# Patient Record
Sex: Female | Born: 1950 | Race: White | Hispanic: No | Marital: Married | Smoking: Never smoker
Health system: Southern US, Community
[De-identification: ages and names within clinical notes are randomized; demographics above are authoritative.]

## PROBLEM LIST (undated history)

## (undated) DIAGNOSIS — J329 Chronic sinusitis, unspecified: Secondary | ICD-10-CM

## (undated) DIAGNOSIS — IMO0001 Reserved for inherently not codable concepts without codable children: Secondary | ICD-10-CM

## (undated) DIAGNOSIS — G40109 Localization-related (focal) (partial) symptomatic epilepsy and epileptic syndromes with simple partial seizures, not intractable, without status epilepticus: Secondary | ICD-10-CM

## (undated) DIAGNOSIS — T451X5A Adverse effect of antineoplastic and immunosuppressive drugs, initial encounter: Secondary | ICD-10-CM

## (undated) DIAGNOSIS — C799 Secondary malignant neoplasm of unspecified site: Secondary | ICD-10-CM

## (undated) DIAGNOSIS — C50919 Malignant neoplasm of unspecified site of unspecified female breast: Secondary | ICD-10-CM

## (undated) DIAGNOSIS — IMO0002 Reserved for concepts with insufficient information to code with codable children: Secondary | ICD-10-CM

## (undated) DIAGNOSIS — C719 Malignant neoplasm of brain, unspecified: Secondary | ICD-10-CM

## (undated) DIAGNOSIS — M1990 Unspecified osteoarthritis, unspecified site: Secondary | ICD-10-CM

## (undated) DIAGNOSIS — D61810 Antineoplastic chemotherapy induced pancytopenia: Secondary | ICD-10-CM

## (undated) DIAGNOSIS — K219 Gastro-esophageal reflux disease without esophagitis: Secondary | ICD-10-CM

## (undated) DIAGNOSIS — F419 Anxiety disorder, unspecified: Secondary | ICD-10-CM

## (undated) DIAGNOSIS — Z9889 Other specified postprocedural states: Secondary | ICD-10-CM

## (undated) DIAGNOSIS — R112 Nausea with vomiting, unspecified: Secondary | ICD-10-CM

## (undated) HISTORY — DX: Gastro-esophageal reflux disease without esophagitis: K21.9

## (undated) HISTORY — DX: Unspecified osteoarthritis, unspecified site: M19.90

## (undated) HISTORY — DX: Malignant neoplasm of unspecified site of unspecified female breast: C50.919

## (undated) HISTORY — DX: Reserved for concepts with insufficient information to code with codable children: IMO0002

## (undated) HISTORY — DX: Reserved for inherently not codable concepts without codable children: IMO0001

## (undated) HISTORY — DX: Malignant neoplasm of brain, unspecified: C71.9

---

## 1983-05-16 HISTORY — PX: TUBAL LIGATION: SHX77

## 2003-08-25 ENCOUNTER — Encounter: Admission: RE | Admit: 2003-08-25 | Discharge: 2003-08-25 | Payer: Self-pay | Admitting: Internal Medicine

## 2003-12-01 ENCOUNTER — Encounter: Admission: RE | Admit: 2003-12-01 | Discharge: 2004-02-29 | Payer: Self-pay | Admitting: Internal Medicine

## 2010-06-05 ENCOUNTER — Encounter: Payer: Self-pay | Admitting: Internal Medicine

## 2011-12-18 ENCOUNTER — Encounter (HOSPITAL_COMMUNITY): Payer: Self-pay | Admitting: Emergency Medicine

## 2011-12-18 ENCOUNTER — Emergency Department (HOSPITAL_COMMUNITY)
Admission: EM | Admit: 2011-12-18 | Discharge: 2011-12-18 | Disposition: A | Discharge status: Home / Self Care | Payer: BC Managed Care – PPO | Source: Home / Self Care | Attending: Emergency Medicine | Admitting: Emergency Medicine

## 2011-12-18 DIAGNOSIS — J020 Streptococcal pharyngitis: Secondary | ICD-10-CM

## 2011-12-18 HISTORY — DX: Chronic sinusitis, unspecified: J32.9

## 2011-12-18 LAB — POCT RAPID STREP A: Streptococcus, Group A Screen (Direct): POSITIVE — AB

## 2011-12-18 MED ORDER — HYDROCODONE-ACETAMINOPHEN 5-325 MG PO TABS: 2.0000 | ORAL_TABLET | ORAL | Status: AC | PRN

## 2011-12-18 MED ORDER — CLINDAMYCIN HCL 300 MG PO CAPS: 300.0000 mg | ORAL_CAPSULE | Freq: Four times a day (QID) | ORAL | Status: AC

## 2011-12-18 MED ORDER — IBUPROFEN 600 MG PO TABS: 600.0000 mg | ORAL_TABLET | Freq: Four times a day (QID) | ORAL | Status: AC | PRN

## 2011-12-18 MED ORDER — ACETAMINOPHEN 325 MG PO TABS
ORAL_TABLET | ORAL | Status: AC
  Filled 2011-12-18: qty 2

## 2011-12-18 MED ORDER — ACETAMINOPHEN 325 MG PO TABS
650.0000 mg | ORAL_TABLET | Freq: Once | ORAL | Status: AC
  Administered 2011-12-18: 650 mg via ORAL

## 2011-12-18 NOTE — ED Notes (Signed)
Woke early this am with fever and sore throat.  Reports headache yesterday evening.

## 2011-12-18 NOTE — ED Provider Notes (Signed)
History     CSN: [id number]  Arrival date & time 12/18/11  1225   First MD Initiated Contact with Patient 12/18/11 1227      Chief Complaint  Patient presents with  . Fever    (Consider location/radiation/quality/duration/timing/severity/associated sxs/prior treatment) HPI Comments: Patient reports sore throat, headache, malaise, fatigue, fevers Tmax 101.3 at home. No nausea, vomiting, trismus, voice changes, drooling, sensation of throat swelling shut. Reports some pain reading up and her right ear, no change in hearing. No URI or reflux-like symptoms. Patient states that her grandson had URI-like symptoms last week.   ROS as noted in HPI. All other ROS negative.   Patient is a 61 y.o. female presenting with fever and pharyngitis. No language interpreter was used.  Fever Primary symptoms of the febrile illness include fever and headaches. Primary symptoms do not include shortness of breath or abdominal pain. The current episode started yesterday. This is a new problem. The problem has been gradually worsening.  Sore Throat This is a new problem. The current episode started 6 to 12 hours ago. The problem occurs constantly. The problem has been gradually worsening. Associated symptoms include headaches. Pertinent negatives include no chest pain, no abdominal pain and no shortness of breath. The symptoms are aggravated by swallowing. Nothing relieves the symptoms. She has tried acetaminophen for the symptoms. The treatment provided mild relief.    Past Medical History  Diagnosis Date  . Recurrent sinus infections     Past Surgical History  Procedure Date  . Tubal ligation     History reviewed. No pertinent family history.  History  Substance Use Topics  . Smoking status: Never Smoker   . Smokeless tobacco: Not on file  . Alcohol Use: Yes    OB History    Grav Para Term Preterm Abortions TAB SAB Ect Mult Living                  Review of Systems  Constitutional:  Positive for fever.  Respiratory: Negative for shortness of breath.   Cardiovascular: Negative for chest pain.  Gastrointestinal: Negative for abdominal pain.  Neurological: Positive for headaches.    Allergies  Codeine and Penicillins  Home Medications   Current Outpatient Rx  Name Route Sig Dispense Refill  . OMEGA-3 FATTY ACIDS 1000 MG PO CAPS Oral Take 2 g by mouth daily.    Marland Kitchen MAGNESIUM 30 MG PO TABS Oral Take 30 mg by mouth 2 (two) times daily.    Marland Kitchen ONE-DAILY MULTI VITAMINS PO TABS Oral Take 1 tablet by mouth daily.    Marland Kitchen CLINDAMYCIN HCL 300 MG PO CAPS Oral Take 1 capsule (300 mg total) by mouth 4 (four) times daily. X 10 days 40 capsule 0  . HYDROCODONE-ACETAMINOPHEN 5-325 MG PO TABS Oral Take 2 tablets by mouth every 4 (four) hours as needed for pain. 20 tablet 0  . IBUPROFEN 600 MG PO TABS Oral Take 1 tablet (600 mg total) by mouth every 6 (six) hours as needed for pain. 30 tablet 0    BP 118/63  Pulse 96  Temp 101.4 F (38.6 C) (Oral)  Resp 22  SpO2 98%  Physical Exam  Nursing note and vitals reviewed. Constitutional: She is oriented to person, place, and time. She appears well-developed and well-nourished. No distress.  HENT:  Head: Normocephalic and atraumatic.  Right Ear: Tympanic membrane normal.  Left Ear: Tympanic membrane normal.  Nose: Nose normal.  Mouth/Throat: Uvula is midline and mucous membranes are normal. Oropharyngeal  exudate, posterior oropharyngeal edema and posterior oropharyngeal erythema present. No tonsillar abscesses.  Eyes: Conjunctivae and EOM are normal.  Neck: Normal range of motion.  Cardiovascular: Normal rate and regular rhythm.   Pulmonary/Chest: Effort normal and breath sounds normal.  Abdominal: She exhibits no distension.  Musculoskeletal: Normal range of motion.  Lymphadenopathy:    She has cervical adenopathy.  Neurological: She is alert and oriented to person, place, and time. Coordination normal.  Skin: Skin is warm and dry.   Psychiatric: She has a normal mood and affect. Her behavior is normal. Judgment and thought content normal.    ED Course  Procedures (including critical care time)  Labs Reviewed  POCT RAPID STREP A ([facility] ONLY) - Abnormal; Notable for the following:    Streptococcus, Group A Screen (Direct) POSITIVE (*)     All other components within normal limits  POCT RAPID STREP A ([facility] ONLY)   No results found.   1. Strep throat       MDM  Rapid strep positive. Sending home with clindamycin for 10 days given penicillin allergy. Home with ibuprofen, Tylenol, Norco. Patient to followup with PMD when necessary. Discussed signs and symptoms that should prompt a return to department.   Luiz Blare, MD 12/18/11 [phone]

## 2011-12-18 NOTE — ED Notes (Signed)
Family at bedside. Repositioned exam table for comfort.  Provided pillow.  Denied any requests on exiting treatment room

## 2012-12-17 ENCOUNTER — Other Ambulatory Visit: Payer: Self-pay | Admitting: Internal Medicine

## 2012-12-17 DIAGNOSIS — N63 Unspecified lump in unspecified breast: Secondary | ICD-10-CM

## 2012-12-24 ENCOUNTER — Other Ambulatory Visit: Payer: Self-pay | Admitting: Radiology

## 2012-12-24 DIAGNOSIS — C50911 Malignant neoplasm of unspecified site of right female breast: Secondary | ICD-10-CM

## 2012-12-25 ENCOUNTER — Telehealth: Payer: Self-pay | Admitting: *Deleted

## 2012-12-25 DIAGNOSIS — C50411 Malignant neoplasm of upper-outer quadrant of right female breast: Secondary | ICD-10-CM | POA: Insufficient documentation

## 2012-12-25 NOTE — Telephone Encounter (Signed)
Called and spoke with patient and confirmed [facility] appt. For 01/01/13 at 0800.  No questions or concerns at this time.  Instructions and contact information given.

## 2012-12-27 ENCOUNTER — Ambulatory Visit
Admission: RE | Admit: 2012-12-27 | Discharge: 2012-12-27 | Disposition: A | Discharge status: Home / Self Care | Payer: BC Managed Care – PPO | Source: Ambulatory Visit | Attending: Radiology | Admitting: Radiology

## 2012-12-27 DIAGNOSIS — C50911 Malignant neoplasm of unspecified site of right female breast: Secondary | ICD-10-CM

## 2012-12-27 MED ORDER — GADOBENATE DIMEGLUMINE 529 MG/ML IV SOLN
12.0000 mL | Freq: Once | INTRAVENOUS | Status: AC | PRN
  Administered 2012-12-27: 12 mL via INTRAVENOUS

## 2013-01-01 ENCOUNTER — Encounter: Payer: Self-pay | Admitting: Oncology

## 2013-01-01 ENCOUNTER — Ambulatory Visit (HOSPITAL_BASED_OUTPATIENT_CLINIC_OR_DEPARTMENT_OTHER): Payer: BC Managed Care – PPO | Admitting: General Surgery

## 2013-01-01 ENCOUNTER — Ambulatory Visit (HOSPITAL_BASED_OUTPATIENT_CLINIC_OR_DEPARTMENT_OTHER): Payer: BC Managed Care – PPO | Admitting: Oncology

## 2013-01-01 ENCOUNTER — Ambulatory Visit
Admission: RE | Admit: 2013-01-01 | Discharge: 2013-01-01 | Disposition: A | Discharge status: Home / Self Care | Payer: BC Managed Care – PPO | Source: Ambulatory Visit | Attending: Radiation Oncology | Admitting: Radiation Oncology

## 2013-01-01 ENCOUNTER — Encounter: Payer: Self-pay | Admitting: *Deleted

## 2013-01-01 ENCOUNTER — Ambulatory Visit: Payer: BC Managed Care – PPO | Attending: General Surgery | Admitting: Physical Therapy

## 2013-01-01 ENCOUNTER — Telehealth: Payer: Self-pay | Admitting: Oncology

## 2013-01-01 ENCOUNTER — Other Ambulatory Visit (HOSPITAL_BASED_OUTPATIENT_CLINIC_OR_DEPARTMENT_OTHER): Payer: BC Managed Care – PPO | Admitting: Lab

## 2013-01-01 ENCOUNTER — Encounter (INDEPENDENT_AMBULATORY_CARE_PROVIDER_SITE_OTHER): Payer: Self-pay | Admitting: General Surgery

## 2013-01-01 ENCOUNTER — Ambulatory Visit: Payer: BC Managed Care – PPO

## 2013-01-01 VITALS — BP 149/70 | HR 87 | Temp 98.6°F | Resp 20 | Ht 63.0 in | Wt 137.9 lb

## 2013-01-01 DIAGNOSIS — Z171 Estrogen receptor negative status [ER-]: Secondary | ICD-10-CM

## 2013-01-01 DIAGNOSIS — C50411 Malignant neoplasm of upper-outer quadrant of right female breast: Secondary | ICD-10-CM

## 2013-01-01 DIAGNOSIS — C50911 Malignant neoplasm of unspecified site of right female breast: Secondary | ICD-10-CM

## 2013-01-01 DIAGNOSIS — C50419 Malignant neoplasm of upper-outer quadrant of unspecified female breast: Secondary | ICD-10-CM

## 2013-01-01 DIAGNOSIS — C50919 Malignant neoplasm of unspecified site of unspecified female breast: Secondary | ICD-10-CM

## 2013-01-01 DIAGNOSIS — Z01818 Encounter for other preprocedural examination: Secondary | ICD-10-CM | POA: Insufficient documentation

## 2013-01-01 DIAGNOSIS — IMO0001 Reserved for inherently not codable concepts without codable children: Secondary | ICD-10-CM | POA: Insufficient documentation

## 2013-01-01 LAB — CBC WITH DIFFERENTIAL/PLATELET
BASO%: 0.8 % (ref 0.0–2.0)
Basophils Absolute: 0.1 10*3/uL (ref 0.0–0.1)
Eosinophils Absolute: 0.3 10*3/uL (ref 0.0–0.5)
LYMPH%: 18.4 % (ref 14.0–49.7)
MCHC: 33.6 g/dL (ref 31.5–36.0)
MCV: 89.9 fL (ref 79.5–101.0)
MONO%: 5.5 % (ref 0.0–14.0)
NEUT#: 6.1 10*3/uL (ref 1.5–6.5)
Platelets: 264 10*3/uL (ref 145–400)
WBC: 8.5 10*3/uL (ref 3.9–10.3)

## 2013-01-01 LAB — COMPREHENSIVE METABOLIC PANEL (CC13)
ALT: 17 U/L (ref 0–55)
Alkaline Phosphatase: 101 U/L (ref 40–150)
Creatinine: 0.8 mg/dL (ref 0.6–1.1)
Glucose: 100 mg/dl (ref 70–140)
Sodium: 144 mEq/L (ref 136–145)
Total Protein: 7.9 g/dL (ref 6.4–8.3)

## 2013-01-01 NOTE — Progress Notes (Signed)
ID: Carrie Berry OB: [birth year]  MR#: [id number]  CSN#:[id number]  PCP: Katy Apo, MD GYN:   SU: Avel Peace OTHER MD: Lurline Hare, Danise Edge, [place] Cornella   HISTORY OF PRESENT ILLNESS: Carrie Berry noted a mass in her right breast December of 2013, but did not think much of it. It did grow some lower the summer, but she was keeping her grandchild at that time and was too busy so she did not bring it to Dr. Lynnette Caffey attention until August. He set her up for bilateral mammography and ultrasonography at [facility] 12/18/2012 and this measured, on the right, a large irregular lobulated mass in the upper outer quadrant which by ultrasound measured 5.7 cm. The right axilla showed some lymph nodes with thickened cortices. In the left breast there was a focal asymmetry at the depth, lateral to the nipple, and by ultrasound there was a 9 mm ill-defined hypoechoic lesion in this location. Biopsy of the left breast lesion showed only fibrocystic changes.  Biopsy of the right breast mass and right axillary adenopathy ([id number]) on 12/23/2012 showed both to be involved by invasive ductal carcinoma, grade 2, triple negative, with an MIB-1 of 86%.  MRI of the breast 12/27/2012 at [facility] imaging showed in the right breast a mass abutting the pectoralis muscle without enhancement of the muscle measuring 4.8 cm. The satellite nodule measuring approximately 3 mm was also noted superior to the mass and several abnormal and enlarged right axillary lymph nodes were noted, one of which appeared to be necrotic. The largest node measured 2.0 cm. Unfortunately, numerous bilateral pulmonary nodules were also noted.  The patient's subsequent history is as detailed below   INTERVAL HISTORY: Carrie Berry was seen in the multidisciplinary breast cancer clinic 01/01/2013 accompanied by her husband Carrie Berry.  REVIEW OF SYSTEMS: She sleeps poorly, has gained a little weight recently, and has mild arthritis pains here in there  which are not more intense or persistent than usual. Aside from this, she has had the mass and some discomfort in the right breast. A detailed review of systems today was otherwise entirely negative and in particular she denied headaches, visual changes, nausea, vomiting, balance issues, cough, phlegm production, pleurisy, shortness of breath, chest pain or pressure, or change in bowel or bladder habits.  PAST MEDICAL HISTORY: Past Medical History  Diagnosis Date  . Recurrent sinus infections   . Arthritis     PAST SURGICAL HISTORY: Past Surgical History  Procedure Laterality Date  . Tubal ligation    . Tubal ligation  1985    FAMILY HISTORY Family History  Problem Relation Age of Onset  . Bladder Cancer Father   . Colon cancer Maternal Grandmother    the patient's parents are living, both in their 16s. The patient's father was diagnosed with bladder cancer the age of 78. The patient's mother's mother had some type of gastrointestinal cancer. The patient had one brother, no sisters. There is no history of breast or ovarian cancer in the family other than a cousin on the father's side who was diagnosed with breast cancer apparently before the age of 25.  GYNECOLOGIC HISTORY:  Menarche age 41, first live birth age 53, the patient is GX P2. She went through menopause in 2008. She did not take hormone replacement. She is birth control for approximately 22 years remotely.  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 Her husband Carrie Berry Carrie Berry"), is that vice president  of manufacturing for [employer]. the patient's daughter and Carrie Berry is a stay-at-home mom in [place], the patient's son Carrie Berry unfortunately died in an automobile accident in December of 2011. The patient has 2 grandchildren. She attends to have an echo in [place].    ADVANCED DIRECTIVES: Not in place   HEALTH  MAINTENANCE: History  Substance Use Topics  . Smoking status: Never Smoker   . Smokeless tobacco: Not on file  . Alcohol Use: Yes     Comment: 1/2 glass 2-3 times per week     Colonoscopy: 2005  PAP:  Bone density:  Lipid panel:  Allergies  Allergen Reactions  . Codeine   . Penicillins     Rash in childhood    Current Outpatient Prescriptions  Medication Sig Dispense Refill  . aspirin 81 MG tablet Take 81 mg by mouth daily.      . cetirizine (ZYRTEC) 10 MG tablet Take 10 mg by mouth daily.      . fish oil-omega-3 fatty acids 1000 MG capsule Take 2 g by mouth daily.      . Magnesium-Calcium-Folic Acid (MAGNEBIND 400 PO) Take 1 tablet by mouth daily.      . Multiple Vitamin (MULTIVITAMIN) tablet Take 1 tablet by mouth daily.       No current facility-administered medications for this visit.    OBJECTIVE: Middle-aged white woman who appears well Filed Vitals:   01/01/13 0846  BP: 149/70  Pulse: 87  Temp: 98.6 F (37 C)  Resp: 20     Body mass index is 24.43 kg/(m^2).    ECOG FS: 0  Sclerae unicteric Oropharynx clear No cervical or supraclavicular adenopathy Lungs no rales or rhonchi Heart regular rate and rhythm Abd benign MSK no focal spinal tenderness, no peripheral edema Neuro: non-focal, well-oriented, appropriate affect Breasts: There is a large palpable, movable mass in the right breast. I do not palpate obvious right axillary adenopathy. I do not palpate any mass in the left breast.   LAB RESULTS:  CMP     Component Value Date/Time   NA 144 01/01/2013 0750   K 4.3 01/01/2013 0750   CO2 27 01/01/2013 0750   GLUCOSE 100 01/01/2013 0750   BUN 16.6 01/01/2013 0750   CREATININE 0.8 01/01/2013 0750   CALCIUM 9.9 01/01/2013 0750   PROT 7.9 01/01/2013 0750   ALBUMIN 3.8 01/01/2013 0750   AST 18 01/01/2013 0750   ALT 17 01/01/2013 0750   ALKPHOS 101 01/01/2013 0750   BILITOT 0.59 01/01/2013 0750    I No results found for this basename: SPEP, UPEP,  kappa and  lambda light chains    Lab Results  Component Value Date   WBC 8.5 01/01/2013   NEUTROABS 6.1 01/01/2013   HGB 13.3 01/01/2013   HCT 39.5 01/01/2013   MCV 89.9 01/01/2013   PLT 264 01/01/2013      Chemistry      Component Value Date/Time   NA 144 01/01/2013 0750   K 4.3 01/01/2013 0750   CO2 27 01/01/2013 0750   BUN 16.6 01/01/2013 0750   CREATININE 0.8 01/01/2013 0750      Component Value Date/Time   CALCIUM 9.9 01/01/2013 0750   ALKPHOS 101 01/01/2013 0750   AST 18 01/01/2013 0750   ALT 17 01/01/2013 0750   BILITOT 0.59 01/01/2013 0750       No results found for this basename: LABCA2    No components found with this basename: [id number]  No results found for this basename: INR,  in the last 168 hours  Urinalysis No results found for this basename: colorurine, appearanceur, labspec, phurine, glucoseu, hgbur, bilirubinur, ketonesur, proteinur, urobilinogen, nitrite, leukocytesur    STUDIES: Mr Breast Bilateral W Wo Contrast  12/30/2012   *RADIOLOGY REPORT*  Clinical Data:  Patient with recent diagnosis of right invasive ductal carcinoma with positive lymph node.  MRI LEFT BREAST WITHOUT AND WITH CONTRAST  Technique: Multiplanar, multisequence MR images of the bilateral breasts were obtained prior to and following the intravenous administration of 12ml of Multihance.  Comparison:  Recent imaging examinations.  FINDINGS:  Breast composition:  c:  Heterogeneous fibroglandular tissue  Background parenchymal enhancement:  Moderate  Left breast:  Non mass enhancement is identified in the upper outer quadrant of the left breast posteriorly, measuring 1.5 x 1.4 x 1 cm.  This corresponds with the area of previous biopsy and likely represents post procedure changes.  Enhancing focus measuring 3 x 3 x 4 mm is identified in the upper central left breast.  Right breast: Centrally necrotic mass is identified in the outer right breast spanning the upper and lower portions.  The mass abuts the  pectoralis major muscle without muscle enhancement to indicate invasion. The majority of the mass measures 4.6 x 4.8 x 4.7 cm. Additionally, there is a band of enhancement extending from the mass  to the nipple with associated nipple retraction. A 3 x 2 x 2 mm focus of enhancement is identified approximately 3 mm superior to the mass, likely representing a satellite nodule.  Lymph nodes:  Abnormal and enlarged right axillary lymph nodes are identified.  One lymph node appears necrotic. The largest node measures 2 x 1.7 x 1.1 cm.  Ancillary findings:  Numerous bilateral pulmonary nodules.  IMPRESSION: 1. A 4.6 x 4.8 x 4.7 cm necrotic mass in the outer right breast, spanning the upper and lower quadrants. Abnormal enhancement from the mass extends anteriorly to the nipple with associated nipple retraction.  No evidence of chest wall invasion. Small associated satellite nodule.  2. Right axillary adenopathy, with one lymph node appearing necrotic.  3. Enhancing left breast focus, measuring up to 4mm.  4. Numerous bilateral pulmonary nodules.  5. Non mass enhancement in the upper outer left breast, likely correlating with postprocedure changes.  RECOMMENDATION: Staging CT, given MRI apparent pulmonary nodules. The enhancing focus in the left breast can be followed after treatment. If clinically appropriate, MRI guided biopsy of this focus can be performed.  BI-RADS CATEGORY 6:  Known biopsy-proven malignancy - appropriate action should be taken.  THREE-DIMENSIONAL MR IMAGE RENDERING ON INDEPENDENT WORKSTATION:  Three-dimensional MR images were rendered by post-processing of the original MR data on a DynaCad workstation.  The three-dimensional MR images were interpreted, and findings were reported in the accompanying complete MRI report for this study.   Original Report Authenticated By: Jerene Dilling, M.D.    ASSESSMENT: 62 y.o. Carrie Berry, [place] woman status post right breast upper outer quadrant and right axillary  lymph node biopsy 12/23/2012 for a clinical T3 N1 M1, stage IV invasive ductal carcinoma, grade 3, triple negative, with an MIB-1 of 86%.  PLAN: We spent the better part of today's hour-long visit discussing the biology of breast cancer in general and the specifics of Anique's breast cancer in particular. She understands that stage IV breast cancer is not curable with her current knowledge base. The goal of treatment is control. We do need to document that indeed we are dealing  with stage IV cancer so she is being set up for a PET scan and CT of the chest and depending on those results we will obtain a biopsy from the most easily accessible lesion.  We also discussed that since her tumor is triple negative, her only systemic treatment option is chemotherapy. Generally we treat with one drug that time, and frequently we use Taxol or Abraxane to start with, although there are many choices. We will make that decision depending on the results of her staging scans when she returns to see me on 01/21/2013.  We also discussed port placement, and this is being operationalized. I am hopeful that once we can get the systemic disease under control she may be able to undergo as salvage mastectomy for optimal local control. The patient knows to call for any problems that may develop before her next visit here. Lowella Dell, MD   01/01/2013 12:46 PM

## 2013-01-01 NOTE — Progress Notes (Signed)
[facility] Psychosocial Distress Screening Clinical Social Work  Patient completed distress screening protocol, and scored a 5 on the Psychosocial Distress Thermometer which indicates moderate distress. Clinical Social Worker met with pt in [facility] to assess for distress and other psychosocial needs.  Pt expressed feeling overwhelmed, but felt "better" having more information and a treatment plan.  CSW provided pt with information on the support team and resources at [facility].  Pt was open to support services and agreeable an alight guide referral.  CSW provided pt with a patient and family support calendar and encouraged her to call with any questions or concerns.    Tamala Julian, MSW, LCSW Clinical Social Worker [facility] [phone]

## 2013-01-01 NOTE — Progress Notes (Signed)
Radiation Oncology         [phone]) [phone] ________________________________  Initial outpatient Consultation - Date: 01/01/2013   Name: Carrie Berry MRN: [id number]   DOB: Jul 20, 1950  REFERRING PHYSICIAN: Adolph Pollack, MD  DIAGNOSIS: [id number] triple negarive right breast cancer   HISTORY OF PRESENT ILLNESS::Carrie Berry is a 62 y.o. female a right breast mass for months ago. When it did not resolve she presented for her imaging. A mass was seen in the upper outer quadrant of the right breast. This was confirmed on MRI scan where abnormal enhancement extended up to the nipple and the mass was noted to abut did not invade the chest wall. An abnormal lymph node was apparent on the right as well. Biopsy was performed of the right breast mass as well as the right lymph node. Both were positive for triple negative invasive ductal carcinoma. An abnormality was seen in the left breast which was consistent with fibrocystic change and calcifications. The right breast mass and lymph nodes were noted to be again triple negative with a Ki-67 of 86%. On MRI multiple pulmonology were noted and staging workup was recommended. She is recovered well from her biopsy. She is accompanied by her husband today. She has no history of breast cancer. She is GX P2 with her first live birth at 65. She is postmenopausal. She has a family history of malignancy with her grandmother having intestinal cancer of some sort and her father having bladder cancer.  PREVIOUS RADIATION THERAPY: No  PAST MEDICAL HISTORY:  has a past medical history of Recurrent sinus infections and Arthritis.    PAST SURGICAL HISTORY: Past Surgical History  Procedure Laterality Date  . Tubal ligation    . Tubal ligation  1985    FAMILY HISTORY:  Family History  Problem Relation Age of Onset  . Bladder Cancer Father   . Colon cancer Maternal Grandmother     SOCIAL HISTORY:  History  Substance Use Topics  . Smoking status: Never Smoker     . Smokeless tobacco: Not on file  . Alcohol Use: Yes     Comment: 1/2 glass 2-3 times per week    ALLERGIES: Codeine and Penicillins  MEDICATIONS:  Current Outpatient Prescriptions  Medication Sig Dispense Refill  . aspirin 81 MG tablet Take 81 mg by mouth daily.      . cetirizine (ZYRTEC) 10 MG tablet Take 10 mg by mouth daily.      . fish oil-omega-3 fatty acids 1000 MG capsule Take 2 g by mouth daily.      . Magnesium-Calcium-Folic Acid (MAGNEBIND 400 PO) Take 1 tablet by mouth daily.      . Multiple Vitamin (MULTIVITAMIN) tablet Take 1 tablet by mouth daily.       No current facility-administered medications for this encounter.    REVIEW OF SYSTEMS:  A 15 point review of systems is documented in the electronic medical record. This was obtained by the nursing staff. However, I reviewed this with the patient to discuss relevant findings and make appropriate changes.  Pertinent items are noted in HPI.  PHYSICAL EXAM: There were no vitals filed for this visit.. . Pleasant female in no distress sitting comfortably examining table. She is alert and oriented x3. ECoG performance status 0. No palpable cervical or subclavicular adenopathy is no palpable axillary adenopathy bilaterally. The left breast is negative except for a small ecchymoses where her biopsy was performed. She has a hard fixed right breast mass over the  upper outer quadrant of the right breast extending to about the 9:00 position and up into the 11:00 position. There is no pain with palpation. She is alert and oriented x3. Her grip is 5 out of 5 in her bilateral upper extremities.  LABORATORY DATA:  Lab Results  Component Value Date   WBC 8.5 01/01/2013   HGB 13.3 01/01/2013   HCT 39.5 01/01/2013   MCV 89.9 01/01/2013   PLT 264 01/01/2013   Lab Results  Component Value Date   NA 144 01/01/2013   K 4.3 01/01/2013   CO2 27 01/01/2013   Lab Results  Component Value Date   ALT 17 01/01/2013   AST 18 01/01/2013   ALKPHOS 101  01/01/2013   BILITOT 0.59 01/01/2013     RADIOGRAPHY: Mr Breast Bilateral W Wo Contrast  12/30/2012   *RADIOLOGY REPORT*  Clinical Data:  Patient with recent diagnosis of right invasive ductal carcinoma with positive lymph node.  MRI LEFT BREAST WITHOUT AND WITH CONTRAST  Technique: Multiplanar, multisequence MR images of the bilateral breasts were obtained prior to and following the intravenous administration of 12ml of Multihance.  Comparison:  Recent imaging examinations.  FINDINGS:  Breast composition:  c:  Heterogeneous fibroglandular tissue  Background parenchymal enhancement:  Moderate  Left breast:  Non mass enhancement is identified in the upper outer quadrant of the left breast posteriorly, measuring 1.5 x 1.4 x 1 cm.  This corresponds with the area of previous biopsy and likely represents post procedure changes.  Enhancing focus measuring 3 x 3 x 4 mm is identified in the upper central left breast.  Right breast: Centrally necrotic mass is identified in the outer right breast spanning the upper and lower portions.  The mass abuts the pectoralis major muscle without muscle enhancement to indicate invasion. The majority of the mass measures 4.6 x 4.8 x 4.7 cm. Additionally, there is a band of enhancement extending from the mass  to the nipple with associated nipple retraction. A 3 x 2 x 2 mm focus of enhancement is identified approximately 3 mm superior to the mass, likely representing a satellite nodule.  Lymph nodes:  Abnormal and enlarged right axillary lymph nodes are identified.  One lymph node appears necrotic. The largest node measures 2 x 1.7 x 1.1 cm.  Ancillary findings:  Numerous bilateral pulmonary nodules.  IMPRESSION: 1. A 4.6 x 4.8 x 4.7 cm necrotic mass in the outer right breast, spanning the upper and lower quadrants. Abnormal enhancement from the mass extends anteriorly to the nipple with associated nipple retraction.  No evidence of chest wall invasion. Small associated satellite  nodule.  2. Right axillary adenopathy, with one lymph node appearing necrotic.  3. Enhancing left breast focus, measuring up to 4mm.  4. Numerous bilateral pulmonary nodules.  5. Non mass enhancement in the upper outer left breast, likely correlating with postprocedure changes.  RECOMMENDATION: Staging CT, given MRI apparent pulmonary nodules. The enhancing focus in the left breast can be followed after treatment. If clinically appropriate, MRI guided biopsy of this focus can be performed.  BI-RADS CATEGORY 6:  Known biopsy-proven malignancy - appropriate action should be taken.  THREE-DIMENSIONAL MR IMAGE RENDERING ON INDEPENDENT WORKSTATION:  Three-dimensional MR images were rendered by post-processing of the original MR data on a DynaCad workstation.  The three-dimensional MR images were interpreted, and findings were reported in the accompanying complete MRI report for this study.   Original Report Authenticated By: Jerene Dilling, M.D.  IMPRESSION: Possibly metastatic right breast cancer  PLAN: I briefly discussed with Carrie Berry the logistics of radiation. We discussed that radiation is a local treatment use to either decrease the risk for local failure or to treat symptomatic metastatic sites. At this point she has further workup pending. The current data does not recommend postmastectomy radiation in the setting of metastatic disease. I let her and her husband that I would be happy to meet with them after her chemotherapy and possible surgery are complete or if any symptomatic areas, in the interim. She is seen surgery and medical oncology was a member of our patient family support staff in her physical therapist. A followup with her we'll be on an as-needed basis  I spent 30 minutes  face to face with the patient and more than 50% of that time was spent in counseling and/or coordination of care.   ------------------------------------------------  Lurline Hare, MD

## 2013-01-01 NOTE — Patient Instructions (Signed)
My office will call about scheduling the Port-A-Cath insertion.

## 2013-01-01 NOTE — Progress Notes (Signed)
Patient ID: Carrie Berry, female   DOB: 03/24/1951, 62 y.o.   MRN: [id number]  No chief complaint on file.   HPI Carrie Berry is a 62 y.o. female.   HPI  She is referred by Dr. Cornella for further evaluation and treatment of a newly diagnosed triple negative invasive ductal carcinoma the right breast. Proliferation rate is 86%. She noticed a right breast mass 8 months ago that has been progressively increasing in size. There is some discomfort and some lateral nipple deviation as well. She went for mammogram and ultrasound. The lesion was noted at the 9:00 position. An abnormal lymph node was noted as well. There is also a lesion noted at the 3:00 position of the left breast. The lesion measured 5.7 cm on ultrasound. Image guided biopsy of the right breast mass as well as the right axillary lymph node were positive for invasive ductal carcinoma. Image guided biopsy of the left breast abnormality demonstrated fibrocystic changes with no malignancy.  MRI was performed. The mass is 4.8 cm on MRI. It goes down to the chest wall and rises up to the nipple area. Pulmonary nodules were noted on the MRI as well.  She has a cousin on her father's side who had breast cancer. Age at first menstrual period was 14. Age at first live birth was 28. No hormone replacement therapy.    She is here with her husband.  Past Medical History  Diagnosis Date  . Recurrent sinus infections   . Arthritis     Past Surgical History  Procedure Laterality Date  . Tubal ligation    . Tubal ligation  1985    Family History  Problem Relation Age of Onset  . Bladder Cancer Father   . Colon cancer Maternal Grandmother     Social History History  Substance Use Topics  . Smoking status: Never Smoker   . Smokeless tobacco: Not on file  . Alcohol Use: Yes     Comment: 1/2 glass 2-3 times per week    Allergies  Allergen Reactions  . Codeine   . Penicillins     Rash in childhood    Current Outpatient  Prescriptions  Medication Sig Dispense Refill  . aspirin 81 MG tablet Take 81 mg by mouth daily.      . cetirizine (ZYRTEC) 10 MG tablet Take 10 mg by mouth daily.      . fish oil-omega-3 fatty acids 1000 MG capsule Take 2 g by mouth daily.      . Magnesium-Calcium-Folic Acid (MAGNEBIND 400 PO) Take 1 tablet by mouth daily.      . Multiple Vitamin (MULTIVITAMIN) tablet Take 1 tablet by mouth daily.       No current facility-administered medications for this visit.    Review of Systems Review of Systems  Constitutional:       Weight loss.  HENT: Negative.   Respiratory: Negative.   Cardiovascular: Negative.   Gastrointestinal: Negative.   Endocrine: Negative.   Genitourinary: Negative.   Musculoskeletal: Negative.   Neurological: Negative.   Hematological: Negative.     There were no vitals taken for this visit.  Physical Exam Physical Exam  Constitutional: No distress.  Thin female.  HENT:  Head: Normocephalic and atraumatic.  Eyes: EOM are normal. No scleral icterus.  Neck: Neck supple.  No supraclavicular adenopathy.  Cardiovascular: Normal rate and regular rhythm.   Pulmonary/Chest: Effort normal and breath sounds normal.  5-6 cm fixed mass was some lateral nipple   deviation in the right breast at the 9:00 position.  Small puncture wound in the left breast. No dominant masses present. No suspicious skin changes in left breast.  Abdominal: Soft. She exhibits no distension and no mass. There is no tenderness.  Musculoskeletal: Normal range of motion. She exhibits no edema.  No axillary adenopathy.  Lymphadenopathy:    She has no cervical adenopathy.  Neurological: She is alert.  Skin: Skin is warm and dry.  Psychiatric: She has a normal mood and affect. Her behavior is normal.    Data Reviewed Mammogram, ultrasound, MRI, pathology results.  Assessment    Advanced invasive cancer of right breast with concern for pulmonary metastasis. We briefly discussed  surgery for this but I told her after her chemotherapy treatments I would see her and make a more definitive recommendation at that time. I did tell her it would probably be mastectomy. As for now she needs long-term venous access for chemotherapy.     Plan    Ultrasound-guided Port-A-Cath insertion.  The procedure risks and aftercare been explained. Risks include but are not limited to bleeding, infection, malfunction, pneumothorax, wound problems, DVT.        Toy Samarin J 01/01/2013, 11:56 AM    

## 2013-01-01 NOTE — Progress Notes (Signed)
Checked in new pt with no financial concerns. °

## 2013-01-06 ENCOUNTER — Encounter (HOSPITAL_COMMUNITY): Payer: Self-pay | Admitting: Pharmacy Technician

## 2013-01-07 ENCOUNTER — Telehealth: Payer: Self-pay | Admitting: *Deleted

## 2013-01-07 ENCOUNTER — Ambulatory Visit (HOSPITAL_COMMUNITY)
Admission: RE | Admit: 2013-01-07 | Discharge: 2013-01-07 | Disposition: A | Discharge status: Home / Self Care | Payer: BC Managed Care – PPO | Source: Ambulatory Visit | Attending: Oncology | Admitting: Oncology

## 2013-01-07 ENCOUNTER — Encounter (HOSPITAL_COMMUNITY)
Admission: RE | Admit: 2013-01-07 | Discharge: 2013-01-07 | Disposition: A | Discharge status: Home / Self Care | Payer: BC Managed Care – PPO | Source: Ambulatory Visit | Attending: Oncology | Admitting: Oncology

## 2013-01-07 ENCOUNTER — Encounter: Payer: Self-pay | Admitting: *Deleted

## 2013-01-07 ENCOUNTER — Other Ambulatory Visit: Payer: BC Managed Care – PPO

## 2013-01-07 DIAGNOSIS — C50919 Malignant neoplasm of unspecified site of unspecified female breast: Secondary | ICD-10-CM

## 2013-01-07 DIAGNOSIS — C7800 Secondary malignant neoplasm of unspecified lung: Secondary | ICD-10-CM | POA: Insufficient documentation

## 2013-01-07 DIAGNOSIS — R918 Other nonspecific abnormal finding of lung field: Secondary | ICD-10-CM | POA: Insufficient documentation

## 2013-01-07 DIAGNOSIS — R599 Enlarged lymph nodes, unspecified: Secondary | ICD-10-CM | POA: Insufficient documentation

## 2013-01-07 DIAGNOSIS — K7689 Other specified diseases of liver: Secondary | ICD-10-CM | POA: Insufficient documentation

## 2013-01-07 DIAGNOSIS — C773 Secondary and unspecified malignant neoplasm of axilla and upper limb lymph nodes: Secondary | ICD-10-CM | POA: Insufficient documentation

## 2013-01-07 LAB — GLUCOSE, CAPILLARY: Glucose-Capillary: 91 mg/dL (ref 70–99)

## 2013-01-07 MED ORDER — FLUDEOXYGLUCOSE F - 18 (FDG) INJECTION
17.2000 | Freq: Once | INTRAVENOUS | Status: AC | PRN
  Administered 2013-01-07: 17.2 via INTRAVENOUS

## 2013-01-07 MED ORDER — IOHEXOL 300 MG/ML  SOLN
80.0000 mL | Freq: Once | INTRAMUSCULAR | Status: AC | PRN
  Administered 2013-01-07: 80 mL via INTRAVENOUS

## 2013-01-07 NOTE — Telephone Encounter (Signed)
Left message for a return phone call to follow up from [facility] 01/01/13 and to confirm new appt with Dr. Darnelle Catalan.  Awaiting patient response.

## 2013-01-07 NOTE — Telephone Encounter (Signed)
Received call back from patient and confirmed appt for 01/14/13 at 430.  No questions or concerns at this time.

## 2013-01-08 ENCOUNTER — Encounter (HOSPITAL_COMMUNITY)
Admission: RE | Admit: 2013-01-08 | Discharge: 2013-01-08 | Disposition: A | Discharge status: Home / Self Care | Payer: BC Managed Care – PPO | Source: Ambulatory Visit | Attending: General Surgery | Admitting: General Surgery

## 2013-01-08 ENCOUNTER — Encounter (HOSPITAL_COMMUNITY): Payer: Self-pay

## 2013-01-08 HISTORY — DX: Anxiety disorder, unspecified: F41.9

## 2013-01-08 HISTORY — DX: Nausea with vomiting, unspecified: R11.2

## 2013-01-08 HISTORY — DX: Other specified postprocedural states: Z98.890

## 2013-01-08 LAB — CBC
HCT: 38.1 % (ref 36.0–46.0)
Hemoglobin: 13.1 g/dL (ref 12.0–15.0)
RBC: 4.24 MIL/uL (ref 3.87–5.11)
WBC: 11.3 10*3/uL — ABNORMAL HIGH (ref 4.0–10.5)

## 2013-01-08 LAB — BASIC METABOLIC PANEL
BUN: 18 mg/dL (ref 6–23)
CO2: 28 mEq/L (ref 19–32)
Chloride: 100 mEq/L (ref 96–112)
GFR calc non Af Amer: 77 mL/min — ABNORMAL LOW (ref >90)
Glucose, Bld: 90 mg/dL (ref 70–99)
Potassium: 4 mEq/L (ref 3.5–5.1)
Sodium: 138 mEq/L (ref 135–145)

## 2013-01-08 NOTE — Progress Notes (Signed)
Primary physician - dr. Nehemiah Settle Does not have a cardiologist No prior cardiac testing

## 2013-01-08 NOTE — Pre-Procedure Instructions (Signed)
Carrie Berry  01/08/2013   Your procedure is scheduled on:  Friday, August 29th  Report to [facility] at 0830 AM.  Call this number if you have problems the morning of surgery: [phone]   Remember:   Do not eat food or drink liquids after midnight.   Take these medicines the morning of surgery with A SIP OF WATER: tylenol if needed, zyrtec Stop aspirin, fish oil   Do not wear jewelry, make-up or nail polish.  Do not wear lotions, powders, or perfume, deodorant.  Do not shave 48 hours prior to surgery. Men may shave face and neck.  Do not bring valuables to the hospital.  [facility] is not responsible   for any belongings or valuables.  Contacts, dentures or bridgework may not be worn into surgery.  Leave suitcase in the car. After surgery it may be brought to your room.  For patients admitted to the hospital, checkout time is 11:00 AM the day of discharge.   Patients discharged the day of surgery will not be allowed to drive home.    Special Instructions: Shower using CHG 2 nights before surgery and the night before surgery.  If you shower the day of surgery use CHG.  Use special wash - you have one bottle of CHG for all showers.  You should use approximately 1/3 of the bottle for each shower.   Please read over the following fact sheets that you were given: Pain Booklet, Coughing and Deep Breathing and Surgical Site Infection Prevention

## 2013-01-09 MED ORDER — VANCOMYCIN HCL IN DEXTROSE 1-5 GM/200ML-% IV SOLN
1000.0000 mg | INTRAVENOUS | Status: AC
  Administered 2013-01-10: 1000 mg via INTRAVENOUS
  Filled 2013-01-09: qty 200

## 2013-01-10 ENCOUNTER — Other Ambulatory Visit (INDEPENDENT_AMBULATORY_CARE_PROVIDER_SITE_OTHER): Payer: Self-pay | Admitting: General Surgery

## 2013-01-10 ENCOUNTER — Ambulatory Visit (HOSPITAL_COMMUNITY): Payer: BC Managed Care – PPO

## 2013-01-10 ENCOUNTER — Encounter (HOSPITAL_COMMUNITY): Payer: Self-pay | Admitting: Anesthesiology

## 2013-01-10 ENCOUNTER — Ambulatory Visit (HOSPITAL_COMMUNITY): Payer: BC Managed Care – PPO | Admitting: Anesthesiology

## 2013-01-10 ENCOUNTER — Encounter (HOSPITAL_COMMUNITY)
Admission: RE | Disposition: A | Discharge status: Home / Self Care | Payer: Self-pay | Source: Ambulatory Visit | Attending: General Surgery

## 2013-01-10 ENCOUNTER — Encounter (HOSPITAL_COMMUNITY): Payer: Self-pay | Admitting: *Deleted

## 2013-01-10 ENCOUNTER — Ambulatory Visit (HOSPITAL_COMMUNITY)
Admission: RE | Admit: 2013-01-10 | Discharge: 2013-01-10 | Disposition: A | Discharge status: Home / Self Care | Payer: BC Managed Care – PPO | Source: Ambulatory Visit | Attending: General Surgery | Admitting: General Surgery

## 2013-01-10 DIAGNOSIS — Z79899 Other long term (current) drug therapy: Secondary | ICD-10-CM | POA: Insufficient documentation

## 2013-01-10 DIAGNOSIS — Z880 Allergy status to penicillin: Secondary | ICD-10-CM | POA: Insufficient documentation

## 2013-01-10 DIAGNOSIS — Z803 Family history of malignant neoplasm of breast: Secondary | ICD-10-CM | POA: Insufficient documentation

## 2013-01-10 DIAGNOSIS — M129 Arthropathy, unspecified: Secondary | ICD-10-CM | POA: Insufficient documentation

## 2013-01-10 DIAGNOSIS — C773 Secondary and unspecified malignant neoplasm of axilla and upper limb lymph nodes: Secondary | ICD-10-CM | POA: Insufficient documentation

## 2013-01-10 DIAGNOSIS — C50919 Malignant neoplasm of unspecified site of unspecified female breast: Secondary | ICD-10-CM | POA: Insufficient documentation

## 2013-01-10 DIAGNOSIS — Z7982 Long term (current) use of aspirin: Secondary | ICD-10-CM | POA: Insufficient documentation

## 2013-01-10 DIAGNOSIS — F411 Generalized anxiety disorder: Secondary | ICD-10-CM | POA: Insufficient documentation

## 2013-01-10 DIAGNOSIS — Z885 Allergy status to narcotic agent status: Secondary | ICD-10-CM | POA: Insufficient documentation

## 2013-01-10 DIAGNOSIS — Z171 Estrogen receptor negative status [ER-]: Secondary | ICD-10-CM | POA: Insufficient documentation

## 2013-01-10 HISTORY — PX: PORTACATH PLACEMENT: SHX2246

## 2013-01-10 SURGERY — INSERTION, TUNNELED CENTRAL VENOUS DEVICE, WITH PORT
Anesthesia: Monitor Anesthesia Care | Site: Chest | Wound class: Clean

## 2013-01-10 MED ORDER — PROPOFOL INFUSION 10 MG/ML OPTIME
INTRAVENOUS | Status: DC | PRN
  Administered 2013-01-10: 75 ug/kg/min via INTRAVENOUS

## 2013-01-10 MED ORDER — LIDOCAINE HCL (PF) 1 % IJ SOLN
INTRAMUSCULAR | Status: DC | PRN
  Administered 2013-01-10: 30 mL

## 2013-01-10 MED ORDER — LIDOCAINE HCL (PF) 1 % IJ SOLN
INTRAMUSCULAR | Status: AC
  Filled 2013-01-10: qty 30

## 2013-01-10 MED ORDER — ONDANSETRON HCL 4 MG/2ML IJ SOLN
INTRAMUSCULAR | Status: DC | PRN
  Administered 2013-01-10: 4 mg via INTRAVENOUS

## 2013-01-10 MED ORDER — LACTATED RINGERS IV SOLN
INTRAVENOUS | Status: DC | PRN
  Administered 2013-01-10: 11:00:00 via INTRAVENOUS

## 2013-01-10 MED ORDER — FENTANYL CITRATE 0.05 MG/ML IJ SOLN
INTRAMUSCULAR | Status: DC | PRN
  Administered 2013-01-10 (×4): 25 ug via INTRAVENOUS

## 2013-01-10 MED ORDER — SODIUM CHLORIDE 0.9 % IR SOLN
Status: DC | PRN
  Administered 2013-01-10: 11:00:00

## 2013-01-10 MED ORDER — 0.9 % SODIUM CHLORIDE (POUR BTL) OPTIME
TOPICAL | Status: DC | PRN
  Administered 2013-01-10: 1000 mL

## 2013-01-10 MED ORDER — HEPARIN SOD (PORK) LOCK FLUSH 100 UNIT/ML IV SOLN
INTRAVENOUS | Status: AC
  Filled 2013-01-10: qty 5

## 2013-01-10 MED ORDER — HEPARIN SOD (PORK) LOCK FLUSH 100 UNIT/ML IV SOLN
INTRAVENOUS | Status: DC | PRN
  Administered 2013-01-10: 500 [IU]

## 2013-01-10 MED ORDER — MIDAZOLAM HCL 5 MG/5ML IJ SOLN
INTRAMUSCULAR | Status: DC | PRN
  Administered 2013-01-10: 2 mg via INTRAVENOUS

## 2013-01-10 MED ORDER — LIDOCAINE HCL (CARDIAC) 20 MG/ML IV SOLN
INTRAVENOUS | Status: DC | PRN
  Administered 2013-01-10: 60 mg via INTRAVENOUS

## 2013-01-10 MED ORDER — LACTATED RINGERS IV SOLN
INTRAVENOUS | Status: DC
  Administered 2013-01-10: 09:00:00 via INTRAVENOUS

## 2013-01-10 MED ORDER — ONDANSETRON HCL 4 MG/2ML IJ SOLN
INTRAMUSCULAR | Status: AC
  Administered 2013-01-10: 4 mg
  Filled 2013-01-10: qty 2

## 2013-01-10 MED ORDER — TRAMADOL HCL 50 MG PO TABS: 50.0000 mg | ORAL_TABLET | Freq: Four times a day (QID) | ORAL | Status: DC | PRN

## 2013-01-10 MED ORDER — SCOPOLAMINE 1 MG/3DAYS TD PT72
MEDICATED_PATCH | TRANSDERMAL | Status: AC
  Administered 2013-01-10: 1.5 mg
  Filled 2013-01-10: qty 1

## 2013-01-10 SURGICAL SUPPLY — 60 items
APL SKNCLS STERI-STRIP NONHPOA (GAUZE/BANDAGES/DRESSINGS) ×1
BAG DECANTER FOR FLEXI CONT (MISCELLANEOUS) ×2 IMPLANT
BENZOIN TINCTURE PRP APPL 2/3 (GAUZE/BANDAGES/DRESSINGS) ×2 IMPLANT
BLADE SURG 11 STRL SS (BLADE) ×2 IMPLANT
BLADE SURG 15 STRL LF DISP TIS (BLADE) ×1 IMPLANT
BLADE SURG 15 STRL SS (BLADE) ×2
CHLORAPREP W/TINT 26ML (MISCELLANEOUS) ×2 IMPLANT
CLOTH BEACON ORANGE TIMEOUT ST (SAFETY) ×2 IMPLANT
COVER SURGICAL LIGHT HANDLE (MISCELLANEOUS) ×2 IMPLANT
COVER TRANSDUCER ULTRASND (DRAPES) IMPLANT
COVER TRANSDUCER ULTRASND GEL (DRAPE) ×2 IMPLANT
CRADLE DONUT ADULT HEAD (MISCELLANEOUS) ×2 IMPLANT
DECANTER SPIKE VIAL GLASS SM (MISCELLANEOUS) ×1 IMPLANT
DRAPE C-ARM 42X72 X-RAY (DRAPES) ×2 IMPLANT
DRAPE LAPAROSCOPIC ABDOMINAL (DRAPES) ×2 IMPLANT
DRAPE UTILITY 15X26 W/TAPE STR (DRAPE) ×4 IMPLANT
DRSG TEGADERM 2-3/8X2-3/4 SM (GAUZE/BANDAGES/DRESSINGS) ×2 IMPLANT
ELECT CAUTERY BLADE 6.4 (BLADE) ×2 IMPLANT
ELECT REM PT RETURN 9FT ADLT (ELECTROSURGICAL) ×2
ELECTRODE REM PT RTRN 9FT ADLT (ELECTROSURGICAL) ×1 IMPLANT
GAUZE SPONGE 2X2 8PLY STRL LF (GAUZE/BANDAGES/DRESSINGS) ×1 IMPLANT
GAUZE SPONGE 4X4 16PLY XRAY LF (GAUZE/BANDAGES/DRESSINGS) ×2 IMPLANT
GLOVE BIO SURGEON STRL SZ8 (GLOVE) ×2 IMPLANT
GLOVE BIOGEL PI IND STRL 6.5 (GLOVE) IMPLANT
GLOVE BIOGEL PI IND STRL 8 (GLOVE) ×1 IMPLANT
GLOVE BIOGEL PI INDICATOR 6.5 (GLOVE) ×1
GLOVE BIOGEL PI INDICATOR 8 (GLOVE) ×3
GLOVE ECLIPSE 8.0 STRL XLNG CF (GLOVE) ×2 IMPLANT
GOWN PREVENTION PLUS XLARGE (GOWN DISPOSABLE) ×2 IMPLANT
GOWN STRL NON-REIN LRG LVL3 (GOWN DISPOSABLE) ×4 IMPLANT
INTRODUCER 13FR (MISCELLANEOUS) IMPLANT
INTRODUCER COOK 11FR (CATHETERS) IMPLANT
KIT BASIN OR (CUSTOM PROCEDURE TRAY) ×2 IMPLANT
KIT PORT POWER 8FR ISP CVUE (Catheter) ×1 IMPLANT
KIT PORT POWER 9.6FR MRI PREA (Catheter) IMPLANT
KIT PORT POWER ISP 8FR (Catheter) IMPLANT
KIT POWER CATH 8FR (Catheter) IMPLANT
KIT ROOM TURNOVER OR (KITS) ×2 IMPLANT
NDL HYPO 25GX1X1/2 BEV (NEEDLE) ×1 IMPLANT
NEEDLE 22X1 1/2 (OR ONLY) (NEEDLE) ×1 IMPLANT
NEEDLE HYPO 25GX1X1/2 BEV (NEEDLE) ×2 IMPLANT
NS IRRIG 1000ML POUR BTL (IV SOLUTION) ×2 IMPLANT
PACK SURGICAL SETUP 50X90 (CUSTOM PROCEDURE TRAY) ×2 IMPLANT
PAD ARMBOARD 7.5X6 YLW CONV (MISCELLANEOUS) ×3 IMPLANT
PENCIL BUTTON HOLSTER BLD 10FT (ELECTRODE) ×2 IMPLANT
SET INTRODUCER 12FR PACEMAKER (SHEATH) IMPLANT
SET SHEATH INTRODUCER 10FR (MISCELLANEOUS) IMPLANT
SHEATH COOK PEEL AWAY SET 9F (SHEATH) IMPLANT
SPONGE GAUZE 2X2 STER 10/PKG (GAUZE/BANDAGES/DRESSINGS) ×1
STRIP CLOSURE SKIN 1/4X4 (GAUZE/BANDAGES/DRESSINGS) ×2 IMPLANT
SUT MON AB 4-0 PC3 18 (SUTURE) ×2 IMPLANT
SUT VIC AB 2-0 SH 18 (SUTURE) ×2 IMPLANT
SUT VIC AB 3-0 SH 27 (SUTURE) ×2
SUT VIC AB 3-0 SH 27X BRD (SUTURE) ×1 IMPLANT
SYR 20ML ECCENTRIC (SYRINGE) ×4 IMPLANT
SYR 5ML LUER SLIP (SYRINGE) ×2 IMPLANT
SYR CONTROL 10ML LL (SYRINGE) ×2 IMPLANT
TOWEL OR 17X24 6PK STRL BLUE (TOWEL DISPOSABLE) ×1 IMPLANT
TOWEL OR 17X26 10 PK STRL BLUE (TOWEL DISPOSABLE) ×2 IMPLANT
WATER STERILE IRR 1000ML POUR (IV SOLUTION) IMPLANT

## 2013-01-10 NOTE — H&P (View-Only) (Signed)
Patient ID: Carrie Berry, female   DOB: [birth year], 63 y.o.   MRN: [id number]  No chief complaint on file.   HPI Carrie Berry is a 62 y.o. female.   HPI  She is referred by Dr. Tilda Burrow for further evaluation and treatment of a newly diagnosed triple negative invasive ductal carcinoma the right breast. Proliferation rate is 86%. She noticed a right breast mass 8 months ago that has been progressively increasing in size. There is some discomfort and some lateral nipple deviation as well. She went for mammogram and ultrasound. The lesion was noted at the 9:00 position. An abnormal lymph node was noted as well. There is also a lesion noted at the 3:00 position of the left breast. The lesion measured 5.7 cm on ultrasound. Image guided biopsy of the right breast mass as well as the right axillary lymph node were positive for invasive ductal carcinoma. Image guided biopsy of the left breast abnormality demonstrated fibrocystic changes with no malignancy.  MRI was performed. The mass is 4.8 cm on MRI. It goes down to the chest wall and rises up to the nipple area. Pulmonary nodules were noted on the MRI as well.  She has a cousin on her father's side who had breast cancer. Age at first menstrual period was 77. Age at first live birth was 35. No hormone replacement therapy.    She is here with her husband.  Past Medical History  Diagnosis Date  . Recurrent sinus infections   . Arthritis     Past Surgical History  Procedure Laterality Date  . Tubal ligation    . Tubal ligation  1985    Family History  Problem Relation Age of Onset  . Bladder Cancer Father   . Colon cancer Maternal Grandmother     Social History History  Substance Use Topics  . Smoking status: Never Smoker   . Smokeless tobacco: Not on file  . Alcohol Use: Yes     Comment: 1/2 glass 2-3 times per week    Allergies  Allergen Reactions  . Codeine   . Penicillins     Rash in childhood    Current Outpatient  Prescriptions  Medication Sig Dispense Refill  . aspirin 81 MG tablet Take 81 mg by mouth daily.      . cetirizine (ZYRTEC) 10 MG tablet Take 10 mg by mouth daily.      . fish oil-omega-3 fatty acids 1000 MG capsule Take 2 g by mouth daily.      . Magnesium-Calcium-Folic Acid (MAGNEBIND 400 PO) Take 1 tablet by mouth daily.      . Multiple Vitamin (MULTIVITAMIN) tablet Take 1 tablet by mouth daily.       No current facility-administered medications for this visit.    Review of Systems Review of Systems  Constitutional:       Weight loss.  HENT: Negative.   Respiratory: Negative.   Cardiovascular: Negative.   Gastrointestinal: Negative.   Endocrine: Negative.   Genitourinary: Negative.   Musculoskeletal: Negative.   Neurological: Negative.   Hematological: Negative.     There were no vitals taken for this visit.  Physical Exam Physical Exam  Constitutional: No distress.  Thin female.  HENT:  Head: Normocephalic and atraumatic.  Eyes: EOM are normal. No scleral icterus.  Neck: Neck supple.  No supraclavicular adenopathy.  Cardiovascular: Normal rate and regular rhythm.   Pulmonary/Chest: Effort normal and breath sounds normal.  5-6 cm fixed mass was some lateral nipple  deviation in the right breast at the 9:00 position.  Small puncture wound in the left breast. No dominant masses present. No suspicious skin changes in left breast.  Abdominal: Soft. She exhibits no distension and no mass. There is no tenderness.  Musculoskeletal: Normal range of motion. She exhibits no edema.  No axillary adenopathy.  Lymphadenopathy:    She has no cervical adenopathy.  Neurological: She is alert.  Skin: Skin is warm and dry.  Psychiatric: She has a normal mood and affect. Her behavior is normal.    Data Reviewed Mammogram, ultrasound, MRI, pathology results.  Assessment    Advanced invasive cancer of right breast with concern for pulmonary metastasis. We briefly discussed  surgery for this but I told her after her chemotherapy treatments I would see her and make a more definitive recommendation at that time. I did tell her it would probably be mastectomy. As for now she needs long-term venous access for chemotherapy.     Plan    Ultrasound-guided Port-A-Cath insertion.  The procedure risks and aftercare been explained. Risks include but are not limited to bleeding, infection, malfunction, pneumothorax, wound problems, DVT.        Lynsay Fesperman J 01/01/2013, 11:56 AM

## 2013-01-10 NOTE — Anesthesia Preprocedure Evaluation (Addendum)
Anesthesia Evaluation  Patient identified by MRN, date of birth, ID band Patient awake    Reviewed: Allergy & Precautions, H&P , NPO status , Patient's Chart, lab work & pertinent test results  History of Anesthesia Complications (+) PONV  Airway Mallampati: II TM Distance: >3 FB Neck ROM: Full    Dental  (+) Teeth Intact and Dental Advisory Given   Pulmonary neg pulmonary ROS,    Pulmonary exam normal       Cardiovascular negative cardio ROS      Neuro/Psych PSYCHIATRIC DISORDERS Anxiety negative neurological ROS     GI/Hepatic negative GI ROS, Neg liver ROS,   Endo/Other  negative endocrine ROS  Renal/GU negative Renal ROS     Musculoskeletal   Abdominal   Peds  Hematology negative hematology ROS (+)   Anesthesia Other Findings   Reproductive/Obstetrics                          Anesthesia Physical Anesthesia Plan  ASA: II  Anesthesia Plan: MAC   Post-op Pain Management:    Induction: Intravenous  Airway Management Planned: LMA  Additional Equipment:   Intra-op Plan:   Post-operative Plan: Extubation in OR  Informed Consent: I have reviewed the patients History and Physical, chart, labs and discussed the procedure including the risks, benefits and alternatives for the proposed anesthesia with the patient or authorized representative who has indicated his/her understanding and acceptance.   Dental advisory given  Plan Discussed with: CRNA, Anesthesiologist and Surgeon  Anesthesia Plan Comments:        Anesthesia Quick Evaluation

## 2013-01-10 NOTE — Anesthesia Postprocedure Evaluation (Signed)
Anesthesia Post Note  Patient: Carrie Berry  Procedure(s) Performed: Procedure(s) (LRB): ULTRASOUND GUIDED PORT-A-CATH INSERTION WITH FLUOROSCOPY (N/A)  Anesthesia type: MAC  Patient location: PACU  Post pain: Pain level controlled  Post assessment: Patient's Cardiovascular Status Stable  Post vital signs: Reviewed and stable  Level of consciousness: sedated  Complications: No apparent anesthesia complications

## 2013-01-10 NOTE — Transfer of Care (Signed)
Immediate Anesthesia Transfer of Care Note  Patient: Carrie Berry  Procedure(s) Performed: Procedure(s): ULTRASOUND GUIDED PORT-A-CATH INSERTION WITH FLUOROSCOPY (N/A)  Patient Location: PACU  Anesthesia Type:MAC  Level of Consciousness: awake, alert , oriented, unresponsive and responds to stimulation  Airway & Oxygen Therapy: Spontaneous RR  Post-op Assessment: Report given to PACU RN, Post -op Vital signs reviewed and stable and Patient moving all extremities X 4  Post vital signs: Reviewed and stable  Complications: No apparent anesthesia complications

## 2013-01-10 NOTE — Interval H&P Note (Signed)
History and Physical Interval Note:  01/10/2013 11:07 AM  Carrie Berry  has presented today for surgery, with the diagnosis of RIGHT BREAST CANCER  The various methods of treatment have been discussed with the patient and family. After consideration of risks, benefits and other options for treatment, the patient has consented to  Procedure(s): ULTRASOUND GUIDED PORT-A-CATH INSERTION WITH FLUOROSCOPY (N/A) as a surgical intervention .  The patient's history has been reviewed, patient examined, no change in status, stable for surgery.  I have reviewed the patient's chart and labs.  Questions were answered to the patient's satisfaction.     Carrie Berry Shela [facility]

## 2013-01-10 NOTE — Op Note (Signed)
Preoperative diagnosis:   Stage IV right breast cancer  Postoperative diagnosis:  Same  Procedure: Ultrasound-guided Port-A-Cath insertion into right internal jugular vein under fluoroscopy.  Surgeon: Avel Peace M.D.  Anesthesia: Local (Xylocaine) with MAC.  Indication:  This is a 62 year old female with advance breast cancer who needs long-term access for chemotherapy.  She presents for Port-a-cath insertion.  Technique: She was brought to the operating room, placed supine on the operating table, and intravenous sedation was given. A roll was placed under the back and the arms were tucked. The upper chest wall and neck were sterilely prepped and draped.  Her head was rotated to the left. Using the ultrasound, the right internal jugular vein was identified. Local anesthetic was infiltrated in the skin and subcutaneous tissue just anterior to it. A 16-gauge needle was used to cannulate the right internal jugular vein under ultrasound guidance. A wire was then threaded through the needle into the internal jugular vein and down into the right heart under ultrasound and fluoroscopic guidance.  Local anesthetic was infiltrated into the right upper chest wall. A right upper chest wall incision was made and a pocket was created for the Portacath.  An incision was made around the wire in the neck. The catheter was then tunneled from the chest wall incision up through the neck incision.  A dilator- introducer complex was placed over the wire into the superior vena cava. The dilator and wire were then removed and the catheter was threaded through the peel-away sheath introducer into the right heart. The introducer was then peeled away and removed. Under fluoroscopic guidance, the tip of the catheter was then pulled back until it was at the junction of the superior vena cava and right atrium. The catheter was then connected to the port.  The port aspirated blood and flushed easily.  The port was then  anchored to the chest wall with 2-0 Vicryl suture. Concentrated heparin solution was then placed into the port. The port and catheter position were then verified using fluoroscopy. The subcutaneous tissue was then closed over the port with running 3-0 Vicryl suture. The skin incisions were then closed with 4-0 Monocryl subcuticular stitches. Steri-Strips and sterile dressings were applied.  The procedure was well-tolerated without any apparent complications. The patient was taken to the recovery room in satisfactory condition where a portable chest x-ray is pending.

## 2013-01-10 NOTE — Preoperative (Signed)
Beta Blockers   Reason not to administer Beta Blockers:Not Applicable 

## 2013-01-13 DIAGNOSIS — C50919 Malignant neoplasm of unspecified site of unspecified female breast: Secondary | ICD-10-CM

## 2013-01-13 HISTORY — DX: Malignant neoplasm of unspecified site of unspecified female breast: C50.919

## 2013-01-14 ENCOUNTER — Encounter (HOSPITAL_COMMUNITY): Payer: Self-pay | Admitting: General Surgery

## 2013-01-14 ENCOUNTER — Ambulatory Visit (HOSPITAL_BASED_OUTPATIENT_CLINIC_OR_DEPARTMENT_OTHER): Payer: BC Managed Care – PPO | Admitting: Oncology

## 2013-01-14 VITALS — BP 143/82 | HR 85 | Temp 98.3°F | Resp 20 | Ht 63.0 in | Wt 139.2 lb

## 2013-01-14 DIAGNOSIS — C50419 Malignant neoplasm of upper-outer quadrant of unspecified female breast: Secondary | ICD-10-CM

## 2013-01-14 DIAGNOSIS — C773 Secondary and unspecified malignant neoplasm of axilla and upper limb lymph nodes: Secondary | ICD-10-CM

## 2013-01-14 DIAGNOSIS — C50411 Malignant neoplasm of upper-outer quadrant of right female breast: Secondary | ICD-10-CM

## 2013-01-14 DIAGNOSIS — Z171 Estrogen receptor negative status [ER-]: Secondary | ICD-10-CM

## 2013-01-14 MED ORDER — LIDOCAINE-PRILOCAINE 2.5-2.5 % EX CREA: TOPICAL_CREAM | CUTANEOUS | Status: DC | PRN

## 2013-01-14 NOTE — Progress Notes (Signed)
Patient ID: Carrie Berry, female   DOB: 05-Jul-1950, 62 y.o.   MRN: [id number] ID: [name] OB: [birth year]  MR#: [id number]  CSN#:[id number]  PCP: Katy Apo, MD GYN:   SU: Avel Peace OTHER MD: Lurline Hare, Danise Edge, [place] Cornella   HISTORY OF PRESENT ILLNESS: Carrie Berry noted a mass in her right breast December of 2013, but did not think much of it. It did grow some lower the summer, but she was keeping her grandchild at that time and was too busy so she did not bring it to Dr. Lynnette Caffey attention until August. He set her up for bilateral mammography and ultrasonography at [facility] 12/18/2012 and this measured, on the right, a large irregular lobulated mass in the upper outer quadrant which by ultrasound measured 5.7 cm. The right axilla showed some lymph nodes with thickened cortices. In the left breast there was a focal asymmetry at the depth, lateral to the nipple, and by ultrasound there was a 9 mm ill-defined hypoechoic lesion in this location. Biopsy of the left breast lesion showed only fibrocystic changes.  Biopsy of the right breast mass and right axillary adenopathy ([id number]) on 12/23/2012 showed both to be involved by invasive ductal carcinoma, grade 2, triple negative, with an MIB-1 of 86%.  MRI of the breast 12/27/2012 at [facility] imaging showed in the right breast a mass abutting the pectoralis muscle without enhancement of the muscle measuring 4.8 cm. The satellite nodule measuring approximately 3 mm was also noted superior to the mass and several abnormal and enlarged right axillary lymph nodes were noted, one of which appeared to be necrotic. The largest node measured 2.0 cm. Unfortunately, numerous bilateral pulmonary nodules were also noted.  The patient's subsequent history is as detailed below   INTERVAL HISTORY: Carrie Berry returns today accompanied by her husband Sterling Big discuss results of her staging scans. Since her last visit here she had her Port-A-Cath placed.  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  REVIEW OF SYSTEMS: She tolerated the procedure without significant problems, and particularly without significant pain, swelling, or other complications. She has no headaches, visual changes, nausea, vomiting, gait imbalance or dizziness. A detailed review of systems was otherwise entirely negative. She continues to work full-time.  PAST MEDICAL HISTORY: Past Medical History  Diagnosis Date  . Recurrent sinus infections   . Arthritis   . PONV (postoperative nausea and vomiting)   . Anxiety   . Cancer     breast    PAST SURGICAL HISTORY: Past Surgical History  Procedure Laterality Date  . Tubal ligation    . Tubal ligation  1985  . Portacath placement N/A 01/10/2013    Procedure: ULTRASOUND GUIDED PORT-A-CATH INSERTION WITH FLUOROSCOPY;  Surgeon: Adolph Pollack, MD;  Location: [facility] OR;  Service: General;  Laterality: N/A;    FAMILY HISTORY Family History  Problem Relation Age of Onset  . Bladder Cancer Father   . Colon cancer Maternal Grandmother    the patient's parents are living, both in their 88s. The patient's father was diagnosed with bladder cancer the age of 3. The patient's mother's mother had some type of gastrointestinal cancer. The patient had one brother, no sisters. There is no history of breast or ovarian cancer in the family other than a cousin on the father's side who was diagnosed with breast cancer apparently before the age of 58.  GYNECOLOGIC HISTORY:  Menarche age 83, first live birth age 64, the patient is GX P2. She went through menopause in  2008. She did not take hormone replacement. She is birth control for approximately 22 years remotely.  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 Her husband Tahni Porchia [name]"), is vice Conservation officer, nature for [employer]. the patient's daughter and Carrie Berry is a stay-at-home mom in [place],  the patient's son Carrie Berry unfortunately died in an automobile accident in December of 2011. The patient has 2 grandchildren. She attends to have an echo in [employer] [place] [employer].    ADVANCED DIRECTIVES: Not in place   HEALTH MAINTENANCE: History  Substance Use Topics  . Smoking status: Never Smoker   . Smokeless tobacco: Not on file  . Alcohol Use: No     Colonoscopy: 2005  PAP:  Bone density:  Lipid panel:  Allergies  Allergen Reactions  . Codeine Nausea And Vomiting    "violently ill"  . Penicillins Rash    Childhood reaction -    Current Outpatient Prescriptions  Medication Sig Dispense Refill  . acetaminophen (TYLENOL) 325 MG tablet Take 650 mg by mouth every 8 (eight) hours as needed for pain.      . cetirizine (ZYRTEC) 10 MG tablet Take 10 mg by mouth daily.      . Magnesium 400 MG CAPS Take 400 mg by mouth daily.      . Multiple Vitamin (MULTIVITAMIN WITH MINERALS) TABS tablet Take 1 tablet by mouth daily.      Marland Kitchen PATADAY 0.2 % SOLN Place 1 drop into both eyes daily as needed (dry eyes).        No current facility-administered medications for this visit.    OBJECTIVE: Middle-aged white woman in no acute distress Filed Vitals:   01/14/13 1622  BP: 143/82  Pulse: 85  Temp: 98.3 F (36.8 C)  Resp: 20     Body mass index is 24.66 kg/(m^2).    ECOG FS: 0  Sclerae unicteric, pupils equal round and reactive to light Oropharynx clear No cervical or supraclavicular adenopathy Lungs no rales or rhonchi Heart regular rate and rhythm Abd soft, nontender, positive bowel sounds MSK no focal spinal tenderness, no peripheral edema Neuro: non-focal, well-oriented, pleasant affect Breasts: There is a large palpable, movable mass in the right breast. I do not palpate obvious right axillary adenopathy. I do not palpate any mass in the left breast.   LAB RESULTS:  CMP     Component Value Date/Time   NA 138 01/08/2013 1504   NA 144 01/01/2013 0750   K 4.0  01/08/2013 1504   K 4.3 01/01/2013 0750   CL 100 01/08/2013 1504   CO2 28 01/08/2013 1504   CO2 27 01/01/2013 0750   GLUCOSE 90 01/08/2013 1504   GLUCOSE 100 01/01/2013 0750   BUN 18 01/08/2013 1504   BUN 16.6 01/01/2013 0750   CREATININE 0.82 01/08/2013 1504   CREATININE 0.8 01/01/2013 0750   CALCIUM 9.9 01/08/2013 1504   CALCIUM 9.9 01/01/2013 0750   PROT 7.9 01/01/2013 0750   ALBUMIN 3.8 01/01/2013 0750   AST 18 01/01/2013 0750   ALT 17 01/01/2013 0750   ALKPHOS 101 01/01/2013 0750   BILITOT 0.59 01/01/2013 0750   GFRNONAA 77* 01/08/2013 1504   GFRAA 89* 01/08/2013 1504    I No results found for this basename: SPEP,  UPEP,   kappa and lambda light chains    Lab Results  Component Value Date   WBC 11.3* 01/08/2013   NEUTROABS 6.1 01/01/2013  HGB 13.1 01/08/2013   HCT 38.1 01/08/2013   MCV 89.9 01/08/2013   PLT 296 01/08/2013      Chemistry      Component Value Date/Time   NA 138 01/08/2013 1504   NA 144 01/01/2013 0750   K 4.0 01/08/2013 1504   K 4.3 01/01/2013 0750   CL 100 01/08/2013 1504   CO2 28 01/08/2013 1504   CO2 27 01/01/2013 0750   BUN 18 01/08/2013 1504   BUN 16.6 01/01/2013 0750   CREATININE 0.82 01/08/2013 1504   CREATININE 0.8 01/01/2013 0750      Component Value Date/Time   CALCIUM 9.9 01/08/2013 1504   CALCIUM 9.9 01/01/2013 0750   ALKPHOS 101 01/01/2013 0750   AST 18 01/01/2013 0750   ALT 17 01/01/2013 0750   BILITOT 0.59 01/01/2013 0750       No results found for this basename: LABCA2    No components found with this basename: LABCA125    No results found for this basename: INR,  in the last 168 hours  Urinalysis No results found for this basename: colorurine,  appearanceur,  labspec,  phurine,  glucoseu,  hgbur,  bilirubinur,  ketonesur,  proteinur,  urobilinogen,  nitrite,  leukocytesur    STUDIES: Mr Breast Bilateral W Wo Contrast  12/30/2012   *RADIOLOGY REPORT*  Clinical Data:  Patient with recent diagnosis of right invasive ductal carcinoma with positive  lymph node.  MRI LEFT BREAST WITHOUT AND WITH CONTRAST  Technique: Multiplanar, multisequence MR images of the bilateral breasts were obtained prior to and following the intravenous administration of 12ml of Multihance.  Comparison:  Recent imaging examinations.  FINDINGS:  Breast composition:  c:  Heterogeneous fibroglandular tissue  Background parenchymal enhancement:  Moderate  Left breast:  Non mass enhancement is identified in the upper outer quadrant of the left breast posteriorly, measuring 1.5 x 1.4 x 1 cm.  This corresponds with the area of previous biopsy and likely represents post procedure changes.  Enhancing focus measuring 3 x 3 x 4 mm is identified in the upper central left breast.  Right breast: Centrally necrotic mass is identified in the outer right breast spanning the upper and lower portions.  The mass abuts the pectoralis major muscle without muscle enhancement to indicate invasion. The majority of the mass measures 4.6 x 4.8 x 4.7 cm. Additionally, there is a band of enhancement extending from the mass  to the nipple with associated nipple retraction. A 3 x 2 x 2 mm focus of enhancement is identified approximately 3 mm superior to the mass, likely representing a satellite nodule.  Lymph nodes:  Abnormal and enlarged right axillary lymph nodes are identified.  One lymph node appears necrotic. The largest node measures 2 x 1.7 x 1.1 cm.  Ancillary findings:  Numerous bilateral pulmonary nodules.  IMPRESSION: 1. A 4.6 x 4.8 x 4.7 cm necrotic mass in the outer right breast, spanning the upper and lower quadrants. Abnormal enhancement from the mass extends anteriorly to the nipple with associated nipple retraction.  No evidence of chest wall invasion. Small associated satellite nodule.  2. Right axillary adenopathy, with one lymph node appearing necrotic.  3. Enhancing left breast focus, measuring up to 4mm.  4. Numerous bilateral pulmonary nodules.  5. Non mass enhancement in the upper outer left  breast, likely correlating with postprocedure changes.  RECOMMENDATION: Staging CT, given MRI apparent pulmonary nodules. The enhancing focus in the left breast can be followed after treatment. If clinically appropriate,  MRI guided biopsy of this focus can be performed.  BI-RADS CATEGORY 6:  Known biopsy-proven malignancy - appropriate action should be taken.  THREE-DIMENSIONAL MR IMAGE RENDERING ON INDEPENDENT WORKSTATION:  Three-dimensional MR images were rendered by post-processing of the original MR data on a DynaCad workstation.  The three-dimensional MR images were interpreted, and findings were reported in the accompanying complete MRI report for this study.   Original Report Authenticated By: Jerene Dilling, M.D.   Ct Chest W Contrast  01/07/2013   *RADIOLOGY REPORT*  Clinical Data: New diagnosis of stage IV breast cancer.  Staging study.  CT CHEST WITH CONTRAST  Technique:  Multidetector CT imaging of the chest was performed following the standard protocol during bolus administration of intravenous contrast.  Contrast: 80mL OMNIPAQUE IOHEXOL 300 MG/ML  SOLN  Comparison: No priors.  Findings:  Mediastinum: Heart size is normal. There is no significant pericardial fluid, thickening or pericardial calcification. No pathologically enlarged mediastinal, hilar or internal mammary lymph nodes. Esophagus is unremarkable in appearance.  Lungs/Pleura: Numerous bilateral pulmonary nodules compatible with multiple pulmonary metastases.  The largest of these is located in the right upper lobe (image 20 of series 5) measuring 9 mm in diameter.  Several of the largest of these nodules were hypermetabolic on the contemporaneous PET-CT.  No consolidative airspace disease.  No pleural effusions.  Upper Abdomen: Multiple ill-defined low attenuation liver lesions are noted, compatible with metastatic lesions.  The largest of these is in the right lobe of the liver measuring approximately 1.2 x 3.0 cm (image 54 of series  2).  Musculoskeletal: Macrolobulated right breast mass measuring 3.7 x 4.3 cm.  This makes contact with the underlying pectoralis major musculature, obscuring the intervening fat plane on image 31 of series 2.  Numerous borderline enlarged and mildly enlarged right axillary lymph nodes, largest of which measures up to 1.8 cm in diameter. There are no aggressive appearing lytic or blastic lesions noted in the visualized portions of the skeleton.  IMPRESSION: 1.  Findings, as above, compatible with stage IV breast cancer. Today's study demonstrates a primary right breast mass, right axillary lymphadenopathy, numerous pulmonary metastases and numerous liver lesions which were also highly suspicious for metastatic lesions.   Original Report Authenticated By: Trudie Reed, M.D.   Mr Breast Bilateral W Wo Contrast  12/30/2012   *RADIOLOGY REPORT*  Clinical Data:  Patient with recent diagnosis of right invasive ductal carcinoma with positive lymph node.  MRI LEFT BREAST WITHOUT AND WITH CONTRAST  Technique: Multiplanar, multisequence MR images of the bilateral breasts were obtained prior to and following the intravenous administration of 12ml of Multihance.  Comparison:  Recent imaging examinations.  FINDINGS:  Breast composition:  c:  Heterogeneous fibroglandular tissue  Background parenchymal enhancement:  Moderate  Left breast:  Non mass enhancement is identified in the upper outer quadrant of the left breast posteriorly, measuring 1.5 x 1.4 x 1 cm.  This corresponds with the area of previous biopsy and likely represents post procedure changes.  Enhancing focus measuring 3 x 3 x 4 mm is identified in the upper central left breast.  Right breast: Centrally necrotic mass is identified in the outer right breast spanning the upper and lower portions.  The mass abuts the pectoralis major muscle without muscle enhancement to indicate invasion. The majority of the mass measures 4.6 x 4.8 x 4.7 cm. Additionally, there is  a band of enhancement extending from the mass  to the nipple with associated nipple retraction. A 3 x  2 x 2 mm focus of enhancement is identified approximately 3 mm superior to the mass, likely representing a satellite nodule.  Lymph nodes:  Abnormal and enlarged right axillary lymph nodes are identified.  One lymph node appears necrotic. The largest node measures 2 x 1.7 x 1.1 cm.  Ancillary findings:  Numerous bilateral pulmonary nodules.  IMPRESSION: 1. A 4.6 x 4.8 x 4.7 cm necrotic mass in the outer right breast, spanning the upper and lower quadrants. Abnormal enhancement from the mass extends anteriorly to the nipple with associated nipple retraction.  No evidence of chest wall invasion. Small associated satellite nodule.  2. Right axillary adenopathy, with one lymph node appearing necrotic.  3. Enhancing left breast focus, measuring up to 4mm.  4. Numerous bilateral pulmonary nodules.  5. Non mass enhancement in the upper outer left breast, likely correlating with postprocedure changes.  RECOMMENDATION: Staging CT, given MRI apparent pulmonary nodules. The enhancing focus in the left breast can be followed after treatment. If clinically appropriate, MRI guided biopsy of this focus can be performed.  BI-RADS CATEGORY 6:  Known biopsy-proven malignancy - appropriate action should be taken.  THREE-DIMENSIONAL MR IMAGE RENDERING ON INDEPENDENT WORKSTATION:  Three-dimensional MR images were rendered by post-processing of the original MR data on a DynaCad workstation.  The three-dimensional MR images were interpreted, and findings were reported in the accompanying complete MRI report for this study.   Original Report Authenticated By: Jerene Dilling, M.D.   Nm Pet Image Initial (pi) Skull Base To Thigh  01/07/2013   *RADIOLOGY REPORT*  Clinical Data: Initial treatment strategy for breast cancer.  NUCLEAR MEDICINE PET SKULL BASE TO THIGH  Fasting Blood Glucose:  91  Technique:  17.2 mCi F-18 FDG was injected  intravenously. CT data was obtained and used for attenuation correction and anatomic localization only.  (This was not acquired as a diagnostic CT examination.) Additional exam technical data entered on technologist worksheet.  Comparison:  No priors.  Findings:  Neck: No hypermetabolic lymph nodes in the neck.  Hypermetabolism in the region of the pharyngeal tonsils bilaterally (SUVmax = 9.7 - 10.5), presumably physiologic.  Chest:  Large right-sided breast mass measuring approximately 3.7 x 4.3 cm and is diffusely hypermetabolic (SUVmax = 18.9).  Multiple enlarged and hypermetabolic right axillary lymph nodes, largest of which measures 1.8 cm in diameter (SUVmax = 11.4).  Multiple pulmonary nodules scattered throughout the lungs bilaterally, the largest of which demonstrates hypermetabolism.  The largest single nodule at this time measures 9 mm in the right upper lobe (image 73 of series 2).  No pathologically enlarged or hypermetabolic mediastinal, hilar or internal mammary lymph nodes.  There is a small focus of hypermetabolism in the left breast (SUVmax = 2.6)without definite soft tissue nodule or mass.  Abdomen/Pelvis:  There are several ill-defined low attenuation lesions within the liver which demonstrate hypermetabolism.  Two lesions in the right lobe of the liver are most hypermetabolic (SUVmax = 7.8 - 8.8), but are relatively indistinct on the CT portion of the examination.  The most distinct the lesion on the CT portion of the examination is located in segment 4B measuring only 12 mm in diameter (image 128 of series 2), but is hypermetabolic (SUVmax = 6.2).  The unenhanced appearance of the gallbladder, pancreas, spleen, bilateral adrenal glands and bilateral kidneys is unremarkable.  No significant volume of ascites.  No pneumoperitoneum.  No pathologic distension of small bowel.  No definite pathologic lymphadenopathy or hypermetabolic soft tissue mass is identified  within the abdomen or pelvis.   Uterus, ovaries and urinary bladder are unremarkable in appearance.  Skeleton:  Subtle area of sclerosis in the anterior aspect of the right ilium is hypermetabolic (SUVmax = 7.4).  There is an focal area of hypermetabolism in the left gluteus maximus muscle (SUVmax = 3.7), without correlate on the CT portion of the examination. Hypermetabolism in the left ischium without definite bony correlate (SUVmax = 6.9)could be degenerative, or could represent a early metastatic lesion.  IMPRESSION: 1.  Findings are compatible with stage IV breast cancer, including a primary right breast mass, metastatic right axillary adenopathy, numerous metastatic pulmonary nodules, numerous presumed hepatic metastases, and suspicious musculoskeletal lesions, as detailed above. 2.  Profound hypermetabolism in the region of the pharyngeal tonsils bilaterally is favored to be physiologic, but clinical correlation may be warranted to exclude metastatic lesions.   Original Report Authenticated By: Trudie Reed, M.D.   Dg Chest Port 1 View  01/10/2013   *RADIOLOGY REPORT*  Clinical Data: Port-A-Cath insertion.  PORTABLE CHEST - 1 VIEW  Comparison: CT chest 01/07/2013  Findings: Right Port-A-Cath is identified with the tip in the lower SVC.  Bilateral pulmonary nodules are better depicted on recent CT chest examination.  Mild bibasilar opacities are seen, likely representing atelectasis.  No pleural effusion or pneumothorax.  No acute osseous abnormality.  IMPRESSION: 1. Status post right Port-A-Cath insertion with the tip in the lower SVC.  2.  Mild bibasilar atelectasis.  3.  Bilateral pulmonary nodules, better seen on recent CT examination.   Original Report Authenticated By: Jerene Dilling, M.D.   Dg Fluoro Guide Cv Line-no Report  01/10/2013   CLINICAL DATA: Right Breast Cancer - Port-A-Cath   FLOURO GUIDE CV LINE  Fluoroscopy was utilized by the requesting physician.  No radiographic  interpretation.    ASSESSMENT: 62 y.o.  Jacquenette Shone, [place] woman status post right breast upper outer quadrant and right axillary lymph node biopsy 12/23/2012 for a clinical T3 N1 M1, stage IV invasive ductal carcinoma, grade 3, triple negative, with an MIB-1 of 86%.  PLAN: mic has been doing some reading on antioxidants and clinical trials. He has a good understanding of what he is reading, but I have cautioned him against trying to recreate the standard of care. T. Feels ready to start treatment here, and that would be Abraxane, which she would receive beginning on September 24.   However she is also interested in considering clinical trial as, and we actually do not have a trial for first line treatment of metastatic disease here that she could qualify for. Accordingly I will ask the [facility] group to consider her for a second opinion and possible study. In the meantime we will obtain a biopsy most likely of one of her liver lesions, even though they are somewhat indistinct.  I have made her a return appointment with me for 924 with the anticipation that we will either start treatment that day or followup and discussion regarding clinical trials elsewhere. Shanique has a good understanding of this plan and is very much in agreement with it.   Lowella Dell, MD   01/14/2013 4:38 PM

## 2013-01-16 ENCOUNTER — Telehealth: Payer: Self-pay | Admitting: Genetic Counselor

## 2013-01-16 NOTE — Telephone Encounter (Signed)
LVOM FOR PT TO RETURN CALL IN RE TO REFERRAL.  °

## 2013-01-19 ENCOUNTER — Telehealth: Payer: Self-pay | Admitting: Oncology

## 2013-01-19 NOTE — Telephone Encounter (Signed)
S/w pt re appt for 9/24. Pt to get schedule when she comes in.

## 2013-01-20 ENCOUNTER — Other Ambulatory Visit: Payer: Self-pay | Admitting: Radiology

## 2013-01-20 ENCOUNTER — Telehealth: Payer: Self-pay | Admitting: Oncology

## 2013-01-20 NOTE — Telephone Encounter (Signed)
RECORDS FAXED TO [facility] AT [phone]

## 2013-01-21 ENCOUNTER — Encounter (HOSPITAL_COMMUNITY): Payer: Self-pay

## 2013-01-21 ENCOUNTER — Ambulatory Visit (HOSPITAL_COMMUNITY)
Admission: RE | Admit: 2013-01-21 | Discharge: 2013-01-21 | Disposition: A | Discharge status: Home / Self Care | Payer: BC Managed Care – PPO | Source: Ambulatory Visit | Attending: Oncology | Admitting: Oncology

## 2013-01-21 ENCOUNTER — Ambulatory Visit: Payer: BC Managed Care – PPO | Admitting: Oncology

## 2013-01-21 DIAGNOSIS — C50411 Malignant neoplasm of upper-outer quadrant of right female breast: Secondary | ICD-10-CM

## 2013-01-21 DIAGNOSIS — C50919 Malignant neoplasm of unspecified site of unspecified female breast: Secondary | ICD-10-CM | POA: Insufficient documentation

## 2013-01-21 DIAGNOSIS — C787 Secondary malignant neoplasm of liver and intrahepatic bile duct: Secondary | ICD-10-CM | POA: Insufficient documentation

## 2013-01-21 DIAGNOSIS — R599 Enlarged lymph nodes, unspecified: Secondary | ICD-10-CM | POA: Insufficient documentation

## 2013-01-21 DIAGNOSIS — C7800 Secondary malignant neoplasm of unspecified lung: Secondary | ICD-10-CM | POA: Insufficient documentation

## 2013-01-21 LAB — CBC
Hemoglobin: 13.1 g/dL (ref 12.0–15.0)
MCH: 30 pg (ref 26.0–34.0)
MCHC: 33 g/dL (ref 30.0–36.0)
Platelets: 293 10*3/uL (ref 150–400)

## 2013-01-21 LAB — PROTIME-INR: Prothrombin Time: 12 seconds (ref 11.6–15.2)

## 2013-01-21 MED ORDER — ONDANSETRON HCL 4 MG/2ML IJ SOLN
INTRAMUSCULAR | Status: AC
  Filled 2013-01-21: qty 2

## 2013-01-21 MED ORDER — SODIUM CHLORIDE 0.9 % IV SOLN
INTRAVENOUS | Status: DC
  Administered 2013-01-21: 20 mL/h via INTRAVENOUS

## 2013-01-21 MED ORDER — FENTANYL CITRATE 0.05 MG/ML IJ SOLN
INTRAMUSCULAR | Status: AC
  Filled 2013-01-21: qty 6

## 2013-01-21 MED ORDER — ONDANSETRON HCL 4 MG/2ML IJ SOLN
4.0000 mg | Freq: Once | INTRAMUSCULAR | Status: AC
  Administered 2013-01-21: 4 mg via INTRAVENOUS

## 2013-01-21 MED ORDER — FENTANYL CITRATE 0.05 MG/ML IJ SOLN
INTRAMUSCULAR | Status: AC | PRN
  Administered 2013-01-21: 100 ug via INTRAVENOUS

## 2013-01-21 MED ORDER — MIDAZOLAM HCL 2 MG/2ML IJ SOLN
INTRAMUSCULAR | Status: AC
  Filled 2013-01-21: qty 6

## 2013-01-21 MED ORDER — MIDAZOLAM HCL 2 MG/2ML IJ SOLN
INTRAMUSCULAR | Status: AC | PRN
  Administered 2013-01-21: 1 mg via INTRAVENOUS

## 2013-01-21 NOTE — H&P (Signed)
Chief Complaint: "I am here a liver biopsy." Referring Physician: Dr. Darnelle Catalan HPI: Carrie Berry is an 61 y.o. female with history of stage IV breast cancer. CT 8/26 revealed primary right breast mass, right  axillary lymphadenopathy, numerous pulmonary metastases and numerous liver lesions which were also highly suspicious for  metastatic lesions. PET on 8/26 reveal hypermetabolic activity within liver lesion. Patient was seen by Dr. Darnelle Catalan 01/14/13 after a port-a-catheter was placed by surgery on 8/29. The request for a image guided liver lesion biopsy was discussed with the patient and order was placed. The patient states she became very nauseous and vomited 3 times after her port placement on 8/29. She states she has had a colonoscopy in the past with no difficulties or post-procedure illness. She did receive versed, fentanyl and propofol on 8/29. Nausea was alleviated with IV Zofran and transdermal scopolamine. She denies any recent illness, nausea or vomiting. She denies any fever or chills. She denies any active bleeding, blood in stool or urine. She denies any chest pain or shortness of breath.    Past Medical History:  Past Medical History  Diagnosis Date  . Recurrent sinus infections   . Arthritis   . PONV (postoperative nausea and vomiting)   . Anxiety   . Cancer     breast    Past Surgical History:  Past Surgical History  Procedure Laterality Date  . Tubal ligation    . Tubal ligation  1985  . Portacath placement N/A 01/10/2013    Procedure: ULTRASOUND GUIDED PORT-A-CATH INSERTION WITH FLUOROSCOPY;  Surgeon: Adolph Pollack, MD;  Location: [facility] OR;  Service: General;  Laterality: N/A;    Family History:  Family History  Problem Relation Age of Onset  . Bladder Cancer Father   . Colon cancer Maternal Grandmother     Social History:  reports that she has never smoked. She does not have any smokeless tobacco history on file. She reports that she does not drink alcohol or  use illicit drugs.  Allergies:  Allergies  Allergen Reactions  . Codeine Nausea And Vomiting    "violently ill"  . Penicillins Rash    Childhood reaction -      Medication List    ASK your doctor about these medications       acetaminophen 325 MG tablet  Commonly known as:  TYLENOL  Take 650 mg by mouth every 8 (eight) hours as needed for pain.     cetirizine 10 MG tablet  Commonly known as:  ZYRTEC  Take 10 mg by mouth daily.     lidocaine-prilocaine cream  Commonly known as:  EMLA  Apply topically as needed.     Magnesium 400 MG Caps  Take 400 mg by mouth daily.     multivitamin with minerals Tabs tablet  Take 1 tablet by mouth daily.     PATADAY 0.2 % Soln  Generic drug:  Olopatadine HCl  Place 1 drop into both eyes daily as needed (dry eyes).        Please HPI for pertinent positives, otherwise complete 10 system ROS negative.  Physical Exam: Temp: 98.66F, BP: 133/48mmHg, HR: 84bpm, Resp: 18rpm There were no vitals taken for this visit. There is no weight on file to calculate BMI.   General Appearance:  Alert, cooperative, no distress, appears stated age  Head:  Normocephalic, without obvious abnormality, atraumatic  Lungs:   Clear to auscultation bilaterally, no w/r/r, respirations unlabored without use of accessory muscles.  Chest Wall:  No tenderness. Right port-a-catheter in place and palpable right anterior chest wall.  Heart:  Regular rate and rhythm, S1, S2 normal, no murmur, rub or gallop.  Abdomen:   Soft, non-tender, non distended.  Extremities: Extremities normal, atraumatic, no cyanosis or edema  Pulses: 2+ and symmetric  Neurologic: Normal affect, no gross deficits.   Results for orders placed during the hospital encounter of 01/21/13 (from the past 48 hour(s))  APTT     Status: None   Collection Time    01/21/13 11:00 AM      Result Value Range   aPTT 28  24 - 37 seconds  CBC     Status: None   Collection Time    01/21/13 11:00 AM       Result Value Range   WBC 8.6  4.0 - 10.5 K/uL   RBC 4.36  3.87 - 5.11 MIL/uL   Hemoglobin 13.1  12.0 - 15.0 g/dL   HCT 16.1  09.6 - 04.5 %   MCV 91.1  78.0 - 100.0 fL   MCH 30.0  26.0 - 34.0 pg   MCHC 33.0  30.0 - 36.0 g/dL   RDW 40.9  81.1 - 91.4 %   Platelets 293  150 - 400 K/uL  PROTIME-INR     Status: None   Collection Time    01/21/13 11:00 AM      Result Value Range   Prothrombin Time 12.0  11.6 - 15.2 seconds   INR 0.90  0.00 - 1.49   No results found.  Assessment/Plan Stage IV breast cancer with liver lesions seen on CT and hypermetabolic on PET 8/26. Request for image guided hypermetabolic liver lesion biopsy today for staging. Labs reviewed, patient has been NPO. Risks and Benefits discussed with the patient. All of the patient's questions were answered, patient is agreeable to proceed. Consent signed and in chart. Will give patient IV Zofran prior to sedation given most recent experience of nausea/vomiting post sedation on 8/29.   [name] D PA-C 01/21/2013, 12:03 PM

## 2013-01-21 NOTE — Procedures (Signed)
Successful rt hepatic mass 18 g core bxs No comp Stable Path pending Full report in pacs

## 2013-01-22 ENCOUNTER — Other Ambulatory Visit: Payer: Self-pay | Admitting: Oncology

## 2013-01-24 ENCOUNTER — Ambulatory Visit (INDEPENDENT_AMBULATORY_CARE_PROVIDER_SITE_OTHER): Payer: BC Managed Care – PPO | Admitting: General Surgery

## 2013-01-24 ENCOUNTER — Other Ambulatory Visit (INDEPENDENT_AMBULATORY_CARE_PROVIDER_SITE_OTHER): Payer: Self-pay | Admitting: General Surgery

## 2013-01-24 ENCOUNTER — Encounter (INDEPENDENT_AMBULATORY_CARE_PROVIDER_SITE_OTHER): Payer: Self-pay | Admitting: General Surgery

## 2013-01-24 ENCOUNTER — Ambulatory Visit (HOSPITAL_COMMUNITY)
Admission: RE | Admit: 2013-01-24 | Discharge: 2013-01-24 | Disposition: A | Discharge status: Home / Self Care | Payer: BC Managed Care – PPO | Source: Ambulatory Visit | Attending: General Surgery | Admitting: General Surgery

## 2013-01-24 VITALS — BP 130/90 | HR 84 | Temp 98.4°F | Resp 15 | Ht 63.5 in | Wt 136.0 lb

## 2013-01-24 DIAGNOSIS — R220 Localized swelling, mass and lump, head: Secondary | ICD-10-CM | POA: Insufficient documentation

## 2013-01-24 DIAGNOSIS — Z9889 Other specified postprocedural states: Secondary | ICD-10-CM

## 2013-01-24 DIAGNOSIS — C50911 Malignant neoplasm of unspecified site of right female breast: Secondary | ICD-10-CM

## 2013-01-24 DIAGNOSIS — M7989 Other specified soft tissue disorders: Secondary | ICD-10-CM

## 2013-01-24 NOTE — Progress Notes (Signed)
Patient ID: Carrie Berry, female   DOB: 22-Oct-1950, 62 y.o.   MRN: [id number] [place] negative for Right IJ DVT.  I informed her of this and instructed her to apply moist heat to the area and take an NSAID.

## 2013-01-24 NOTE — Patient Instructions (Signed)
We will schedule you for an ultrasound to make sure you do not have a blood clot in your neck vein.

## 2013-01-24 NOTE — Progress Notes (Signed)
*  Preliminary Results* Right upper extremity venous duplex completed. Right upper extremity is negative for deep and superficial vein thrombosis.   Preliminary results discussed with Lorene Dy, RN.  01/24/2013 5:15 PM  Gertie Fey, RVT, RDCS, RDMS

## 2013-01-24 NOTE — Progress Notes (Signed)
Procedure:  Port-A-Cath insertion into R IJ vein for neoadjuvant therapy for breast cancer  Date:  01/10/13  Pathology:  Stage IV breast cancer  History:  She is here reporting that she noted some tightness in the right neck that occurred about 3 days ago. No fever or chills.  Exam: General- Is in NAD. Neck-there is a firm cord superior to the right neck incision.The catheter is palpable under the skin. Chest-Port-A-Cath site wound is clean  Assessment:  New-onset swelling right neck. Need to rule out right internal jugular vein deep venous thrombosis.  Plan:  We will send her for a ultrasound study of the right internal jugular vein to rule out deep venous thrombosis.

## 2013-01-28 ENCOUNTER — Telehealth: Payer: Self-pay | Admitting: *Deleted

## 2013-01-28 NOTE — Telephone Encounter (Signed)
Message left by pt stating she has decided to proceed with a clinical trial at [facility] under Dr Caswell Corwin ( referred by Dr Thea Silversmith ). She wanted to let Dr Thea Silversmith know and to cancel scheduled appointments at this time " but I will call and let Dr Darnelle Catalan know how I am doing ".  This RN returned call to pt to discuss above for coordination of care if needed. Obtained identified VM- message left stating appointments will be cancelled with request to return call to RN to discuss coordinating any local labs or care.

## 2013-01-29 ENCOUNTER — Other Ambulatory Visit: Payer: Self-pay | Admitting: Oncology

## 2013-01-31 ENCOUNTER — Telehealth: Payer: Self-pay | Admitting: *Deleted

## 2013-01-31 NOTE — Telephone Encounter (Signed)
Plan to begin treatment at [facility] 9/23. Need ER/PR results from her breast/node biopsy from 12/23/12 and liver biopsy 01/21/13 if done. Faxed report after obtained from [facility] Pathology. No ER/PR done on liver sample.

## 2013-02-05 ENCOUNTER — Encounter: Payer: BC Managed Care – PPO | Admitting: Oncology

## 2013-02-05 ENCOUNTER — Other Ambulatory Visit: Payer: BC Managed Care – PPO | Admitting: Lab

## 2013-02-05 ENCOUNTER — Ambulatory Visit: Payer: BC Managed Care – PPO

## 2013-02-06 ENCOUNTER — Other Ambulatory Visit: Payer: Self-pay | Admitting: *Deleted

## 2013-02-06 DIAGNOSIS — C50411 Malignant neoplasm of upper-outer quadrant of right female breast: Secondary | ICD-10-CM

## 2013-02-07 ENCOUNTER — Telehealth: Payer: Self-pay | Admitting: Oncology

## 2013-02-07 NOTE — Telephone Encounter (Signed)
S/w pt re appt for lb only 10/7. Per 9/24 pof from [facility] cx all appts. Per 9/25 pof from Val schedule lb 10/7.  S/w Val re 9/24 pof sent by [facility] and per Val 10/7 lbs are for [facility].

## 2013-02-08 ENCOUNTER — Emergency Department (HOSPITAL_COMMUNITY)
Admission: EM | Admit: 2013-02-08 | Discharge: 2013-02-08 | Disposition: A | Discharge status: Home / Self Care | Payer: BC Managed Care – PPO | Attending: Emergency Medicine | Admitting: Emergency Medicine

## 2013-02-08 ENCOUNTER — Encounter (HOSPITAL_COMMUNITY): Payer: Self-pay | Admitting: *Deleted

## 2013-02-08 DIAGNOSIS — R110 Nausea: Secondary | ICD-10-CM | POA: Insufficient documentation

## 2013-02-08 DIAGNOSIS — Z79899 Other long term (current) drug therapy: Secondary | ICD-10-CM | POA: Insufficient documentation

## 2013-02-08 DIAGNOSIS — F411 Generalized anxiety disorder: Secondary | ICD-10-CM | POA: Insufficient documentation

## 2013-02-08 DIAGNOSIS — Z8709 Personal history of other diseases of the respiratory system: Secondary | ICD-10-CM | POA: Insufficient documentation

## 2013-02-08 DIAGNOSIS — T451X4A Poisoning by antineoplastic and immunosuppressive drugs, undetermined, initial encounter: Secondary | ICD-10-CM | POA: Insufficient documentation

## 2013-02-08 DIAGNOSIS — E860 Dehydration: Secondary | ICD-10-CM | POA: Insufficient documentation

## 2013-02-08 DIAGNOSIS — T50905A Adverse effect of unspecified drugs, medicaments and biological substances, initial encounter: Secondary | ICD-10-CM

## 2013-02-08 DIAGNOSIS — Z8739 Personal history of other diseases of the musculoskeletal system and connective tissue: Secondary | ICD-10-CM | POA: Insufficient documentation

## 2013-02-08 DIAGNOSIS — R112 Nausea with vomiting, unspecified: Secondary | ICD-10-CM | POA: Insufficient documentation

## 2013-02-08 DIAGNOSIS — C50919 Malignant neoplasm of unspecified site of unspecified female breast: Secondary | ICD-10-CM | POA: Insufficient documentation

## 2013-02-08 DIAGNOSIS — T451X5A Adverse effect of antineoplastic and immunosuppressive drugs, initial encounter: Secondary | ICD-10-CM | POA: Insufficient documentation

## 2013-02-08 LAB — COMPREHENSIVE METABOLIC PANEL
Albumin: 3.4 g/dL — ABNORMAL LOW (ref 3.5–5.2)
BUN: 15 mg/dL (ref 6–23)
Chloride: 92 mEq/L — ABNORMAL LOW (ref 96–112)
Creatinine, Ser: 0.61 mg/dL (ref 0.50–1.10)
GFR calc non Af Amer: >90 mL/min (ref >90)
Total Bilirubin: 0.9 mg/dL (ref 0.3–1.2)

## 2013-02-08 LAB — CBC WITH DIFFERENTIAL/PLATELET
Lymphocytes Relative: 9 % — ABNORMAL LOW (ref 12–46)
Lymphs Abs: 1.3 10*3/uL (ref 0.7–4.0)
MCV: 87.1 fL (ref 78.0–100.0)
Neutro Abs: 13 10*3/uL — ABNORMAL HIGH (ref 1.7–7.7)
Neutrophils Relative %: 88 % — ABNORMAL HIGH (ref 43–77)
Platelets: 241 10*3/uL (ref 150–400)
RBC: 4.02 MIL/uL (ref 3.87–5.11)
WBC: 14.8 10*3/uL — ABNORMAL HIGH (ref 4.0–10.5)

## 2013-02-08 LAB — LIPASE, BLOOD: Lipase: 33 U/L (ref 11–59)

## 2013-02-08 MED ORDER — HEPARIN SOD (PORK) LOCK FLUSH 100 UNIT/ML IV SOLN
500.0000 [IU] | Freq: Once | INTRAVENOUS | Status: AC
  Administered 2013-02-08: 500 [IU]
  Filled 2013-02-08: qty 5

## 2013-02-08 MED ORDER — ONDANSETRON HCL 4 MG/2ML IJ SOLN
4.0000 mg | Freq: Once | INTRAMUSCULAR | Status: AC
  Administered 2013-02-08: 4 mg via INTRAVENOUS
  Filled 2013-02-08: qty 2

## 2013-02-08 MED ORDER — SODIUM CHLORIDE 0.9 % IV BOLUS (SEPSIS)
1000.0000 mL | Freq: Once | INTRAVENOUS | Status: AC
  Administered 2013-02-08: 1000 mL via INTRAVENOUS

## 2013-02-08 NOTE — ED Provider Notes (Signed)
CSN: [id number]     Arrival date & time 02/08/13  0107 History   First MD Initiated Contact with Patient 02/08/13 0254     Chief Complaint  Patient presents with  . Nausea   (Consider location/radiation/quality/duration/timing/severity/associated sxs/prior Treatment) The history is provided by the patient.  Carrie Berry is a 62 y.o. female hx of stage 4 breast cancer, here with nausea. She started chemotherapy on Tuesday. She also is on oral experimental drug for stage IV breast cancer. She's been having nausea and vomiting today. Unable to keep fluids down. She attempted to take her Compazine and Zofran and Ativan with no relief. Meds any abdominal pain or chest pain.    Past Medical History  Diagnosis Date  . Recurrent sinus infections   . Arthritis   . PONV (postoperative nausea and vomiting)   . Anxiety   . Cancer     breast   Past Surgical History  Procedure Laterality Date  . Tubal ligation    . Tubal ligation  1985  . Portacath placement N/A 01/10/2013    Procedure: ULTRASOUND GUIDED PORT-A-CATH INSERTION WITH FLUOROSCOPY;  Surgeon: Adolph Pollack, MD;  Location: [facility] OR;  Service: General;  Laterality: N/A;   Family History  Problem Relation Age of Onset  . Bladder Cancer Father   . Colon cancer Maternal Grandmother    History  Substance Use Topics  . Smoking status: Never Smoker   . Smokeless tobacco: Not on file  . Alcohol Use: No   OB History   Grav Para Term Preterm Abortions TAB SAB Ect Mult Living                 Review of Systems  Gastrointestinal: Positive for nausea and vomiting.  All other systems reviewed and are negative.    Allergies  Codeine and Penicillins  Home Medications   Current Outpatient Rx  Name  Route  Sig  Dispense  Refill  . acetaminophen (TYLENOL) 325 MG tablet   Oral   Take 650 mg by mouth every 8 (eight) hours as needed for pain.         . cetirizine (ZYRTEC) 10 MG tablet   Oral   Take 10 mg by mouth daily.          Marland Kitchen dextrose 5 % SOLN 50 mL with INVESTIGATIONAL DRUG SIMPLE RECORD   Oral   Take 1 tablet by mouth every 12 (twelve) hours. [facility] Study #[id number] (100mg )         . dextrose 5 % SOLN 50 mL with INVESTIGATIONAL DRUG SIMPLE RECORD   Intravenous   Inject 1 tablet into the vein every 12 (twelve) hours. [facility] Study#[id number] (50mg )         . lidocaine-prilocaine (EMLA) cream   Topical   Apply topically as needed.   30 g   0   . LORazepam (ATIVAN) 1 MG tablet   Oral   Take 1 mg by mouth every 8 (eight) hours as needed for anxiety.          . Magnesium 400 MG CAPS   Oral   Take 400 mg by mouth daily.         . Multiple Vitamin (MULTIVITAMIN WITH MINERALS) TABS tablet   Oral   Take 1 tablet by mouth daily.         . ondansetron (ZOFRAN) 8 MG tablet   Oral   Take 8 mg by mouth every 12 (twelve)  hours as needed for nausea.          Marland Kitchen PATADAY 0.2 % SOLN   Both Eyes   Place 1 drop into both eyes daily as needed (dry eyes).          . prochlorperazine (COMPAZINE) 10 MG tablet   Oral   Take 10 mg by mouth every 6 (six) hours as needed (for nausea).          . traMADol (ULTRAM) 50 MG tablet   Oral   Take 50 mg by mouth every 6 (six) hours as needed for pain.          Marland Kitchen triamcinolone (NASACORT) 55 MCG/ACT nasal inhaler   Nasal   Place 2 sprays into the nose daily.          BP 108/67  Pulse 74  Temp(Src) 97.8 F (36.6 C) (Oral)  Resp 16  SpO2 98% Physical Exam  Nursing note and vitals reviewed. Constitutional: She is oriented to person, place, and time. She appears well-developed and well-nourished.  Chronically ill   HENT:  Head: Normocephalic.  MM slightly dry   Eyes: Conjunctivae are normal. Pupils are equal, round, and reactive to light.  Neck: Normal range of motion. Neck supple.  Cardiovascular: Normal rate, regular rhythm and normal heart sounds.   Pulmonary/Chest: Effort normal and breath sounds  normal. No respiratory distress. She has no wheezes. She has no rales.  Power port with no surrounding erythema   Abdominal: Bowel sounds are normal. She exhibits no distension. There is no tenderness. There is no rebound.  Musculoskeletal: Normal range of motion.  Neurological: She is alert and oriented to person, place, and time.  Skin: Skin is warm and dry.  Psychiatric: She has a normal mood and affect. Her behavior is normal. Judgment and thought content normal.    ED Course  Procedures (including critical care time) Labs Review Labs Reviewed  COMPREHENSIVE METABOLIC PANEL - Abnormal; Notable for the following:    Sodium 129 (*)    Chloride 92 (*)    Glucose, Bld 111 (*)    Albumin 3.4 (*)    All other components within normal limits  CBC WITH DIFFERENTIAL - Abnormal; Notable for the following:    WBC 14.8 (*)    HCT 35.0 (*)    Neutrophils Relative % 88 (*)    Neutro Abs 13.0 (*)    Lymphocytes Relative 9 (*)    Monocytes Relative 0 (*)    Monocytes Absolute 0.0 (*)    All other components within normal limits  LIPASE, BLOOD   Imaging Review No results found.  MDM  No diagnosis found. Carrie Berry is a 62 y.o. female here with vomiting. Likely side effect from chemotherapy agent. Will check labs and hydrate patient and give antiemetics and reassess.   4:40 AM Felt better with IVF and zofran. Tolerated PO. She has PO zofran and compazine at home. Recommend taking them and stay hydrated.     Richardean Canal, MD 02/08/13 [phone]

## 2013-02-08 NOTE — ED Notes (Signed)
Pt had her first chemotherapy tx on Tuesday of this week.

## 2013-02-08 NOTE — ED Notes (Signed)
The pt has breast cancer and has started chemotherapy Tuesday at [facility].  She is getting an experimental drug from them.  No nausea med has helped..  She has a portacath a power port.  She has had a headache since yesterday also

## 2013-02-08 NOTE — ED Notes (Signed)
Her last nausea med was ativan

## 2013-02-10 NOTE — Progress Notes (Signed)
This encounter was created in error - please disregard.

## 2013-02-12 ENCOUNTER — Ambulatory Visit: Payer: BC Managed Care – PPO

## 2013-02-14 ENCOUNTER — Telehealth: Payer: Self-pay | Admitting: Oncology

## 2013-02-14 NOTE — Telephone Encounter (Signed)
Returned pts call re cx 10/7 lb. Per pt she had a little set back and labs were drawn today @ [facility] so 10/7 is not needed. Per pt if [facility] needs anything else she's sure they will call us. Message to desk nurse.

## 2013-02-18 ENCOUNTER — Other Ambulatory Visit: Payer: BC Managed Care – PPO | Admitting: Lab

## 2013-02-26 ENCOUNTER — Other Ambulatory Visit: Payer: Self-pay | Admitting: Oncology

## 2013-02-26 DIAGNOSIS — C50919 Malignant neoplasm of unspecified site of unspecified female breast: Secondary | ICD-10-CM

## 2013-02-27 ENCOUNTER — Telehealth: Payer: Self-pay | Admitting: *Deleted

## 2013-02-27 NOTE — Telephone Encounter (Signed)
sw pt gv appt for 03/03/13 @ 11am and ov@ 11:30am. Pt is aware...td

## 2013-02-28 ENCOUNTER — Emergency Department (HOSPITAL_COMMUNITY)
Admission: EM | Admit: 2013-02-28 | Discharge: 2013-03-01 | Disposition: A | Discharge status: Home / Self Care | Payer: BC Managed Care – PPO | Attending: Emergency Medicine | Admitting: Emergency Medicine

## 2013-02-28 ENCOUNTER — Encounter (HOSPITAL_COMMUNITY): Payer: Self-pay | Admitting: Emergency Medicine

## 2013-02-28 DIAGNOSIS — R197 Diarrhea, unspecified: Secondary | ICD-10-CM | POA: Insufficient documentation

## 2013-02-28 DIAGNOSIS — F411 Generalized anxiety disorder: Secondary | ICD-10-CM | POA: Insufficient documentation

## 2013-02-28 DIAGNOSIS — Z8709 Personal history of other diseases of the respiratory system: Secondary | ICD-10-CM | POA: Insufficient documentation

## 2013-02-28 DIAGNOSIS — Z79899 Other long term (current) drug therapy: Secondary | ICD-10-CM | POA: Insufficient documentation

## 2013-02-28 DIAGNOSIS — Z853 Personal history of malignant neoplasm of breast: Secondary | ICD-10-CM | POA: Insufficient documentation

## 2013-02-28 DIAGNOSIS — R112 Nausea with vomiting, unspecified: Secondary | ICD-10-CM | POA: Insufficient documentation

## 2013-02-28 DIAGNOSIS — T451X5A Adverse effect of antineoplastic and immunosuppressive drugs, initial encounter: Secondary | ICD-10-CM | POA: Insufficient documentation

## 2013-02-28 DIAGNOSIS — R51 Headache: Secondary | ICD-10-CM | POA: Insufficient documentation

## 2013-02-28 DIAGNOSIS — Z8739 Personal history of other diseases of the musculoskeletal system and connective tissue: Secondary | ICD-10-CM | POA: Insufficient documentation

## 2013-02-28 DIAGNOSIS — Z880 Allergy status to penicillin: Secondary | ICD-10-CM | POA: Insufficient documentation

## 2013-02-28 DIAGNOSIS — IMO0002 Reserved for concepts with insufficient information to code with codable children: Secondary | ICD-10-CM | POA: Insufficient documentation

## 2013-02-28 LAB — CBC WITH DIFFERENTIAL/PLATELET
Basophils Absolute: 0 10*3/uL (ref 0.0–0.1)
Basophils Relative: 0 % (ref 0–1)
HCT: 32.7 % — ABNORMAL LOW (ref 36.0–46.0)
MCHC: 34.6 g/dL (ref 30.0–36.0)
Monocytes Absolute: 0.9 10*3/uL (ref 0.1–1.0)
Monocytes Relative: 10 % (ref 3–12)
Neutro Abs: 7.1 10*3/uL (ref 1.7–7.7)
Neutrophils Relative %: 76 % (ref 43–77)
Platelets: 219 10*3/uL (ref 150–400)
RDW: 12.7 % (ref 11.5–15.5)

## 2013-02-28 LAB — COMPREHENSIVE METABOLIC PANEL
ALT: 18 U/L (ref 0–35)
AST: 19 U/L (ref 0–37)
Albumin: 3.3 g/dL — ABNORMAL LOW (ref 3.5–5.2)
Alkaline Phosphatase: 102 U/L (ref 39–117)
Chloride: 99 mEq/L (ref 96–112)
Creatinine, Ser: 0.59 mg/dL (ref 0.50–1.10)
GFR calc Af Amer: >90 mL/min (ref >90)
Potassium: 3.4 mEq/L — ABNORMAL LOW (ref 3.5–5.1)
Sodium: 136 mEq/L (ref 135–145)
Total Bilirubin: 0.3 mg/dL (ref 0.3–1.2)

## 2013-02-28 MED ORDER — SODIUM CHLORIDE 0.9 % IV SOLN
1000.0000 mL | Freq: Once | INTRAVENOUS | Status: AC
  Administered 2013-02-28: 1000 mL via INTRAVENOUS

## 2013-02-28 MED ORDER — POTASSIUM CHLORIDE CRYS ER 20 MEQ PO TBCR
40.0000 meq | EXTENDED_RELEASE_TABLET | Freq: Once | ORAL | Status: AC
  Administered 2013-02-28: 40 meq via ORAL
  Filled 2013-02-28: qty 2

## 2013-02-28 MED ORDER — ONDANSETRON HCL 4 MG/2ML IJ SOLN
4.0000 mg | Freq: Once | INTRAMUSCULAR | Status: AC
  Administered 2013-02-28: 4 mg via INTRAVENOUS
  Filled 2013-02-28: qty 2

## 2013-02-28 MED ORDER — SODIUM CHLORIDE 0.9 % IV SOLN: 1000.0000 mL | INTRAVENOUS | Status: DC

## 2013-02-28 NOTE — ED Provider Notes (Signed)
CSN: [id number]     Arrival date & time 02/28/13  2114 History   First MD Initiated Contact with Patient 02/28/13 2120     Chief Complaint  Patient presents with  . Fatigue    HPI Pt has been receiving chemotherapy at [facility] for breast cancer.  She was on a trial drug and last received treatment on Monday.  She has been feeling weak and fatigued since then.  She is also feeling depressed and naueated.  She has not vomited but continues to feel nauseated.  Pt states she is very tired and just wants to sleep.  Her mouth feels very dry like she has cotton in it.  She was told by her doctors the symptoms may last couple of days but it has not gotten any better. She called the doctor on call and was told to take her medications and if not getting any better to go to the closest emergency department.  She denies fevers, chest pain or abdominal pain. She has not noticed any rashes. Past Medical History  Diagnosis Date  . Recurrent sinus infections   . Arthritis   . PONV (postoperative nausea and vomiting)   . Anxiety   . Cancer     breast   Past Surgical History  Procedure Laterality Date  . Tubal ligation    . Tubal ligation  1985  . Portacath placement N/A 01/10/2013    Procedure: ULTRASOUND GUIDED PORT-A-CATH INSERTION WITH FLUOROSCOPY;  Surgeon: Adolph Pollack, MD;  Location: [facility] OR;  Service: General;  Laterality: N/A;   Family History  Problem Relation Age of Onset  . Bladder Cancer Father   . Colon cancer Maternal Grandmother    History  Substance Use Topics  . Smoking status: Never Smoker   . Smokeless tobacco: Not on file  . Alcohol Use: No   OB History   Grav Para Term Preterm Abortions TAB SAB Ect Mult Living                 Review of Systems  Constitutional: Negative for fever.  Gastrointestinal: Positive for nausea and diarrhea. Negative for abdominal pain.  Genitourinary: Negative for dysuria.  Neurological: Positive for headaches.  All other systems reviewed and  are negative.    Allergies  Codeine and Penicillins  Home Medications   Current Outpatient Rx  Name  Route  Sig  Dispense  Refill  . acetaminophen (TYLENOL) 325 MG tablet   Oral   Take 650 mg by mouth every 8 (eight) hours as needed for pain.         Marland Kitchen lidocaine-prilocaine (EMLA) cream   Topical   Apply topically as needed.   30 g   0   . LORazepam (ATIVAN) 1 MG tablet   Oral   Take 1 mg by mouth every 8 (eight) hours as needed for anxiety.          . Magnesium 400 MG CAPS   Oral   Take 400 mg by mouth daily.         . Multiple Vitamin (MULTIVITAMIN WITH MINERALS) TABS tablet   Oral   Take 1 tablet by mouth daily.         . ondansetron (ZOFRAN) 8 MG tablet   Oral   Take 8 mg by mouth every 12 (twelve) hours as needed for nausea.          Marland Kitchen PATADAY 0.2 % SOLN   Both Eyes   Place 1 drop into both  eyes daily as needed (dry eyes).          . prochlorperazine (COMPAZINE) 10 MG tablet   Oral   Take 10 mg by mouth every 6 (six) hours as needed (for nausea).          . traMADol (ULTRAM) 50 MG tablet   Oral   Take 50 mg by mouth every 6 (six) hours as needed for pain.          Marland Kitchen triamcinolone (NASACORT) 55 MCG/ACT nasal inhaler   Nasal   Place 2 sprays into the nose daily.         Marland Kitchen dextrose 5 % SOLN 50 mL with INVESTIGATIONAL DRUG SIMPLE RECORD   Oral   Take 1 tablet by mouth every 12 (twelve) hours. [facility] #[id number] (100mg )         . dextrose 5 % SOLN 50 mL with INVESTIGATIONAL DRUG SIMPLE RECORD   Intravenous   Inject 1 tablet into the vein every 12 (twelve) hours. [facility] Study#[id number] (50mg )          BP 127/66  Pulse 94  Temp(Src) 98.8 F (37.1 C) (Oral)  Resp 18  SpO2 98% Physical Exam  Nursing note and vitals reviewed. Constitutional: No distress.  HENT:  Head: Normocephalic and atraumatic.  Right Ear: External ear normal.  Left Ear: External ear normal.  Mouth/Throat: No  oropharyngeal exudate (no oral lesions).  Eyes: Conjunctivae are normal. Right eye exhibits no discharge. Left eye exhibits no discharge. No scleral icterus.  Neck: Neck supple. No tracheal deviation present.  Cardiovascular: Normal rate, regular rhythm and intact distal pulses.   Pulmonary/Chest: Effort normal and breath sounds normal. No stridor. No respiratory distress. She has no wheezes. She has no rales.  Port-A-Cath right anterior chest wall, no erythema  Abdominal: Soft. Bowel sounds are normal. She exhibits no distension. There is no tenderness. There is no rebound and no guarding.  Musculoskeletal: She exhibits no edema and no tenderness.  Neurological: She is alert. She has normal strength. No sensory deficit. Cranial nerve deficit:  no gross defecits noted. She exhibits normal muscle tone. She displays no seizure activity. Coordination normal.  Skin: Skin is warm and dry. No rash noted. She is not diaphoretic.  Psychiatric: She has a normal mood and affect.    ED Course  Procedures (including critical care time) Labs Review Labs Reviewed  CBC WITH DIFFERENTIAL - Abnormal; Notable for the following:    RBC 3.73 (*)    Hemoglobin 11.3 (*)    HCT 32.7 (*)    Lymphocytes Relative 11 (*)    All other components within normal limits  COMPREHENSIVE METABOLIC PANEL - Abnormal; Notable for the following:    Potassium 3.4 (*)    Glucose, Bld 107 (*)    Albumin 3.3 (*)    All other components within normal limits  URINALYSIS, ROUTINE W REFLEX MICROSCOPIC   Imaging Review No results found.  EKG Interpretation   None      Medications  0.9 %  sodium chloride infusion (0 mLs Intravenous Stopped 02/28/13 2337)    Followed by  0.9 %  sodium chloride infusion (1,000 mLs Intravenous New Bag/Given 02/28/13 2338)    Followed by  0.9 %  sodium chloride infusion (not administered)  ondansetron (ZOFRAN) injection 4 mg (4 mg Intravenous Given 02/28/13 2233)  potassium chloride SA  (K-DUR,KLOR-CON) CR tablet 40 mEq (40 mEq Oral Given 02/28/13 2339)  MDM   1. Chemotherapy induced nausea and vomiting, initial encounter    No sign of significant dehydration.  Her CBC is reassuring.  Pt was given IV fluids.  She has antiemetics at home.   Symptoms most likely related to her chemotherapy agents which should improve with time.      Celene Kras, MD 02/28/13 [phone]

## 2013-02-28 NOTE — ED Notes (Signed)
Pt is being treated for breast cancer and was seen in [place] on Monday and Tuesday  Pt states Tuesday she was given a liter of fluid instead of her infusion  Pt states she has been feeling weak all over, fatigued, thick tongued, depressed, nauseated, had a little vomiting yesterday but none today  Pt states she has an appt on Monday with the cancer center here

## 2013-03-01 MED ORDER — HEPARIN SOD (PORK) LOCK FLUSH 100 UNIT/ML IV SOLN
500.0000 [IU] | Freq: Once | INTRAVENOUS | Status: AC
  Administered 2013-03-01: 500 [IU]
  Filled 2013-03-01: qty 5

## 2013-03-03 ENCOUNTER — Ambulatory Visit (HOSPITAL_BASED_OUTPATIENT_CLINIC_OR_DEPARTMENT_OTHER): Payer: BC Managed Care – PPO | Admitting: Oncology

## 2013-03-03 ENCOUNTER — Other Ambulatory Visit: Payer: Self-pay | Admitting: *Deleted

## 2013-03-03 ENCOUNTER — Other Ambulatory Visit (HOSPITAL_BASED_OUTPATIENT_CLINIC_OR_DEPARTMENT_OTHER): Payer: BC Managed Care – PPO | Admitting: Lab

## 2013-03-03 ENCOUNTER — Other Ambulatory Visit: Payer: Self-pay | Admitting: Oncology

## 2013-03-03 VITALS — BP 125/78 | HR 101 | Temp 98.7°F | Resp 20 | Ht 63.5 in | Wt 129.7 lb

## 2013-03-03 DIAGNOSIS — C773 Secondary and unspecified malignant neoplasm of axilla and upper limb lymph nodes: Secondary | ICD-10-CM

## 2013-03-03 DIAGNOSIS — C50411 Malignant neoplasm of upper-outer quadrant of right female breast: Secondary | ICD-10-CM

## 2013-03-03 DIAGNOSIS — C7800 Secondary malignant neoplasm of unspecified lung: Secondary | ICD-10-CM

## 2013-03-03 DIAGNOSIS — C50919 Malignant neoplasm of unspecified site of unspecified female breast: Secondary | ICD-10-CM

## 2013-03-03 DIAGNOSIS — C50419 Malignant neoplasm of upper-outer quadrant of unspecified female breast: Secondary | ICD-10-CM

## 2013-03-03 DIAGNOSIS — Z171 Estrogen receptor negative status [ER-]: Secondary | ICD-10-CM

## 2013-03-03 DIAGNOSIS — C787 Secondary malignant neoplasm of liver and intrahepatic bile duct: Secondary | ICD-10-CM

## 2013-03-03 LAB — CBC WITH DIFFERENTIAL/PLATELET
Basophils Absolute: 0 10*3/uL (ref 0.0–0.1)
EOS%: 0 % (ref 0.0–7.0)
Eosinophils Absolute: 0 10*3/uL (ref 0.0–0.5)
HGB: 12.6 g/dL (ref 11.6–15.9)
LYMPH%: 7.6 % — ABNORMAL LOW (ref 14.0–49.7)
MCH: 29.4 pg (ref 25.1–34.0)
MCHC: 33.4 g/dL (ref 31.5–36.0)
MCV: 88.1 fL (ref 79.5–101.0)
MONO%: 9.3 % (ref 0.0–14.0)
NEUT#: 13.6 10*3/uL — ABNORMAL HIGH (ref 1.5–6.5)
Platelets: 406 10*3/uL — ABNORMAL HIGH (ref 145–400)
RBC: 4.29 10*6/uL (ref 3.70–5.45)
RDW: 12.9 % (ref 11.2–14.5)
lymph#: 1.2 10*3/uL (ref 0.9–3.3)

## 2013-03-03 LAB — COMPREHENSIVE METABOLIC PANEL (CC13)
AST: 19 U/L (ref 5–34)
Albumin: 3.7 g/dL (ref 3.5–5.0)
Alkaline Phosphatase: 110 U/L (ref 40–150)
Anion Gap: 12 mEq/L — ABNORMAL HIGH (ref 3–11)
BUN: 11 mg/dL (ref 7.0–26.0)
CO2: 25 mEq/L (ref 22–29)
Glucose: 106 mg/dl (ref 70–140)
Sodium: 138 mEq/L (ref 136–145)
Total Bilirubin: 0.43 mg/dL (ref 0.20–1.20)
Total Protein: 8.1 g/dL (ref 6.4–8.3)

## 2013-03-03 MED ORDER — FLUCONAZOLE 100 MG PO TABS: 100.0000 mg | ORAL_TABLET | Freq: Every day | ORAL | Status: DC

## 2013-03-03 MED ORDER — DEXAMETHASONE 4 MG PO TABS: 8.0000 mg | ORAL_TABLET | Freq: Two times a day (BID) | ORAL | Status: DC

## 2013-03-03 NOTE — Progress Notes (Signed)
Medication update.

## 2013-03-03 NOTE — Addendum Note (Signed)
Addended by: Laroy Apple E on: 03/03/2013 05:08 PM   Modules accepted: Orders, Medications

## 2013-03-03 NOTE — Progress Notes (Signed)
Patient ID: Carrie Berry, female   DOB: 05-31-1950, 62 y.o.   MRN: [id number] ID: Carrie Berry OB: August 17, 1950  MR#: [id number]  CSN#:[id number]  PCP: Katy Apo, MD GYN:   SU: Avel Peace OTHER MD: Lurline Hare, Danise Edge, Rick Cornella, Melodye Ped   HISTORY OF PRESENT ILLNESS: Carrie Berry noted a mass in her right breast December of 2013, but did not think much of it. It did grow some lower the summer, but she was keeping her grandchild at that time and was too busy so she did not bring it to Dr. Lynnette Caffey attention until August. He set her up for bilateral mammography and ultrasonography at [facility] 12/18/2012 and this measured, on the right, a large irregular lobulated mass in the upper outer quadrant which by ultrasound measured 5.7 cm. The right axilla showed some lymph nodes with thickened cortices. In the left breast there was a focal asymmetry at the depth, lateral to the nipple, and by ultrasound there was a 9 mm ill-defined hypoechoic lesion in this location. Biopsy of the left breast lesion showed only fibrocystic changes.  Biopsy of the right breast mass and right axillary adenopathy ([id number]) on 12/23/2012 showed both to be involved by invasive ductal carcinoma, grade 2, triple negative, with an MIB-1 of 86%.  MRI of the breast 12/27/2012 at [facility] imaging showed in the right breast a mass abutting the pectoralis muscle without enhancement of the muscle measuring 4.8 cm. The satellite nodule measuring approximately 3 mm was also noted superior to the mass and several abnormal and enlarged right axillary lymph nodes were noted, one of which appeared to be necrotic. The largest node measured 2.0 cm. Unfortunately, numerous bilateral pulmonary nodules were also noted.  The patient's subsequent history is as detailed below   INTERVAL HISTORY: Carrie Berry returns today accompanied by her husband [name] for followup of her breast cancer. The interval history since her last visit here is  complex. She went to [facility] for a second opinion and enrolled and there docetaxel/ gsi study, but was unable to tolerate the secretase inhibitor despite treatment interruptions and dose decrease this. Accordingly she is now off study and seeking standard of care therapy. In addition she has been to the emergency room twice, the first on September 27, a few days after her initial treatments at [facility], the second time 3 days ago, October 17, with complaints of nausea, fatigue, and restlessness.  REVIEW OF SYSTEMS: The chief problems she had with the treatment at [facility] was nausea, vomiting, decreased appetite, dehydration, severe and persistent headaches, insomnia, and fatigue. She felt very depressed. She developed thrush. She lost her hair. On the plus side, the pain she was having in the right breast resolved. At this point the biggest problem she has is feeling "antsy", meaning she is very restless, can't sit still, and still can't sleep although she is doing a little bit better with the help of Ambien. She describes her fatigue is severe. She denies changes in vision, gait imbalance, dizziness, or falls. She feels horse, and has pain on swallowing. She says her thrush is "still there". Her appetite is very poor. She had some Ensure this morning, took some Jell-O yesterday and vomited that up. She's trying to keep herself well hydrated. She prefers salty to sweet foods. Bowel movements are "not hard". A detailed review of systems today was otherwise stable.  PAST MEDICAL HISTORY: Past Medical History  Diagnosis Date  . Recurrent sinus infections   . Arthritis   .  PONV (postoperative nausea and vomiting)   . Anxiety   . Cancer     breast    PAST SURGICAL HISTORY: Past Surgical History  Procedure Laterality Date  . Tubal ligation    . Tubal ligation  1985  . Portacath placement N/A 01/10/2013    Procedure: ULTRASOUND GUIDED PORT-A-CATH INSERTION WITH FLUOROSCOPY;  Surgeon: Adolph Pollack, MD;   Location: [facility] OR;  Service: General;  Laterality: N/A;    FAMILY HISTORY Family History  Problem Relation Age of Onset  . Bladder Cancer Father   . Colon cancer Maternal Grandmother    the patient's parents are living, both in their 68s. The patient's father was diagnosed with bladder cancer the age of 40. The patient's mother's mother had some type of gastrointestinal cancer. The patient had one brother, no sisters. There is no history of breast or ovarian cancer in the family other than a cousin on the father's side who was diagnosed with breast cancer apparently before the age of 72.  GYNECOLOGIC HISTORY:  Menarche age 54, first live birth age 43, the patient is GX P2. She went through menopause in 2008. She did not take hormone replacement. She is birth control for approximately 22 years remotely.  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 Her husband Dahiana Kulak [name]"), is vice Conservation officer, nature for [employer]. The patient's daughter [name] is a stay-at-home mom in [place], the patient's son Yina Riviere unfortunately died in an automobile accident in December of 2011. The patient has 2 grandchildren. She attends to a local [employer].    ADVANCED DIRECTIVES: Not in place   HEALTH MAINTENANCE: History  Substance Use Topics  . Smoking status: Never Smoker   . Smokeless tobacco: Not on file  . Alcohol Use: No     Colonoscopy: 2005  PAP:  Bone density:  Lipid panel:  Allergies  Allergen Reactions  . Codeine Nausea And Vomiting    "violently ill"  . Penicillins Rash    Childhood reaction -    Current Outpatient Prescriptions  Medication Sig Dispense Refill  . acetaminophen (TYLENOL) 325 MG tablet Take 650 mg by mouth every 8 (eight) hours as needed for pain.      Marland Kitchen dextrose 5 % SOLN 50 mL with INVESTIGATIONAL DRUG SIMPLE RECORD Take 1 tablet by mouth every 12  (twelve) hours. [facility] #[id number] (100mg )      . dextrose 5 % SOLN 50 mL with INVESTIGATIONAL DRUG SIMPLE RECORD Inject 1 tablet into the vein every 12 (twelve) hours. [facility] Study#[id number] (50mg )      . lidocaine-prilocaine (EMLA) cream Apply topically as needed.  30 g  0  . LORazepam (ATIVAN) 1 MG tablet Take 1 mg by mouth every 8 (eight) hours as needed for anxiety.       . Magnesium 400 MG CAPS Take 400 mg by mouth daily.      . Multiple Vitamin (MULTIVITAMIN WITH MINERALS) TABS tablet Take 1 tablet by mouth daily.      . ondansetron (ZOFRAN) 8 MG tablet Take 8 mg by mouth every 12 (twelve) hours as needed for nausea.       Marland Kitchen PATADAY 0.2 % SOLN Place 1 drop into both eyes daily as needed (dry eyes).       . prochlorperazine (COMPAZINE) 10 MG tablet Take 10 mg by mouth every 6 (six) hours as needed (  for nausea).       . traMADol (ULTRAM) 50 MG tablet Take 50 mg by mouth every 6 (six) hours as needed for pain.       Marland Kitchen triamcinolone (NASACORT) 55 MCG/ACT nasal inhaler Place 2 sprays into the nose daily.       No current facility-administered medications for this visit.    OBJECTIVE: Middle-aged white woman who appears stated age 71 Vitals:   03/03/13 1119  BP: 125/78  Pulse: 101  Temp: 98.7 F (37.1 C)  Resp: 20     Body mass index is 22.61 kg/(m^2).    ECOG FS: 2  Sclerae unicteric, pupils equal round and reactive to light Oropharynx shows mild thrush, dentition is fair No cervical or supraclavicular adenopathy Lungs no rales or rhonchi Heart regular rate and rhythm Abd soft, nontender, positive bowel sounds MSK no focal spinal tenderness, no upper extremity lymphedema Neuro: non-focal, well-oriented, anxious affect Breasts: The arge palpable mass in the upper outer quadrant of the right breast appears unchanged, to my recollection. It measures approximately 4 cm by palpation. There is no skin erythema, the mass is movable, and there  is no nipple retraction. The right axilla shows no palpable masses. The left breast is benign. Skin:  port is in place, without erythema or swelling.    LAB RESULTS:  CMP     Component Value Date/Time   NA 136 02/28/2013 2230   NA 144 01/01/2013 0750   K 3.4* 02/28/2013 2230   K 4.3 01/01/2013 0750   CL 99 02/28/2013 2230   CO2 28 02/28/2013 2230   CO2 27 01/01/2013 0750   GLUCOSE 107* 02/28/2013 2230   GLUCOSE 100 01/01/2013 0750   BUN 8 02/28/2013 2230   BUN 16.6 01/01/2013 0750   CREATININE 0.59 02/28/2013 2230   CREATININE 0.8 01/01/2013 0750   CALCIUM 9.0 02/28/2013 2230   CALCIUM 9.9 01/01/2013 0750   PROT 6.9 02/28/2013 2230   PROT 7.9 01/01/2013 0750   ALBUMIN 3.3* 02/28/2013 2230   ALBUMIN 3.8 01/01/2013 0750   AST 19 02/28/2013 2230   AST 18 01/01/2013 0750   ALT 18 02/28/2013 2230   ALT 17 01/01/2013 0750   ALKPHOS 102 02/28/2013 2230   ALKPHOS 101 01/01/2013 0750   BILITOT 0.3 02/28/2013 2230   BILITOT 0.59 01/01/2013 0750   GFRNONAA >90 02/28/2013 2230   GFRAA >90 02/28/2013 2230    I No results found for this basename: SPEP,  UPEP,   kappa and lambda light chains    Lab Results  Component Value Date   WBC 16.4* 03/03/2013   NEUTROABS 13.6* 03/03/2013   HGB 12.6 03/03/2013   HCT 37.8 03/03/2013   MCV 88.1 03/03/2013   PLT 406* 03/03/2013      Chemistry      Component Value Date/Time   NA 136 02/28/2013 2230   NA 144 01/01/2013 0750   K 3.4* 02/28/2013 2230   K 4.3 01/01/2013 0750   CL 99 02/28/2013 2230   CO2 28 02/28/2013 2230   CO2 27 01/01/2013 0750   BUN 8 02/28/2013 2230   BUN 16.6 01/01/2013 0750   CREATININE 0.59 02/28/2013 2230   CREATININE 0.8 01/01/2013 0750      Component Value Date/Time   CALCIUM 9.0 02/28/2013 2230   CALCIUM 9.9 01/01/2013 0750   ALKPHOS 102 02/28/2013 2230   ALKPHOS 101 01/01/2013 0750   AST 19 02/28/2013 2230   AST 18 01/01/2013 0750   ALT 18 02/28/2013  2230   ALT 17 01/01/2013 0750   BILITOT 0.3 02/28/2013 2230    BILITOT 0.59 01/01/2013 0750       No results found for this basename: LABCA2    No components found with this basename: LABCA125    No results found for this basename: INR,  in the last 168 hours  Urinalysis No results found for this basename: colorurine,  appearanceur,  labspec,  phurine,  glucoseu,  hgbur,  bilirubinur,  ketonesur,  proteinur,  urobilinogen,  nitrite,  leukocytesur    STUDIES: No results found.  ASSESSMENT: 62 y.o. Carrie Berry, Carrie Berry woman status post right breast upper outer quadrant and right axillary lymph node biopsy 12/23/2012 for a clinical T3 N1 M1, stage IV invasive ductal carcinoma, grade 3, triple negative, with an MIB-1 of 86%.  (1) right liver lobe biopsy 01/21/2013 confirms metastatic adenocarcinoma; scans show involvement of the liver, lungs, and likely bone.   (2) enrolled in [facility] study [id number] (docetaxel + oral gamma secretase inibitor [id number]), received one cycle starting 02/04/2013  but withdrew because of poor tolerance despite treatment interruptions and decreased dosing of the oral component  (3) Abraxane to be started 03/19/2013, the goal being for treatment day 1 and day 8 of each 21 day cycle, with restaging after 4 cycles.  PLAN: We spent well over 45 minutes today going over Nithila's situation. I gave her high marks for enrolling in the [facility] study and trying really hard to put up with the side effects she experienced, but clearly there was no alternative but discontinuation and today we talked about alternatives. Given her experience so far I would prefer to go with Abraxane rather than Taxol. She understands the possible side effects toxicities and complications of this agent, which is however in general well tolerated. We talked specifically about the risk of peripheral neuropathy, which may be a permanent problem if it develops. I would like her to be a little bit more recovered before we start, and accordingly we will be starting treatment the  first Wednesday in November.  It has been 2 months since we obtained a CT scan, so we will need a new CT of the chest for baseline purposes and this is being set up for November 3.  In terms of supportive care, I suggested she stop the Compazine, which I think is the drug making her "antsy". After 2 days off Compazine she can start tapering the lorazepam.. She is welcome to continue the Ambien at bedtime but she understands if she uses it regularly she may develop tolerance and the drug may become ineffective. Note that she also does not tolerate Zofran, which causes her severe headaches, so we will have to rely chiefly on dexamethasone and lorazepam for nausea control once we start the Abraxane  Nour has a good understanding of the overall plan. She knows the goal of treatment is control. She will call for any problems that may develop before her next visit here.   Lowella Dell, MD   03/03/2013 11:27 AM

## 2013-03-04 ENCOUNTER — Telehealth: Payer: Self-pay | Admitting: Oncology

## 2013-03-04 ENCOUNTER — Telehealth: Payer: Self-pay | Admitting: *Deleted

## 2013-03-04 NOTE — Telephone Encounter (Signed)
Per staff message and POF I have scheduled appts.  JMW  

## 2013-03-17 ENCOUNTER — Other Ambulatory Visit: Payer: Self-pay | Admitting: Oncology

## 2013-03-17 ENCOUNTER — Ambulatory Visit (HOSPITAL_COMMUNITY)
Admission: RE | Admit: 2013-03-17 | Discharge: 2013-03-17 | Disposition: A | Discharge status: Home / Self Care | Payer: BC Managed Care – PPO | Source: Ambulatory Visit | Attending: Oncology | Admitting: Oncology

## 2013-03-17 DIAGNOSIS — Z9221 Personal history of antineoplastic chemotherapy: Secondary | ICD-10-CM | POA: Insufficient documentation

## 2013-03-17 DIAGNOSIS — R599 Enlarged lymph nodes, unspecified: Secondary | ICD-10-CM | POA: Insufficient documentation

## 2013-03-17 DIAGNOSIS — R079 Chest pain, unspecified: Secondary | ICD-10-CM | POA: Insufficient documentation

## 2013-03-17 DIAGNOSIS — K7689 Other specified diseases of liver: Secondary | ICD-10-CM | POA: Insufficient documentation

## 2013-03-17 DIAGNOSIS — C50411 Malignant neoplasm of upper-outer quadrant of right female breast: Secondary | ICD-10-CM

## 2013-03-17 DIAGNOSIS — C50919 Malignant neoplasm of unspecified site of unspecified female breast: Secondary | ICD-10-CM | POA: Insufficient documentation

## 2013-03-17 MED ORDER — IOHEXOL 300 MG/ML  SOLN
80.0000 mL | Freq: Once | INTRAMUSCULAR | Status: AC | PRN
  Administered 2013-03-17: 80 mL via INTRAVENOUS

## 2013-03-18 ENCOUNTER — Other Ambulatory Visit: Payer: Self-pay | Admitting: Physician Assistant

## 2013-03-18 DIAGNOSIS — C50411 Malignant neoplasm of upper-outer quadrant of right female breast: Secondary | ICD-10-CM

## 2013-03-19 ENCOUNTER — Ambulatory Visit (HOSPITAL_BASED_OUTPATIENT_CLINIC_OR_DEPARTMENT_OTHER): Payer: BC Managed Care – PPO | Admitting: Physician Assistant

## 2013-03-19 ENCOUNTER — Telehealth: Payer: Self-pay | Admitting: *Deleted

## 2013-03-19 ENCOUNTER — Encounter (INDEPENDENT_AMBULATORY_CARE_PROVIDER_SITE_OTHER): Payer: Self-pay

## 2013-03-19 ENCOUNTER — Other Ambulatory Visit (HOSPITAL_BASED_OUTPATIENT_CLINIC_OR_DEPARTMENT_OTHER): Payer: BC Managed Care – PPO | Admitting: Lab

## 2013-03-19 ENCOUNTER — Ambulatory Visit (HOSPITAL_BASED_OUTPATIENT_CLINIC_OR_DEPARTMENT_OTHER): Payer: BC Managed Care – PPO

## 2013-03-19 ENCOUNTER — Encounter: Payer: Self-pay | Admitting: Physician Assistant

## 2013-03-19 ENCOUNTER — Encounter: Payer: Self-pay | Admitting: Oncology

## 2013-03-19 VITALS — BP 116/70 | HR 83 | Temp 98.3°F | Resp 20 | Ht 63.5 in | Wt 130.1 lb

## 2013-03-19 DIAGNOSIS — C50419 Malignant neoplasm of upper-outer quadrant of unspecified female breast: Secondary | ICD-10-CM

## 2013-03-19 DIAGNOSIS — R0981 Nasal congestion: Secondary | ICD-10-CM | POA: Insufficient documentation

## 2013-03-19 DIAGNOSIS — C50411 Malignant neoplasm of upper-outer quadrant of right female breast: Secondary | ICD-10-CM

## 2013-03-19 DIAGNOSIS — C7800 Secondary malignant neoplasm of unspecified lung: Secondary | ICD-10-CM

## 2013-03-19 DIAGNOSIS — Z5111 Encounter for antineoplastic chemotherapy: Secondary | ICD-10-CM

## 2013-03-19 DIAGNOSIS — K219 Gastro-esophageal reflux disease without esophagitis: Secondary | ICD-10-CM | POA: Insufficient documentation

## 2013-03-19 DIAGNOSIS — C50919 Malignant neoplasm of unspecified site of unspecified female breast: Secondary | ICD-10-CM | POA: Insufficient documentation

## 2013-03-19 DIAGNOSIS — C50911 Malignant neoplasm of unspecified site of right female breast: Secondary | ICD-10-CM

## 2013-03-19 DIAGNOSIS — C787 Secondary malignant neoplasm of liver and intrahepatic bile duct: Secondary | ICD-10-CM

## 2013-03-19 DIAGNOSIS — Z171 Estrogen receptor negative status [ER-]: Secondary | ICD-10-CM

## 2013-03-19 LAB — COMPREHENSIVE METABOLIC PANEL (CC13)
Anion Gap: 11 mEq/L (ref 3–11)
CO2: 25 mEq/L (ref 22–29)
Calcium: 9.8 mg/dL (ref 8.4–10.4)
Creatinine: 0.7 mg/dL (ref 0.6–1.1)
Glucose: 108 mg/dl (ref 70–140)
Potassium: 4.2 mEq/L (ref 3.5–5.1)
Sodium: 140 mEq/L (ref 136–145)
Total Bilirubin: 0.29 mg/dL (ref 0.20–1.20)
Total Protein: 7.3 g/dL (ref 6.4–8.3)

## 2013-03-19 LAB — CBC WITH DIFFERENTIAL/PLATELET
BASO%: 0.3 % (ref 0.0–2.0)
Eosinophils Absolute: 0.2 10*3/uL (ref 0.0–0.5)
HCT: 36 % (ref 34.8–46.6)
LYMPH%: 10.8 % — ABNORMAL LOW (ref 14.0–49.7)
MONO#: 0.9 10*3/uL (ref 0.1–0.9)
NEUT#: 8.4 10*3/uL — ABNORMAL HIGH (ref 1.5–6.5)
NEUT%: 79.1 % — ABNORMAL HIGH (ref 38.4–76.8)
Platelets: 289 10*3/uL (ref 145–400)
RBC: 3.91 10*6/uL (ref 3.70–5.45)
WBC: 10.6 10*3/uL — ABNORMAL HIGH (ref 3.9–10.3)

## 2013-03-19 MED ORDER — PACLITAXEL PROTEIN-BOUND CHEMO INJECTION 100 MG
100.0000 mg/m2 | Freq: Once | INTRAVENOUS | Status: AC
  Administered 2013-03-19: 150 mg via INTRAVENOUS
  Filled 2013-03-19: qty 30

## 2013-03-19 MED ORDER — PROCHLORPERAZINE MALEATE 10 MG PO TABS: 10.0000 mg | ORAL_TABLET | Freq: Four times a day (QID) | ORAL | Status: DC | PRN

## 2013-03-19 MED ORDER — DEXAMETHASONE SODIUM PHOSPHATE 10 MG/ML IJ SOLN
10.0000 mg | Freq: Once | INTRAMUSCULAR | Status: AC
  Administered 2013-03-19: 10 mg via INTRAVENOUS

## 2013-03-19 MED ORDER — LORAZEPAM 2 MG/ML IJ SOLN
INTRAMUSCULAR | Status: AC
  Filled 2013-03-19: qty 1

## 2013-03-19 MED ORDER — SODIUM CHLORIDE 0.9 % IJ SOLN
10.0000 mL | INTRAMUSCULAR | Status: DC | PRN
  Administered 2013-03-19: 10 mL
  Filled 2013-03-19: qty 10

## 2013-03-19 MED ORDER — OMEPRAZOLE 20 MG PO CPDR: 20.0000 mg | DELAYED_RELEASE_CAPSULE | Freq: Two times a day (BID) | ORAL | Status: DC

## 2013-03-19 MED ORDER — SODIUM CHLORIDE 0.9 % IV SOLN
Freq: Once | INTRAVENOUS | Status: AC
  Administered 2013-03-19: 12:00:00 via INTRAVENOUS

## 2013-03-19 MED ORDER — HEPARIN SOD (PORK) LOCK FLUSH 100 UNIT/ML IV SOLN
500.0000 [IU] | Freq: Once | INTRAVENOUS | Status: AC | PRN
  Administered 2013-03-19: 500 [IU]
  Filled 2013-03-19: qty 5

## 2013-03-19 MED ORDER — LORAZEPAM 2 MG/ML IJ SOLN
0.5000 mg | Freq: Once | INTRAMUSCULAR | Status: AC
  Administered 2013-03-19: 0.5 mg via INTRAVENOUS

## 2013-03-19 MED ORDER — DEXAMETHASONE SODIUM PHOSPHATE 10 MG/ML IJ SOLN
INTRAMUSCULAR | Status: AC
  Filled 2013-03-19: qty 1

## 2013-03-19 NOTE — Telephone Encounter (Signed)
Per staff message and POF I have scheduled appts.  JMW  

## 2013-03-19 NOTE — Progress Notes (Signed)
Patient ID: Carrie Berry, female   DOB: 1950/06/03, 62 y.o.   MRN: [id number] ID: Carrie Berry OB: May 22, 1950  MR#: [id number]  CSN#:[id number]  PCP: Katy Apo, MD GYN:   SU: Avel Peace OTHER MD: Lurline Hare, Danise Edge, Rick Cornella, Melodye Ped  CHIEF COMPLAINT:  Metastatic Breast Cancer   HISTORY OF PRESENT ILLNESS: Carrie Berry noted a mass in her right breast December of 2013, but did not think much of it. It did grow some lower the summer, but she was keeping her grandchild at that time and was too busy so she did not bring it to Dr. Lynnette Caffey attention until August. He set her up for bilateral mammography and ultrasonography at [facility] 12/18/2012 and this measured, on the right, a large irregular lobulated mass in the upper outer quadrant which by ultrasound measured 5.7 cm. The right axilla showed some lymph nodes with thickened cortices. In the left breast there was a focal asymmetry at the depth, lateral to the nipple, and by ultrasound there was a 9 mm ill-defined hypoechoic lesion in this location. Biopsy of the left breast lesion showed only fibrocystic changes.  Biopsy of the right breast mass and right axillary adenopathy ([id number]) on 12/23/2012 showed both to be involved by invasive ductal carcinoma, grade 2, triple negative, with an MIB-1 of 86%.  MRI of the breast 12/27/2012 at [facility] imaging showed in the right breast a mass abutting the pectoralis muscle without enhancement of the muscle measuring 4.8 cm. The satellite nodule measuring approximately 3 mm was also noted superior to the mass and several abnormal and enlarged right axillary lymph nodes were noted, one of which appeared to be necrotic. The largest node measured 2.0 cm. Unfortunately, numerous bilateral pulmonary nodules were also noted.  The patient's subsequent history is as detailed below   INTERVAL HISTORY: Carrie Berry returns today accompanied by her husband [name] for followup of her metastatic breast  cancer. She is ready to initiate treatment with Abraxane today, the plan being to treat on days one and 8 of each 21 day cycle. She is due for day 1 cycle 1 today.  Interval history is notable for Carrie Berry having had a new baseline chest CT earlier this week on November 3.  This was compared to studies in August of 2014 and showed interval progression of her metastatic breast cancer. These results are all detailed below, and Carrie Berry and I did review this together today as well.    REVIEW OF SYSTEMS: Carrie Berry has no new complaints today, and overall is feeling well. Her biggest problem is continued problem sleeping. She is using lorazepam occasionally to help her sleep.  We discussed the fact that she should not use this every night.   Carrie Berry has had no recent fevers or chills. She denies any skin changes or rashes and has had no abnormal bruising or bleeding. Her appetite is fair. She's had no problems with nausea or emesis and is having regular bowel movements. She does tell me she has a "sensitive stomach" and is taking omeprazole, 20 mg daily, for reflux. He She has some sinus congestion associated headaches. She is using Nasacort nasal spray. She's had no increased cough, phlegm production, shortness of breath, chest pain, or palpitations. She currently denies any unusual myalgias, arthralgias, or bony pain, and at baseline has no evidence of peripheral neuropathy. She's had no peripheral swelling.  A detailed review of systems is otherwise stable and noncontributory.    PAST MEDICAL HISTORY: Past Medical History  Diagnosis Date  . Recurrent sinus infections   . Arthritis   . PONV (postoperative nausea and vomiting)   . Anxiety   . Cancer     breast    PAST SURGICAL HISTORY: Past Surgical History  Procedure Laterality Date  . Tubal ligation    . Tubal ligation  1985  . Portacath placement N/A 01/10/2013    Procedure: ULTRASOUND GUIDED PORT-A-CATH INSERTION WITH FLUOROSCOPY;  Surgeon: Adolph Pollack, MD;  Location: [facility] OR;  Service: General;  Laterality: N/A;    FAMILY HISTORY Family History  Problem Relation Age of Onset  . Bladder Cancer Father   . Colon cancer Maternal Grandmother    the patient's parents are living, both in their 8s. The patient's father was diagnosed with bladder cancer the age of 49. The patient's mother's mother had some type of gastrointestinal cancer. The patient had one brother, no sisters. There is no history of breast or ovarian cancer in the family other than a cousin on the father's side who was diagnosed with breast cancer apparently before the age of 62.  GYNECOLOGIC HISTORY:  Menarche age 23, first live birth age 69, the patient is GX P2. She went through menopause in 2008. She did not take hormone replacement. She is birth control for approximately 22 years remotely.  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 She's currently on disability. Her husband Carrie Berry [name]"), is vice Conservation officer, nature for [employer]. The patient's daughter Carrie Berry is a stay-at-home mom in [place], the patient's son Carrie Berry unfortunately died in an automobile accident in December of 2011. The patient has 2 grandchildren. She attends to a local [employer].    ADVANCED DIRECTIVES: Not in place   HEALTH MAINTENANCE: (Updated 03/19/2013) History  Substance Use Topics  . Smoking status: Never Smoker   . Smokeless tobacco: Never Used  . Alcohol Use: No     Colonoscopy: 2005  PAP: Not on file  Bone density: Never  Lipid panel:  Dr. Nehemiah Settle   Allergies  Allergen Reactions  . Codeine Nausea And Vomiting    "violently ill"  . Penicillins Rash    Childhood reaction -    Current Outpatient Prescriptions  Medication Sig Dispense Refill  . acetaminophen (TYLENOL) 325 MG tablet Take 650 mg by mouth every 8 (eight) hours as needed  for pain.      Marland Kitchen lidocaine-prilocaine (EMLA) cream Apply topically as needed.  30 g  0  . LORazepam (ATIVAN) 1 MG tablet Take 0.5 mg by mouth every 6 (six) hours as needed for anxiety.       . Multiple Vitamin (MULTIVITAMIN WITH MINERALS) TABS tablet Take 1 tablet by mouth daily.      Marland Kitchen PATADAY 0.2 % SOLN Place 1 drop into both eyes daily as needed (dry eyes).       . prochlorperazine (COMPAZINE) 10 MG tablet Take 1 tablet (10 mg total) by mouth every 6 (six) hours as needed for nausea.  30 tablet  2  . triamcinolone (NASACORT) 55 MCG/ACT nasal inhaler Place 2 sprays into the nose daily.      Marland Kitchen omeprazole (PRILOSEC) 20 MG capsule Take 1 capsule (20 mg total) by mouth 2 (two) times daily.  60 capsule  3  . ondansetron (ZOFRAN) 8 MG tablet Take 8 mg by mouth every 12 (twelve) hours as needed for nausea.       Marland Kitchen  traMADol (ULTRAM) 50 MG tablet Take 50 mg by mouth every 6 (six) hours as needed for pain.        No current facility-administered medications for this visit.   Facility-Administered Medications Ordered in Other Visits  Medication Dose Route Frequency Provider Last Rate Last Dose  . 0.9 %  sodium chloride infusion   Intravenous Once Lowella Dell, MD      . dexamethasone (DECADRON) injection 10 mg  10 mg Intravenous Once Lowella Dell, MD      . heparin lock flush 100 unit/mL  500 Units Intracatheter Once PRN Lowella Dell, MD      . LORazepam (ATIVAN) injection 0.5 mg  0.5 mg Intravenous Once Lowella Dell, MD      . PACLitaxel-protein bound (ABRAXANE) chemo infusion 150 mg  100 mg/m2 (Treatment Plan Actual) Intravenous Once Lowella Dell, MD      . sodium chloride 0.9 % injection 10 mL  10 mL Intracatheter PRN Lowella Dell, MD        OBJECTIVE: Middle-aged white woman who appears stated age and is in no acute distress Filed Vitals:   03/19/13 0914  BP: 116/70  Pulse: 83  Temp: 98.3 F (36.8 C)  Resp: 20     Body mass index is 22.68 kg/(m^2).    ECOG FS:  1 Filed Weights   03/19/13 0914  Weight: 130 lb 1.6 oz (59.013 kg)   Physical Exam: HEENT:  Sclerae anicteric.  Oropharynx clear. No ulcerations or evidence of candidiasis. NODES:  No cervical or supraclavicular lymphadenopathy palpated.  BREAST EXAM:  In the right breast, there is a large palpable mass in the lateral portion of the breast, measuring approximately 4 cm. The skin is mildly pink, but not erythematous. There is a small palpable node in the right axilla, with a left axilla being benign.  LUNGS:  Clear to auscultation bilaterally.  No wheezes or rhonchi.  HEART:  Regular rate and rhythm.  ABDOMEN:  Soft, nontender.  Positive bowel sounds. No hepatomegaly palpated. MSK:  No focal spinal tenderness to palpation. Good range of motion in the upper extremities. EXTREMITIES:  No peripheral edema.   SKIN:  Port is intact in the chest wall, no erythema or edema noted, and no evidence of infection. NEURO:  Nonfocal. Well oriented.  Positive affect.   LAB RESULTS:   Lab Results  Component Value Date   WBC 10.6* 03/19/2013   NEUTROABS 8.4* 03/19/2013   HGB 11.7 03/19/2013   HCT 36.0 03/19/2013   MCV 92.1 03/19/2013   PLT 289 03/19/2013      Chemistry      Component Value Date/Time   NA 138 03/03/2013 1100   NA 136 02/28/2013 2230   K 3.8 03/03/2013 1100   K 3.4* 02/28/2013 2230   CL 99 02/28/2013 2230   CO2 25 03/03/2013 1100   CO2 28 02/28/2013 2230   BUN 11.0 03/03/2013 1100   BUN 8 02/28/2013 2230   CREATININE 0.8 03/03/2013 1100   CREATININE 0.59 02/28/2013 2230      Component Value Date/Time   CALCIUM 9.9 03/03/2013 1100   CALCIUM 9.0 02/28/2013 2230   ALKPHOS 110 03/03/2013 1100   ALKPHOS 102 02/28/2013 2230   AST 19 03/03/2013 1100   AST 19 02/28/2013 2230   ALT 21 03/03/2013 1100   ALT 18 02/28/2013 2230   BILITOT 0.43 03/03/2013 1100   BILITOT 0.3 02/28/2013 2230       STUDIES:  Ct Chest W Contrast  03/17/2013   CLINICAL DATA:  Breast cancer,  chemotherapy and progress. Right chest pain.  EXAM: CT CHEST WITH CONTRAST  TECHNIQUE: Multidetector CT imaging of the chest was performed during intravenous contrast administration.  CONTRAST:  80mL OMNIPAQUE IOHEXOL 300 MG/ML  SOLN  COMPARISON:  PET 01/07/2013 and CT chest 01/07/2013.  FINDINGS: No pathologically enlarged mediastinal, hilar or left axillary lymph nodes. Some right axillary lymph nodes have increased in prominence in the interval. For example, an 11 mm short axis lymph node (image 18) has enlarged from 7 mm. Largest 1.6 cm short axis lymph node is stable. Necrotic appearing right breast mass measures 4.7 x 4.7 cm (previously 3.7 x 4.3 cm). No internal mammary adenopathy. Heart size within normal limits. No pericardial effusion.  Multiple bilateral pulmonary nodules are again seen, some of which are larger. Index nodule along the medial aspect of the right major fissure (image 26) measures 7 x 9 mm (previously 4 x 6 mm). No pleural fluid. Airway is unremarkable.  Incidental imaging of the upper abdomen shows multiple new and enlarging heterogeneous lesions in the liver, measuring up to 1.7 cm in the right hepatic lobe (previously 1.3 cm). Prominent left extrarenal pelvis. No definite worrisome lytic or sclerotic lesions.  IMPRESSION: Interval progression of metastatic breast cancer, as evidenced by an enlarging primary right breast mass, enlarging right axillary lymph nodes, enlarging pulmonary nodules and new/enlarging hepatic metastases. These results were called by telephone at the time of interpretation on 03/17/2013 at 3:39 PM to Dr. Ruthann Cancer , who verbally acknowledged these results.   Electronically Signed   By: Leanna Battles M.D.   On: 03/17/2013 15:44    ASSESSMENT: 62 y.o. Jacquenette Shone, [place] woman status post right breast upper outer quadrant and right axillary lymph node biopsy 12/23/2012 for a clinical T3 N1 M1, stage IV invasive ductal carcinoma, grade 3, triple negative, with an  MIB-1 of 86%.  (1) right liver lobe biopsy 01/21/2013 confirms metastatic adenocarcinoma; scans show involvement of the liver, lungs, and likely bone.   (2) enrolled in [facility] study [id number] (docetaxel + oral gamma secretase inibitor [id number]), received one cycle starting 02/04/2013  but withdrew because of poor tolerance despite treatment interruptions and decreased dosing of the oral component  (3)  chest CT in early November 2014 was compared with studies in August 2014 and showed interval progression of metastatic breast cancer, with an enlarging primary right breast mass, enlarging right axillary lymph nodes, enlarging pulmonary nodules and new/enlarging hepatic metastases.  (4) Abraxane started 03/19/2013, the goal being for treatment day 1 and day 8 of each 21 day cycle, with restaging after 4 cycles.  PLAN: The majority of our 40 minute appointment today was spent reviewing the patient's recent CT, discussing her treatment plan, answering her questions, reviewing her antinausea regimen, and coordinating care.   Avnoor is ready to initiate day 1 cycle 1 of Abraxane today. We did review all possible side effects and toxicities, including the possibility of peripheral neuropathy. She was given written instructions on how to take prochlorperazine for nausea following treatment. Basically she will take prochlorperazine at dinner and bedtime on the day of chemotherapy, then with meals and bedtime the day after treatment. She will then take it up to every 6 hours as needed for nausea.  If she feels like the prochlorperazine is making her "jittery", we will adjust her antinausea regimen accordingly, and can try either promethazine, metoclopramide or dexamethasone in place of the prochlorperazine.  We also discussed increasing her omeprazole for reflux, and she will increase to 20 mg twice daily. I have sent prescriptions for both prochlorperazine and omeprazole to [facility] pharmacy.  I will see Roshini weekly  during this first cycle, her next appointment here being in one week, November 12, in anticipation of day 8 cycle 1 of Abraxane. I will see her again on the 19th for assessment of chemotoxicity, and we will plan on initiating her second cycle of Abraxane on November 25. She has been scheduled through the end of December. Our plan is to restage after 4 cycles, which will be in mid January.  Novelle has a good understanding of the overall plan. She knows the goal of treatment is control. She will call for any problems that may develop before her next visit here.   Symphony Demuro, PA-C   03/19/2013 11:49 AM

## 2013-03-19 NOTE — Patient Instructions (Signed)

## 2013-03-24 ENCOUNTER — Other Ambulatory Visit: Payer: Self-pay | Admitting: *Deleted

## 2013-03-24 ENCOUNTER — Telehealth: Payer: Self-pay | Admitting: *Deleted

## 2013-03-24 MED ORDER — TRAZODONE HCL 50 MG PO TABS: 50.0000 mg | ORAL_TABLET | Freq: Every day | ORAL | Status: DC

## 2013-03-24 NOTE — Telephone Encounter (Signed)
Pt left message with this RN stating concern due to ongoing difficulty sleeping and complaints of feeling fatiqued.  She states she used suggestion by AB/PA last week but is still not sleeping well.  Return call number given as  [phone].

## 2013-03-24 NOTE — Telephone Encounter (Signed)
Per review by AB/PA obtained recommendation for trazadone/\.  Called and discussed with pt and presription sent to pharmacy.

## 2013-03-26 ENCOUNTER — Other Ambulatory Visit: Payer: BC Managed Care – PPO | Admitting: Lab

## 2013-03-26 ENCOUNTER — Ambulatory Visit (HOSPITAL_BASED_OUTPATIENT_CLINIC_OR_DEPARTMENT_OTHER): Payer: BC Managed Care – PPO | Admitting: Physician Assistant

## 2013-03-26 ENCOUNTER — Ambulatory Visit (HOSPITAL_BASED_OUTPATIENT_CLINIC_OR_DEPARTMENT_OTHER): Payer: BC Managed Care – PPO

## 2013-03-26 ENCOUNTER — Encounter: Payer: Self-pay | Admitting: Oncology

## 2013-03-26 ENCOUNTER — Other Ambulatory Visit (HOSPITAL_BASED_OUTPATIENT_CLINIC_OR_DEPARTMENT_OTHER): Payer: BC Managed Care – PPO | Admitting: Lab

## 2013-03-26 ENCOUNTER — Encounter: Payer: Self-pay | Admitting: Physician Assistant

## 2013-03-26 VITALS — BP 128/74 | HR 103 | Temp 98.6°F | Resp 18 | Ht 63.0 in | Wt 128.5 lb

## 2013-03-26 DIAGNOSIS — Z5111 Encounter for antineoplastic chemotherapy: Secondary | ICD-10-CM

## 2013-03-26 DIAGNOSIS — C50419 Malignant neoplasm of upper-outer quadrant of unspecified female breast: Secondary | ICD-10-CM

## 2013-03-26 DIAGNOSIS — C787 Secondary malignant neoplasm of liver and intrahepatic bile duct: Secondary | ICD-10-CM

## 2013-03-26 DIAGNOSIS — C50411 Malignant neoplasm of upper-outer quadrant of right female breast: Secondary | ICD-10-CM

## 2013-03-26 DIAGNOSIS — C773 Secondary and unspecified malignant neoplasm of axilla and upper limb lymph nodes: Secondary | ICD-10-CM

## 2013-03-26 DIAGNOSIS — C7800 Secondary malignant neoplasm of unspecified lung: Secondary | ICD-10-CM

## 2013-03-26 DIAGNOSIS — C50911 Malignant neoplasm of unspecified site of right female breast: Secondary | ICD-10-CM

## 2013-03-26 DIAGNOSIS — G4700 Insomnia, unspecified: Secondary | ICD-10-CM

## 2013-03-26 LAB — CBC WITH DIFFERENTIAL/PLATELET
BASO%: 0.6 % (ref 0.0–2.0)
Eosinophils Absolute: 0.5 10*3/uL (ref 0.0–0.5)
HCT: 38.1 % (ref 34.8–46.6)
HGB: 12.5 g/dL (ref 11.6–15.9)
MCHC: 32.8 g/dL (ref 31.5–36.0)
MONO#: 0.4 10*3/uL (ref 0.1–0.9)
NEUT%: 75.6 % (ref 38.4–76.8)
RBC: 4.22 10*6/uL (ref 3.70–5.45)
RDW: 13.2 % (ref 11.2–14.5)
WBC: 9.9 10*3/uL (ref 3.9–10.3)
lymph#: 1.5 10*3/uL (ref 0.9–3.3)
nRBC: 0 % (ref 0–0)

## 2013-03-26 MED ORDER — HEPARIN SOD (PORK) LOCK FLUSH 100 UNIT/ML IV SOLN
500.0000 [IU] | Freq: Once | INTRAVENOUS | Status: AC | PRN
  Administered 2013-03-26: 500 [IU]
  Filled 2013-03-26: qty 5

## 2013-03-26 MED ORDER — SODIUM CHLORIDE 0.9 % IJ SOLN
10.0000 mL | INTRAMUSCULAR | Status: DC | PRN
  Administered 2013-03-26: 10 mL
  Filled 2013-03-26: qty 10

## 2013-03-26 MED ORDER — SODIUM CHLORIDE 0.9 % IV SOLN
Freq: Once | INTRAVENOUS | Status: AC
  Administered 2013-03-26: 10:00:00 via INTRAVENOUS

## 2013-03-26 MED ORDER — PACLITAXEL PROTEIN-BOUND CHEMO INJECTION 100 MG
100.0000 mg/m2 | Freq: Once | INTRAVENOUS | Status: AC
  Administered 2013-03-26: 150 mg via INTRAVENOUS
  Filled 2013-03-26: qty 30

## 2013-03-26 MED ORDER — DEXAMETHASONE SODIUM PHOSPHATE 10 MG/ML IJ SOLN
10.0000 mg | Freq: Once | INTRAMUSCULAR | Status: AC
  Administered 2013-03-26: 10 mg via INTRAVENOUS

## 2013-03-26 MED ORDER — PROMETHAZINE HCL 25 MG PO TABS: 25.0000 mg | ORAL_TABLET | Freq: Four times a day (QID) | ORAL | Status: DC | PRN

## 2013-03-26 MED ORDER — LORAZEPAM 2 MG/ML IJ SOLN
INTRAMUSCULAR | Status: AC
  Filled 2013-03-26: qty 1

## 2013-03-26 MED ORDER — DEXAMETHASONE SODIUM PHOSPHATE 10 MG/ML IJ SOLN
INTRAMUSCULAR | Status: AC
  Filled 2013-03-26: qty 1

## 2013-03-26 MED ORDER — LORAZEPAM 2 MG/ML IJ SOLN
0.5000 mg | Freq: Once | INTRAMUSCULAR | Status: AC
  Administered 2013-03-26: 0.5 mg via INTRAVENOUS

## 2013-03-26 NOTE — Patient Instructions (Signed)
[facility] Discharge Instructions for Patients Receiving Chemotherapy  Today you received the following chemotherapy agent: Abraxane   To help prevent nausea and vomiting after your treatment, we encourage you to take your nausea medication as prescribed. You received Ativan and Decadron through your IV today in the infusion room.    If you develop nausea and vomiting that is not controlled by your nausea medication, call the clinic.   BELOW ARE SYMPTOMS THAT SHOULD BE REPORTED IMMEDIATELY:  *FEVER GREATER THAN 100.5 F  *CHILLS WITH OR WITHOUT FEVER  NAUSEA AND VOMITING THAT IS NOT CONTROLLED WITH YOUR NAUSEA MEDICATION  *UNUSUAL SHORTNESS OF BREATH  *UNUSUAL BRUISING OR BLEEDING  TENDERNESS IN MOUTH AND THROAT WITH OR WITHOUT PRESENCE OF ULCERS  *URINARY PROBLEMS  *BOWEL PROBLEMS  UNUSUAL RASH Items with * indicate a potential emergency and should be followed up as soon as possible.  Feel free to call the clinic you have any questions or concerns. The clinic phone number is [phone].

## 2013-03-26 NOTE — Progress Notes (Signed)
Patient ID: Carrie Berry, female   DOB: 28-Oct-1950, 62 y.o.   MRN: [id number] ID: Carrie Berry OB: 1951-05-14  MR#: [id number]  CSN#:[id number]  PCP: Katy Apo, MD GYN:   SU: Avel Peace OTHER MD: Lurline Hare, Danise Edge, Rick Cornella, Melodye Ped  CHIEF COMPLAINT:  Metastatic Breast Cancer   HISTORY OF PRESENT ILLNESS: Carrie Berry noted a mass in her right breast December of 2013, but did not think much of it. It did grow some lower the summer, but she was keeping her grandchild at that time and was too busy so she did not bring it to Dr. Lynnette Caffey attention until August. He set her up for bilateral mammography and ultrasonography at [facility] 12/18/2012 and this measured, on the right, a large irregular lobulated mass in the upper outer quadrant which by ultrasound measured 5.7 cm. The right axilla showed some lymph nodes with thickened cortices. In the left breast there was a focal asymmetry at the depth, lateral to the nipple, and by ultrasound there was a 9 mm ill-defined hypoechoic lesion in this location. Biopsy of the left breast lesion showed only fibrocystic changes.  Biopsy of the right breast mass and right axillary adenopathy ([id number]) on 12/23/2012 showed both to be involved by invasive ductal carcinoma, grade 2, triple negative, with an MIB-1 of 86%.  MRI of the breast 12/27/2012 at [facility] imaging showed in the right breast a mass abutting the pectoralis muscle without enhancement of the muscle measuring 4.8 cm. The satellite nodule measuring approximately 3 mm was also noted superior to the mass and several abnormal and enlarged right axillary lymph nodes were noted, one of which appeared to be necrotic. The largest node measured 2.0 cm. Unfortunately, numerous bilateral pulmonary nodules were also noted.  The patient's subsequent history is as detailed below   INTERVAL HISTORY: Carrie Berry returns alone today for followup of her metastatic breast cancer. She is currently  being treated with Abraxane, with treatments on days one and 8 of each 21 day cycle. She is due for day 8 cycle 1 today.  Overall, Carrie Berry feels that she tolerated treatment well last week. She had no problems with nausea or emesis. She's had no signs of peripheral neuropathy. The pain in the right breast has improved. She has been a little more active, and is actually walking approximately one hour every day.  Her biggest complaint today is continued insomnia which has been a chronic problem, but is made worse during chemotherapy, likely associated with steroids and the antinausea medication. She took prochlorperazine with good tolerance, only feeling "jittery" on the third day, at which time the antinausea medication was discontinued. She takes an occasional dose of Ambien at night, but sometimes it causes a headache. We also recently prescribed trazodone, 50 mg at night for sleep. I will mention that she has tried Benadryl with no relief, and also finds no relief with lorazepam.   ready to initiate treatment with Abraxane today, the plan being to treat on days one and 8 of each 21 day cycle. She is due for day 1 cycle 1 today.  REVIEW OF SYSTEMS: Burnie has had no fevers, chills, night sweats, or hot flashes. Her appetite is good and she denies any nausea or change in bowel habits. She's had no new cough, shortness of breath, chest pain, or palpitations. She's been taking Claritin-D which has helped her sinus congestion and she is having fewer headaches. She's had no dizziness or change in vision. She currently denies any  unusual myalgias, arthralgias, or bony pain, and has had no peripheral swelling.  A detailed review of systems is otherwise stable and noncontributory.   PAST MEDICAL HISTORY: Past Medical History  Diagnosis Date  . Recurrent sinus infections   . Arthritis   . PONV (postoperative nausea and vomiting)   . Anxiety   . Cancer     breast    PAST SURGICAL HISTORY: Past Surgical  History  Procedure Laterality Date  . Tubal ligation    . Tubal ligation  1985  . Portacath placement N/A 01/10/2013    Procedure: ULTRASOUND GUIDED PORT-A-CATH INSERTION WITH FLUOROSCOPY;  Surgeon: Adolph Pollack, MD;  Location: [facility] OR;  Service: General;  Laterality: N/A;    FAMILY HISTORY Family History  Problem Relation Age of Onset  . Bladder Cancer Father   . Colon cancer Maternal Grandmother    the patient's parents are living, both in their 61s. The patient's father was diagnosed with bladder cancer the age of 53. The patient's mother's mother had some type of gastrointestinal cancer. The patient had one brother, no sisters. There is no history of breast or ovarian cancer in the family other than a cousin on the father's side who was diagnosed with breast cancer apparently before the age of 67.  GYNECOLOGIC HISTORY:  Menarche age 44, first live birth age 66, the patient is GX P2. She went through menopause in 2008. She did not take hormone replacement. She is birth control for approximately 22 years remotely.  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 She's currently on disability. Her husband Carrie Berry November"), is vice Conservation officer, nature for [employer]. The patient's daughter Carrie Berry is a stay-at-home mom in [place], the patient's son Carrie Berry unfortunately died in an automobile accident in December of 2011. The patient has 2 grandchildren. She attends to a local [employer].    ADVANCED DIRECTIVES: Not in place   HEALTH MAINTENANCE: (Updated 03/19/2013) History  Substance Use Topics  . Smoking status: Never Smoker   . Smokeless tobacco: Never Used  . Alcohol Use: No     Colonoscopy: 2005  PAP: Not on file  Bone density: Never  Lipid panel:  Dr. Nehemiah Berry   Allergies  Allergen Reactions  . Ondansetron Hcl Other (See Comments)    HEADACHE   . Codeine Nausea And Vomiting    "violently ill"  . Penicillins Rash    Childhood reaction -    Current Outpatient Prescriptions  Medication Sig Dispense Refill  . acetaminophen (TYLENOL) 325 MG tablet Take 650 mg by mouth every 8 (eight) hours as needed for pain.      Marland Kitchen lidocaine-prilocaine (EMLA) cream Apply topically as needed.  30 g  0  . LORazepam (ATIVAN) 1 MG tablet Take 0.5 mg by mouth every 6 (six) hours as needed for anxiety.       . Multiple Vitamin (MULTIVITAMIN WITH MINERALS) TABS tablet Take 1 tablet by mouth daily.      Marland Kitchen omeprazole (PRILOSEC) 20 MG capsule Take 1 capsule (20 mg total) by mouth 2 (two) times daily.  60 capsule  3  . PATADAY 0.2 % SOLN Place 1 drop into both eyes daily as needed (dry eyes).       . prochlorperazine (COMPAZINE) 10 MG tablet Take 1 tablet (10 mg total) by mouth every 6 (six) hours as needed for nausea.  30 tablet  2  .  traMADol (ULTRAM) 50 MG tablet Take 50 mg by mouth every 6 (six) hours as needed for pain.       . traZODone (DESYREL) 50 MG tablet Take 1 tablet (50 mg total) by mouth at bedtime.  30 tablet  0  . triamcinolone (NASACORT) 55 MCG/ACT nasal inhaler Place 2 sprays into the nose daily.      Marland Kitchen zolpidem (AMBIEN) 5 MG tablet       . promethazine (PHENERGAN) 25 MG tablet Take 1 tablet (25 mg total) by mouth every 6 (six) hours as needed for nausea or vomiting.  30 tablet  1   No current facility-administered medications for this visit.    OBJECTIVE: Middle-aged white woman who appears stated age and is in no acute distress Filed Vitals:   03/26/13 0904  BP: 128/74  Pulse: 103  Temp: 98.6 F (37 C)  Resp: 18     Body mass index is 22.77 kg/(m^2).    ECOG FS: 1 Filed Weights   03/26/13 0904  Weight: 128 lb 8 oz (58.287 kg)   Physical Exam: HEENT:  Sclerae anicteric.  Oropharynx clear with no ulcerations or evidence of candidiasis. NODES:  No cervical or supraclavicular lymphadenopathy palpated.  BREAST EXAM:  In the right  breast, there is still a large palpable mass in the lateral portion of the breast, measuring approximately 4 cm. The mass seems slightly "flatter" and less firm than previously examined. The skin is mildly pink, but not erythematous. There is a small palpable node in the right axilla, with a left axilla being benign.  LUNGS:  Clear to auscultation bilaterally.  No wheezes or rhonchi. Good excursion bilaterally. HEART:  Regular rate and rhythm. No murmur appreciated. ABDOMEN:  Soft, nontender.  Positive bowel sounds. No hepatomegaly palpated. MSK:  No focal spinal tenderness to palpation. Good range of motion in the upper extremities. EXTREMITIES:  No peripheral edema.   SKIN:  Port is intact in the chest wall, no erythema or edema noted, and no evidence of infection. NEURO:  Nonfocal. Well oriented.  Positive affect.   LAB RESULTS:   Lab Results  Component Value Date   WBC 9.9 03/26/2013   NEUTROABS 7.5* 03/26/2013   HGB 12.5 03/26/2013   HCT 38.1 03/26/2013   MCV 90.3 03/26/2013   PLT 305 03/26/2013      Chemistry      Component Value Date/Time   NA 140 03/19/2013 0846   NA 136 02/28/2013 2230   K 4.2 03/19/2013 0846   K 3.4* 02/28/2013 2230   CL 99 02/28/2013 2230   CO2 25 03/19/2013 0846   CO2 28 02/28/2013 2230   BUN 11.3 03/19/2013 0846   BUN 8 02/28/2013 2230   CREATININE 0.7 03/19/2013 0846   CREATININE 0.59 02/28/2013 2230      Component Value Date/Time   CALCIUM 9.8 03/19/2013 0846   CALCIUM 9.0 02/28/2013 2230   ALKPHOS 96 03/19/2013 0846   ALKPHOS 102 02/28/2013 2230   AST 19 03/19/2013 0846   AST 19 02/28/2013 2230   ALT 13 03/19/2013 0846   ALT 18 02/28/2013 2230   BILITOT 0.29 03/19/2013 0846   BILITOT 0.3 02/28/2013 2230       STUDIES:  Ct Chest W Contrast  03/17/2013   CLINICAL DATA:  Breast cancer, chemotherapy and progress. Right chest pain.  EXAM: CT CHEST WITH CONTRAST  TECHNIQUE: Multidetector CT imaging of the chest was performed during intravenous  contrast administration.  CONTRAST:  80mL OMNIPAQUE IOHEXOL  300 MG/ML  SOLN  COMPARISON:  PET 01/07/2013 and CT chest 01/07/2013.  FINDINGS: No pathologically enlarged mediastinal, hilar or left axillary lymph nodes. Some right axillary lymph nodes have increased in prominence in the interval. For example, an 11 mm short axis lymph node (image 18) has enlarged from 7 mm. Largest 1.6 cm short axis lymph node is stable. Necrotic appearing right breast mass measures 4.7 x 4.7 cm (previously 3.7 x 4.3 cm). No internal mammary adenopathy. Heart size within normal limits. No pericardial effusion.  Multiple bilateral pulmonary nodules are again seen, some of which are larger. Index nodule along the medial aspect of the right major fissure (image 26) measures 7 x 9 mm (previously 4 x 6 mm). No pleural fluid. Airway is unremarkable.  Incidental imaging of the upper abdomen shows multiple new and enlarging heterogeneous lesions in the liver, measuring up to 1.7 cm in the right hepatic lobe (previously 1.3 cm). Prominent left extrarenal pelvis. No definite worrisome lytic or sclerotic lesions.  IMPRESSION: Interval progression of metastatic breast cancer, as evidenced by an enlarging primary right breast mass, enlarging right axillary lymph nodes, enlarging pulmonary nodules and new/enlarging hepatic metastases. These results were called by telephone at the time of interpretation on 03/17/2013 at 3:39 PM to Dr. Ruthann Cancer , who verbally acknowledged these results.   Electronically Signed   By: Leanna Battles M.D.   On: 03/17/2013 15:44    ASSESSMENT: 62 y.o. Jacquenette Shone, [place] woman status post right breast upper outer quadrant and right axillary lymph node biopsy 12/23/2012 for a clinical T3 N1 M1, stage IV invasive ductal carcinoma, grade 3, triple negative, with an MIB-1 of 86%.  (1) right liver lobe biopsy 01/21/2013 confirms metastatic adenocarcinoma; scans show involvement of the liver, lungs, and likely bone.   (2)  enrolled in [facility] study [id number] (docetaxel + oral gamma secretase inibitor [id number]), received one cycle starting 02/04/2013  but withdrew because of poor tolerance despite treatment interruptions and decreased dosing of the oral component  (3)  chest CT in early November 2014 was compared with studies in August 2014 and showed interval progression of metastatic breast cancer, with an enlarging primary right breast mass, enlarging right axillary lymph nodes, enlarging pulmonary nodules and new/enlarging hepatic metastases.  (4) Abraxane started 03/19/2013, the goal being for treatment day 1 and day 8 of each 21 day cycle, with restaging after 4 cycles.  PLAN: [place] will proceed to treatment today as scheduled for day 8 cycle 1 of Abraxane. I will see her again next week on November 19 for assessment of chemotoxicity, and we will plan on beginning cycle 2 on November 25.   In the meanwhile, we spent time today once again reviewing case antinausea regimen. I suggested 2 optio for Jae Dire to consider. She feels like the prochlorperazine works very well with regards to preventing nausea, but did eventually make her feel "jittery". We discussed trying half a tablet (5 mg instead of 10 mg) for taking the medication on the same schedule. I am also prescribing promethazine 25 mg. I explained that she could alternate these medications, and take either the prochlorperazine or the promethazine up to every 6 hours, which ever she finds works the best and is the best tolerated. Also suggested that she try taking the promethazine at bedtime to see if it helps her sleep. Recall that ondansetron causes severe headaches. With her chronic issues with insomnia and "jitteriness", I am somewhat hesitant to start her on dexamethasone for nausea prevention  at this point.  With regards to the insomnia, she can occasionally take a dose of Ambien, but will continue on the trazodone. She's currently taking 50 mg and we discussed the  possibility of increasing to 100 mg to see if that helps her sleep through the night. We will reassess when I see her again next week.   Our plan is to restage after 4 cycles (8 doses) of Abraxane, which will be in mid January.  Anyely has a good understanding of the overall plan. She knows the goal of treatment is control. She also knows to call for any problems that may develop before her next visit here.   Dianelys Scinto, PA-C   03/26/2013 10:16 AM

## 2013-04-02 ENCOUNTER — Other Ambulatory Visit (HOSPITAL_BASED_OUTPATIENT_CLINIC_OR_DEPARTMENT_OTHER): Payer: BC Managed Care – PPO | Admitting: Lab

## 2013-04-02 ENCOUNTER — Ambulatory Visit (HOSPITAL_BASED_OUTPATIENT_CLINIC_OR_DEPARTMENT_OTHER): Payer: BC Managed Care – PPO | Admitting: Physician Assistant

## 2013-04-02 ENCOUNTER — Telehealth: Payer: Self-pay | Admitting: Oncology

## 2013-04-02 ENCOUNTER — Telehealth: Payer: Self-pay | Admitting: *Deleted

## 2013-04-02 ENCOUNTER — Encounter: Payer: Self-pay | Admitting: Physician Assistant

## 2013-04-02 VITALS — BP 117/69 | HR 101 | Temp 98.5°F | Resp 18 | Ht 63.0 in | Wt 133.6 lb

## 2013-04-02 DIAGNOSIS — C50911 Malignant neoplasm of unspecified site of right female breast: Secondary | ICD-10-CM

## 2013-04-02 DIAGNOSIS — C787 Secondary malignant neoplasm of liver and intrahepatic bile duct: Secondary | ICD-10-CM

## 2013-04-02 DIAGNOSIS — C50419 Malignant neoplasm of upper-outer quadrant of unspecified female breast: Secondary | ICD-10-CM

## 2013-04-02 DIAGNOSIS — C50411 Malignant neoplasm of upper-outer quadrant of right female breast: Secondary | ICD-10-CM

## 2013-04-02 DIAGNOSIS — G4700 Insomnia, unspecified: Secondary | ICD-10-CM

## 2013-04-02 DIAGNOSIS — C7800 Secondary malignant neoplasm of unspecified lung: Secondary | ICD-10-CM

## 2013-04-02 DIAGNOSIS — C773 Secondary and unspecified malignant neoplasm of axilla and upper limb lymph nodes: Secondary | ICD-10-CM

## 2013-04-02 LAB — CBC WITH DIFFERENTIAL/PLATELET
Basophils Absolute: 0.1 10*3/uL (ref 0.0–0.1)
Eosinophils Absolute: 0.3 10*3/uL (ref 0.0–0.5)
HGB: 11.1 g/dL — ABNORMAL LOW (ref 11.6–15.9)
LYMPH%: 19.9 % (ref 14.0–49.7)
MONO#: 0.2 10*3/uL (ref 0.1–0.9)
NEUT#: 4.2 10*3/uL (ref 1.5–6.5)
RBC: 3.68 10*6/uL — ABNORMAL LOW (ref 3.70–5.45)
RDW: 13.7 % (ref 11.2–14.5)
WBC: 6 10*3/uL (ref 3.9–10.3)
lymph#: 1.2 10*3/uL (ref 0.9–3.3)

## 2013-04-02 LAB — COMPREHENSIVE METABOLIC PANEL (CC13)
ALT: 19 U/L (ref 0–55)
Albumin: 3.8 g/dL (ref 3.5–5.0)
Alkaline Phosphatase: 95 U/L (ref 40–150)
BUN: 14.2 mg/dL (ref 7.0–26.0)
Calcium: 9.6 mg/dL (ref 8.4–10.4)
Chloride: 105 mEq/L (ref 98–109)
Glucose: 97 mg/dl (ref 70–140)
Potassium: 4.2 mEq/L (ref 3.5–5.1)
Sodium: 140 mEq/L (ref 136–145)
Total Bilirubin: 0.25 mg/dL (ref 0.20–1.20)
Total Protein: 7.1 g/dL (ref 6.4–8.3)

## 2013-04-02 MED ORDER — TRAZODONE HCL 50 MG PO TABS: 50.0000 mg | ORAL_TABLET | Freq: Every day | ORAL | Status: DC

## 2013-04-02 MED ORDER — LIDOCAINE-PRILOCAINE 2.5-2.5 % EX CREA: TOPICAL_CREAM | CUTANEOUS | Status: DC | PRN

## 2013-04-02 NOTE — Progress Notes (Signed)
Patient ID: ERENDIDA Berry, female   DOB: December 20, 1950, 62 y.o.   MRN: [id number] ID: Carrie Berry OB: 1951/03/06  MR#: [id number]  ZHY#:[id number]  PCP: Carrie Apo, MD GYN:   SU: Carrie Berry OTHER MD: Carrie Berry, Carrie Berry, Carrie Berry, Carrie Berry  CHIEF COMPLAINT:  Metastatic Breast Cancer   HISTORY OF PRESENT ILLNESS: Carrie Berry noted a mass in her right breast December of 2013, but did not think much of it. It did grow some lower the summer, but she was keeping her grandchild at that time and was too busy so she did not bring it to Carrie Berry attention until August. He set her up for bilateral mammography and ultrasonography at [facility] 12/18/2012 and this measured, on the right, a large irregular lobulated mass in the upper outer quadrant which by ultrasound measured 5.7 cm. The right axilla showed some lymph nodes with thickened cortices. In the left breast there was a focal asymmetry at the depth, lateral to the nipple, and by ultrasound there was a 9 mm ill-defined hypoechoic lesion in this location. Biopsy of the left breast lesion showed only fibrocystic changes.  Biopsy of the right breast mass and right axillary adenopathy ([id number]) on 12/23/2012 showed both to be involved by invasive ductal carcinoma, grade 2, triple negative, with an MIB-1 of 86%.  MRI of the breast 12/27/2012 at [facility] imaging showed in the right breast a mass abutting the pectoralis muscle without enhancement of the muscle measuring 4.8 cm. The satellite nodule measuring approximately 3 mm was also noted superior to the mass and several abnormal and enlarged right axillary lymph nodes were noted, one of which appeared to be necrotic. The largest node measured 2.0 cm. Unfortunately, numerous bilateral pulmonary nodules were also noted.  The patient's subsequent history is as detailed below   INTERVAL HISTORY: Carrie Berry returns alone today for followup of her metastatic breast cancer. She is currently  being treated with Abraxane, with treatments on days one and 8 of each 21 day cycle. She is day 15 cycle 1 today, and overall feels that she has tolerated this first cycle well.  Carrie Berry's biggest complaint continues to be fatigue associated with the insomnia. This has been a chronic problem but recently worsened. She has a prescription for Ambien which she uses occasionally, but she does not take it every day. She has been taking trazodone 50-100 mg at night which does seem to be somewhat helpful, and she requests a refill today. We also decreased her dose of prochlorperazine from 10 mg to 5 mg with this last dose of chemotherapy. This definitely decreased her feeling of being "jittery".  She took promethazine in its place at night which made her a little sleepier.    ready to initiate treatment with Abraxane today, the plan being to treat on days one and 8 of each 21 day cycle. She is due for day 1 cycle 1 today.  REVIEW OF SYSTEMS: Carrie Berry has had no fevers, chills, night sweats, or hot flashes. Her appetite is good and she denies any nausea or change in bowel habits. She's had no new cough, shortness of breath, chest pain, or palpitations. She's been taking Claritin-D  for sinus congestion which has been helpful. She's had fewer headaches. She denies any dizziness or change in vision. She  still has some pain in the lateral portion of the right breast, but feels like this is gradually improving. Otherwise, she currently denies any unusual myalgias, arthralgias, or bony pain, and has  had no peripheral swelling.  she denies any signs of peripheral neuropathy in either the upper or lower extremities.  A detailed review of systems is otherwise stable and noncontributory.   PAST MEDICAL HISTORY: Past Medical History  Diagnosis Date  . Recurrent sinus infections   . Arthritis   . PONV (postoperative nausea and vomiting)   . Anxiety   . Cancer     breast  . Breast cancer   . GERD (gastroesophageal reflux  disease)     PAST SURGICAL HISTORY: Past Surgical History  Procedure Laterality Date  . Tubal ligation    . Tubal ligation  1985  . Portacath placement N/A 01/10/2013    Procedure: ULTRASOUND GUIDED PORT-A-CATH INSERTION WITH FLUOROSCOPY;  Surgeon: Carrie Pollack, MD;  Location: [facility] OR;  Service: General;  Laterality: N/A;    FAMILY HISTORY Family History  Problem Relation Age of Onset  . Bladder Cancer Father   . Colon cancer Maternal Grandmother    the patient's parents are living, both in their 18s. The patient's father was diagnosed with bladder cancer the age of 64. The patient's mother's mother had some type of gastrointestinal cancer. The patient had one brother, no sisters. There is no history of breast or ovarian cancer in the family other than a cousin on the father's side who was diagnosed with breast cancer apparently before the age of 79.  GYNECOLOGIC HISTORY:  Menarche age 41, first live birth age 27, the patient is GX P2. She went through menopause in 2008. She did not take hormone replacement. She is birth control for approximately 22 years remotely.  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 She's currently on disability. Her husband Carrie Berry"), is vice Conservation officer, nature for [employer]. The patient's daughter Carrie Berry is a stay-at-home mom in [place], the patient's son Carrie Berry unfortunately died in an automobile accident in December of 2011. The patient has 2 grandchildren. She attends to a local [employer].    ADVANCED DIRECTIVES: Not in place   HEALTH MAINTENANCE: (Updated 03/19/2013) History  Substance Use Topics  . Smoking status: Never Smoker   . Smokeless tobacco: Never Used  . Alcohol Use: No     Colonoscopy: 2005  PAP: Not on file  Bone density: Never  Lipid panel:  Dr. Nehemiah Settle   Allergies  Allergen Reactions   . Ondansetron Hcl Other (See Comments)    HEADACHE  . Codeine Nausea And Vomiting    "violently ill"  . Penicillins Rash    Childhood reaction -    Current Outpatient Prescriptions  Medication Sig Dispense Refill  . acetaminophen (TYLENOL) 325 MG tablet Take 650 mg by mouth every 8 (eight) hours as needed for pain.      Marland Kitchen lidocaine-prilocaine (EMLA) cream Apply topically as needed.  30 g  3  . LORazepam (ATIVAN) 1 MG tablet Take 0.5 mg by mouth every 6 (six) hours as needed for anxiety.       . Multiple Vitamin (MULTIVITAMIN WITH MINERALS) TABS tablet Take 1 tablet by mouth daily.      Marland Kitchen omeprazole (PRILOSEC) 20 MG capsule Take 1 capsule (20 mg total) by mouth 2 (two) times daily.  60 capsule  3  . prochlorperazine (COMPAZINE) 10 MG tablet Take 1 tablet (10 mg total) by mouth every 6 (six) hours as needed for nausea.  30 tablet  2  . promethazine (PHENERGAN) 25  MG tablet Take 1 tablet (25 mg total) by mouth every 6 (six) hours as needed for nausea or vomiting.  30 tablet  1  . traMADol (ULTRAM) 50 MG tablet Take 50 mg by mouth every 6 (six) hours as needed for pain.       . traZODone (DESYREL) 50 MG tablet Take 1-2 tablets (50-100 mg total) by mouth at bedtime. For sleep  60 tablet  0  . triamcinolone (NASACORT) 55 MCG/ACT nasal inhaler Place 2 sprays into the nose daily.      Marland Kitchen zolpidem (AMBIEN) 5 MG tablet       . PATADAY 0.2 % SOLN Place 1 drop into both eyes daily as needed (dry eyes).        No current facility-administered medications for this visit.    OBJECTIVE: Middle-aged white woman who appears stated age and is in no acute distress Filed Vitals:   04/02/13 1037  BP: 117/69  Pulse: 101  Temp: 98.5 F (36.9 C)  Resp: 18     Body mass index is 23.67 kg/(m^2).    ECOG FS: 1 Filed Weights   04/02/13 1037  Weight: 133 lb 9.6 oz (60.601 kg)   Physical Exam: HEENT:  Sclerae anicteric.  Oropharynx clear with no ulcerations or evidence of candidiasis. mucosa is pink and  moist.  NODES:  No cervical or supraclavicular lymphadenopathy palpated.  BREAST EXAM:  In the right breast, there is still a large palpable mass in the lateral portion of the breast,  although it is less firm and more movable today on exam.  There is a small palpable node in the right axilla, with a left axilla being benign.  LUNGS:  Clear to auscultation bilaterally.  No wheezes or rhonchi. Good excursion bilaterally. HEART:  Regular rate and rhythm. ABDOMEN:  Soft, nontender.  Positive bowel sounds. No hepatomegaly palpated. MSK:  No focal spinal tenderness to palpation. Good range of motion in the upper extremities. EXTREMITIES:  No peripheral edema.   SKIN:  Port is intact in the chest wall, no erythema or edema noted, and no evidence of infection. NEURO:  Nonfocal. Well oriented.  Positive affect.   LAB RESULTS:   Lab Results  Component Value Date   WBC 6.0 04/02/2013   NEUTROABS 4.2 04/02/2013   HGB 11.1* 04/02/2013   HCT 33.3* 04/02/2013   MCV 90.6 04/02/2013   PLT 322 04/02/2013      Chemistry      Component Value Date/Time   NA 140 04/02/2013 1022   NA 136 02/28/2013 2230   K 4.2 04/02/2013 1022   K 3.4* 02/28/2013 2230   CL 99 02/28/2013 2230   CO2 26 04/02/2013 1022   CO2 28 02/28/2013 2230   BUN 14.2 04/02/2013 1022   BUN 8 02/28/2013 2230   CREATININE 0.8 04/02/2013 1022   CREATININE 0.59 02/28/2013 2230      Component Value Date/Time   CALCIUM 9.6 04/02/2013 1022   CALCIUM 9.0 02/28/2013 2230   ALKPHOS 95 04/02/2013 1022   ALKPHOS 102 02/28/2013 2230   AST 23 04/02/2013 1022   AST 19 02/28/2013 2230   ALT 19 04/02/2013 1022   ALT 18 02/28/2013 2230   BILITOT 0.25 04/02/2013 1022   BILITOT 0.3 02/28/2013 2230       STUDIES:  Ct Chest W Contrast  03/17/2013   CLINICAL DATA:  Breast cancer, chemotherapy and progress. Right chest pain.  EXAM: CT CHEST WITH CONTRAST  TECHNIQUE: Multidetector CT imaging of the  chest was performed during intravenous  contrast administration.  CONTRAST:  80mL OMNIPAQUE IOHEXOL 300 MG/ML  SOLN  COMPARISON:  PET 01/07/2013 and CT chest 01/07/2013.  FINDINGS: No pathologically enlarged mediastinal, hilar or left axillary lymph nodes. Some right axillary lymph nodes have increased in prominence in the interval. For example, an 11 mm short axis lymph node (image 18) has enlarged from 7 mm. Largest 1.6 cm short axis lymph node is stable. Necrotic appearing right breast mass measures 4.7 x 4.7 cm (previously 3.7 x 4.3 cm). No internal mammary adenopathy. Heart size within normal limits. No pericardial effusion.  Multiple bilateral pulmonary nodules are again seen, some of which are larger. Index nodule along the medial aspect of the right major fissure (image 26) measures 7 x 9 mm (previously 4 x 6 mm). No pleural fluid. Airway is unremarkable.  Incidental imaging of the upper abdomen shows multiple new and enlarging heterogeneous lesions in the liver, measuring up to 1.7 cm in the right hepatic lobe (previously 1.3 cm). Prominent left extrarenal pelvis. No definite worrisome lytic or sclerotic lesions.  IMPRESSION: Interval progression of metastatic breast cancer, as evidenced by an enlarging primary right breast mass, enlarging right axillary lymph nodes, enlarging pulmonary nodules and new/enlarging hepatic metastases. These results were called by telephone at the time of interpretation on 03/17/2013 at 3:39 PM to Dr. Ruthann Cancer , who verbally acknowledged these results.   Electronically Signed   By: Leanna Battles M.D.   On: 03/17/2013 15:44    ASSESSMENT: 62 y.o. Carrie Berry, [place] woman status post right breast upper outer quadrant and right axillary lymph node biopsy 12/23/2012 for a clinical T3 N1 M1, stage IV invasive ductal carcinoma, grade 3, triple negative, with an MIB-1 of 86%.  (1) right liver lobe biopsy 01/21/2013 confirms metastatic adenocarcinoma; scans show involvement of the liver, lungs, and likely bone.   (2)  enrolled in [facility] study [id number] (docetaxel + oral gamma secretase inibitor [id number]), received one cycle starting 02/04/2013  but withdrew because of poor tolerance despite treatment interruptions and decreased dosing of the oral component  (3)  chest CT in early Berry 2014 was compared with studies in August 2014 and showed interval progression of metastatic breast cancer, with an enlarging primary right breast mass, enlarging right axillary lymph nodes, enlarging pulmonary nodules and new/enlarging hepatic metastases.  (4) Abraxane started 03/19/2013, the goal being for treatment day 1 and day 8 of each 21 day cycle, with restaging after 4 cycles.  PLAN: Carrie Berry  will return next week on Berry 25 in anticipation of day 1 cycle 2 of her Abraxane. We are making no changes in her current regimen, and with cycle 2 she will continue to use prochlorperazine 5 mg, and can alternate it with promethazine as necessary. I have refilled her trazodone with instructions to take 50-100 mg at night for insomnia. I also advised her to take a Claritin-D only in the morning, since the decongestant could also add to her insomnia. I've refilled her Emla cream per her request.  Our plan is to restage after 4 cycles (8 doses) of Abraxane, which will be in mid January.  Carrie Berry has a good understanding of the overall plan. She knows the goal of treatment is control. She also knows to call for any problems that may develop before her next visit here.   Carrie Lumadue, PA-C   04/02/2013 4:51 PM

## 2013-04-02 NOTE — Telephone Encounter (Signed)
I ha ve adjusted 11/25 appt

## 2013-04-07 ENCOUNTER — Ambulatory Visit: Payer: BC Managed Care – PPO

## 2013-04-07 ENCOUNTER — Other Ambulatory Visit: Payer: BC Managed Care – PPO | Admitting: Lab

## 2013-04-08 ENCOUNTER — Ambulatory Visit (HOSPITAL_BASED_OUTPATIENT_CLINIC_OR_DEPARTMENT_OTHER): Payer: BC Managed Care – PPO | Admitting: Physician Assistant

## 2013-04-08 ENCOUNTER — Ambulatory Visit (HOSPITAL_BASED_OUTPATIENT_CLINIC_OR_DEPARTMENT_OTHER): Payer: BC Managed Care – PPO

## 2013-04-08 ENCOUNTER — Encounter: Payer: Self-pay | Admitting: Physician Assistant

## 2013-04-08 ENCOUNTER — Other Ambulatory Visit: Payer: Self-pay | Admitting: Physician Assistant

## 2013-04-08 ENCOUNTER — Telehealth: Payer: Self-pay | Admitting: *Deleted

## 2013-04-08 ENCOUNTER — Ambulatory Visit: Payer: BC Managed Care – PPO | Admitting: Family

## 2013-04-08 ENCOUNTER — Other Ambulatory Visit: Payer: BC Managed Care – PPO | Admitting: Lab

## 2013-04-08 ENCOUNTER — Other Ambulatory Visit (HOSPITAL_BASED_OUTPATIENT_CLINIC_OR_DEPARTMENT_OTHER): Payer: BC Managed Care – PPO | Admitting: Lab

## 2013-04-08 VITALS — BP 127/74 | HR 80 | Temp 98.4°F | Resp 19 | Ht 63.0 in | Wt 132.2 lb

## 2013-04-08 DIAGNOSIS — C787 Secondary malignant neoplasm of liver and intrahepatic bile duct: Secondary | ICD-10-CM

## 2013-04-08 DIAGNOSIS — K219 Gastro-esophageal reflux disease without esophagitis: Secondary | ICD-10-CM

## 2013-04-08 DIAGNOSIS — C7800 Secondary malignant neoplasm of unspecified lung: Secondary | ICD-10-CM

## 2013-04-08 DIAGNOSIS — G4700 Insomnia, unspecified: Secondary | ICD-10-CM

## 2013-04-08 DIAGNOSIS — C50911 Malignant neoplasm of unspecified site of right female breast: Secondary | ICD-10-CM

## 2013-04-08 DIAGNOSIS — C50419 Malignant neoplasm of upper-outer quadrant of unspecified female breast: Secondary | ICD-10-CM

## 2013-04-08 DIAGNOSIS — J3489 Other specified disorders of nose and nasal sinuses: Secondary | ICD-10-CM

## 2013-04-08 DIAGNOSIS — C50411 Malignant neoplasm of upper-outer quadrant of right female breast: Secondary | ICD-10-CM

## 2013-04-08 DIAGNOSIS — R51 Headache: Secondary | ICD-10-CM

## 2013-04-08 DIAGNOSIS — Z5111 Encounter for antineoplastic chemotherapy: Secondary | ICD-10-CM

## 2013-04-08 DIAGNOSIS — J329 Chronic sinusitis, unspecified: Secondary | ICD-10-CM

## 2013-04-08 DIAGNOSIS — N644 Mastodynia: Secondary | ICD-10-CM

## 2013-04-08 LAB — CBC WITH DIFFERENTIAL/PLATELET
BASO%: 0.3 % (ref 0.0–2.0)
Eosinophils Absolute: 0.2 10*3/uL (ref 0.0–0.5)
LYMPH%: 20.8 % (ref 14.0–49.7)
MCHC: 32.7 g/dL (ref 31.5–36.0)
MCV: 91 fL (ref 79.5–101.0)
MONO#: 0.5 10*3/uL (ref 0.1–0.9)
MONO%: 7.6 % (ref 0.0–14.0)
NEUT#: 4.9 10*3/uL (ref 1.5–6.5)
Platelets: 281 10*3/uL (ref 145–400)
RBC: 3.77 10*6/uL (ref 3.70–5.45)
RDW: 13.8 % (ref 11.2–14.5)
WBC: 7.1 10*3/uL (ref 3.9–10.3)
lymph#: 1.5 10*3/uL (ref 0.9–3.3)

## 2013-04-08 MED ORDER — HEPARIN SOD (PORK) LOCK FLUSH 100 UNIT/ML IV SOLN
500.0000 [IU] | Freq: Once | INTRAVENOUS | Status: DC | PRN
  Filled 2013-04-08: qty 5

## 2013-04-08 MED ORDER — PACLITAXEL PROTEIN-BOUND CHEMO INJECTION 100 MG
100.0000 mg/m2 | Freq: Once | INTRAVENOUS | Status: AC
  Administered 2013-04-08: 150 mg via INTRAVENOUS
  Filled 2013-04-08: qty 30

## 2013-04-08 MED ORDER — LORAZEPAM 2 MG/ML IJ SOLN
INTRAMUSCULAR | Status: AC
  Filled 2013-04-08: qty 1

## 2013-04-08 MED ORDER — DEXAMETHASONE SODIUM PHOSPHATE 10 MG/ML IJ SOLN
10.0000 mg | Freq: Once | INTRAMUSCULAR | Status: AC
  Administered 2013-04-08: 10 mg via INTRAVENOUS

## 2013-04-08 MED ORDER — SODIUM CHLORIDE 0.9 % IJ SOLN
10.0000 mL | INTRAMUSCULAR | Status: DC | PRN
  Filled 2013-04-08: qty 10

## 2013-04-08 MED ORDER — DEXAMETHASONE SODIUM PHOSPHATE 10 MG/ML IJ SOLN
INTRAMUSCULAR | Status: AC
  Filled 2013-04-08: qty 1

## 2013-04-08 MED ORDER — LORAZEPAM 2 MG/ML IJ SOLN
0.5000 mg | Freq: Once | INTRAMUSCULAR | Status: AC
  Administered 2013-04-08: 0.5 mg via INTRAVENOUS
  Filled 2013-04-08: qty 1

## 2013-04-08 MED ORDER — AZITHROMYCIN 250 MG PO TABS: ORAL_TABLET | ORAL | Status: DC

## 2013-04-08 MED ORDER — SODIUM CHLORIDE 0.9 % IV SOLN
Freq: Once | INTRAVENOUS | Status: AC
  Administered 2013-04-08: 13:00:00 via INTRAVENOUS

## 2013-04-08 NOTE — Telephone Encounter (Signed)
appts made and printed. Pt is aware that tx will be added. i emailed MW.pt is aware cs will call for CT..td

## 2013-04-08 NOTE — Progress Notes (Signed)
Patient ID: Carrie Berry, female   DOB: Aug 23, 1950, 62 y.o.   MRN: [id number] ID: [name] OB: May 15, 1951  MR#: [id number]  CSN#:[id number]  PCP: Katy Apo, MD GYN:   SU: Avel Peace OTHER MD: Lurline Hare, Danise Edge, Rick Cornella, Melodye Ped  CHIEF COMPLAINT:  Metastatic Breast Cancer   HISTORY OF PRESENT ILLNESS: Carrie Berry noted a mass in her right breast December of 2013, but did not think much of it. It did grow some lower the summer, but she was keeping her grandchild at that time and was too busy so she did not bring it to Dr. Lynnette Caffey attention until August. He set her up for bilateral mammography and ultrasonography at [facility] 12/18/2012 and this measured, on the right, a large irregular lobulated mass in the upper outer quadrant which by ultrasound measured 5.7 cm. The right axilla showed some lymph nodes with thickened cortices. In the left breast there was a focal asymmetry at the depth, lateral to the nipple, and by ultrasound there was a 9 mm ill-defined hypoechoic lesion in this location. Biopsy of the left breast lesion showed only fibrocystic changes.  Biopsy of the right breast mass and right axillary adenopathy ([id number]) on 12/23/2012 showed both to be involved by invasive ductal carcinoma, grade 2, triple negative, with an MIB-1 of 86%.  MRI of the breast 12/27/2012 at [facility] imaging showed in the right breast a mass abutting the pectoralis muscle without enhancement of the muscle measuring 4.8 cm. The satellite nodule measuring approximately 3 mm was also noted superior to the mass and several abnormal and enlarged right axillary lymph nodes were noted, one of which appeared to be necrotic. The largest node measured 2.0 cm. Unfortunately, numerous bilateral pulmonary nodules were also noted.  The patient's subsequent history is as detailed below   INTERVAL HISTORY: Carrie Berry returns alone today for followup of her metastatic breast cancer. She is currently  being treated with Abraxane, with treatments on days one and 8 of each 21 day cycle, and is due for day 1 cycle 2 of treatment today. She feels that she is tolerating the Abraxane well. She's had no problems with nausea or emesis. She also denies any signs of numbness or tingling in the extremities.  Her biggest complaints today are some continued insomnia, continued sinus congestion, and pain in the right breast.  She is taking Ambien every other night, and takes trazodone, both of which she finds helpful for sleeping. She has had increased sinus congestion this past week, some associated sinus headaches, and notes that the mucus is now greenish with some evidence of blood. Fortunately, she denies any fevers or chills, has had no cough, and denies shortness of breath.  She feels like the pain is a little worse in the right breast. It is more noticeable to her, and can feel the firmness of the breast under her arm when she moves certain ways.   REVIEW OF SYSTEMS:   Carrie Berry denies any hot flashes or night sweats.  She's had no mouth ulcers, oral sensitivity, or problems swallowing. Her appetite is still good, and she's had no problems with nausea or emesis. She's having regular bowel movements.  Carrie Berry continues to have some reflux occasionally but finds relief with omeprazole, 20 mg twice daily. She denies any chest pain or palpitations. She's had no dizziness or change in vision. Other than the pain in the right breast, she denies any additional pain, specifically no myalgias, arthralgias, bony pain, or peripheral swelling.  A detailed review of systems is otherwise stable and noncontributory.   PAST MEDICAL HISTORY: Past Medical History  Diagnosis Date  . Recurrent sinus infections   . Arthritis   . PONV (postoperative nausea and vomiting)   . Anxiety   . Cancer     breast  . Breast cancer   . GERD (gastroesophageal reflux disease)     PAST SURGICAL HISTORY: Past Surgical History  Procedure  Laterality Date  . Tubal ligation    . Tubal ligation  1985  . Portacath placement N/A 01/10/2013    Procedure: ULTRASOUND GUIDED PORT-A-CATH INSERTION WITH FLUOROSCOPY;  Surgeon: Adolph Pollack, MD;  Location: [facility] OR;  Service: General;  Laterality: N/A;    FAMILY HISTORY Family History  Problem Relation Age of Onset  . Bladder Cancer Father   . Colon cancer Maternal Grandmother    the patient's parents are living, both in their 10s. The patient's father was diagnosed with bladder cancer the age of 48. The patient's mother's mother had some type of gastrointestinal cancer. The patient had one brother, no sisters. There is no history of breast or ovarian cancer in the family other than a cousin on the father's side who was diagnosed with breast cancer apparently before the age of 46.  GYNECOLOGIC HISTORY:  Menarche age 24, first live birth age 26, the patient is GX P2. She went through menopause in 2008. She did not take hormone replacement. She is birth control for approximately 22 years remotely.  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 She's currently on disability. Her husband Carrie Berry"), is vice Conservation officer, nature for [employer]. The patient's daughter Carrie Berry is a stay-at-home mom in [place], the patient's son Carrie Berry unfortunately died in an automobile accident in December of 2011. The patient has 2 grandchildren. She attends to a local [employer].    ADVANCED DIRECTIVES: Not in place   HEALTH MAINTENANCE: (Updated 03/19/2013) History  Substance Use Topics  . Smoking status: Never Smoker   . Smokeless tobacco: Never Used  . Alcohol Use: No     Colonoscopy: 2005  PAP: Not on file  Bone density: Never  Lipid panel:  Dr. Nehemiah Settle   Allergies  Allergen Reactions  . Ondansetron Hcl Other (See Comments)    HEADACHE  . Codeine Nausea  And Vomiting    "violently ill"  . Penicillins Rash    Childhood reaction -    Current Outpatient Prescriptions  Medication Sig Dispense Refill  . acetaminophen (TYLENOL) 325 MG tablet Take 650 mg by mouth every 8 (eight) hours as needed for pain.      Marland Kitchen lidocaine-prilocaine (EMLA) cream Apply topically as needed.  30 g  3  . loratadine-pseudoephedrine (CLARITIN-D 24-HOUR) 10-240 MG per 24 hr tablet Take 1 tablet by mouth daily.      Marland Kitchen LORazepam (ATIVAN) 1 MG tablet Take 0.5 mg by mouth every 6 (six) hours as needed for anxiety.       . Multiple Vitamin (MULTIVITAMIN WITH MINERALS) TABS tablet Take 1 tablet by mouth daily.      Marland Kitchen omeprazole (PRILOSEC) 20 MG capsule Take 1 capsule (20 mg total) by mouth 2 (two) times daily.  60 capsule  3  . traZODone (DESYREL) 50 MG tablet Take 1-2 tablets (50-100 mg total) by mouth at bedtime. For sleep  60 tablet  0  . triamcinolone (NASACORT) 55 MCG/ACT nasal inhaler  Place 2 sprays into the nose daily.      Marland Kitchen zolpidem (AMBIEN) 5 MG tablet       . azithromycin (ZITHROMAX) 250 MG tablet 2 tabs by mouth x 1 day, then 1 tab by mouth x 4 days  6 each  0  . PATADAY 0.2 % SOLN Place 1 drop into both eyes daily as needed (dry eyes).       . prochlorperazine (COMPAZINE) 10 MG tablet Take 1 tablet (10 mg total) by mouth every 6 (six) hours as needed for nausea.  30 tablet  2  . promethazine (PHENERGAN) 25 MG tablet Take 1 tablet (25 mg total) by mouth every 6 (six) hours as needed for nausea or vomiting.  30 tablet  1  . traMADol (ULTRAM) 50 MG tablet Take 50 mg by mouth every 6 (six) hours as needed for pain.        No current facility-administered medications for this visit.    OBJECTIVE: Middle-aged white woman who appears stated age and is in no acute distress Filed Vitals:   04/08/13 1129  BP: 127/74  Pulse: 80  Temp: 98.4 F (36.9 C)  Resp: 19     Body mass index is 23.42 kg/(m^2).    ECOG FS: 1 Filed Weights   04/08/13 1129  Weight: 132 lb 3.2 oz  (59.966 kg)   Physical Exam: HEENT:  Sclerae anicteric.  Oropharynx clear with no ulcerations or evidence of candidiasis. No pharyngeal erythema or exudate. Nasal mucosa is slightly erythematous and edematous. NODES:  No cervical or supraclavicular lymphadenopathy palpated.  BREAST EXAM:  In the right breast, there is still a large, firm mass in the lateral portion of the breast, occupying the lateral half of the breast. This appears stable as compared to last week's exam. There is a palpable nodule in the right axilla, the left axilla being benign.  LUNGS:  Clear to auscultation bilaterally.  No wheezes or rhonchi.  HEART:  Regular rate and rhythm. No murmur appreciated. ABDOMEN:  Soft, nontender to palpation.  Positive bowel sounds. No hepatomegaly palpated. MSK:  No focal spinal tenderness to palpation. Good range of motion in the upper extremities. EXTREMITIES:  No peripheral edema.   SKIN:  Port is intact in the chest wall, no erythema or edema noted, and no evidence of infection. No additional rashes or skin changes noted on exam. NEURO:  Nonfocal. Well oriented.  Positive affect.   LAB RESULTS:   Lab Results  Component Value Date   WBC 7.1 04/08/2013   NEUTROABS 4.9 04/08/2013   HGB 11.2* 04/08/2013   HCT 34.3* 04/08/2013   MCV 91.0 04/08/2013   PLT 281 04/08/2013      Chemistry      Component Value Date/Time   NA 140 04/02/2013 1022   NA 136 02/28/2013 2230   K 4.2 04/02/2013 1022   K 3.4* 02/28/2013 2230   CL 99 02/28/2013 2230   CO2 26 04/02/2013 1022   CO2 28 02/28/2013 2230   BUN 14.2 04/02/2013 1022   BUN 8 02/28/2013 2230   CREATININE 0.8 04/02/2013 1022   CREATININE 0.59 02/28/2013 2230      Component Value Date/Time   CALCIUM 9.6 04/02/2013 1022   CALCIUM 9.0 02/28/2013 2230   ALKPHOS 95 04/02/2013 1022   ALKPHOS 102 02/28/2013 2230   AST 23 04/02/2013 1022   AST 19 02/28/2013 2230   ALT 19 04/02/2013 1022   ALT 18 02/28/2013 2230   BILITOT 0.25  04/02/2013 1022   BILITOT 0.3 02/28/2013 2230       STUDIES:  Ct Chest W Contrast  03/17/2013   CLINICAL DATA:  Breast cancer, chemotherapy and progress. Right chest pain.  EXAM: CT CHEST WITH CONTRAST  TECHNIQUE: Multidetector CT imaging of the chest was performed during intravenous contrast administration.  CONTRAST:  80mL OMNIPAQUE IOHEXOL 300 MG/ML  SOLN  COMPARISON:  PET 01/07/2013 and CT chest 01/07/2013.  FINDINGS: No pathologically enlarged mediastinal, hilar or left axillary lymph nodes. Some right axillary lymph nodes have increased in prominence in the interval. For example, an 11 mm short axis lymph node (image 18) has enlarged from 7 mm. Largest 1.6 cm short axis lymph node is stable. Necrotic appearing right breast mass measures 4.7 x 4.7 cm (previously 3.7 x 4.3 cm). No internal mammary adenopathy. Heart size within normal limits. No pericardial effusion.  Multiple bilateral pulmonary nodules are again seen, some of which are larger. Index nodule along the medial aspect of the right major fissure (image 26) measures 7 x 9 mm (previously 4 x 6 mm). No pleural fluid. Airway is unremarkable.  Incidental imaging of the upper abdomen shows multiple new and enlarging heterogeneous lesions in the liver, measuring up to 1.7 cm in the right hepatic lobe (previously 1.3 cm). Prominent left extrarenal pelvis. No definite worrisome lytic or sclerotic lesions.  IMPRESSION: Interval progression of metastatic breast cancer, as evidenced by an enlarging primary right breast mass, enlarging right axillary lymph nodes, enlarging pulmonary nodules and new/enlarging hepatic metastases. These results were called by telephone at the time of interpretation on 03/17/2013 at 3:39 PM to Dr. Ruthann Cancer , who verbally acknowledged these results.   Electronically Signed   By: Leanna Battles M.D.   On: 03/17/2013 15:44    ASSESSMENT: 62 y.o. Carrie Berry, [place] woman status post right breast upper outer quadrant and right  axillary lymph node biopsy 12/23/2012 for a clinical T3 N1 M1, stage IV invasive ductal carcinoma, grade 3, triple negative, with an MIB-1 of 86%.  (1) right liver lobe biopsy 01/21/2013 confirms metastatic adenocarcinoma; scans show involvement of the liver, lungs, and likely bone.   (2) enrolled in [facility] study [id number] (docetaxel + oral gamma secretase inibitor [id number]), received one cycle starting 02/04/2013  but withdrew because of poor tolerance despite treatment interruptions and decreased dosing of the oral component  (3)  chest CT in early Berry 2014 was compared with studies in August 2014 and showed interval progression of metastatic breast cancer, with an enlarging primary right breast mass, enlarging right axillary lymph nodes, enlarging pulmonary nodules and new/enlarging hepatic metastases.  (4) Abraxane started 03/19/2013, the goal being for treatment day 1 and day 8 of each 21 day cycle, with restaging after 4 cycles.  PLAN: Carrie Berry will proceed with treatment today as scheduled for day 1 cycle 2 of Abraxane. She'll be treated again next week on December 2, and I will see her 3 weeks from now when she returns on December 16 4 physical exam prior to day 1 cycle 3.  She'll continue with the same antinausea regimen she used after the last dose of Abraxane, taking a reduced dose of prochlorperazine at 5 mg, alternating with promethazine as necessary. Specifically, she will take promethazine at night in place of prochlorperazine to help her sleep.   She will continue using Claritin-D in addition to her Nasacort nasal spray. I am also prescribing a Z-Pak for apparent sinusitis. She will let us know if she has any  fevers or chills, or if any of her symptoms worsen.  Our overall plan is to restage after 4 cycles (8 doses) of Abraxane, which will be in mid January, and we will get that chest CT scheduled for her accordingly.  Carrie Berry has a good understanding of the overall plan. She knows the  goal of treatment is control. She is in the process of dealing with her disability at work, and will let us know what she needs with regards to paperwork. She knows as always to call for any problems that may develop before her next visit here.   Carrie Marasco, PA-C   04/08/2013 12:04 PM

## 2013-04-08 NOTE — Patient Instructions (Signed)
[facility] Discharge Instructions for Patients Receiving Chemotherapy  Today you received the following chemotherapy agents Abraxane.   To help prevent nausea and vomiting after your treatment, we encourage you to take your nausea medication.   If you develop nausea and vomiting that is not controlled by your nausea medication, call the clinic.   BELOW ARE SYMPTOMS THAT SHOULD BE REPORTED IMMEDIATELY:  *FEVER GREATER THAN 100.5 F  *CHILLS WITH OR WITHOUT FEVER  NAUSEA AND VOMITING THAT IS NOT CONTROLLED WITH YOUR NAUSEA MEDICATION  *UNUSUAL SHORTNESS OF BREATH  *UNUSUAL BRUISING OR BLEEDING  TENDERNESS IN MOUTH AND THROAT WITH OR WITHOUT PRESENCE OF ULCERS  *URINARY PROBLEMS  *BOWEL PROBLEMS  UNUSUAL RASH Items with * indicate a potential emergency and should be followed up as soon as possible.  Feel free to call the clinic you have any questions or concerns. The clinic phone number is [phone].    

## 2013-04-11 ENCOUNTER — Telehealth: Payer: Self-pay | Admitting: *Deleted

## 2013-04-11 NOTE — Telephone Encounter (Signed)
Per staff message and POF I have scheduled appts.  JMW  

## 2013-04-15 ENCOUNTER — Other Ambulatory Visit (HOSPITAL_BASED_OUTPATIENT_CLINIC_OR_DEPARTMENT_OTHER): Payer: BC Managed Care – PPO | Admitting: Lab

## 2013-04-15 ENCOUNTER — Ambulatory Visit (HOSPITAL_BASED_OUTPATIENT_CLINIC_OR_DEPARTMENT_OTHER): Payer: BC Managed Care – PPO

## 2013-04-15 VITALS — BP 111/69 | HR 78 | Temp 98.3°F | Resp 18

## 2013-04-15 DIAGNOSIS — C50419 Malignant neoplasm of upper-outer quadrant of unspecified female breast: Secondary | ICD-10-CM

## 2013-04-15 DIAGNOSIS — C50411 Malignant neoplasm of upper-outer quadrant of right female breast: Secondary | ICD-10-CM

## 2013-04-15 DIAGNOSIS — Z5111 Encounter for antineoplastic chemotherapy: Secondary | ICD-10-CM

## 2013-04-15 DIAGNOSIS — C7800 Secondary malignant neoplasm of unspecified lung: Secondary | ICD-10-CM

## 2013-04-15 DIAGNOSIS — C787 Secondary malignant neoplasm of liver and intrahepatic bile duct: Secondary | ICD-10-CM

## 2013-04-15 LAB — CBC WITH DIFFERENTIAL/PLATELET
Eosinophils Absolute: 0.2 10*3/uL (ref 0.0–0.5)
HCT: 35.8 % (ref 34.8–46.6)
HGB: 12 g/dL (ref 11.6–15.9)
LYMPH%: 22.6 % (ref 14.0–49.7)
MONO#: 0.4 10*3/uL (ref 0.1–0.9)
MONO%: 5.4 % (ref 0.0–14.0)
NEUT#: 5 10*3/uL (ref 1.5–6.5)
NEUT%: 68.2 % (ref 38.4–76.8)
Platelets: 297 10*3/uL (ref 145–400)
RBC: 4 10*6/uL (ref 3.70–5.45)
RDW: 13.5 % (ref 11.2–14.5)
WBC: 7.4 10*3/uL (ref 3.9–10.3)
lymph#: 1.7 10*3/uL (ref 0.9–3.3)
nRBC: 0 % (ref 0–0)

## 2013-04-15 LAB — COMPREHENSIVE METABOLIC PANEL (CC13)
ALT: 22 U/L (ref 0–55)
Albumin: 3.8 g/dL (ref 3.5–5.0)
Anion Gap: 13 mEq/L — ABNORMAL HIGH (ref 3–11)
BUN: 13.7 mg/dL (ref 7.0–26.0)
CO2: 24 mEq/L (ref 22–29)
Calcium: 9.7 mg/dL (ref 8.4–10.4)
Chloride: 105 mEq/L (ref 98–109)
Creatinine: 0.8 mg/dL (ref 0.6–1.1)
Potassium: 4.5 mEq/L (ref 3.5–5.1)

## 2013-04-15 MED ORDER — LORAZEPAM 2 MG/ML IJ SOLN
INTRAMUSCULAR | Status: AC
  Filled 2013-04-15: qty 1

## 2013-04-15 MED ORDER — HEPARIN SOD (PORK) LOCK FLUSH 100 UNIT/ML IV SOLN
500.0000 [IU] | Freq: Once | INTRAVENOUS | Status: AC | PRN
  Administered 2013-04-15: 500 [IU]
  Filled 2013-04-15: qty 5

## 2013-04-15 MED ORDER — SODIUM CHLORIDE 0.9 % IV SOLN
Freq: Once | INTRAVENOUS | Status: AC
  Administered 2013-04-15: 11:00:00 via INTRAVENOUS

## 2013-04-15 MED ORDER — DEXAMETHASONE SODIUM PHOSPHATE 10 MG/ML IJ SOLN
INTRAMUSCULAR | Status: AC
  Filled 2013-04-15: qty 1

## 2013-04-15 MED ORDER — PACLITAXEL PROTEIN-BOUND CHEMO INJECTION 100 MG
100.0000 mg/m2 | Freq: Once | INTRAVENOUS | Status: AC
  Administered 2013-04-15: 150 mg via INTRAVENOUS
  Filled 2013-04-15: qty 30

## 2013-04-15 MED ORDER — SODIUM CHLORIDE 0.9 % IJ SOLN
10.0000 mL | INTRAMUSCULAR | Status: DC | PRN
  Administered 2013-04-15: 10 mL
  Filled 2013-04-15: qty 10

## 2013-04-15 MED ORDER — DEXAMETHASONE SODIUM PHOSPHATE 10 MG/ML IJ SOLN
10.0000 mg | Freq: Once | INTRAMUSCULAR | Status: AC
  Administered 2013-04-15: 10 mg via INTRAVENOUS

## 2013-04-15 MED ORDER — LORAZEPAM 2 MG/ML IJ SOLN
0.5000 mg | Freq: Once | INTRAMUSCULAR | Status: AC
  Administered 2013-04-15: 0.5 mg via INTRAVENOUS

## 2013-04-15 NOTE — Patient Instructions (Signed)
[facility] Discharge Instructions for Patients Receiving Chemotherapy  Today you received the following chemotherapy agents Abraxane  To help prevent nausea and vomiting after your treatment, we encourage you to take your nausea medication as prescribed.   If you develop nausea and vomiting that is not controlled by your nausea medication, call the clinic.   BELOW ARE SYMPTOMS THAT SHOULD BE REPORTED IMMEDIATELY:  *FEVER GREATER THAN 100.5 F  *CHILLS WITH OR WITHOUT FEVER  NAUSEA AND VOMITING THAT IS NOT CONTROLLED WITH YOUR NAUSEA MEDICATION  *UNUSUAL SHORTNESS OF BREATH  *UNUSUAL BRUISING OR BLEEDING  TENDERNESS IN MOUTH AND THROAT WITH OR WITHOUT PRESENCE OF ULCERS  *URINARY PROBLEMS  *BOWEL PROBLEMS  UNUSUAL RASH Items with * indicate a potential emergency and should be followed up as soon as possible.  Feel free to call the clinic you have any questions or concerns. The clinic phone number is [phone].    

## 2013-04-29 ENCOUNTER — Telehealth: Payer: Self-pay | Admitting: Oncology

## 2013-04-29 ENCOUNTER — Ambulatory Visit (HOSPITAL_BASED_OUTPATIENT_CLINIC_OR_DEPARTMENT_OTHER): Payer: BC Managed Care – PPO

## 2013-04-29 ENCOUNTER — Ambulatory Visit (HOSPITAL_BASED_OUTPATIENT_CLINIC_OR_DEPARTMENT_OTHER): Payer: BC Managed Care – PPO | Admitting: Physician Assistant

## 2013-04-29 ENCOUNTER — Encounter: Payer: Self-pay | Admitting: Physician Assistant

## 2013-04-29 ENCOUNTER — Other Ambulatory Visit (HOSPITAL_BASED_OUTPATIENT_CLINIC_OR_DEPARTMENT_OTHER): Payer: BC Managed Care – PPO

## 2013-04-29 VITALS — BP 131/69 | HR 86 | Temp 98.9°F | Resp 18 | Ht 63.0 in | Wt 134.3 lb

## 2013-04-29 DIAGNOSIS — C787 Secondary malignant neoplasm of liver and intrahepatic bile duct: Secondary | ICD-10-CM

## 2013-04-29 DIAGNOSIS — R0981 Nasal congestion: Secondary | ICD-10-CM

## 2013-04-29 DIAGNOSIS — C50419 Malignant neoplasm of upper-outer quadrant of unspecified female breast: Secondary | ICD-10-CM

## 2013-04-29 DIAGNOSIS — G4700 Insomnia, unspecified: Secondary | ICD-10-CM

## 2013-04-29 DIAGNOSIS — C7800 Secondary malignant neoplasm of unspecified lung: Secondary | ICD-10-CM

## 2013-04-29 DIAGNOSIS — C50411 Malignant neoplasm of upper-outer quadrant of right female breast: Secondary | ICD-10-CM

## 2013-04-29 DIAGNOSIS — Z5111 Encounter for antineoplastic chemotherapy: Secondary | ICD-10-CM

## 2013-04-29 DIAGNOSIS — Z171 Estrogen receptor negative status [ER-]: Secondary | ICD-10-CM

## 2013-04-29 DIAGNOSIS — H04203 Unspecified epiphora, bilateral lacrimal glands: Secondary | ICD-10-CM

## 2013-04-29 DIAGNOSIS — C50911 Malignant neoplasm of unspecified site of right female breast: Secondary | ICD-10-CM

## 2013-04-29 DIAGNOSIS — H04209 Unspecified epiphora, unspecified lacrimal gland: Secondary | ICD-10-CM

## 2013-04-29 LAB — CBC WITH DIFFERENTIAL/PLATELET
BASO%: 0.7 % (ref 0.0–2.0)
Basophils Absolute: 0.1 10*3/uL (ref 0.0–0.1)
EOS%: 6 % (ref 0.0–7.0)
HCT: 32.5 % — ABNORMAL LOW (ref 34.8–46.6)
HGB: 10.6 g/dL — ABNORMAL LOW (ref 11.6–15.9)
LYMPH%: 17.1 % (ref 14.0–49.7)
MCH: 29.8 pg (ref 25.1–34.0)
MCHC: 32.6 g/dL (ref 31.5–36.0)
MONO#: 0.6 10*3/uL (ref 0.1–0.9)
NEUT%: 66.8 % (ref 38.4–76.8)
Platelets: 262 10*3/uL (ref 145–400)
nRBC: 0 % (ref 0–0)

## 2013-04-29 LAB — COMPREHENSIVE METABOLIC PANEL (CC13)
AST: 23 U/L (ref 5–34)
Albumin: 3.6 g/dL (ref 3.5–5.0)
Alkaline Phosphatase: 92 U/L (ref 40–150)
BUN: 14.1 mg/dL (ref 7.0–26.0)
Calcium: 9.6 mg/dL (ref 8.4–10.4)
Chloride: 106 mEq/L (ref 98–109)
Creatinine: 0.7 mg/dL (ref 0.6–1.1)
Glucose: 91 mg/dl (ref 70–140)
Total Bilirubin: 0.32 mg/dL (ref 0.20–1.20)

## 2013-04-29 MED ORDER — DEXAMETHASONE SODIUM PHOSPHATE 10 MG/ML IJ SOLN
INTRAMUSCULAR | Status: AC
  Filled 2013-04-29: qty 1

## 2013-04-29 MED ORDER — TRAMADOL HCL 50 MG PO TABS: 50.0000 mg | ORAL_TABLET | Freq: Four times a day (QID) | ORAL | Status: DC | PRN

## 2013-04-29 MED ORDER — LORAZEPAM 2 MG/ML IJ SOLN
INTRAMUSCULAR | Status: AC
  Filled 2013-04-29: qty 1

## 2013-04-29 MED ORDER — SODIUM CHLORIDE 0.9 % IJ SOLN
10.0000 mL | INTRAMUSCULAR | Status: DC | PRN
  Administered 2013-04-29: 10 mL
  Filled 2013-04-29: qty 10

## 2013-04-29 MED ORDER — ZOLPIDEM TARTRATE 5 MG PO TABS: 5.0000 mg | ORAL_TABLET | Freq: Every evening | ORAL | Status: DC | PRN

## 2013-04-29 MED ORDER — DEXAMETHASONE SODIUM PHOSPHATE 20 MG/5ML IJ SOLN
INTRAMUSCULAR | Status: AC
  Filled 2013-04-29: qty 5

## 2013-04-29 MED ORDER — ACETAMINOPHEN 325 MG PO TABS
ORAL_TABLET | ORAL | Status: AC
  Filled 2013-04-29: qty 1

## 2013-04-29 MED ORDER — LORAZEPAM 2 MG/ML IJ SOLN
0.5000 mg | Freq: Once | INTRAMUSCULAR | Status: AC
  Administered 2013-04-29: 0.5 mg via INTRAVENOUS

## 2013-04-29 MED ORDER — PALONOSETRON HCL INJECTION 0.25 MG/5ML
INTRAVENOUS | Status: AC
  Filled 2013-04-29: qty 5

## 2013-04-29 MED ORDER — TOBRAMYCIN-DEXAMETHASONE 0.3-0.1 % OP SUSP: 1.0000 [drp] | Freq: Every day | OPHTHALMIC | Status: DC

## 2013-04-29 MED ORDER — PACLITAXEL PROTEIN-BOUND CHEMO INJECTION 100 MG
100.0000 mg/m2 | Freq: Once | INTRAVENOUS | Status: AC
  Administered 2013-04-29: 150 mg via INTRAVENOUS
  Filled 2013-04-29: qty 30

## 2013-04-29 MED ORDER — HEPARIN SOD (PORK) LOCK FLUSH 100 UNIT/ML IV SOLN
500.0000 [IU] | Freq: Once | INTRAVENOUS | Status: AC | PRN
  Administered 2013-04-29: 500 [IU]
  Filled 2013-04-29: qty 5

## 2013-04-29 MED ORDER — DEXAMETHASONE SODIUM PHOSPHATE 10 MG/ML IJ SOLN
10.0000 mg | Freq: Once | INTRAMUSCULAR | Status: AC
  Administered 2013-04-29: 10 mg via INTRAVENOUS

## 2013-04-29 MED ORDER — SODIUM CHLORIDE 0.9 % IV SOLN
Freq: Once | INTRAVENOUS | Status: AC
  Administered 2013-04-29: 11:00:00 via INTRAVENOUS

## 2013-04-29 NOTE — Progress Notes (Signed)
Patient ID: Carrie Berry, female   DOB: [birth year], 62 y.o.   MRN: [id number] ID: [name] OB: 08/11/1950  MR#: [id number]  NWG#:[id number]  PCP: Katy Apo, MD GYN:   SU: Avel Peace OTHER MD: Lurline Hare, Danise Edge, Rick Cornella, Melodye Ped  CHIEF COMPLAINT:  Metastatic Breast Cancer   HISTORY OF PRESENT ILLNESS: Carrie Berry noted a mass in her right breast December of 2013, but did not think much of it. It did grow some lower the summer, but she was keeping her grandchild at that time and was too busy so she did not bring it to Dr. Lynnette Caffey attention until August. He set her up for bilateral mammography and ultrasonography at [facility] 12/18/2012 and this measured, on the right, a large irregular lobulated mass in the upper outer quadrant which by ultrasound measured 5.7 cm. The right axilla showed some lymph nodes with thickened cortices. In the left breast there was a focal asymmetry at the depth, lateral to the nipple, and by ultrasound there was a 9 mm ill-defined hypoechoic lesion in this location. Biopsy of the left breast lesion showed only fibrocystic changes.  Biopsy of the right breast mass and right axillary adenopathy ([id number]) on 12/23/2012 showed both to be involved by invasive ductal carcinoma, grade 2, triple negative, with an MIB-1 of 86%.  MRI of the breast 12/27/2012 at [facility] imaging showed in the right breast a mass abutting the pectoralis muscle without enhancement of the muscle measuring 4.8 cm. The satellite nodule measuring approximately 3 mm was also noted superior to the mass and several abnormal and enlarged right axillary lymph nodes were noted, one of which appeared to be necrotic. The largest node measured 2.0 cm. Unfortunately, numerous bilateral pulmonary nodules were also noted.  The patient's subsequent history is as detailed below   INTERVAL HISTORY: [name] returns alone today for followup of her metastatic breast cancer. She is currently  being treated with Abraxane, with treatments on days one and 8 of each 21 day cycle, and is due for day 1 cycle 3 of treatment today. (She has received a total of 4 doses of Abraxane up to this point.) She continues to tolerate the Abraxane itself well, and has had no associated side effects. Specifically, she denies any signs of peripheral neuropathy. She also denies any problems with nausea or emesis, has had a good appetite, and denies any mouth ulcers or oral sensitivity.  Carrie Berry is very concerned today, however, with the fact that she is having increased pain in the lateral portion of the right breast, the area of her local disease. Previously, it seemed like the area was softer and less painful, but she tells me that over the last 2 weeks she is experiencing more "sharp pain". The masses seem to be  "protruding" more and she tells me she is now aware of them most of the time. She has been taking a little more pain medication, specifically tramadol. Without pain medication the pain in the breast is often an 8 on a scale of 1-10, but with tramadol this decreases to a 3.  She requests a refill on this pain medication today.  She still has some problems sleeping, but is doing well alternating Ambien and trazodone. She requests a refill on the Ambien 5 mg today.  REVIEW OF SYSTEMS:   Niva denies any fevers, chills, hot flashes or night sweats.  She's having regular bowel movements.  Carrie Berry continues to have some reflux occasionally but continues on omeprazole  which she finds helpful. She denies any increased cough, shortness of breath, chest pain, or palpitations. She still has some mild sinus congestion but this has improved since completing her recent antibiotic. She denies any abnormal headaches. She's had no dizziness or change in vision. She does note that her eyes are tearing more, and she recently lost her eyelashes. Other than the pain in the right breast, she denies any additional pain, specifically no  myalgias, arthralgias, bony pain, or peripheral swelling.   A detailed review of systems is otherwise stable and noncontributory.   PAST MEDICAL HISTORY: Past Medical History  Diagnosis Date  . Recurrent sinus infections   . Arthritis   . PONV (postoperative nausea and vomiting)   . Anxiety   . Cancer     breast  . Breast cancer   . GERD (gastroesophageal reflux disease)     PAST SURGICAL HISTORY: Past Surgical History  Procedure Laterality Date  . Tubal ligation    . Tubal ligation  1985  . Portacath placement N/A 01/10/2013    Procedure: ULTRASOUND GUIDED PORT-A-CATH INSERTION WITH FLUOROSCOPY;  Surgeon: Adolph Pollack, MD;  Location: [facility] OR;  Service: General;  Laterality: N/A;    FAMILY HISTORY Family History  Problem Relation Age of Onset  . Bladder Cancer Father   . Colon cancer Maternal Grandmother    the patient's parents are living, both in their 26s. The patient's father was diagnosed with bladder cancer the age of 27. The patient's mother's mother had some type of gastrointestinal cancer. The patient had one brother, no sisters. There is no history of breast or ovarian cancer in the family other than a cousin on the father's side who was diagnosed with breast cancer apparently before the age of 50.  GYNECOLOGIC HISTORY:  Menarche age 77, first live birth age 78, the patient is GX P2. She went through menopause in 2008. She did not take hormone replacement. She is birth control for approximately 22 years remotely.  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 She's currently on disability. Her husband Pahoua Schreiner [name]"), is vice Conservation officer, nature for [employer]. The patient's daughter Carrie Berry is a stay-at-home mom in [place], the patient's son Carrie Berry unfortunately died in an automobile accident in December of 2011. The patient has 2 grandchildren.  She attends to a local [employer].    ADVANCED DIRECTIVES: Not in place   HEALTH MAINTENANCE: (Updated December 2014) History  Substance Use Topics  . Smoking status: Never Smoker   . Smokeless tobacco: Never Used  . Alcohol Use: No     Colonoscopy: 2005  PAP: Not on file  Bone density: Never  Lipid panel:  Dr. Nehemiah Settle   Allergies  Allergen Reactions  . Ondansetron Hcl Other (See Comments)    HEADACHE  . Codeine Nausea And Vomiting    "violently ill"  . Penicillins Rash    Childhood reaction -    Current Outpatient Prescriptions  Medication Sig Dispense Refill  . acetaminophen (TYLENOL) 325 MG tablet Take 650 mg by mouth every 8 (eight) hours as needed for pain.      Marland Kitchen lidocaine-prilocaine (EMLA) cream Apply topically as needed.  30 g  3  . LORazepam (ATIVAN) 1 MG tablet Take 0.5 mg by mouth every 6 (six) hours as needed for anxiety.       . Multiple Vitamin (MULTIVITAMIN WITH MINERALS) TABS tablet Take  1 tablet by mouth daily.      Marland Kitchen omeprazole (PRILOSEC) 20 MG capsule Take 1 capsule (20 mg total) by mouth 2 (two) times daily.  60 capsule  3  . traMADol (ULTRAM) 50 MG tablet Take 1 tablet (50 mg total) by mouth every 6 (six) hours as needed for moderate pain.  60 tablet  0  . traZODone (DESYREL) 50 MG tablet Take 1-2 tablets (50-100 mg total) by mouth at bedtime. For sleep  60 tablet  0  . triamcinolone (NASACORT) 55 MCG/ACT nasal inhaler Place 2 sprays into the nose daily.      Marland Kitchen zolpidem (AMBIEN) 5 MG tablet Take 1 tablet (5 mg total) by mouth at bedtime as needed for sleep.  20 tablet  0  . loratadine-pseudoephedrine (CLARITIN-D 24-HOUR) 10-240 MG per 24 hr tablet Take 1 tablet by mouth daily.      Marland Kitchen PATADAY 0.2 % SOLN Place 1 drop into both eyes daily as needed (dry eyes).       . prochlorperazine (COMPAZINE) 10 MG tablet Take 1 tablet (10 mg total) by mouth every 6 (six) hours as needed for nausea.  30 tablet  2  . promethazine (PHENERGAN) 25 MG tablet  Take 1 tablet (25 mg total) by mouth every 6 (six) hours as needed for nausea or vomiting.  30 tablet  1  . tobramycin-dexamethasone (TOBRADEX) ophthalmic solution Place 1 drop into both eyes daily.  5 mL  1   No current facility-administered medications for this visit.    OBJECTIVE: Middle-aged white woman who appears stated age and is in no acute distress Filed Vitals:   04/29/13 0909  BP: 131/69  Pulse: 86  Temp: 98.9 F (37.2 C)  Resp: 18     Body mass index is 23.8 kg/(m^2).    ECOG FS: 1 Filed Weights   04/29/13 0909  Weight: 134 lb 4.8 oz (60.918 kg)   Physical Exam: HEENT:  Sclerae anicteric.  Oropharynx clear  And moist with no ulcerations or evidence of candidiasis.  NODES:  No cervical or supraclavicular lymphadenopathy palpated.  BREAST EXAM:  In the right breast, there is a large, firm mass in the lateral portion of the breast, occupying the lateral half of the breast. This appears slightly larger than with previous exam, now beginning to extend past the midline of the right breast. The nodules on the lateral portion are in fact protruding further, are harder, and her also tender to palpation. The skin is slightly darker over these masses. There is a palpable nodule in the right axilla, the left axilla being benign. Left breast is unremarkable. LUNGS:  Clear to auscultation bilaterally.  No wheezes or rhonchi.  HEART:  Regular rate and rhythm. No murmur appreciated. ABDOMEN:  Soft, nontender to palpation.  Positive bowel sounds. No organomegaly or masses palpated. MSK:  No focal spinal tenderness to palpation. Good range of motion in the upper extremities. EXTREMITIES:  No peripheral edema.   SKIN:  Port is intact in the chest wall, no erythema or edema noted, and no evidence of infection/cellulitis. No additional rashes or skin changes noted on exam. No nail dyscrasia. NEURO:  Nonfocal. Well oriented.  Positive affect.   LAB RESULTS:   Lab Results  Component Value  Date   WBC 6.8 04/29/2013   NEUTROABS 4.5 04/29/2013   HGB 10.6* 04/29/2013   HCT 32.5* 04/29/2013   MCV 91.3 04/29/2013   PLT 262 04/29/2013      Chemistry  Component Value Date/Time   NA 141 04/29/2013 0901   NA 136 02/28/2013 2230   K 4.2 04/29/2013 0901   K 3.4* 02/28/2013 2230   CL 99 02/28/2013 2230   CO2 28 04/29/2013 0901   CO2 28 02/28/2013 2230   BUN 14.1 04/29/2013 0901   BUN 8 02/28/2013 2230   CREATININE 0.7 04/29/2013 0901   CREATININE 0.59 02/28/2013 2230      Component Value Date/Time   CALCIUM 9.6 04/29/2013 0901   CALCIUM 9.0 02/28/2013 2230   ALKPHOS 92 04/29/2013 0901   ALKPHOS 102 02/28/2013 2230   AST 23 04/29/2013 0901   AST 19 02/28/2013 2230   ALT 18 04/29/2013 0901   ALT 18 02/28/2013 2230   BILITOT 0.32 04/29/2013 0901   BILITOT 0.3 02/28/2013 2230       STUDIES:  Ct Chest W Contrast  03/17/2013   CLINICAL DATA:  Breast cancer, chemotherapy and progress. Right chest pain.  EXAM: CT CHEST WITH CONTRAST  TECHNIQUE: Multidetector CT imaging of the chest was performed during intravenous contrast administration.  CONTRAST:  80mL OMNIPAQUE IOHEXOL 300 MG/ML  SOLN  COMPARISON:  PET 01/07/2013 and CT chest 01/07/2013.  FINDINGS: No pathologically enlarged mediastinal, hilar or left axillary lymph nodes. Some right axillary lymph nodes have increased in prominence in the interval. For example, an 11 mm short axis lymph node (image 18) has enlarged from 7 mm. Largest 1.6 cm short axis lymph node is stable. Necrotic appearing right breast mass measures 4.7 x 4.7 cm (previously 3.7 x 4.3 cm). No internal mammary adenopathy. Heart size within normal limits. No pericardial effusion.  Multiple bilateral pulmonary nodules are again seen, some of which are larger. Index nodule along the medial aspect of the right major fissure (image 26) measures 7 x 9 mm (previously 4 x 6 mm). No pleural fluid. Airway is unremarkable.  Incidental imaging of the upper abdomen  shows multiple new and enlarging heterogeneous lesions in the liver, measuring up to 1.7 cm in the right hepatic lobe (previously 1.3 cm). Prominent left extrarenal pelvis. No definite worrisome lytic or sclerotic lesions.  IMPRESSION: Interval progression of metastatic breast cancer, as evidenced by an enlarging primary right breast mass, enlarging right axillary lymph nodes, enlarging pulmonary nodules and new/enlarging hepatic metastases. These results were called by telephone at the time of interpretation on 03/17/2013 at 3:39 PM to Dr. Ruthann Cancer , who verbally acknowledged these results.   Electronically Signed   By: Leanna Battles M.D.   On: 03/17/2013 15:44    ASSESSMENT: 62 y.o. Jacquenette Sh[place], [place] woman status post right breast upper outer quadrant and right axillary lymph node biopsy 12/23/2012 for a clinical T3 N1 M1, stage IV invasive ductal carcinoma, grade 3, triple negative, with an MIB-1 of 86%.  (1) right liver lobe biopsy 01/21/2013 confirms metastatic adenocarcinoma; scans show involvement of the liver, lungs, and likely bone.   (2) enrolled in [facility] study [id number] (docetaxel + oral gamma secretase inibitor [id number]), received one cycle starting 02/04/2013  but withdrew because of poor tolerance despite treatment interruptions and decreased dosing of the oral component  (3)  chest CT in early November 2014 was compared with studies in August 2014 and showed interval progression of metastatic breast cancer, with an enlarging primary right breast mass, enlarging right axillary lymph nodes, enlarging pulmonary nodules and new/enlarging hepatic metastases.  (4) Abraxane started 03/19/2013, the goal being for treatment day 1 and day 8 of each 21 day cycle, with original plan  to restage after 4 cycles.  PLAN: Lashaun will proceed with treatment today as scheduled for day 1 cycle 3 of Abraxane, but this will likely be her last dose of Abraxane. Clinically, does seem like her disease is  progressing, and certainly the masses in the right breast are larger on physical exam.  Accordingly, we will hold Gabbrielle's Abraxane treatment next week, and instead we'll send her for a chest CT for further evaluation. We will also obtain a baseline echo within the next week, and she will return to see Dr. Darnelle Catalan for a 1 hour visit on December 29 to review these results and discuss a change in treatment plan. She does know that there are many options for treatment, but we are likely going to have to choose something a little more aggressive than the Abraxane which does not seem to be controlling her disease.  Bob has a good understanding of the overall plan in voices her agreement with the above. She knows the goal of treatment is control.  She knows as always to call for any problems that may develop before her next visit here.  Of note, I am refilling her Ambien and tramadol as noted above, and I have also prescribed TobraDex to be used for increased epiphora.   Erleen Egner, PA-C   04/29/2013 5:29 PM

## 2013-04-29 NOTE — Patient Instructions (Signed)
[facility] Discharge Instructions for Patients Receiving Chemotherapy  Today you received the following chemotherapy agents:  Abraxane  To help prevent nausea and vomiting after your treatment, we encourage you to take your nausea medication as ordered per MD.   If you develop nausea and vomiting that is not controlled by your nausea medication, call the clinic.   BELOW ARE SYMPTOMS THAT SHOULD BE REPORTED IMMEDIATELY:  *FEVER GREATER THAN 100.5 F  *CHILLS WITH OR WITHOUT FEVER  NAUSEA AND VOMITING THAT IS NOT CONTROLLED WITH YOUR NAUSEA MEDICATION  *UNUSUAL SHORTNESS OF BREATH  *UNUSUAL BRUISING OR BLEEDING  TENDERNESS IN MOUTH AND THROAT WITH OR WITHOUT PRESENCE OF ULCERS  *URINARY PROBLEMS  *BOWEL PROBLEMS  UNUSUAL RASH Items with * indicate a potential emergency and should be followed up as soon as possible.  Feel free to call the clinic you have any questions or concerns. The clinic phone number is [phone].    

## 2013-05-02 ENCOUNTER — Ambulatory Visit (HOSPITAL_COMMUNITY)
Admission: RE | Admit: 2013-05-02 | Discharge: 2013-05-02 | Disposition: A | Discharge status: Home / Self Care | Payer: BC Managed Care – PPO | Source: Ambulatory Visit | Attending: Physician Assistant | Admitting: Physician Assistant

## 2013-05-02 DIAGNOSIS — C50411 Malignant neoplasm of upper-outer quadrant of right female breast: Secondary | ICD-10-CM

## 2013-05-02 DIAGNOSIS — Z09 Encounter for follow-up examination after completed treatment for conditions other than malignant neoplasm: Secondary | ICD-10-CM

## 2013-05-02 DIAGNOSIS — C50919 Malignant neoplasm of unspecified site of unspecified female breast: Secondary | ICD-10-CM | POA: Insufficient documentation

## 2013-05-02 DIAGNOSIS — C50911 Malignant neoplasm of unspecified site of right female breast: Secondary | ICD-10-CM

## 2013-05-02 NOTE — Progress Notes (Signed)
  Echocardiogram 2D Echocardiogram has been performed.  Georgian Co 05/02/2013, 4:46 PM

## 2013-05-06 ENCOUNTER — Other Ambulatory Visit: Payer: BC Managed Care – PPO

## 2013-05-06 ENCOUNTER — Ambulatory Visit: Payer: BC Managed Care – PPO | Admitting: Physician Assistant

## 2013-05-06 ENCOUNTER — Ambulatory Visit: Payer: BC Managed Care – PPO

## 2013-05-12 ENCOUNTER — Other Ambulatory Visit (HOSPITAL_BASED_OUTPATIENT_CLINIC_OR_DEPARTMENT_OTHER): Payer: BC Managed Care – PPO

## 2013-05-12 ENCOUNTER — Other Ambulatory Visit: Payer: Self-pay | Admitting: *Deleted

## 2013-05-12 ENCOUNTER — Ambulatory Visit (HOSPITAL_BASED_OUTPATIENT_CLINIC_OR_DEPARTMENT_OTHER): Payer: BC Managed Care – PPO | Admitting: Oncology

## 2013-05-12 ENCOUNTER — Telehealth: Payer: Self-pay | Admitting: Oncology

## 2013-05-12 ENCOUNTER — Telehealth: Payer: Self-pay | Admitting: *Deleted

## 2013-05-12 ENCOUNTER — Encounter: Payer: Self-pay | Admitting: *Deleted

## 2013-05-12 VITALS — BP 100/64 | HR 71 | Temp 97.8°F | Resp 18 | Ht 63.0 in | Wt 129.7 lb

## 2013-05-12 DIAGNOSIS — C50419 Malignant neoplasm of upper-outer quadrant of unspecified female breast: Secondary | ICD-10-CM

## 2013-05-12 DIAGNOSIS — C50411 Malignant neoplasm of upper-outer quadrant of right female breast: Secondary | ICD-10-CM

## 2013-05-12 DIAGNOSIS — C787 Secondary malignant neoplasm of liver and intrahepatic bile duct: Secondary | ICD-10-CM

## 2013-05-12 DIAGNOSIS — C50911 Malignant neoplasm of unspecified site of right female breast: Secondary | ICD-10-CM

## 2013-05-12 DIAGNOSIS — C7800 Secondary malignant neoplasm of unspecified lung: Secondary | ICD-10-CM

## 2013-05-12 DIAGNOSIS — G4700 Insomnia, unspecified: Secondary | ICD-10-CM

## 2013-05-12 LAB — CBC WITH DIFFERENTIAL/PLATELET
BASO%: 0.6 % (ref 0.0–2.0)
Basophils Absolute: 0.1 10*3/uL (ref 0.0–0.1)
EOS%: 2.3 % (ref 0.0–7.0)
HCT: 35.9 % (ref 34.8–46.6)
HGB: 12 g/dL (ref 11.6–15.9)
MCH: 30.5 pg (ref 25.1–34.0)
MCHC: 33.3 g/dL (ref 31.5–36.0)
MONO#: 0.6 10*3/uL (ref 0.1–0.9)
MONO%: 6.5 % (ref 0.0–14.0)
RDW: 14.8 % — ABNORMAL HIGH (ref 11.2–14.5)
WBC: 10 10*3/uL (ref 3.9–10.3)
lymph#: 1.4 10*3/uL (ref 0.9–3.3)

## 2013-05-12 LAB — COMPREHENSIVE METABOLIC PANEL (CC13)
ALT: 19 U/L (ref 0–55)
AST: 24 U/L (ref 5–34)
Albumin: 3.8 g/dL (ref 3.5–5.0)
Anion Gap: 8 mEq/L (ref 3–11)
BUN: 12.9 mg/dL (ref 7.0–26.0)
CO2: 28 mEq/L (ref 22–29)
Calcium: 9.6 mg/dL (ref 8.4–10.4)
Chloride: 106 mEq/L (ref 98–109)
Glucose: 88 mg/dl (ref 70–140)
Potassium: 4.8 mEq/L (ref 3.5–5.1)
Total Protein: 7 g/dL (ref 6.4–8.3)

## 2013-05-12 MED ORDER — TRAMADOL HCL 50 MG PO TABS: 50.0000 mg | ORAL_TABLET | Freq: Four times a day (QID) | ORAL | Status: DC | PRN

## 2013-05-12 MED ORDER — ONDANSETRON HCL 8 MG PO TABS: 8.0000 mg | ORAL_TABLET | Freq: Two times a day (BID) | ORAL | Status: DC | PRN

## 2013-05-12 MED ORDER — DEXAMETHASONE 4 MG PO TABS: ORAL_TABLET | ORAL | Status: DC

## 2013-05-12 MED ORDER — PROCHLORPERAZINE MALEATE 10 MG PO TABS: 10.0000 mg | ORAL_TABLET | Freq: Four times a day (QID) | ORAL | Status: DC | PRN

## 2013-05-12 MED ORDER — LORAZEPAM 0.5 MG PO TABS: 0.5000 mg | ORAL_TABLET | Freq: Four times a day (QID) | ORAL | Status: DC | PRN

## 2013-05-12 MED ORDER — ONDANSETRON HCL 8 MG PO TABS: ORAL_TABLET | ORAL | Status: DC

## 2013-05-12 NOTE — Progress Notes (Signed)
Received pics from Dr. Darnelle Catalan.  Printed and took to [employer] in [facility] to scan.

## 2013-05-12 NOTE — Progress Notes (Signed)
Patient ID: Carrie Berry, female   DOB: 09-05-1950, 62 y.o.   MRN: [id number] ID: Carrie Berry OB: [birth year]  MR#: [id number]  CSN#:[id number]  PCP: Carrie Apo, MD GYN:   SU: Avel Peace OTHER MD: Lurline Hare, Danise Edge, Rick Cornella, Melodye Ped  CHIEF COMPLAINT:  Metastatic Breast Cancer   HISTORY OF PRESENT ILLNESS: Carrie Berry noted a mass in her right breast December of 2013, but did not think much of it. It did grow some lower the summer, but she was keeping her grandchild at that time and was too busy so she did not bring it to Dr. Lynnette Caffey attention until August. He set her up for bilateral mammography and ultrasonography at [facility] 12/18/2012 and this measured, on the right, a large irregular lobulated mass in the upper outer quadrant which by ultrasound measured 5.7 cm. The right axilla showed some lymph nodes with thickened cortices. In the left breast there was a focal asymmetry at the depth, lateral to the nipple, and by ultrasound there was a 9 mm ill-defined hypoechoic lesion in this location. Biopsy of the left breast lesion showed only fibrocystic changes.  Biopsy of the right breast mass and right axillary adenopathy ([id number]) on 12/23/2012 showed both to be involved by invasive ductal carcinoma, grade 2, triple negative, with an MIB-1 of 86%.  MRI of the breast 12/27/2012 at [facility] imaging showed in the right breast a mass abutting the pectoralis muscle without enhancement of the muscle measuring 4.8 cm. The satellite nodule measuring approximately 3 mm was also noted superior to the mass and several abnormal and enlarged right axillary lymph nodes were noted, one of which appeared to be necrotic. The largest node measured 2.0 cm. Unfortunately, numerous bilateral pulmonary nodules were also noted.  The patient's subsequent history is as detailed below   INTERVAL HISTORY: Carrie Berry returns today with her husband Carrie Berry for followup of her breast cancer. Today is day  14 cycle 3 of protein bound paclitaxel (Abraxane). Her day 8 treatment was held because she had obvious progression in the right breast by exam. She was set up for a CT scan which for some reason has not yet been done. She also had an echocardiogram which shows an excellent ejection fraction. She is here today to discuss alternatives.  REVIEW OF SYSTEMS:   [name] lost her hair and her eyelashes with the Abraxane. The hair is coming back nicely. She had no peripheral neuropathy complications either from the Abraxane or from the docetaxel dose she received at [facility]. She did have some fatigue, but she managed to have a wonderful time with her family in this holiday. Nausea was not a problem. A detailed review of systems is significant chiefly for pain associated with the right breast area. She has been taking naproxen for this during the day and Percocet at night. A detailed review of systems today was otherwise noncontributory  PAST MEDICAL HISTORY: Past Medical History  Diagnosis Date  . Recurrent sinus infections   . Arthritis   . PONV (postoperative nausea and vomiting)   . Anxiety   . Cancer     breast  . Breast cancer   . GERD (gastroesophageal reflux disease)     PAST SURGICAL HISTORY: Past Surgical History  Procedure Laterality Date  . Tubal ligation    . Tubal ligation  1985  . Portacath placement N/A 01/10/2013    Procedure: ULTRASOUND GUIDED PORT-A-CATH INSERTION WITH FLUOROSCOPY;  Surgeon: Adolph Pollack, MD;  Location: [facility] OR;  Service: General;  Laterality: N/A;    FAMILY HISTORY Family History  Problem Relation Age of Onset  . Bladder Cancer Father   . Colon cancer Maternal Grandmother    the patient's parents are living, both in their 25s. The patient's father was diagnosed with bladder cancer the age of 63. The patient's mother's mother had some type of gastrointestinal cancer. The patient had one brother, no sisters. There is no history of breast or ovarian cancer in the  family other than a cousin on the father's side who was diagnosed with breast cancer apparently before the age of 45.  GYNECOLOGIC HISTORY:  Menarche age 68, first live birth age 32, the patient is [place] P2. She went through menopause in 2008. She did not take hormone replacement. She is birth control for approximately 22 years remotely.  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 She's currently on disability. Her husband Carrie Berry [name]"), is vice Conservation officer, nature for [employer]. The patient's daughter Carrie Berry is a stay-at-home mom in [place], the patient's son Carrie Berry unfortunately died in an automobile accident in December of 2011. The patient has 2 grandchildren. She attends to a local [employer].    ADVANCED DIRECTIVES: Not in place   HEALTH MAINTENANCE: (Updated December 2014) History  Substance Use Topics  . Smoking status: Never Smoker   . Smokeless tobacco: Never Used  . Alcohol Use: No     Colonoscopy: 2005  PAP: Not on file  Bone density: Never  Lipid panel:  Dr. Nehemiah Settle   Allergies  Allergen Reactions  . Ondansetron Hcl Other (See Comments)    HEADACHE  . Codeine Nausea And Vomiting    "violently ill"  . Penicillins Rash    Childhood reaction -    Current Outpatient Prescriptions  Medication Sig Dispense Refill  . acetaminophen (TYLENOL) 325 MG tablet Take 650 mg by mouth every 8 (eight) hours as needed for pain.      Marland Kitchen lidocaine-prilocaine (EMLA) cream Apply topically as needed.  30 g  3  . loratadine-pseudoephedrine (CLARITIN-D 24-HOUR) 10-240 MG per 24 hr tablet Take 1 tablet by mouth daily.      Marland Kitchen LORazepam (ATIVAN) 1 MG tablet Take 0.5 mg by mouth every 6 (six) hours as needed for anxiety.       . Multiple Vitamin (MULTIVITAMIN WITH MINERALS) TABS tablet Take 1 tablet by mouth daily.      Marland Kitchen omeprazole (PRILOSEC) 20 MG  capsule Take 1 capsule (20 mg total) by mouth 2 (two) times daily.  60 capsule  3  . PATADAY 0.2 % SOLN Place 1 drop into both eyes daily as needed (dry eyes).       . prochlorperazine (COMPAZINE) 10 MG tablet Take 1 tablet (10 mg total) by mouth every 6 (six) hours as needed for nausea.  30 tablet  2  . promethazine (PHENERGAN) 25 MG tablet Take 1 tablet (25 mg total) by mouth every 6 (six) hours as needed for nausea or vomiting.  30 tablet  1  . tobramycin-dexamethasone (TOBRADEX) ophthalmic solution Place 1 drop into both eyes daily.  5 mL  1  . traMADol (ULTRAM) 50 MG tablet Take 1 tablet (50 mg total) by mouth every 6 (six) hours as needed for moderate pain.  60 tablet  0  . traZODone (DESYREL) 50 MG tablet Take 1-2 tablets (50-100 mg total) by mouth at bedtime. For  sleep  60 tablet  0  . triamcinolone (NASACORT) 55 MCG/ACT nasal inhaler Place 2 sprays into the nose daily.      Marland Kitchen zolpidem (AMBIEN) 5 MG tablet Take 1 tablet (5 mg total) by mouth at bedtime as needed for sleep.  20 tablet  0   No current facility-administered medications for this visit.    OBJECTIVE: Middle-aged white woman who appears stated age  37 Vitals:   05/12/13 0943  BP: 100/64  Pulse: 71  Temp: 97.8 F (36.6 C)  Resp: 18     Body mass index is 22.98 kg/(m^2).    ECOG FS: 1 Filed Weights   05/12/13 0943  Weight: 129 lb 11.2 oz (58.832 kg)   Sclerae unicteric, pupils equal and reactive Oropharynx clear and moist-- no thrush No cervical or supraclavicular adenopathy Lungs no rales or rhonchi Heart regular rate and rhythm Abd soft, nontender, positive bowel sounds MSK no focal spinal tenderness, no upper extremity lymphedema Neuro: nonfocal, well oriented, appropriate affect Breasts: The mass in the right breast appears larger than the last time that I examined that. More importantly, there are couple of erythematous areas suggesting that the mass may break through the skin in the near future. A  photograph of the right breast was made today and a separately scanned. The mass however is movable, suggesting that it is not yet attached to the chest wall.  LAB RESULTS:   Lab Results  Component Value Date   WBC 10.0 05/12/2013   NEUTROABS 7.6* 05/12/2013   HGB 12.0 05/12/2013   HCT 35.9 05/12/2013   MCV 91.3 05/12/2013   PLT 291 05/12/2013      Chemistry      Component Value Date/Time   NA 141 04/29/2013 0901   NA 136 02/28/2013 2230   K 4.2 04/29/2013 0901   K 3.4* 02/28/2013 2230   CL 99 02/28/2013 2230   CO2 28 04/29/2013 0901   CO2 28 02/28/2013 2230   BUN 14.1 04/29/2013 0901   BUN 8 02/28/2013 2230   CREATININE 0.7 04/29/2013 0901   CREATININE 0.59 02/28/2013 2230      Component Value Date/Time   CALCIUM 9.6 04/29/2013 0901   CALCIUM 9.0 02/28/2013 2230   ALKPHOS 92 04/29/2013 0901   ALKPHOS 102 02/28/2013 2230   AST 23 04/29/2013 0901   AST 19 02/28/2013 2230   ALT 18 04/29/2013 0901   ALT 18 02/28/2013 2230   BILITOT 0.32 04/29/2013 0901   BILITOT 0.3 02/28/2013 2230       STUDIES: Transthoracic Echocardiography  Patient: Carrie Berry, Carrie Berry MR #: [id number] Study Date: 05/02/2013 Gender: F Age: 55 Height: 162.6cm Weight: 60.8kg BSA: 1.48m^2 Pt. Status: Room:  PERFORMING [place], [facility] REFERRING [place], Darnelle Bos, Amy SONOGRAPHER Georgian Co, RDCS, CCT cc:  ------------------------------------------------------------ LV EF: 55% - 60%  ------------------------------------------------------------ History: PMH: Breast Cancer   ASSESSMENT: 62 y.o. Carrie Berry, [place] woman status post right breast upper outer quadrant and right axillary lymph node biopsy 12/23/2012 for a clinical T3 N1 M1, stage IV invasive ductal carcinoma, grade 3, triple negative, with an MIB-1 of 86%.  (1) right liver lobe biopsy 01/21/2013 confirms metastatic adenocarcinoma; scans show involvement of the liver, lungs, and likely bone.  (2) enrolled in [facility]  study [id number] (docetaxel + oral gamma secretase inibitor [id number]), received one cycle starting 02/04/2013  but withdrew because of poor tolerance despite treatment interruptions and decreased dosing of the oral component  (3)  chest CT in early November  2014 was compared with studies in August 2014 and showed interval progression of metastatic breast cancer, with an enlarging primary right breast mass, enlarging right axillary lymph nodes, enlarging pulmonary nodules and new/enlarging hepatic metastases.  (4) Abraxane started 03/19/2013, given day 1 and day 8 of each 21 day cycle, stopped with the day 1 cycle 3 dose (04/29/2013) because of local progression of disease  (5) cyclophosphamide and doxorubicin to be started January 6, in dose dense fashion, with Neulasta support, for 4 cycles, to be followed by definitive local treatment.   PLAN: We spent the better part of today's hour-long visit discussing the difference between local treatment and systemic treatment. I think she could possibly undergo right modified radical mastectomy at this point. I am very concerned that the tumors for breakthrough the skin, cause bleeding, ulceration, and more pain. However the concern is that while dealing with local control we are allowing the systemic cancer in the liver or lungs to continue to grow.  Accordingly what we decided to do is to treat systemically and aggressively both to control the systemic disease and also to shrink if possible the local disease. Though we generally prefer to use single agents with metastatic disease, in her case we're going to go with dose dense doxorubicin and cyclophosphamide for 4 cycles with Neulasta support. We discussed the possible toxicities, side effects and complications and I went ahead and wrote her prescriptions for the supportive and antinausea medications that she will need. We're going to start therapy January 6. She will see [place] again January 13 and then she will  see either me or the locums scheduled to start January 12 for [facility] January 20 treatment.  Today we also talked about pain control. She will use Naprosyn up to 3 times a day with food, and Ultram as needed. She will continue to use the Percocet at night.  Finally K. requested that I write a letter to her workplace requesting an extension of 4 leave. That is down separately.  Lucella has a good understanding of his overall plan. She agrees with it. She knows a goal of treatment in her case is control and that includes local control. She will see Dr. Maurie Boettcher sometime in February to start planning her definitive surgery. Amya knows to call for any problems that may develop before her next visit here.  Lowella Dell, MD   05/12/2013 10:03 AM

## 2013-05-12 NOTE — Telephone Encounter (Signed)
Per staff message and POF I have scheduled appts.  JMW  

## 2013-05-13 ENCOUNTER — Telehealth: Payer: Self-pay | Admitting: Hematology and Oncology

## 2013-05-14 ENCOUNTER — Ambulatory Visit: Payer: BC Managed Care – PPO | Admitting: Oncology

## 2013-05-16 ENCOUNTER — Encounter (HOSPITAL_COMMUNITY): Payer: Self-pay

## 2013-05-16 ENCOUNTER — Ambulatory Visit (HOSPITAL_COMMUNITY)
Admission: RE | Admit: 2013-05-16 | Discharge: 2013-05-16 | Disposition: A | Discharge status: Home / Self Care | Payer: BC Managed Care – PPO | Source: Ambulatory Visit | Attending: Physician Assistant | Admitting: Physician Assistant

## 2013-05-16 DIAGNOSIS — C50911 Malignant neoplasm of unspecified site of right female breast: Secondary | ICD-10-CM

## 2013-05-16 DIAGNOSIS — R599 Enlarged lymph nodes, unspecified: Secondary | ICD-10-CM | POA: Insufficient documentation

## 2013-05-16 DIAGNOSIS — C50411 Malignant neoplasm of upper-outer quadrant of right female breast: Secondary | ICD-10-CM

## 2013-05-16 DIAGNOSIS — C7800 Secondary malignant neoplasm of unspecified lung: Secondary | ICD-10-CM | POA: Insufficient documentation

## 2013-05-16 DIAGNOSIS — Z79899 Other long term (current) drug therapy: Secondary | ICD-10-CM | POA: Insufficient documentation

## 2013-05-16 DIAGNOSIS — C787 Secondary malignant neoplasm of liver and intrahepatic bile duct: Secondary | ICD-10-CM | POA: Insufficient documentation

## 2013-05-16 DIAGNOSIS — C50919 Malignant neoplasm of unspecified site of unspecified female breast: Secondary | ICD-10-CM | POA: Insufficient documentation

## 2013-05-16 MED ORDER — IOHEXOL 300 MG/ML  SOLN
80.0000 mL | Freq: Once | INTRAMUSCULAR | Status: AC | PRN
  Administered 2013-05-16: 80 mL via INTRAVENOUS

## 2013-05-20 ENCOUNTER — Ambulatory Visit (HOSPITAL_BASED_OUTPATIENT_CLINIC_OR_DEPARTMENT_OTHER): Payer: BC Managed Care – PPO | Admitting: Physician Assistant

## 2013-05-20 ENCOUNTER — Other Ambulatory Visit (HOSPITAL_BASED_OUTPATIENT_CLINIC_OR_DEPARTMENT_OTHER): Payer: BC Managed Care – PPO

## 2013-05-20 ENCOUNTER — Ambulatory Visit: Payer: BC Managed Care – PPO | Admitting: Oncology

## 2013-05-20 ENCOUNTER — Ambulatory Visit (HOSPITAL_BASED_OUTPATIENT_CLINIC_OR_DEPARTMENT_OTHER): Payer: BC Managed Care – PPO

## 2013-05-20 VITALS — BP 117/70 | HR 73 | Temp 98.7°F | Resp 18 | Ht 63.0 in | Wt 133.8 lb

## 2013-05-20 DIAGNOSIS — C50919 Malignant neoplasm of unspecified site of unspecified female breast: Secondary | ICD-10-CM

## 2013-05-20 DIAGNOSIS — Z171 Estrogen receptor negative status [ER-]: Secondary | ICD-10-CM

## 2013-05-20 DIAGNOSIS — C787 Secondary malignant neoplasm of liver and intrahepatic bile duct: Secondary | ICD-10-CM

## 2013-05-20 DIAGNOSIS — C50419 Malignant neoplasm of upper-outer quadrant of unspecified female breast: Secondary | ICD-10-CM

## 2013-05-20 DIAGNOSIS — C773 Secondary and unspecified malignant neoplasm of axilla and upper limb lymph nodes: Secondary | ICD-10-CM

## 2013-05-20 DIAGNOSIS — C7800 Secondary malignant neoplasm of unspecified lung: Secondary | ICD-10-CM

## 2013-05-20 DIAGNOSIS — C50411 Malignant neoplasm of upper-outer quadrant of right female breast: Secondary | ICD-10-CM

## 2013-05-20 DIAGNOSIS — Z5111 Encounter for antineoplastic chemotherapy: Secondary | ICD-10-CM

## 2013-05-20 LAB — CBC WITH DIFFERENTIAL/PLATELET
BASO%: 1 % (ref 0.0–2.0)
Basophils Absolute: 0.1 10*3/uL (ref 0.0–0.1)
EOS ABS: 0.2 10*3/uL (ref 0.0–0.5)
EOS%: 1.9 % (ref 0.0–7.0)
HEMATOCRIT: 35 % (ref 34.8–46.6)
HGB: 11.8 g/dL (ref 11.6–15.9)
LYMPH#: 1.5 10*3/uL (ref 0.9–3.3)
LYMPH%: 15.2 % (ref 14.0–49.7)
MCH: 30.6 pg (ref 25.1–34.0)
MCHC: 33.9 g/dL (ref 31.5–36.0)
MCV: 90.3 fL (ref 79.5–101.0)
MONO#: 0.6 10*3/uL (ref 0.1–0.9)
MONO%: 6.1 % (ref 0.0–14.0)
NEUT%: 75.8 % (ref 38.4–76.8)
NEUTROS ABS: 7.3 10*3/uL — AB (ref 1.5–6.5)
Platelets: 299 10*3/uL (ref 145–400)
RBC: 3.87 10*6/uL (ref 3.70–5.45)
RDW: 14.3 % (ref 11.2–14.5)
WBC: 9.7 10*3/uL (ref 3.9–10.3)

## 2013-05-20 MED ORDER — PALONOSETRON HCL INJECTION 0.25 MG/5ML
0.2500 mg | Freq: Once | INTRAVENOUS | Status: AC
  Administered 2013-05-20: 0.25 mg via INTRAVENOUS

## 2013-05-20 MED ORDER — DOXORUBICIN HCL CHEMO IV INJECTION 2 MG/ML
60.0000 mg/m2 | Freq: Once | INTRAVENOUS | Status: AC
  Administered 2013-05-20: 98 mg via INTRAVENOUS
  Filled 2013-05-20: qty 49

## 2013-05-20 MED ORDER — DEXAMETHASONE SODIUM PHOSPHATE 20 MG/5ML IJ SOLN
12.0000 mg | Freq: Once | INTRAMUSCULAR | Status: AC
  Administered 2013-05-20: 12 mg via INTRAVENOUS

## 2013-05-20 MED ORDER — PALONOSETRON HCL INJECTION 0.25 MG/5ML
INTRAVENOUS | Status: AC
  Filled 2013-05-20: qty 5

## 2013-05-20 MED ORDER — SODIUM CHLORIDE 0.9 % IV SOLN
150.0000 mg | Freq: Once | INTRAVENOUS | Status: AC
  Administered 2013-05-20: 150 mg via INTRAVENOUS
  Filled 2013-05-20: qty 5

## 2013-05-20 MED ORDER — SODIUM CHLORIDE 0.9 % IV SOLN
Freq: Once | INTRAVENOUS | Status: AC
  Administered 2013-05-20: 13:00:00 via INTRAVENOUS

## 2013-05-20 MED ORDER — SODIUM CHLORIDE 0.9 % IV SOLN
600.0000 mg/m2 | Freq: Once | INTRAVENOUS | Status: AC
  Administered 2013-05-20: 980 mg via INTRAVENOUS
  Filled 2013-05-20: qty 49

## 2013-05-20 MED ORDER — HEPARIN SOD (PORK) LOCK FLUSH 100 UNIT/ML IV SOLN
500.0000 [IU] | Freq: Once | INTRAVENOUS | Status: AC | PRN
  Administered 2013-05-20: 500 [IU]
  Filled 2013-05-20: qty 5

## 2013-05-20 MED ORDER — DEXAMETHASONE SODIUM PHOSPHATE 20 MG/5ML IJ SOLN
INTRAMUSCULAR | Status: AC
  Filled 2013-05-20: qty 5

## 2013-05-20 MED ORDER — SODIUM CHLORIDE 0.9 % IJ SOLN
10.0000 mL | INTRAMUSCULAR | Status: DC | PRN
  Administered 2013-05-20: 10 mL
  Filled 2013-05-20: qty 10

## 2013-05-20 NOTE — Progress Notes (Signed)
Patient ID: Carrie Berry, female   DOB: 1950-09-08, 63 y.o.   MRN: [id number] ID: Carrie Berry OB: 13-Jan-1951  MR#: [id number]  HYQ#:[id number]  PCP: [name], MD GYN:   SU: [name] OTHER MD: Thea Silversmith, Earle Gell, Rick Cornella, Erline Levine  CHIEF COMPLAINT:  Metastatic Breast Cancer   HISTORY OF PRESENT ILLNESS: [name] noted a mass in her right breast December of 2013, but did not think much of it. It did grow some lower the summer, but she was keeping her grandchild at that time and was too busy so she did not bring it to Dr. Bernell List attention until August. He set her up for bilateral mammography and ultrasonography at [facility] 12/18/2012 and this measured, on the right, a large irregular lobulated mass in the upper outer quadrant which by ultrasound measured 5.7 cm. The right axilla showed some lymph nodes with thickened cortices. In the left breast there was a focal asymmetry at the depth, lateral to the nipple, and by ultrasound there was a 9 mm ill-defined hypoechoic lesion in this location. Biopsy of the left breast lesion showed only fibrocystic changes.  Biopsy of the right breast mass and right axillary adenopathy ([id number]) on 12/23/2012 showed both to be involved by invasive ductal carcinoma, grade 2, triple negative, with an MIB-1 of 86%.  MRI of the breast 12/27/2012 at [facility] imaging showed in the right breast a mass abutting the pectoralis muscle without enhancement of the muscle measuring 4.8 cm. The satellite nodule measuring approximately 3 mm was also noted superior to the mass and several abnormal and enlarged right axillary lymph nodes were noted, one of which appeared to be necrotic. The largest node measured 2.0 cm. Unfortunately, numerous bilateral pulmonary nodules were also noted.  The patient's subsequent history is as detailed below   INTERVAL HISTORY: Carrie Berry returns today for followup of her breast cancer. Today is day 1cycle 1 of 4 planned  cycles of dose dense doxorubicin/ cyclophosphamide, with Neulasta support. He has obtained all her supportive medications and is using the numbering cream appropriately. We reviewed all at again today  REVIEW OF SYSTEMS:   Carrie Berry is controlling the pain associated with the cancer in her breast moderately successfully with nap proximal and. She does take tramadol at bedtime. She is not constipated, as she is on a diet rich in roughage. She has not had unusual headaches, visual changes, cough, phlegm production, pleurisy, shortness of breath, or problems with her bladder, fever, or fatigue. A detailed review of systems today was otherwise benign  PAST MEDICAL HISTORY: Past Medical History  Diagnosis Date  . Recurrent sinus infections   . Arthritis   . PONV (postoperative nausea and vomiting)   . Anxiety   . Cancer     breast  . Breast cancer   . GERD (gastroesophageal reflux disease)     PAST SURGICAL HISTORY: Past Surgical History  Procedure Laterality Date  . Tubal ligation    . Tubal ligation  1985  . Portacath placement N/A 01/10/2013    Procedure: ULTRASOUND GUIDED PORT-A-CATH INSERTION WITH FLUOROSCOPY;  Surgeon: [name], MD;  Location: [place];  Service: General;  Laterality: N/A;    FAMILY HISTORY Family History  Problem Relation Age of Onset  . Bladder Cancer Father   . Colon cancer Maternal Grandmother    the patient's parents are living, both in their 6s. The patient's father was diagnosed with bladder cancer the age of 14. The patient's mother's mother had  some type of gastrointestinal cancer. The patient had one brother, no sisters. There is no history of breast or ovarian cancer in the family other than a cousin on the father's side who was diagnosed with breast cancer apparently before the age of 83.  GYNECOLOGIC HISTORY:  Menarche age 44, first live birth age 84, the patient is [place] P2. She went through menopause in 2008. She did not take hormone replacement. She  is birth control for approximately 22 years remotely.  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 She's currently on disability. Her husband Thedora Rings [name]"), is vice Development worker, international aid for [employer]. The patient's daughter Carrie Berry is a stay-at-home mom in [place], the patient's son Carrie Berry unfortunately died in an automobile accident in December of 2011. The patient has 2 grandchildren. She attends to a local [employer].    ADVANCED DIRECTIVES: Not in place   HEALTH MAINTENANCE: (Updated December 2014) History  Substance Use Topics  . Smoking status: Never Smoker   . Smokeless tobacco: Never Used  . Alcohol Use: No     Colonoscopy: 2005  PAP: Not on file  Bone density: Never  Lipid panel:  Dr. Delfina Redwood   Allergies  Allergen Reactions  . Ondansetron Hcl Other (See Comments)    HEADACHE  . Codeine Nausea And Vomiting    "violently ill"  . Penicillins Rash    Childhood reaction -    Current Outpatient Prescriptions  Medication Sig Dispense Refill  . acetaminophen (TYLENOL) 325 MG tablet Take 650 mg by mouth every 8 (eight) hours as needed for pain.      Marland Kitchen dexamethasone (DECADRON) 4 MG tablet Take 2 tablets by mouth once a day on the day after chemotherapy and then take 2 tablets two times a day for 2 days. Take with food.  30 tablet  1  . lidocaine-prilocaine (EMLA) cream Apply topically as needed.  30 g  3  . LORazepam (ATIVAN) 0.5 MG tablet Take 1 tablet (0.5 mg total) by mouth every 6 (six) hours as needed (Nausea or vomiting).  30 tablet  0  . LORazepam (ATIVAN) 1 MG tablet Take 0.5 mg by mouth every 6 (six) hours as needed for anxiety.       . Multiple Vitamin (MULTIVITAMIN WITH MINERALS) TABS tablet Take 1 tablet by mouth daily.      Marland Kitchen omeprazole (PRILOSEC) 20 MG capsule Take 1 capsule (20 mg total) by mouth 2 (two) times daily.  60  capsule  3  . ondansetron (ZOFRAN) 8 MG tablet Start on day 3 post chemo  30 tablet  1  . prochlorperazine (COMPAZINE) 10 MG tablet Take 1 tablet (10 mg total) by mouth every 6 (six) hours as needed for nausea.  30 tablet  2  . promethazine (PHENERGAN) 25 MG tablet Take 1 tablet (25 mg total) by mouth every 6 (six) hours as needed for nausea or vomiting.  30 tablet  1  . tobramycin-dexamethasone (TOBRADEX) ophthalmic solution Place 1 drop into both eyes daily.  5 mL  1  . traMADol (ULTRAM) 50 MG tablet Take 1 tablet (50 mg total) by mouth every 6 (six) hours as needed for moderate pain.  60 tablet  0  . traZODone (DESYREL) 50 MG tablet Take 1-2 tablets (50-100 mg total) by mouth at bedtime. For sleep  60 tablet  0  . triamcinolone (NASACORT) 55 MCG/ACT nasal inhaler Place  2 sprays into the nose daily.      Marland Kitchen zolpidem (AMBIEN) 5 MG tablet Take 1 tablet (5 mg total) by mouth at bedtime as needed for sleep.  20 tablet  0  . loratadine-pseudoephedrine (CLARITIN-D 24-HOUR) 10-240 MG per 24 hr tablet Take 1 tablet by mouth daily.      Marland Kitchen PATADAY 0.2 % SOLN Place 1 drop into both eyes daily as needed (dry eyes).        No current facility-administered medications for this visit.    OBJECTIVE: Middle-aged white woman in no acute distress Filed Vitals:   05/20/13 1141  BP: 117/70  Pulse: 73  Temp: 98.7 F (37.1 C)  Resp: 18     Body mass index is 23.71 kg/(m^2).    ECOG FS: 1 Filed Weights   05/20/13 1141  Weight: 133 lb 12.8 oz (60.691 kg)   Sclerae unicteric, pupils equal and reactive Oropharynx clear and moist-- dentition in fair No cervical or supraclavicular adenopathy Lungs no rales or rhonchi Heart regular rate and rhythm, no murmur appreciated Abd soft, nontender, positive bowel sounds MSK no focal spinal tenderness, no upper extremity lymphedema Neuro: nonfocal, well oriented, positive affect Breasts: Not repeated. Please see photo obtained most recent visit  LAB  RESULTS:   Lab Results  Component Value Date   WBC 9.7 05/20/2013   NEUTROABS 7.3* 05/20/2013   HGB 11.8 05/20/2013   HCT 35.0 05/20/2013   MCV 90.3 05/20/2013   PLT 299 05/20/2013      Chemistry      Component Value Date/Time   NA 142 05/12/2013 0931   NA 136 02/28/2013 2230   K 4.8 05/12/2013 0931   K 3.4* 02/28/2013 2230   CL 99 02/28/2013 2230   CO2 28 05/12/2013 0931   CO2 28 02/28/2013 2230   BUN 12.9 05/12/2013 0931   BUN 8 02/28/2013 2230   CREATININE 0.8 05/12/2013 0931   CREATININE 0.59 02/28/2013 2230      Component Value Date/Time   CALCIUM 9.6 05/12/2013 0931   CALCIUM 9.0 02/28/2013 2230   ALKPHOS 89 05/12/2013 0931   ALKPHOS 102 02/28/2013 2230   AST 24 05/12/2013 0931   AST 19 02/28/2013 2230   ALT 19 05/12/2013 0931   ALT 18 02/28/2013 2230   BILITOT 0.34 05/12/2013 0931   BILITOT 0.3 02/28/2013 2230       STUDIES: Transthoracic Echocardiography  Patient: Khaniya, Kust MR #: [id number] Study Date: 05/02/2013 Gender: F Age: 14 Height: 162.6cm Weight: 60.8kg BSA: 1.82m^2 Pt. Status: Room:  PERFORMING [place], [place] Ravens, Amy SONOGRAPHER Mauricio Po, RDCS, CCT cc:  ------------------------------------------------------------ LV EF: 55% - 60%  ------------------------------------------------------------ History: PMH: Breast Cancer   ASSESSMENT: 63 y.o. [name], [place] woman status post right breast upper outer quadrant and right axillary lymph node biopsy 12/23/2012 for a clinical T3 N1 M1, stage IV invasive ductal carcinoma, grade 3, triple negative, with an MIB-1 of 86%.  (1) right liver lobe biopsy 01/21/2013 confirms metastatic adenocarcinoma; scans show involvement of the liver, lungs, and likely bone.  (2) enrolled in [facility] study [id number] (docetaxel + oral gamma secretase inibitor [id number]), received one cycle starting 02/04/2013  but withdrew because of poor tolerance despite treatment  interruptions and decreased dosing of the oral component  (3)  chest CT in early November 2014 was compared with studies in August 2014 and showed interval progression of metastatic breast cancer, with an enlarging primary right breast mass, enlarging right axillary  lymph nodes, enlarging pulmonary nodules and new/enlarging hepatic metastases.  (4) Abraxane started 03/19/2013, given day 1 and day 8 of each 21 day cycle, stopped with the day 1 cycle 3 dose (04/29/2013) because of local progression of disease  (5) cyclophosphamide and doxorubicin to be started January 6, in dose dense fashion, with Neulasta support, for 4 cycles, to be followed by definitive local treatment.   PLAN: Shaune is ready to start her more intensive chemotherapy treatment today. The goal is to control the disease systemically and at the same time improve the local situation so that she will be able to undergo definitive local treatment at some point. A secondary goal also is better pain control with less pain medication. The patient is a good understanding of the possible toxicities, side effects and complications of the drug she will receive today, and agrees to proceed. She has a good understanding on how to take her antiemetics. She will call with any problems that may develop before her next visit here.      Chauncey Cruel, MD   05/20/2013 12:19 PM

## 2013-05-20 NOTE — Patient Instructions (Addendum)
[facility] Discharge Instructions for Patients Receiving Chemotherapy  Today you received the following chemotherapy agents: Cytoxan, Adriamycin  To help prevent nausea and vomiting after your treatment, we encourage you to take your nausea medication as prescribed.   If you develop nausea and vomiting that is not controlled by your nausea medication, call the clinic.   BELOW ARE SYMPTOMS THAT SHOULD BE REPORTED IMMEDIATELY:  *FEVER GREATER THAN 100.5 F  *CHILLS WITH OR WITHOUT FEVER  NAUSEA AND VOMITING THAT IS NOT CONTROLLED WITH YOUR NAUSEA MEDICATION  *UNUSUAL SHORTNESS OF BREATH  *UNUSUAL BRUISING OR BLEEDING  TENDERNESS IN MOUTH AND THROAT WITH OR WITHOUT PRESENCE OF ULCERS  *URINARY PROBLEMS  *BOWEL PROBLEMS  UNUSUAL RASH Items with * indicate a potential emergency and should be followed up as soon as possible.  Feel free to call the clinic you have any questions or concerns. The clinic phone number is [phone].   Cyclophosphamide injection - Cytoxan What is this medicine? CYCLOPHOSPHAMIDE (sye kloe FOSS fa mide) is a chemotherapy drug. It slows the growth of cancer cells. This medicine is used to treat many types of cancer like lymphoma, myeloma, leukemia, breast cancer, and ovarian cancer, to name a few. This medicine may be used for other purposes; ask your health care provider or pharmacist if you have questions. COMMON BRAND NAME(S): Cytoxan, Neosar What should I tell my health care provider before I take this medicine? They need to know if you have any of these conditions: -blood disorders -history of other chemotherapy -infection -kidney disease -liver disease -recent or ongoing radiation therapy -tumors in the bone marrow -an unusual or allergic reaction to cyclophosphamide, other chemotherapy, other medicines, foods, dyes, or preservatives -pregnant or trying to get pregnant -breast-feeding How should I use this medicine? This  drug is usually given as an injection into a vein or muscle or by infusion into a vein. It is administered in a hospital or clinic by a specially trained health care professional. Talk to your pediatrician regarding the use of this medicine in children. Special care may be needed. Overdosage: If you think you have taken too much of this medicine contact a poison control center or emergency room at once. NOTE: This medicine is only for you. Do not share this medicine with others. What if I miss a dose? It is important not to miss your dose. Call your doctor or health care professional if you are unable to keep an appointment. What may interact with this medicine? This medicine may interact with the following medications: -amiodarone -amphotericin B -azathioprine -certain antiviral medicines for HIV or AIDS such as protease inhibitors (e.g., indinavir, ritonavir) and zidovudine -certain blood pressure medications such as benazepril, captopril, enalapril, fosinopril, lisinopril, moexipril, monopril, perindopril, quinapril, ramipril, trandolapril -certain cancer medications such as anthracyclines (e.g., daunorubicin, doxorubicin), busulfan, cytarabine, paclitaxel, pentostatin, tamoxifen, trastuzumab -certain diuretics such as chlorothiazide, chlorthalidone, hydrochlorothiazide, indapamide, metolazone -certain medicines that treat or prevent blood clots like warfarin -certain muscle relaxants such as succinylcholine -cyclosporine -etanercept -indomethacin -medicines to increase blood counts like filgrastim, pegfilgrastim, sargramostim -medicines used as general anesthesia -metronidazole -natalizumab This list may not describe all possible interactions. Give your health care provider a list of all the medicines, herbs, non-prescription drugs, or dietary supplements you use. Also tell them if you smoke, drink alcohol, or use illegal drugs. Some items may interact with your medicine. What should I  watch for while using this medicine? Visit your doctor for checks on your progress. This drug  may make you feel generally unwell. This is not uncommon, as chemotherapy can affect healthy cells as well as cancer cells. Report any side effects. Continue your course of treatment even though you feel ill unless your doctor tells you to stop. Drink water or other fluids as directed. Urinate often, even at night. In some cases, you may be given additional medicines to help with side effects. Follow all directions for their use. Call your doctor or health care professional for advice if you get a fever, chills or sore throat, or other symptoms of a cold or flu. Do not treat yourself. This drug decreases your body's ability to fight infections. Try to avoid being around people who are sick. This medicine may increase your risk to bruise or bleed. Call your doctor or health care professional if you notice any unusual bleeding. Be careful brushing and flossing your teeth or using a toothpick because you may get an infection or bleed more easily. If you have any dental work done, tell your dentist you are receiving this medicine. You may get drowsy or dizzy. Do not drive, use machinery, or do anything that needs mental alertness until you know how this medicine affects you. Do not become pregnant while taking this medicine or for 1 year after stopping it. Women should inform their doctor if they wish to become pregnant or think they might be pregnant. Men should not father a child while taking this medicine and for 4 months after stopping it. There is a potential for serious side effects to an unborn child. Talk to your health care professional or pharmacist for more information. Do not breast-feed an infant while taking this medicine. This medicine may interfere with the ability to have a child. This medicine has caused ovarian failure in some women. This medicine has caused reduced sperm counts in some men. You  should talk with your doctor or health care professional if you are concerned about your fertility. If you are going to have surgery, tell your doctor or health care professional that you have taken this medicine. What side effects may I notice from receiving this medicine? Side effects that you should report to your doctor or health care professional as soon as possible: -allergic reactions like skin rash, itching or hives, swelling of the face, lips, or tongue -low blood counts - this medicine may decrease the number of white blood cells, red blood cells and platelets. You may be at increased risk for infections and bleeding. -signs of infection - fever or chills, cough, sore throat, pain or difficulty passing urine -signs of decreased platelets or bleeding - bruising, pinpoint red spots on the skin, black, tarry stools, blood in the urine -signs of decreased red blood cells - unusually weak or tired, fainting spells, lightheadedness -breathing problems -dark urine -dizziness -palpitations -swelling of the ankles, feet, hands -trouble passing urine or change in the amount of urine -weight gain -yellowing of the eyes or skin Side effects that usually do not require medical attention (report to your doctor or health care professional if they continue or are bothersome): -changes in nail or skin color -hair loss -missed menstrual periods -mouth sores -nausea, vomiting This list may not describe all possible side effects. Call your doctor for medical advice about side effects. You may report side effects to FDA at 1-800-FDA-1088. Where should I keep my medicine? This drug is given in a hospital or clinic and will not be stored at home. NOTE: This sheet is a summary.  It may not cover all possible information. If you have questions about this medicine, talk to your doctor, pharmacist, or health care provider.  2014, [employer]. (2012-03-15 16:22:58)  Doxorubicin injection What  is this medicine? DOXORUBICIN (dox oh ROO bi sin) is a chemotherapy drug. It is used to treat many kinds of cancer like Hodgkin's disease, leukemia, non-Hodgkin's lymphoma, neuroblastoma, sarcoma, and Wilms' tumor. It is also used to treat bladder cancer, breast cancer, lung cancer, ovarian cancer, stomach cancer, and thyroid cancer. This medicine may be used for other purposes; ask your health care provider or pharmacist if you have questions. COMMON BRAND NAME(S): Adriamycin PFS, Adriamycin RDF, Adriamycin, Rubex What should I tell my health care provider before I take this medicine? They need to know if you have any of these conditions: -blood disorders -heart disease, recent heart attack -infection (especially a virus infection such as chickenpox, cold sores, or herpes) -irregular heartbeat -liver disease -recent or ongoing radiation therapy -an unusual or allergic reaction to doxorubicin, other chemotherapy agents, other medicines, foods, dyes, or preservatives -pregnant or trying to get pregnant -breast-feeding How should I use this medicine? This drug is given as an infusion into a vein. It is administered in a hospital or clinic by a specially trained health care professional. If you have pain, swelling, burning or any unusual feeling around the site of your injection, tell your health care professional right away. Talk to your pediatrician regarding the use of this medicine in children. Special care may be needed. Overdosage: If you think you have taken too much of this medicine contact a poison control center or emergency room at once. NOTE: This medicine is only for you. Do not share this medicine with others. What if I miss a dose? It is important not to miss your dose. Call your doctor or health care professional if you are unable to keep an appointment. What may interact with this medicine? Do not take this medicine with any of the following  medications: -cisapride -droperidol -halofantrine -pimozide -zidovudine This medicine may also interact with the following medications: -chloroquine -chlorpromazine -clarithromycin -cyclophosphamide -cyclosporine -erythromycin -medicines for depression, anxiety, or psychotic disturbances -medicines for irregular heart beat like amiodarone, bepridil, dofetilide, encainide, flecainide, propafenone, quinidine -medicines for seizures like ethotoin, fosphenytoin, phenytoin -medicines for nausea, vomiting like dolasetron, ondansetron, palonosetron -medicines to increase blood counts like filgrastim, pegfilgrastim, sargramostim -methadone -methotrexate -pentamidine -progesterone -vaccines -verapamil Talk to your doctor or health care professional before taking any of these medicines: -acetaminophen -aspirin -ibuprofen -ketoprofen -naproxen This list may not describe all possible interactions. Give your health care provider a list of all the medicines, herbs, non-prescription drugs, or dietary supplements you use. Also tell them if you smoke, drink alcohol, or use illegal drugs. Some items may interact with your medicine. What should I watch for while using this medicine? Your condition will be monitored carefully while you are receiving this medicine. You will need important blood work done while you are taking this medicine. This drug may make you feel generally unwell. This is not uncommon, as chemotherapy can affect healthy cells as well as cancer cells. Report any side effects. Continue your course of treatment even though you feel ill unless your doctor tells you to stop. Your urine may turn red for a few days after your dose. This is not blood. If your urine is dark or brown, call your doctor. In some cases, you may be given additional medicines to help with side effects. Follow all directions for  their use. Call your doctor or health care professional for advice if you get a  fever, chills or sore throat, or other symptoms of a cold or flu. Do not treat yourself. This drug decreases your body's ability to fight infections. Try to avoid being around people who are sick. This medicine may increase your risk to bruise or bleed. Call your doctor or health care professional if you notice any unusual bleeding. Be careful brushing and flossing your teeth or using a toothpick because you may get an infection or bleed more easily. If you have any dental work done, tell your dentist you are receiving this medicine. Avoid taking products that contain aspirin, acetaminophen, ibuprofen, naproxen, or ketoprofen unless instructed by your doctor. These medicines may hide a fever. Men and women of childbearing age should use effective birth control methods while using taking this medicine. Do not become pregnant while taking this medicine. There is a potential for serious side effects to an unborn child. Talk to your health care professional or pharmacist for more information. Do not breast-feed an infant while taking this medicine. Do not let others touch your urine or other body fluids for 5 days after each treatment with this medicine. Caregivers should wear latex gloves to avoid touching body fluids during this time. There is a maximum amount of this medicine you should receive throughout your life. The amount depends on the medical condition being treated and your overall health. Your doctor will watch how much of this medicine you receive in your lifetime. Tell your doctor if you have taken this medicine before. What side effects may I notice from receiving this medicine? Side effects that you should report to your doctor or health care professional as soon as possible: -allergic reactions like skin rash, itching or hives, swelling of the face, lips, or tongue -low blood counts - this medicine may decrease the number of white blood cells, red blood cells and platelets. You may be at  increased risk for infections and bleeding. -signs of infection - fever or chills, cough, sore throat, pain or difficulty passing urine -signs of decreased platelets or bleeding - bruising, pinpoint red spots on the skin, black, tarry stools, blood in the urine -signs of decreased red blood cells - unusually weak or tired, fainting spells, lightheadedness -breathing problems -chest pain -fast, irregular heartbeat -mouth sores -nausea, vomiting -pain, swelling, redness at site where injected -pain, tingling, numbness in the hands or feet -swelling of ankles, feet, or hands -unusual bleeding or bruising Side effects that usually do not require medical attention (report to your doctor or health care professional if they continue or are bothersome): -diarrhea -facial flushing -hair loss -loss of appetite -missed menstrual periods -nail discoloration or damage -red or watery eyes -red colored urine -stomach upset This list may not describe all possible side effects. Call your doctor for medical advice about side effects. You may report side effects to FDA at 1-800-FDA-1088. Where should I keep my medicine? This drug is given in a hospital or clinic and will not be stored at home. NOTE: This sheet is a summary. It may not cover all possible information. If you have questions about this medicine, talk to your doctor, pharmacist, or health care provider.  2014, [employer]. (2012-08-27 09:54:34)

## 2013-05-21 ENCOUNTER — Telehealth: Payer: Self-pay | Admitting: Oncology

## 2013-05-21 ENCOUNTER — Ambulatory Visit (HOSPITAL_BASED_OUTPATIENT_CLINIC_OR_DEPARTMENT_OTHER): Payer: BC Managed Care – PPO

## 2013-05-21 ENCOUNTER — Telehealth: Payer: Self-pay | Admitting: *Deleted

## 2013-05-21 VITALS — BP 149/79 | HR 93 | Temp 98.2°F

## 2013-05-21 DIAGNOSIS — C50411 Malignant neoplasm of upper-outer quadrant of right female breast: Secondary | ICD-10-CM

## 2013-05-21 DIAGNOSIS — C50419 Malignant neoplasm of upper-outer quadrant of unspecified female breast: Secondary | ICD-10-CM

## 2013-05-21 DIAGNOSIS — C773 Secondary and unspecified malignant neoplasm of axilla and upper limb lymph nodes: Secondary | ICD-10-CM

## 2013-05-21 DIAGNOSIS — Z5189 Encounter for other specified aftercare: Secondary | ICD-10-CM

## 2013-05-21 DIAGNOSIS — C7800 Secondary malignant neoplasm of unspecified lung: Secondary | ICD-10-CM

## 2013-05-21 DIAGNOSIS — C787 Secondary malignant neoplasm of liver and intrahepatic bile duct: Secondary | ICD-10-CM

## 2013-05-21 MED ORDER — PEGFILGRASTIM INJECTION 6 MG/0.6ML
6.0000 mg | Freq: Once | SUBCUTANEOUS | Status: AC
  Administered 2013-05-21: 6 mg via SUBCUTANEOUS
  Filled 2013-05-21: qty 0.6

## 2013-05-21 NOTE — Telephone Encounter (Signed)
Carrie Berry here for Neulasta injection following 1st AC chemotherapy.  States that she is doing well, only slight nausea which is relieved with the antiemetics.  No vomiting or diarrhea.  Is drinking plenty of fluids and eating small amounts.  All questions answered.  Knows to call if she has any problems or concerns.

## 2013-05-21 NOTE — Patient Instructions (Signed)

## 2013-05-21 NOTE — Telephone Encounter (Signed)
added additional appts 1/21 thru 2/18. s/w pt she is aware. confirmed next appts for 1/13, 1/20 and 1/21. pt will get new schedule when she comes in 1/13

## 2013-05-27 ENCOUNTER — Ambulatory Visit (HOSPITAL_BASED_OUTPATIENT_CLINIC_OR_DEPARTMENT_OTHER): Payer: BC Managed Care – PPO | Admitting: Physician Assistant

## 2013-05-27 ENCOUNTER — Encounter: Payer: Self-pay | Admitting: Physician Assistant

## 2013-05-27 ENCOUNTER — Other Ambulatory Visit (HOSPITAL_BASED_OUTPATIENT_CLINIC_OR_DEPARTMENT_OTHER): Payer: BC Managed Care – PPO

## 2013-05-27 ENCOUNTER — Ambulatory Visit: Payer: BC Managed Care – PPO

## 2013-05-27 VITALS — BP 108/69 | HR 97 | Temp 98.2°F | Resp 20 | Ht 63.0 in | Wt 129.6 lb

## 2013-05-27 DIAGNOSIS — D702 Other drug-induced agranulocytosis: Secondary | ICD-10-CM

## 2013-05-27 DIAGNOSIS — D701 Agranulocytosis secondary to cancer chemotherapy: Secondary | ICD-10-CM | POA: Insufficient documentation

## 2013-05-27 DIAGNOSIS — C50419 Malignant neoplasm of upper-outer quadrant of unspecified female breast: Secondary | ICD-10-CM

## 2013-05-27 DIAGNOSIS — C787 Secondary malignant neoplasm of liver and intrahepatic bile duct: Secondary | ICD-10-CM

## 2013-05-27 DIAGNOSIS — R0981 Nasal congestion: Secondary | ICD-10-CM

## 2013-05-27 DIAGNOSIS — C50411 Malignant neoplasm of upper-outer quadrant of right female breast: Secondary | ICD-10-CM

## 2013-05-27 DIAGNOSIS — C50919 Malignant neoplasm of unspecified site of unspecified female breast: Secondary | ICD-10-CM

## 2013-05-27 DIAGNOSIS — C7800 Secondary malignant neoplasm of unspecified lung: Secondary | ICD-10-CM

## 2013-05-27 DIAGNOSIS — R112 Nausea with vomiting, unspecified: Secondary | ICD-10-CM

## 2013-05-27 DIAGNOSIS — R51 Headache: Secondary | ICD-10-CM

## 2013-05-27 DIAGNOSIS — T451X5A Adverse effect of antineoplastic and immunosuppressive drugs, initial encounter: Secondary | ICD-10-CM

## 2013-05-27 LAB — CBC WITH DIFFERENTIAL/PLATELET
BASO%: 1.3 % (ref 0.0–2.0)
BASOS ABS: 0 10*3/uL (ref 0.0–0.1)
EOS%: 27.7 % — AB (ref 0.0–7.0)
Eosinophils Absolute: 0.6 10*3/uL — ABNORMAL HIGH (ref 0.0–0.5)
HEMATOCRIT: 37.8 % (ref 34.8–46.6)
HEMOGLOBIN: 12.4 g/dL (ref 11.6–15.9)
LYMPH%: 61.8 % — AB (ref 14.0–49.7)
MCH: 29.8 pg (ref 25.1–34.0)
MCHC: 32.8 g/dL (ref 31.5–36.0)
MCV: 91 fL (ref 79.5–101.0)
MONO#: 0.1 10*3/uL (ref 0.1–0.9)
MONO%: 4.8 % (ref 0.0–14.0)
NEUT#: 0.1 10*3/uL — CL (ref 1.5–6.5)
NEUT%: 4.4 % — ABNORMAL LOW (ref 38.4–76.8)
PLATELETS: 151 10*3/uL (ref 145–400)
RBC: 4.16 10*6/uL (ref 3.70–5.45)
RDW: 14.6 % — ABNORMAL HIGH (ref 11.2–14.5)
WBC: 2.3 10*3/uL — ABNORMAL LOW (ref 3.9–10.3)
lymph#: 1.4 10*3/uL (ref 0.9–3.3)

## 2013-05-27 LAB — COMPREHENSIVE METABOLIC PANEL (CC13)
ALT: 23 U/L (ref 0–55)
AST: 18 U/L (ref 5–34)
Albumin: 4.1 g/dL (ref 3.5–5.0)
Alkaline Phosphatase: 125 U/L (ref 40–150)
Anion Gap: 13 mEq/L — ABNORMAL HIGH (ref 3–11)
BILIRUBIN TOTAL: 0.73 mg/dL (ref 0.20–1.20)
BUN: 25.9 mg/dL (ref 7.0–26.0)
CO2: 26 mEq/L (ref 22–29)
Calcium: 9.5 mg/dL (ref 8.4–10.4)
Chloride: 98 mEq/L (ref 98–109)
Creatinine: 0.8 mg/dL (ref 0.6–1.1)
Glucose: 91 mg/dl (ref 70–140)
Potassium: 3.4 mEq/L — ABNORMAL LOW (ref 3.5–5.1)
Sodium: 138 mEq/L (ref 136–145)
Total Protein: 7.3 g/dL (ref 6.4–8.3)

## 2013-05-27 MED ORDER — CIPROFLOXACIN HCL 500 MG PO TABS: 500.0000 mg | ORAL_TABLET | Freq: Two times a day (BID) | ORAL | Status: DC

## 2013-05-27 NOTE — Progress Notes (Signed)
Patient ID: Carrie Berry, female   DOB: 1951-02-25, 63 y.o.   MRN: [id number] ID: Carrie Berry OB: [birth year]  MR#: [id number]  CSN#:[id number]  PCP: Carrie Hams, MD GYN:   SU: Carrie Berry OTHER MD: Carrie Berry, Carrie Berry, Carrie Berry, Carrie Berry  CHIEF COMPLAINT:  Metastatic Breast Cancer   HISTORY OF PRESENT ILLNESS: Carrie Berry noted a mass in her right breast December of 2013, but did not think much of it. It did grow some lower the summer, but she was keeping her grandchild at that time and was too busy so she did not bring it to Dr. Bernell Berry attention until August. He set her up for bilateral mammography and ultrasonography at [facility] 12/18/2012 and this measured, on the right, a large irregular lobulated mass in the upper outer quadrant which by ultrasound measured 5.7 cm. The right axilla showed some lymph nodes with thickened cortices. In the left breast there was a focal asymmetry at the depth, lateral to the nipple, and by ultrasound there was a 9 mm ill-defined hypoechoic lesion in this location. Biopsy of the left breast lesion showed only fibrocystic changes.  Biopsy of the right breast mass and right axillary adenopathy ([id number]) on 12/23/2012 showed both to be involved by invasive ductal carcinoma, grade 2, triple negative, with an MIB-1 of 86%.  MRI of the breast 12/27/2012 at [facility] imaging showed in the right breast a mass abutting the pectoralis muscle without enhancement of the muscle measuring 4.8 cm. The satellite nodule measuring approximately 3 mm was also noted superior to the mass and several abnormal and enlarged right axillary lymph nodes were noted, one of which appeared to be necrotic. The largest node measured 2.0 cm. Unfortunately, numerous bilateral pulmonary nodules were also noted.  The patient's subsequent history is as detailed below   INTERVAL HISTORY: Carrie Berry returns alone today for followup of her metastatic breast cancer and assessment of  chemotoxicity.  She is currently day 8 cycle 1  of 4 planned cycles of dose dense doxorubicin/ cyclophosphamide, with Neulasta support given on day 2.   Carrie Berry tolerated treatment well last week and tells me it was "not too bad". She's had only some mild intermittent nausea, and one isolated episode of emesis after she took a hot shower and felt "overheated". Her biggest complaint today is a dull headache.  I will mention that she is intolerant of ondansetron due to to severe headaches, and she does in fact received Aloxi with her premeds. She's had no dizziness or change in vision. She continues to have some sinus congestion and postnasal drip. She denies any fevers, chills, or night sweats.  The pain in Carrie Berry's right breast has already improved somewhat over the past week, and she feels like the mass is already "softer".  In fact, she tells me she took no pain medication yesterday.   REVIEW OF SYSTEMS:   Carrie Berry's energy level is fair. She denies any rashes or skin changes. She's had no signs of abnormal bruising or bleeding. Her appetite is fair. She is having regular bowel movements. She's had no change in urinary habits. She denies any increased cough, phlegm production, shortness of breath, chest pain, or palpitations. She's had no peripheral swelling. She currently denies any unusual myalgias, arthralgias, or bony pain.   A detailed review of systems is otherwise stable and noncontributory.    PAST MEDICAL HISTORY: Past Medical History  Diagnosis Date  . Recurrent sinus infections   . Arthritis   . PONV (  postoperative nausea and vomiting)   . Anxiety   . Cancer     breast  . Breast cancer   . GERD (gastroesophageal reflux disease)     PAST SURGICAL HISTORY: Past Surgical History  Procedure Laterality Date  . Tubal ligation    . Tubal ligation  1985  . Portacath placement N/A 01/10/2013    Procedure: ULTRASOUND GUIDED PORT-A-CATH INSERTION WITH FLUOROSCOPY;  Surgeon: Carrie Hollingshead,  MD;  Location: [place];  Service: General;  Laterality: N/A;    FAMILY HISTORY Family History  Problem Relation Age of Onset  . Bladder Cancer Father   . Colon cancer Maternal Grandmother    the patient's parents are living, both in their 27s. The patient's father was diagnosed with bladder cancer the age of 43. The patient's mother's mother had some type of gastrointestinal cancer. The patient had one brother, no sisters. There is no history of breast or ovarian cancer in the family other than a cousin on the father's side who was diagnosed with breast cancer apparently before the age of 36.  GYNECOLOGIC HISTORY:  Menarche age 32, first live birth age 62, the patient is Carrie Berry P2. She went through menopause in 2008. She did not take hormone replacement. She took birth control for approximately 22 years remotely.  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 She's currently on disability. Her husband Carrie Berry"), is vice Development worker, international aid for [employer]. The patient's daughter Carrie Berry is a stay-at-home mom in [place], the patient's son Carrie Berry unfortunately died in an automobile accident in December of 2011. The patient has 2 grandchildren. She attends to a local [employer].    ADVANCED DIRECTIVES: Not in place   HEALTH MAINTENANCE: (Updated December 2014) History  Substance Use Topics  . Smoking status: Never Smoker   . Smokeless tobacco: Never Used  . Alcohol Use: No     Colonoscopy: 2005  PAP: Not on file  Bone density: Never  Lipid panel:  Dr. Delfina Redwood   Allergies  Allergen Reactions  . Ondansetron Hcl Other (See Comments)    HEADACHE  . Codeine Nausea And Vomiting    "violently ill"  . Penicillins Rash    Childhood reaction -    Current Outpatient Prescriptions  Medication Sig Dispense Refill  . acetaminophen (TYLENOL) 325 MG tablet  Take 650 mg by mouth every 8 (eight) hours as needed for pain.      Marland Kitchen dexamethasone (DECADRON) 4 MG tablet Take 2 tablets by mouth once a day on the day after chemotherapy and then take 2 tablets two times a day for 2 days. Take with food.  30 tablet  1  . lidocaine-prilocaine (EMLA) cream Apply topically as needed.  30 g  3  . loratadine-pseudoephedrine (CLARITIN-D 24-HOUR) 10-240 MG per 24 hr tablet Take 1 tablet by mouth daily.      Marland Kitchen LORazepam (ATIVAN) 0.5 MG tablet Take 1 tablet (0.5 mg total) by mouth every 6 (six) hours as needed (Nausea or vomiting).  30 tablet  0  . LORazepam (ATIVAN) 1 MG tablet Take 0.5 mg by mouth every 6 (six) hours as needed for anxiety.       . Multiple Vitamin (MULTIVITAMIN WITH MINERALS) TABS tablet Take 1 tablet by mouth daily.      Marland Kitchen omeprazole (PRILOSEC) 20 MG capsule Take 1 capsule (20 mg total) by mouth 2 (two) times daily.  60 capsule  3  . PATADAY 0.2 % SOLN Place 1 drop into both eyes daily as needed (dry eyes).       Marland Kitchen tobramycin-dexamethasone (TOBRADEX) ophthalmic solution Place 1 drop into both eyes daily.  5 mL  1  . traZODone (DESYREL) 50 MG tablet Take 1-2 tablets (50-100 mg total) by mouth at bedtime. For sleep  60 tablet  0  . triamcinolone (NASACORT) 55 MCG/ACT nasal inhaler Place 2 sprays into the nose daily.      Marland Kitchen zolpidem (AMBIEN) 5 MG tablet Take 1 tablet (5 mg total) by mouth at bedtime as needed for sleep.  20 tablet  0  . ciprofloxacin (CIPRO) 500 MG tablet Take 1 tablet (500 mg total) by mouth 2 (two) times daily. X 7 days  14 tablet  3  . prochlorperazine (COMPAZINE) 10 MG tablet Take 1 tablet (10 mg total) by mouth every 6 (six) hours as needed for nausea.  30 tablet  2  . promethazine (PHENERGAN) 25 MG tablet Take 1 tablet (25 mg total) by mouth every 6 (six) hours as needed for nausea or vomiting.  30 tablet  1  . traMADol (ULTRAM) 50 MG tablet Take 1 tablet (50 mg total) by mouth every 6 (six) hours as needed for moderate pain.  60  tablet  0   No current facility-administered medications for this visit.    OBJECTIVE: Middle-aged white woman in no acute distress Filed Vitals:   05/27/13 1038  BP: 108/69  Pulse: 97  Temp: 98.2 F (36.8 C)  Resp: 20     Body mass index is 22.96 kg/(m^2).    ECOG FS: 1 Filed Weights   05/27/13 1038  Weight: 129 lb 9.6 oz (58.786 kg)   Physical Exam: HEENT:  Sclerae anicteric.  Oropharynx clear and moist. No ulcerations, and no evidence of oropharyngeal candidiasis. Nasal mucosa is mildly erythematous and edematous bilaterally. Neck is supple.  NODES:  No cervical or supraclavicular lymphadenopathy palpated.  BREAST EXAM: There is still a large mass in the lateral portion of the right breast. The palpable nodules in the lateral portion of the breast do appear somewhat softer with palpation. There is also no tenderness to palpation today. There is a palpable nodule in the right axilla.  LUNGS:  Clear to auscultation bilaterally.  No wheezes or rhonchi HEART:  Regular rate and rhythm. No murmur appreciated ABDOMEN:  Soft, nontender. No hepatomegaly palpated.  Positive bowel sounds.  MSK:  No focal spinal tenderness to palpation. Good range of motion bilaterally in the upper extremities. EXTREMITIES:  No peripheral edema.   SKIN:  Benign with no visible rashes or skin lesions. Port is intact in the chest wall with no erythema or edema, and no evidence of infection/cellulitis. NEURO:  Nonfocal. Well oriented.  Appropriate affect.   LAB RESULTS:   Lab Results  Component Value Date   WBC 2.3* 05/27/2013   NEUTROABS 0.1* 05/27/2013   HGB 12.4 05/27/2013   HCT 37.8 05/27/2013   MCV 91.0 05/27/2013   PLT 151 05/27/2013      Chemistry      Component Value Date/Time   NA 138 05/27/2013 1025   NA 136 02/28/2013 2230   K 3.4* 05/27/2013 1025   K 3.4* 02/28/2013 2230   CL 99 02/28/2013 2230   CO2 26 05/27/2013 1025   CO2 28 02/28/2013 2230   BUN 25.9 05/27/2013 1025   BUN 8 02/28/2013  2230   CREATININE 0.8 05/27/2013 1025  CREATININE 0.59 02/28/2013 2230      Component Value Date/Time   CALCIUM 9.5 05/27/2013 1025   CALCIUM 9.0 02/28/2013 2230   ALKPHOS 125 05/27/2013 1025   ALKPHOS 102 02/28/2013 2230   AST 18 05/27/2013 1025   AST 19 02/28/2013 2230   ALT 23 05/27/2013 1025   ALT 18 02/28/2013 2230   BILITOT 0.73 05/27/2013 1025   BILITOT 0.3 02/28/2013 2230        STUDIES:  Ct Chest W Contrast  05/16/2013   CLINICAL DATA:  Followup of breast cancer. Diagnosed 8/13. Chemotherapy ongoing.  EXAM: CT CHEST WITH CONTRAST  TECHNIQUE: Multidetector CT imaging of the chest was performed during intravenous contrast administration.  CONTRAST:  10mL OMNIPAQUE IOHEXOL 300 MG/ML  SOLN  COMPARISON:  03/17/2013  FINDINGS: Lungs/Pleura: Mild progression of pulmonary metastasis. Index right lower lobe nodule measures 6 mm on image 29 today versus 5 mm on the prior.  Posterior left upper lobe 8 mm nodule on image 20/series 5 is increased from 6 mm on the prior.  Index right lower lobe nodule measures 12 mm on image 25 versus 9 mm on the prior.  No pleural fluid.  Heart/Mediastinum: A right-sided Port-A-Cath which terminates at the mid SVC.  Right axillary node which measures 1.5 cm on image 23 versus 1.6 cm on the prior.  Right breast mass which measures 5.6 x 7.8 cm on image 38 and has enlarged from 4.7 x 4.7 cm on the prior.  No left axillary adenopathy. Normal heart size, without pericardial effusion. No mediastinal or hilar adenopathy.  Upper Abdomen: Progressive hepatic metastasis. Index posterior segment right hepatic lobe 2.1 cm lesion on image 59 measured 1.7 cm on the prior.  A medial segment left liver lobe 1.7 cm lesion on image 64 measured 1.4 cm on the prior.  Normal adrenal glands.  Bones/Musculoskeletal:  No acute osseous abnormality.  IMPRESSION: 1. Progressive metastatic disease, including within the liver and lungs. 2. Primary breast mass is also enlarged since the prior. 3.  Right axillary adenopathy is similar to minimally improved.   Electronically Signed   By: Jeronimo Greaves M.D.   On: 05/16/2013 09:21     Transthoracic Echocardiography  Patient: Al[place], [place] MR #: [id number] Study Date: 05/02/2013 Gender: F Age: 57 Height: 162.6cm Weight: 60.8kg BSA: 1.34m^2 Pt. Status: Room:  PERFORMING [place], [facility] REFERRING Polite, Darnelle Bos, Eathon Valade SONOGRAPHER Georgian Co, RDCS, CCT cc:  ------------------------------------------------------------ LV EF: 55% - 60%  ------------------------------------------------------------ History: PMH: Breast Cancer   ASSESSMENT: 63 y.o. Carrie Berry, [place] woman status post right breast upper outer quadrant and right axillary lymph node biopsy 12/23/2012 for a clinical T3 N1 M1, stage IV invasive ductal carcinoma, grade 3, triple negative, with an MIB-1 of 86%.  (1) right liver lobe biopsy 01/21/2013 confirms metastatic adenocarcinoma; scans show involvement of the liver, lungs, and likely bone.  (2) enrolled in [facility] study [id number] (docetaxel + oral gamma secretase inibitor [id number]), received one cycle starting 02/04/2013  but withdrew because of poor tolerance despite treatment interruptions and decreased dosing of the oral component  (3)  chest CT in early November 2014 was compared with studies in August 2014 and showed interval progression of metastatic breast cancer, with an enlarging primary right breast mass, enlarging right axillary lymph nodes, enlarging pulmonary nodules and new/enlarging hepatic metastases.  (4) Abraxane started 03/19/2013, given day 1 and day 8 of each 21 day cycle, stopped with the day 1 cycle 3 dose (04/29/2013) because of  local progression of disease  (5) cyclophosphamide and doxorubicin started 05/20/2013, given in dose dense fashion  with Neulasta support on day 2.  The plan is to complete 4 cycles, to be followed by definitive local treatment.   PLAN: Chyra seems to  have tolerated this first cycle of doxorubicin/cyclophosphamide well, and I'm making no changes to her current regimen. Her ANC is low today at 0.1, and we reviewed neutropenic precautions. She will be started on Cipro prophylactically, 500 mg by mouth twice a day for 7 days. She also knows to contact us with any fevers of 100 or above for further evaluation.   I think Demeisha's headaches could be associated with her anti-emetics. I suggested she try taking a low dose of Benadryl to see if this helps with the headache. Of course she knows to contact us if the headaches worsened.   Otherwise, she is scheduled to return next week on January 20 for repeat labs and physical exam in anticipation of day 1 cycle 2. She voices her understanding and agreement with our plan. She understands that the goal of treatment is to control her disease. She will call with any problems that may develop before her next visit here.     Duglas Heier, PA-C   05/27/2013 12:26 PM

## 2013-05-28 ENCOUNTER — Encounter: Payer: Self-pay | Admitting: Oncology

## 2013-06-02 ENCOUNTER — Other Ambulatory Visit: Payer: Self-pay | Admitting: Physician Assistant

## 2013-06-02 ENCOUNTER — Ambulatory Visit (HOSPITAL_COMMUNITY): Payer: BC Managed Care – PPO

## 2013-06-02 DIAGNOSIS — G4700 Insomnia, unspecified: Secondary | ICD-10-CM

## 2013-06-03 ENCOUNTER — Ambulatory Visit (HOSPITAL_BASED_OUTPATIENT_CLINIC_OR_DEPARTMENT_OTHER): Payer: BC Managed Care – PPO | Admitting: Hematology and Oncology

## 2013-06-03 ENCOUNTER — Ambulatory Visit (HOSPITAL_BASED_OUTPATIENT_CLINIC_OR_DEPARTMENT_OTHER): Payer: BC Managed Care – PPO

## 2013-06-03 ENCOUNTER — Other Ambulatory Visit (HOSPITAL_BASED_OUTPATIENT_CLINIC_OR_DEPARTMENT_OTHER): Payer: BC Managed Care – PPO

## 2013-06-03 VITALS — BP 106/66 | HR 86 | Temp 98.1°F | Resp 18 | Ht 63.0 in | Wt 129.9 lb

## 2013-06-03 DIAGNOSIS — C7800 Secondary malignant neoplasm of unspecified lung: Secondary | ICD-10-CM

## 2013-06-03 DIAGNOSIS — C773 Secondary and unspecified malignant neoplasm of axilla and upper limb lymph nodes: Secondary | ICD-10-CM

## 2013-06-03 DIAGNOSIS — C787 Secondary malignant neoplasm of liver and intrahepatic bile duct: Secondary | ICD-10-CM

## 2013-06-03 DIAGNOSIS — Z171 Estrogen receptor negative status [ER-]: Secondary | ICD-10-CM

## 2013-06-03 DIAGNOSIS — C50419 Malignant neoplasm of upper-outer quadrant of unspecified female breast: Secondary | ICD-10-CM

## 2013-06-03 DIAGNOSIS — C50411 Malignant neoplasm of upper-outer quadrant of right female breast: Secondary | ICD-10-CM

## 2013-06-03 DIAGNOSIS — Z5111 Encounter for antineoplastic chemotherapy: Secondary | ICD-10-CM

## 2013-06-03 DIAGNOSIS — G4700 Insomnia, unspecified: Secondary | ICD-10-CM

## 2013-06-03 LAB — CBC WITH DIFFERENTIAL/PLATELET
BASO%: 0.8 % (ref 0.0–2.0)
BASOS ABS: 0.1 10*3/uL (ref 0.0–0.1)
EOS ABS: 0.1 10*3/uL (ref 0.0–0.5)
EOS%: 1.1 % (ref 0.0–7.0)
HEMATOCRIT: 35.4 % (ref 34.8–46.6)
HEMOGLOBIN: 11.8 g/dL (ref 11.6–15.9)
LYMPH#: 1.8 10*3/uL (ref 0.9–3.3)
LYMPH%: 14.7 % (ref 14.0–49.7)
MCH: 29.6 pg (ref 25.1–34.0)
MCHC: 33.3 g/dL (ref 31.5–36.0)
MCV: 88.9 fL (ref 79.5–101.0)
MONO#: 0.9 10*3/uL (ref 0.1–0.9)
MONO%: 7.4 % (ref 0.0–14.0)
NEUT#: 9.2 10*3/uL — ABNORMAL HIGH (ref 1.5–6.5)
NEUT%: 76 % (ref 38.4–76.8)
Platelets: 306 10*3/uL (ref 145–400)
RBC: 3.98 10*6/uL (ref 3.70–5.45)
RDW: 13.9 % (ref 11.2–14.5)
WBC: 12.1 10*3/uL — AB (ref 3.9–10.3)

## 2013-06-03 MED ORDER — HEPARIN SOD (PORK) LOCK FLUSH 100 UNIT/ML IV SOLN
500.0000 [IU] | Freq: Once | INTRAVENOUS | Status: AC | PRN
  Administered 2013-06-03: 500 [IU]
  Filled 2013-06-03: qty 5

## 2013-06-03 MED ORDER — DEXAMETHASONE SODIUM PHOSPHATE 20 MG/5ML IJ SOLN
12.0000 mg | Freq: Once | INTRAMUSCULAR | Status: AC
  Administered 2013-06-03: 12 mg via INTRAVENOUS

## 2013-06-03 MED ORDER — SODIUM CHLORIDE 0.9 % IV SOLN
600.0000 mg/m2 | Freq: Once | INTRAVENOUS | Status: AC
  Administered 2013-06-03: 980 mg via INTRAVENOUS
  Filled 2013-06-03: qty 49

## 2013-06-03 MED ORDER — PALONOSETRON HCL INJECTION 0.25 MG/5ML
INTRAVENOUS | Status: AC
  Filled 2013-06-03: qty 5

## 2013-06-03 MED ORDER — SODIUM CHLORIDE 0.9 % IV SOLN
Freq: Once | INTRAVENOUS | Status: AC
  Administered 2013-06-03: 13:00:00 via INTRAVENOUS

## 2013-06-03 MED ORDER — DEXAMETHASONE SODIUM PHOSPHATE 20 MG/5ML IJ SOLN
INTRAMUSCULAR | Status: AC
  Filled 2013-06-03: qty 5

## 2013-06-03 MED ORDER — PALONOSETRON HCL INJECTION 0.25 MG/5ML
0.2500 mg | Freq: Once | INTRAVENOUS | Status: AC
  Administered 2013-06-03: 0.25 mg via INTRAVENOUS

## 2013-06-03 MED ORDER — ZOLPIDEM TARTRATE 5 MG PO TABS: 5.0000 mg | ORAL_TABLET | Freq: Every evening | ORAL | Status: DC | PRN

## 2013-06-03 MED ORDER — SODIUM CHLORIDE 0.9 % IV SOLN
150.0000 mg | Freq: Once | INTRAVENOUS | Status: AC
  Administered 2013-06-03: 150 mg via INTRAVENOUS
  Filled 2013-06-03: qty 5

## 2013-06-03 MED ORDER — DOXORUBICIN HCL CHEMO IV INJECTION 2 MG/ML
60.0000 mg/m2 | Freq: Once | INTRAVENOUS | Status: AC
  Administered 2013-06-03: 98 mg via INTRAVENOUS
  Filled 2013-06-03: qty 49

## 2013-06-03 MED ORDER — SODIUM CHLORIDE 0.9 % IJ SOLN
10.0000 mL | INTRAMUSCULAR | Status: DC | PRN
  Administered 2013-06-03: 10 mL
  Filled 2013-06-03: qty 10

## 2013-06-03 MED ORDER — ALTEPLASE 2 MG IJ SOLR
2.0000 mg | Freq: Once | INTRAMUSCULAR | Status: DC | PRN
  Filled 2013-06-03: qty 2

## 2013-06-03 NOTE — Progress Notes (Signed)
To the Patient ID: EXIE HEIKKILA, female   DOB: Apr 04, 1951, 63 y.o.   MRN: [id number] ID: [name] OB: 05-Jul-1950  MR#: [id number]  CSN#:[id number]  PCP: [name], MD GYN:   SU: [name] OTHER MD: Thea Silversmith, Earle Gell, Rick Cornella, Erline Levine  CHIEF COMPLAINT:  Metastatic Breast Cancer   HISTORY OF PRESENT ILLNESS: Amberly noted a mass in her right breast December of 2013, but did not think much of it. It did grow some lower the summer, but she was keeping her grandchild at that time and was too busy so she did not bring it to Dr. Bernell List attention until August. He set her up for bilateral mammography and ultrasonography at [facility] 12/18/2012 and this measured, on the right, a large irregular lobulated mass in the upper outer quadrant which by ultrasound measured 5.7 cm. The right axilla showed some lymph nodes with thickened cortices. In the left breast there was a focal asymmetry at the depth, lateral to the nipple, and by ultrasound there was a 9 mm ill-defined hypoechoic lesion in this location. Biopsy of the left breast lesion showed only fibrocystic changes.  Biopsy of the right breast mass and right axillary adenopathy ([id number]) on 12/23/2012 showed both to be involved by invasive ductal carcinoma, grade 2, triple negative, with an MIB-1 of 86%.  MRI of the breast 12/27/2012 at [facility] imaging showed in the right breast a mass abutting the pectoralis muscle without enhancement of the muscle measuring 4.8 cm. The satellite nodule measuring approximately 3 mm was also noted superior to the mass and several abnormal and enlarged right axillary lymph nodes were noted, one of which appeared to be necrotic. The largest node measured 2.0 cm. Unfortunately, numerous bilateral pulmonary nodules were also noted.  The patient's subsequent history is as detailed below   INTERVAL HISTORY: Ms. Daigneault is here for followup  visit and for initiation of second cycle of  chemotherapy with dose dense AC. She tolerated the first cycle of chemotherapy well with minimal nausea. She also says that she developed mouth ulcer and it is resolved with mouth ulcer and it is resolved with baking soda and salt and also listerine gurgling.  She denies any fever shortness of breath, chest pains, palpitations. She denies any headache, blurred vision, constipation, blood in the stool blood in the urine.  She says that her right breast pain is improving when compared to before and also she noticed a slight decrease in the size of the right breast mass.   REVIEW OF SYSTEMS:   All 14 review of systems is been assessed and the pertinent symptoms are listed in interval history    PAST MEDICAL HISTORY: Past Medical History  Diagnosis Date  . Recurrent sinus infections   . Arthritis   . PONV (postoperative nausea and vomiting)   . Anxiety   . Cancer     breast  . Breast cancer   . GERD (gastroesophageal reflux disease)     PAST SURGICAL HISTORY: Past Surgical History  Procedure Laterality Date  . Tubal ligation    . Tubal ligation  1985  . Portacath placement N/A 01/10/2013    Procedure: ULTRASOUND GUIDED PORT-A-CATH INSERTION WITH FLUOROSCOPY;  Surgeon: [name], MD;  Location: [place];  Service: General;  Laterality: N/A;    FAMILY HISTORY Family History  Problem Relation Age of Onset  . Bladder Cancer Father   . Colon cancer Maternal Grandmother    the patient's parents are living,  both in their 39s. The patient's father was diagnosed with bladder cancer the age of 7. The patient's mother's mother had some type of gastrointestinal cancer. The patient had one brother, no sisters. There is no history of breast or ovarian cancer in the family other than a cousin on the father's side who was diagnosed with breast cancer apparently before the age of 68.  GYNECOLOGIC HISTORY:  Menarche age 72, first live birth age 44, the patient is [place] P2. She went through  menopause in 2008. She did not take hormone replacement. She took birth control for approximately 22 years remotely.  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 She's currently on disability. Her husband Bianco Cange [name]"), is vice Development worker, international aid for [employer]. The patient's daughter [name] is a stay-at-home mom in [place], the patient's son Carmelia Tiner unfortunately died in an automobile accident in December of 2011. The patient has 2 grandchildren. She attends to a local [employer].    ADVANCED DIRECTIVES: Not in place   HEALTH MAINTENANCE: (Updated December 2014) History  Substance Use Topics  . Smoking status: Never Smoker   . Smokeless tobacco: Never Used  . Alcohol Use: No     Colonoscopy: 2005  PAP: Not on file  Bone density: Never  Lipid panel:  Dr. Delfina Redwood   Allergies  Allergen Reactions  . Ondansetron Hcl Other (See Comments)    HEADACHE  . Codeine Nausea And Vomiting    "violently ill"  . Penicillins Rash    Childhood reaction -    Current Outpatient Prescriptions  Medication Sig Dispense Refill  . acetaminophen (TYLENOL) 325 MG tablet Take 650 mg by mouth every 8 (eight) hours as needed for pain.      . ciprofloxacin (CIPRO) 500 MG tablet Take 1 tablet (500 mg total) by mouth 2 (two) times daily. X 7 days  14 tablet  3  . dexamethasone (DECADRON) 4 MG tablet Take 2 tablets by mouth once a day on the day after chemotherapy and then take 2 tablets two times a day for 2 days. Take with food.  30 tablet  1  . lidocaine-prilocaine (EMLA) cream Apply topically as needed.  30 g  3  . loratadine-pseudoephedrine (CLARITIN-D 24-HOUR) 10-240 MG per 24 hr tablet Take 1 tablet by mouth daily.      Marland Kitchen LORazepam (ATIVAN) 0.5 MG tablet Take 1 tablet (0.5 mg total) by mouth every 6 (six) hours as needed (Nausea or vomiting).  30 tablet  0  .  LORazepam (ATIVAN) 1 MG tablet Take 0.5 mg by mouth every 6 (six) hours as needed for anxiety.       . Multiple Vitamin (MULTIVITAMIN WITH MINERALS) TABS tablet Take 1 tablet by mouth daily.      Marland Kitchen omeprazole (PRILOSEC) 20 MG capsule Take 1 capsule (20 mg total) by mouth 2 (two) times daily.  60 capsule  3  . ondansetron (ZOFRAN) 8 MG tablet       . PATADAY 0.2 % SOLN Place 1 drop into both eyes daily as needed (dry eyes).       . prochlorperazine (COMPAZINE) 10 MG tablet Take 1 tablet (10 mg total) by mouth every 6 (six) hours as needed for nausea.  30 tablet  2  . promethazine (PHENERGAN) 25 MG tablet Take 1 tablet (25 mg total) by mouth every 6 (six) hours as needed for nausea or vomiting.  30 tablet  1  . tobramycin-dexamethasone (TOBRADEX) ophthalmic solution Place 1 drop into both eyes daily.  5 mL  1  . traMADol (ULTRAM) 50 MG tablet Take 1 tablet (50 mg total) by mouth every 6 (six) hours as needed for moderate pain.  60 tablet  0  . traZODone (DESYREL) 50 MG tablet Take 1-2 tablets (50-100 mg total) by mouth at bedtime. For sleep  60 tablet  0  . triamcinolone (NASACORT) 55 MCG/ACT nasal inhaler Place 2 sprays into the nose daily.      Marland Kitchen zolpidem (AMBIEN) 5 MG tablet Take 1 tablet (5 mg total) by mouth at bedtime as needed for sleep.  20 tablet  0   No current facility-administered medications for this visit.   Facility-Administered Medications Ordered in Other Visits  Medication Dose Route Frequency Provider Last Rate Last Dose  . alteplase (CATHFLO ACTIVASE) injection 2 mg  2 mg Intracatheter Once PRN Amy Milda Smart, PA-C      . cyclophosphamide (CYTOXAN) 980 mg in sodium chloride 0.9 % 250 mL chemo infusion  600 mg/m2 (Treatment Plan Actual) Intravenous Once Amy G Berry, PA-C      . heparin lock flush 100 unit/mL  500 Units Intracatheter Once PRN Amy Milda Smart, PA-C      . sodium chloride 0.9 % injection 10 mL  10 mL Intracatheter PRN Amy Milda Smart, PA-C        OBJECTIVE: Middle-aged  white woman in no acute distress Filed Vitals:   06/03/13 1121  BP: 106/66  Pulse: 86  Temp: 98.1 F (36.7 C)  Resp: 18     Body mass index is 23.02 kg/(m^2).    ECOG FS: 1 Filed Weights   06/03/13 1121  Weight: 129 lb 14.4 oz (58.922 kg)   Physical Exam: HEENT:  Sclerae anicteric.  Oropharynx clear and moist. No ulcerations, and no evidence of oropharyngeal candidiasis. Nasal mucosa is mildly erythematous and edematous bilaterally. Neck is supple.  NODES:  No cervical or supraclavicular lymphadenopathy palpated.  BREAST EXAM: There is still a large mass in the lateral portion of the right breast. The palpable nodules in the lateral portion of the breast do appear somewhat softer with palpation. There is also no tenderness to palpation today. There is a palpable nodule in the right axilla.  LUNGS:  Clear to auscultation bilaterally.  No wheezes or rhonchi HEART:  Regular rate and rhythm. No murmur appreciated ABDOMEN:  Soft, nontender. No hepatomegaly palpated.  Positive bowel sounds.  MSK:  No focal spinal tenderness to palpation. Good range of motion bilaterally in the upper extremities. EXTREMITIES:  No peripheral edema.   SKIN:  Benign with no visible rashes or skin lesions. Port is intact in the chest wall with no erythema or edema, and no evidence of infection/cellulitis. NEURO:  Nonfocal. Well oriented.  Appropriate affect.   LAB RESULTS:   Lab Results  Component Value Date   WBC 12.1* 06/03/2013   NEUTROABS 9.2* 06/03/2013   HGB 11.8 06/03/2013   HCT 35.4 06/03/2013   MCV 88.9 06/03/2013   PLT 306 06/03/2013      Chemistry      Component Value Date/Time   NA 138 05/27/2013 1025   NA 136 02/28/2013 2230   K 3.4* 05/27/2013 1025   K 3.4* 02/28/2013 2230   CL 99 02/28/2013 2230   CO2 26 05/27/2013 1025   CO2 28 02/28/2013 2230   BUN 25.9 05/27/2013 1025   BUN 8 02/28/2013 2230  CREATININE 0.8 05/27/2013 1025   CREATININE 0.59 02/28/2013 2230      Component Value  Date/Time   CALCIUM 9.5 05/27/2013 1025   CALCIUM 9.0 02/28/2013 2230   ALKPHOS 125 05/27/2013 1025   ALKPHOS 102 02/28/2013 2230   AST 18 05/27/2013 1025   AST 19 02/28/2013 2230   ALT 23 05/27/2013 1025   ALT 18 02/28/2013 2230   BILITOT 0.73 05/27/2013 1025   BILITOT 0.3 02/28/2013 2230        STUDIES:  Ct Chest W Contrast  05/16/2013   CLINICAL DATA:  Followup of breast cancer. Diagnosed 8/13. Chemotherapy ongoing.  EXAM: CT CHEST WITH CONTRAST  TECHNIQUE: Multidetector CT imaging of the chest was performed during intravenous contrast administration.  CONTRAST:  41mL OMNIPAQUE IOHEXOL 300 MG/ML  SOLN  COMPARISON:  03/17/2013  FINDINGS: Lungs/Pleura: Mild progression of pulmonary metastasis. Index right lower lobe nodule measures 6 mm on image 29 today versus 5 mm on the prior.  Posterior left upper lobe 8 mm nodule on image 20/series 5 is increased from 6 mm on the prior.  Index right lower lobe nodule measures 12 mm on image 25 versus 9 mm on the prior.  No pleural fluid.  Heart/Mediastinum: A right-sided Port-A-Cath which terminates at the mid SVC.  Right axillary node which measures 1.5 cm on image 23 versus 1.6 cm on the prior.  Right breast mass which measures 5.6 x 7.8 cm on image 38 and has enlarged from 4.7 x 4.7 cm on the prior.  No left axillary adenopathy. Normal heart size, without pericardial effusion. No mediastinal or hilar adenopathy.  Upper Abdomen: Progressive hepatic metastasis. Index posterior segment right hepatic lobe 2.1 cm lesion on image 59 measured 1.7 cm on the prior.  A medial segment left liver lobe 1.7 cm lesion on image 64 measured 1.4 cm on the prior.  Normal adrenal glands.  Bones/Musculoskeletal:  No acute osseous abnormality.  IMPRESSION: 1. Progressive metastatic disease, including within the liver and lungs. 2. Primary breast mass is also enlarged since the prior. 3. Right axillary adenopathy is similar to minimally improved.   Electronically Signed   By: Abigail Miyamoto M.D.   On: 05/16/2013 09:21     Transthoracic Echocardiography  Patient: Kesha, Miska MR #: [id number] Study Date: 05/02/2013 Gender: F Age: 77 Height: 162.6cm Weight: 60.8kg BSA: 1.79m^2 Pt. Status: Room:  PERFORMING [place], [place] Ravens, Amy SONOGRAPHER Mauricio Po, RDCS, CCT cc:  ------------------------------------------------------------ LV EF: 55% - 60%  ------------------------------------------------------------ History: PMH: Breast Cancer   ASSESSMENT: 63 y.o. [name], [place] woman status post right breast upper outer quadrant and right axillary lymph node biopsy 12/23/2012 for a clinical T3 N1 M1, stage IV invasive ductal carcinoma, grade 3, triple negative, with an MIB-1 of 86%.  (1) right liver lobe biopsy 01/21/2013 confirms metastatic adenocarcinoma; scans show involvement of the liver, lungs, and likely bone.  (2) enrolled in [facility] study [id number] (docetaxel + oral gamma secretase inibitor [id number]), received one cycle starting 02/04/2013  but withdrew because of poor tolerance despite treatment interruptions and decreased dosing of the oral component  (3)  chest CT in early November 2014 was compared with studies in August 2014 and showed interval progression of metastatic breast cancer, with an enlarging primary right breast mass, enlarging right axillary lymph nodes, enlarging pulmonary nodules and new/enlarging hepatic metastases.  (4) Abraxane started 03/19/2013, given day 1 and day 8 of each 21 day cycle, stopped with the day  1 cycle 3 dose (04/29/2013) because of local progression of disease  (5) cyclophosphamide and doxorubicin started 05/20/2013, given in dose dense fashion  with Neulasta support on day 2.  The plan is to complete 4 cycles, to be followed by definitive local treatment.   PLAN: #1 proceed with cycle 2 chemotherapy with dose dense AC today #2 CBC and CMP are acceptable for  chemotherapy #3 Neulasta 6 mg subcutaneously x1 dose to be given tomorrow #4 Follow up in one week for labs and physician visit #5 cycle 3 chemotherapy in 2 weeks  All questions were answered. The patient knows to call the clinic with any problems, questions or concerns. We can certainly see the patient much sooner if necessary.  I spent 20 minutes counseling the patient face to face. The total time spent in the appointment was 30 minutes.     Wilmon Arms, MD  Medical Oncology   06/03/2013 3:31 PM

## 2013-06-03 NOTE — Patient Instructions (Signed)
[facility] Discharge Instructions for Patients Receiving Chemotherapy  Today you received the following chemotherapy agents Adriamycin, Cytoxan.   To help prevent nausea and vomiting after your treatment, we encourage you to take your nausea medication as prescribed.    If you develop nausea and vomiting that is not controlled by your nausea medication, call the clinic.   BELOW ARE SYMPTOMS THAT SHOULD BE REPORTED IMMEDIATELY:  *FEVER GREATER THAN 100.5 F  *CHILLS WITH OR WITHOUT FEVER  NAUSEA AND VOMITING THAT IS NOT CONTROLLED WITH YOUR NAUSEA MEDICATION  *UNUSUAL SHORTNESS OF BREATH  *UNUSUAL BRUISING OR BLEEDING  TENDERNESS IN MOUTH AND THROAT WITH OR WITHOUT PRESENCE OF ULCERS  *URINARY PROBLEMS  *BOWEL PROBLEMS  UNUSUAL RASH Items with * indicate a potential emergency and should be followed up as soon as possible.  Feel free to call the clinic should you have any questions or concerns. The clinic phone number is [phone].  It was my pleasure to take care of you today!  Tina Ring, RN    

## 2013-06-04 ENCOUNTER — Telehealth: Payer: Self-pay | Admitting: Oncology

## 2013-06-04 ENCOUNTER — Ambulatory Visit (HOSPITAL_BASED_OUTPATIENT_CLINIC_OR_DEPARTMENT_OTHER): Payer: BC Managed Care – PPO

## 2013-06-04 VITALS — BP 133/58 | HR 99 | Temp 98.3°F

## 2013-06-04 DIAGNOSIS — Z5189 Encounter for other specified aftercare: Secondary | ICD-10-CM

## 2013-06-04 DIAGNOSIS — C787 Secondary malignant neoplasm of liver and intrahepatic bile duct: Secondary | ICD-10-CM

## 2013-06-04 DIAGNOSIS — C50411 Malignant neoplasm of upper-outer quadrant of right female breast: Secondary | ICD-10-CM

## 2013-06-04 DIAGNOSIS — C50419 Malignant neoplasm of upper-outer quadrant of unspecified female breast: Secondary | ICD-10-CM

## 2013-06-04 DIAGNOSIS — C7800 Secondary malignant neoplasm of unspecified lung: Secondary | ICD-10-CM

## 2013-06-04 MED ORDER — PEGFILGRASTIM INJECTION 6 MG/0.6ML
6.0000 mg | Freq: Once | SUBCUTANEOUS | Status: AC
  Administered 2013-06-04: 6 mg via SUBCUTANEOUS
  Filled 2013-06-04: qty 0.6

## 2013-06-04 NOTE — Telephone Encounter (Signed)
Sent pt medical records to  [facility]

## 2013-06-04 NOTE — Patient Instructions (Signed)

## 2013-06-05 ENCOUNTER — Ambulatory Visit: Payer: BC Managed Care – PPO | Admitting: Oncology

## 2013-06-05 ENCOUNTER — Other Ambulatory Visit: Payer: Self-pay | Admitting: *Deleted

## 2013-06-05 ENCOUNTER — Other Ambulatory Visit: Payer: BC Managed Care – PPO

## 2013-06-05 DIAGNOSIS — G4700 Insomnia, unspecified: Secondary | ICD-10-CM

## 2013-06-05 MED ORDER — ZOLPIDEM TARTRATE 5 MG PO TABS: 5.0000 mg | ORAL_TABLET | Freq: Every evening | ORAL | Status: DC | PRN

## 2013-06-06 ENCOUNTER — Other Ambulatory Visit: Payer: Self-pay | Admitting: Oncology

## 2013-06-10 ENCOUNTER — Other Ambulatory Visit (HOSPITAL_BASED_OUTPATIENT_CLINIC_OR_DEPARTMENT_OTHER): Payer: BC Managed Care – PPO

## 2013-06-10 ENCOUNTER — Telehealth: Payer: Self-pay | Admitting: Oncology

## 2013-06-10 ENCOUNTER — Ambulatory Visit (HOSPITAL_BASED_OUTPATIENT_CLINIC_OR_DEPARTMENT_OTHER): Payer: BC Managed Care – PPO | Admitting: Hematology and Oncology

## 2013-06-10 ENCOUNTER — Ambulatory Visit (HOSPITAL_BASED_OUTPATIENT_CLINIC_OR_DEPARTMENT_OTHER): Payer: BC Managed Care – PPO

## 2013-06-10 VITALS — BP 105/63 | HR 88 | Temp 98.1°F | Resp 19 | Ht 63.0 in | Wt 127.2 lb

## 2013-06-10 DIAGNOSIS — R630 Anorexia: Secondary | ICD-10-CM

## 2013-06-10 DIAGNOSIS — D702 Other drug-induced agranulocytosis: Secondary | ICD-10-CM

## 2013-06-10 DIAGNOSIS — R112 Nausea with vomiting, unspecified: Secondary | ICD-10-CM

## 2013-06-10 DIAGNOSIS — C50411 Malignant neoplasm of upper-outer quadrant of right female breast: Secondary | ICD-10-CM

## 2013-06-10 DIAGNOSIS — C50419 Malignant neoplasm of upper-outer quadrant of unspecified female breast: Secondary | ICD-10-CM

## 2013-06-10 DIAGNOSIS — D72819 Decreased white blood cell count, unspecified: Secondary | ICD-10-CM

## 2013-06-10 DIAGNOSIS — C787 Secondary malignant neoplasm of liver and intrahepatic bile duct: Secondary | ICD-10-CM

## 2013-06-10 DIAGNOSIS — C773 Secondary and unspecified malignant neoplasm of axilla and upper limb lymph nodes: Secondary | ICD-10-CM

## 2013-06-10 DIAGNOSIS — E860 Dehydration: Secondary | ICD-10-CM

## 2013-06-10 DIAGNOSIS — C7800 Secondary malignant neoplasm of unspecified lung: Secondary | ICD-10-CM

## 2013-06-10 LAB — CBC WITH DIFFERENTIAL/PLATELET
BASO%: 3.9 % — ABNORMAL HIGH (ref 0.0–2.0)
Basophils Absolute: 0 10*3/uL (ref 0.0–0.1)
EOS%: 3.9 % (ref 0.0–7.0)
Eosinophils Absolute: 0 10*3/uL (ref 0.0–0.5)
HCT: 35.8 % (ref 34.8–46.6)
HGB: 12 g/dL (ref 11.6–15.9)
LYMPH%: 86.3 % — ABNORMAL HIGH (ref 14.0–49.7)
MCH: 29.6 pg (ref 25.1–34.0)
MCHC: 33.5 g/dL (ref 31.5–36.0)
MCV: 88.2 fL (ref 79.5–101.0)
MONO#: 0 10*3/uL — ABNORMAL LOW (ref 0.1–0.9)
MONO%: 5.9 % (ref 0.0–14.0)
NEUT#: 0 10*3/uL — CL (ref 1.5–6.5)
NEUT%: 0 % — ABNORMAL LOW (ref 38.4–76.8)
Platelets: 166 10*3/uL (ref 145–400)
RBC: 4.06 10*6/uL (ref 3.70–5.45)
RDW: 13.8 % (ref 11.2–14.5)
WBC: 0.5 10*3/uL — CL (ref 3.9–10.3)
lymph#: 0.4 10*3/uL — ABNORMAL LOW (ref 0.9–3.3)
nRBC: 0 % (ref 0–0)

## 2013-06-10 LAB — COMPREHENSIVE METABOLIC PANEL (CC13)
ALT: 31 U/L (ref 0–55)
AST: 21 U/L (ref 5–34)
Albumin: 3.6 g/dL (ref 3.5–5.0)
Alkaline Phosphatase: 125 U/L (ref 40–150)
Anion Gap: 11 mEq/L (ref 3–11)
BUN: 17.9 mg/dL (ref 7.0–26.0)
CO2: 25 mEq/L (ref 22–29)
Calcium: 9.6 mg/dL (ref 8.4–10.4)
Chloride: 98 mEq/L (ref 98–109)
Creatinine: 0.7 mg/dL (ref 0.6–1.1)
Glucose: 115 mg/dl (ref 70–140)
Potassium: 4.5 mEq/L (ref 3.5–5.1)
Sodium: 135 mEq/L — ABNORMAL LOW (ref 136–145)
Total Bilirubin: 0.7 mg/dL (ref 0.20–1.20)
Total Protein: 7 g/dL (ref 6.4–8.3)

## 2013-06-10 MED ORDER — ONDANSETRON 8 MG/50ML IVPB (CHCC)
8.0000 mg | Freq: Once | INTRAVENOUS | Status: AC
  Administered 2013-06-10: 8 mg via INTRAVENOUS

## 2013-06-10 MED ORDER — DEXAMETHASONE SODIUM PHOSPHATE 10 MG/ML IJ SOLN
INTRAMUSCULAR | Status: AC
  Filled 2013-06-10: qty 1

## 2013-06-10 MED ORDER — SODIUM CHLORIDE 0.9 % IJ SOLN
10.0000 mL | INTRAMUSCULAR | Status: DC | PRN
  Administered 2013-06-10: 10 mL via INTRAVENOUS
  Filled 2013-06-10: qty 10

## 2013-06-10 MED ORDER — DEXAMETHASONE SODIUM PHOSPHATE 10 MG/ML IJ SOLN
4.0000 mg | Freq: Once | INTRAMUSCULAR | Status: AC
  Administered 2013-06-10: 4 mg via INTRAVENOUS

## 2013-06-10 MED ORDER — SODIUM CHLORIDE 0.9 % IV SOLN: Freq: Once | INTRAVENOUS | Status: DC

## 2013-06-10 MED ORDER — DEXAMETHASONE SODIUM PHOSPHATE 10 MG/ML IJ SOLN: 4.0000 mg | Freq: Once | INTRAMUSCULAR | Status: DC

## 2013-06-10 MED ORDER — HEPARIN SOD (PORK) LOCK FLUSH 100 UNIT/ML IV SOLN
500.0000 [IU] | Freq: Once | INTRAVENOUS | Status: AC
  Administered 2013-06-10: 500 [IU] via INTRAVENOUS
  Filled 2013-06-10: qty 5

## 2013-06-10 MED ORDER — SODIUM CHLORIDE 0.9 % IV SOLN
1500.0000 mL | Freq: Once | INTRAVENOUS | Status: AC
  Administered 2013-06-10: 1500 mL via INTRAVENOUS

## 2013-06-10 MED ORDER — ONDANSETRON 8 MG/NS 50 ML IVPB
INTRAVENOUS | Status: AC
  Filled 2013-06-10: qty 8

## 2013-06-10 NOTE — Progress Notes (Signed)
To the Patient ID: Carrie Berry, female   DOB: Aug 19, 1950, 63 y.o.   MRN: [id number] ID: [name] OB: Aug 07, 1950  MR#: [id number]  CSN#:[id number]  PCP: [name], MD GYN:   SU: [name] OTHER MD: Thea Silversmith, Earle Gell, Christene Slates, Erline Levine  DIAGNOSIS: Metastatic breast cancer   CURRENT THERAPY: Status post cycle 2 chemotherapy with dose dense AC   CHIEF COMPLAINT:  Nausea and vomiting  HISTORY OF PRESENT ILLNESS: Carrie Berry noted a mass in her right breast December of 2013, but did not think much of it. It did grow some lower the summer, but she was keeping her grandchild at that time and was too busy so she did not bring it to Dr. Bernell List attention until August. He set her up for bilateral mammography and ultrasonography at [facility] 12/18/2012 and this measured, on the right, a large irregular lobulated mass in the upper outer quadrant which by ultrasound measured 5.7 cm. The right axilla showed some lymph nodes with thickened cortices. In the left breast there was a focal asymmetry at the depth, lateral to the nipple, and by ultrasound there was a 9 mm ill-defined hypoechoic lesion in this location. Biopsy of the left breast lesion showed only fibrocystic changes.  Biopsy of the right breast mass and right axillary adenopathy ([id number]) on 12/23/2012 showed both to be involved by invasive ductal carcinoma, grade 2, triple negative, with an MIB-1 of 86%.  MRI of the breast 12/27/2012 at [facility] showed in the right breast a mass abutting the pectoralis muscle without enhancement of the muscle measuring 4.8 cm. The satellite nodule measuring approximately 3 mm was also noted superior to the mass and several abnormal and enlarged right axillary lymph nodes were noted, one of which appeared to be necrotic. The largest node measured 2.0 cm. Unfortunately, numerous bilateral pulmonary nodules were also noted.  The patient's subsequent history is as detailed  below   INTERVAL HISTORY: Carrie Berry is here for followup  Visit. She completed second cycle of chemotherapy with dose dense AC on 06/03/2013. She also received the Neulasta injection  24 hours after chemotherapy. She complains of feeling very tired today. She's not able to eat good she has persistent nausea and vomiting 2-3 times daily for the past 2 days. Her intake is very poor. she also complains of dry mouth. She is only using biotin mouthwash once daily.   She denies any fever shortness of breath, chest pains, palpitations. She denies any headache, blurred vision, constipation, blood in the stool,  blood in the urine.  She says that her right breast pain is resolved . she also noticed marked decrease in the size of the right breast mass.   REVIEW OF SYSTEMS:   All 14 review of systems is been assessed and the pertinent symptoms are listed in interval history    PAST MEDICAL HISTORY: Past Medical History  Diagnosis Date  . Recurrent sinus infections   . Arthritis   . PONV (postoperative nausea and vomiting)   . Anxiety   . Cancer     breast  . Breast cancer   . GERD (gastroesophageal reflux disease)     PAST SURGICAL HISTORY: Past Surgical History  Procedure Laterality Date  . Tubal ligation    . Tubal ligation  1985  . Portacath placement N/A 01/10/2013    Procedure: ULTRASOUND GUIDED PORT-A-CATH INSERTION WITH FLUOROSCOPY;  Surgeon: [name], MD;  Location: [facility];  Service: General;  Laterality: N/A;  FAMILY HISTORY Family History  Problem Relation Age of Onset  . Bladder Cancer Father   . Colon cancer Maternal Grandmother    the patient's parents are living, both in their 51s. The patient's father was diagnosed with bladder cancer the age of 44. The patient's mother's mother had some type of gastrointestinal cancer. The patient had one brother, no sisters. There is no history of breast or ovarian cancer in the family other than a cousin on the father's side  who was diagnosed with breast cancer apparently before the age of 48.  GYNECOLOGIC HISTORY:  Menarche age 50, first live birth age 44, the patient is [place] P2. She went through menopause in 2008. She did not take hormone replacement. She took birth control for approximately 22 years remotely.  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 She's currently on disability. Her husband Carrie Berry [name]"), is vice Development worker, international aid for [employer]. The patient's daughter Gasper Berry is a stay-at-home mom in [place], the patient's son Carrie Berry unfortunately died in an automobile accident in December of 2011. The patient has 2 grandchildren. She attends to a local [employer].    ADVANCED DIRECTIVES: Not in place   HEALTH MAINTENANCE: (Updated December 2014) History  Substance Use Topics  . Smoking status: Never Smoker   . Smokeless tobacco: Never Used  . Alcohol Use: No     Colonoscopy: 2005  PAP: Not on file  Bone density: Never  Lipid panel:  Dr. Delfina Redwood   Allergies  Allergen Reactions  . Ondansetron Hcl Other (See Comments)    HEADACHE  . Codeine Nausea And Vomiting    "violently ill"  . Penicillins Rash    Childhood reaction -    Current Outpatient Prescriptions  Medication Sig Dispense Refill  . acetaminophen (TYLENOL) 325 MG tablet Take 650 mg by mouth every 8 (eight) hours as needed for pain.      . ciprofloxacin (CIPRO) 500 MG tablet Take 1 tablet (500 mg total) by mouth 2 (two) times daily. X 7 days  14 tablet  3  . dexamethasone (DECADRON) 4 MG tablet Take 2 tablets by mouth once a day on the day after chemotherapy and then take 2 tablets two times a day for 2 days. Take with food.  30 tablet  1  . lidocaine-prilocaine (EMLA) cream Apply topically as needed.  30 g  3  . loratadine-pseudoephedrine (CLARITIN-D 24-HOUR) 10-240 MG per 24 hr tablet Take 1  tablet by mouth daily.      Marland Kitchen LORazepam (ATIVAN) 0.5 MG tablet Take 1 tablet (0.5 mg total) by mouth every 6 (six) hours as needed (Nausea or vomiting).  30 tablet  0  . LORazepam (ATIVAN) 1 MG tablet Take 0.5 mg by mouth every 6 (six) hours as needed for anxiety.       . Multiple Vitamin (MULTIVITAMIN WITH MINERALS) TABS tablet Take 1 tablet by mouth daily.      Marland Kitchen omeprazole (PRILOSEC) 20 MG capsule Take 1 capsule (20 mg total) by mouth 2 (two) times daily.  60 capsule  3  . ondansetron (ZOFRAN) 8 MG tablet       . PATADAY 0.2 % SOLN Place 1 drop into both eyes daily as needed (dry eyes).       . prochlorperazine (COMPAZINE) 10 MG tablet Take 1 tablet (10 mg total) by mouth every 6 (six) hours as needed for nausea.  30 tablet  2  . promethazine (PHENERGAN) 25 MG tablet Take 1 tablet (25 mg total) by mouth every 6 (six) hours as needed for nausea or vomiting.  30 tablet  1  . tobramycin-dexamethasone (TOBRADEX) ophthalmic solution Place 1 drop into both eyes daily.  5 mL  1  . traMADol (ULTRAM) 50 MG tablet Take 1 tablet (50 mg total) by mouth every 6 (six) hours as needed for moderate pain.  60 tablet  0  . traZODone (DESYREL) 50 MG tablet TAKE 1 TO 2 TABLETS BY MOUTH AT BEDTIME FOR SLEEP  60 tablet  0  . triamcinolone (NASACORT) 55 MCG/ACT nasal inhaler Place 2 sprays into the nose daily.      Marland Kitchen zolpidem (AMBIEN) 5 MG tablet Take 1 tablet (5 mg total) by mouth at bedtime as needed for sleep.  20 tablet  0   No current facility-administered medications for this visit.    OBJECTIVE: Middle-aged white woman in no acute distress Filed Vitals:   06/10/13 1130  BP: 105/63  Pulse: 88  Temp: 98.1 F (36.7 C)  Resp: 19     Body mass index is 22.54 kg/(m^2).    ECOG FS: 1 Filed Weights   06/10/13 1130  Weight: 127 lb 3.2 oz (57.698 kg)   Physical Exam: HEENT:  Sclerae anicteric.  Oropharynx clear and moist. No ulcerations, and no evidence of oropharyngeal candidiasis. Nasal mucosa is mildly  erythematous and edematous bilaterally. Neck is supple.  NODES:  No cervical or supraclavicular lymphadenopathy palpated.  BREAST EXAM: There is still a large mass in the lateral portion of the right breast. The palpable nodules in the lateral portion of the breast do appear somewhat softer with palpation. There is also no tenderness to palpation today. There is a palpable nodule in the right axilla.  LUNGS:  Clear to auscultation bilaterally.  No wheezes or rhonchi HEART:  Regular rate and rhythm. No murmur appreciated ABDOMEN:  Soft, nontender. No hepatomegaly palpated.  Positive bowel sounds.  MSK:  No focal spinal tenderness to palpation. Good range of motion bilaterally in the upper extremities. EXTREMITIES:  No peripheral edema.   SKIN:  Benign with no visible rashes or skin lesions. Port is intact in the chest wall with no erythema or edema, and no evidence of infection/cellulitis. NEURO:  Nonfocal. Well oriented.  Appropriate affect.   LAB RESULTS:   Lab Results  Component Value Date   WBC 0.5* 06/10/2013   NEUTROABS 0.0* 06/10/2013   HGB 12.0 06/10/2013   HCT 35.8 06/10/2013   MCV 88.2 06/10/2013   PLT 166 06/10/2013      Chemistry      Component Value Date/Time   NA 135* 06/10/2013 1106   NA 136 02/28/2013 2230   K 4.5 06/10/2013 1106   K 3.4* 02/28/2013 2230   CL 99 02/28/2013 2230   CO2 25 06/10/2013 1106   CO2 28 02/28/2013 2230   BUN 17.9 06/10/2013 1106   BUN 8 02/28/2013 2230   CREATININE 0.7 06/10/2013 1106   CREATININE 0.59 02/28/2013 2230      Component Value Date/Time   CALCIUM 9.6 06/10/2013 1106   CALCIUM 9.0 02/28/2013 2230   ALKPHOS 125 06/10/2013 1106   ALKPHOS 102 02/28/2013 2230   AST 21 06/10/2013 1106   AST 19 02/28/2013 2230   ALT 31 06/10/2013 1106   ALT 18 02/28/2013 2230   BILITOT 0.70 06/10/2013 1106   BILITOT 0.3 02/28/2013 2230  STUDIES:  Ct Chest W Contrast  05/16/2013   CLINICAL DATA:  Followup of breast cancer. Diagnosed 8/13.  Chemotherapy ongoing.  EXAM: CT CHEST WITH CONTRAST  TECHNIQUE: Multidetector CT imaging of the chest was performed during intravenous contrast administration.  CONTRAST:  49mL OMNIPAQUE IOHEXOL 300 MG/ML  SOLN  COMPARISON:  03/17/2013  FINDINGS: Lungs/Pleura: Mild progression of pulmonary metastasis. Index right lower lobe nodule measures 6 mm on image 29 today versus 5 mm on the prior.  Posterior left upper lobe 8 mm nodule on image 20/series 5 is increased from 6 mm on the prior.  Index right lower lobe nodule measures 12 mm on image 25 versus 9 mm on the prior.  No pleural fluid.  Heart/Mediastinum: A right-sided Port-A-Cath which terminates at the mid SVC.  Right axillary node which measures 1.5 cm on image 23 versus 1.6 cm on the prior.  Right breast mass which measures 5.6 x 7.8 cm on image 38 and has enlarged from 4.7 x 4.7 cm on the prior.  No left axillary adenopathy. Normal heart size, without pericardial effusion. No mediastinal or hilar adenopathy.  Upper Abdomen: Progressive hepatic metastasis. Index posterior segment right hepatic lobe 2.1 cm lesion on image 59 measured 1.7 cm on the prior.  A medial segment left liver lobe 1.7 cm lesion on image 64 measured 1.4 cm on the prior.  Normal adrenal glands.  Bones/Musculoskeletal:  No acute osseous abnormality.  IMPRESSION: 1. Progressive metastatic disease, including within the liver and lungs. 2. Primary breast mass is also enlarged since the prior. 3. Right axillary adenopathy is similar to minimally improved.   Electronically Signed   By: Abigail Miyamoto M.D.   On: 05/16/2013 09:21     Transthoracic Echocardiography  Patient: Danaya, Kitchel MR #: [id number] Study Date: 05/02/2013 Gender: F Age: 42 Height: 162.6cm Weight: 60.8kg BSA: 1.36m^2 Pt. Status: Room:  PERFORMING [place], [place] Ravens, Amy SONOGRAPHER Mauricio Po, RDCS,  CCT cc:  ------------------------------------------------------------ LV EF: 55% - 60%  ------------------------------------------------------------ History: PMH: Breast Cancer   ASSESSMENT: 63 y.o. [name], [place] woman status post right breast upper outer quadrant and right axillary lymph node biopsy 12/23/2012 for a clinical T3 N1 M1, stage IV invasive ductal carcinoma, grade 3, triple negative, with an MIB-1 of 86%.  (1) right liver lobe biopsy 01/21/2013 confirms metastatic adenocarcinoma; scans show involvement of the liver, lungs, and likely bone.  (2) enrolled in [facility] study [id number] (docetaxel + oral gamma secretase inibitor [id number]), received one cycle starting 02/04/2013  but withdrew because of poor tolerance despite treatment interruptions and decreased dosing of the oral component  (3)  chest CT in early November 2014 was compared with studies in August 2014 and showed interval progression of metastatic breast cancer, with an enlarging primary right breast mass, enlarging right axillary lymph nodes, enlarging pulmonary nodules and new/enlarging hepatic metastases.  (4) Abraxane started 03/19/2013, given day 1 and day 8 of each 21 day cycle, stopped with the day 1 cycle 3 dose (04/29/2013) because of local progression of disease  (5) cyclophosphamide and doxorubicin started 05/20/2013, given in dose dense fashion  with Neulasta support on day #2.  The plan is to complete 4 cycles, to be followed by definitive local treatment.  (6) leukopenia with severe neutropenia secondary to chemotherapy-induced  (7) nausea and vomiting ,poor by mouth intake secondary to chemotherapy  (8) fatigue secondary to chemotherapy-induced   PLAN: #1 we will arrange for IV Zofran 8 mg, 4  mg of dexamethasone IV and IV fluids with normal saline 1.5 L to be given today #2 Her CBC revealed leukopenia of 0.5 with ANC of 0. I have asked the patient to start  taking Cipro prophylactically for 1 week  starting from today. Instructed the patient on neutropenic precautions #4 Follow up in one week for labs and physician visit and for chemotherapy if labs are acceptable #5 cycle 3 chemotherapy in 1 week #6 I have reiterated the patient on to take Zofran 8 mg po every 8 hours and to alternate with compazine 10 mg po every 8 hours for nausea and vomiting #7 I have  instructed the patient to use Biotene mouthwash every 6 hours prn   All questions were answered. The patient knows to call the clinic with any problems, questions or concerns. We can certainly see the patient much sooner if necessary.  I spent 20 minutes counseling the patient face to face. The total time spent in the appointment was 30 minutes.     Wilmon Arms, MD  Medical Oncology   06/10/2013 12:22 PM

## 2013-06-10 NOTE — Patient Instructions (Signed)
Dehydration, Adult Dehydration is when you lose more fluids from the body than you take in. Vital organs like the kidneys, brain, and heart cannot function without a proper amount of fluids and salt. Any loss of fluids from the body can cause dehydration.  CAUSES   Vomiting.  Diarrhea.  Excessive sweating.  Excessive urine output.  Fever. SYMPTOMS  Mild dehydration  Thirst.  Dry lips.  Slightly dry mouth. Moderate dehydration  Very dry mouth.  Sunken eyes.  Skin does not bounce back quickly when lightly pinched and released.  Dark urine and decreased urine production.  Decreased tear production.  Headache. Severe dehydration  Very dry mouth.  Extreme thirst.  Rapid, weak pulse (more than 100 beats per minute at rest).  Cold hands and feet.  Not able to sweat in spite of heat and temperature.  Rapid breathing.  Blue lips.  Confusion and lethargy.  Difficulty being awakened.  Minimal urine production.  No tears. DIAGNOSIS  Your caregiver will diagnose dehydration based on your symptoms and your exam. Blood and urine tests will help confirm the diagnosis. The diagnostic evaluation should also identify the cause of dehydration. TREATMENT  Treatment of mild or moderate dehydration can often be done at home by increasing the amount of fluids that you drink. It is best to drink small amounts of fluid more often. Drinking too much at one time can make vomiting worse. Refer to the home care instructions below. Severe dehydration needs to be treated at the hospital where you will probably be given intravenous (IV) fluids that contain water and electrolytes. HOME CARE INSTRUCTIONS   Ask your caregiver about specific rehydration instructions.  Drink enough fluids to keep your urine clear or pale yellow.  Drink small amounts frequently if you have nausea and vomiting.  Eat as you normally do.  Avoid:  Foods or drinks high in sugar.  Carbonated  drinks.  Juice.  Extremely hot or cold fluids.  Drinks with caffeine.  Fatty, greasy foods.  Alcohol.  Tobacco.  Overeating.  Gelatin desserts.  Wash your hands well to avoid spreading bacteria and viruses.  Only take over-the-counter or prescription medicines for pain, discomfort, or fever as directed by your caregiver.  Ask your caregiver if you should continue all prescribed and over-the-counter medicines.  Keep all follow-up appointments with your caregiver. SEEK MEDICAL CARE IF:  You have abdominal pain and it increases or stays in one area (localizes).  You have a rash, stiff neck, or severe headache.  You are irritable, sleepy, or difficult to awaken.  You are weak, dizzy, or extremely thirsty. SEEK IMMEDIATE MEDICAL CARE IF:   You are unable to keep fluids down or you get worse despite treatment.  You have frequent episodes of vomiting or diarrhea.  You have blood or green matter (bile) in your vomit.  You have blood in your stool or your stool looks black and tarry.  You have not urinated in 6 to 8 hours, or you have only urinated a small amount of very dark urine.  You have a fever.  You faint. MAKE SURE YOU:   Understand these instructions.  Will watch your condition.  Will get help right away if you are not doing well or get worse. Document Released: 05/01/2005 Document Revised: 07/24/2011 Document Reviewed: 12/19/2010 Exit[employer] Patient Information 2014 [employer].  

## 2013-06-17 ENCOUNTER — Ambulatory Visit (HOSPITAL_BASED_OUTPATIENT_CLINIC_OR_DEPARTMENT_OTHER): Payer: BC Managed Care – PPO | Admitting: Hematology and Oncology

## 2013-06-17 ENCOUNTER — Telehealth: Payer: Self-pay | Admitting: Oncology

## 2013-06-17 ENCOUNTER — Telehealth: Payer: Self-pay | Admitting: Dietician

## 2013-06-17 ENCOUNTER — Ambulatory Visit (HOSPITAL_BASED_OUTPATIENT_CLINIC_OR_DEPARTMENT_OTHER): Payer: BC Managed Care – PPO

## 2013-06-17 ENCOUNTER — Other Ambulatory Visit (HOSPITAL_BASED_OUTPATIENT_CLINIC_OR_DEPARTMENT_OTHER): Payer: BC Managed Care – PPO

## 2013-06-17 ENCOUNTER — Telehealth: Payer: Self-pay | Admitting: *Deleted

## 2013-06-17 VITALS — BP 116/70 | HR 88 | Temp 98.3°F | Resp 18 | Ht 63.0 in | Wt 128.7 lb

## 2013-06-17 DIAGNOSIS — C50419 Malignant neoplasm of upper-outer quadrant of unspecified female breast: Secondary | ICD-10-CM

## 2013-06-17 DIAGNOSIS — C50411 Malignant neoplasm of upper-outer quadrant of right female breast: Secondary | ICD-10-CM

## 2013-06-17 DIAGNOSIS — Z5111 Encounter for antineoplastic chemotherapy: Secondary | ICD-10-CM

## 2013-06-17 DIAGNOSIS — C7800 Secondary malignant neoplasm of unspecified lung: Secondary | ICD-10-CM

## 2013-06-17 DIAGNOSIS — C773 Secondary and unspecified malignant neoplasm of axilla and upper limb lymph nodes: Secondary | ICD-10-CM

## 2013-06-17 DIAGNOSIS — C787 Secondary malignant neoplasm of liver and intrahepatic bile duct: Secondary | ICD-10-CM

## 2013-06-17 LAB — CBC WITH DIFFERENTIAL/PLATELET
BASO%: 0.7 % (ref 0.0–2.0)
Basophils Absolute: 0.1 10*3/uL (ref 0.0–0.1)
EOS ABS: 0 10*3/uL (ref 0.0–0.5)
EOS%: 0.1 % (ref 0.0–7.0)
HEMATOCRIT: 30.9 % — AB (ref 34.8–46.6)
HGB: 10.1 g/dL — ABNORMAL LOW (ref 11.6–15.9)
LYMPH%: 8.9 % — AB (ref 14.0–49.7)
MCH: 29.4 pg (ref 25.1–34.0)
MCHC: 32.7 g/dL (ref 31.5–36.0)
MCV: 90.1 fL (ref 79.5–101.0)
MONO#: 1.2 10*3/uL — ABNORMAL HIGH (ref 0.1–0.9)
MONO%: 8.7 % (ref 0.0–14.0)
NEUT%: 81.6 % — ABNORMAL HIGH (ref 38.4–76.8)
NEUTROS ABS: 11.3 10*3/uL — AB (ref 1.5–6.5)
NRBC: 0 % (ref 0–0)
PLATELETS: 245 10*3/uL (ref 145–400)
RBC: 3.43 10*6/uL — ABNORMAL LOW (ref 3.70–5.45)
RDW: 14.7 % — ABNORMAL HIGH (ref 11.2–14.5)
WBC: 13.8 10*3/uL — AB (ref 3.9–10.3)
lymph#: 1.2 10*3/uL (ref 0.9–3.3)

## 2013-06-17 MED ORDER — DEXAMETHASONE SODIUM PHOSPHATE 20 MG/5ML IJ SOLN
12.0000 mg | Freq: Once | INTRAMUSCULAR | Status: AC
Start: 2013-06-17 — End: 2013-06-17
  Administered 2013-06-17: 12 mg via INTRAVENOUS

## 2013-06-17 MED ORDER — PALONOSETRON HCL INJECTION 0.25 MG/5ML
0.2500 mg | Freq: Once | INTRAVENOUS | Status: AC
  Administered 2013-06-17: 0.25 mg via INTRAVENOUS

## 2013-06-17 MED ORDER — SODIUM CHLORIDE 0.9 % IJ SOLN
10.0000 mL | INTRAMUSCULAR | Status: DC | PRN
  Administered 2013-06-17: 10 mL
  Filled 2013-06-17: qty 10

## 2013-06-17 MED ORDER — DEXAMETHASONE SODIUM PHOSPHATE 20 MG/5ML IJ SOLN
INTRAMUSCULAR | Status: AC
  Filled 2013-06-17: qty 5

## 2013-06-17 MED ORDER — DOXORUBICIN HCL CHEMO IV INJECTION 2 MG/ML
60.0000 mg/m2 | Freq: Once | INTRAVENOUS | Status: AC
  Administered 2013-06-17: 98 mg via INTRAVENOUS
  Filled 2013-06-17: qty 49

## 2013-06-17 MED ORDER — SODIUM CHLORIDE 0.9 % IV SOLN
Freq: Once | INTRAVENOUS | Status: AC
  Administered 2013-06-17: 11:00:00 via INTRAVENOUS

## 2013-06-17 MED ORDER — SODIUM CHLORIDE 0.9 % IV SOLN
150.0000 mg | Freq: Once | INTRAVENOUS | Status: AC
  Administered 2013-06-17: 150 mg via INTRAVENOUS
  Filled 2013-06-17: qty 5

## 2013-06-17 MED ORDER — HEPARIN SOD (PORK) LOCK FLUSH 100 UNIT/ML IV SOLN
500.0000 [IU] | Freq: Once | INTRAVENOUS | Status: AC | PRN
  Administered 2013-06-17: 500 [IU]
  Filled 2013-06-17: qty 5

## 2013-06-17 MED ORDER — PALONOSETRON HCL INJECTION 0.25 MG/5ML
INTRAVENOUS | Status: AC
  Filled 2013-06-17: qty 5

## 2013-06-17 MED ORDER — SODIUM CHLORIDE 0.9 % IV SOLN
600.0000 mg/m2 | Freq: Once | INTRAVENOUS | Status: AC
  Administered 2013-06-17: 980 mg via INTRAVENOUS
  Filled 2013-06-17: qty 49

## 2013-06-17 NOTE — Telephone Encounter (Signed)
Brief Outpatient Oncology Nutrition Note  Patient has been identified to be at risk on malnutrition screen.  Wt Readings from Last 10 Encounters:  06/17/13 128 lb 11.2 oz (58.378 kg)  06/10/13 127 lb 3.2 oz (57.698 kg)  06/03/13 129 lb 14.4 oz (58.922 kg)  05/27/13 129 lb 9.6 oz (58.786 kg)  05/20/13 133 lb 12.8 oz (60.691 kg)  05/12/13 129 lb 11.2 oz (58.832 kg)  04/29/13 134 lb 4.8 oz (60.918 kg)  04/08/13 132 lb 3.2 oz (59.966 kg)  04/02/13 133 lb 9.6 oz (60.601 kg)  03/26/13 128 lb 8 oz (58.287 kg)   Dx:  Metastatic breast cancer  Called patient due to weight loss.  Patient was unavailable.  Message left with the [place] [address]'s contact information.  Antonieta Iba, RD, LDN

## 2013-06-17 NOTE — Progress Notes (Signed)
To the Patient ID: Carrie Berry, female   DOB: 01-08-1951, 63 y.o.   MRN: [id number] ID: Carrie Berry OB: 1950/10/28  MR#: [id number]  CSN#:[id number]  PCP: [name], MD GYN:   SU: [name] OTHER MD: Thea Silversmith, Earle Gell, Christene Slates, Erline Levine  DIAGNOSIS: Metastatic breast cancer   CURRENT THERAPY: Status post cycle 2 chemotherapy with dose dense AC on 06/03/2013   CHIEF COMPLAINT:  Initiation of cycle 3 chemotherapy with dose dense AC  HISTORY OF PRESENT ILLNESS: Addalyn noted a mass in her right breast December of 2013, but did not think much of it. It did grow some lower the summer, but she was keeping her grandchild at that time and was too busy so she did not bring it to Dr. Bernell List attention until August. He set her up for bilateral mammography and ultrasonography at [facility] 12/18/2012 and this measured, on the right, a large irregular lobulated mass in the upper outer quadrant which by ultrasound measured 5.7 cm. The right axilla showed some lymph nodes with thickened cortices. In the left breast there was a focal asymmetry at the depth, lateral to the nipple, and by ultrasound there was a 9 mm ill-defined hypoechoic lesion in this location. Biopsy of the left breast lesion showed only fibrocystic changes.  Biopsy of the right breast mass and right axillary adenopathy ([id number]) on 12/23/2012 showed both to be involved by invasive ductal carcinoma, grade 2, triple negative, with an MIB-1 of 86%.  MRI of the breast 12/27/2012 at [facility] imaging showed in the right breast a mass abutting the pectoralis muscle without enhancement of the muscle measuring 4.8 cm. The satellite nodule measuring approximately 3 mm was also noted superior to the mass and several abnormal and enlarged right axillary lymph nodes were noted, one of which appeared to be necrotic. The largest node measured 2.0 cm. Unfortunately, numerous bilateral pulmonary nodules were also noted.  The  patient's subsequent history is as detailed below   INTERVAL HISTORY: Ms. Carrie Berry is here for followup  Visit for cycle 3 chemotherapy with dose dense AC today. Since the time of last visit her  nausea and vomiting is resolved and dry mouth is improved. she is able to eat good and her appetite is coming back. She completed second cycle of chemotherapy with dose dense AC on 06/03/2013. She also received the Neulasta injection  24 hours after chemotherapy. Her last dose of prophylactic Cipro was yesterday.  She denies any fever, shortness of breath, chest pains, palpitations. She denies any headache, blurred vision, constipation, blood in the stool, blood in the urine. She says that her right breast pain is resolved . she also noticed marked decrease in the size of the right breast mass.   REVIEW OF SYSTEMS:   All 14 review of systems is been assessed and the pertinent symptoms are listed in interval history    PAST MEDICAL HISTORY: Past Medical History  Diagnosis Date  . Recurrent sinus infections   . Arthritis   . PONV (postoperative nausea and vomiting)   . Anxiety   . Cancer     breast  . Breast cancer   . GERD (gastroesophageal reflux disease)     PAST SURGICAL HISTORY: Past Surgical History  Procedure Laterality Date  . Tubal ligation    . Tubal ligation  1985  . Portacath placement N/A 01/10/2013    Procedure: ULTRASOUND GUIDED PORT-A-CATH INSERTION WITH FLUOROSCOPY;  Surgeon: [name], MD;  Location: [place];  Service: General;  Laterality: N/A;    FAMILY HISTORY Family History  Problem Relation Age of Onset  . Bladder Cancer Father   . Colon cancer Maternal Grandmother    the patient's parents are living, both in their 62s. The patient's father was diagnosed with bladder cancer the age of 55. The patient's mother's mother had some type of gastrointestinal cancer. The patient had one brother, no sisters. There is no history of breast or ovarian cancer in the family  other than a cousin on the father's side who was diagnosed with breast cancer apparently before the age of 31.  GYNECOLOGIC HISTORY:  Menarche age 16, first live birth age 35, the patient is [place] P2. She went through menopause in 2008. She did not take hormone replacement. She took birth control for approximately 22 years remotely.  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 She's currently on disability. Her husband Shereena Berquist [name]"), is vice Development worker, international aid for [employer]. The patient's daughter [name] is a stay-at-home mom in [place], the patient's son Darnelle Derrick unfortunately died in an automobile accident in December of 2011. The patient has 2 grandchildren. She attends to a local [employer].    ADVANCED DIRECTIVES: Not in place   HEALTH MAINTENANCE: (Updated December 2014) History  Substance Use Topics  . Smoking status: Never Smoker   . Smokeless tobacco: Never Used  . Alcohol Use: No     Colonoscopy: 2005  PAP: Not on file  Bone density: Never  Lipid panel:  Dr. Delfina Redwood   Allergies  Allergen Reactions  . Ondansetron Hcl Other (See Comments)    HEADACHE  . Codeine Nausea And Vomiting    "violently ill"  . Penicillins Rash    Childhood reaction -    Current Outpatient Prescriptions  Medication Sig Dispense Refill  . acetaminophen (TYLENOL) 325 MG tablet Take 650 mg by mouth every 8 (eight) hours as needed for pain.      . ciprofloxacin (CIPRO) 500 MG tablet Take 1 tablet (500 mg total) by mouth 2 (two) times daily. X 7 days  14 tablet  3  . dexamethasone (DECADRON) 4 MG tablet Take 2 tablets by mouth once a day on the day after chemotherapy and then take 2 tablets two times a day for 2 days. Take with food.  30 tablet  1  . lidocaine-prilocaine (EMLA) cream Apply topically as needed.  30 g  3  . loratadine-pseudoephedrine (CLARITIN-D  24-HOUR) 10-240 MG per 24 hr tablet Take 1 tablet by mouth daily.      Marland Kitchen LORazepam (ATIVAN) 0.5 MG tablet Take 1 tablet (0.5 mg total) by mouth every 6 (six) hours as needed (Nausea or vomiting).  30 tablet  0  . Multiple Vitamin (MULTIVITAMIN WITH MINERALS) TABS tablet Take 1 tablet by mouth daily.      Marland Kitchen omeprazole (PRILOSEC) 20 MG capsule Take 1 capsule (20 mg total) by mouth 2 (two) times daily.  60 capsule  3  . ondansetron (ZOFRAN) 8 MG tablet       . PATADAY 0.2 % SOLN Place 1 drop into both eyes daily as needed (dry eyes).       . prochlorperazine (COMPAZINE) 10 MG tablet Take 1 tablet (10 mg total) by mouth every 6 (six) hours as needed for nausea.  30 tablet  2  . promethazine (PHENERGAN) 25 MG tablet Take 1 tablet (25 mg  total) by mouth every 6 (six) hours as needed for nausea or vomiting.  30 tablet  1  . tobramycin-dexamethasone (TOBRADEX) ophthalmic solution Place 1 drop into both eyes daily.  5 mL  1  . traZODone (DESYREL) 50 MG tablet TAKE 1 TO 2 TABLETS BY MOUTH AT BEDTIME FOR SLEEP  60 tablet  0  . triamcinolone (NASACORT) 55 MCG/ACT nasal inhaler Place 2 sprays into the nose daily.      Marland Kitchen zolpidem (AMBIEN) 5 MG tablet Take 1 tablet (5 mg total) by mouth at bedtime as needed for sleep.  20 tablet  0  . LORazepam (ATIVAN) 1 MG tablet Take 0.5 mg by mouth every 6 (six) hours as needed for anxiety.       . traMADol (ULTRAM) 50 MG tablet Take 1 tablet (50 mg total) by mouth every 6 (six) hours as needed for moderate pain.  60 tablet  0   No current facility-administered medications for this visit.   Facility-Administered Medications Ordered in Other Visits  Medication Dose Route Frequency Provider Last Rate Last Dose  . cyclophosphamide (CYTOXAN) 980 mg in sodium chloride 0.9 % 250 mL chemo infusion  600 mg/m2 (Treatment Plan Actual) Intravenous Once Annamarie Dawley, MD      . DOXOrubicin (ADRIAMYCIN) chemo injection 98 mg  60 mg/m2 (Treatment Plan Actual) Intravenous Once Annamarie Dawley, MD      . fosaprepitant (EMEND) 150 mg in sodium chloride 0.9 % 145 mL IVPB  150 mg Intravenous Once Annamarie Dawley, MD 300 mL/hr at 06/17/13 1054 150 mg at 06/17/13 1054  . heparin lock flush 100 unit/mL  500 Units Intracatheter Once PRN Annamarie Dawley, MD      . sodium chloride 0.9 % injection 10 mL  10 mL Intracatheter PRN Annamarie Dawley, MD        OBJECTIVE: Middle-aged white woman in no acute distress Filed Vitals:   06/17/13 0900  BP: 116/70  Pulse: 88  Temp: 98.3 F (36.8 C)  Resp: 18     Body mass index is 22.8 kg/(m^2).    ECOG FS: 1 Filed Weights   06/17/13 0900  Weight: 128 lb 11.2 oz (58.378 kg)   Physical Exam: HEENT:  Sclerae anicteric.  Oropharynx clear and moist. No ulcerations, and no evidence of oropharyngeal candidiasis. Nasal mucosa is mildly erythematous and edematous bilaterally. Neck is supple.  NODES:  No cervical or supraclavicular lymphadenopathy palpated.  BREAST EXAM: Decrease in the size of the  right breast mass was noted with no tenderness. The palpable nodules in the lateral portion of the breast do appear somewhat softer with palpation. There is a palpable nodule in the right axilla.  LUNGS:  Clear to auscultation bilaterally.  No wheezes or rhonchi HEART:  Regular rate and rhythm. No murmur appreciated ABDOMEN:  Soft, nontender. No hepatomegaly palpated.  Positive bowel sounds.  MSK:  No focal spinal tenderness to palpation. Good range of motion bilaterally in the upper extremities. EXTREMITIES:  No peripheral edema.   SKIN:  Benign with no visible rashes or skin lesions. Port is intact in the chest wall with no erythema or edema, and no evidence of infection/cellulitis. NEURO:  Nonfocal. Well oriented.  Appropriate affect.   LAB RESULTS:   Lab Results  Component Value Date   WBC 13.8* 06/17/2013   NEUTROABS 11.3* 06/17/2013   HGB 10.1* 06/17/2013   HCT 30.9* 06/17/2013   MCV 90.1 06/17/2013   PLT 245 06/17/2013      Chemistry  Component Value Date/Time   NA 135* 06/10/2013 1106   NA 136 02/28/2013 2230   K 4.5 06/10/2013 1106   K 3.4* 02/28/2013 2230   CL 99 02/28/2013 2230   CO2 25 06/10/2013 1106   CO2 28 02/28/2013 2230   BUN 17.9 06/10/2013 1106   BUN 8 02/28/2013 2230   CREATININE 0.7 06/10/2013 1106   CREATININE 0.59 02/28/2013 2230      Component Value Date/Time   CALCIUM 9.6 06/10/2013 1106   CALCIUM 9.0 02/28/2013 2230   ALKPHOS 125 06/10/2013 1106   ALKPHOS 102 02/28/2013 2230   AST 21 06/10/2013 1106   AST 19 02/28/2013 2230   ALT 31 06/10/2013 1106   ALT 18 02/28/2013 2230   BILITOT 0.70 06/10/2013 1106   BILITOT 0.3 02/28/2013 2230        STUDIES:  Ct Chest W Contrast  05/16/2013   CLINICAL DATA:  Followup of breast cancer. Diagnosed 8/13. Chemotherapy ongoing.  EXAM: CT CHEST WITH CONTRAST  TECHNIQUE: Multidetector CT imaging of the chest was performed during intravenous contrast administration.  CONTRAST:  88mL OMNIPAQUE IOHEXOL 300 MG/ML  SOLN  COMPARISON:  03/17/2013  FINDINGS: Lungs/Pleura: Mild progression of pulmonary metastasis. Index right lower lobe nodule measures 6 mm on image 29 today versus 5 mm on the prior.  Posterior left upper lobe 8 mm nodule on image 20/series 5 is increased from 6 mm on the prior.  Index right lower lobe nodule measures 12 mm on image 25 versus 9 mm on the prior.  No pleural fluid.  Heart/Mediastinum: A right-sided Port-A-Cath which terminates at the mid SVC.  Right axillary node which measures 1.5 cm on image 23 versus 1.6 cm on the prior.  Right breast mass which measures 5.6 x 7.8 cm on image 38 and has enlarged from 4.7 x 4.7 cm on the prior.  No left axillary adenopathy. Normal heart size, without pericardial effusion. No mediastinal or hilar adenopathy.  Upper Abdomen: Progressive hepatic metastasis. Index posterior segment right hepatic lobe 2.1 cm lesion on image 59 measured 1.7 cm on the prior.  A medial segment left liver lobe 1.7 cm lesion on image 64  measured 1.4 cm on the prior.  Normal adrenal glands.  Bones/Musculoskeletal:  No acute osseous abnormality.  IMPRESSION: 1. Progressive metastatic disease, including within the liver and lungs. 2. Primary breast mass is also enlarged since the prior. 3. Right axillary adenopathy is similar to minimally improved.   Electronically Signed   By: Abigail Miyamoto M.D.   On: 05/16/2013 09:21     Transthoracic Echocardiography  Patient: Leelynn, Twidwell MR #: [id number] Study Date: 05/02/2013 Gender: F Age: 63 Height: 162.6cm Weight: 60.8kg BSA: 1.34m^2 Pt. Status: Room:  PERFORMING [place], [place], Amy SONOGRAPHER Mauricio Po, RDCS, CCT cc:  ------------------------------------------------------------ LV EF: 55% - 60%  ------------------------------------------------------------ History: PMH: Breast Cancer   ASSESSMENT: 63 y.o. [name], [place] woman status post right breast upper outer quadrant and right axillary lymph node biopsy 12/23/2012 for a clinical T3 N1 M1, stage IV invasive ductal carcinoma, grade 3, triple negative, with an MIB-1 of 86%.  (1) right liver lobe biopsy 01/21/2013 confirms metastatic adenocarcinoma; scans show involvement of the liver, lungs, and likely bone.  (2) enrolled in [facility] study [id number] (docetaxel + oral gamma secretase inibitor [id number]), received one cycle starting 02/04/2013  but withdrew because of poor tolerance despite treatment interruptions and decreased dosing of the oral component  (3)  chest CT  in early November 2014 was compared with studies in August 2014 and showed interval progression of metastatic breast cancer, with an enlarging primary right breast mass, enlarging right axillary lymph nodes, enlarging pulmonary nodules and new/enlarging hepatic metastases.  (4) Abraxane started 03/19/2013, given day 1 and day 8 of each 21 day cycle, stopped with the day 1 cycle 3 dose (04/29/2013) because of  local progression of disease  (5) cyclophosphamide and doxorubicin started 05/20/2013, given in dose dense fashion  with Neulasta support on day #2.  The plan is to complete 4 cycles, to be followed by definitive local treatment.  (6) leukopenia with severe neutropenia secondary to chemotherapy-resolved  (7) nausea and vomiting -resolved  (8) fatigue secondary to chemotherapy-improved  PLAN: #1 we'll proceed with cycle 3 chemotherapy with dose dense AC today. Her labs are acceptable for chemotherapy #2  Follow up in one week for labs, physician visit and for IV fluids if needed .  #3 I have reiterated the patient on to take Zofran 8 mg po every 8 hours and to alternate with compazine 10 mg po every 8 hours for nausea and vomiting #4 I have  instructed the patient to use Biotene mouthwash every 6 hours prn   All questions were answered. The patient knows to call the clinic with any problems, questions or concerns. We can certainly see the patient much sooner if necessary.  I spent 20 minutes counseling the patient face to face. The total time spent in the appointment was 30 minutes.   Wilmon Arms, MD  Medical Oncology   06/17/2013 10:58 AM

## 2013-06-17 NOTE — Telephone Encounter (Signed)
, °

## 2013-06-17 NOTE — Telephone Encounter (Signed)
Per staff message and POF I have scheduled appts. Advised scheduler first available is 3pm JMW

## 2013-06-17 NOTE — Patient Instructions (Signed)
[facility] Discharge Instructions for Patients Receiving Chemotherapy  Today you received the following chemotherapy agents: Doxorubicin, Cytoxan  To help prevent nausea and vomiting after your treatment, we encourage you to take your nausea medication as prescribed by your physician.    If you develop nausea and vomiting that is not controlled by your nausea medication, call the clinic.   BELOW ARE SYMPTOMS THAT SHOULD BE REPORTED IMMEDIATELY:  *FEVER GREATER THAN 100.5 F  *CHILLS WITH OR WITHOUT FEVER  NAUSEA AND VOMITING THAT IS NOT CONTROLLED WITH YOUR NAUSEA MEDICATION  *UNUSUAL SHORTNESS OF BREATH  *UNUSUAL BRUISING OR BLEEDING  TENDERNESS IN MOUTH AND THROAT WITH OR WITHOUT PRESENCE OF ULCERS  *URINARY PROBLEMS  *BOWEL PROBLEMS  UNUSUAL RASH Items with * indicate a potential emergency and should be followed up as soon as possible.  Feel free to call the clinic you have any questions or concerns. The clinic phone number is [phone].    

## 2013-06-18 ENCOUNTER — Ambulatory Visit (HOSPITAL_BASED_OUTPATIENT_CLINIC_OR_DEPARTMENT_OTHER): Payer: BC Managed Care – PPO

## 2013-06-18 VITALS — BP 125/62 | HR 112 | Temp 98.7°F

## 2013-06-18 DIAGNOSIS — C50411 Malignant neoplasm of upper-outer quadrant of right female breast: Secondary | ICD-10-CM

## 2013-06-18 DIAGNOSIS — C50419 Malignant neoplasm of upper-outer quadrant of unspecified female breast: Secondary | ICD-10-CM

## 2013-06-18 DIAGNOSIS — Z5189 Encounter for other specified aftercare: Secondary | ICD-10-CM

## 2013-06-18 MED ORDER — PEGFILGRASTIM INJECTION 6 MG/0.6ML
6.0000 mg | Freq: Once | SUBCUTANEOUS | Status: AC
  Administered 2013-06-18: 6 mg via SUBCUTANEOUS
  Filled 2013-06-18: qty 0.6

## 2013-06-23 ENCOUNTER — Emergency Department (HOSPITAL_COMMUNITY): Payer: BC Managed Care – PPO

## 2013-06-23 ENCOUNTER — Emergency Department (HOSPITAL_COMMUNITY)
Admission: EM | Admit: 2013-06-23 | Discharge: 2013-06-23 | Disposition: A | Discharge status: Home / Self Care | Payer: BC Managed Care – PPO | Attending: Emergency Medicine | Admitting: Emergency Medicine

## 2013-06-23 ENCOUNTER — Encounter (HOSPITAL_COMMUNITY): Payer: Self-pay | Admitting: Emergency Medicine

## 2013-06-23 DIAGNOSIS — R5381 Other malaise: Secondary | ICD-10-CM | POA: Insufficient documentation

## 2013-06-23 DIAGNOSIS — M129 Arthropathy, unspecified: Secondary | ICD-10-CM | POA: Insufficient documentation

## 2013-06-23 DIAGNOSIS — E860 Dehydration: Secondary | ICD-10-CM | POA: Insufficient documentation

## 2013-06-23 DIAGNOSIS — Z862 Personal history of diseases of the blood and blood-forming organs and certain disorders involving the immune mechanism: Secondary | ICD-10-CM | POA: Insufficient documentation

## 2013-06-23 DIAGNOSIS — Z8709 Personal history of other diseases of the respiratory system: Secondary | ICD-10-CM | POA: Insufficient documentation

## 2013-06-23 DIAGNOSIS — C787 Secondary malignant neoplasm of liver and intrahepatic bile duct: Secondary | ICD-10-CM | POA: Insufficient documentation

## 2013-06-23 DIAGNOSIS — R509 Fever, unspecified: Secondary | ICD-10-CM | POA: Insufficient documentation

## 2013-06-23 DIAGNOSIS — Z792 Long term (current) use of antibiotics: Secondary | ICD-10-CM | POA: Insufficient documentation

## 2013-06-23 DIAGNOSIS — R5383 Other fatigue: Secondary | ICD-10-CM

## 2013-06-23 DIAGNOSIS — F411 Generalized anxiety disorder: Secondary | ICD-10-CM | POA: Insufficient documentation

## 2013-06-23 DIAGNOSIS — R110 Nausea: Secondary | ICD-10-CM

## 2013-06-23 DIAGNOSIS — Z9851 Tubal ligation status: Secondary | ICD-10-CM | POA: Insufficient documentation

## 2013-06-23 DIAGNOSIS — IMO0002 Reserved for concepts with insufficient information to code with codable children: Secondary | ICD-10-CM | POA: Insufficient documentation

## 2013-06-23 DIAGNOSIS — Z880 Allergy status to penicillin: Secondary | ICD-10-CM | POA: Insufficient documentation

## 2013-06-23 DIAGNOSIS — R112 Nausea with vomiting, unspecified: Secondary | ICD-10-CM | POA: Insufficient documentation

## 2013-06-23 DIAGNOSIS — C7800 Secondary malignant neoplasm of unspecified lung: Secondary | ICD-10-CM | POA: Insufficient documentation

## 2013-06-23 DIAGNOSIS — R109 Unspecified abdominal pain: Secondary | ICD-10-CM | POA: Insufficient documentation

## 2013-06-23 DIAGNOSIS — K219 Gastro-esophageal reflux disease without esophagitis: Secondary | ICD-10-CM | POA: Insufficient documentation

## 2013-06-23 DIAGNOSIS — C50919 Malignant neoplasm of unspecified site of unspecified female breast: Secondary | ICD-10-CM | POA: Insufficient documentation

## 2013-06-23 DIAGNOSIS — Z79899 Other long term (current) drug therapy: Secondary | ICD-10-CM | POA: Insufficient documentation

## 2013-06-23 LAB — COMPREHENSIVE METABOLIC PANEL
ALT: 21 U/L (ref 0–35)
AST: 17 U/L (ref 0–37)
Albumin: 3.7 g/dL (ref 3.5–5.2)
Alkaline Phosphatase: 138 U/L — ABNORMAL HIGH (ref 39–117)
BUN: 15 mg/dL (ref 6–23)
CALCIUM: 9 mg/dL (ref 8.4–10.5)
CO2: 26 mEq/L (ref 19–32)
Chloride: 94 mEq/L — ABNORMAL LOW (ref 96–112)
Creatinine, Ser: 0.67 mg/dL (ref 0.50–1.10)
GFR calc Af Amer: >90 mL/min (ref >90)
GFR calc non Af Amer: >90 mL/min (ref >90)
Glucose, Bld: 99 mg/dL (ref 70–99)
Potassium: 4.1 mEq/L (ref 3.7–5.3)
Sodium: 133 mEq/L — ABNORMAL LOW (ref 137–147)
TOTAL PROTEIN: 6.8 g/dL (ref 6.0–8.3)
Total Bilirubin: 0.5 mg/dL (ref 0.3–1.2)

## 2013-06-23 LAB — CBC WITH DIFFERENTIAL/PLATELET
Basophils Absolute: 0 10*3/uL (ref 0.0–0.1)
Basophils Relative: 7 % — ABNORMAL HIGH (ref 0–1)
EOS ABS: 0 10*3/uL (ref 0.0–0.7)
Eosinophils Relative: 2 % (ref 0–5)
HCT: 31.3 % — ABNORMAL LOW (ref 36.0–46.0)
Hemoglobin: 10.6 g/dL — ABNORMAL LOW (ref 12.0–15.0)
Lymphocytes Relative: 70 % — ABNORMAL HIGH (ref 12–46)
Lymphs Abs: 0.3 10*3/uL — ABNORMAL LOW (ref 0.7–4.0)
MCH: 29.7 pg (ref 26.0–34.0)
MCHC: 33.9 g/dL (ref 30.0–36.0)
MCV: 87.7 fL (ref 78.0–100.0)
Monocytes Absolute: 0 10*3/uL — ABNORMAL LOW (ref 0.1–1.0)
Monocytes Relative: 5 % (ref 3–12)
Neutro Abs: 0.1 10*3/uL — ABNORMAL LOW (ref 1.7–7.7)
Neutrophils Relative %: 16 % — ABNORMAL LOW (ref 43–77)
PLATELETS: 181 10*3/uL (ref 150–400)
RBC: 3.57 MIL/uL — ABNORMAL LOW (ref 3.87–5.11)
RDW: 14.7 % (ref 11.5–15.5)
WBC: 0.4 10*3/uL — AB (ref 4.0–10.5)

## 2013-06-23 LAB — URINALYSIS, ROUTINE W REFLEX MICROSCOPIC
Bilirubin Urine: NEGATIVE
GLUCOSE, UA: NEGATIVE mg/dL
Hgb urine dipstick: NEGATIVE
Ketones, ur: NEGATIVE mg/dL
Leukocytes, UA: NEGATIVE
Nitrite: NEGATIVE
PH: 6.5 (ref 5.0–8.0)
PROTEIN: NEGATIVE mg/dL
Specific Gravity, Urine: 1.011 (ref 1.005–1.030)
Urobilinogen, UA: 0.2 mg/dL (ref 0.0–1.0)

## 2013-06-23 LAB — LIPASE, BLOOD: LIPASE: 21 U/L (ref 11–59)

## 2013-06-23 MED ORDER — SODIUM CHLORIDE 0.9 % IV BOLUS (SEPSIS)
1000.0000 mL | Freq: Once | INTRAVENOUS | Status: AC
  Administered 2013-06-23: 1000 mL via INTRAVENOUS

## 2013-06-23 MED ORDER — ONDANSETRON 4 MG PO TBDP
4.0000 mg | ORAL_TABLET | Freq: Once | ORAL | Status: AC
  Administered 2013-06-23: 4 mg via ORAL
  Filled 2013-06-23: qty 1

## 2013-06-23 MED ORDER — ONDANSETRON 4 MG PO TBDP: ORAL_TABLET | ORAL | Status: DC

## 2013-06-23 NOTE — ED Provider Notes (Signed)
CSN: [id number]     Arrival date & time 06/23/13  1741 History   First MD Initiated Contact with Patient 06/23/13 1819     Chief Complaint  Patient presents with  . Emesis  . Abdominal Pain     (Consider location/radiation/quality/duration/timing/severity/associated sxs/prior Treatment) The history is provided by the patient. No language interpreter was used.  Carrie Berry is a 63 y/o F with PMHx of breast cancer stage IV with mets to the liver and lungs currently on chemotherapy once every other week seen by Dr. Marjie Skiff at [facility], anxiety, GERD presenting to the ED with nausea, vomiting, abdominal pain that started Friday. Patient reported that Saturday was her worst day, that she was unable to keep anything down - reported that she had 3 episodes of emesis mainly of food contents. Patient reported that she has been having feelings of weakness. Reported that she has been experiencing abdominal pain described as a "rolling" and "gurgling" sensation. Reported that she has been having episodes of gagging and weakness today. Reported that she felt like she had to have an episode of emesis, but has not. Reported that she was able to take medications today, reported at 12:30PM, reported that the medications stayed down. Patient reported that she has been having normal BM, urination, and passing of flatulence. Stated that she had chemo session last Tuesday and IV fluids were administered afterwards and patient felt fine. Reported that she is due to see her provider tomorrow at [facility]. Reported highest fever of 99 F. Denied chest pain, shortness of breath, difficulty breathing, chills, sweating, melena, hematochezia, diarrhea. PCP Dr. Delfina Redwood  Past Medical History  Diagnosis Date  . Recurrent sinus infections   . Arthritis   . PONV (postoperative nausea and vomiting)   . Anxiety   . Cancer     breast  . Breast cancer   . GERD (gastroesophageal reflux disease)    Past  Surgical History  Procedure Laterality Date  . Tubal ligation    . Tubal ligation  1985  . Portacath placement N/A 01/10/2013    Procedure: ULTRASOUND GUIDED PORT-A-CATH INSERTION WITH FLUOROSCOPY;  Surgeon: [name], MD;  Location: [facility];  Service: General;  Laterality: N/A;   Family History  Problem Relation Age of Onset  . Bladder Cancer Father   . Colon cancer Maternal Grandmother    History  Substance Use Topics  . Smoking status: Never Smoker   . Smokeless tobacco: Never Used  . Alcohol Use: No   OB History   Grav Para Term Preterm Abortions TAB SAB Ect Mult Living                 Review of Systems  Constitutional: Positive for fever and chills.  Respiratory: Negative for chest tightness and shortness of breath.   Cardiovascular: Negative for chest pain.  Gastrointestinal: Positive for nausea, vomiting and abdominal pain. Negative for diarrhea, constipation, blood in stool and anal bleeding.  Genitourinary: Negative for dysuria and decreased urine volume.  Musculoskeletal: Negative for back pain and neck pain.  Neurological: Positive for weakness. Negative for dizziness.  All other systems reviewed and are negative.      Allergies  Ondansetron hcl; Codeine; and Penicillins  Home Medications   Current Outpatient Rx  Name  Route  Sig  Dispense  Refill  . acetaminophen (TYLENOL) 325 MG tablet   Oral   Take 650 mg by mouth every 8 (eight) hours as needed for pain.         Marland Kitchen  aspirin-acetaminophen-caffeine (EXCEDRIN MIGRAINE) 250-250-65 MG per tablet   Oral   Take 1 tablet by mouth every 6 (six) hours as needed for headache.         . lidocaine-prilocaine (EMLA) cream   Topical   Apply topically as needed.   30 g   3   . omeprazole (PRILOSEC) 20 MG capsule   Oral   Take 1 capsule (20 mg total) by mouth 2 (two) times daily.   60 capsule   3   . ondansetron (ZOFRAN) 8 MG tablet   Oral   Take 8 mg by mouth every 8 (eight) hours as needed for  nausea or vomiting.         Marland Kitchen PATADAY 0.2 % SOLN   Both Eyes   Place 1 drop into both eyes daily as needed (dry eyes).          . prochlorperazine (COMPAZINE) 10 MG tablet   Oral   Take 1 tablet (10 mg total) by mouth every 6 (six) hours as needed for nausea.   30 tablet   2   . tobramycin-dexamethasone (TOBRADEX) ophthalmic solution   Both Eyes   Place 1 drop into both eyes daily.   5 mL   1   . traMADol (ULTRAM) 50 MG tablet   Oral   Take 1 tablet (50 mg total) by mouth every 6 (six) hours as needed for moderate pain.   60 tablet   0   . traZODone (DESYREL) 50 MG tablet   Oral   Take 100 mg by mouth. Every other night at bedtime as needed  for sleep         . triamcinolone (NASACORT) 55 MCG/ACT nasal inhaler   Nasal   Place 2 sprays into the nose daily.         Marland Kitchen zolpidem (AMBIEN) 5 MG tablet   Oral   Take 5 mg by mouth. Every other night at bedtime as needed for sleep         . dexamethasone (DECADRON) 4 MG tablet      Take 2 tablets by mouth once a day on the day after chemotherapy and then take 2 tablets two times a day for 2 days. Take with food.   30 tablet   1    BP 115/71  Pulse 112  Temp(Src) 99 F (37.2 C) (Oral)  Resp 18  SpO2 100% Physical Exam  Nursing note and vitals reviewed. Constitutional: She is oriented to person, place, and time. She appears well-developed and well-nourished. No distress.  HENT:  Head: Normocephalic and atraumatic.  Mouth/Throat: No oropharyngeal exudate.  Dry mucus membranes  Eyes: Conjunctivae and EOM are normal. Pupils are equal, round, and reactive to light. Right eye exhibits no discharge. Left eye exhibits no discharge.  Neck: Normal range of motion. Neck supple. No tracheal deviation present.  Cardiovascular: Normal rate, regular rhythm and normal heart sounds.  Exam reveals no friction rub.   No murmur heard. Pulses:      Radial pulses are 2+ on the right side, and 2+ on the left side.       Dorsalis  pedis pulses are 2+ on the right side, and 2+ on the left side.  Pulmonary/Chest: Effort normal and breath sounds normal. No respiratory distress. She has no wheezes. She has no rales.  Abdominal: Soft. Normal appearance and bowel sounds are normal. There is no tenderness. There is no guarding.    Mild discomfort upon palpation to the  lower abdomen - right and left quadrants Negative acute abdomen, negative peritoneal signs  Musculoskeletal: Normal range of motion.  Full ROM to upper and lower extremities without difficulty noted, negative ataxia noted.  Lymphadenopathy:    She has no cervical adenopathy.  Neurological: She is alert and oriented to person, place, and time. She exhibits normal muscle tone. Coordination normal.  Cranial nerves III-XII grossly intact Strength 5+/5+ to upper and lower extremities bilaterally with resistance applied, equal distribution noted  Skin: Skin is warm and dry. No rash noted. She is not diaphoretic. No erythema.  Psychiatric: She has a normal mood and affect. Her behavior is normal. Thought content normal.    ED Course  Procedures (including critical care time) Labs Review Labs Reviewed  CBC WITH DIFFERENTIAL  COMPREHENSIVE METABOLIC PANEL  LIPASE, BLOOD   Imaging Review No results found.  EKG Interpretation   None       MDM   Final diagnoses:  None   Medications  sodium chloride 0.9 % bolus 1,000 mL (0 mLs Intravenous Stopped 06/23/13 1933)  ondansetron (ZOFRAN-ODT) disintegrating tablet 4 mg (4 mg Oral Given 06/23/13 2122)   Filed Vitals:   06/23/13 1810 06/23/13 2230  BP: 115/71 102/60  Pulse: 112 91  Temp: 99 F (37.2 C) 99.2 F (37.3 C)  TempSrc: Oral Oral  Resp: 18 16  SpO2: 100% 98%    Patient presenting to emergency Department nausea, vomiting, abdominal pain that started Friday. Patient reported that her worst episode was Saturday which had approximately 3 episodes of emesis mainly of food contents. Stated that she's  been nausea with decreased intake secondary to nausea. Stated that she was able to take medications for nausea today at approximately 12:30 PM - stated that they've stayed down. Reported that she's been having abdominal pain described as a "pulling" and "gurgling" sensation is been constant. Reported that she appointment with her primary doctor tomorrow. Denied having a fever greater than 100.4 F - reported normally 99-99.49F.  Alert and oriented. GCS 15. Dry mucous membranes. Heart rate and rhythm normal. Lungs clear to auscultation. Cap refill less than 3 seconds. Radial pulses 2+ and DP pulses 2+ bilaterally. Bowel sounds normoactive in all 4 quadrants. Mild discomfort upon palpation to the lower abdomen-right lower quadrant and left lower quadrant-negative acute abdomen, negative peritoneal signs. Nonsurgical abdomen noted. CBC noted decrease in white blood cell count of 0.4-neutropenia 16-patient recently had chemotherapy last Tuesday. CMP noted mild hyponatremia of 133 and low chloride level of 94-anion gap of 13.0 mEq per liter. Urinalysis negative for infection-negative nitrites or leukocytes noted. Lipase negative findings. Abdominal x-ray noted-negative acute findings of chest or abdomen-negative findings of small bowel obstruction. Pulmonary nodules consistent with metastatic breast cancer noted on prior CT. Negative acute abnormalities noted. Doubt SBO. Doubt acute abdominal processes. Doubt UTI. Doubt pyelonephritis. Doubt PID. Doubt GYN issue. Negative episodes of emesis while in ED setting. Suspicion to be dehydration and nausea secondary to chemotherapy. Discussed labs, imaging, vitals in great detail with attending physician who cleared patient for discharge. Patient stable, afebrile. Discharged patient. Discussed with patient to be re-assessed with primary physician within 24 hours. Discussed with patient to stay hydrated. Discharged patient with zofran. Discussed with patient to continue to  monitor her fever and if her fever increases that she is to report back to the ED. Discussed with patient to closely monitor symptoms and if symptoms are to worsen or change to report back to the ED - strict return instructions given.  Patient agreed to plan of care, understood, all questions answered.  Jamse Mead, PA-C 06/24/13 [phone]

## 2013-06-23 NOTE — ED Notes (Signed)
Pt states she has had emesis and abd for past few days. Pt states she called oncologist and was told to come here for IV fluids. Last chemo on Tuesday, was schedule to have IV fluids tomarrow. Pt states she has had to have IV fluids after chemo before.

## 2013-06-23 NOTE — Discharge Instructions (Signed)
Please call your doctor for a followup appointment within 24-48 hours. When you talk to your doctor please let them know that you were seen in the emergency department and have them acquire all of your records so that they can discuss the findings with you and formulate a treatment plan to fully care for your new and ongoing problems. Please call and set-up an appointment with your primary care provider to be re-assessed regarding findings Please rest and stay hydrated Please keep appointment with oncologist tomorrow Please take medications at home as prescribed Please avoid any strenuous activity Please continue to monitor symptoms closely and if symptoms are to worsen or change (fever greater than 101, chills, sweating, nausea, vomiting, diarrhea, stomach pain, inability to keep food or fluids down, black tarry stools, bright red bloody stools, chest pain, shortness of breath, difficulty breathing, numbness, tingling) please report back to emergency department immediately   Dehydration, Adult Dehydration is when you lose more fluids from the body than you take in. Vital organs like the kidneys, brain, and heart cannot function without a proper amount of fluids and salt. Any loss of fluids from the body can cause dehydration.  CAUSES   Vomiting.  Diarrhea.  Excessive sweating.  Excessive urine output.  Fever. SYMPTOMS  Mild dehydration  Thirst.  Dry lips.  Slightly dry mouth. Moderate dehydration  Very dry mouth.  Sunken eyes.  Skin does not bounce back quickly when lightly pinched and released.  Dark urine and decreased urine production.  Decreased tear production.  Headache. Severe dehydration  Very dry mouth.  Extreme thirst.  Rapid, weak pulse (more than 100 beats per minute at rest).  Cold hands and feet.  Not able to sweat in spite of heat and temperature.  Rapid breathing.  Blue lips.  Confusion and lethargy.  Difficulty being awakened.  Minimal  urine production.  No tears. DIAGNOSIS  Your caregiver will diagnose dehydration based on your symptoms and your exam. Blood and urine tests will help confirm the diagnosis. The diagnostic evaluation should also identify the cause of dehydration. TREATMENT  Treatment of mild or moderate dehydration can often be done at home by increasing the amount of fluids that you drink. It is best to drink small amounts of fluid more often. Drinking too much at one time can make vomiting worse. Refer to the home care instructions below. Severe dehydration needs to be treated at the hospital where you will probably be given intravenous (IV) fluids that contain water and electrolytes. HOME CARE INSTRUCTIONS   Ask your caregiver about specific rehydration instructions.  Drink enough fluids to keep your urine clear or pale yellow.  Drink small amounts frequently if you have nausea and vomiting.  Eat as you normally do.  Avoid:  Foods or drinks high in sugar.  Carbonated drinks.  Juice.  Extremely hot or cold fluids.  Drinks with caffeine.  Fatty, greasy foods.  Alcohol.  Tobacco.  Overeating.  Gelatin desserts.  Wash your hands well to avoid spreading bacteria and viruses.  Only take over-the-counter or prescription medicines for pain, discomfort, or fever as directed by your caregiver.  Ask your caregiver if you should continue all prescribed and over-the-counter medicines.  Keep all follow-up appointments with your caregiver. SEEK MEDICAL CARE IF:  You have abdominal pain and it increases or stays in one area (localizes).  You have a rash, stiff neck, or severe headache.  You are irritable, sleepy, or difficult to awaken.  You are weak, dizzy, or extremely  thirsty. SEEK IMMEDIATE MEDICAL CARE IF:   You are unable to keep fluids down or you get worse despite treatment.  You have frequent episodes of vomiting or diarrhea.  You have blood or green matter (bile) in your  vomit.  You have blood in your stool or your stool looks black and tarry.  You have not urinated in 6 to 8 hours, or you have only urinated a small amount of very dark urine.  You have a fever.  You faint. MAKE SURE YOU:   Understand these instructions.  Will watch your condition.  Will get help right away if you are not doing well or get worse. Document Released: 05/01/2005 Document Revised: 07/24/2011 Document Reviewed: 12/19/2010 [facility] Patient Information 2014 [place], [place].   Emergency Department Resource Guide 1) Find a Doctor and Pay Out of Pocket Although you won't have to find out who is covered by your insurance plan, it is a good idea to ask around and get recommendations. You will then need to call the office and see if the doctor you have chosen will accept you as a new patient and what types of options they offer for patients who are self-pay. Some doctors offer discounts or will set up payment plans for their patients who do not have insurance, but you will need to ask so you aren't surprised when you get to your appointment.  2) Contact Your Local Health Department Not all health departments have doctors that can see patients for sick visits, but many do, so it is worth a call to see if yours does. If you don't know where your local health department is, you can check in your phone book. The CDC also has a tool to help you locate your state's health department, and many state websites also have listings of all of their local health departments.  3) Find a [facility] If your illness is not likely to be very severe or complicated, you may want to try a walk in clinic. These are popping up all over the country in pharmacies, drugstores, and shopping centers. They're usually staffed by nurse practitioners or physician assistants that have been trained to treat common illnesses and complaints. They're usually fairly quick and inexpensive. However, if you have serious  medical issues or chronic medical problems, these are probably not your best option.  No Primary Care Doctor: - Call Health Connect at  [phone] - they can help you locate a primary care doctor that  accepts your insurance, provides certain services, etc. - Physician Referral Service- [phone]  Chronic Pain Problems: Organization         Address  Phone   Notes  [facility]  [phone] Patients need to be referred by their primary care doctor.   Medication Assistance: Organization         Address  Phone   Notes  [facility] Medication [facility] [address] [place], [place] [address] [phone] --Must be a resident of [facility] -- Must have NO insurance coverage whatsoever (no Medicaid/ Medicare, etc.) -- The pt. MUST have a primary care doctor that directs their care regularly and follows them in the community   [facility]  [phone]   [employer]  [phone]    Agencies that provide inexpensive medical care: Organization         Address  Phone   Notes  [place]  [phone]   [facility]    [phone]   [facility] [place], [place] [address] [phone]   [place] [address] [place] [phone]   Planned Parenthood    [phone]   [facility]    [phone]   [place] and [address], [place] Phone:  [phone], Fax:  [phone] Hours of Operation:  9 am - 6 pm, M-F.  Also accepts Medicaid/Medicare and self-pay.  [facility] for [place] [address], [address], [place] Phone: [phone], Fax: [phone]. Hours of Operation:  8:30 am - 5:30 pm, M-F.  Also accepts Medicaid and self-pay.  [facility] [place] [address] [place] Phone: [phone]   [place], [place], [place] [phone], Ext. [phone] Mondays & Thursdays: 7-9 AM.  First 15 patients are seen on a first come, first serve basis.    [place] Providers:  Organization         Address  Phone   Notes  [facility] [address],  [phone] Also accepts self-pay patients.  [facility] [address], [place]  [phone]   [place], [address], [place] [phone]   [facility] [address] [place] [phone]   Lucianne Lei [address], [place]   [phone] Only accepts [place] Access [place] patients after they have their name applied to their card.   Self-Pay (no insurance) in [facility]:  Organization         Address  Phone   Notes  Sickle Cell Patients, [facility] [place] [phone]   [facility] Urgent Care [place] [phone]   [facility] [place]  [place], [place], [place] [phone]   [facility]/Dr. Osei-Bonsu  [address], [place] or [address], [place] [phone] Phone number for both [place] and [place] locations is the same.  [facility] [address] [place] [phone]   [facility] [address], [place] or [address] [phone]   [facility] [address], [place] [phone], phone; [phone], fax Sees patients 1st and 3rd Saturday of every month.  Must not qualify for public or private insurance (i.e. Medicaid, Medicare, [place] Health Choice, Veterans' Benefits)  Household income should be no more than 200% of the poverty level The clinic cannot treat you if you are pregnant or think you are pregnant  Sexually  transmitted diseases are not treated at the clinic.    Dental Care: Organization         Address  Phone  Notes  [facility] Department of [facility] [place] [phone] Accepts children up to age 18 who are enrolled in [place] or [place]; pregnant women with a Medicaid card; and children who have applied for Medicaid or [place] Health Choice, but were declined, whose parents can pay a reduced fee at time of service.  [facility]  [address], [employer] [phone])  [phone] Accepts children up to age 53 who are enrolled in Medicaid or [place] Health Choice; pregnant women with a Medicaid card; and children who have applied for Medicaid or  Health Choice, but were declined, whose parents can pay a reduced fee at time of service.  [place] Adult Dental Access PROGRAM  [place] [phone] Patients are seen by appointment only. Walk-ins are not accepted. [place] will see patients 49 years of age and older. Monday - Tuesday (8am-5pm) Most Wednesdays (8:30-5pm) $30 per visit, cash only  [facility] Adult Dental Access PROGRAM  [address], [facility] [phone] Patients are seen by appointment only. Walk-ins are not accepted. [place] will see patients 12 years of age and older. One Wednesday Evening (Monthly: Volunteer Based).  $30 per visit, cash only  [place]  [phone] for adults; Children under age 36, call Graduate Pediatric Dentistry at [phone]. Children aged 32-14, please call [phone] to request a pediatric application.  Dental services are provided in all areas of dental care including fillings, crowns and bridges, complete and partial dentures, implants, gum treatment, root canals, and extractions. Preventive care is also provided. Treatment is provided to both adults and children. Patients are  selected via a lottery and there is often a waiting list.   [facility] [address] [place]  [phone] [email or web address]   [facility] [address] [place], [place] [phone], Ext. [phone] Second and Fourth Thursday of each month, opens at 6:30 AM; Clinic ends at 9 AM.  Patients are seen on a first-come first-served basis, and a limited number are seen during each clinic.   [facility]  [address] Hillard Danker [place], [place] [phone]   Eligibility Requirements You must have lived in [place], [place], or [place] counties for at least the last three months.   You cannot be eligible for state or federal sponsored [employer], including [employer], [place], or [employer].   You generally cannot be eligible for healthcare insurance through your employer.    How to apply: Eligibility screenings are held every Tuesday and Wednesday afternoon from 1:00 pm until 4:00 pm. You do not need an appointment for the interview!  [facility] [address] [place], [place]   [place]  [place] Department  [place]  [phone]    Behavioral Health Resources in the Community: Intensive Outpatient Programs Organization         Address  Phone  Notes  [place] [place]. [address] [place], [place] [phone]   [facility] Outpatient [address] [place], [place]   ADS: [facility] [address] [place], [place]   [place] [address],  [place], [place] or [phone]   Substance Abuse Resources Organization         Address  Phone  Notes  Al[facility] Drug Services  [phone]   [place]  ([phone]   [facility]   Chinita Pester  [phone]     Residential & Outpatient Substance Abuse Program  [phone]   Psychological Services Organization         Address  Phone  Notes  [place]  [place]  [phone]  [place] [address], [place] or [phone]    Mobile Crisis Teams Organization         Address  Phone  Notes  [facility], Mobile Crisis Care Unit  [phone]   [facility]  [address] [place], [place]   [place] Levels [address] [place] [phone]    Self-Help/Support Groups Organization         Address  Phone             Notes  [place]. of [place] - variety of support groups  [place] Call for more information  Narcotics Anonymous (NA), [facility] [address], [employer] [place]  2 meetings at this location   Special educational needs teacher         Address  Phone  Notes  [facility]idential [facility] [place],    [place]  [phone]   [facility]  [address] [place] [id number], [place], [place]   [place], [place] [phone] Admissions: 8am-3pm M-F  Incentives Substance [place] [address],    [place], [place] [phone]   [facility] [address] [place], [place], [place]   [facility] [address],  [place], [place]   [facility] - Intensive Outpatient [address] Kristeen [address], [place], [place]   [facility] [place].,  [place], [place] [phone] or [phone]   [facility] Services (RTS) [address], [place], [place] Accepts Medicaid  [facility] [place] [address]  [place] [phone] Substance Abuse/Addiction Treatment   [facility] Organization         Address  Phone  Notes  [facility]  [phone]   Domenic Schwab, PhD [address] Arlis Port[place], [place]   [phone] or [phone]   [place], [place] [phone]   [facility] [address], [place], [place] [phone] Insurance/Medicaid/sponsorship through [facility] and Families [address], [address] [place]                                    [place], [place] [phone] [place] [address], [place] [phone]    Dr. Adele Schilder  [phone]   [facility] Dept. 1) [address] [place] 2) [place] 3)  [place] [address], [place] [phone]  [phone]   [place] [phone] or [phone] (After Hours)

## 2013-06-23 NOTE — ED Notes (Signed)
Critical WBC of 0.4 called by lab-Kari RN made aware

## 2013-06-23 NOTE — ED Notes (Signed)
Pt tolerating cup of water and saltine crackers w/o difficulty.

## 2013-06-24 ENCOUNTER — Inpatient Hospital Stay (HOSPITAL_COMMUNITY)
Admission: EM | Admit: 2013-06-24 | Discharge: 2013-06-26 | DRG: 809 | Disposition: A | Discharge status: Home / Self Care | Payer: BC Managed Care – PPO | Attending: Internal Medicine | Admitting: Internal Medicine

## 2013-06-24 ENCOUNTER — Other Ambulatory Visit: Payer: Self-pay | Admitting: Oncology

## 2013-06-24 ENCOUNTER — Other Ambulatory Visit (HOSPITAL_BASED_OUTPATIENT_CLINIC_OR_DEPARTMENT_OTHER): Payer: BC Managed Care – PPO

## 2013-06-24 ENCOUNTER — Emergency Department (HOSPITAL_COMMUNITY): Payer: BC Managed Care – PPO

## 2013-06-24 ENCOUNTER — Encounter (HOSPITAL_COMMUNITY): Payer: Self-pay | Admitting: Emergency Medicine

## 2013-06-24 ENCOUNTER — Ambulatory Visit (HOSPITAL_BASED_OUTPATIENT_CLINIC_OR_DEPARTMENT_OTHER): Payer: BC Managed Care – PPO | Admitting: Hematology and Oncology

## 2013-06-24 ENCOUNTER — Ambulatory Visit (HOSPITAL_BASED_OUTPATIENT_CLINIC_OR_DEPARTMENT_OTHER): Payer: BC Managed Care – PPO

## 2013-06-24 VITALS — BP 103/69 | HR 132 | Resp 18 | Ht 63.0 in | Wt 125.8 lb

## 2013-06-24 DIAGNOSIS — Z171 Estrogen receptor negative status [ER-]: Secondary | ICD-10-CM

## 2013-06-24 DIAGNOSIS — D709 Neutropenia, unspecified: Secondary | ICD-10-CM

## 2013-06-24 DIAGNOSIS — C7800 Secondary malignant neoplasm of unspecified lung: Secondary | ICD-10-CM

## 2013-06-24 DIAGNOSIS — D61810 Antineoplastic chemotherapy induced pancytopenia: Secondary | ICD-10-CM

## 2013-06-24 DIAGNOSIS — C787 Secondary malignant neoplasm of liver and intrahepatic bile duct: Secondary | ICD-10-CM

## 2013-06-24 DIAGNOSIS — D701 Agranulocytosis secondary to cancer chemotherapy: Secondary | ICD-10-CM

## 2013-06-24 DIAGNOSIS — R5381 Other malaise: Secondary | ICD-10-CM

## 2013-06-24 DIAGNOSIS — E860 Dehydration: Secondary | ICD-10-CM

## 2013-06-24 DIAGNOSIS — C50919 Malignant neoplasm of unspecified site of unspecified female breast: Secondary | ICD-10-CM

## 2013-06-24 DIAGNOSIS — C7952 Secondary malignant neoplasm of bone marrow: Secondary | ICD-10-CM

## 2013-06-24 DIAGNOSIS — D702 Other drug-induced agranulocytosis: Secondary | ICD-10-CM

## 2013-06-24 DIAGNOSIS — Z8052 Family history of malignant neoplasm of bladder: Secondary | ICD-10-CM

## 2013-06-24 DIAGNOSIS — T451X5A Adverse effect of antineoplastic and immunosuppressive drugs, initial encounter: Principal | ICD-10-CM

## 2013-06-24 DIAGNOSIS — R112 Nausea with vomiting, unspecified: Secondary | ICD-10-CM | POA: Diagnosis present

## 2013-06-24 DIAGNOSIS — C50419 Malignant neoplasm of upper-outer quadrant of unspecified female breast: Secondary | ICD-10-CM

## 2013-06-24 DIAGNOSIS — C50411 Malignant neoplasm of upper-outer quadrant of right female breast: Secondary | ICD-10-CM

## 2013-06-24 DIAGNOSIS — Z7982 Long term (current) use of aspirin: Secondary | ICD-10-CM

## 2013-06-24 DIAGNOSIS — R509 Fever, unspecified: Secondary | ICD-10-CM

## 2013-06-24 DIAGNOSIS — C7951 Secondary malignant neoplasm of bone: Secondary | ICD-10-CM | POA: Diagnosis present

## 2013-06-24 DIAGNOSIS — K219 Gastro-esophageal reflux disease without esophagitis: Secondary | ICD-10-CM

## 2013-06-24 DIAGNOSIS — IMO0002 Reserved for concepts with insufficient information to code with codable children: Secondary | ICD-10-CM

## 2013-06-24 DIAGNOSIS — R5383 Other fatigue: Secondary | ICD-10-CM

## 2013-06-24 DIAGNOSIS — R5081 Fever presenting with conditions classified elsewhere: Secondary | ICD-10-CM | POA: Diagnosis present

## 2013-06-24 DIAGNOSIS — R110 Nausea: Secondary | ICD-10-CM

## 2013-06-24 DIAGNOSIS — D72819 Decreased white blood cell count, unspecified: Secondary | ICD-10-CM

## 2013-06-24 HISTORY — DX: Adverse effect of antineoplastic and immunosuppressive drugs, initial encounter: T45.1X5A

## 2013-06-24 HISTORY — DX: Antineoplastic chemotherapy induced pancytopenia: D61.810

## 2013-06-24 LAB — COMPREHENSIVE METABOLIC PANEL
ALBUMIN: 3.5 g/dL (ref 3.5–5.2)
ALT: 28 U/L (ref 0–35)
AST: 22 U/L (ref 0–37)
Alkaline Phosphatase: 117 U/L (ref 39–117)
BUN: 9 mg/dL (ref 6–23)
CALCIUM: 9 mg/dL (ref 8.4–10.5)
CHLORIDE: 98 meq/L (ref 96–112)
CO2: 26 mEq/L (ref 19–32)
CREATININE: 0.76 mg/dL (ref 0.50–1.10)
GFR calc Af Amer: >90 mL/min (ref >90)
GFR, EST NON AFRICAN AMERICAN: 90 mL/min — AB (ref >90)
Glucose, Bld: 100 mg/dL — ABNORMAL HIGH (ref 70–99)
Potassium: 4.1 mEq/L (ref 3.7–5.3)
Sodium: 135 mEq/L — ABNORMAL LOW (ref 137–147)
Total Bilirubin: 0.3 mg/dL (ref 0.3–1.2)
Total Protein: 6.7 g/dL (ref 6.0–8.3)

## 2013-06-24 LAB — CBC WITH DIFFERENTIAL/PLATELET
BASO%: 10 % — AB (ref 0.0–2.0)
BASOS ABS: 0 10*3/uL (ref 0.0–0.1)
Basophils Absolute: 0 10*3/uL (ref 0.0–0.1)
Basophils Relative: 13 % — ABNORMAL HIGH (ref 0–1)
EOS%: 3.3 % (ref 0.0–7.0)
Eosinophils Absolute: 0 10*3/uL (ref 0.0–0.5)
Eosinophils Absolute: 0 10*3/uL (ref 0.0–0.7)
Eosinophils Relative: 0 % (ref 0–5)
HCT: 26.6 % — ABNORMAL LOW (ref 36.0–46.0)
HEMATOCRIT: 31.2 % — AB (ref 34.8–46.6)
HEMOGLOBIN: 10.5 g/dL — AB (ref 11.6–15.9)
Hemoglobin: 9.2 g/dL — ABNORMAL LOW (ref 12.0–15.0)
LYMPH#: 0.2 10*3/uL — AB (ref 0.9–3.3)
LYMPH%: 63.3 % — ABNORMAL HIGH (ref 14.0–49.7)
LYMPHS PCT: 61 % — AB (ref 12–46)
Lymphs Abs: 0.2 10*3/uL — ABNORMAL LOW (ref 0.7–4.0)
MCH: 29.8 pg (ref 25.1–34.0)
MCH: 30.5 pg (ref 26.0–34.0)
MCHC: 33.7 g/dL (ref 31.5–36.0)
MCHC: 34.6 g/dL (ref 30.0–36.0)
MCV: 88.6 fL (ref 79.5–101.0)
MCV: 88.1 fL (ref 78.0–100.0)
MONO#: 0.1 10*3/uL (ref 0.1–0.9)
MONO%: 20 % — ABNORMAL HIGH (ref 0.0–14.0)
Monocytes Absolute: 0.1 10*3/uL (ref 0.1–1.0)
Monocytes Relative: 23 % — ABNORMAL HIGH (ref 3–12)
NEUT#: 0 10*3/uL — CL (ref 1.5–6.5)
NEUT%: 3.4 % — AB (ref 38.4–76.8)
NEUTROS PCT: 3 % — AB (ref 43–77)
Neutro Abs: 0 10*3/uL — ABNORMAL LOW (ref 1.7–7.7)
PLATELETS: 158 10*3/uL (ref 150–400)
Platelets: 149 10*3/uL (ref 145–400)
RBC: 3.52 10*6/uL — ABNORMAL LOW (ref 3.70–5.45)
RBC: 3.02 MIL/uL — ABNORMAL LOW (ref 3.87–5.11)
RDW: 14.9 % — ABNORMAL HIGH (ref 11.2–14.5)
RDW: 14.6 % (ref 11.5–15.5)
WBC: 0.3 10*3/uL — AB (ref 3.9–10.3)
WBC: 0.3 10*3/uL — CL (ref 4.0–10.5)

## 2013-06-24 LAB — COMPREHENSIVE METABOLIC PANEL (CC13)
ALBUMIN: 3.8 g/dL (ref 3.5–5.0)
ALT: 25 U/L (ref 0–55)
ANION GAP: 10 meq/L (ref 3–11)
AST: 15 U/L (ref 5–34)
Alkaline Phosphatase: 126 U/L (ref 40–150)
BUN: 12.7 mg/dL (ref 7.0–26.0)
CALCIUM: 9.6 mg/dL (ref 8.4–10.4)
CHLORIDE: 100 meq/L (ref 98–109)
CO2: 25 mEq/L (ref 22–29)
Creatinine: 0.7 mg/dL (ref 0.6–1.1)
Glucose: 121 mg/dl (ref 70–140)
POTASSIUM: 3.9 meq/L (ref 3.5–5.1)
Sodium: 135 mEq/L — ABNORMAL LOW (ref 136–145)
TOTAL PROTEIN: 7 g/dL (ref 6.4–8.3)
Total Bilirubin: 0.58 mg/dL (ref 0.20–1.20)

## 2013-06-24 MED ORDER — PROCHLORPERAZINE EDISYLATE 5 MG/ML IJ SOLN
10.0000 mg | Freq: Once | INTRAMUSCULAR | Status: AC
  Administered 2013-06-24: 10 mg via INTRAVENOUS

## 2013-06-24 MED ORDER — ONDANSETRON 8 MG/NS 50 ML IVPB
INTRAVENOUS | Status: AC
  Filled 2013-06-24: qty 8

## 2013-06-24 MED ORDER — SODIUM CHLORIDE 0.9 % IV BOLUS (SEPSIS)
1000.0000 mL | Freq: Once | INTRAVENOUS | Status: AC
  Administered 2013-06-24: 1000 mL via INTRAVENOUS

## 2013-06-24 MED ORDER — SODIUM CHLORIDE 0.9 % IV SOLN
INTRAVENOUS | Status: DC
  Administered 2013-06-24: 12:00:00 via INTRAVENOUS

## 2013-06-24 MED ORDER — ACETAMINOPHEN 325 MG PO TABS
650.0000 mg | ORAL_TABLET | Freq: Once | ORAL | Status: DC
  Filled 2013-06-24: qty 2

## 2013-06-24 MED ORDER — HEPARIN SOD (PORK) LOCK FLUSH 100 UNIT/ML IV SOLN
500.0000 [IU] | Freq: Once | INTRAVENOUS | Status: AC
  Administered 2013-06-24: 500 [IU] via INTRAVENOUS
  Filled 2013-06-24: qty 5

## 2013-06-24 MED ORDER — ONDANSETRON 8 MG/50ML IVPB (CHCC)
8.0000 mg | Freq: Once | INTRAVENOUS | Status: AC
  Administered 2013-06-24: 8 mg via INTRAVENOUS

## 2013-06-24 MED ORDER — SODIUM CHLORIDE 0.9 % IJ SOLN
10.0000 mL | Freq: Once | INTRAMUSCULAR | Status: AC
Start: 2013-06-24 — End: 2013-06-24
  Administered 2013-06-24: 10 mL via INTRAVENOUS
  Filled 2013-06-24: qty 10

## 2013-06-24 MED ORDER — PROCHLORPERAZINE EDISYLATE 5 MG/ML IJ SOLN
INTRAMUSCULAR | Status: AC
  Filled 2013-06-24: qty 2

## 2013-06-24 NOTE — ED Provider Notes (Signed)
CSN: [id number]     Arrival date & time 06/24/13  2140 History   First MD Initiated Contact with Patient 06/24/13 2148     Chief Complaint  Patient presents with  . Fever     (Consider location/radiation/quality/duration/timing/severity/associated sxs/prior Treatment) The history is provided by the patient. No language interpreter was used.  Carrie Berry is a 63 y/o F with PMHx of breast cancer stage IV with mets to the liver and lungs currently on chemotherapy once every other week seen by Dr. Marjie Skiff at [facility], anxiety, GERD presenting to the ED with chills and fever. Patient reported that today her fever increased to 101.76F tonight - at approximately 9:15 PM. Patient reported that she has been experiencing chills intermittently throughout the day with worsening after 12:00PM this afternoon. Patient reported that she was seen and assessed by her oncologist today who administered IV fluids to the patient due to dehydration. Patient reported that she felt okay after the fluids were administered - patient reported that when she came home she started not to feel too good. Patient reported that she has been experiencing sore throat described as a soreness, burning sensation that worsens with both food and fluids. Stated that she has been feeling nauseous without any episodes of emesis, stated mainly dry-heaving. Patient reported that she has been having a cough, mainly dry. Reported that she was able to eat 3 meals today - reported that all of her meals stayed down. Reported that she has been having increased fatigue since yesterday. Patient reported that she closely monitored her fever and reported that she came back to the ED when she noticed her fever was greater than 100.4 as explained yesterday in her strict return precautions by this provider. Denied vomiting, diarrhea, abdominal pain, melena, hematochezia, bodyaches, sweats. PCP Dr. Delfina Redwood  Past Medical History  Diagnosis Date  .  Recurrent sinus infections   . Arthritis   . PONV (postoperative nausea and vomiting)   . Anxiety   . Cancer     breast  . Breast cancer   . GERD (gastroesophageal reflux disease)    Past Surgical History  Procedure Laterality Date  . Tubal ligation    . Tubal ligation  1985  . Portacath placement N/A 01/10/2013    Procedure: ULTRASOUND GUIDED PORT-A-CATH INSERTION WITH FLUOROSCOPY;  Surgeon: [name], MD;  Location: [facility];  Service: General;  Laterality: N/A;   Family History  Problem Relation Age of Onset  . Bladder Cancer Father   . Colon cancer Maternal Grandmother    History  Substance Use Topics  . Smoking status: Never Smoker   . Smokeless tobacco: Never Used  . Alcohol Use: No   OB History   Grav Para Term Preterm Abortions TAB SAB Ect Mult Living                 Review of Systems  Constitutional: Positive for fever and chills. Negative for appetite change.  HENT: Positive for sore throat.   Eyes: Negative for visual disturbance.  Respiratory: Positive for cough. Negative for chest tightness and shortness of breath.   Cardiovascular: Negative for chest pain.  Gastrointestinal: Positive for nausea. Negative for vomiting, abdominal pain, diarrhea, constipation, blood in stool and anal bleeding.  Genitourinary: Negative for dysuria and decreased urine volume.  Musculoskeletal: Negative for back pain and neck pain.  Neurological: Positive for weakness. Negative for dizziness.  All other systems reviewed and are negative.      Allergies  Ondansetron hcl; Codeine; and Penicillins  Home Medications   Current Outpatient Rx  Name  Route  Sig  Dispense  Refill  . acetaminophen (TYLENOL) 325 MG tablet   Oral   Take 650 mg by mouth every 8 (eight) hours as needed for pain.         Marland Kitchen aspirin-acetaminophen-caffeine (EXCEDRIN MIGRAINE) 250-250-65 MG per tablet   Oral   Take 1 tablet by mouth every 6 (six) hours as needed for headache.         .  dexamethasone (DECADRON) 4 MG tablet      Take 2 tablets by mouth once a day on the day after chemotherapy and then take 2 tablets two times a day for 2 days. Take with food.   30 tablet   1   . lidocaine-prilocaine (EMLA) cream   Topical   Apply topically as needed.   30 g   3   . omeprazole (PRILOSEC) 20 MG capsule   Oral   Take 1 capsule (20 mg total) by mouth 2 (two) times daily.   60 capsule   3   . ondansetron (ZOFRAN ODT) 4 MG disintegrating tablet      2mg  ODT q4 hours prn vomiting   4 tablet   0   . ondansetron (ZOFRAN) 8 MG tablet   Oral   Take 8 mg by mouth every 8 (eight) hours as needed for nausea or vomiting.         Marland Kitchen PATADAY 0.2 % SOLN   Both Eyes   Place 1 drop into both eyes daily as needed (dry eyes).          . prochlorperazine (COMPAZINE) 10 MG tablet   Oral   Take 1 tablet (10 mg total) by mouth every 6 (six) hours as needed for nausea.   30 tablet   2   . tobramycin-dexamethasone (TOBRADEX) ophthalmic solution   Both Eyes   Place 1 drop into both eyes daily.   5 mL   1   . traMADol (ULTRAM) 50 MG tablet   Oral   Take 1 tablet (50 mg total) by mouth every 6 (six) hours as needed for moderate pain.   60 tablet   0   . traZODone (DESYREL) 50 MG tablet   Oral   Take 100 mg by mouth. Every other night at bedtime as needed  for sleep         . triamcinolone (NASACORT) 55 MCG/ACT nasal inhaler   Nasal   Place 2 sprays into the nose daily.         Marland Kitchen zolpidem (AMBIEN) 5 MG tablet   Oral   Take 5 mg by mouth. Every other night at bedtime as needed for sleep          BP 109/70  Pulse 112  Temp(Src) 100.3 F (37.9 C) (Oral)  Resp 18  SpO2 98% Physical Exam  Nursing note and vitals reviewed. Constitutional: She is oriented to person, place, and time. She appears well-developed and well-nourished. No distress.  Patient found laying in bed wrapped in blanket  HENT:  Head: Normocephalic and atraumatic.  Mouth/Throat:  Oropharynx is clear and moist. No oropharyngeal exudate.  Eyes: Conjunctivae and EOM are normal. Pupils are equal, round, and reactive to light. Right eye exhibits no discharge. Left eye exhibits no discharge.  Neck: Normal range of motion. Neck supple. No tracheal deviation present.  Negative neck stiffness Negative nuchal rigidity Negative cervical lymphadenopathy  Cardiovascular: Regular  rhythm and normal heart sounds.  Exam reveals no friction rub.   No murmur heard. Tachycardia upon auscultation  Cap refill < 3 seconds  Pulmonary/Chest: Effort normal and breath sounds normal. No respiratory distress. She has no wheezes. She has no rales.  Abdominal: Soft. Bowel sounds are normal. There is no tenderness. There is no guarding.  Musculoskeletal: Normal range of motion.  Full ROM to upper and lower extremities without difficulty noted, negative ataxia noted.  Lymphadenopathy:    She has no cervical adenopathy.  Neurological: She is alert and oriented to person, place, and time. No cranial nerve deficit. She exhibits normal muscle tone. Coordination normal.  Cranial nerves III-XII grossly intact Strength 5+/5+ to upper and lower extremities bilaterally with resistance applied, equal distribution noted  Skin: Skin is warm and dry. No rash noted. She is not diaphoretic. No erythema.  Psychiatric: She has a normal mood and affect. Her behavior is normal. Thought content normal.    ED Course  Procedures (including critical care time)  This provider saw and assessed patient yesterday regarding mild low-grade fever of 99.2 - patient reported that the fever never went above this level. Reported that she was feeling dehydrated and nauseous. Patient was given zofran and IV fluids that she responded well too. Patient did not have a fever in the ED setting - upon discharge patient had a temperature of 98.325F. Strict return instructions were given that is temperature increased or symptoms worsened -  patient was to come back to the ED - patient listened. Patient was seen and assessed by oncologist today where 1500 mL of IV fluids were administered. Patient due to report back to the re-assessed for blood work to be performed on 07/01/2013.   12:05 AM This provider spoke with [facility] hospitalist, Dr. Mariah Milling - discussed case, history, presentation, labs and imaging. Recommended admission for the patient secondary to patient being immunocompromised, cancer patient presenting to the ED with fever of 100.25F. [facility] to see and assess patient. Patient to be admitted to [facility] - to obtain blood cultures and for broad spectrum anitbiotics to be started.   Results for orders placed during the hospital encounter of 06/24/13  CBC WITH DIFFERENTIAL      Result Value Range   WBC 0.3 (*) 4.0 - 10.5 K/uL   RBC 3.02 (*) 3.87 - 5.11 MIL/uL   Hemoglobin 9.2 (*) 12.0 - 15.0 g/dL   HCT 26.6 (*) 36.0 - 46.0 %   MCV 88.1  78.0 - 100.0 fL   MCH 30.5  26.0 - 34.0 pg   MCHC 34.6  30.0 - 36.0 g/dL   RDW 14.6  11.5 - 15.5 %   Platelets 158  150 - 400 K/uL   Neutrophils Relative % 3 (*) 43 - 77 %   Lymphocytes Relative 61 (*) 12 - 46 %   Monocytes Relative 23 (*) 3 - 12 %   Eosinophils Relative 0  0 - 5 %   Basophils Relative 13 (*) 0 - 1 %   Neutro Abs 0.0 (*) 1.7 - 7.7 K/uL   Lymphs Abs 0.2 (*) 0.7 - 4.0 K/uL   Monocytes Absolute 0.1  0.1 - 1.0 K/uL   Eosinophils Absolute 0.0  0.0 - 0.7 K/uL   Basophils Absolute 0.0  0.0 - 0.1 K/uL   RBC Morphology TEARDROP CELLS     WBC Morphology TOXIC GRANULATION     Smear Review LARGE PLATELETS PRESENT    COMPREHENSIVE METABOLIC PANEL  Result Value Range   Sodium 135 (*) 137 - 147 mEq/L   Potassium 4.1  3.7 - 5.3 mEq/L   Chloride 98  96 - 112 mEq/L   CO2 26  19 - 32 mEq/L   Glucose, Bld 100 (*) 70 - 99 mg/dL   BUN 9  6 - 23 mg/dL   Creatinine, Ser 0.76  0.50 - 1.10 mg/dL   Calcium 9.0  8.4 - 10.5 mg/dL   Total Protein 6.7  6.0 - 8.3 g/dL   Albumin 3.5  3.5 -  5.2 g/dL   AST 22  0 - 37 U/L   ALT 28  0 - 35 U/L   Alkaline Phosphatase 117  39 - 117 U/L   Total Bilirubin 0.3  0.3 - 1.2 mg/dL   GFR calc non Af Amer 90 (*) >90 mL/min   GFR calc Af Amer >90  >90 mL/min   Dg Chest 2 View  06/24/2013   CLINICAL DATA:  Fever.  Cough for 1 day.  EXAM: CHEST  2 VIEW  COMPARISON:  DG ABD ACUTE W/CHEST dated 06/23/2013; CT CHEST W/CM dated 03/17/2013; CT CHEST W/CM dated 05/16/2013  FINDINGS: Pulmonary nodules demonstrated on prior CT are difficult to visualize radiographically. Right IJ Port-A-Cath appears unchanged. Chronic bronchitic changes at the bases.  No airspace disease or pleural effusion identified. No interval change. Cardiopericardial silhouette is within normal limits.  IMPRESSION: No interval change or acute abnormality. Pulmonary nodules demonstrated on prior chest CT compatible with metastatic disease are difficult to resolve radiographically.   Electronically Signed   By: Dereck Ligas M.D.   On: 06/24/2013 23:03   Dg Abd Acute W/chest  06/23/2013   CLINICAL DATA:  Umbilical pain. History of breast cancer with pulmonary metastases.  EXAM: ACUTE ABDOMEN SERIES (ABDOMEN 2 VIEW & CHEST 1 VIEW)  COMPARISON:  CT chest 05/16/2013  FINDINGS: Port-A-Cath remains in place. Pulmonary nodules are noted but better demonstrated on CT scan. The lungs are otherwise clear. Heart size is normal. No pneumothorax or pleural fluid.  Two views of the abdomen show no free intraperitoneal air. The bowel gas pattern is normal.  IMPRESSION: No acute finding chest or abdomen. Pulmonary nodules consistent with metastatic breast cancer are noted as seen on prior CT.   Electronically Signed   By: Inge Rise M.D.   On: 06/23/2013 20:49    Labs Review Labs Reviewed  CBC WITH DIFFERENTIAL - Abnormal; Notable for the following:    WBC 0.3 (*)    RBC 3.02 (*)    Hemoglobin 9.2 (*)    HCT 26.6 (*)    Neutrophils Relative % 3 (*)    Lymphocytes Relative 61 (*)    Monocytes  Relative 23 (*)    Basophils Relative 13 (*)    Neutro Abs 0.0 (*)    Lymphs Abs 0.2 (*)    All other components within normal limits  COMPREHENSIVE METABOLIC PANEL - Abnormal; Notable for the following:    Sodium 135 (*)    Glucose, Bld 100 (*)    GFR calc non Af Amer 90 (*)    All other components within normal limits  RAPID STREP SCREEN  URINALYSIS, ROUTINE W REFLEX MICROSCOPIC   Imaging Review Dg Chest 2 View  06/24/2013   CLINICAL DATA:  Fever.  Cough for 1 day.  EXAM: CHEST  2 VIEW  COMPARISON:  DG ABD ACUTE W/CHEST dated 06/23/2013; CT CHEST W/CM dated 03/17/2013; CT CHEST W/CM dated 05/16/2013  FINDINGS:  Pulmonary nodules demonstrated on prior CT are difficult to visualize radiographically. Right IJ Port-A-Cath appears unchanged. Chronic bronchitic changes at the bases.  No airspace disease or pleural effusion identified. No interval change. Cardiopericardial silhouette is within normal limits.  IMPRESSION: No interval change or acute abnormality. Pulmonary nodules demonstrated on prior chest CT compatible with metastatic disease are difficult to resolve radiographically.   Electronically Signed   By: Dereck Ligas M.D.   On: 06/24/2013 23:03   Dg Abd Acute W/chest  06/23/2013   CLINICAL DATA:  Umbilical pain. History of breast cancer with pulmonary metastases.  EXAM: ACUTE ABDOMEN SERIES (ABDOMEN 2 VIEW & CHEST 1 VIEW)  COMPARISON:  CT chest 05/16/2013  FINDINGS: Port-A-Cath remains in place. Pulmonary nodules are noted but better demonstrated on CT scan. The lungs are otherwise clear. Heart size is normal. No pneumothorax or pleural fluid.  Two views of the abdomen show no free intraperitoneal air. The bowel gas pattern is normal.  IMPRESSION: No acute finding chest or abdomen. Pulmonary nodules consistent with metastatic breast cancer are noted as seen on prior CT.   Electronically Signed   By: Inge Rise M.D.   On: 06/23/2013 20:49    EKG Interpretation   None       MDM    Final diagnoses:  Fever  Neutropenia  Breast cancer   Medications  acetaminophen (TYLENOL) tablet 650 mg (not administered)  sodium chloride 0.9 % bolus 1,000 mL (1,000 mLs Intravenous New Bag/Given 06/24/13 2245)   Filed Vitals:   06/24/13 2147 06/24/13 2308  BP: 104/56 109/70  Pulse: 108 112  Temp: 99.7 F (37.6 C) 100.3 F (37.9 C)  TempSrc: Oral Oral  Resp: 18 18  SpO2: 98% 98%    Patient presenting to the ED with increased fever, chills, and nausea that got worse this afternoon after patient was given IV fluids by her oncologist. Patient reported that her fever has reached approximately 101.3 F today at approximately 9:15 PM - patient reported that she was instructed to come back to the ED if fever was to increase - this provider gave her those instructions.  This provider saw and assessed patient yesterday regarding mild low-grade fever of 99.2 - patient reported that the fever never went above this level. Reported that she was feeling dehydrated and nauseous. Patient was given zofran and IV fluids that she responded well too. Patient did not have a fever in the ED setting - upon discharge patient had a temperature of 98.5F. Strict return instructions were given that is temperature increased or symptoms worsened - patient was to come back to the ED - patient listened. Patient was seen and assessed by oncologist today where 1500 mL of IV fluids were administered. Alert and oriented. GCS 15. Patient found laying in bed wrapped in blanket. Heart rate noted to be increased - tachycardia noted upon auscultation. Regular rhythm. Radial pulses 2+ bilaterally. Lungs clear to auscultation to upper and lower lobes bilaterally without difficulty noted. Negative signs of respiratory distress. Cap refill < 3 seconds. Negative swelling or pitting edema noted to the lower extremities bilaterally. Full ROM to upper and lower extremities bilaterally without difficulty noted. Strength intact with  resistance applied, equal distribution noted. Equal grip strength.  Chest x-ray negative acute abnormalities noted-pulmonary nodules demonstrated that was seen on prior chest CT is compatible with metastatic disease. CMP negative findings. CBC noted drop in white blood cell of 0.3 with drop of neutrophil count-1 compared to yesterday patient's neutrophil count  is approximately 16 has now dropped to 3. Elevated lymphocytes and monocytes-lymphocytes a 61, monocytes of 23, basophils elevated at 13-possible left shift noted. Patient given IV fluids and Tylenol to control fever. Fever of 100.48F while in ED setting. Noticeable drop in neutrophils from 16-3 when compared to 1 day. Elevation in basophils, lymphocytes, monocytes identified. Positive left shift. Urine pending. Discussed case, history, presentation, labs and imaging with [facility] hospitalist-patient to be seen and assessed. Patient to be admitted to the hospital secondary to being cancer patient, immunocompromised. Patient to be admitted to the hospital to [facility] for febrile neutropenia. Patient to be placed on IV antibiotics. Patient agreed to plan of admission. Patient stable for transfer.   Jamse Mead, PA-C 06/25/13 1258

## 2013-06-24 NOTE — Progress Notes (Signed)
To the Patient ID: Carrie Berry, female   DOB: Jul 01, 1950, 63 y.o.   MRN: [id number] ID: Carrie Berry OB: 23-Nov-1950  MR#: [id number]  CSN#:[id number]  PCP: [name], MD GYN:   SU: [name] OTHER MD: Thea Silversmith, Earle Gell, Christene Slates, Erline Levine  DIAGNOSIS: Metastatic breast cancer   CURRENT THERAPY: Status post cycle 3 chemotherapy with dose dense AC    CHIEF COMPLAINT: Nausea and vomiting  HISTORY OF PRESENT ILLNESS: [name] noted a mass in her right breast December of 2013, but did not think much of it. It did grow some lower the summer, but she was keeping her grandchild at that time and was too busy so she did not bring it to Dr. Bernell List attention until August. He set her up for bilateral mammography and ultrasonography at [facility] 12/18/2012 and this measured, on the right, a large irregular lobulated mass in the upper outer quadrant which by ultrasound measured 5.7 cm. The right axilla showed some lymph nodes with thickened cortices. In the left breast there was a focal asymmetry at the depth, lateral to the nipple, and by ultrasound there was a 9 mm ill-defined hypoechoic lesion in this location. Biopsy of the left breast lesion showed only fibrocystic changes.  Biopsy of the right breast mass and right axillary adenopathy ([id number]) on 12/23/2012 showed both to be involved by invasive ductal carcinoma, grade 2, triple negative, with an MIB-1 of 86%.  MRI of the breast 12/27/2012 at [facility] imaging showed in the right breast a mass abutting the pectoralis muscle without enhancement of the muscle measuring 4.8 cm. The satellite nodule measuring approximately 3 mm was also noted superior to the mass and several abnormal and enlarged right axillary lymph nodes were noted, one of which appeared to be necrotic. The largest node measured 2.0 cm. Unfortunately, numerous bilateral pulmonary nodules were also noted.  The patient's subsequent history is as detailed  below   INTERVAL HISTORY: Carrie Berry is here for followup  Visit .Carrie Berry went to the ER last night with severe nausea and vomiting.  She received 1 L of IV fluids and also IV nausea vomiting medications. She felt better and  was discharged to follow today in the office. She says that she's not able to eat well and also drink enough fluids. She had vomitings on Saturday. Sunday she felt a little better. In ER she had a abdominal x-ray and it was negative for any bowel obstruction. At present she feels very tired still has persistent nausea. Denies any fever, shortness of breath, chest pains, palpitations   REVIEW OF SYSTEMS:   All 14 review of systems is been assessed and the pertinent symptoms are listed in interval history    PAST MEDICAL HISTORY: Past Medical History  Diagnosis Date  . Recurrent sinus infections   . Arthritis   . PONV (postoperative nausea and vomiting)   . Anxiety   . Cancer     breast  . Breast cancer   . GERD (gastroesophageal reflux disease)     PAST SURGICAL HISTORY: Past Surgical History  Procedure Laterality Date  . Tubal ligation    . Tubal ligation  1985  . Portacath placement N/A 01/10/2013    Procedure: ULTRASOUND GUIDED PORT-A-CATH INSERTION WITH FLUOROSCOPY;  Surgeon: [name], MD;  Location: [place];  Service: General;  Laterality: N/A;    FAMILY HISTORY Family History  Problem Relation Age of Onset  . Bladder Cancer Father   . Colon  cancer Maternal Grandmother    the patient's parents are living, both in their 85s. The patient's father was diagnosed with bladder cancer the age of 12. The patient's mother's mother had some type of gastrointestinal cancer. The patient had one brother, no sisters. There is no history of breast or ovarian cancer in the family other than a cousin on the father's side who was diagnosed with breast cancer apparently before the age of 37.  GYNECOLOGIC HISTORY:  Menarche age 89, first live birth age 38, the  patient is [place] P2. She went through menopause in 2008. She did not take hormone replacement. She took birth control for approximately 22 years remotely.  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 She's currently on disability. Her husband Shawnay Bramel [name]"), is vice Development worker, international aid for [employer]. The patient's daughter Carrie Berry is a stay-at-home mom in [place], the patient's son Carrie Berry unfortunately died in an automobile accident in December of 2011. The patient has 2 grandchildren. She attends to a local [employer].    ADVANCED DIRECTIVES: Not in place   HEALTH MAINTENANCE: (Updated December 2014) History  Substance Use Topics  . Smoking status: Never Smoker   . Smokeless tobacco: Never Used  . Alcohol Use: No     Colonoscopy: 2005  PAP: Not on file  Bone density: Never  Lipid panel:  Dr. Delfina Redwood   Allergies  Allergen Reactions  . Ondansetron Hcl Other (See Comments)    HEADACHE, patient states she still takes   . Codeine Nausea And Vomiting    "violently ill"  . Penicillins Rash    Childhood reaction -    Current Outpatient Prescriptions  Medication Sig Dispense Refill  . acetaminophen (TYLENOL) 325 MG tablet Take 650 mg by mouth every 8 (eight) hours as needed for pain.      Marland Kitchen aspirin-acetaminophen-caffeine (EXCEDRIN MIGRAINE) 250-250-65 MG per tablet Take 1 tablet by mouth every 6 (six) hours as needed for headache.      . dexamethasone (DECADRON) 4 MG tablet Take 2 tablets by mouth once a day on the day after chemotherapy and then take 2 tablets two times a day for 2 days. Take with food.  30 tablet  1  . lidocaine-prilocaine (EMLA) cream Apply topically as needed.  30 g  3  . omeprazole (PRILOSEC) 20 MG capsule Take 1 capsule (20 mg total) by mouth 2 (two) times daily.  60 capsule  3  . ondansetron (ZOFRAN ODT) 4 MG disintegrating  tablet 2mg  ODT q4 hours prn vomiting  4 tablet  0  . ondansetron (ZOFRAN) 8 MG tablet Take 8 mg by mouth every 8 (eight) hours as needed for nausea or vomiting.      . prochlorperazine (COMPAZINE) 10 MG tablet Take 1 tablet (10 mg total) by mouth every 6 (six) hours as needed for nausea.  30 tablet  2  . tobramycin-dexamethasone (TOBRADEX) ophthalmic solution Place 1 drop into both eyes daily.  5 mL  1  . traMADol (ULTRAM) 50 MG tablet Take 1 tablet (50 mg total) by mouth every 6 (six) hours as needed for moderate pain.  60 tablet  0  . traZODone (DESYREL) 50 MG tablet Take 100 mg by mouth. Every other night at bedtime as needed  for sleep      . triamcinolone (NASACORT) 55 MCG/ACT nasal inhaler Place 2 sprays into the nose daily.      Marland Kitchen  zolpidem (AMBIEN) 5 MG tablet Take 5 mg by mouth. Every other night at bedtime as needed for sleep      . PATADAY 0.2 % SOLN Place 1 drop into both eyes daily as needed (dry eyes).        Current Facility-Administered Medications  Medication Dose Route Frequency Provider Last Rate Last Dose  . 0.9 %  sodium chloride infusion   Intravenous Continuous Wilmon Arms, MD        OBJECTIVE: Middle-aged white woman in no acute distress Filed Vitals:   06/24/13 1105  BP: 103/69  Pulse: 132  Resp: 18     Body mass index is 22.29 kg/(m^2).    ECOG FS: 1 Filed Weights   06/24/13 1105  Weight: 125 lb 12.8 oz (57.063 kg)   Physical Exam: HEENT:  Sclerae anicteric.  Oropharynx clear and moist. No ulcerations, and no evidence of oropharyngeal candidiasis. Nasal mucosa is mildly erythematous and edematous bilaterally. Neck is supple.  NODES:  No cervical or supraclavicular lymphadenopathy palpated.  BREAST EXAM: Decrease in the size of the  right breast mass was noted with no tenderness. The palpable nodules in the lateral portion of the breast do appear somewhat softer with palpation. There is a palpable nodule in the right axilla.  LUNGS:  Clear to auscultation  bilaterally.  No wheezes or rhonchi HEART:  Regular rate and rhythm. No murmur appreciated ABDOMEN:  Soft, nontender. No hepatomegaly palpated.  Positive bowel sounds.  MSK:  No focal spinal tenderness to palpation. Good range of motion bilaterally in the upper extremities. EXTREMITIES:  No peripheral edema.   SKIN:  Benign with no visible rashes or skin lesions. Port is intact in the chest wall with no erythema or edema, and no evidence of infection/cellulitis. NEURO:  Nonfocal. Well oriented.  Appropriate affect.   LAB RESULTS:   Lab Results  Component Value Date   WBC 0.3* 06/24/2013   NEUTROABS 0.0* 06/24/2013   HGB 10.5* 06/24/2013   HCT 31.2* 06/24/2013   MCV 88.6 06/24/2013   PLT 149 06/24/2013      Chemistry      Component Value Date/Time   NA 135* 06/24/2013 1050   NA 133* 06/23/2013 1858   K 3.9 06/24/2013 1050   K 4.1 06/23/2013 1858   CL 94* 06/23/2013 1858   CO2 25 06/24/2013 1050   CO2 26 06/23/2013 1858   BUN 12.7 06/24/2013 1050   BUN 15 06/23/2013 1858   CREATININE 0.7 06/24/2013 1050   CREATININE 0.67 06/23/2013 1858      Component Value Date/Time   CALCIUM 9.6 06/24/2013 1050   CALCIUM 9.0 06/23/2013 1858   ALKPHOS 126 06/24/2013 1050   ALKPHOS 138* 06/23/2013 1858   AST 15 06/24/2013 1050   AST 17 06/23/2013 1858   ALT 25 06/24/2013 1050   ALT 21 06/23/2013 1858   BILITOT 0.58 06/24/2013 1050   BILITOT 0.5 06/23/2013 1858        STUDIES:  Ct Chest W Contrast  05/16/2013   CLINICAL DATA:  Followup of breast cancer. Diagnosed 8/13. Chemotherapy ongoing.  EXAM: CT CHEST WITH CONTRAST  TECHNIQUE: Multidetector CT imaging of the chest was performed during intravenous contrast administration.  CONTRAST:  28mL OMNIPAQUE IOHEXOL 300 MG/ML  SOLN  COMPARISON:  03/17/2013  FINDINGS: Lungs/Pleura: Mild progression of pulmonary metastasis. Index right lower lobe nodule measures 6 mm on image 29 today versus 5 mm on the prior.  Posterior left upper lobe 8 mm nodule on image  20/series 5 is  increased from 6 mm on the prior.  Index right lower lobe nodule measures 12 mm on image 25 versus 9 mm on the prior.  No pleural fluid.  Heart/Mediastinum: A right-sided Port-A-Cath which terminates at the mid SVC.  Right axillary node which measures 1.5 cm on image 23 versus 1.6 cm on the prior.  Right breast mass which measures 5.6 x 7.8 cm on image 38 and has enlarged from 4.7 x 4.7 cm on the prior.  No left axillary adenopathy. Normal heart size, without pericardial effusion. No mediastinal or hilar adenopathy.  Upper Abdomen: Progressive hepatic metastasis. Index posterior segment right hepatic lobe 2.1 cm lesion on image 59 measured 1.7 cm on the prior.  A medial segment left liver lobe 1.7 cm lesion on image 64 measured 1.4 cm on the prior.  Normal adrenal glands.  Bones/Musculoskeletal:  No acute osseous abnormality.  IMPRESSION: 1. Progressive metastatic disease, including within the liver and lungs. 2. Primary breast mass is also enlarged since the prior. 3. Right axillary adenopathy is similar to minimally improved.   Electronically Signed   By: Abigail Miyamoto M.D.   On: 05/16/2013 09:21     Transthoracic Echocardiography  Patient: Lacye, Weichman MR #: [id number] Study Date: 05/02/2013 Gender: F Age: 58 Height: 162.6cm Weight: 60.8kg BSA: 1.54m^2 Pt. Status: Room:  PERFORMING Gully, [place] Ravens, Amy SONOGRAPHER Mauricio Po, RDCS, CCT cc:  ------------------------------------------------------------ LV EF: 55% - 60%  ------------------------------------------------------------ History: PMH: Breast Cancer   ASSESSMENT/PLAN:  63 y.o. [name], [place] woman status post right breast upper outer quadrant and right axillary lymph node biopsy 12/23/2012 for a clinical T3 N1 M1, stage IV invasive ductal carcinoma, grade 3, triple negative, with an MIB-1 of 86%.  (1) right liver lobe biopsy 01/21/2013 confirms metastatic adenocarcinoma; scans  show involvement of the liver, lungs, and likely bone.  (2) enrolled in [facility] study [id number] (docetaxel + oral gamma secretase inibitor [id number]), received one cycle starting 02/04/2013  but withdrew because of poor tolerance despite treatment interruptions and decreased dosing of the oral component  (3)  chest CT in early November 2014 was compared with studies in August 2014 and showed interval progression of metastatic breast cancer, with an enlarging primary right breast mass, enlarging right axillary lymph nodes, enlarging pulmonary nodules and new/enlarging hepatic metastases.  (4) Abraxane started 03/19/2013, given day 1 and day 8 of each 21 day cycle, stopped with the day 1 cycle 3 dose (04/29/2013) because of local progression of disease  (5) cyclophosphamide and doxorubicin started 05/20/2013, given in dose dense fashion  with Neulasta support on day #2.  The plan is to complete 4 cycles, to be followed by definitive local treatment.  (6) leukopenia with severe neutropenia secondary to chemotherapy-status post Neupogen therapy and on  neutropenic precautions  (7) nausea and vomiting secondary to chemotherapy induced: Will arrange for 1500 mL of normal saline to be given today. Will also give IV Zofran 8mg  and IV compazine 10mg . continue Zofran4 milligrams ODT Every 6 hours as needed for nausea and vomiting  (8) Dehydration secondary to nausea vomiting /poor po intake: Will arrange for 1500 mL of normal saline to be given today  (8) fatigue secondary to chemotherapy  (9) followup on 07/01/2013 with CBC differential, CMP, office visit and for fourth cycle of dose dense AC chemotherapy   All questions were answered. The patient knows to call the clinic with any problems, questions or concerns. We can  certainly see the patient much sooner if necessary.  I spent 20 minutes counseling the patient face to face. The total time spent in the appointment was 30 minutes.   Wilmon Arms, MD   Medical Oncology   06/24/2013 3:04 PM

## 2013-06-24 NOTE — ED Notes (Signed)
Pt c/o fever since 2030 this evening, states here last pm for dehydration and received fluids; states last chemo last tuesday

## 2013-06-24 NOTE — ED Provider Notes (Signed)
Medical screening examination/treatment/procedure(s) were performed by non-physician practitioner and as supervising physician I was immediately available for consultation/collaboration.  EKG Interpretation   None        Threasa Beards, MD 06/24/13 [phone]

## 2013-06-24 NOTE — Progress Notes (Signed)
Scheduling communication printed and left for infusion room scheduler.  Patient to return 06-27-2013 for IVF per provider orders.

## 2013-06-24 NOTE — Patient Instructions (Signed)
Dehydration, Adult Dehydration is when you lose more fluids from the body than you take in. Vital organs like the kidneys, brain, and heart cannot function without a proper amount of fluids and salt. Any loss of fluids from the body can cause dehydration.  CAUSES   Vomiting.  Diarrhea.  Excessive sweating.  Excessive urine output.  Fever. SYMPTOMS  Mild dehydration  Thirst.  Dry lips.  Slightly dry mouth. Moderate dehydration  Very dry mouth.  Sunken eyes.  Skin does not bounce back quickly when lightly pinched and released.  Dark urine and decreased urine production.  Decreased tear production.  Headache. Severe dehydration  Very dry mouth.  Extreme thirst.  Rapid, weak pulse (more than 100 beats per minute at rest).  Cold hands and feet.  Not able to sweat in spite of heat and temperature.  Rapid breathing.  Blue lips.  Confusion and lethargy.  Difficulty being awakened.  Minimal urine production.  No tears. DIAGNOSIS  Your caregiver will diagnose dehydration based on your symptoms and your exam. Blood and urine tests will help confirm the diagnosis. The diagnostic evaluation should also identify the cause of dehydration. TREATMENT  Treatment of mild or moderate dehydration can often be done at home by increasing the amount of fluids that you drink. It is best to drink small amounts of fluid more often. Drinking too much at one time can make vomiting worse. Refer to the home care instructions below. Severe dehydration needs to be treated at the hospital where you will probably be given intravenous (IV) fluids that contain water and electrolytes. HOME CARE INSTRUCTIONS   Ask your caregiver about specific rehydration instructions.  Drink enough fluids to keep your urine clear or pale yellow.  Drink small amounts frequently if you have nausea and vomiting.  Eat as you normally do.  Avoid:  Foods or drinks high in sugar.  Carbonated  drinks.  Juice.  Extremely hot or cold fluids.  Drinks with caffeine.  Fatty, greasy foods.  Alcohol.  Tobacco.  Overeating.  Gelatin desserts.  Wash your hands well to avoid spreading bacteria and viruses.  Only take over-the-counter or prescription medicines for pain, discomfort, or fever as directed by your caregiver.  Ask your caregiver if you should continue all prescribed and over-the-counter medicines.  Keep all follow-up appointments with your caregiver. SEEK MEDICAL CARE IF:  You have abdominal pain and it increases or stays in one area (localizes).  You have a rash, stiff neck, or severe headache.  You are irritable, sleepy, or difficult to awaken.  You are weak, dizzy, or extremely thirsty. SEEK IMMEDIATE MEDICAL CARE IF:   You are unable to keep fluids down or you get worse despite treatment.  You have frequent episodes of vomiting or diarrhea.  You have blood or green matter (bile) in your vomit.  You have blood in your stool or your stool looks black and tarry.  You have not urinated in 6 to 8 hours, or you have only urinated a small amount of very dark urine.  You have a fever.  You faint. MAKE SURE YOU:   Understand these instructions.  Will watch your condition.  Will get help right away if you are not doing well or get worse. Document Released: 05/01/2005 Document Revised: 07/24/2011 Document Reviewed: 12/19/2010 Exit[employer] Patient Information 2014 [employer].  

## 2013-06-25 ENCOUNTER — Telehealth: Payer: Self-pay | Admitting: *Deleted

## 2013-06-25 ENCOUNTER — Encounter (HOSPITAL_COMMUNITY): Payer: Self-pay | Admitting: Internal Medicine

## 2013-06-25 DIAGNOSIS — R5081 Fever presenting with conditions classified elsewhere: Secondary | ICD-10-CM

## 2013-06-25 DIAGNOSIS — C800 Disseminated malignant neoplasm, unspecified: Secondary | ICD-10-CM

## 2013-06-25 DIAGNOSIS — D709 Neutropenia, unspecified: Secondary | ICD-10-CM

## 2013-06-25 DIAGNOSIS — C773 Secondary and unspecified malignant neoplasm of axilla and upper limb lymph nodes: Secondary | ICD-10-CM

## 2013-06-25 DIAGNOSIS — C7800 Secondary malignant neoplasm of unspecified lung: Secondary | ICD-10-CM

## 2013-06-25 DIAGNOSIS — C50419 Malignant neoplasm of upper-outer quadrant of unspecified female breast: Secondary | ICD-10-CM

## 2013-06-25 DIAGNOSIS — D61810 Antineoplastic chemotherapy induced pancytopenia: Secondary | ICD-10-CM

## 2013-06-25 DIAGNOSIS — T451X5A Adverse effect of antineoplastic and immunosuppressive drugs, initial encounter: Principal | ICD-10-CM

## 2013-06-25 DIAGNOSIS — R110 Nausea: Secondary | ICD-10-CM

## 2013-06-25 DIAGNOSIS — C787 Secondary malignant neoplasm of liver and intrahepatic bile duct: Secondary | ICD-10-CM

## 2013-06-25 DIAGNOSIS — E860 Dehydration: Secondary | ICD-10-CM

## 2013-06-25 HISTORY — DX: Antineoplastic chemotherapy induced pancytopenia: D61.810

## 2013-06-25 HISTORY — DX: Adverse effect of antineoplastic and immunosuppressive drugs, initial encounter: T45.1X5A

## 2013-06-25 LAB — URINALYSIS, ROUTINE W REFLEX MICROSCOPIC
Bilirubin Urine: NEGATIVE
GLUCOSE, UA: NEGATIVE mg/dL
Hgb urine dipstick: NEGATIVE
Ketones, ur: NEGATIVE mg/dL
Leukocytes, UA: NEGATIVE
Nitrite: NEGATIVE
Protein, ur: NEGATIVE mg/dL
SPECIFIC GRAVITY, URINE: 1.007 (ref 1.005–1.030)
UROBILINOGEN UA: 0.2 mg/dL (ref 0.0–1.0)
pH: 6.5 (ref 5.0–8.0)

## 2013-06-25 LAB — CBC
HCT: 20.8 % — ABNORMAL LOW (ref 36.0–46.0)
Hemoglobin: 7 g/dL — ABNORMAL LOW (ref 12.0–15.0)
MCH: 29.8 pg (ref 26.0–34.0)
MCHC: 33.7 g/dL (ref 30.0–36.0)
MCV: 88.5 fL (ref 78.0–100.0)
PLATELETS: 127 10*3/uL — AB (ref 150–400)
RBC: 2.35 MIL/uL — ABNORMAL LOW (ref 3.87–5.11)
RDW: 14.8 % (ref 11.5–15.5)
WBC: 0.6 10*3/uL — AB (ref 4.0–10.5)

## 2013-06-25 LAB — BASIC METABOLIC PANEL
BUN: 8 mg/dL (ref 6–23)
CHLORIDE: 102 meq/L (ref 96–112)
CO2: 25 mEq/L (ref 19–32)
Calcium: 8 mg/dL — ABNORMAL LOW (ref 8.4–10.5)
Creatinine, Ser: 0.67 mg/dL (ref 0.50–1.10)
GFR calc non Af Amer: >90 mL/min (ref >90)
Glucose, Bld: 104 mg/dL — ABNORMAL HIGH (ref 70–99)
POTASSIUM: 4 meq/L (ref 3.7–5.3)
SODIUM: 136 meq/L — AB (ref 137–147)

## 2013-06-25 MED ORDER — ALBUTEROL SULFATE (2.5 MG/3ML) 0.083% IN NEBU: 2.5000 mg | INHALATION_SOLUTION | RESPIRATORY_TRACT | Status: DC | PRN

## 2013-06-25 MED ORDER — VANCOMYCIN HCL IN DEXTROSE 750-5 MG/150ML-% IV SOLN
750.0000 mg | Freq: Two times a day (BID) | INTRAVENOUS | Status: DC
  Administered 2013-06-25 – 2013-06-26 (×2): 750 mg via INTRAVENOUS
  Filled 2013-06-25 (×3): qty 150

## 2013-06-25 MED ORDER — TRAMADOL HCL 50 MG PO TABS
50.0000 mg | ORAL_TABLET | Freq: Four times a day (QID) | ORAL | Status: DC | PRN
  Administered 2013-06-25: 50 mg via ORAL

## 2013-06-25 MED ORDER — ZOLPIDEM TARTRATE 5 MG PO TABS: 5.0000 mg | ORAL_TABLET | Freq: Every evening | ORAL | Status: DC | PRN

## 2013-06-25 MED ORDER — BOOST PLUS PO LIQD
237.0000 mL | Freq: Two times a day (BID) | ORAL | Status: DC
  Administered 2013-06-25 – 2013-06-26 (×2): 237 mL via ORAL
  Filled 2013-06-25 (×3): qty 237

## 2013-06-25 MED ORDER — FLUTICASONE PROPIONATE 50 MCG/ACT NA SUSP
1.0000 | Freq: Every day | NASAL | Status: DC
  Administered 2013-06-25 – 2013-06-26 (×2): 1 via NASAL
  Filled 2013-06-25: qty 16

## 2013-06-25 MED ORDER — HYDROCODONE-ACETAMINOPHEN 5-325 MG PO TABS: 1.0000 | ORAL_TABLET | ORAL | Status: DC | PRN

## 2013-06-25 MED ORDER — ACETAMINOPHEN 650 MG RE SUPP: 650.0000 mg | Freq: Four times a day (QID) | RECTAL | Status: DC | PRN

## 2013-06-25 MED ORDER — OLOPATADINE HCL 0.1 % OP SOLN
1.0000 [drp] | Freq: Two times a day (BID) | OPHTHALMIC | Status: DC
  Administered 2013-06-25 – 2013-06-26 (×3): 1 [drp] via OPHTHALMIC
  Filled 2013-06-25: qty 5

## 2013-06-25 MED ORDER — PANTOPRAZOLE SODIUM 40 MG PO TBEC
40.0000 mg | DELAYED_RELEASE_TABLET | Freq: Every day | ORAL | Status: DC
  Administered 2013-06-25 – 2013-06-26 (×2): 40 mg via ORAL
  Filled 2013-06-25 (×2): qty 1

## 2013-06-25 MED ORDER — MORPHINE SULFATE 2 MG/ML IJ SOLN: 2.0000 mg | INTRAMUSCULAR | Status: DC | PRN

## 2013-06-25 MED ORDER — ZOLPIDEM TARTRATE 5 MG PO TABS
5.0000 mg | ORAL_TABLET | Freq: Every day | ORAL | Status: DC
  Administered 2013-06-25: 5 mg via ORAL
  Filled 2013-06-25: qty 1

## 2013-06-25 MED ORDER — VANCOMYCIN HCL IN DEXTROSE 1-5 GM/200ML-% IV SOLN
1000.0000 mg | Freq: Once | INTRAVENOUS | Status: AC
  Administered 2013-06-25: 1000 mg via INTRAVENOUS
  Filled 2013-06-25: qty 200

## 2013-06-25 MED ORDER — DOCUSATE SODIUM 100 MG PO CAPS
100.0000 mg | ORAL_CAPSULE | Freq: Two times a day (BID) | ORAL | Status: DC
  Administered 2013-06-25 – 2013-06-26 (×2): 100 mg via ORAL
  Filled 2013-06-25 (×3): qty 1

## 2013-06-25 MED ORDER — ONDANSETRON HCL 4 MG PO TABS
4.0000 mg | ORAL_TABLET | Freq: Four times a day (QID) | ORAL | Status: DC | PRN
  Administered 2013-06-26: 4 mg via ORAL
  Filled 2013-06-25: qty 1

## 2013-06-25 MED ORDER — DOCUSATE SODIUM 100 MG PO CAPS
100.0000 mg | ORAL_CAPSULE | Freq: Two times a day (BID) | ORAL | Status: DC
  Filled 2013-06-25: qty 1

## 2013-06-25 MED ORDER — PROCHLORPERAZINE MALEATE 10 MG PO TABS
10.0000 mg | ORAL_TABLET | Freq: Four times a day (QID) | ORAL | Status: DC | PRN
  Administered 2013-06-26: 10 mg via ORAL
  Filled 2013-06-25: qty 1

## 2013-06-25 MED ORDER — DEXAMETHASONE 4 MG PO TABS
4.0000 mg | ORAL_TABLET | Freq: Every day | ORAL | Status: DC
  Administered 2013-06-25 – 2013-06-26 (×2): 4 mg via ORAL
  Filled 2013-06-25 (×2): qty 1

## 2013-06-25 MED ORDER — ENOXAPARIN SODIUM 40 MG/0.4ML ~~LOC~~ SOLN
40.0000 mg | SUBCUTANEOUS | Status: DC
  Administered 2013-06-25 – 2013-06-26 (×2): 40 mg via SUBCUTANEOUS
  Filled 2013-06-25 (×2): qty 0.4

## 2013-06-25 MED ORDER — CEFEPIME HCL 2 G IJ SOLR
2.0000 g | Freq: Once | INTRAMUSCULAR | Status: AC
  Administered 2013-06-25: 2 g via INTRAVENOUS
  Filled 2013-06-25: qty 2

## 2013-06-25 MED ORDER — DEXTROSE 5 % IV SOLN
2.0000 g | Freq: Three times a day (TID) | INTRAVENOUS | Status: DC
  Administered 2013-06-25 – 2013-06-26 (×3): 2 g via INTRAVENOUS
  Filled 2013-06-25 (×5): qty 2

## 2013-06-25 MED ORDER — TOBRAMYCIN-DEXAMETHASONE 0.3-0.1 % OP SUSP
1.0000 [drp] | Freq: Every day | OPHTHALMIC | Status: DC
  Administered 2013-06-25 – 2013-06-26 (×2): 1 [drp] via OPHTHALMIC
  Filled 2013-06-25: qty 2.5

## 2013-06-25 MED ORDER — TRAZODONE HCL 100 MG PO TABS
100.0000 mg | ORAL_TABLET | Freq: Every day | ORAL | Status: DC
  Administered 2013-06-25 (×2): 100 mg via ORAL
  Filled 2013-06-25 (×2): qty 1

## 2013-06-25 MED ORDER — ACETAMINOPHEN 325 MG PO TABS
650.0000 mg | ORAL_TABLET | Freq: Four times a day (QID) | ORAL | Status: DC | PRN
  Administered 2013-06-26: 650 mg via ORAL
  Filled 2013-06-25: qty 2

## 2013-06-25 MED ORDER — ONDANSETRON HCL 4 MG/2ML IJ SOLN: 4.0000 mg | Freq: Four times a day (QID) | INTRAMUSCULAR | Status: DC | PRN

## 2013-06-25 NOTE — H&P (Signed)
[facility]                                                                                    Patient Demographics  Carrie Berry, is a 63 y.o. female  CSN: [id number]  MRN: [id number]  DOB - [birth year]  Admit Date - 06/24/2013  Outpatient Primary MD for the patient is [name], MD   With History of -  Past Medical History  Diagnosis Date  . Recurrent sinus infections   . Arthritis   . PONV (postoperative nausea and vomiting)   . Anxiety   . Cancer     breast  . Breast cancer   . GERD (gastroesophageal reflux disease)       Past Surgical History  Procedure Laterality Date  . Tubal ligation    . Tubal ligation  1985  . Portacath placement N/A 01/10/2013    Procedure: ULTRASOUND GUIDED PORT-A-CATH INSERTION WITH FLUOROSCOPY;  Surgeon: [name], MD;  Location: [place];  Service: General;  Laterality: N/A;    in for   Chief Complaint  Patient presents with  . Fever     HPI  Carrie Berry  is a 63 y.o. female, with a history of breast cancer, metastatic on chemotherapy presenting today with fever, sore throat and dry cough for the last 2 days. She received IV fluids today for dehydration at her oncologist office however she started getting worse and she noted a fever of 10 3 at night. The patient came to the emergency room and was discharged yesterday to followup with her temperature. To the patient had chills and I was called to admit for febrile neutropenia after her white blood cell count was noted to be 0.3.    Review of Systems    In addition to the HPI above,  No Headache, No changes with Vision or hearing, No problems swallowing food or Liquids, No Chest pain, Cough or Shortness of Breath, No Abdominal pain, had nausea today but no vomiting, Bowel movements are regular, No Blood in stool or Urine, No dysuria, No new skin rashes or bruises, No new joints pains-aches,  No new weakness, tingling, numbness in any extremity, No  recent weight gain or loss, No polyuria, polydypsia or polyphagia, No significant Mental Stressors.  A full 10 point Review of Systems was done, except as stated above, all other Review of Systems were negative.   Social History History  Substance Use Topics  . Smoking status: Never Smoker   . Smokeless tobacco: Never Used  . Alcohol Use: No     Family History Family History  Problem Relation Age of Onset  . Bladder Cancer Father   . Colon cancer Maternal Grandmother      Prior to Admission medications   Medication Sig Start Date End Date Taking? Authorizing Provider  acetaminophen (TYLENOL) 325 MG tablet Take 650 mg by mouth every 8 (eight) hours as needed for pain.   Yes Historical Provider, MD  aspirin-acetaminophen-caffeine (EXCEDRIN MIGRAINE) 548 120 6162 MG per tablet Take 1 tablet by mouth every 6 (six) hours as needed for headache.   Yes Historical Provider, MD  dexamethasone (DECADRON) 4 MG tablet Take  2 tablets by mouth once a day on the day after chemotherapy and then take 2 tablets two times a day for 2 days. Take with food. 05/12/13  Yes Chauncey Cruel, MD  lidocaine-prilocaine (EMLA) cream Apply topically as needed. 04/02/13  Yes Amy Milda Smart, PA-C  omeprazole (PRILOSEC) 20 MG capsule Take 1 capsule (20 mg total) by mouth 2 (two) times daily. 03/19/13  Yes Amy Milda Smart, PA-C  ondansetron (ZOFRAN ODT) 4 MG disintegrating tablet 2mg  ODT q4 hours prn vomiting 06/23/13  Yes Marissa Sciacca, PA-C  ondansetron (ZOFRAN) 8 MG tablet Take 8 mg by mouth every 8 (eight) hours as needed for nausea or vomiting.   Yes Historical Provider, MD  PATADAY 0.2 % SOLN Place 1 drop into both eyes daily as needed (dry eyes).  10/29/12  Yes Historical Provider, MD  prochlorperazine (COMPAZINE) 10 MG tablet Take 1 tablet (10 mg total) by mouth every 6 (six) hours as needed for nausea. 05/12/13  Yes Chauncey Cruel, MD  tobramycin-dexamethasone [facility]) ophthalmic solution Place 1 drop into  both eyes daily. 04/29/13  Yes Amy Milda Smart, PA-C  traMADol (ULTRAM) 50 MG tablet Take 1 tablet (50 mg total) by mouth every 6 (six) hours as needed for moderate pain. 05/12/13  Yes Chauncey Cruel, MD  traZODone (DESYREL) 50 MG tablet Take 100 mg by mouth. Every other night at bedtime as needed  for sleep   Yes Historical Provider, MD  triamcinolone (NASACORT) 55 MCG/ACT nasal inhaler Place 2 sprays into the nose daily.   Yes Historical Provider, MD  zolpidem (AMBIEN) 5 MG tablet Take 5 mg by mouth. Every other night at bedtime as needed for sleep   Yes Historical Provider, MD    Allergies  Allergen Reactions  . Ondansetron Hcl Other (See Comments)    HEADACHE, patient states she still takes   . Codeine Nausea And Vomiting    "violently ill"  . Penicillins Rash    Childhood reaction -    Physical Exam  Vitals  Blood pressure 109/70, pulse 112, temperature 100.3 F (37.9 C), temperature source Oral, resp. rate 18, SpO2 98.00%.   1. General elderly female looks very tired  2. Normal affect and insight, Not Suicidal or Homicidal, Awake Alert, Oriented X 3.  3. No F.N deficits, ALL C.Nerves Intact, Strength 5/5 all 4 extremities, Sensation intact all 4 extremities, Plantars down going.  4. Ears and Eyes appear Normal, Conjunctivae clear, PERRLA. Moist Oral Mucosa.  5. Supple Neck, No JVD, No cervical lymphadenopathy appriciated, No Carotid Bruits.  6. Symmetrical Chest wall movement, Good air movement bilaterally, CTAB.  7. RRR, No Gallops, Rubs or Murmurs, No Parasternal Heave.  8. Positive Bowel Sounds, Abdomen Soft, Non tender, No organomegaly appriciated,No rebound -guarding or rigidity.  9.  No Cyanosis, Normal Skin Turgor, No Skin Rash or Bruise.  10. Good muscle tone,  joints appear normal , no effusions, Normal ROM.  11. No Palpable Lymph Nodes in Neck or Axillae    Data Review  CBC  Recent Labs Lab 06/23/13 1858 06/24/13 1050 06/24/13 2245  WBC 0.4*  0.3* 0.3*  HGB 10.6* 10.5* 9.2*  HCT 31.3* 31.2* 26.6*  PLT 181 149 158  MCV 87.7 88.6 88.1  MCH 29.7 29.8 30.5  MCHC 33.9 33.7 34.6  RDW 14.7 14.9* 14.6  LYMPHSABS 0.3* 0.2* 0.2*  MONOABS 0.0* 0.1 0.1  EOSABS 0.0 0.0 0.0  BASOSABS 0.0 0.0 0.0   ------------------------------------------------------------------------------------------------------------------  Chemistries   Recent Labs  Lab 06/23/13 1858 06/24/13 1050 06/24/13 2245  NA 133* 135* 135*  K 4.1 3.9 4.1  CL 94*  --  98  CO2 26 25 26   GLUCOSE 99 121 100*  BUN 15 12.7 9  CREATININE 0.67 0.7 0.76  CALCIUM 9.0 9.6 9.0  AST 17 15 22   ALT 21 25 28   ALKPHOS 138* 126 117  BILITOT 0.5 0.58 0.3     ---------------------------------------------------------------------------------------------------------------  Urinalysis    Component Value Date/Time   COLORURINE YELLOW 06/23/2013 2002   APPEARANCEUR CLEAR 06/23/2013 2002   LABSPEC 1.011 06/23/2013 2002   PHURINE 6.5 06/23/2013 2002   GLUCOSEU NEGATIVE 06/23/2013 2002   HGBUR NEGATIVE 06/23/2013 2002   [place] 06/23/2013 2002   [place] 06/23/2013 2002   PROTEINUR NEGATIVE 06/23/2013 2002   UROBILINOGEN 0.2 06/23/2013 2002   NITRITE NEGATIVE 06/23/2013 2002   LEUKOCYTESUR NEGATIVE 06/23/2013 2002    ----------------------------------------------------------------------------------------------------------------  Imaging results:   Dg Chest 2 View  06/24/2013   CLINICAL DATA:  Fever.  Cough for 1 day.  EXAM: CHEST  2 VIEW  COMPARISON:  DG ABD ACUTE W/CHEST dated 06/23/2013; CT CHEST W/CM dated 03/17/2013; CT CHEST W/CM dated 05/16/2013  FINDINGS: Pulmonary nodules demonstrated on prior CT are difficult to visualize radiographically. Right IJ Port-A-Cath appears unchanged. Chronic bronchitic changes at the bases.  No airspace disease or pleural effusion identified. No interval change. Cardiopericardial silhouette is within normal limits.  IMPRESSION: No interval  change or acute abnormality. Pulmonary nodules demonstrated on prior chest CT compatible with metastatic disease are difficult to resolve radiographically.   Electronically Signed   By: Dereck Ligas M.D.   On: 06/24/2013 23:03   Dg Abd Acute W/chest  06/23/2013   CLINICAL DATA:  Umbilical pain. History of breast cancer with pulmonary metastases.  EXAM: ACUTE ABDOMEN SERIES (ABDOMEN 2 VIEW & CHEST 1 VIEW)  COMPARISON:  CT chest 05/16/2013  FINDINGS: Port-A-Cath remains in place. Pulmonary nodules are noted but better demonstrated on CT scan. The lungs are otherwise clear. Heart size is normal. No pneumothorax or pleural fluid.  Two views of the abdomen show no free intraperitoneal air. The bowel gas pattern is normal.  IMPRESSION: No acute finding chest or abdomen. Pulmonary nodules consistent with metastatic breast cancer are noted as seen on prior CT.   Electronically Signed   By: Inge Rise M.D.   On: 06/23/2013 20:49     Assessment & Plan  1. febrile neutropenia  2. History of breast cancer stage IV on chemotherapy Plan IV vancomycin and cefepime Consult hematology/oncology in a.m.   DVT Prophylaxis Lovenox  AM Labs Ordered, also please review Full Orders  Code Status full  Disposition Plan: Home  Time spent in minutes : 32 min  Condition GUARDED

## 2013-06-25 NOTE — Consult Note (Signed)
[place]  [phone] [phone]     ID: Carrie Berry OB: Oct 06, 1950  MR#: [id number]  ZTI#:[id number]  PCP: [name], MD GYN:   SU: Tood Rosenbower OTHER MD: Thea Silversmith, Earle Gell, Erline Levine  CHIEF COMPLAINT: "I had a temperature."  INTERVAL HISTORY: Carrie Berry has had a rough time after her 3d cycle of cyclophosphamide/ doxorubicin chemotherapy. In particular she developed significant nausea and vomiting starting 06/20/2013. She required IVF in the ED and in the office 02/09. That same night she developed a temperature to 103 degrees. Evaluation in the ED showed severe neutropenia and she was admitted for IF ABX and further workup.  REVIEW OF SYSTEMS: Aside from the nausea and vomiting, which are now improved, Carrie Berry has had a mild cough not productive of phlegm for several days. She notes that the breast mass we have been following is clearly smaller and her pain is "gone." There have been no significant headaches, dizzyness, or visual changes. She denies dysuria or hematuria, diarrhea or cosntipation. A detailed review of systems today was otherwise stable.  HISTORY OF PRESENT ILLNESS: Carrie Berry noted a mass in her right breast December of 2013, but did not think much of it. It did grow some lower the summer, but she was keeping her grandchild at that time and was too busy so she did not bring it to Dr. Bernell List attention until August. He set her up for bilateral mammography and ultrasonography at [facility] 12/18/2012 and this measured, on the right, a large irregular lobulated mass in the upper outer quadrant which by ultrasound measured 5.7 cm. The right axilla showed some lymph nodes with thickened cortices. In the left breast there was a focal asymmetry at the depth, lateral to the nipple, and by ultrasound there was a 9 mm ill-defined hypoechoic lesion in this location. Biopsy of the left breast lesion showed only fibrocystic changes.  Biopsy of  the right breast mass and right axillary adenopathy ([id number]) on 12/23/2012 showed both to be involved by invasive ductal carcinoma, grade 2, triple negative, with an MIB-1 of 86%.  MRI of the breast 12/27/2012 at [facility] imaging showed in the right breast a mass abutting the pectoralis muscle without enhancement of the muscle measuring 4.8 cm. The satellite nodule measuring approximately 3 mm was also noted superior to the mass and several abnormal and enlarged right axillary lymph nodes were noted, one of which appeared to be necrotic. The largest node measured 2.0 cm. Unfortunately, numerous bilateral pulmonary nodules were also noted.  The patient's subsequent history is as detailed below    PAST MEDICAL HISTORY: Past Medical History  Diagnosis Date  . Recurrent sinus infections   . Arthritis   . PONV (postoperative nausea and vomiting)   . Anxiety   . Cancer     breast  . Breast cancer   . GERD (gastroesophageal reflux disease)   . 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 Tubal ligation    . Tubal ligation  1985  . Portacath placement N/A 01/10/2013    Procedure: ULTRASOUND GUIDED PORT-A-CATH INSERTION WITH FLUOROSCOPY;  Surgeon: [name], MD;  Location: [place];  Service: General;  Laterality: N/A;    FAMILY HISTORY Family History  Problem Relation Age of Onset  . Bladder Cancer Father   . Colon cancer Maternal Grandmother   the patient's parents are living, both in their 75s. The patient's father  was diagnosed with bladder cancer the age of 1. The patient's mother's mother had some type of gastrointestinal cancer. The patient had one brother, no sisters. There is no history of breast or ovarian cancer in the family other than a cousin on the father's side who was diagnosed with breast cancer apparently before the age of 2.   GYNECOLOGIC HISTORY:  Menarche age 24,  first live birth age 15, the patient is [place] P2. She went through menopause in 2008. She did not take hormone replacement. She took birth control for approximately 22 years remotely.  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 She's currently on disability. Her husband Casara Churchill [name]"), is vice Development worker, international aid for [employer]. The patient's daughter [name] is a stay-at-home mom in [place], the patient's son Cassidy Patyk unfortunately died in an automobile accident in December of 2011. The patient has 2 grandchildren. She attends to a local [employer].     ADVANCED DIRECTIVES:    HEALTH MAINTENANCE: History  Substance Use Topics  . Smoking status: Never Smoker   . Smokeless tobacco: Never Used  . Alcohol Use: No     Allergies  Allergen Reactions  . Ondansetron Hcl Other (See Comments)    HEADACHE, patient states she still takes   . Codeine Nausea And Vomiting    "violently ill"  . Penicillins Rash    Childhood reaction -    Current Facility-Administered Medications  Medication Dose Route Frequency Provider Last Rate Last Dose  . acetaminophen (TYLENOL) tablet 650 mg  650 mg Oral Q6H PRN Merton Border, MD       Or  . acetaminophen (TYLENOL) suppository 650 mg  650 mg Rectal Q6H PRN Merton Border, MD      . acetaminophen (TYLENOL) tablet 650 mg  650 mg Oral Once Marissa Sciacca, PA-C      . albuterol (PROVENTIL) (2.5 MG/3ML) 0.083% nebulizer solution 2.5 mg  2.5 mg Nebulization Q2H PRN Merton Border, MD      . ceFEPIme (MAXIPIME) 2 g in dextrose 5 % 50 mL IVPB  2 g Intravenous 3 times per day Venetia Maxon Rama, MD   2 g at 06/25/13 1314  . dexamethasone (DECADRON) tablet 4 mg  4 mg Oral Daily Merton Border, MD   4 mg at 06/25/13 [id number]  . enoxaparin (LOVENOX) injection 40 mg  40 mg Subcutaneous Q24H Merton Border, MD   40 mg at 06/25/13 0930  . fluticasone (FLONASE) 50 MCG/ACT nasal spray 1  spray  1 spray Each Nare Daily Merton Border, MD   1 spray at 06/25/13 0929  . HYDROcodone-acetaminophen (NORCO/VICODIN) 5-325 MG per tablet 1-2 tablet  1-2 tablet Oral Q4H PRN Merton Border, MD      . lactose free nutrition (BOOST PLUS) liquid 237 mL  237 mL Oral BID BM Hazle Coca, RD   237 mL at 06/25/13 1143  . morphine 2 MG/ML injection 2 mg  2 mg Intravenous Q4H PRN Merton Border, MD      . olopatadine (PATANOL) 0.1 % ophthalmic solution 1 drop  1 drop Both Eyes BID Merton Border, MD   1 drop at 06/25/13 0927  . ondansetron (ZOFRAN) tablet 4 mg  4 mg Oral Q6H PRN Merton Border, MD       Or  . ondansetron (ZOFRAN) injection 4 mg  4 mg Intravenous Q6H PRN Merton Border, MD      .  pantoprazole (PROTONIX) EC tablet 40 mg  40 mg Oral Daily Merton Border, MD   40 mg at 06/25/13 1607  . prochlorperazine (COMPAZINE) tablet 10 mg  10 mg Oral Q6H PRN Merton Border, MD      . tobramycin-dexamethasone [facility]) ophthalmic suspension 1 drop  1 drop Both Eyes Daily Merton Border, MD   1 drop at 06/25/13 0929  . traMADol (ULTRAM) tablet 50 mg  50 mg Oral Q6H PRN Merton Border, MD   50 mg at 06/25/13 0155  . traZODone (DESYREL) tablet 100 mg  100 mg Oral QHS Merton Border, MD   100 mg at 06/25/13 0145  . vancomycin (VANCOCIN) IVPB 750 mg/150 ml premix  750 mg Intravenous Q12H Christina P Rama, MD      . zolpidem (AMBIEN) tablet 5 mg  5 mg Oral QHS Merton Border, MD      . zolpidem (AMBIEN) tablet 5 mg  5 mg Oral QHS PRN Merton Border, MD        OBJECTIVE: middle aged White [place] examined in bed Filed Vitals:   06/25/13 0508  BP: 105/62  Pulse: 90  Temp: 98.1 F (36.7 C)  Resp: 18     Body mass index is 22.2 kg/(m^2).    ECOG FS:2 - Symptomatic, <50% confined to bed  Ocular: Sclerae unicteric, EOMs intact Ear-nose-throat: Oropharynx clear and moist Lymphatic: No cervical or supraclavicular adenopathy Lungs no rales or rhonchi, auscultated anterolaterally Heart regular rate and rhythm, no murmur appreciated Abd soft,  nontender, positive bowel sounds MSK no upper extremity edema Neuro: non-focal, well-oriented, appropriate affect Breasts: deferred   LAB RESULTS:  CMP     Component Value Date/Time   NA 136* 06/25/2013 0338   NA 135* 06/24/2013 1050   K 4.0 06/25/2013 0338   K 3.9 06/24/2013 1050   CL 102 06/25/2013 0338   CO2 25 06/25/2013 0338   CO2 25 06/24/2013 1050   GLUCOSE 104* 06/25/2013 0338   GLUCOSE 121 06/24/2013 1050   BUN 8 06/25/2013 0338   BUN 12.7 06/24/2013 1050   CREATININE 0.67 06/25/2013 0338   CREATININE 0.7 06/24/2013 1050   CALCIUM 8.0* 06/25/2013 0338   CALCIUM 9.6 06/24/2013 1050   PROT 6.7 06/24/2013 2245   PROT 7.0 06/24/2013 1050   ALBUMIN 3.5 06/24/2013 2245   ALBUMIN 3.8 06/24/2013 1050   AST 22 06/24/2013 2245   AST 15 06/24/2013 1050   ALT 28 06/24/2013 2245   ALT 25 06/24/2013 1050   ALKPHOS 117 06/24/2013 2245   ALKPHOS 126 06/24/2013 1050   BILITOT 0.3 06/24/2013 2245   BILITOT 0.58 06/24/2013 1050   GFRNONAA >90 06/25/2013 0338   GFRAA >90 06/25/2013 0338    I No results found for this basename: SPEP, UPEP,  kappa and lambda light chains    Lab Results  Component Value Date   WBC 0.6* 06/25/2013   NEUTROABS 0.0* 06/24/2013   HGB 7.0* 06/25/2013   HCT 20.8* 06/25/2013   MCV 88.5 06/25/2013   PLT 127* 06/25/2013    @LASTCHEMISTRY @  No results found for this basename: LABCA2    No components found with this basename: LABCA125    No results found for this basename: INR,  in the last 168 hours  Urinalysis    Component Value Date/Time   COLORURINE YELLOW 06/25/2013 0011   APPEARANCEUR CLEAR 06/25/2013 0011   LABSPEC 1.007 06/25/2013 0011   PHURINE 6.5 06/25/2013 0011   GLUCOSEU NEGATIVE 06/25/2013 0011   HGBUR NEGATIVE 06/25/2013  0011   BILIRUBINUR NEGATIVE 06/25/2013 0011   KETONESUR NEGATIVE 06/25/2013 0011   PROTEINUR NEGATIVE 06/25/2013 0011   UROBILINOGEN 0.2 06/25/2013 0011   NITRITE NEGATIVE 06/25/2013 0011   LEUKOCYTESUR NEGATIVE 06/25/2013 0011     STUDIES: Dg Chest 2 View  06/24/2013   CLINICAL DATA:  Fever.  Cough for 1 day.  EXAM: CHEST  2 VIEW  COMPARISON:  DG ABD ACUTE W/CHEST dated 06/23/2013; CT CHEST W/CM dated 03/17/2013; CT CHEST W/CM dated 05/16/2013  FINDINGS: Pulmonary nodules demonstrated on prior CT are difficult to visualize radiographically. Right IJ Port-A-Cath appears unchanged. Chronic bronchitic changes at the bases.  No airspace disease or pleural effusion identified. No interval change. Cardiopericardial silhouette is within normal limits.  IMPRESSION: No interval change or acute abnormality. Pulmonary nodules demonstrated on prior chest CT compatible with metastatic disease are difficult to resolve radiographically.   Electronically Signed   By: Dereck Ligas M.D.   On: 06/24/2013 23:03   Dg Abd Acute W/chest  06/23/2013   CLINICAL DATA:  Umbilical pain. History of breast cancer with pulmonary metastases.  EXAM: ACUTE ABDOMEN SERIES (ABDOMEN 2 VIEW & CHEST 1 VIEW)  COMPARISON:  CT chest 05/16/2013  FINDINGS: Port-A-Cath remains in place. Pulmonary nodules are noted but better demonstrated on CT scan. The lungs are otherwise clear. Heart size is normal. No pneumothorax or pleural fluid.  Two views of the abdomen show no free intraperitoneal air. The bowel gas pattern is normal.  IMPRESSION: No acute finding chest or abdomen. Pulmonary nodules consistent with metastatic breast cancer are noted as seen on prior CT.   Electronically Signed   By: Inge Rise M.D.   On: 06/23/2013 20:49    ASSESSMENT: 63 y.o. [name], [place] woman admitted with poorly controlled nausea, dehydration, and neutropenic fever  (0) status post right breast upper outer quadrant and right axillary lymph node biopsy 12/23/2012 for a clinical T3 N1 M1, stage IV invasive ductal carcinoma, grade 3, triple negative, with an MIB-1 of 86%.  (1) right liver lobe biopsy 01/21/2013 confirms metastatic adenocarcinoma; scans show involvement of the liver, lungs,  and likely bone.   (2) enrolled in [facility] study [id number] (docetaxel + oral gamma secretase inibitor [id number]), received one cycle starting 02/04/2013 but withdrew because of poor tolerance despite treatment interruptions and decreased dosing of the oral component   (3) chest CT in early November 2014 was compared with studies in August 2014 and showed interval progression of metastatic breast cancer, with an enlarging primary right breast mass, enlarging right axillary lymph nodes, enlarging pulmonary nodules and new/enlarging hepatic metastases.   (4) Abraxane started 03/19/2013, given day 1 and day 8 of each 21 day cycle, stopped with the day 1 cycle 3 dose (04/29/2013) because of local progression of disease   (5) cyclophosphamide and doxorubicin started 05/20/2013, given in dose dense fashion with Neulasta support on day 2. She is currently DAY 9 CYCLE 3 on these agents, with clinical evidence of response    PLAN: Mckala is already feeling a bit better on hydration and antibiotics. The question will be whether to plan to complete the 4th cycle of CA chemotherapy or change. Will write for a chest CT this admission once she is more stable. Greatly appreciated hospitalist service's excellent care of this patient!  Chauncey Cruel, MD   06/25/2013 1:22 PM

## 2013-06-25 NOTE — Care Management Note (Signed)
   CARE MANAGEMENT NOTE 06/25/2013  Patient:  Carrie Berry, Carrie Berry   Account Number:  [id number]  Date Initiated:  06/25/2013  Documentation initiated by:  Havanah Nelms  Subjective/Objective Assessment:   63 yo female  Febrile neutropenia / chemotherapy-induced neutropenia.     Action/Plan:   Home when stable   Anticipated DC Date:     Anticipated DC Plan:  [place]  CM consult      Choice offered to / List presented to:  NA   DME arranged  NA      DME agency  NA     [place] arranged  NA      [place] agency  NA   Status of service:  In process, will continue to follow Medicare Important Message given?   (If response is "NO", the following Medicare IM given date fields will be blank) Date Medicare IM given:   Date Additional Medicare IM given:    Discharge Disposition:    Per UR Regulation:  Reviewed for med. necessity/level of care/duration of stay  If discussed at Eugenio Saenz of Stay Meetings, dates discussed:    Comments:  06/25/13 [place] Chart reviewed for utilization of services. No needs identified at this time.

## 2013-06-25 NOTE — Telephone Encounter (Signed)
Per staff message and POF I have scheduled appts.  JMW  

## 2013-06-25 NOTE — Progress Notes (Signed)
ANTIBIOTIC CONSULT NOTE - INITIAL  Pharmacy Consult for vancomycin/cefepime Indication: Febrile neutropenia  Allergies  Allergen Reactions  . Ondansetron Hcl Other (See Comments)    HEADACHE, patient states she still takes   . Codeine Nausea And Vomiting    "violently ill"  . Penicillins Rash    Childhood reaction -    Patient Measurements: Height: 5\' 4"  (162.6 cm) Weight: 129 lb 6.4 oz (58.695 kg) IBW/kg (Calculated) : 54.7 Adjusted Body Weight:   Vital Signs: Temp: 100.3 F (37.9 C) (02/11 0045) Temp src: Oral (02/11 0045) BP: 131/59 mmHg (02/11 0045) Pulse Rate: 107 (02/11 0045) Intake/Output from previous day:   Intake/Output from this shift:    Labs:  Recent Labs  06/23/13 1858 06/24/13 1050 06/24/13 1050 06/24/13 2245  WBC 0.4* 0.3*  --  0.3*  HGB 10.6* 10.5*  --  9.2*  PLT 181 149  --  158  CREATININE 0.67  --  0.7 0.76   Estimated Creatinine Clearance: 64.6 ml/min (by C-G formula based on Cr of 0.76). No results found for this basename: VANCOTROUGH, VANCOPEAK, VANCORANDOM, GENTTROUGH, GENTPEAK, GENTRANDOM, TOBRATROUGH, TOBRAPEAK, TOBRARND, AMIKACINPEAK, AMIKACINTROU, AMIKACIN,  in the last 72 hours   Microbiology: No results found for this or any previous visit (from the past 720 hour(s)).  Medical History: Past Medical History  Diagnosis Date  . Recurrent sinus infections   . Arthritis   . PONV (postoperative nausea and vomiting)   . Anxiety   . Cancer     breast  . Breast cancer   . GERD (gastroesophageal reflux disease)     Medications:  Anti-infectives   Start     Dose/Rate Route Frequency Ordered Stop   06/25/13 1800  vancomycin (VANCOCIN) IVPB 750 mg/150 ml premix     750 mg 150 mL/hr over 60 Minutes Intravenous Every 12 hours 06/25/13 0252     06/25/13 1400  ceFEPIme (MAXIPIME) 2 g in dextrose 5 % 50 mL IVPB     2 g 100 mL/hr over 30 Minutes Intravenous 3 times per day 06/25/13 0252     06/25/13 0245  vancomycin (VANCOCIN) IVPB  1000 mg/200 mL premix     1,000 mg 200 mL/hr over 60 Minutes Intravenous  Once 06/25/13 0237     06/25/13 0245  ceFEPIme (MAXIPIME) 2 g in dextrose 5 % 50 mL IVPB     2 g 100 mL/hr over 30 Minutes Intravenous  Once 06/25/13 4098       Assessment: Patient with febrile neutropenia.  First dose of antibiotics already sent to floor.  Goal of Therapy:  Vancomycin trough level 15-20 mcg/ml Cefepime dosed based on patient weight and renal function   Plan:  Measure antibiotic drug levels at steady state Follow up culture results Vancomycin 750mg  iv q12hr, cefepime 2gm iv q8hr  Tyler Deis, [name] [place] 06/25/2013,2:54 AM

## 2013-06-25 NOTE — Progress Notes (Signed)
PROGRESS NOTE   Carrie Berry IEP:[id number] DOB: [birth year] DOA: 06/24/2013 PCP: [name], MD  Brief narrative: Carrie Berry is an 63 y.o. female with a PMH of metastatic breast cancer diagnosed 12/2012, status post cycle 3 out of 4 planned cycles of chemotherapy, last dose given 06/17/13, was admitted on 06/25/13 with febrile neutropenia.  Assessment/Plan: Principal Problem:   Febrile neutropenia / chemotherapy-induced neutropenia Patient was admitted and placed on broad-spectrum antibiotics with cefepime and vancomycin. She does have an implanted Port-A-Cath. She received Neulasta on 06/18/13. Followup rapid strep screen, sputum and urine culture. No evidence of pneumonia on chest radiography. Active Problems:   Chemotherapy-induced pancytopenia Secondary to the sequela of recent chemotherapy. Hemoglobin 7 mg/dL. Patient prefers to avoid transfusion if possible. Recheck CBC in the morning. Platelet count stable with no signs of bleeding.   Breast cancer metastasized to multiple sites We'll notify the patient's oncologist of her admission.   GERD (gastroesophageal reflux disease) Continue Protonix.   DVT Prophylaxis Continue Lovenox.  Code Status: Full. Family Communication: No family at bedside. Disposition Plan: Home when stable.   IV access:  Port-A-Cath  Medical Consultants:  Dr. Tressa Busman, Oncology  Other Consultants:  None  Anti-infectives:  Vancomycin 06/25/13--->  Cefepime 06/25/13--->  HPI/Subjective: Carrie Berry denies shortness of breath or cough. She does have some mild nasal congestion. Appetite is good. No nausea or vomiting. Had some recent abdominal pain, but none this morning. Bowels are moving normally.  Objective: Filed Vitals:   06/24/13 2147 06/24/13 2308 06/25/13 0045 06/25/13 0508  BP: 104/56 109/70 131/59 105/62  Pulse: 108 112 107 90  Temp: 99.7 F (37.6 C) 100.3 F (37.9 C) 100.3 F (37.9 C) 98.1 F (36.7 C)  TempSrc: Oral  Oral Oral Oral  Resp: 18 18 18 18   Height:   5\' 4"  (1.626 m)   Weight:   58.695 kg (129 lb 6.4 oz)   SpO2: 98% 98% 98% 95%   No intake or output data in the 24 hours ending 06/25/13 0736  Exam: Gen:  NAD Cardiovascular:  RRR, No M/R/G Respiratory:  Lungs CTAB Gastrointestinal:  Abdomen soft, NT/ND, + BS Extremities:  No C/E/C  Data Reviewed: Basic Metabolic Panel:  Recent Labs Lab 06/23/13 1858 06/24/13 1050 06/24/13 2245 06/25/13 0338  NA 133* 135* 135* 136*  K 4.1 3.9 4.1 4.0  CL 94*  --  98 102  CO2 26 25 26 25   GLUCOSE 99 121 100* 104*  BUN 15 12.7 9 8   CREATININE 0.67 0.7 0.76 0.67  CALCIUM 9.0 9.6 9.0 8.0*   GFR Estimated Creatinine Clearance: 64.6 ml/min (by C-G formula based on Cr of 0.67). Liver Function Tests:  Recent Labs Lab 06/23/13 1858 06/24/13 1050 06/24/13 2245  AST 17 15 22   ALT 21 25 28   ALKPHOS 138* 126 117  BILITOT 0.5 0.58 0.3  PROT 6.8 7.0 6.7  ALBUMIN 3.7 3.8 3.5    Recent Labs Lab 06/23/13 1858  LIPASE 21   CBC:  Recent Labs Lab 06/23/13 1858 06/24/13 1050 06/24/13 2245 06/25/13 0338  WBC 0.4* 0.3* 0.3* 0.6*  NEUTROABS 0.1* 0.0* 0.0*  --   HGB 10.6* 10.5* 9.2* 7.0*  HCT 31.3* 31.2* 26.6* 20.8*  MCV 87.7 88.6 88.1 88.5  PLT 181 149 158 127*   Microbiology No results found for this or any previous visit (from the past 240 hour(s)).   Procedures and Diagnostic Studies: Dg Chest 2 View  06/24/2013   CLINICAL  DATA:  Fever.  Cough for 1 day.  EXAM: CHEST  2 VIEW  COMPARISON:  DG ABD ACUTE W/CHEST dated 06/23/2013; CT CHEST W/CM dated 03/17/2013; CT CHEST W/CM dated 05/16/2013  FINDINGS: Pulmonary nodules demonstrated on prior CT are difficult to visualize radiographically. Right IJ Port-A-Cath appears unchanged. Chronic bronchitic changes at the bases.  No airspace disease or pleural effusion identified. No interval change. Cardiopericardial silhouette is within normal limits.  IMPRESSION: No interval change or acute  abnormality. Pulmonary nodules demonstrated on prior chest CT compatible with metastatic disease are difficult to resolve radiographically.   Electronically Signed   By: Dereck Ligas M.D.   On: 06/24/2013 23:03   Dg Abd Acute W/chest  06/23/2013   CLINICAL DATA:  Umbilical pain. History of breast cancer with pulmonary metastases.  EXAM: ACUTE ABDOMEN SERIES (ABDOMEN 2 VIEW & CHEST 1 VIEW)  COMPARISON:  CT chest 05/16/2013  FINDINGS: Port-A-Cath remains in place. Pulmonary nodules are noted but better demonstrated on CT scan. The lungs are otherwise clear. Heart size is normal. No pneumothorax or pleural fluid.  Two views of the abdomen show no free intraperitoneal air. The bowel gas pattern is normal.  IMPRESSION: No acute finding chest or abdomen. Pulmonary nodules consistent with metastatic breast cancer are noted as seen on prior CT.   Electronically Signed   By: Inge Rise M.D.   On: 06/23/2013 20:49    Scheduled Meds: . acetaminophen  650 mg Oral Once  . ceFEPime (MAXIPIME) IV  2 g Intravenous 3 times per day  . dexamethasone  4 mg Oral Daily  . enoxaparin (LOVENOX) injection  40 mg Subcutaneous Q24H  . fluticasone  1 spray Each Nare Daily  . olopatadine  1 drop Both Eyes BID  . pantoprazole  40 mg Oral Daily  . tobramycin-dexamethasone  1 drop Both Eyes Daily  . traZODone  100 mg Oral QHS  . vancomycin  750 mg Intravenous Q12H  . zolpidem  5 mg Oral QHS   Continuous Infusions:   Time spent: 35 minutes with > 50% of time discussing current diagnostic test results, clinical impression and plan of care.    LOS: 1 day   [name]  [facility] Pager [phone]. If unable to reach me by pager, please call my cell phone at [phone].  *Please note that the hospitalists switch teams on Wednesdays. Please call the flow manager at [phone] if you are having difficulty reaching the hospitalist taking care of this patient as she can update you and provide the most up-to-date  pager number of provider caring for the patient. If 8PM-8AM, please contact night-coverage at [email or web address], password [facility]  06/25/2013, 7:36 AM    **Disclaimer: This note was dictated with voice recognition software. Similar sounding words can inadvertently be transcribed and this note may contain transcription errors which may not have been corrected upon publication of note.**

## 2013-06-25 NOTE — Progress Notes (Signed)
INITIAL NUTRITION ASSESSMENT  DOCUMENTATION CODES Per approved criteria  -Not Applicable   INTERVENTION: Recommend Boost BID Encouraged PO intake Will continue to monitor  NUTRITION DIAGNOSIS: Increased nutrient needs (protein/kcal) related to increased demand for nutrient as evidenced by chronic disease-breast cancer w/mets.   Goal: Pt to meet >/= 90% of their estimated nutrition needs    Monitor:  Total protein/energy intake, labs, weights  Reason for Assessment: MST  63 y.o. female  Admitting Dx: Febrile neutropenia  ASSESSMENT: Carrie Berry is a 63 y.o. female, with a history of breast cancer, metastatic on chemotherapy presenting today with fever, sore throat and dry cough for the last 2 days. She received IV fluids today for dehydration at her oncologist office however she started getting worse and she noted a fever of 10 3 at night. The patient came to the emergency room and was discharged yesterday to followup with her temperature   -Pt denied any significant changes in appetite or weight since beginning of chemo treatments -Has lost approximately 6-8 lbs since 12/2012. Able to maintain weight around 129 lbs -Denied any nausea/vomiting. Had some abd pain, but this had improved during admit. Consumed >75% of breakfast -Diet recall indicated small breakfast, snacks of Boost/granola bars/fruits, and usually chicken dishes for lunch and dinner.  -Drinks Boost BID and would like to continue regimen. Noted some foods have lost their taste, but is still able to find foods that are palatable -Husband appeared concerned that pt may have suboptimal intake, was pleased with PO intake of breakfast. Encourage frequent snacks and continuing Boost supplement for weight maintenance    Height: Ht Readings from Last 1 Encounters:  06/25/13 5\' 4"  (1.626 m)    Weight: Wt Readings from Last 1 Encounters:  06/25/13 129 lb 6.4 oz (58.695 kg)    Ideal Body Weight: 120 lbs  % Ideal  Body Weight: 108%  Wt Readings from Last 10 Encounters:  06/25/13 129 lb 6.4 oz (58.695 kg)  06/24/13 125 lb 12.8 oz (57.063 kg)  06/17/13 128 lb 11.2 oz (58.378 kg)  06/10/13 127 lb 3.2 oz (57.698 kg)  06/03/13 129 lb 14.4 oz (58.922 kg)  05/27/13 129 lb 9.6 oz (58.786 kg)  05/20/13 133 lb 12.8 oz (60.691 kg)  05/12/13 129 lb 11.2 oz (58.832 kg)  04/29/13 134 lb 4.8 oz (60.918 kg)  04/08/13 132 lb 3.2 oz (59.966 kg)    Usual Body Weight: 129 lbs  % Usual Body Weight: 100%  BMI:  Body mass index is 22.2 kg/(m^2).  Estimated Nutritional Needs: Kcal: 0350-0938 Protein: 70-80 gram Fluid: >/= 2000 ml/daily  Skin: WDL  Diet Order: General  EDUCATION NEEDS: -No education needs identified at this time   Intake/Output Summary (Last 24 hours) at 06/25/13 1114 Last data filed at 06/25/13 0900  Gross per 24 hour  Intake    240 ml  Output    550 ml  Net   -310 ml    Last BM: 2/10   Labs:   Recent Labs Lab 06/23/13 1858 06/24/13 1050 06/24/13 2245 06/25/13 0338  NA 133* 135* 135* 136*  K 4.1 3.9 4.1 4.0  CL 94*  --  98 102  CO2 26 25 26 25   BUN 15 12.7 9 8   CREATININE 0.67 0.7 0.76 0.67  CALCIUM 9.0 9.6 9.0 8.0*  GLUCOSE 99 121 100* 104*    CBG (last 3)  No results found for this basename: GLUCAP,  in the last 72 hours  Scheduled Meds: . acetaminophen  650 mg Oral Once  . ceFEPime (MAXIPIME) IV  2 g Intravenous 3 times per day  . dexamethasone  4 mg Oral Daily  . enoxaparin (LOVENOX) injection  40 mg Subcutaneous Q24H  . fluticasone  1 spray Each Nare Daily  . olopatadine  1 drop Both Eyes BID  . pantoprazole  40 mg Oral Daily  . tobramycin-dexamethasone  1 drop Both Eyes Daily  . traZODone  100 mg Oral QHS  . vancomycin  750 mg Intravenous Q12H  . zolpidem  5 mg Oral QHS    Continuous Infusions:   Past Medical History  Diagnosis Date  . Recurrent sinus infections   . Arthritis   . PONV (postoperative nausea and vomiting)   . Anxiety   .  Cancer     breast  . Breast cancer   . GERD (gastroesophageal reflux disease)   . Antineoplastic chemotherapy induced pancytopenia 06/25/2013    Past Surgical History  Procedure Laterality Date  . Tubal ligation    . Tubal ligation  1985  . Portacath placement N/A 01/10/2013    Procedure: ULTRASOUND GUIDED PORT-A-CATH INSERTION WITH FLUOROSCOPY;  Surgeon: [name], MD;  Location: [facility];  Service: General;  Laterality: N/A;    Atlee Abide MS RD LDN Clinical Dietitian [phone]

## 2013-06-26 LAB — CBC WITH DIFFERENTIAL/PLATELET
BASOS PCT: 2 % — AB (ref 0–1)
Basophils Absolute: 0.1 10*3/uL (ref 0.0–0.1)
EOS ABS: 0 10*3/uL (ref 0.0–0.7)
Eosinophils Relative: 0 % (ref 0–5)
HCT: 26.6 % — ABNORMAL LOW (ref 36.0–46.0)
HEMOGLOBIN: 9.1 g/dL — AB (ref 12.0–15.0)
LYMPHS PCT: 22 % (ref 12–46)
Lymphs Abs: 1.2 10*3/uL (ref 0.7–4.0)
MCH: 30.2 pg (ref 26.0–34.0)
MCHC: 34.2 g/dL (ref 30.0–36.0)
MCV: 88.4 fL (ref 78.0–100.0)
Monocytes Absolute: 0.9 10*3/uL (ref 0.1–1.0)
Monocytes Relative: 17 % — ABNORMAL HIGH (ref 3–12)
Neutro Abs: 3.1 10*3/uL (ref 1.7–7.7)
Neutrophils Relative %: 59 % (ref 43–77)
Platelets: 211 10*3/uL (ref 150–400)
RBC: 3.01 MIL/uL — ABNORMAL LOW (ref 3.87–5.11)
RDW: 14.8 % (ref 11.5–15.5)
WBC: 5.3 10*3/uL (ref 4.0–10.5)

## 2013-06-26 LAB — URINE CULTURE
CULTURE: NO GROWTH
Colony Count: NO GROWTH

## 2013-06-26 LAB — RAPID STREP SCREEN (MED CTR MEBANE ONLY): STREPTOCOCCUS, GROUP A SCREEN (DIRECT): NEGATIVE

## 2013-06-26 MED ORDER — CIPROFLOXACIN HCL 500 MG PO TABS: 500.0000 mg | ORAL_TABLET | Freq: Two times a day (BID) | ORAL | Status: DC

## 2013-06-26 NOTE — Discharge Instructions (Signed)
Neutropenia Neutropenia is a condition that occurs when the level of a certain type of white blood cell (neutrophil) in your body becomes lower than normal. Neutrophils are made in the bone marrow and fight infections. These cells protect against bacteria and viruses. The fewer neutrophils you have, and the longer your body remains without them, the greater your risk of getting a severe infection becomes. CAUSES  The cause of neutropenia may be hard to determine. However, it is usually due to 3 main problems:   Decreased production of neutrophils. This may be due to:  Certain medicines such as chemotherapy.  Genetic problems.  Cancer.  Radiation treatments.  Vitamin deficiency.  Some pesticides.  Increased destruction of neutrophils. This may be due to:  Overwhelming infections.  Hemolytic anemia. This is when the body destroys its own blood cells.  Chemotherapy.  Neutrophils moving to areas of the body where they cannot fight infections. This may be due to:  Dialysis procedures.  Conditions where the spleen becomes enlarged. Neutrophils are held in the spleen and are not available to the rest of the body.  Overwhelming infections. The neutrophils are held in the area of the infection and are not available to the rest of the body. SYMPTOMS  There are no specific symptoms of neutropenia. The lack of neutrophils can result in an infection, and an infection can cause various problems. DIAGNOSIS  Diagnosis is made by a blood test. A complete blood count is performed. The normal level of neutrophils in human blood differs with age and race. Infants have lower counts than older children and adults. African Americans have lower counts than Caucasians or Asians. The average adult level is 1500 cells/mm3 of blood. Neutrophil counts are interpreted as follows:  Greater than 1000 cells/mm3 gives normal protection against infection.  500 to 1000 cells/mm3 gives an increased risk for  infection.  200 to 500 cells/mm3 is a greater risk for severe infection.  Lower than 200 cells/mm3 is a marked risk of infection. This may require hospitalization and treatment with antibiotic medicines. TREATMENT  Treatment depends on the underlying cause, severity, and presence of infections or symptoms. It also depends on your health. Your caregiver will discuss the treatment plan with you. Mild cases are often easily treated and have a good outcome. Preventative measures may also be started to limit your risk of infections. Treatment can include:  Taking antibiotics.  Stopping medicines that are known to cause neutropenia.  Correcting nutritional deficiencies by eating green vegetables to supply folic acid and taking vitamin B supplements.  Stopping exposure to pesticides if your neutropenia is related to pesticide exposure.  Taking a blood growth factor called sargramostim, pegfilgrastim, or filgrastim if you are undergoing chemotherapy for cancer. This stimulates white blood cell production.  Removal of the spleen if you have Felty's syndrome and have repeated infections. HOME CARE INSTRUCTIONS   Follow your caregiver's instructions about when you need to have blood work done.  Wash your hands often. Make sure others who come in contact with you also wash their hands.  Wash raw fruits and vegetables before eating them. They can carry bacteria and fungi.  Avoid people with colds or spreadable (contagious) diseases (chickenpox, herpes zoster, influenza).  Avoid large crowds.  Avoid construction areas. The dust can release fungus into the air.  Be cautious around children in daycare or school environments.  Take care of your respiratory system by coughing and deep breathing.  Bathe daily.  Protect your skin from cuts and   burns.  Do not work in the garden or with flowers and plants.  Care for the mouth before and after meals by brushing with a soft toothbrush. If you have  mucositis, do not use mouthwash. Mouthwash contains alcohol and can dry out the mouth even more.  Clean the area between the genitals and the anus (perineal area) after urination and bowel movements. Women need to wipe from front to back.  Use a water soluble lubricant during sexual intercourse and practice good hygiene after. Do not have intercourse if you are severely neutropenic. Check with your caregiver for guidelines.  Exercise daily as tolerated.  Avoid people who were vaccinated with a live vaccine in the past 30 days. You should not receive live vaccines (polio, typhoid).  Do not provide direct care for pets. Avoid animal droppings. Do not clean litter boxes and bird cages.  Do not share food utensils.  Do not use tampons, enemas, or rectal suppositories unless directed by your caregiver.  Use an electric razor to remove hair.  Wash your hands after handling magazines, letters, and newspapers. SEEK IMMEDIATE MEDICAL CARE IF:   You have a fever.  You have chills or start to shake.  You feel nauseous or vomit.  You develop mouth sores.  You develop aches and pains.  You have redness and swelling around open wounds.  Your skin is warm to the touch.  You have pus coming from your wounds.  You develop swollen lymph nodes.  You feel weak or fatigued.  You develop red streaks on the skin. MAKE SURE YOU:  Understand these instructions.  Will watch your condition.  Will get help right away if you are not doing well or get worse. Document Released: 10/21/2001 Document Revised: 07/24/2011 Document Reviewed: 11/18/2010 Exit[employer] Patient Information 2014 [employer].  

## 2013-06-26 NOTE — Progress Notes (Signed)
Discharge instructions reviewed with patient and her husband and they demonstrated understanding via the teach back method.  Patient stable for discharge home.  Assessment unchanged from this am.  Zandra Abts [facility]  06/26/2013  7:54 PM

## 2013-06-26 NOTE — Discharge Summary (Signed)
Physician Discharge Summary  [name] TDD:[id number] DOB: [birth year] DOA: 06/24/2013  PCP: [name], MD  Admit date: 06/24/2013 Discharge date: 06/26/2013  Recommendations for Outpatient Follow-up:  1. F/U with Dr. [name] for further treatment recommendations.  Discharge Diagnoses:  Principal Problem:    Febrile neutropenia Active Problems:    Breast cancer metastasized to multiple sites    GERD (gastroesophageal reflux disease)    Chemotherapy induced neutropenia    Antineoplastic chemotherapy induced pancytopenia   Discharge Condition: Improved.  Diet recommendation: Regular.  History of present illness:  Carrie Berry is an 63 y.o. female with a PMH of metastatic breast cancer diagnosed 12/2012, status post cycle 3 out of 4 planned cycles of chemotherapy, last dose given 06/17/13, was admitted on 06/25/13 with febrile neutropenia.  Hospital Course by problem:  Principal Problem:   Febrile neutropenia / chemotherapy-induced neutropenia Patient was admitted and placed on broad-spectrum antibiotics with cefepime and vancomycin. She does have an implanted Port-A-Cath. She received Neulasta on 06/18/13 with recovery of her white blood cell count and resolution of her neutropenia today. Rapid strep screen negative, urine culture negative, sputum never sent secondary to lack of patient's ability to produce a sample. No evidence of pneumonia on chest radiography. Have instructed her to resume her Cipro for an additional 6 days of therapy. Active Problems:   Chemotherapy-induced pancytopenia Secondary to the sequela of recent chemotherapy. Hemoglobin improved at discharge with no current indication for transfusion. Platelet count normal at discharge.   Breast cancer metastasized to multiple sites Seen by oncology while in the hospital.   GERD (gastroesophageal reflux disease) Continue Protonix.   DVT Prophylaxis Treated with  Lovenox.  Procedures:  None  Consultations:  Dr. [name], Oncology  Discharge Exam: Filed Vitals:   06/26/13 0808  BP: 107/58  Pulse: 99  Temp: 98 F (36.7 C)  Resp: 17   Filed Vitals:   06/25/13 0508 06/25/13 1400 06/25/13 2133 06/26/13 0808  BP: 105/62 103/55 95/57 107/58  Pulse: 90 98 96 99  Temp: 98.1 F (36.7 C) 98.5 F (36.9 C) 98.9 F (37.2 C) 98 F (36.7 C)  TempSrc: Oral Oral Oral Oral  Resp: 18 16 15 17   Height:      Weight:      SpO2: 95% 99% 98% 99%    Gen:  NAD Cardiovascular:  RRR, No M/R/G Respiratory: Lungs CTAB Gastrointestinal: Abdomen soft, NT/ND with normal active bowel sounds. Extremities: No C/E/C   Discharge Instructions  Discharge Orders   Future Appointments Provider Department Dept Phone   06/27/2013 10:30 AM [facility] [phone]   06/27/2013 11:00 AM [facility] Covering Provider [facility] [phone]   06/27/2013 12:00 PM [facility] Procedure [facility] [phone]   07/01/2013 10:30 AM [facility] [phone]   07/01/2013 11:00 AM [facility] Covering Provider [facility] [phone]   07/01/2013 12:00 PM [facility] [phone]   07/02/2013 11:00 AM [facility] Inj Nurse [facility] [phone]   07/07/2013 12:00 PM [facility] [phone]   Liquids only 4 hours prior to your exam. Any medications can be taken as usual. Please arrive 15 min prior to your scheduled exam time.   07/08/2013 4:00 PM [facility] [phone]  07/08/2013 4:30 PM Chauncey Cruel, [facility] [phone]   Future Orders Complete By Expires   Activity as  tolerated - No restrictions  As directed    Call MD for:  persistant nausea and vomiting  As directed    Call MD for:  severe uncontrolled pain  As directed    Call MD for:  temperature >100.4  As directed    Diet general  As directed    Discharge instructions  As directed    Comments:     You were cared for by Dr. Jacquelynn Cree  (a hospitalist) during your hospital stay. If you have any questions about your discharge medications or the care you received while you were in the hospital after you are discharged, you can call the unit and ask to speak with the hospitalist on call if the hospitalist that took care of you is not available. Once you are discharged, your primary care physician will handle any further medical issues. Please note that NO REFILLS for any discharge medications will be authorized once you are discharged, as it is imperative that you return to your primary care physician (or establish a relationship with a primary care physician if you do not have one) for your aftercare needs so that they can reassess your need for medications and monitor your lab values.  Any outstanding tests can be reviewed by your PCP at your follow up visit.  It is also important to review any medicine changes with your PCP.  Please bring these d/c instructions with you to your next visit so your physician can review these changes with you.  If you do not have a primary care physician, you can call [phone] for a physician referral.  It is highly recommended that you obtain a PCP for hospital follow up.       Medication List         acetaminophen 325 MG tablet  Commonly known as:  TYLENOL  Take 650 mg by mouth every 8 (eight) hours as needed for pain.     aspirin-acetaminophen-caffeine [id number] MG per tablet  Commonly known as:  EXCEDRIN MIGRAINE  Take 1 tablet by mouth every 6 (six) hours as needed for headache.     ciprofloxacin 500 MG tablet  Commonly known as:  CIPRO  Take 1 tablet (500 mg  total) by mouth 2 (two) times daily.     dexamethasone 4 MG tablet  Commonly known as:  DECADRON  Take 2 tablets by mouth once a day on the day after chemotherapy and then take 2 tablets two times a day for 2 days. Take with food.     lidocaine-prilocaine cream  Commonly known as:  EMLA  Apply topically as needed.     omeprazole 20 MG capsule  Commonly known as:  PRILOSEC  Take 1 capsule (20 mg total) by mouth 2 (two) times daily.     ondansetron 4 MG disintegrating tablet  Commonly known as:  ZOFRAN ODT  2mg  ODT q4 hours prn vomiting     ondansetron 8 MG tablet  Commonly known as:  ZOFRAN  Take 8 mg by mouth every 8 (eight) hours as needed for nausea or vomiting.     PATADAY 0.2 % Soln  Generic drug:  Olopatadine HCl  Place 1 drop into both eyes daily as needed (dry eyes).     prochlorperazine 10 MG tablet  Commonly known as:  COMPAZINE  Take 1 tablet (10 mg  total) by mouth every 6 (six) hours as needed for nausea.     tobramycin-dexamethasone ophthalmic solution  Commonly known as:  TOBRADEX  Place 1 drop into both eyes daily.     traMADol 50 MG tablet  Commonly known as:  ULTRAM  Take 1 tablet (50 mg total) by mouth every 6 (six) hours as needed for moderate pain.     traZODone 50 MG tablet  Commonly known as:  DESYREL  Take 100 mg by mouth. Every other night at bedtime as needed  for sleep     triamcinolone 55 MCG/ACT nasal inhaler  Commonly known as:  NASACORT  Place 2 sprays into the nose daily.     zolpidem 5 MG tablet  Commonly known as:  AMBIEN  Take 5 mg by mouth. Every other night at bedtime as needed for sleep           Follow-up Information   Follow up with [name], MD. Schedule an appointment as soon as possible for a visit today. (As needed)    Specialty:  Internal Medicine   Contact information:   [address] [place] [address] [phone]       Follow up with Chauncey Cruel, MD. (At your scheduled appt  time)    Specialty:  Oncology   Contact information:   [place] [address] [phone]        The results of significant diagnostics from this hospitalization (including imaging, microbiology, ancillary and laboratory) are listed below for reference.    Significant Diagnostic Studies: Dg Chest 2 View  06/24/2013   CLINICAL DATA:  Fever.  Cough for 1 day.  EXAM: CHEST  2 VIEW  COMPARISON:  DG ABD ACUTE W/CHEST dated 06/23/2013; CT CHEST W/CM dated 03/17/2013; CT CHEST W/CM dated 05/16/2013  FINDINGS: Pulmonary nodules demonstrated on prior CT are difficult to visualize radiographically. Right IJ Port-A-Cath appears unchanged. Chronic bronchitic changes at the bases.  No airspace disease or pleural effusion identified. No interval change. Cardiopericardial silhouette is within normal limits.  IMPRESSION: No interval change or acute abnormality. Pulmonary nodules demonstrated on prior chest CT compatible with metastatic disease are difficult to resolve radiographically.   Electronically Signed   By: Dereck Ligas M.D.   On: 06/24/2013 23:03   Dg Abd Acute W/chest  06/23/2013   CLINICAL DATA:  Umbilical pain. History of breast cancer with pulmonary metastases.  EXAM: ACUTE ABDOMEN SERIES (ABDOMEN 2 VIEW & CHEST 1 VIEW)  COMPARISON:  CT chest 05/16/2013  FINDINGS: Port-A-Cath remains in place. Pulmonary nodules are noted but better demonstrated on CT scan. The lungs are otherwise clear. Heart size is normal. No pneumothorax or pleural fluid.  Two views of the abdomen show no free intraperitoneal air. The bowel gas pattern is normal.  IMPRESSION: No acute finding chest or abdomen. Pulmonary nodules consistent with metastatic breast cancer are noted as seen on prior CT.   Electronically Signed   By: Inge Rise M.D.   On: 06/23/2013 20:49    Labs:  Basic Metabolic Panel:  Recent Labs Lab 06/23/13 1858 06/24/13 1050 06/24/13 2245 06/25/13 0338  NA 133* 135* 135* 136*   K 4.1 3.9 4.1 4.0  CL 94*  --  98 102  CO2 26 25 26 25   GLUCOSE 99 121 100* 104*  BUN 15 12.7 9 8   CREATININE 0.67 0.7 0.76 0.67  CALCIUM 9.0 9.6 9.0 8.0*   GFR Estimated Creatinine Clearance: 64.6 ml/min (by  C-G formula based on Cr of 0.67). Liver Function Tests:  Recent Labs Lab 06/23/13 1858 06/24/13 1050 06/24/13 2245  AST 17 15 22   ALT 21 25 28   ALKPHOS 138* 126 117  BILITOT 0.5 0.58 0.3  PROT 6.8 7.0 6.7  ALBUMIN 3.7 3.8 3.5    Recent Labs Lab 06/23/13 1858  LIPASE 21   CBC:  Recent Labs Lab 06/23/13 1858 06/24/13 1050 06/24/13 2245 06/25/13 0338 06/26/13 1020  WBC 0.4* 0.3* 0.3* 0.6* 5.3  NEUTROABS 0.1* 0.0* 0.0*  --  3.1  HGB 10.6* 10.5* 9.2* 7.0* 9.1*  HCT 31.3* 31.2* 26.6* 20.8* 26.6*  MCV 87.7 88.6 88.1 88.5 88.4  PLT 181 149 158 127* 211   Microbiology Recent Results (from the past 240 hour(s))  URINE CULTURE     Status: None   Collection Time    06/25/13  8:38 AM      Result Value Ref Range Status   Specimen Description URINE, RANDOM   Final   Special Requests NONE   Final   Culture  Setup Time     Final   Value: 06/25/2013 12:47     Performed at [facility]     Final   Value: NO GROWTH     Performed at [facility]   Culture     Final   Value: NO GROWTH     Performed at [facility]   Report Status 06/26/2013 FINAL   Final  RAPID STREP SCREEN     Status: None   Collection Time    06/26/13  6:01 AM      Result Value Ref Range Status   Streptococcus, Group A Screen (Direct) NEGATIVE  NEGATIVE Final   Comment: (NOTE)     A Rapid Antigen test may result negative if the antigen level in the     sample is below the detection level of this test. The FDA has not     cleared this test as a stand-alone test therefore the rapid antigen     negative result has reflexed to a Group A Strep culture.    Time coordinating discharge: 35 minutes.  Signed:  Tinslee Klare  Pager [phone] [facility] 06/26/2013, 11:40 AM

## 2013-06-27 ENCOUNTER — Other Ambulatory Visit (HOSPITAL_BASED_OUTPATIENT_CLINIC_OR_DEPARTMENT_OTHER): Payer: BC Managed Care – PPO

## 2013-06-27 ENCOUNTER — Encounter (INDEPENDENT_AMBULATORY_CARE_PROVIDER_SITE_OTHER): Payer: Self-pay

## 2013-06-27 ENCOUNTER — Ambulatory Visit (HOSPITAL_BASED_OUTPATIENT_CLINIC_OR_DEPARTMENT_OTHER): Payer: BC Managed Care – PPO | Admitting: Hematology and Oncology

## 2013-06-27 ENCOUNTER — Ambulatory Visit (HOSPITAL_BASED_OUTPATIENT_CLINIC_OR_DEPARTMENT_OTHER): Payer: BC Managed Care – PPO

## 2013-06-27 ENCOUNTER — Telehealth: Payer: Self-pay | Admitting: *Deleted

## 2013-06-27 VITALS — BP 102/64 | HR 99 | Temp 98.7°F | Resp 20 | Ht 64.0 in | Wt 127.8 lb

## 2013-06-27 DIAGNOSIS — C50419 Malignant neoplasm of upper-outer quadrant of unspecified female breast: Secondary | ICD-10-CM

## 2013-06-27 DIAGNOSIS — K1231 Oral mucositis (ulcerative) due to antineoplastic therapy: Secondary | ICD-10-CM

## 2013-06-27 DIAGNOSIS — C7952 Secondary malignant neoplasm of bone marrow: Secondary | ICD-10-CM

## 2013-06-27 DIAGNOSIS — C50411 Malignant neoplasm of upper-outer quadrant of right female breast: Secondary | ICD-10-CM

## 2013-06-27 DIAGNOSIS — E860 Dehydration: Secondary | ICD-10-CM

## 2013-06-27 DIAGNOSIS — C7800 Secondary malignant neoplasm of unspecified lung: Secondary | ICD-10-CM

## 2013-06-27 DIAGNOSIS — C787 Secondary malignant neoplasm of liver and intrahepatic bile duct: Secondary | ICD-10-CM

## 2013-06-27 DIAGNOSIS — C7951 Secondary malignant neoplasm of bone: Secondary | ICD-10-CM

## 2013-06-27 DIAGNOSIS — C50919 Malignant neoplasm of unspecified site of unspecified female breast: Secondary | ICD-10-CM

## 2013-06-27 LAB — CBC WITH DIFFERENTIAL/PLATELET
BASO%: 1.5 % (ref 0.0–2.0)
Basophils Absolute: 0.1 10*3/uL (ref 0.0–0.1)
EOS%: 0.1 % (ref 0.0–7.0)
Eosinophils Absolute: 0 10*3/uL (ref 0.0–0.5)
HEMATOCRIT: 25.4 % — AB (ref 34.8–46.6)
HGB: 8.5 g/dL — ABNORMAL LOW (ref 11.6–15.9)
LYMPH#: 1.4 10*3/uL (ref 0.9–3.3)
LYMPH%: 17.1 % (ref 14.0–49.7)
MCH: 29.7 pg (ref 25.1–34.0)
MCHC: 33.5 g/dL (ref 31.5–36.0)
MCV: 88.8 fL (ref 79.5–101.0)
MONO#: 1 10*3/uL — ABNORMAL HIGH (ref 0.1–0.9)
MONO%: 12.3 % (ref 0.0–14.0)
NEUT#: 5.7 10*3/uL (ref 1.5–6.5)
NEUT%: 69 % (ref 38.4–76.8)
Platelets: 193 10*3/uL (ref 145–400)
RBC: 2.86 10*6/uL — ABNORMAL LOW (ref 3.70–5.45)
RDW: 15.2 % — ABNORMAL HIGH (ref 11.2–14.5)
WBC: 8.2 10*3/uL (ref 3.9–10.3)

## 2013-06-27 LAB — CULTURE, GROUP A STREP

## 2013-06-27 MED ORDER — ONDANSETRON 8 MG/NS 50 ML IVPB
INTRAVENOUS | Status: AC
  Filled 2013-06-27: qty 8

## 2013-06-27 MED ORDER — SODIUM CHLORIDE 0.9 % IJ SOLN
10.0000 mL | INTRAMUSCULAR | Status: DC | PRN
  Administered 2013-06-27: 10 mL via INTRAVENOUS
  Filled 2013-06-27: qty 10

## 2013-06-27 MED ORDER — ONDANSETRON 8 MG/50ML IVPB (CHCC)
8.0000 mg | Freq: Once | INTRAVENOUS | Status: AC
  Administered 2013-06-27: 8 mg via INTRAVENOUS

## 2013-06-27 MED ORDER — SODIUM CHLORIDE 0.9 % IV SOLN
1000.0000 mL | Freq: Once | INTRAVENOUS | Status: AC
  Administered 2013-06-27: 1000 mL via INTRAVENOUS

## 2013-06-27 MED ORDER — HEPARIN SOD (PORK) LOCK FLUSH 100 UNIT/ML IV SOLN
500.0000 [IU] | Freq: Once | INTRAVENOUS | Status: AC
  Administered 2013-06-27: 500 [IU] via INTRAVENOUS
  Filled 2013-06-27: qty 5

## 2013-06-27 MED ORDER — ONDANSETRON 8 MG PO TBDP: 8.0000 mg | ORAL_TABLET | Freq: Three times a day (TID) | ORAL | Status: DC | PRN

## 2013-06-27 NOTE — ED Provider Notes (Signed)
Medical screening examination/treatment/procedure(s) were performed by non-physician practitioner and as supervising physician I was immediately available for consultation/collaboration.      [place], DO 06/27/13 [phone]

## 2013-06-27 NOTE — Patient Instructions (Signed)
Dehydration, Adult Dehydration is when you lose more fluids from the body than you take in. Vital organs like the kidneys, brain, and heart cannot function without a proper amount of fluids and salt. Any loss of fluids from the body can cause dehydration.  CAUSES   Vomiting.  Diarrhea.  Excessive sweating.  Excessive urine output.  Fever. SYMPTOMS  Mild dehydration  Thirst.  Dry lips.  Slightly dry mouth. Moderate dehydration  Very dry mouth.  Sunken eyes.  Skin does not bounce back quickly when lightly pinched and released.  Dark urine and decreased urine production.  Decreased tear production.  Headache. Severe dehydration  Very dry mouth.  Extreme thirst.  Rapid, weak pulse (more than 100 beats per minute at rest).  Cold hands and feet.  Not able to sweat in spite of heat and temperature.  Rapid breathing.  Blue lips.  Confusion and lethargy.  Difficulty being awakened.  Minimal urine production.  No tears. DIAGNOSIS  Your caregiver will diagnose dehydration based on your symptoms and your exam. Blood and urine tests will help confirm the diagnosis. The diagnostic evaluation should also identify the cause of dehydration. TREATMENT  Treatment of mild or moderate dehydration can often be done at home by increasing the amount of fluids that you drink. It is best to drink small amounts of fluid more often. Drinking too much at one time can make vomiting worse. Refer to the home care instructions below. Severe dehydration needs to be treated at the hospital where you will probably be given intravenous (IV) fluids that contain water and electrolytes. HOME CARE INSTRUCTIONS   Ask your caregiver about specific rehydration instructions.  Drink enough fluids to keep your urine clear or pale yellow.  Drink small amounts frequently if you have nausea and vomiting.  Eat as you normally do.  Avoid:  Foods or drinks high in sugar.  Carbonated  drinks.  Juice.  Extremely hot or cold fluids.  Drinks with caffeine.  Fatty, greasy foods.  Alcohol.  Tobacco.  Overeating.  Gelatin desserts.  Wash your hands well to avoid spreading bacteria and viruses.  Only take over-the-counter or prescription medicines for pain, discomfort, or fever as directed by your caregiver.  Ask your caregiver if you should continue all prescribed and over-the-counter medicines.  Keep all follow-up appointments with your caregiver. SEEK MEDICAL CARE IF:  You have abdominal pain and it increases or stays in one area (localizes).  You have a rash, stiff neck, or severe headache.  You are irritable, sleepy, or difficult to awaken.  You are weak, dizzy, or extremely thirsty. SEEK IMMEDIATE MEDICAL CARE IF:   You are unable to keep fluids down or you get worse despite treatment.  You have frequent episodes of vomiting or diarrhea.  You have blood or green matter (bile) in your vomit.  You have blood in your stool or your stool looks black and tarry.  You have not urinated in 6 to 8 hours, or you have only urinated a small amount of very dark urine.  You have a fever.  You faint. MAKE SURE YOU:   Understand these instructions.  Will watch your condition.  Will get help right away if you are not doing well or get worse. Document Released: 05/01/2005 Document Revised: 07/24/2011 Document Reviewed: 12/19/2010 Exit[employer] Patient Information 2014 [employer].  

## 2013-06-27 NOTE — Progress Notes (Signed)
To the Patient ID: Carrie Berry, female   DOB: 1951-01-16, 63 y.o.   MRN: [id number] ID: Carrie Berry OB: 1950/05/27  MR#: [id number]  CSN#:[id number]  PCP: [name], MD GYN:   SU: [name] OTHER MD: Thea Silversmith, Earle Gell, Christene Slates, Erline Levine  DIAGNOSIS: Metastatic breast cancer   CURRENT THERAPY: Status post cycle 3 chemotherapy with dose dense AC    CHIEF COMPLAINT: Nausea and poor po intake  HISTORY OF PRESENT ILLNESS: Carrie Berry noted a mass in her right breast December of 2013, but did not think much of . It did grow some lower the summer, but she was keeping her grandchild at that time and was too busy so she did not bring it to Dr. Bernell List attention until August. He set her up for bilateral mammography and ultrasonography at [facility] 12/18/2012 and this measured, on the right, a large irregular lobulated mass in the upper outer quadrant which by ultrasound measured 5.7 cm. The right axilla showed some lymph nodes with thickened cortices. In the left breast there was a focal asymmetry at the depth, lateral to the nipple, and by ultrasound there was a 9 mm ill-defined hypoechoic lesion in this location. Biopsy of the left breast lesion showed only fibrocystic changes.  Biopsy of the right breast mass and right axillary adenopathy ([id number]) on 12/23/2012 showed both to be involved by invasive ductal carcinoma, grade 2, triple negative, with an MIB-1 of 86%.  MRI of the breast 12/27/2012 at [facility] imaging showed in the right breast a mass abutting the pectoralis muscle without enhancement of the muscle measuring 4.8 cm. The satellite nodule measuring approximately 3 mm was also noted superior to the mass and several abnormal and enlarged right axillary lymph nodes were noted, one of which appeared to be necrotic. The largest node measured 2.0 cm. Unfortunately, numerous bilateral pulmonary nodules were also noted.  The patient's subsequent history is as detailed  below   INTERVAL HISTORY: Carrie Berry is here for followup  Visit with her sister today. She was discharged yesterday from the hospital. She  was admitted to the hospital with fever, nausea, dehydration and severe fatigue. She was started on antibiotics,  IV fluids and she was stabilized and was discharged home. She says that because of taste changes and mouth sores she is not able to eat good. She still has the persistent nausea. Denies any constipation, fever, shortness of breath, chest pain, palpitations, blood in the stool blood in the urine, blurred vision, headaches, dizziness   REVIEW OF SYSTEMS:   All 14 review of systems is been assessed and the pertinent symptoms are listed in interval history    PAST MEDICAL HISTORY: Past Medical History  Diagnosis Date  . Recurrent sinus infections   . Arthritis   . PONV (postoperative nausea and vomiting)   . Anxiety   . Cancer     breast  . Breast cancer   . GERD (gastroesophageal reflux disease)   . 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 Tubal ligation    . Tubal ligation  1985  . Portacath placement N/A 01/10/2013    Procedure: ULTRASOUND GUIDED PORT-A-CATH INSERTION WITH FLUOROSCOPY;  Surgeon: [name], MD;  Location: [facility];  Service: General;  Laterality: N/A;    FAMILY HISTORY Family History  Problem Relation Age of Onset  . Bladder Cancer Father   . Colon cancer Maternal Grandmother  the patient's parents are living, both in their 36s. The patient's father was diagnosed with bladder cancer the age of 74. The patient's mother's mother had some type of gastrointestinal cancer. The patient had one brother, no sisters. There is no history of breast or ovarian cancer in the family other than a cousin on the father's side who was diagnosed with breast cancer apparently before the age of 61.  GYNECOLOGIC HISTORY:  Menarche  age 83, first live birth age 51, the patient is Carrie Berry P2. She went through menopause in 2008. She did not take hormone replacement. She took birth control for approximately 22 years remotely.  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 She's currently on disability. Her husband Carrie Berry"), is vice Development worker, international aid for [employer]. The patient's daughter Carrie Berry is a stay-at-home mom in [place], the patient's son Carrie Berry unfortunately died in an automobile accident in December of 2011. The patient has 2 grandchildren. She attends to a local [employer].    ADVANCED DIRECTIVES: Not in place   HEALTH MAINTENANCE: (Updated December 2014) History  Substance Use Topics  . Smoking status: Never Smoker   . Smokeless tobacco: Never Used  . Alcohol Use: No     Colonoscopy: 2005  PAP: Not on file  Bone density: Never  Lipid panel:  Dr. Delfina Redwood   Allergies  Allergen Reactions  . Ondansetron Hcl Other (See Comments)    HEADACHE, patient states she still takes   . Codeine Nausea And Vomiting    "violently ill"  . Penicillins Rash    Childhood reaction -    Current Outpatient Prescriptions  Medication Sig Dispense Refill  . acetaminophen (TYLENOL) 325 MG tablet Take 650 mg by mouth every 8 (eight) hours as needed for pain.      Marland Kitchen aspirin-acetaminophen-caffeine (EXCEDRIN MIGRAINE) 250-250-65 MG per tablet Take 1 tablet by mouth every 6 (six) hours as needed for headache.      . ciprofloxacin (CIPRO) 500 MG tablet Take 1 tablet (500 mg total) by mouth 2 (two) times daily.      Marland Kitchen dexamethasone (DECADRON) 4 MG tablet Take 2 tablets by mouth once a day on the day after chemotherapy and then take 2 tablets two times a day for 2 days. Take with food.  30 tablet  1  . lidocaine-prilocaine (EMLA) cream Apply topically as needed.  30 g  3  . omeprazole (PRILOSEC)  20 MG capsule Take 1 capsule (20 mg total) by mouth 2 (two) times daily.  60 capsule  3  . ondansetron (ZOFRAN ODT) 4 MG disintegrating tablet 2mg  ODT q4 hours prn vomiting  4 tablet  0  . ondansetron (ZOFRAN) 8 MG tablet Take 8 mg by mouth every 8 (eight) hours as needed for nausea or vomiting.      Marland Kitchen PATADAY 0.2 % SOLN Place 1 drop into both eyes daily as needed (dry eyes).       . prochlorperazine (COMPAZINE) 10 MG tablet Take 1 tablet (10 mg total) by mouth every 6 (six) hours as needed for nausea.  30 tablet  2  . tobramycin-dexamethasone (TOBRADEX) ophthalmic solution Place 1 drop into both eyes daily.  5 mL  1  . traMADol (ULTRAM) 50 MG tablet Take 1 tablet (50 mg total) by mouth every 6 (six) hours as needed for moderate pain.  60 tablet  0  . traZODone (DESYREL) 50  MG tablet Take 100 mg by mouth. Every other night at bedtime as needed  for sleep      . triamcinolone (NASACORT) 55 MCG/ACT nasal inhaler Place 2 sprays into the nose daily.      Marland Kitchen zolpidem (AMBIEN) 5 MG tablet Take 5 mg by mouth. Every other night at bedtime as needed for sleep       No current facility-administered medications for this visit.    OBJECTIVE: Middle-aged white woman in no acute distress Her blood pressures is 102/62 mmHg, pulse is 101 per minute, respiratory 20 per minute and temperature 98.29F  Physical Exam: HEENT:  Sclerae anicteric., Conjunctiva no pallor, Oropharynx dry mucous membranes, positive oral mucositis is noted and no evidence of oropharyngeal candidiasis.  Nasal mucosa is mildly erythematous and edematous bilaterally. Neck is supple.  NODES:  No cervical or supraclavicular lymphadenopathy palpated.  BREAST EXAM: Decrease in the size of the  right breast mass was noted with no tenderness. The palpable nodules in the lateral portion of the breast do appear somewhat softer with palpation. There is a palpable nodule in the right axilla.  LUNGS:  Clear to auscultation bilaterally.  No wheezes or  rhonchi HEART:  Regular rate and rhythm. No murmur appreciated ABDOMEN:  Soft, nontender. No hepatomegaly palpated.  Positive bowel sounds.  MSK:  No focal spinal tenderness to palpation. Good range of motion bilaterally in the upper extremities. EXTREMITIES:  No peripheral edema.   SKIN:  Benign with no visible rashes or skin lesions. Port is intact in the chest wall with no erythema or edema, and no evidence of infection/cellulitis. NEURO:  Nonfocal. Well oriented.  Appropriate affect.   LAB RESULTS:   Lab Results  Component Value Date   WBC 5.3 06/26/2013   NEUTROABS 3.1 06/26/2013   HGB 9.1* 06/26/2013   HCT 26.6* 06/26/2013   MCV 88.4 06/26/2013   PLT 211 06/26/2013      Chemistry      Component Value Date/Time   NA 136* 06/25/2013 0338   NA 135* 06/24/2013 1050   K 4.0 06/25/2013 0338   K 3.9 06/24/2013 1050   CL 102 06/25/2013 0338   CO2 25 06/25/2013 0338   CO2 25 06/24/2013 1050   BUN 8 06/25/2013 0338   BUN 12.7 06/24/2013 1050   CREATININE 0.67 06/25/2013 0338   CREATININE 0.7 06/24/2013 1050      Component Value Date/Time   CALCIUM 8.0* 06/25/2013 0338   CALCIUM 9.6 06/24/2013 1050   ALKPHOS 117 06/24/2013 2245   ALKPHOS 126 06/24/2013 1050   AST 22 06/24/2013 2245   AST 15 06/24/2013 1050   ALT 28 06/24/2013 2245   ALT 25 06/24/2013 1050   BILITOT 0.3 06/24/2013 2245   BILITOT 0.58 06/24/2013 1050        STUDIES:  Ct Chest W Contrast  05/16/2013   CLINICAL DATA:  Followup of breast cancer. Diagnosed 8/13. Chemotherapy ongoing.  EXAM: CT CHEST WITH CONTRAST  TECHNIQUE: Multidetector CT imaging of the chest was performed during intravenous contrast administration.  CONTRAST:  39mL OMNIPAQUE IOHEXOL 300 MG/ML  SOLN  COMPARISON:  03/17/2013  FINDINGS: Lungs/Pleura: Mild progression of pulmonary metastasis. Index right lower lobe nodule measures 6 mm on image 29 today versus 5 mm on the prior.  Posterior left upper lobe 8 mm nodule on image 20/series 5 is increased from 6 mm on  the prior.  Index right lower lobe nodule measures 12 mm on image 25 versus 9 mm on  the prior.  No pleural fluid.  Heart/Mediastinum: A right-sided Port-A-Cath which terminates at the mid SVC.  Right axillary node which measures 1.5 cm on image 23 versus 1.6 cm on the prior.  Right breast mass which measures 5.6 x 7.8 cm on image 38 and has enlarged from 4.7 x 4.7 cm on the prior.  No left axillary adenopathy. Normal heart size, without pericardial effusion. No mediastinal or hilar adenopathy.  Upper Abdomen: Progressive hepatic metastasis. Index posterior segment right hepatic lobe 2.1 cm lesion on image 59 measured 1.7 cm on the prior.  A medial segment left liver lobe 1.7 cm lesion on image 64 measured 1.4 cm on the prior.  Normal adrenal glands.  Bones/Musculoskeletal:  No acute osseous abnormality.  IMPRESSION: 1. Progressive metastatic disease, including within the liver and lungs. 2. Primary breast mass is also enlarged since the prior. 3. Right axillary adenopathy is similar to minimally improved.   Electronically Signed   By: Abigail Miyamoto M.D.   On: 05/16/2013 09:21     Transthoracic Echocardiography  Patient: Montanna, Mcbain MR #: [id number] Study Date: 05/02/2013 Gender: F Age: 63 Height: 162.6cm Weight: 60.8kg BSA: 1.57m^2 Pt. Status: Room:  PERFORMING [place], [place] Ravens, Amy SONOGRAPHER Mauricio Po, RDCS, CCT cc:  ------------------------------------------------------------ LV EF: 55% - 60%  ------------------------------------------------------------ History: PMH: Breast Cancer   ASSESSMENT/PLAN:  63 y.o. [name], [place] woman status post right breast upper outer quadrant and right axillary lymph node biopsy 12/23/2012 for a clinical T3 N1 M1, stage IV invasive ductal carcinoma, grade 3, triple negative, with an MIB-1 of 86%.  (1) right liver lobe biopsy 01/21/2013 confirms metastatic adenocarcinoma; scans show involvement of the  liver, lungs, and likely bone.  (2) enrolled in [facility] study [id number] (docetaxel + oral gamma secretase inibitor [id number]), received one cycle starting 02/04/2013  but withdrew because of poor tolerance despite treatment interruptions and decreased dosing of the oral component  (3)  chest CT in early November 2014 was compared with studies in August 2014 and showed interval progression of metastatic breast cancer, with an enlarging primary right breast mass, enlarging right axillary lymph nodes, enlarging pulmonary nodules and new/enlarging hepatic metastases.  (4) Abraxane started 03/19/2013, given day 1 and day 8 of each 21 day cycle, stopped with the day 1 cycle 3 dose (04/29/2013) because of local progression of disease  (5) cyclophosphamide and doxorubicin started 05/20/2013, given in dose dense fashion  with Neulasta support on day #2.  The plan is to complete 4 cycles, to be followed by definitive local treatment.  (6) The dose of the cyclophosphamide has been decreased to 500 mg/m2 and doxorubicin decrease it to 50 mg per square meter with the next chemotherapy in view of severe side effects  (7) neutropenic fever -resolved: Continue antibiotics as scheduled  (8) nausea and vomiting secondary to chemotherapy induced: Will arrange for  IV Zofran 8mg  for nausea and vomiting today. Continue nausea vomiting medication as scheduled on  discharge  (9) poor po intake secondary to oral mucositis/nausea and vomiting: Will arrange for 1049ml of normal saline to be given today. I've asked the patient to use biotin mouthwash every 4 hours.   (10) fatigue secondary to chemotherapy  Followup on 07/01/2013 with CBC differential, CMP, office visit and for fourth cycle of dose dense AC chemotherapy   All questions were answered. The patient knows to call the clinic with any problems, questions or concerns. We can certainly see the patient much  sooner if necessary.  I spent 20 minutes counseling the  patient face to face. The total time spent in the appointment was 30 minutes.   Wilmon Arms, MD  Medical Oncology   06/27/2013 10:51 AM

## 2013-06-27 NOTE — Telephone Encounter (Signed)
Per staff message and POF I have scheduled appts.  JMW  

## 2013-07-01 ENCOUNTER — Ambulatory Visit (HOSPITAL_BASED_OUTPATIENT_CLINIC_OR_DEPARTMENT_OTHER): Payer: BC Managed Care – PPO

## 2013-07-01 ENCOUNTER — Other Ambulatory Visit: Payer: Self-pay | Admitting: *Deleted

## 2013-07-01 ENCOUNTER — Other Ambulatory Visit (HOSPITAL_BASED_OUTPATIENT_CLINIC_OR_DEPARTMENT_OTHER): Payer: BC Managed Care – PPO

## 2013-07-01 ENCOUNTER — Ambulatory Visit (HOSPITAL_BASED_OUTPATIENT_CLINIC_OR_DEPARTMENT_OTHER): Payer: BC Managed Care – PPO | Admitting: Hematology and Oncology

## 2013-07-01 VITALS — BP 104/63 | HR 109 | Temp 98.7°F | Resp 18 | Ht 64.0 in | Wt 124.9 lb

## 2013-07-01 DIAGNOSIS — C50411 Malignant neoplasm of upper-outer quadrant of right female breast: Secondary | ICD-10-CM

## 2013-07-01 DIAGNOSIS — C50919 Malignant neoplasm of unspecified site of unspecified female breast: Secondary | ICD-10-CM

## 2013-07-01 DIAGNOSIS — E860 Dehydration: Secondary | ICD-10-CM

## 2013-07-01 DIAGNOSIS — K1231 Oral mucositis (ulcerative) due to antineoplastic therapy: Secondary | ICD-10-CM

## 2013-07-01 DIAGNOSIS — C773 Secondary and unspecified malignant neoplasm of axilla and upper limb lymph nodes: Secondary | ICD-10-CM

## 2013-07-01 DIAGNOSIS — C787 Secondary malignant neoplasm of liver and intrahepatic bile duct: Secondary | ICD-10-CM

## 2013-07-01 DIAGNOSIS — C50419 Malignant neoplasm of upper-outer quadrant of unspecified female breast: Secondary | ICD-10-CM

## 2013-07-01 DIAGNOSIS — R112 Nausea with vomiting, unspecified: Secondary | ICD-10-CM

## 2013-07-01 LAB — CBC WITH DIFFERENTIAL/PLATELET
BASO%: 0.6 % (ref 0.0–2.0)
Basophils Absolute: 0.1 10*3/uL (ref 0.0–0.1)
EOS%: 0.1 % (ref 0.0–7.0)
Eosinophils Absolute: 0 10*3/uL (ref 0.0–0.5)
HEMATOCRIT: 30.6 % — AB (ref 34.8–46.6)
HEMOGLOBIN: 9.9 g/dL — AB (ref 11.6–15.9)
LYMPH#: 0.9 10*3/uL (ref 0.9–3.3)
LYMPH%: 7.2 % — ABNORMAL LOW (ref 14.0–49.7)
MCH: 30 pg (ref 25.1–34.0)
MCHC: 32.4 g/dL (ref 31.5–36.0)
MCV: 92.7 fL (ref 79.5–101.0)
MONO#: 1.3 10*3/uL — AB (ref 0.1–0.9)
MONO%: 10.3 % (ref 0.0–14.0)
NEUT#: 10 10*3/uL — ABNORMAL HIGH (ref 1.5–6.5)
NEUT%: 81.8 % — AB (ref 38.4–76.8)
Platelets: 258 10*3/uL (ref 145–400)
RBC: 3.3 10*6/uL — ABNORMAL LOW (ref 3.70–5.45)
RDW: 17.6 % — AB (ref 11.2–14.5)
WBC: 12.2 10*3/uL — AB (ref 3.9–10.3)

## 2013-07-01 LAB — BASIC METABOLIC PANEL
BUN: 11 mg/dL (ref 6–23)
CHLORIDE: 98 meq/L (ref 96–112)
CO2: 27 meq/L (ref 19–32)
CREATININE: 0.65 mg/dL (ref 0.50–1.10)
Calcium: 8.8 mg/dL (ref 8.4–10.5)
Glucose, Bld: 106 mg/dL — ABNORMAL HIGH (ref 70–99)
POTASSIUM: 4.1 meq/L (ref 3.5–5.3)
Sodium: 135 mEq/L (ref 135–145)

## 2013-07-01 MED ORDER — SODIUM CHLORIDE 0.9 % IJ SOLN
10.0000 mL | INTRAMUSCULAR | Status: DC | PRN
  Administered 2013-07-01: 10 mL via INTRAVENOUS
  Filled 2013-07-01: qty 10

## 2013-07-01 MED ORDER — LORAZEPAM 0.5 MG PO TABS: 0.5000 mg | ORAL_TABLET | Freq: Three times a day (TID) | ORAL | Status: DC | PRN

## 2013-07-01 MED ORDER — SODIUM CHLORIDE 0.9 % IV SOLN
INTRAVENOUS | Status: DC
  Administered 2013-07-01: 12:00:00 via INTRAVENOUS

## 2013-07-01 MED ORDER — ONDANSETRON 8 MG/50ML IVPB (CHCC)
8.0000 mg | Freq: Once | INTRAVENOUS | Status: AC
  Administered 2013-07-01: 8 mg via INTRAVENOUS

## 2013-07-01 MED ORDER — HEPARIN SOD (PORK) LOCK FLUSH 100 UNIT/ML IV SOLN
500.0000 [IU] | Freq: Once | INTRAVENOUS | Status: AC
  Administered 2013-07-01: 500 [IU] via INTRAVENOUS
  Filled 2013-07-01: qty 5

## 2013-07-01 MED ORDER — ONDANSETRON 8 MG/NS 50 ML IVPB
INTRAVENOUS | Status: AC
  Filled 2013-07-01: qty 8

## 2013-07-01 NOTE — Patient Instructions (Signed)
Dehydration, Adult Dehydration is when you lose more fluids from the body than you take in. Vital organs like the kidneys, brain, and heart cannot function without a proper amount of fluids and salt. Any loss of fluids from the body can cause dehydration.  CAUSES   Vomiting.  Diarrhea.  Excessive sweating.  Excessive urine output.  Fever. SYMPTOMS  Mild dehydration  Thirst.  Dry lips.  Slightly dry mouth. Moderate dehydration  Very dry mouth.  Sunken eyes.  Skin does not bounce back quickly when lightly pinched and released.  Dark urine and decreased urine production.  Decreased tear production.  Headache. Severe dehydration  Very dry mouth.  Extreme thirst.  Rapid, weak pulse (more than 100 beats per minute at rest).  Cold hands and feet.  Not able to sweat in spite of heat and temperature.  Rapid breathing.  Blue lips.  Confusion and lethargy.  Difficulty being awakened.  Minimal urine production.  No tears. DIAGNOSIS  Your caregiver will diagnose dehydration based on your symptoms and your exam. Blood and urine tests will help confirm the diagnosis. The diagnostic evaluation should also identify the cause of dehydration. TREATMENT  Treatment of mild or moderate dehydration can often be done at home by increasing the amount of fluids that you drink. It is best to drink small amounts of fluid more often. Drinking too much at one time can make vomiting worse. Refer to the home care instructions below. Severe dehydration needs to be treated at the hospital where you will probably be given intravenous (IV) fluids that contain water and electrolytes. HOME CARE INSTRUCTIONS   Ask your caregiver about specific rehydration instructions.  Drink enough fluids to keep your urine clear or pale yellow.  Drink small amounts frequently if you have nausea and vomiting.  Eat as you normally do.  Avoid:  Foods or drinks high in sugar.  Carbonated  drinks.  Juice.  Extremely hot or cold fluids.  Drinks with caffeine.  Fatty, greasy foods.  Alcohol.  Tobacco.  Overeating.  Gelatin desserts.  Wash your hands well to avoid spreading bacteria and viruses.  Only take over-the-counter or prescription medicines for pain, discomfort, or fever as directed by your caregiver.  Ask your caregiver if you should continue all prescribed and over-the-counter medicines.  Keep all follow-up appointments with your caregiver. SEEK MEDICAL CARE IF:  You have abdominal pain and it increases or stays in one area (localizes).  You have a rash, stiff neck, or severe headache.  You are irritable, sleepy, or difficult to awaken.  You are weak, dizzy, or extremely thirsty. SEEK IMMEDIATE MEDICAL CARE IF:   You are unable to keep fluids down or you get worse despite treatment.  You have frequent episodes of vomiting or diarrhea.  You have blood or green matter (bile) in your vomit.  You have blood in your stool or your stool looks black and tarry.  You have not urinated in 6 to 8 hours, or you have only urinated a small amount of very dark urine.  You have a fever.  You faint. MAKE SURE YOU:   Understand these instructions.  Will watch your condition.  Will get help right away if you are not doing well or get worse. Document Released: 05/01/2005 Document Revised: 07/24/2011 Document Reviewed: 12/19/2010 Exit[employer] Patient Information 2014 [employer].  

## 2013-07-01 NOTE — Progress Notes (Signed)
To the Patient ID: Carrie Berry, female   DOB: 1950/09/12, 63 y.o.   MRN: [id number] ID: Carrie Berry OB: [birth year]  MR#: [id number]  CSN#:[id number]  PCP: [name], MD GYN:   SU: [name] OTHER MD: Thea Silversmith, Earle Gell, Christene Slates, Erline Levine  DIAGNOSIS: Metastatic breast cancer   CURRENT THERAPY: Status post cycle 3 chemotherapy with dose dense AC    CHIEF COMPLAINT: Persistent Nausea and not feeling well   HISTORY OF PRESENT ILLNESS: Carrie Berry noted a mass in her right breast December of 2013, but did not think much of . It did grow some lower the summer, but she was keeping her grandchild at that time and was too busy so she did not bring it to Dr. Bernell List attention until August. He set her up for bilateral mammography and ultrasonography at [facility] 12/18/2012 and this measured, on the right, a large irregular lobulated mass in the upper outer quadrant which by ultrasound measured 5.7 cm. The right axilla showed some lymph nodes with thickened cortices. In the left breast there was a focal asymmetry at the depth, lateral to the nipple, and by ultrasound there was a 9 mm ill-defined hypoechoic lesion in this location. Biopsy of the left breast lesion showed only fibrocystic changes.  Biopsy of the right breast mass and right axillary adenopathy ([id number]) on 12/23/2012 showed both to be involved by invasive ductal carcinoma, grade 2, triple negative, with an MIB-1 of 86%.  MRI of the breast 12/27/2012 at [facility] imaging showed in the right breast a mass abutting the pectoralis muscle without enhancement of the muscle measuring 4.8 cm. The satellite nodule measuring approximately 3 mm was also noted superior to the mass and several abnormal and enlarged right axillary lymph nodes were noted, one of which appeared to be necrotic. The largest node measured 2.0 cm. Unfortunately, numerous bilateral pulmonary nodules were also noted.  The patient's subsequent history  is as detailed below   INTERVAL HISTORY: Carrie Berry is here for followup  Visit with her husband today. Since the time of last visit she developed fever and admitted to the hospital.  She was started on antibiotic and was established and was discharged home. Her urine cultures and  throat cultures for strep were negative. She just finished her last day of Cipro antibiotic. Her fever is resolved she does feel very tired and complains of nasal congestion and runny nose. She continued to have persistent nausea.   She says that because of taste changes, she is not able to eat good and take enough fluids. Denies any constipation,shortness of breath, chest pain, palpitations, blood in the stool blood in the urine, blurred vision, headaches, dizziness   REVIEW OF SYSTEMS:   All 14 review of systems is been assessed and the pertinent symptoms are listed in interval history    PAST MEDICAL HISTORY: Past Medical History  Diagnosis Date  . Recurrent sinus infections   . Arthritis   . PONV (postoperative nausea and vomiting)   . Anxiety   . Cancer     breast  . Breast cancer   . GERD (gastroesophageal reflux disease)   . 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 Tubal ligation    . Tubal ligation  1985  . Portacath placement N/A 01/10/2013    Procedure: ULTRASOUND GUIDED PORT-A-CATH INSERTION WITH FLUOROSCOPY;  Surgeon: [name], MD;  Location: [facility];  Service:  General;  Laterality: N/A;    FAMILY HISTORY Family History  Problem Relation Age of Onset  . Bladder Cancer Father   . Colon cancer Maternal Grandmother    the patient's parents are living, both in their 21s. The patient's father was diagnosed with bladder cancer the age of 24. The patient's mother's mother had some type of gastrointestinal cancer. The patient had one brother, no sisters. There is no history of breast or ovarian  cancer in the family other than a cousin on the father's side who was diagnosed with breast cancer apparently before the age of 43.  GYNECOLOGIC HISTORY:  Menarche age 34, first live birth age 14, the patient is [place] P2. She went through menopause in 2008. She did not take hormone replacement. She took birth control for approximately 22 years remotely.  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 She's currently on disability. Her husband Carrie Berry Carrie Berry"), is vice Development worker, international aid for [employer]. The patient's daughter Carrie Berry is a stay-at-home mom in [place], the patient's son Carrie Berry unfortunately died in an automobile accident in December of 2011. The patient has 2 grandchildren. She attends to a local [employer].    ADVANCED DIRECTIVES: Not in place   HEALTH MAINTENANCE: (Updated December 2014) History  Substance Use Topics  . Smoking status: Never Smoker   . Smokeless tobacco: Never Used  . Alcohol Use: No     Colonoscopy: 2005  PAP: Not on file  Bone density: Never  Lipid panel:  Dr. Delfina Berry   Allergies  Allergen Reactions  . Ondansetron Hcl Other (See Comments)    HEADACHE, patient states she still takes   . Codeine Nausea And Vomiting    "violently ill"  . Penicillins Rash    Childhood reaction -    Current Outpatient Prescriptions  Medication Sig Dispense Refill  . acetaminophen (TYLENOL) 325 MG tablet Take 650 mg by mouth every 8 (eight) hours as needed for pain.      Marland Kitchen aspirin-acetaminophen-caffeine (EXCEDRIN MIGRAINE) 250-250-65 MG per tablet Take 1 tablet by mouth every 6 (six) hours as needed for headache.      . lidocaine-prilocaine (EMLA) cream Apply topically as needed.  30 g  3  . omeprazole (PRILOSEC) 20 MG capsule Take 1 capsule (20 mg total) by mouth 2 (two) times daily.  60 capsule  3  . ondansetron (ZOFRAN ODT) 4 MG  disintegrating tablet 2mg  ODT q4 hours prn vomiting  4 tablet  0  . PATADAY 0.2 % SOLN Place 1 drop into both eyes daily as needed (dry eyes).       . prochlorperazine (COMPAZINE) 10 MG tablet Take 1 tablet (10 mg total) by mouth every 6 (six) hours as needed for nausea.  30 tablet  2  . tobramycin-dexamethasone (TOBRADEX) ophthalmic solution Place 1 drop into both eyes daily.  5 mL  1  . traMADol (ULTRAM) 50 MG tablet Take 1 tablet (50 mg total) by mouth every 6 (six) hours as needed for moderate pain.  60 tablet  0  . traZODone (DESYREL) 50 MG tablet Take 100 mg by mouth. Every other night at bedtime as needed  for sleep      . triamcinolone (NASACORT) 55 MCG/ACT nasal inhaler Place 2 sprays into the nose daily.      Marland Kitchen zolpidem (AMBIEN) 5 MG tablet Take 5 mg by mouth. Every other night at bedtime  as needed for sleep      . dexamethasone (DECADRON) 4 MG tablet Take 2 tablets by mouth once a day on the day after chemotherapy and then take 2 tablets two times a day for 2 days. Take with food.  30 tablet  1  . LORazepam (ATIVAN) 0.5 MG tablet Take 1 tablet (0.5 mg total) by mouth every 8 (eight) hours as needed for anxiety.  30 tablet  0  . ondansetron (ZOFRAN ODT) 8 MG disintegrating tablet Take 1 tablet (8 mg total) by mouth every 8 (eight) hours as needed for nausea or vomiting.  20 tablet  0  . ondansetron (ZOFRAN) 8 MG tablet Take 8 mg by mouth every 8 (eight) hours as needed for nausea or vomiting.      . promethazine (PHENERGAN) 25 MG tablet        Current Facility-Administered Medications  Medication Dose Route Frequency Provider Last Rate Last Dose  . 0.9 %  sodium chloride infusion   Intravenous Continuous Wilmon Arms, MD 150 mL/hr at 07/01/13 1210      OBJECTIVE: Middle-aged white woman in no acute distress Her blood pressures is 102/62 mmHg, pulse is 101 per minute, respiratory 20 per minute and temperature 98.19F  Physical Exam: HEENT:  Sclerae anicteric., Conjunctiva no  pallor, Oropharynx dry mucous membranes, positive oral mucositis is noted and no evidence of oropharyngeal candidiasis.  Nasal mucosa is mildly erythematous and edematous bilaterally. Neck is supple.  NODES:  No cervical or supraclavicular lymphadenopathy palpated.  BREAST EXAM: Decrease in the size of the  right breast mass was noted with no tenderness. The palpable nodules in the lateral portion of the breast do appear somewhat softer with palpation. There is a palpable nodule in the right axilla.  LUNGS:  Clear to auscultation bilaterally.  No wheezes or rhonchi HEART:  Regular rate and rhythm. No murmur appreciated ABDOMEN:  Soft, nontender. No hepatomegaly palpated.  Positive bowel sounds.  MSK:  No focal spinal tenderness to palpation. Good range of motion bilaterally in the upper extremities. EXTREMITIES:  No peripheral edema.   SKIN:  Benign with no visible rashes or skin lesions. Port is intact in the chest wall with no erythema or edema, and no evidence of infection/cellulitis. NEURO:  Nonfocal. Well oriented.  Appropriate affect.   LAB RESULTS:   Lab Results  Component Value Date   WBC 12.2* 07/01/2013   NEUTROABS 10.0* 07/01/2013   HGB 9.9* 07/01/2013   HCT 30.6* 07/01/2013   MCV 92.7 07/01/2013   PLT 258 07/01/2013      Chemistry      Component Value Date/Time   NA 136* 06/25/2013 0338   NA 135* 06/24/2013 1050   K 4.0 06/25/2013 0338   K 3.9 06/24/2013 1050   CL 102 06/25/2013 0338   CO2 25 06/25/2013 0338   CO2 25 06/24/2013 1050   BUN 8 06/25/2013 0338   BUN 12.7 06/24/2013 1050   CREATININE 0.67 06/25/2013 0338   CREATININE 0.7 06/24/2013 1050      Component Value Date/Time   CALCIUM 8.0* 06/25/2013 0338   CALCIUM 9.6 06/24/2013 1050   ALKPHOS 117 06/24/2013 2245   ALKPHOS 126 06/24/2013 1050   AST 22 06/24/2013 2245   AST 15 06/24/2013 1050   ALT 28 06/24/2013 2245   ALT 25 06/24/2013 1050   BILITOT 0.3 06/24/2013 2245   BILITOT 0.58 06/24/2013 1050         STUDIES:  Ct Chest W Contrast  05/16/2013  CLINICAL DATA:  Followup of breast cancer. Diagnosed 8/13. Chemotherapy ongoing.  EXAM: CT CHEST WITH CONTRAST  TECHNIQUE: Multidetector CT imaging of the chest was performed during intravenous contrast administration.  CONTRAST:  38mL OMNIPAQUE IOHEXOL 300 MG/ML  SOLN  COMPARISON:  03/17/2013  FINDINGS: Lungs/Pleura: Mild progression of pulmonary metastasis. Index right lower lobe nodule measures 6 mm on image 29 today versus 5 mm on the prior.  Posterior left upper lobe 8 mm nodule on image 20/series 5 is increased from 6 mm on the prior.  Index right lower lobe nodule measures 12 mm on image 25 versus 9 mm on the prior.  No pleural fluid.  Heart/Mediastinum: A right-sided Port-A-Cath which terminates at the mid SVC.  Right axillary node which measures 1.5 cm on image 23 versus 1.6 cm on the prior.  Right breast mass which measures 5.6 x 7.8 cm on image 38 and has enlarged from 4.7 x 4.7 cm on the prior.  No left axillary adenopathy. Normal heart size, without pericardial effusion. No mediastinal or hilar adenopathy.  Upper Abdomen: Progressive hepatic metastasis. Index posterior segment right hepatic lobe 2.1 cm lesion on image 59 measured 1.7 cm on the prior.  A medial segment left liver lobe 1.7 cm lesion on image 64 measured 1.4 cm on the prior.  Normal adrenal glands.  Bones/Musculoskeletal:  No acute osseous abnormality.  IMPRESSION: 1. Progressive metastatic disease, including within the liver and lungs. 2. Primary breast mass is also enlarged since the prior. 3. Right axillary adenopathy is similar to minimally improved.   Electronically Signed   By: Abigail Miyamoto M.D.   On: 05/16/2013 09:21     Transthoracic Echocardiography  Patient: Trini, Soldo MR #: [id number] Study Date: 05/02/2013 Gender: F Age: 37 Height: 162.6cm Weight: 60.8kg BSA: 1.58m^2 Pt. Status: Room:  PERFORMING National, [place] Ravens, Amy SONOGRAPHER Mauricio Po, RDCS, CCT cc:  ------------------------------------------------------------ LV EF: 55% - 60%  ------------------------------------------------------------ History: PMH: Breast Cancer   ASSESSMENT/PLAN:  63 y.o. [name], [place] woman status post right breast upper outer quadrant and right axillary lymph node biopsy 12/23/2012 for a clinical T3 N1 M1, stage IV invasive ductal carcinoma, grade 3, triple negative, with an MIB-1 of 86%.  (1) right liver lobe biopsy 01/21/2013 confirms metastatic adenocarcinoma; scans show involvement of the liver, lungs, and likely bone.  (2) enrolled in [facility] study [id number] (docetaxel + oral gamma secretase inibitor [id number]), received one cycle starting 02/04/2013  but withdrew because of poor tolerance despite treatment interruptions and decreased dosing of the oral component  (3)  chest CT in early November 2014 was compared with studies in August 2014 and showed interval progression of metastatic breast cancer, with an enlarging primary right breast mass, enlarging right axillary lymph nodes, enlarging pulmonary nodules and new/enlarging hepatic metastases.  (4) Abraxane started 03/19/2013, given day 1 and day 8 of each 21 day cycle, stopped with the day 1 cycle 3 dose (04/29/2013) because of local progression of disease  (5) cyclophosphamide and doxorubicin started 05/20/2013, given in dose dense fashion  with Neulasta support on day #2.  The plan is to complete 4 cycles, to be followed by definitive local treatment.  (6) The dose of the cyclophosphamide has been decreased to 500 mg/m2 and doxorubicin decrease it to 50 mg per square meter with the next chemotherapy in view of severe side effects  (7) neutropenic fever -resolved: Continue antibiotics as scheduled. She'll be getting Neulasta therapy the day after  each AC chemotherapy  (8) persistent nausea secondary to chemotherapy induced: Will arrange for  IV Zofran  8mg  for nausea and vomiting today. Continue nausea vomiting medication as scheduled on  Discharge. We'll also arrange for the CT of the abdomen to rule out any metastasis  (9) poor po intake secondary to oral mucositis/nausea and vomiting: Will arrange for 1071ml of normal saline to be given today. I've asked the patient to use biotin mouthwash every 4 hours.   (10) fatigue secondary to chemotherapy  We will reschedule today's chemotherapy to 07/03/2013, since patient is recovering from recent illness   All questions were answered. The patient knows to call the clinic with any problems, questions or concerns. We can certainly see the patient much sooner if necessary.  I spent 20 minutes counseling the patient face to face. The total time spent in the appointment was 30 minutes.   Wilmon Arms, MD  Medical Oncology   07/01/2013 2:03 PM

## 2013-07-02 ENCOUNTER — Ambulatory Visit: Payer: BC Managed Care – PPO

## 2013-07-03 ENCOUNTER — Ambulatory Visit (HOSPITAL_BASED_OUTPATIENT_CLINIC_OR_DEPARTMENT_OTHER): Payer: BC Managed Care – PPO

## 2013-07-03 ENCOUNTER — Other Ambulatory Visit: Payer: Self-pay | Admitting: Hematology and Oncology

## 2013-07-03 VITALS — BP 111/73 | HR 108 | Temp 98.7°F | Resp 18

## 2013-07-03 DIAGNOSIS — C787 Secondary malignant neoplasm of liver and intrahepatic bile duct: Secondary | ICD-10-CM

## 2013-07-03 DIAGNOSIS — C7800 Secondary malignant neoplasm of unspecified lung: Secondary | ICD-10-CM

## 2013-07-03 DIAGNOSIS — C50411 Malignant neoplasm of upper-outer quadrant of right female breast: Secondary | ICD-10-CM

## 2013-07-03 DIAGNOSIS — C50419 Malignant neoplasm of upper-outer quadrant of unspecified female breast: Secondary | ICD-10-CM

## 2013-07-03 DIAGNOSIS — Z5111 Encounter for antineoplastic chemotherapy: Secondary | ICD-10-CM

## 2013-07-03 MED ORDER — DEXAMETHASONE SODIUM PHOSPHATE 20 MG/5ML IJ SOLN
12.0000 mg | Freq: Once | INTRAMUSCULAR | Status: AC
  Administered 2013-07-03: 12 mg via INTRAVENOUS

## 2013-07-03 MED ORDER — PALONOSETRON HCL INJECTION 0.25 MG/5ML
INTRAVENOUS | Status: AC
  Filled 2013-07-03: qty 5

## 2013-07-03 MED ORDER — SODIUM CHLORIDE 0.9 % IV SOLN
150.0000 mg | Freq: Once | INTRAVENOUS | Status: AC
  Administered 2013-07-03: 150 mg via INTRAVENOUS
  Filled 2013-07-03: qty 5

## 2013-07-03 MED ORDER — SODIUM CHLORIDE 0.9 % IJ SOLN
10.0000 mL | INTRAMUSCULAR | Status: DC | PRN
  Administered 2013-07-03: 10 mL
  Filled 2013-07-03: qty 10

## 2013-07-03 MED ORDER — DOXORUBICIN HCL CHEMO IV INJECTION 2 MG/ML
50.0000 mg/m2 | Freq: Once | INTRAVENOUS | Status: AC
  Administered 2013-07-03: 82 mg via INTRAVENOUS
  Filled 2013-07-03: qty 41

## 2013-07-03 MED ORDER — HEPARIN SOD (PORK) LOCK FLUSH 100 UNIT/ML IV SOLN
500.0000 [IU] | Freq: Once | INTRAVENOUS | Status: AC | PRN
  Administered 2013-07-03: 500 [IU]
  Filled 2013-07-03: qty 5

## 2013-07-03 MED ORDER — PALONOSETRON HCL INJECTION 0.25 MG/5ML
0.2500 mg | Freq: Once | INTRAVENOUS | Status: AC
  Administered 2013-07-03: 0.25 mg via INTRAVENOUS

## 2013-07-03 MED ORDER — DEXAMETHASONE SODIUM PHOSPHATE 20 MG/5ML IJ SOLN
INTRAMUSCULAR | Status: AC
  Filled 2013-07-03: qty 5

## 2013-07-03 MED ORDER — SODIUM CHLORIDE 0.9 % IV SOLN
500.0000 mg/m2 | Freq: Once | INTRAVENOUS | Status: AC
  Administered 2013-07-03: 820 mg via INTRAVENOUS
  Filled 2013-07-03: qty 41

## 2013-07-03 MED ORDER — SODIUM CHLORIDE 0.9 % IV SOLN
Freq: Once | INTRAVENOUS | Status: AC
  Administered 2013-07-03: 15:00:00 via INTRAVENOUS

## 2013-07-03 NOTE — Patient Instructions (Signed)
[facility] Discharge Instructions for Patients Receiving Chemotherapy  Today you received the following chemotherapy agents Adriamycin and Cytoxan.  To help prevent nausea and vomiting after your treatment, we encourage you to take your nausea medication as prescribed.   If you develop nausea and vomiting that is not controlled by your nausea medication, call the clinic.   BELOW ARE SYMPTOMS THAT SHOULD BE REPORTED IMMEDIATELY:  *FEVER GREATER THAN 100.5 F  *CHILLS WITH OR WITHOUT FEVER  NAUSEA AND VOMITING THAT IS NOT CONTROLLED WITH YOUR NAUSEA MEDICATION  *UNUSUAL SHORTNESS OF BREATH  *UNUSUAL BRUISING OR BLEEDING  TENDERNESS IN MOUTH AND THROAT WITH OR WITHOUT PRESENCE OF ULCERS  *URINARY PROBLEMS  *BOWEL PROBLEMS  UNUSUAL RASH Items with * indicate a potential emergency and should be followed up as soon as possible.  Feel free to call the clinic you have any questions or concerns. The clinic phone number is [phone].    

## 2013-07-03 NOTE — Progress Notes (Signed)
Brisk blood return noted before, during and at end of adriamycin administration.

## 2013-07-04 ENCOUNTER — Ambulatory Visit: Payer: BC Managed Care – PPO

## 2013-07-04 ENCOUNTER — Other Ambulatory Visit: Payer: Self-pay | Admitting: *Deleted

## 2013-07-04 ENCOUNTER — Ambulatory Visit (HOSPITAL_BASED_OUTPATIENT_CLINIC_OR_DEPARTMENT_OTHER): Payer: BC Managed Care – PPO

## 2013-07-04 VITALS — BP 112/73 | HR 94 | Temp 98.2°F | Resp 18

## 2013-07-04 DIAGNOSIS — C50419 Malignant neoplasm of upper-outer quadrant of unspecified female breast: Secondary | ICD-10-CM

## 2013-07-04 DIAGNOSIS — Z5189 Encounter for other specified aftercare: Secondary | ICD-10-CM

## 2013-07-04 DIAGNOSIS — E860 Dehydration: Secondary | ICD-10-CM

## 2013-07-04 DIAGNOSIS — C7800 Secondary malignant neoplasm of unspecified lung: Secondary | ICD-10-CM

## 2013-07-04 DIAGNOSIS — R110 Nausea: Secondary | ICD-10-CM

## 2013-07-04 DIAGNOSIS — C50411 Malignant neoplasm of upper-outer quadrant of right female breast: Secondary | ICD-10-CM

## 2013-07-04 MED ORDER — SODIUM CHLORIDE 0.9 % IV SOLN: INTRAVENOUS | Status: DC

## 2013-07-04 MED ORDER — SODIUM CHLORIDE 0.9 % IJ SOLN
10.0000 mL | INTRAMUSCULAR | Status: DC | PRN
  Administered 2013-07-04: 10 mL via INTRAVENOUS
  Filled 2013-07-04: qty 10

## 2013-07-04 MED ORDER — PEGFILGRASTIM INJECTION 6 MG/0.6ML
6.0000 mg | Freq: Once | SUBCUTANEOUS | Status: AC
  Administered 2013-07-04: 6 mg via SUBCUTANEOUS
  Filled 2013-07-04: qty 0.6

## 2013-07-04 MED ORDER — HEPARIN SOD (PORK) LOCK FLUSH 100 UNIT/ML IV SOLN
500.0000 [IU] | Freq: Once | INTRAVENOUS | Status: AC
  Administered 2013-07-04: 500 [IU] via INTRAVENOUS
  Filled 2013-07-04: qty 5

## 2013-07-04 MED ORDER — SODIUM CHLORIDE 0.9 % IV SOLN
Freq: Once | INTRAVENOUS | Status: AC
  Administered 2013-07-04: 12:00:00 via INTRAVENOUS

## 2013-07-04 NOTE — Progress Notes (Signed)
Patient requests to have IVFs today with neulasta shot but no orders in. Per Dr. Earnest Conroy, okay to give patient 1L NS at 500 ml/hr.

## 2013-07-04 NOTE — Patient Instructions (Signed)
Dehydration, Adult Dehydration is when you lose more fluids from the body than you take in. Vital organs like the kidneys, brain, and heart cannot function without a proper amount of fluids and salt. Any loss of fluids from the body can cause dehydration.  CAUSES   Vomiting.  Diarrhea.  Excessive sweating.  Excessive urine output.  Fever. SYMPTOMS  Mild dehydration  Thirst.  Dry lips.  Slightly dry mouth. Moderate dehydration  Very dry mouth.  Sunken eyes.  Skin does not bounce back quickly when lightly pinched and released.  Dark urine and decreased urine production.  Decreased tear production.  Headache. Severe dehydration  Very dry mouth.  Extreme thirst.  Rapid, weak pulse (more than 100 beats per minute at rest).  Cold hands and feet.  Not able to sweat in spite of heat and temperature.  Rapid breathing.  Blue lips.  Confusion and lethargy.  Difficulty being awakened.  Minimal urine production.  No tears. DIAGNOSIS  Your caregiver will diagnose dehydration based on your symptoms and your exam. Blood and urine tests will help confirm the diagnosis. The diagnostic evaluation should also identify the cause of dehydration. TREATMENT  Treatment of mild or moderate dehydration can often be done at home by increasing the amount of fluids that you drink. It is best to drink small amounts of fluid more often. Drinking too much at one time can make vomiting worse. Refer to the home care instructions below. Severe dehydration needs to be treated at the hospital where you will probably be given intravenous (IV) fluids that contain water and electrolytes. HOME CARE INSTRUCTIONS   Ask your caregiver about specific rehydration instructions.  Drink enough fluids to keep your urine clear or pale yellow.  Drink small amounts frequently if you have nausea and vomiting.  Eat as you normally do.  Avoid:  Foods or drinks high in sugar.  Carbonated  drinks.  Juice.  Extremely hot or cold fluids.  Drinks with caffeine.  Fatty, greasy foods.  Alcohol.  Tobacco.  Overeating.  Gelatin desserts.  Wash your hands well to avoid spreading bacteria and viruses.  Only take over-the-counter or prescription medicines for pain, discomfort, or fever as directed by your caregiver.  Ask your caregiver if you should continue all prescribed and over-the-counter medicines.  Keep all follow-up appointments with your caregiver. SEEK MEDICAL CARE IF:  You have abdominal pain and it increases or stays in one area (localizes).  You have a rash, stiff neck, or severe headache.  You are irritable, sleepy, or difficult to awaken.  You are weak, dizzy, or extremely thirsty. SEEK IMMEDIATE MEDICAL CARE IF:   You are unable to keep fluids down or you get worse despite treatment.  You have frequent episodes of vomiting or diarrhea.  You have blood or green matter (bile) in your vomit.  You have blood in your stool or your stool looks black and tarry.  You have not urinated in 6 to 8 hours, or you have only urinated a small amount of very dark urine.  You have a fever.  You faint. MAKE SURE YOU:   Understand these instructions.  Will watch your condition.  Will get help right away if you are not doing well or get worse. Document Released: 05/01/2005 Document Revised: 07/24/2011 Document Reviewed: 12/19/2010 ExitCare Patient Information 2014 [employer].  Pegfilgrastim injection What is this medicine? PEGFILGRASTIM (peg fil GRA stim) helps the body make more white blood cells. It is used to prevent infection in people   with low amounts of white blood cells following cancer treatment. This medicine may be used for other purposes; ask your health care provider or pharmacist if you have questions. COMMON BRAND NAME(S): Neulasta What should I tell my health care provider before I take this medicine? They need to know if you have  any of these conditions: -sickle cell disease -an unusual or allergic reaction to pegfilgrastim, filgrastim, E.coli protein, other medicines, foods, dyes, or preservatives -pregnant or trying to get pregnant -breast-feeding How should I use this medicine? This medicine is for injection under the skin. It is usually given by a health care professional in a hospital or clinic setting. If you get this medicine at home, you will be taught how to prepare and give this medicine. Do not shake this medicine. Use exactly as directed. Take your medicine at regular intervals. Do not take your medicine more often than directed. It is important that you put your used needles and syringes in a special sharps container. Do not put them in a trash can. If you do not have a sharps container, call your pharmacist or healthcare provider to get one. Talk to your pediatrician regarding the use of this medicine in children. While this drug may be prescribed for children who weigh more than 45 kg for selected conditions, precautions do apply Overdosage: If you think you have taken too much of this medicine contact a poison control center or emergency room at once. NOTE: This medicine is only for you. Do not share this medicine with others. What if I miss a dose? If you miss a dose, take it as soon as you can. If it is almost time for your next dose, take only that dose. Do not take double or extra doses. What may interact with this medicine? -lithium -medicines for growth therapy This list may not describe all possible interactions. Give your health care provider a list of all the medicines, herbs, non-prescription drugs, or dietary supplements you use. Also tell them if you smoke, drink alcohol, or use illegal drugs. Some items may interact with your medicine. What should I watch for while using this medicine? Visit your doctor for regular check ups. You will need important blood work done while you are taking this  medicine. What side effects may I notice from receiving this medicine? Side effects that you should report to your doctor or health care professional as soon as possible: -allergic reactions like skin rash, itching or hives, swelling of the face, lips, or tongue -breathing problems -fever -pain, redness, or swelling where injected -shoulder pain -stomach or side pain Side effects that usually do not require medical attention (report to your doctor or health care professional if they continue or are bothersome): -aches, pains -headache -loss of appetite -nausea, vomiting -unusually tired This list may not describe all possible side effects. Call your doctor for medical advice about side effects. You may report side effects to FDA at 1-800-FDA-1088. Where should I keep my medicine? Keep out of the reach of children. Store in a refrigerator between 2 and 8 degrees C (36 and 46 degrees F). Do not freeze. Keep in carton to protect from light. Throw away this medicine if it is left out of the refrigerator for more than 48 hours. Throw away any unused medicine after the expiration date. NOTE: This sheet is a summary. It may not cover all possible information. If you have questions about this medicine, talk to your doctor, pharmacist, or health care provider.    2014, Elsevier/Gold Standard. (2007-12-02 15:41:44)   

## 2013-07-07 ENCOUNTER — Telehealth: Payer: Self-pay | Admitting: *Deleted

## 2013-07-07 ENCOUNTER — Ambulatory Visit (HOSPITAL_COMMUNITY): Payer: BC Managed Care – PPO

## 2013-07-07 ENCOUNTER — Other Ambulatory Visit: Payer: Self-pay | Admitting: *Deleted

## 2013-07-07 MED ORDER — ONDANSETRON 8 MG PO TBDP: 8.0000 mg | ORAL_TABLET | Freq: Three times a day (TID) | ORAL | Status: DC | PRN

## 2013-07-07 NOTE — Telephone Encounter (Signed)
Provider input needed: stomache pain, nausea, constipation, perhaps reschedule today's CT chest   Reason for call: Carrie Berry is "having a bad time"  Gastrointestinal: positive for constipation and nausea Pain   Patient last received chemotherapy/ treatment on 07-03-2013 4th cycle A/C  Patient was last seen in the office on 07-01-2013 by Dr. Earnest Conroy  Next appt is 07-08-2013 after CT scan today at 12:00  Is patient having fevers greater than 100.5?  no   Is patient having uncontrolled pain, or new pain? no, reports having stomach pain after every chemotherapy but this is the worst so far. Can hear Cara groaning in the background.  Is patient having new back pain that changes with position (worsens or eases when laying down?)  no   Is patient able to eat and drink? yes, but denies drinking the 64 oz minimum of water daily.    Is patient able to pass stool without difficulty?   no, Strained a little.     Is patient having uncontrolled nausea?  yes, out of zofran ODT but has taken compazine last at 0500, lorazepam and regular zofran at 0915 and has taken the medicine yesterday and last night but still nauseated.        Spouse calls 07/07/2013 with complaint of  Carrie Berry is "having a bad time.  Nauseated, has taken all anti-emetics and omeprazole, severe stomache pain.  She is drinking fluids.  LBM this morning, small round balls"  CT chest at noon she is not going to be able to make because she is in pain.  to start drinking contrast at 1000.  Took excedrin for pain.   Summary Based on the above information advised patient and family to Increae water intake and try hot tea or warm prunes.  May have dulcolax tabs may be in the home.  Spouse and patient to try the ultram for pain.  Report she only takes this at night.  Will notify providers and call [phone].   Cherylynn Ridges  07/07/2013, 9:43 AM   Background [place]   DOB: 08/03/1950   MR#: [id number]   CSN#    [id number] 07/07/2013

## 2013-07-07 NOTE — Telephone Encounter (Addendum)
Verbal order received and read back from Dr. [name] for Carrie Berry to take 4 colace and Miralax to relieve bowels. To try to have CT scans done later today if possible and he will see patient tomorrow.   Order given to spouse at this time.  CT department may perform scan later today.  Patient agrees to 5:00 scan time.  Instructed to arrive at 4:45 pm.  Will need to drink contrast at 3:00 pm and informed them there is a laxitive/softener in the contrast as well.

## 2013-07-07 NOTE — Telephone Encounter (Signed)
Called patient to touch base with how she is doing.  Report she has not been able to keep the contrast down so will not make the CT appointement.  "Bowels did move some but not as much as she thought they would."  Bowels were a little softer and longer.  Was not able to keep lunch down.  Will keep tomorrow's appointment with Dr. [name].

## 2013-07-07 NOTE — Telephone Encounter (Signed)
   Provider input needed: abdominal pain, nausea, constipation, possible re-schedule today's CT and f/u appointment.   Reason for call: "[name] is having a bad time"  Gastrointestinal: positive for constipation and nausea Abdominal pain   Patient last received chemotherapy/ treatment on 07-03-2013 4th cycle A/C  Patient was last seen in the office on 07-01-2013 by Dr. Earnest Conroy  Next appt is 07-08-2013 after today's CT Chest  Is patient having fevers greater than 100.5?  no   Is patient having uncontrolled pain, or new pain? no, report pain after every chemotherapy but today's pain is worse.  Took ultram last night as she does to sleep better.  Has taken Excedrin for the stomach pain.   Is patient having new back pain that changes with position (worsens or eases when laying down?)  no   Is patient able to eat and drink? yes, but denies drinking 64 oz water daily.    Is patient able to pass stool without difficulty?   no, having to strain for this mornings small round balls of BM    Is patient having uncontrolled nausea?  yes, Has taken compazine yesterday, last night and last at 0500.  Regular Zofran and lorazepam at 0915.    Spouse calls 07/07/2013 with complaint of  Gastrointestinal: positive for constipation and nausea Abdominal pain, farting, no abdominal swelling She can't begin drinking the contrast at 1000 the way she feels.     Summary Based on the above information advised spouse to await return call to [phone] with provider orders/instructions.  Also to increase liquid intake by drinking hot tea, warm prune juice and try ultram for pain.   Carrie Berry, Carrie Berry  07/07/2013, 10:01 AM   Background [place]   DOB: 28-Jun-1950   MR#: [id number]   CSN#   [id number] 07/07/2013

## 2013-07-08 ENCOUNTER — Other Ambulatory Visit: Payer: Self-pay | Admitting: Oncology

## 2013-07-08 ENCOUNTER — Ambulatory Visit: Payer: BC Managed Care – PPO | Admitting: Oncology

## 2013-07-08 ENCOUNTER — Telehealth: Payer: Self-pay | Admitting: *Deleted

## 2013-07-08 ENCOUNTER — Other Ambulatory Visit: Payer: BC Managed Care – PPO

## 2013-07-08 ENCOUNTER — Telehealth: Payer: Self-pay | Admitting: Dietician

## 2013-07-08 DIAGNOSIS — C50419 Malignant neoplasm of upper-outer quadrant of unspecified female breast: Secondary | ICD-10-CM

## 2013-07-08 DIAGNOSIS — G4700 Insomnia, unspecified: Secondary | ICD-10-CM

## 2013-07-08 NOTE — Telephone Encounter (Signed)
Brief Outpatient Oncology Nutrition Note  Patient has been identified to be at risk on malnutrition screen.  Wt Readings from Last 10 Encounters:  07/01/13 124 lb 14.4 oz (56.654 kg)  06/27/13 127 lb 12.8 oz (57.97 kg)  06/25/13 129 lb 6.4 oz (58.695 kg)  06/24/13 125 lb 12.8 oz (57.063 kg)  06/17/13 128 lb 11.2 oz (58.378 kg)  06/10/13 127 lb 3.2 oz (57.698 kg)  06/03/13 129 lb 14.4 oz (58.922 kg)  05/27/13 129 lb 9.6 oz (58.786 kg)  05/20/13 133 lb 12.8 oz (60.691 kg)  05/12/13 129 lb 11.2 oz (58.832 kg)   Dx:  Breast cancer metastasized to multiple sites undergoing chemo.  Called patient secondary to weight loss and noted difficulty with maintaining hydration secondary to nausea and vomiting.  Patient also with decreased appetite and taste alterations.  Encouraged intake of fluids to maintain hydration and nutrient dense foods throughout the day as able.  Does drink Boost daily as tolerated.  Will mail nutrition handouts:  Taste and Smell Changes, Making the Most of Each Bite, and tips with nausea and vomiting along with coupons for Boost and contact information for the [facility].  Antonieta Iba, RD, LDN

## 2013-07-08 NOTE — Telephone Encounter (Signed)
Patient was unable to get re-staging scans done for this appt today. We will r/s scans as well as labs/md visit.

## 2013-07-09 ENCOUNTER — Encounter: Payer: Self-pay | Admitting: Nutrition

## 2013-07-09 ENCOUNTER — Other Ambulatory Visit: Payer: BC Managed Care – PPO

## 2013-07-09 ENCOUNTER — Ambulatory Visit: Payer: BC Managed Care – PPO | Admitting: Oncology

## 2013-07-09 ENCOUNTER — Telehealth: Payer: Self-pay | Admitting: Oncology

## 2013-07-09 NOTE — Progress Notes (Signed)
Received call from patient's family requesting information on nausea strategies and oral nutrition supplements.  Educated them on ways to increase oral intake using well tolerated foods.  Recommended oral nutrition supplements.  Answered questions.  Family appreciative of information.

## 2013-07-09 NOTE — Telephone Encounter (Signed)
S/w the pt and she is aware of her lab and md visit on 07/18/2013.

## 2013-07-10 ENCOUNTER — Ambulatory Visit (HOSPITAL_COMMUNITY): Payer: BC Managed Care – PPO

## 2013-07-10 ENCOUNTER — Telehealth: Payer: Self-pay | Admitting: *Deleted

## 2013-07-10 NOTE — Telephone Encounter (Signed)
   Provider input needed: Abdominal pain   Reason for call: unrelieved stomach pain x 3 days with resolved constipation.    mid-navel abdominal pain   Patient last received chemotherapy/ treatment on 07-03-2013 4th cycle A/C  Patient was last seen in the office on 07-01-2013.  Next appt is 06-21-2013  Is patient having fevers greater than 100.5?  no   Is patient having uncontrolled pain, or new pain? Yes, "constant, dull, rolling, gurgling, noisy pain to mid abdomen at navel that has been present for three days un-releived with ultram taken last at 12:00pm.    Is patient having new back pain that changes with position (worsens or eases when laying down?)  no   Is patient able to eat and drink? yes    Is patient able to pass stool without difficulty?   yes, Has taken miralax and stool softeners and no longer constipated.  Massive soft stools overnight.     Is patient having uncontrolled nausea?  no    family calls 07/10/2013 with complaint of  Very pale.  Vitals stable with B/P = 116/70 pulse = 85.  Keeping food down.  Unrelieved abdominal pain x 3 days.  Soft non-distended abdomenthat is soft.   Summary Based on the above information advised family to await return call to [phone].   Winston-Spruiell, Sherrian Nunnelley  07/10/2013, 2:26 PM   Background [place]   DOB: [birth year]   MR#: [id number]   CSN#   [id number] 07/10/2013

## 2013-07-10 NOTE — Telephone Encounter (Signed)
This RN spoke with pt per call regarding current concerns of abdominal discomfort post therapy for constipation.  Pain is noted as dull constant " right in the middle of my stomach"  Pain is not excruciating nor associated with nausea or vomiting,  Per discussion pt will use Gas X for discomfort

## 2013-07-11 ENCOUNTER — Ambulatory Visit (HOSPITAL_BASED_OUTPATIENT_CLINIC_OR_DEPARTMENT_OTHER): Payer: BC Managed Care – PPO | Admitting: Oncology

## 2013-07-11 ENCOUNTER — Ambulatory Visit (HOSPITAL_BASED_OUTPATIENT_CLINIC_OR_DEPARTMENT_OTHER): Payer: BC Managed Care – PPO

## 2013-07-11 ENCOUNTER — Telehealth: Payer: Self-pay | Admitting: *Deleted

## 2013-07-11 ENCOUNTER — Other Ambulatory Visit: Payer: Self-pay | Admitting: *Deleted

## 2013-07-11 VITALS — BP 118/73 | HR 102 | Temp 98.8°F | Resp 20

## 2013-07-11 DIAGNOSIS — C50419 Malignant neoplasm of upper-outer quadrant of unspecified female breast: Secondary | ICD-10-CM

## 2013-07-11 DIAGNOSIS — E860 Dehydration: Secondary | ICD-10-CM

## 2013-07-11 DIAGNOSIS — C7800 Secondary malignant neoplasm of unspecified lung: Secondary | ICD-10-CM

## 2013-07-11 DIAGNOSIS — C50411 Malignant neoplasm of upper-outer quadrant of right female breast: Secondary | ICD-10-CM

## 2013-07-11 DIAGNOSIS — C787 Secondary malignant neoplasm of liver and intrahepatic bile duct: Secondary | ICD-10-CM

## 2013-07-11 LAB — CBC WITH DIFFERENTIAL/PLATELET
BASO%: 6.3 % — AB (ref 0.0–2.0)
Basophils Absolute: 0.1 10*3/uL (ref 0.0–0.1)
EOS%: 1.4 % (ref 0.0–7.0)
Eosinophils Absolute: 0 10*3/uL (ref 0.0–0.5)
HCT: 26.9 % — ABNORMAL LOW (ref 34.8–46.6)
HGB: 9.1 g/dL — ABNORMAL LOW (ref 11.6–15.9)
LYMPH%: 39.6 % (ref 14.0–49.7)
MCH: 29.4 pg (ref 25.1–34.0)
MCHC: 33.8 g/dL (ref 31.5–36.0)
MCV: 87.1 fL (ref 79.5–101.0)
MONO#: 0.4 10*3/uL (ref 0.1–0.9)
MONO%: 25 % — AB (ref 0.0–14.0)
NEUT#: 0.4 10*3/uL — CL (ref 1.5–6.5)
NEUT%: 27.7 % — AB (ref 38.4–76.8)
NRBC: 0 % (ref 0–0)
Platelets: 216 10*3/uL (ref 145–400)
RBC: 3.09 10*6/uL — AB (ref 3.70–5.45)
RDW: 16.6 % — ABNORMAL HIGH (ref 11.2–14.5)
WBC: 1.4 10*3/uL — ABNORMAL LOW (ref 3.9–10.3)
lymph#: 0.6 10*3/uL — ABNORMAL LOW (ref 0.9–3.3)

## 2013-07-11 MED ORDER — SODIUM CHLORIDE 0.9 % IV SOLN: Freq: Once | INTRAVENOUS | Status: DC

## 2013-07-11 MED ORDER — SODIUM CHLORIDE 0.9 % IV SOLN
1000.0000 mL | INTRAVENOUS | Status: DC
  Administered 2013-07-11: 1000 mL via INTRAVENOUS

## 2013-07-11 MED ORDER — HEPARIN SOD (PORK) LOCK FLUSH 100 UNIT/ML IV SOLN
500.0000 [IU] | Freq: Once | INTRAVENOUS | Status: AC
  Administered 2013-07-11: 500 [IU] via INTRAVENOUS
  Filled 2013-07-11: qty 5

## 2013-07-11 MED ORDER — AZITHROMYCIN 1 G PO PACK: 1.0000 g | PACK | Freq: Once | ORAL | Status: DC

## 2013-07-11 MED ORDER — SODIUM CHLORIDE 0.9 % IJ SOLN
10.0000 mL | INTRAMUSCULAR | Status: DC | PRN
  Administered 2013-07-11: 10 mL via INTRAVENOUS
  Filled 2013-07-11: qty 10

## 2013-07-11 NOTE — Patient Instructions (Signed)

## 2013-07-11 NOTE — Telephone Encounter (Signed)
Received call from pt's sister-in-law, Carrie Berry stating that pt is so weak that she can hardly walk & wants to bring pt in for labs & would like to avoid going to the ED.  She thinks pt's hgb is low or she is dehydrated.  Discussed with Dr [name] & he suggested she come in for labs & IVF & he will try to see her in the infusion room but may need to go to the ED if she looks bad.  This was relayed to [facility] & they should be here @ 3:40 pm.  Message to scheduler to put on schedule.

## 2013-07-11 NOTE — Progress Notes (Signed)
Patient ID: Carrie Berry, female   DOB: Oct 27, 1950, 63 y.o.   MRN: [id number] ID: [name] OB: Dec 10, 1950  MR#: [id number]  CSN#:[id number]  PCP: [name], MD GYN:   SU: [name] OTHER MD: Thea Silversmith, Earle Gell, Rick Cornella, Erline Levine  CHIEF COMPLAINT:  Metastatic Breast Cancer   HISTORY OF PRESENT ILLNESS: Carrie Berry noted a mass in her right breast December of 2013, but did not think much of it. It did grow some lower the summer, but she was keeping her grandchild at that time and was too busy so she did not bring it to Dr. Bernell List attention until August. He set her up for bilateral mammography and ultrasonography at [facility] 12/18/2012 and this measured, on the right, a large irregular lobulated mass in the upper outer quadrant which by ultrasound measured 5.7 cm. The right axilla showed some lymph nodes with thickened cortices. In the left breast there was a focal asymmetry at the depth, lateral to the nipple, and by ultrasound there was a 9 mm ill-defined hypoechoic lesion in this location. Biopsy of the left breast lesion showed only fibrocystic changes.  Biopsy of the right breast mass and right axillary adenopathy ([id number]) on 12/23/2012 showed both to be involved by invasive ductal carcinoma, grade 2, triple negative, with an MIB-1 of 86%.  MRI of the breast 12/27/2012 at [facility] imaging showed in the right breast a mass abutting the pectoralis muscle without enhancement of the muscle measuring 4.8 cm. The satellite nodule measuring approximately 3 mm was also noted superior to the mass and several abnormal and enlarged right axillary lymph nodes were noted, one of which appeared to be necrotic. The largest node measured 2.0 cm. Unfortunately, numerous bilateral pulmonary nodules were also noted.  The patient's subsequent history is as detailed below   INTERVAL HISTORY: Carrie Berry returns accompanied by her husband today for an unscheduled visit. She is day 9 cycle  4 of cyclophosphamide and doxorubicin. She developed significant constipation earlier this week, and MiraLAX and stool softeners and relieved that problem, then has had pain in the midepigastrium for the last day or 2, but even more than once bothering her is severe fatigue. Her husband has had to help her take a bath.  Today they called asking to be seen because she was afraid she was going to end up in the emergency room if we did not intervene.   REVIEW OF SYSTEMS:   Carrie Berry has been having morning headaches she tells me since she started the chemotherapy. She has had a little bit of nausea as well. She has been taking mostly liquids, not just clear liquids but thinks like Ensure, for the last 24 hours. She still has a little bit of epigastric pain. She is actually no fever, no bleeding, and no rash. There have been no visual changes. She has not been dizzy. Last bowel movement was this morning I was unremarkable A detailed review of systems today was otherwise noncontributory    PAST MEDICAL HISTORY: Past Medical History  Diagnosis Date  . Recurrent sinus infections   . Arthritis   . PONV (postoperative nausea and vomiting)   . Anxiety   . Cancer     breast  . Breast cancer   . GERD (gastroesophageal reflux disease)   . 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 Tubal ligation    . Tubal ligation  1985  . Portacath  placement N/A 01/10/2013    Procedure: ULTRASOUND GUIDED PORT-A-CATH INSERTION WITH FLUOROSCOPY;  Surgeon: [name], MD;  Location: [facility];  Service: General;  Laterality: N/A;    FAMILY HISTORY Family History  Problem Relation Age of Onset  . Bladder Cancer Father   . Colon cancer Maternal Grandmother    the patient's parents are living, both in their 13s. The patient's father was diagnosed with bladder cancer the age of 93. The patient's mother's mother had some type  of gastrointestinal cancer. The patient had one brother, no sisters. There is no history of breast or ovarian cancer in the family other than a cousin on the father's side who was diagnosed with breast cancer apparently before the age of 9.  GYNECOLOGIC HISTORY:  Menarche age 49, first live birth age 78, the patient is [place] P2. She went through menopause in 2008. She did not take hormone replacement. She took birth control for approximately 22 years remotely.  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 She's currently on disability. Her husband Carrie Berry"), is vice Development worker, international aid for [employer]. The patient's daughter Carrie Berry is a stay-at-home mom in [place], the patient's son Carrie Berry unfortunately died in an automobile accident in December of 2011. The patient has 2 grandchildren. She attends to a local [employer].    ADVANCED DIRECTIVES: Not in place   HEALTH MAINTENANCE: (Updated December 2014) History  Substance Use Topics  . Smoking status: Never Smoker   . Smokeless tobacco: Never Used  . Alcohol Use: No     Colonoscopy: 2005  PAP: Not on file  Bone density: Never  Lipid panel:  Dr. Delfina Redwood   Allergies  Allergen Reactions  . Ondansetron Hcl Other (See Comments)    HEADACHE, patient states she still takes   . Codeine Nausea And Vomiting    "violently ill"  . Penicillins Rash    Childhood reaction -    Current Outpatient Prescriptions  Medication Sig Dispense Refill  . acetaminophen (TYLENOL) 325 MG tablet Take 650 mg by mouth every 8 (eight) hours as needed for pain.      Marland Kitchen aspirin-acetaminophen-caffeine (EXCEDRIN MIGRAINE) 250-250-65 MG per tablet Take 1 tablet by mouth every 6 (six) hours as needed for headache.      Marland Kitchen azithromycin (ZITHROMAX) 1 G powder Take 1 packet (1 g total) by mouth once.  1 each  0  . dexamethasone  (DECADRON) 4 MG tablet Take 2 tablets by mouth once a day on the day after chemotherapy and then take 2 tablets two times a day for 2 days. Take with food.  30 tablet  1  . lidocaine-prilocaine (EMLA) cream Apply topically as needed.  30 g  3  . LORazepam (ATIVAN) 0.5 MG tablet Take 1 tablet (0.5 mg total) by mouth every 8 (eight) hours as needed for anxiety.  30 tablet  0  . omeprazole (PRILOSEC) 20 MG capsule Take 1 capsule (20 mg total) by mouth 2 (two) times daily.  60 capsule  3  . ondansetron (ZOFRAN ODT) 4 MG disintegrating tablet 2mg  ODT q4 hours prn vomiting  4 tablet  0  . ondansetron (ZOFRAN ODT) 8 MG disintegrating tablet Take 1 tablet (8 mg total) by mouth every 8 (eight) hours as needed for nausea or vomiting.  20 tablet  0  . ondansetron (ZOFRAN) 8 MG tablet Take 8 mg by mouth every  8 (eight) hours as needed for nausea or vomiting.      Marland Kitchen PATADAY 0.2 % SOLN Place 1 drop into both eyes daily as needed (dry eyes).       . prochlorperazine (COMPAZINE) 10 MG tablet Take 1 tablet (10 mg total) by mouth every 6 (six) hours as needed for nausea.  30 tablet  2  . promethazine (PHENERGAN) 25 MG tablet       . tobramycin-dexamethasone (TOBRADEX) ophthalmic solution Place 1 drop into both eyes daily.  5 mL  1  . traMADol (ULTRAM) 50 MG tablet Take 1 tablet (50 mg total) by mouth every 6 (six) hours as needed for moderate pain.  60 tablet  0  . traZODone (DESYREL) 50 MG tablet Take 100 mg by mouth. Every other night at bedtime as needed  for sleep      . triamcinolone (NASACORT) 55 MCG/ACT nasal inhaler Place 2 sprays into the nose daily.      Marland Kitchen zolpidem (AMBIEN) 5 MG tablet TAKE 1 TABLET BY MOUTH EVERY DAY AT BEDTIME AS NEEDED FOR SLEEP  20 tablet  0   Current Facility-Administered Medications  Medication Dose Route Frequency Provider Last Rate Last Dose  . 0.9 %  sodium chloride infusion   Intravenous Once Chauncey Cruel, MD        OBJECTIVE: Middle-aged white woman examined in a  recliner in the treatment area There were no vitals filed for this visit.   There is no weight on file to calculate BMI.    ECOG FS: 1 There were no vitals filed for this visit.  Vitals - 1 value per visit 99991111  SYSTOLIC [id number]  DIASTOLIC 73  Pulse [id number]  Temperature 98.8  Respirations 20  Weight (lb)   Height   BMI   VISIT REPORT    Sclerae unicteric, EOMs intact Oropharynx clear and moist-- no thrush No cervical or supraclavicular adenopathy Lungs no rales or rhonchi, auscultated anterolaterally Heart regular rate and rhythm Abd soft, perhaps slightly distended, positive bowel sounds, nontender to moderate palpation MSK no upper extremity lymphedema Neuro: nonfocal, well oriented, positive affect  affect Breasts: deferred  LAB RESULTS:   Lab Results  Component Value Date   WBC 1.4* 07/11/2013   NEUTROABS 0.4* 07/11/2013   HGB 9.1* 07/11/2013   HCT 26.9* 07/11/2013   MCV 87.1 07/11/2013   PLT 216 07/11/2013      Chemistry      Component Value Date/Time   NA 135 07/01/2013 1022   NA 135* 06/24/2013 1050   K 4.1 07/01/2013 1022   K 3.9 06/24/2013 1050   CL 98 07/01/2013 1022   CO2 27 07/01/2013 1022   CO2 25 06/24/2013 1050   BUN 11 07/01/2013 1022   BUN 12.7 06/24/2013 1050   CREATININE 0.65 07/01/2013 1022   CREATININE 0.7 06/24/2013 1050      Component Value Date/Time   CALCIUM 8.8 07/01/2013 1022   CALCIUM 9.6 06/24/2013 1050   ALKPHOS 117 06/24/2013 2245   ALKPHOS 126 06/24/2013 1050   AST 22 06/24/2013 2245   AST 15 06/24/2013 1050   ALT 28 06/24/2013 2245   ALT 25 06/24/2013 1050   BILITOT 0.3 06/24/2013 2245   BILITOT 0.58 06/24/2013 1050        STUDIES: Dg Chest 2 View  06/24/2013   CLINICAL DATA:  Fever.  Cough for 1 day.  EXAM: CHEST  2 VIEW  COMPARISON:  DG ABD ACUTE W/CHEST dated 06/23/2013; CT CHEST  W/CM dated 03/17/2013; CT CHEST W/CM dated 05/16/2013  FINDINGS: Pulmonary nodules demonstrated on prior CT are difficult to visualize radiographically. Right IJ  Port-A-Cath appears unchanged. Chronic bronchitic changes at the bases.  No airspace disease or pleural effusion identified. No interval change. Cardiopericardial silhouette is within normal limits.  IMPRESSION: No interval change or acute abnormality. Pulmonary nodules demonstrated on prior chest CT compatible with metastatic disease are difficult to resolve radiographically.   Electronically Signed   By: Dereck Ligas M.D.   On: 06/24/2013 23:03   Dg Abd Acute W/chest  06/23/2013   CLINICAL DATA:  Umbilical pain. History of breast cancer with pulmonary metastases.  EXAM: ACUTE ABDOMEN SERIES (ABDOMEN 2 VIEW & CHEST 1 VIEW)  COMPARISON:  CT chest 05/16/2013  FINDINGS: Port-A-Cath remains in place. Pulmonary nodules are noted but better demonstrated on CT scan. The lungs are otherwise clear. Heart size is normal. No pneumothorax or pleural fluid.  Two views of the abdomen show no free intraperitoneal air. The bowel gas pattern is normal.  IMPRESSION: No acute finding chest or abdomen. Pulmonary nodules consistent with metastatic breast cancer are noted as seen on prior CT.   Electronically Signed   By: Inge Rise M.D.   On: 06/23/2013 20:49   ASSESSMENT: 63 y.o. [name], [place] woman status post right breast upper outer quadrant and right axillary lymph node biopsy 12/23/2012 for a clinical T3 N1 M1, stage IV invasive ductal carcinoma, grade 3, triple negative, with an MIB-1 of 86%.  (1) right liver lobe biopsy 01/21/2013 confirms metastatic adenocarcinoma; scans show involvement of the liver, lungs, and likely bone.  (2) enrolled in [facility] study [id number] (docetaxel + oral gamma secretase inibitor [id number]), received one cycle starting 02/04/2013  but withdrew because of poor tolerance despite treatment interruptions and decreased dosing of the oral component  (3)  chest CT in early November 2014 was compared with studies in August 2014 and showed interval progression of metastatic breast cancer, with  an enlarging primary right breast mass, enlarging right axillary lymph nodes, enlarging pulmonary nodules and new/enlarging hepatic metastases.  (4) Abraxane started 03/19/2013, given day 1 and day 8 of each 21 day cycle, stopped with the day 1 cycle 3 dose (04/29/2013) because of local progression of disease  (5) cyclophosphamide and doxorubicin started 05/20/2013, given in dose dense fashion  with Neulasta support on day 2.  The plan is to complete 4 cycles, to be followed by definitive local treatment.   PLAN: Jinger has had a hard time completing her 4 cycles of Cytoxan/doxorubicin, but she has done that and she is going to be restaged next week. I think we will see a response, but clearly we cannot continue this chemotherapy.  I am not sure why she is having some epigastric discomfort. She is on omeprazole twice a day and we are going to continue that. She is slightly dehydrated, and she will receive fluids today and tomorrow. She is not febrile but is having some sinus symptoms. I am starting her on a Z-Pak for that and a prescription has been called in for her.  I think the headaches are gone and proved to be sinus related. But I have added an MRI of the brain for to be done on the same day as her CT scan. She will see me a couple of days later to review those results. She appears to be benefiting from the fluids and we will repeat dose tomorrow morning.  Christiona has a good understanding of  this plan. She agrees with it. If her condition worsens over the weekend she will go to the emergency room.     Chauncey Cruel, MD   07/11/2013 4:46 PM

## 2013-07-12 ENCOUNTER — Ambulatory Visit (HOSPITAL_BASED_OUTPATIENT_CLINIC_OR_DEPARTMENT_OTHER): Payer: BC Managed Care – PPO

## 2013-07-12 ENCOUNTER — Other Ambulatory Visit: Payer: Self-pay | Admitting: Medical Oncology

## 2013-07-12 VITALS — BP 123/70 | HR 104 | Resp 18

## 2013-07-12 DIAGNOSIS — C7800 Secondary malignant neoplasm of unspecified lung: Secondary | ICD-10-CM

## 2013-07-12 DIAGNOSIS — E860 Dehydration: Secondary | ICD-10-CM

## 2013-07-12 DIAGNOSIS — C787 Secondary malignant neoplasm of liver and intrahepatic bile duct: Secondary | ICD-10-CM

## 2013-07-12 DIAGNOSIS — C50419 Malignant neoplasm of upper-outer quadrant of unspecified female breast: Secondary | ICD-10-CM

## 2013-07-12 MED ORDER — SODIUM CHLORIDE 0.9 % IV SOLN
INTRAVENOUS | Status: DC
  Administered 2013-07-12: 09:00:00 via INTRAVENOUS

## 2013-07-12 MED ORDER — SODIUM CHLORIDE 0.9 % IJ SOLN
10.0000 mL | INTRAMUSCULAR | Status: DC | PRN
  Administered 2013-07-12: 10 mL via INTRAVENOUS
  Filled 2013-07-12: qty 10

## 2013-07-12 MED ORDER — HEPARIN SOD (PORK) LOCK FLUSH 100 UNIT/ML IV SOLN
500.0000 [IU] | Freq: Once | INTRAVENOUS | Status: AC
  Administered 2013-07-12: 500 [IU] via INTRAVENOUS
  Filled 2013-07-12: qty 5

## 2013-07-12 NOTE — Patient Instructions (Signed)
Dehydration, Adult  Dehydration means your body does not have as much fluid as it needs. Your kidneys, brain, and heart will not work properly without the right amount of fluids and salt.   HOME CARE   Ask your doctor how to replace body fluid losses (rehydrate).   Drink enough fluids to keep your pee (urine) clear or pale yellow.   Drink small amounts of fluids often if you feel sick to your stomach (nauseous) or throw up (vomit).   Eat like you normally do.   Avoid:   Foods or drinks high in sugar.   Bubbly (carbonated) drinks.   Juice.   Very hot or cold fluids.   Drinks with caffeine.   Fatty, greasy foods.   Alcohol.   Tobacco.   Eating too much.   Gelatin desserts.   Wash your hands to avoid spreading germs (bacteria, viruses).   Only take medicine as told by your doctor.   Keep all doctor visits as told.  GET HELP RIGHT AWAY IF:    You cannot drink something without throwing up.   You get worse even with treatment.   Your vomit has blood in it or looks greenish.   Your poop (stool) has blood in it or looks black and tarry.   You have not peed in 6 to 8 hours.   You pee a small amount of very dark pee.   You have a fever.   You pass out (faint).   You have belly (abdominal) pain that gets worse or stays in one spot (localizes).   You have a rash, stiff neck, or bad headache.   You get easily annoyed, sleepy, or are hard to wake up.   You feel weak, dizzy, or very thirsty.  MAKE SURE YOU:    Understand these instructions.   Will watch your condition.   Will get help right away if you are not doing well or get worse.  Document Released: 02/25/2009 Document Revised: 07/24/2011 Document Reviewed: 12/19/2010  Exit[employer] Patient Information 2014 [employer].

## 2013-07-15 ENCOUNTER — Ambulatory Visit (HOSPITAL_COMMUNITY)
Admission: RE | Admit: 2013-07-15 | Discharge: 2013-07-15 | Disposition: A | Discharge status: Home / Self Care | Payer: BC Managed Care – PPO | Source: Ambulatory Visit | Attending: Oncology | Admitting: Oncology

## 2013-07-15 ENCOUNTER — Encounter: Payer: Self-pay | Admitting: Oncology

## 2013-07-15 ENCOUNTER — Encounter (HOSPITAL_COMMUNITY): Payer: Self-pay

## 2013-07-15 ENCOUNTER — Encounter: Payer: Self-pay | Admitting: Nutrition

## 2013-07-15 DIAGNOSIS — I6789 Other cerebrovascular disease: Secondary | ICD-10-CM | POA: Insufficient documentation

## 2013-07-15 DIAGNOSIS — G319 Degenerative disease of nervous system, unspecified: Secondary | ICD-10-CM | POA: Insufficient documentation

## 2013-07-15 DIAGNOSIS — C50411 Malignant neoplasm of upper-outer quadrant of right female breast: Secondary | ICD-10-CM

## 2013-07-15 DIAGNOSIS — K7689 Other specified diseases of liver: Secondary | ICD-10-CM | POA: Insufficient documentation

## 2013-07-15 DIAGNOSIS — C50919 Malignant neoplasm of unspecified site of unspecified female breast: Secondary | ICD-10-CM | POA: Insufficient documentation

## 2013-07-15 DIAGNOSIS — C7800 Secondary malignant neoplasm of unspecified lung: Secondary | ICD-10-CM | POA: Insufficient documentation

## 2013-07-15 DIAGNOSIS — N63 Unspecified lump in unspecified breast: Secondary | ICD-10-CM | POA: Insufficient documentation

## 2013-07-15 DIAGNOSIS — R51 Headache: Secondary | ICD-10-CM | POA: Insufficient documentation

## 2013-07-15 MED ORDER — IOHEXOL 300 MG/ML  SOLN
100.0000 mL | Freq: Once | INTRAMUSCULAR | Status: AC | PRN
  Administered 2013-07-15: 100 mL via INTRAVENOUS

## 2013-07-15 MED ORDER — IOHEXOL 300 MG/ML  SOLN
50.0000 mL | Freq: Once | INTRAMUSCULAR | Status: AC | PRN
  Administered 2013-07-15: 50 mL via ORAL

## 2013-07-15 MED ORDER — GADOBENATE DIMEGLUMINE 529 MG/ML IV SOLN
10.0000 mL | Freq: Once | INTRAVENOUS | Status: AC | PRN
  Administered 2013-07-15: 10 mL via INTRAVENOUS

## 2013-07-15 NOTE — Progress Notes (Signed)
Provided a variety of nutrition supplements for patient to try at family request.  Patient continues to struggle with oral intake.  I have also provided recipes for patient to try to increase calories and protein.

## 2013-07-15 NOTE — Progress Notes (Signed)
[facility], [id number], approved omeprazole 20mg  from 06/24/13-07/15/14

## 2013-07-18 ENCOUNTER — Ambulatory Visit (HOSPITAL_BASED_OUTPATIENT_CLINIC_OR_DEPARTMENT_OTHER): Payer: BC Managed Care – PPO | Admitting: Oncology

## 2013-07-18 ENCOUNTER — Other Ambulatory Visit (HOSPITAL_BASED_OUTPATIENT_CLINIC_OR_DEPARTMENT_OTHER): Payer: BC Managed Care – PPO

## 2013-07-18 ENCOUNTER — Other Ambulatory Visit: Payer: Self-pay | Admitting: *Deleted

## 2013-07-18 VITALS — BP 114/69 | HR 106 | Temp 99.0°F | Resp 18 | Ht 64.0 in | Wt 124.7 lb

## 2013-07-18 DIAGNOSIS — D61810 Antineoplastic chemotherapy induced pancytopenia: Secondary | ICD-10-CM

## 2013-07-18 DIAGNOSIS — C50419 Malignant neoplasm of upper-outer quadrant of unspecified female breast: Secondary | ICD-10-CM

## 2013-07-18 DIAGNOSIS — T451X5A Adverse effect of antineoplastic and immunosuppressive drugs, initial encounter: Secondary | ICD-10-CM

## 2013-07-18 DIAGNOSIS — C50411 Malignant neoplasm of upper-outer quadrant of right female breast: Secondary | ICD-10-CM

## 2013-07-18 DIAGNOSIS — C787 Secondary malignant neoplasm of liver and intrahepatic bile duct: Secondary | ICD-10-CM

## 2013-07-18 DIAGNOSIS — C50919 Malignant neoplasm of unspecified site of unspecified female breast: Secondary | ICD-10-CM

## 2013-07-18 DIAGNOSIS — R1013 Epigastric pain: Secondary | ICD-10-CM

## 2013-07-18 DIAGNOSIS — C7800 Secondary malignant neoplasm of unspecified lung: Secondary | ICD-10-CM

## 2013-07-18 LAB — CBC WITH DIFFERENTIAL/PLATELET
BASO%: 0.5 % (ref 0.0–2.0)
Basophils Absolute: 0.1 10*3/uL (ref 0.0–0.1)
EOS ABS: 0 10*3/uL (ref 0.0–0.5)
EOS%: 0.3 % (ref 0.0–7.0)
HCT: 29.5 % — ABNORMAL LOW (ref 34.8–46.6)
HGB: 9.7 g/dL — ABNORMAL LOW (ref 11.6–15.9)
LYMPH#: 0.7 10*3/uL — AB (ref 0.9–3.3)
LYMPH%: 6 % — ABNORMAL LOW (ref 14.0–49.7)
MCH: 30.3 pg (ref 25.1–34.0)
MCHC: 33 g/dL (ref 31.5–36.0)
MCV: 91.7 fL (ref 79.5–101.0)
MONO#: 1.1 10*3/uL — AB (ref 0.1–0.9)
MONO%: 9.7 % (ref 0.0–14.0)
NEUT%: 83.5 % — ABNORMAL HIGH (ref 38.4–76.8)
NEUTROS ABS: 9.5 10*3/uL — AB (ref 1.5–6.5)
Platelets: 353 10*3/uL (ref 145–400)
RBC: 3.22 10*6/uL — AB (ref 3.70–5.45)
RDW: 20.7 % — ABNORMAL HIGH (ref 11.2–14.5)
WBC: 11.4 10*3/uL — AB (ref 3.9–10.3)

## 2013-07-18 LAB — COMPREHENSIVE METABOLIC PANEL (CC13)
ALBUMIN: 3.4 g/dL — AB (ref 3.5–5.0)
ALT: 19 U/L (ref 0–55)
AST: 25 U/L (ref 5–34)
Alkaline Phosphatase: 109 U/L (ref 40–150)
Anion Gap: 10 mEq/L (ref 3–11)
BUN: 10.7 mg/dL (ref 7.0–26.0)
CALCIUM: 9.6 mg/dL (ref 8.4–10.4)
CHLORIDE: 102 meq/L (ref 98–109)
CO2: 27 meq/L (ref 22–29)
Creatinine: 0.8 mg/dL (ref 0.6–1.1)
Glucose: 94 mg/dl (ref 70–140)
POTASSIUM: 4.5 meq/L (ref 3.5–5.1)
SODIUM: 139 meq/L (ref 136–145)
Total Bilirubin: 0.27 mg/dL (ref 0.20–1.20)
Total Protein: 6.8 g/dL (ref 6.4–8.3)

## 2013-07-18 MED ORDER — CAPECITABINE 500 MG PO TABS: 1000.0000 mg/m2 | ORAL_TABLET | Freq: Two times a day (BID) | ORAL | Status: DC

## 2013-07-18 NOTE — Progress Notes (Signed)
Patient ID: Carrie Berry, female   DOB: 13-Apr-1951, 63 y.o.   MRN: [id number] ID: Carrie Berry OB: [birth year]  MR#: [id number]  CSN#:[id number]  PCP: [name], MD GYN:   SU: [name] OTHER MD: Thea Silversmith, Earle Gell, Christene Slates, Erline Levine  CHIEF COMPLAINT:  Metastatic Breast Cancer   BREAST CANCER HISTORY:  Pasqualina noted a mass in her right breast December of 2013, but did not think much of it. It did grow some lower the summer, but she was keeping her grandchild at that time and was too busy so she did not bring it to Dr. Bernell List attention until August. He set her up for bilateral mammography and ultrasonography at [facility] 12/18/2012 and this measured, on the right, a large irregular lobulated mass in the upper outer quadrant which by ultrasound measured 5.7 cm. The right axilla showed some lymph nodes with thickened cortices. In the left breast there was a focal asymmetry at the depth, lateral to the nipple, and by ultrasound there was a 9 mm ill-defined hypoechoic lesion in this location. Biopsy of the left breast lesion showed only fibrocystic changes.  Biopsy of the right breast mass and right axillary adenopathy ([id number]) on 12/23/2012 showed both to be involved by invasive ductal carcinoma, grade 2, triple negative, with an MIB-1 of 86%.  MRI of the breast 12/27/2012 at [facility] imaging showed in the right breast a mass abutting the pectoralis muscle without enhancement of the muscle measuring 4.8 cm. The satellite nodule measuring approximately 3 mm was also noted superior to the mass and several abnormal and enlarged right axillary lymph nodes were noted, one of which appeared to be necrotic. The largest node measured 2.0 cm. Unfortunately, numerous bilateral pulmonary nodules were also noted.  The patient's subsequent history is as detailed below   INTERVAL HISTORY: [name] returns today accompanied by her husband [name] today for an unscheduled visit. She is  currently day 16 cycle 4 of 4 planned cycles of cyclophosphamide and doxorubicin given in dose dense fashion. She had significant side effects from the treatment, including severe cytopenias, change in bowel habits with abdominal pain and constipation, and febrile neutropenia after cycle 3, leading to dose reduction with cycle 4. Restaging CT scans of the chest and abdomen obtained 07/15/2013 show response at all sites, ranging from 30-60%. Brain MRI the same day showed no evidence of metastatic disease in the brain. The patient is here to consider further treatment options.  REVIEW OF SYSTEMS:   Aleeta continues to have epigastric pain. This is very intermittent. She is on omeprazole. There has been no nausea or vomiting, but she is avoiding eating and drinking because of the discomfort. Bowel movements are entirely normal. She is not exercising at present but her fatigue is a little bit better. A detailed review of systems today was otherwise noncontributory   PAST MEDICAL HISTORY: Past Medical History  Diagnosis Date  . Recurrent sinus infections   . Arthritis   . PONV (postoperative nausea and vomiting)   . Anxiety   . GERD (gastroesophageal reflux disease)   . 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 rt breast ca dx'd 12/2011    breast  . Breast cancer     PAST SURGICAL HISTORY: Past Surgical History  Procedure Laterality Date  . Tubal ligation    . Tubal ligation  1985  . Portacath placement N/A 01/10/2013    Procedure: ULTRASOUND GUIDED PORT-A-CATH INSERTION WITH FLUOROSCOPY;  Surgeon: [name], MD;  Location: [facility] OR;  Service: General;  Laterality: N/A;    FAMILY HISTORY Family History  Problem Relation Age of Onset  . Bladder Cancer Father   . Colon cancer Maternal Grandmother    the patient's parents are living, both in their 45s. The patient's father was diagnosed with bladder cancer the age of 7. The patient's mother's mother had some type of  gastrointestinal cancer. The patient had one brother, no sisters. There is no history of breast or ovarian cancer in the family other than a cousin on the father's side who was diagnosed with breast cancer apparently before the age of 78.  GYNECOLOGIC HISTORY:  Menarche age 62, first live birth age 39, the patient is [place] P2. She went through menopause in 2008. She did not take hormone replacement. She took birth control for approximately 22 years remotely.  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 She's currently on disability. Her husband Rada Maaske [name]"), is vice Development worker, international aid for [employer]. The patient's daughter [name] is a stay-at-home mom in [place], the patient's son Jannete Golob unfortunately died in an automobile accident in December of 2011. The patient has 2 grandchildren. She attends to a local [employer].    ADVANCED DIRECTIVES: Not in place   HEALTH MAINTENANCE: (Updated December 2014) History  Substance Use Topics  . Smoking status: Never Smoker   . Smokeless tobacco: Never Used  . Alcohol Use: No     Colonoscopy: 2005  PAP: Not on file  Bone density: Never  Lipid panel:  Dr. Delfina Redwood   Allergies  Allergen Reactions  . Ondansetron Hcl Other (See Comments)    HEADACHE, patient states she still takes   . Codeine Nausea And Vomiting    "violently ill"  . Penicillins Rash    Childhood reaction -    Current Outpatient Prescriptions  Medication Sig Dispense Refill  . acetaminophen (TYLENOL) 325 MG tablet Take 650 mg by mouth every 8 (eight) hours as needed for pain.      Marland Kitchen aspirin-acetaminophen-caffeine (EXCEDRIN MIGRAINE) 250-250-65 MG per tablet Take 1 tablet by mouth every 6 (six) hours as needed for headache.      Marland Kitchen azithromycin (ZITHROMAX) 1 G powder Take 1 packet (1 g total) by mouth once.  1 each  0  . dexamethasone  (DECADRON) 4 MG tablet Take 2 tablets by mouth once a day on the day after chemotherapy and then take 2 tablets two times a day for 2 days. Take with food.  30 tablet  1  . lidocaine-prilocaine (EMLA) cream Apply topically as needed.  30 g  3  . LORazepam (ATIVAN) 0.5 MG tablet Take 1 tablet (0.5 mg total) by mouth every 8 (eight) hours as needed for anxiety.  30 tablet  0  . omeprazole (PRILOSEC) 20 MG capsule Take 1 capsule (20 mg total) by mouth 2 (two) times daily.  60 capsule  3  . ondansetron (ZOFRAN ODT) 4 MG disintegrating tablet 2mg  ODT q4 hours prn vomiting  4 tablet  0  . ondansetron (ZOFRAN ODT) 8 MG disintegrating tablet Take 1 tablet (8 mg total) by mouth every 8 (eight) hours as needed for nausea or vomiting.  20 tablet  0  . ondansetron (ZOFRAN) 8 MG tablet Take 8 mg by mouth every 8 (eight) hours as needed for nausea or vomiting.      Marland Kitchen PATADAY 0.2 % SOLN Place  1 drop into both eyes daily as needed (dry eyes).       . prochlorperazine (COMPAZINE) 10 MG tablet Take 1 tablet (10 mg total) by mouth every 6 (six) hours as needed for nausea.  30 tablet  2  . promethazine (PHENERGAN) 25 MG tablet       . tobramycin-dexamethasone (TOBRADEX) ophthalmic solution Place 1 drop into both eyes daily.  5 mL  1  . traMADol (ULTRAM) 50 MG tablet Take 1 tablet (50 mg total) by mouth every 6 (six) hours as needed for moderate pain.  60 tablet  0  . traZODone (DESYREL) 50 MG tablet Take 100 mg by mouth. Every other night at bedtime as needed  for sleep      . triamcinolone (NASACORT) 55 MCG/ACT nasal inhaler Place 2 sprays into the nose daily.      Marland Kitchen zolpidem (AMBIEN) 5 MG tablet TAKE 1 TABLET BY MOUTH EVERY DAY AT BEDTIME AS NEEDED FOR SLEEP  20 tablet  0   No current facility-administered medications for this visit.    OBJECTIVE: Middle-aged white woman who appears stated age 72 Vitals:   07/18/13 1301  BP: 114/69  Pulse: 106  Temp: 99 F (37.2 C)  Resp: 18     Body mass index is 21.39  kg/(m^2).    ECOG FS: 1 Filed Weights   07/18/13 1301  Weight: 124 lb 11.2 oz (56.564 kg)     Sclerae unicteric, pupils equal and round Oropharynx clear and moist-- no thrush or other lesions No cervical or supraclavicular adenopathy Lungs no rales or rhonchi, auscultated anterolaterally Heart regular rate and rhythm Abd soft, positive bowel sounds, nontender to moderate palpation MSK no upper extremity lymphedema Neuro: nonfocal, well oriented, stressed affect Breasts: There continues to be a significant mass in the right breast, which is still a mobile, although not freely so. There is evidence of skin involvement, which has receded, currently no erythema, more a dusky area on the skin where there is also an indentation. I do not palpate any right axillary lymph nodes. The left breast is unremarkable  LAB RESULTS:   Lab Results  Component Value Date   WBC 11.4* 07/18/2013   NEUTROABS 9.5* 07/18/2013   HGB 9.7* 07/18/2013   HCT 29.5* 07/18/2013   MCV 91.7 07/18/2013   PLT 353 07/18/2013      Chemistry      Component Value Date/Time   NA 139 07/18/2013 1230   NA 135 07/01/2013 1022   K 4.5 07/18/2013 1230   K 4.1 07/01/2013 1022   CL 98 07/01/2013 1022   CO2 27 07/18/2013 1230   CO2 27 07/01/2013 1022   BUN 10.7 07/18/2013 1230   BUN 11 07/01/2013 1022   CREATININE 0.8 07/18/2013 1230   CREATININE 0.65 07/01/2013 1022      Component Value Date/Time   CALCIUM 9.6 07/18/2013 1230   CALCIUM 8.8 07/01/2013 1022   ALKPHOS 109 07/18/2013 1230   ALKPHOS 117 06/24/2013 2245   AST 25 07/18/2013 1230   AST 22 06/24/2013 2245   ALT 19 07/18/2013 1230   ALT 28 06/24/2013 2245   BILITOT 0.27 07/18/2013 1230   BILITOT 0.3 06/24/2013 2245        STUDIES: Dg Chest 2 View  06/24/2013   CLINICAL DATA:  Fever.  Cough for 1 day.  EXAM: CHEST  2 VIEW  COMPARISON:  DG ABD ACUTE W/CHEST dated 06/23/2013; CT CHEST W/CM dated 03/17/2013; CT CHEST W/CM dated 05/16/2013  FINDINGS: Pulmonary nodules demonstrated on prior CT are  difficult to visualize radiographically. Right IJ Port-A-Cath appears unchanged. Chronic bronchitic changes at the bases.  No airspace disease or pleural effusion identified. No interval change. Cardiopericardial silhouette is within normal limits.  IMPRESSION: No interval change or acute abnormality. Pulmonary nodules demonstrated on prior chest CT compatible with metastatic disease are difficult to resolve radiographically.   Electronically Signed   By: Dereck Ligas M.D.   On: 06/24/2013 23:03   Ct Chest W Contrast  07/15/2013   CLINICAL DATA:  Breast cancer  EXAM: CT CHEST AND ABDOMEN WITH CONTRAST  TECHNIQUE: Multidetector CT imaging of the chest and abdomen was performed following the standard protocol during bolus administration of intravenous contrast.  CONTRAST:  132mL OMNIPAQUE IOHEXOL 300 MG/ML  SOLN  COMPARISON:  05/16/2013  FINDINGS: CT CHEST FINDINGS  The heart size appears normal. There is no pericardial effusion. Stable 5 mm pre-vascular lymph node, image 22/series 2. The right paratracheal lymph node measures 5 mm and is unchanged from previous exam. No enlarged mediastinal or hilar lymph nodes identified.  There is no pleural effusion identified. Pulmonary nodules are again identified. Right upper lobe nodule measures 5 mm, image 19/ series 5. Previously 9 mm. Index subpleural nodule within the left upper lobe measures 5 mm, image 21/series 5. This is compared with 8 mm previously. Index nodule within the superior segment of right lower lobe measures 3 mm, image 29/ series 5. Previously 6 mm. No new or progressive pulmonary nodularity.  There is a mass within the right breast measuring 5.7 x 3.9 cm, image 33/series 2. This is improved from 7.8 x 5.6 cm previously. No axillary or supraclavicular adenopathy identified.  Review of the visualized bony structures shows no aggressive lytic or sclerotic bone lesions.  CT ABDOMEN FINDINGS  Multi focal low-attenuation lesions within the liver parenchyma  are identified. Index lesion within the right hepatic lobe posteriorly measures 9 mm, image 57/ series 2. Previously 2.1 cm. Medial segment of left hepatic lobe lesion measures 6 mm, image 63/series 2. Previously 1.7 cm. No new or progressive liver metastasis. The gallbladder appears normal. No biliary dilatation. Normal appearance of the pancreas. The spleen is on unremarkable.  The adrenal glands are both normal. Normal appearance of both kidneys.  The abdominal aorta appears within normal limits. There is no upper abdominal adenopathy identified.  The stomach is normal. The small bowel loops have a normal course and caliber the visualized portions of the colon are on unremarkable.  Review of the visualized bony structures demonstrates no aggressive lytic or sclerotic bone lesions.  IMPRESSION: 1. Interval response to therapy. 2. Decrease in size of right breast mass, pulmonary metastasis and liver lesions.   Electronically Signed   By: Kerby Moors M.D.   On: 07/15/2013 15:28   Ct Abdomen W Contrast  07/15/2013   CLINICAL DATA:  Breast cancer  EXAM: CT CHEST AND ABDOMEN WITH CONTRAST  TECHNIQUE: Multidetector CT imaging of the chest and abdomen was performed following the standard protocol during bolus administration of intravenous contrast.  CONTRAST:  182mL OMNIPAQUE IOHEXOL 300 MG/ML  SOLN  COMPARISON:  05/16/2013  FINDINGS: CT CHEST FINDINGS  The heart size appears normal. There is no pericardial effusion. Stable 5 mm pre-vascular lymph node, image 22/series 2. The right paratracheal lymph node measures 5 mm and is unchanged from previous exam. No enlarged mediastinal or hilar lymph nodes identified.  There is no pleural effusion identified. Pulmonary nodules are again identified.  Right upper lobe nodule measures 5 mm, image 19/ series 5. Previously 9 mm. Index subpleural nodule within the left upper lobe measures 5 mm, image 21/series 5. This is compared with 8 mm previously. Index nodule within the  superior segment of right lower lobe measures 3 mm, image 29/ series 5. Previously 6 mm. No new or progressive pulmonary nodularity.  There is a mass within the right breast measuring 5.7 x 3.9 cm, image 33/series 2. This is improved from 7.8 x 5.6 cm previously. No axillary or supraclavicular adenopathy identified.  Review of the visualized bony structures shows no aggressive lytic or sclerotic bone lesions.  CT ABDOMEN FINDINGS  Multi focal low-attenuation lesions within the liver parenchyma are identified. Index lesion within the right hepatic lobe posteriorly measures 9 mm, image 57/ series 2. Previously 2.1 cm. Medial segment of left hepatic lobe lesion measures 6 mm, image 63/series 2. Previously 1.7 cm. No new or progressive liver metastasis. The gallbladder appears normal. No biliary dilatation. Normal appearance of the pancreas. The spleen is on unremarkable.  The adrenal glands are both normal. Normal appearance of both kidneys.  The abdominal aorta appears within normal limits. There is no upper abdominal adenopathy identified.  The stomach is normal. The small bowel loops have a normal course and caliber the visualized portions of the colon are on unremarkable.  Review of the visualized bony structures demonstrates no aggressive lytic or sclerotic bone lesions.  IMPRESSION: 1. Interval response to therapy. 2. Decrease in size of right breast mass, pulmonary metastasis and liver lesions.   Electronically Signed   By: Kerby Moors M.D.   On: 07/15/2013 15:28   Mr Jeri Cos [id number] Contrast  07/15/2013   CLINICAL DATA:  Stage IV breast cancer.  Headaches.  EXAM: MRI HEAD WITHOUT AND WITH CONTRAST  TECHNIQUE: Multiplanar, multiecho pulse sequences of the brain and surrounding structures were obtained without and with intravenous contrast.  CONTRAST:  59mL MULTIHANCE GADOBENATE DIMEGLUMINE 529 MG/ML IV SOLN  COMPARISON:  None.  FINDINGS: No acute stroke or hemorrhage. No mass lesion or hydrocephalus. No  extra-axial fluid.  Mild cerebral and cerebellar atrophy. Mild subcortical and periventricular T2 and FLAIR hyperintensities, likely chronic microvascular ischemic change. Flow voids are maintained throughout the carotid, basilar, and vertebral arteries. There are no areas of chronic hemorrhage. Pituitary, pineal, and cerebellar tonsils unremarkable. No upper cervical lesions.  Post infusion, there is no abnormal enhancement of the brain or meninges. Visualized calvarium, skull base, and upper cervical osseous structures unremarkable. Scalp and extracranial soft tissues, orbits, sinuses, and mastoids show no acute process.  IMPRESSION: Mild atrophy and small vessel disease. No acute intracranial findings.   Electronically Signed   By: Rolla Flatten M.D.   On: 07/15/2013 16:31   Dg Abd Acute W/chest  06/23/2013   CLINICAL DATA:  Umbilical pain. History of breast cancer with pulmonary metastases.  EXAM: ACUTE ABDOMEN SERIES (ABDOMEN 2 VIEW & CHEST 1 VIEW)  COMPARISON:  CT chest 05/16/2013  FINDINGS: Port-A-Cath remains in place. Pulmonary nodules are noted but better demonstrated on CT scan. The lungs are otherwise clear. Heart size is normal. No pneumothorax or pleural fluid.  Two views of the abdomen show no free intraperitoneal air. The bowel gas pattern is normal.  IMPRESSION: No acute finding chest or abdomen. Pulmonary nodules consistent with metastatic breast cancer are noted as seen on prior CT.   Electronically Signed   By: Inge Rise M.D.   On: 06/23/2013 20:49   ASSESSMENT:  63 y.o. [name], [place] woman status post right breast upper outer quadrant and right axillary lymph node biopsy 12/23/2012 for a clinical T3 N1 M1, stage IV invasive ductal carcinoma, grade 3, triple negative, with an MIB-1 of 86%.  (1) right liver lobe biopsy 01/21/2013 confirms metastatic adenocarcinoma; scans show involvement of the liver, lungs, and likely bone.  (2) enrolled in [facility] study [id number] (docetaxel + oral gamma  secretase inibitor [id number]), received one cycle starting 02/04/2013  but withdrew because of poor tolerance despite treatment interruptions and decreased dosing of the oral component  (3)  chest CT in early November 2014 was compared with studies in August 2014 and showed interval progression of metastatic breast cancer, with an enlarging primary right breast mass, enlarging right axillary lymph nodes, enlarging pulmonary nodules and new/enlarging hepatic metastases.  (4) Abraxane started 03/19/2013, given day 1 and day 8 of each 21 day cycle, stopped with the day 1 cycle 3 dose (04/29/2013) because of local progression of disease  (5) cyclophosphamide and doxorubicin started 05/20/2013, given in dose dense fashion  with Neulasta support on day 2. Completed 4 cycles 07/03/2013, with the final cycle dose reduced 15% because of an episode of febrile neutropenia after cycle 3. Restaging studies 07/15/2013 showed partial response.  (6) to start capecitabine 1.5 g by mouth twice a day 7 days on 7 days off in April  (7) to be evaluated by GI for possible peptic ulcer   PLAN: We spent approximately 45 minutes today going over Nelia's situation. She has "beaten back" her cancer and early herself some time. She pay for this with significant toxicities and side effects. Certainly we could continue Cytoxan and Adriamycin. Likely it would be safe for her to receive an additional 2 cycles. After that we would be pushing the limits of Adriamycin toxicity.  Other options at this point include observation alone; participation in a clinical trial plus or minus second opinion again at [facility]; proceeding to right mastectomy to prevent future local symptoms; or "maintenance" chemotherapy with a less aggressive agent, most likely capecitabine.  Malka does not want to return to [facility], at least not at this point. [name] is interested in researching available research studies and I have explained to him how to get into the [place] to accomplish that most efficiently. I am concerned that if we start observation we are going to rapidly lose the ground she has worked so hard to gain. Given how hard it was for her to get through her 4 cycles of site off ifosfamide and doxorubicin, and a very limited amount of doxorubicin exposure that we have left, I would not favor returning to that.  Accordingly we are going to consider capecitabine. I am writing her prescription for her but I would prefer she not started until April. This will give her a little time to recover and perhaps just as importantly time for what I think is either severity severe reflux or actual ulceration of her GE junction to be evaluated. I am referring her to Dr. Earle Gell, who has evaluated her in the past, for likely EGD. In the meantime I have asked her to take Mylanta whenever she has the significant pain that she can develop even though she is already on omeprazole.  Qadira has a good understanding of this plan. She agrees with it. She knows a goal of treatment in her case is control. She will call with any problems that may develop before her return visit here in about  3 weeks.    Chauncey Cruel, MD   07/18/2013 1:44 PM

## 2013-07-18 NOTE — Addendum Note (Signed)
Addended by: Chauncey Cruel on: 07/18/2013 01:54 PM   Modules accepted: Orders, Medications

## 2013-07-21 NOTE — Progress Notes (Signed)
Rx for Xeloda faxed with demographicand insurance info to [facility] at [phone]

## 2013-07-22 ENCOUNTER — Other Ambulatory Visit: Payer: Self-pay | Admitting: Hematology and Oncology

## 2013-07-22 ENCOUNTER — Telehealth: Payer: Self-pay | Admitting: Oncology

## 2013-07-22 ENCOUNTER — Telehealth: Payer: Self-pay | Admitting: *Deleted

## 2013-07-22 NOTE — Telephone Encounter (Signed)
Patient Rx was faxed to [facility], however, it will need to come from [facility]. Patient notified of changes.

## 2013-07-22 NOTE — Telephone Encounter (Signed)
, °

## 2013-07-24 ENCOUNTER — Telehealth: Payer: Self-pay | Admitting: *Deleted

## 2013-07-24 ENCOUNTER — Encounter: Payer: Self-pay | Admitting: *Deleted

## 2013-07-24 NOTE — Telephone Encounter (Signed)
Referral sent to Dr Earle Gell. Also faxed extension letter for sicktime to [facility].

## 2013-07-29 ENCOUNTER — Telehealth: Payer: Self-pay | Admitting: Oncology

## 2013-07-29 ENCOUNTER — Telehealth: Payer: Self-pay | Admitting: *Deleted

## 2013-07-29 ENCOUNTER — Other Ambulatory Visit: Payer: Self-pay | Admitting: *Deleted

## 2013-07-29 ENCOUNTER — Ambulatory Visit (HOSPITAL_BASED_OUTPATIENT_CLINIC_OR_DEPARTMENT_OTHER): Payer: BC Managed Care – PPO

## 2013-07-29 ENCOUNTER — Other Ambulatory Visit (HOSPITAL_BASED_OUTPATIENT_CLINIC_OR_DEPARTMENT_OTHER): Payer: BC Managed Care – PPO

## 2013-07-29 VITALS — BP 109/67 | HR 89 | Temp 99.2°F | Resp 18

## 2013-07-29 DIAGNOSIS — Z5189 Encounter for other specified aftercare: Secondary | ICD-10-CM

## 2013-07-29 DIAGNOSIS — E860 Dehydration: Secondary | ICD-10-CM

## 2013-07-29 DIAGNOSIS — C50411 Malignant neoplasm of upper-outer quadrant of right female breast: Secondary | ICD-10-CM

## 2013-07-29 DIAGNOSIS — C7952 Secondary malignant neoplasm of bone marrow: Secondary | ICD-10-CM

## 2013-07-29 DIAGNOSIS — Z95828 Presence of other vascular implants and grafts: Secondary | ICD-10-CM

## 2013-07-29 DIAGNOSIS — C787 Secondary malignant neoplasm of liver and intrahepatic bile duct: Secondary | ICD-10-CM

## 2013-07-29 DIAGNOSIS — C50419 Malignant neoplasm of upper-outer quadrant of unspecified female breast: Secondary | ICD-10-CM

## 2013-07-29 DIAGNOSIS — C7951 Secondary malignant neoplasm of bone: Secondary | ICD-10-CM

## 2013-07-29 LAB — CBC WITH DIFFERENTIAL/PLATELET
BASO%: 1.1 % (ref 0.0–2.0)
Basophils Absolute: 0.1 10*3/uL (ref 0.0–0.1)
EOS%: 1.5 % (ref 0.0–7.0)
Eosinophils Absolute: 0.2 10*3/uL (ref 0.0–0.5)
HEMATOCRIT: 31.7 % — AB (ref 34.8–46.6)
HGB: 10.7 g/dL — ABNORMAL LOW (ref 11.6–15.9)
LYMPH%: 10.8 % — ABNORMAL LOW (ref 14.0–49.7)
MCH: 31.5 pg (ref 25.1–34.0)
MCHC: 33.8 g/dL (ref 31.5–36.0)
MCV: 93.1 fL (ref 79.5–101.0)
MONO#: 1.2 10*3/uL — ABNORMAL HIGH (ref 0.1–0.9)
MONO%: 10.6 % (ref 0.0–14.0)
NEUT#: 8.3 10*3/uL — ABNORMAL HIGH (ref 1.5–6.5)
NEUT%: 76 % (ref 38.4–76.8)
Platelets: 361 10*3/uL (ref 145–400)
RBC: 3.4 10*6/uL — ABNORMAL LOW (ref 3.70–5.45)
RDW: 20.2 % — ABNORMAL HIGH (ref 11.2–14.5)
WBC: 10.9 10*3/uL — ABNORMAL HIGH (ref 3.9–10.3)
lymph#: 1.2 10*3/uL (ref 0.9–3.3)

## 2013-07-29 LAB — MAGNESIUM (CC13): Magnesium: 2.3 mg/dl (ref 1.5–2.5)

## 2013-07-29 MED ORDER — SODIUM CHLORIDE 0.9 % IJ SOLN
10.0000 mL | INTRAMUSCULAR | Status: DC | PRN
  Administered 2013-07-29: 10 mL via INTRAVENOUS
  Filled 2013-07-29: qty 10

## 2013-07-29 MED ORDER — SODIUM CHLORIDE 0.9 % IV SOLN
Freq: Once | INTRAVENOUS | Status: AC
  Administered 2013-07-29: 14:00:00 via INTRAVENOUS

## 2013-07-29 MED ORDER — HEPARIN SOD (PORK) LOCK FLUSH 100 UNIT/ML IV SOLN
500.0000 [IU] | Freq: Once | INTRAVENOUS | Status: AC
  Administered 2013-07-29: 500 [IU] via INTRAVENOUS
  Filled 2013-07-29: qty 5

## 2013-07-29 NOTE — Telephone Encounter (Signed)
added inf appt for 3/18 @ 1pm - d/t/location pt aware per charge nurse. also per 3/16 pof IVF's 3/18 post f/u and 3/19. pt to will get new schedule 3/18.

## 2013-07-29 NOTE — Patient Instructions (Signed)
Dehydration, Adult Dehydration is when you lose more fluids from the body than you take in. Vital organs like the kidneys, brain, and heart cannot function without a proper amount of fluids and salt. Any loss of fluids from the body can cause dehydration.  CAUSES   Vomiting.  Diarrhea.  Excessive sweating.  Excessive urine output.  Fever. SYMPTOMS  Mild dehydration  Thirst.  Dry lips.  Slightly dry mouth. Moderate dehydration  Very dry mouth.  Sunken eyes.  Skin does not bounce back quickly when lightly pinched and released.  Dark urine and decreased urine production.  Decreased tear production.  Headache. Severe dehydration  Very dry mouth.  Extreme thirst.  Rapid, weak pulse (more than 100 beats per minute at rest).  Cold hands and feet.  Not able to sweat in spite of heat and temperature.  Rapid breathing.  Blue lips.  Confusion and lethargy.  Difficulty being awakened.  Minimal urine production.  No tears. DIAGNOSIS  Your caregiver will diagnose dehydration based on your symptoms and your exam. Blood and urine tests will help confirm the diagnosis. The diagnostic evaluation should also identify the cause of dehydration. TREATMENT  Treatment of mild or moderate dehydration can often be done at home by increasing the amount of fluids that you drink. It is best to drink small amounts of fluid more often. Drinking too much at one time can make vomiting worse. Refer to the home care instructions below. Severe dehydration needs to be treated at the hospital where you will probably be given intravenous (IV) fluids that contain water and electrolytes. HOME CARE INSTRUCTIONS   Ask your caregiver about specific rehydration instructions.  Drink enough fluids to keep your urine clear or pale yellow.  Drink small amounts frequently if you have nausea and vomiting.  Eat as you normally do.  Avoid:  Foods or drinks high in sugar.  Carbonated  drinks.  Juice.  Extremely hot or cold fluids.  Drinks with caffeine.  Fatty, greasy foods.  Alcohol.  Tobacco.  Overeating.  Gelatin desserts.  Wash your hands well to avoid spreading bacteria and viruses.  Only take over-the-counter or prescription medicines for pain, discomfort, or fever as directed by your caregiver.  Ask your caregiver if you should continue all prescribed and over-the-counter medicines.  Keep all follow-up appointments with your caregiver. SEEK MEDICAL CARE IF:  You have abdominal pain and it increases or stays in one area (localizes).  You have a rash, stiff neck, or severe headache.  You are irritable, sleepy, or difficult to awaken.  You are weak, dizzy, or extremely thirsty. SEEK IMMEDIATE MEDICAL CARE IF:   You are unable to keep fluids down or you get worse despite treatment.  You have frequent episodes of vomiting or diarrhea.  You have blood or green matter (bile) in your vomit.  You have blood in your stool or your stool looks black and tarry.  You have not urinated in 6 to 8 hours, or you have only urinated a small amount of very dark urine.  You have a fever.  You faint. MAKE SURE YOU:   Understand these instructions.  Will watch your condition.  Will get help right away if you are not doing well or get worse. Document Released: 05/01/2005 Document Revised: 07/24/2011 Document Reviewed: 12/19/2010 Exit[employer] Patient Information 2014 [employer].  

## 2013-07-29 NOTE — Telephone Encounter (Signed)
Pt called to cancel her appts for 08/18/13 and to rs for 08/26/13 w/ labs@ 3:30pm and ov@ 4pm. Pt is aware...td

## 2013-07-30 ENCOUNTER — Ambulatory Visit (HOSPITAL_BASED_OUTPATIENT_CLINIC_OR_DEPARTMENT_OTHER): Payer: BC Managed Care – PPO

## 2013-07-30 ENCOUNTER — Encounter: Payer: Self-pay | Admitting: Hematology and Oncology

## 2013-07-30 ENCOUNTER — Other Ambulatory Visit: Payer: Self-pay | Admitting: *Deleted

## 2013-07-30 ENCOUNTER — Ambulatory Visit (HOSPITAL_BASED_OUTPATIENT_CLINIC_OR_DEPARTMENT_OTHER): Payer: BC Managed Care – PPO | Admitting: Hematology and Oncology

## 2013-07-30 ENCOUNTER — Other Ambulatory Visit: Payer: Self-pay

## 2013-07-30 ENCOUNTER — Other Ambulatory Visit: Payer: Self-pay | Admitting: Hematology and Oncology

## 2013-07-30 VITALS — BP 103/64 | HR 84 | Temp 99.2°F | Resp 18 | Ht 64.0 in | Wt 125.2 lb

## 2013-07-30 DIAGNOSIS — R509 Fever, unspecified: Secondary | ICD-10-CM

## 2013-07-30 DIAGNOSIS — C787 Secondary malignant neoplasm of liver and intrahepatic bile duct: Secondary | ICD-10-CM

## 2013-07-30 DIAGNOSIS — J329 Chronic sinusitis, unspecified: Secondary | ICD-10-CM

## 2013-07-30 DIAGNOSIS — C50919 Malignant neoplasm of unspecified site of unspecified female breast: Secondary | ICD-10-CM

## 2013-07-30 DIAGNOSIS — C7951 Secondary malignant neoplasm of bone: Secondary | ICD-10-CM

## 2013-07-30 DIAGNOSIS — C50419 Malignant neoplasm of upper-outer quadrant of unspecified female breast: Secondary | ICD-10-CM

## 2013-07-30 DIAGNOSIS — R531 Weakness: Secondary | ICD-10-CM

## 2013-07-30 DIAGNOSIS — R51 Headache: Secondary | ICD-10-CM

## 2013-07-30 DIAGNOSIS — R110 Nausea: Secondary | ICD-10-CM

## 2013-07-30 DIAGNOSIS — Z5189 Encounter for other specified aftercare: Secondary | ICD-10-CM

## 2013-07-30 DIAGNOSIS — R519 Headache, unspecified: Secondary | ICD-10-CM

## 2013-07-30 DIAGNOSIS — E860 Dehydration: Secondary | ICD-10-CM

## 2013-07-30 DIAGNOSIS — R197 Diarrhea, unspecified: Secondary | ICD-10-CM

## 2013-07-30 DIAGNOSIS — C7952 Secondary malignant neoplasm of bone marrow: Secondary | ICD-10-CM

## 2013-07-30 LAB — COMPREHENSIVE METABOLIC PANEL (CC13)
ALK PHOS: 79 U/L (ref 40–150)
ALT: 22 U/L (ref 0–55)
AST: 27 U/L (ref 5–34)
Albumin: 3.5 g/dL (ref 3.5–5.0)
Anion Gap: 9 mEq/L (ref 3–11)
BUN: 9.4 mg/dL (ref 7.0–26.0)
CO2: 26 mEq/L (ref 22–29)
Calcium: 9 mg/dL (ref 8.4–10.4)
Chloride: 103 mEq/L (ref 98–109)
Creatinine: 0.7 mg/dL (ref 0.6–1.1)
Glucose: 107 mg/dl (ref 70–140)
Potassium: 4 mEq/L (ref 3.5–5.1)
SODIUM: 138 meq/L (ref 136–145)
Total Bilirubin: 0.28 mg/dL (ref 0.20–1.20)
Total Protein: 6.4 g/dL (ref 6.4–8.3)

## 2013-07-30 LAB — CBC WITH DIFFERENTIAL/PLATELET
BASO%: 0.9 % (ref 0.0–2.0)
Basophils Absolute: 0.1 10*3/uL (ref 0.0–0.1)
EOS ABS: 0.2 10*3/uL (ref 0.0–0.5)
EOS%: 2.4 % (ref 0.0–7.0)
HCT: 30.6 % — ABNORMAL LOW (ref 34.8–46.6)
HGB: 9.9 g/dL — ABNORMAL LOW (ref 11.6–15.9)
LYMPH%: 9.6 % — ABNORMAL LOW (ref 14.0–49.7)
MCH: 30.7 pg (ref 25.1–34.0)
MCHC: 32.4 g/dL (ref 31.5–36.0)
MCV: 95 fL (ref 79.5–101.0)
MONO#: 1 10*3/uL — ABNORMAL HIGH (ref 0.1–0.9)
MONO%: 9.7 % (ref 0.0–14.0)
NEUT%: 77.4 % — ABNORMAL HIGH (ref 38.4–76.8)
NEUTROS ABS: 7.7 10*3/uL — AB (ref 1.5–6.5)
NRBC: 0 % (ref 0–0)
Platelets: 293 10*3/uL (ref 145–400)
RBC: 3.22 10*6/uL — AB (ref 3.70–5.45)
RDW: 18.3 % — ABNORMAL HIGH (ref 11.2–14.5)
WBC: 10 10*3/uL (ref 3.9–10.3)
lymph#: 1 10*3/uL (ref 0.9–3.3)

## 2013-07-30 MED ORDER — SODIUM CHLORIDE 0.9 % IJ SOLN
10.0000 mL | INTRAMUSCULAR | Status: DC | PRN
  Administered 2013-07-30: 10 mL via INTRAVENOUS
  Filled 2013-07-30: qty 10

## 2013-07-30 MED ORDER — SODIUM CHLORIDE 0.9 % IV SOLN
INTRAVENOUS | Status: AC
  Administered 2013-07-30: 14:00:00 via INTRAVENOUS

## 2013-07-30 MED ORDER — CLARITHROMYCIN 500 MG PO TABS: 500.0000 mg | ORAL_TABLET | Freq: Two times a day (BID) | ORAL | Status: DC

## 2013-07-30 MED ORDER — LORAZEPAM 0.5 MG PO TABS: 0.5000 mg | ORAL_TABLET | Freq: Three times a day (TID) | ORAL | Status: DC | PRN

## 2013-07-30 MED ORDER — HEPARIN SOD (PORK) LOCK FLUSH 100 UNIT/ML IV SOLN
500.0000 [IU] | Freq: Once | INTRAVENOUS | Status: AC
  Administered 2013-07-30: 500 [IU] via INTRAVENOUS
  Filled 2013-07-30: qty 5

## 2013-07-30 NOTE — Progress Notes (Signed)
. Patient ID: Carrie Berry, female   DOB: 04/06/1951, 63 y.o.   MRN: [id number] ID: Carrie Berry OB: 1951-03-18  MR#: [id number]  CSN#:[id number]  PCP: [name], MD GYN:   SU: [name] OTHER MD: Thea Silversmith, Earle Gell, Christene Slates, Erline Levine  CHIEF COMPLAINT:  Metastatic Breast Cancer   BREAST CANCER HISTORY:  Carrie Berry noted a mass in her right breast December of 2013, but did not think much of it. It did grow some lower the summer, but she was keeping her grandchild at that time and was too busy so she did not bring it to Dr. Bernell List attention until August. He set her up for bilateral mammography and ultrasonography at [facility] 12/18/2012 and this measured, on the right, a large irregular lobulated mass in the upper outer quadrant which by ultrasound measured 5.7 cm. The right axilla showed some lymph nodes with thickened cortices. In the left breast there was a focal asymmetry at the depth, lateral to the nipple, and by ultrasound there was a 9 mm ill-defined hypoechoic lesion in this location. Biopsy of the left breast lesion showed only fibrocystic changes.  Biopsy of the right breast mass and right axillary adenopathy ([id number]) on 12/23/2012 showed both to be involved by invasive ductal carcinoma, grade 2, triple negative, with an MIB-1 of 86%.  MRI of the breast 12/27/2012 at [facility] showed in the right breast a mass abutting the pectoralis muscle without enhancement of the muscle measuring 4.8 cm. The satellite nodule measuring approximately 3 mm was also noted superior to the mass and several abnormal and enlarged right axillary lymph nodes were noted, one of which appeared to be necrotic. The largest node measured 2.0 cm. Unfortunately, numerous bilateral pulmonary nodules were also noted.  The patient's subsequent history is as detailed below   INTERVAL HISTORY: Carrie Berry returns today accompanied by her sister in law.She  has currently  completed cycle 4  of 4 planned cycles of cyclophosphamide and doxorubicin given in dose dense fashion. She had significant side effects from the treatment, including severe cytopenias, change in bowel habits with abdominal pain and constipation, and febrile neutropenia after cycle 3, leading to dose reduction with cycle 4. Restaging CT scans of the chest and abdomen obtained 07/15/2013 show response at all sites, ranging from 30-60%. Brain MRI the same day showed no evidence of metastatic disease in the brain.  The patient is here today due to ongoing problems with  nausea decreased oral intake and not feeling well.She denies fever or vomiting..She reports increased abdominal discomfort over the past month.Previously constipated now 2 loose BM's.denies mucous or bloody stools.Denies problems with urination. She continues on Omeprazole without significant improvement.  REVIEW OF SYSTEMS She reports rather severe headache over the past month.She was treated with Z pack for sinusitis last month.she had MRI brain for restaging that was negative for metastatic to brain disease.She reports some nasal congestion.She denies cough. :    A detailed review of systems today was otherwise noncontributory   PAST MEDICAL HISTORY: Past Medical History  Diagnosis Date  . Recurrent sinus infections   . Arthritis   . PONV (postoperative nausea and vomiting)   . Anxiety   . GERD (gastroesophageal reflux disease)   . 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 rt breast ca dx'd 12/2011    breast  . Breast cancer     PAST SURGICAL HISTORY: Past Surgical History  Procedure Laterality Date  . Tubal ligation    .  Tubal ligation  1985  . Portacath placement N/A 01/10/2013    Procedure: ULTRASOUND GUIDED PORT-A-CATH INSERTION WITH FLUOROSCOPY;  Surgeon: [name], MD;  Location: [facility];  Service: General;  Laterality: N/A;    FAMILY HISTORY Family History  Problem Relation Age of Onset  . Bladder  Cancer Father   . Colon cancer Maternal Grandmother    the patient's parents are living, both in their 55s. The patient's father was diagnosed with bladder cancer the age of 66. The patient's mother's mother had some type of gastrointestinal cancer. The patient had one brother, no sisters. There is no history of breast or ovarian cancer in the family other than a cousin on the father's side who was diagnosed with breast cancer apparently before the age of 39.  GYNECOLOGIC HISTORY:  Menarche age 74, first live birth age 89, the patient is Carrie Berry P2. She went through menopause in 2008. She did not take hormone replacement. She took birth control for approximately 22 years remotely.  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 She's currently on disability. Her husband Carrie Berry"), is vice Development worker, international aid for [employer]. The patient's daughter Carrie Berry is a stay-at-home mom in [place], the patient's son Carrie Berry unfortunately died in an automobile accident in December of 2011. The patient has 2 grandchildren. She attends to a local [employer].    ADVANCED DIRECTIVES: Not in place   HEALTH MAINTENANCE: (Updated December 2014) History  Substance Use Topics  . Smoking status: Never Smoker   . Smokeless tobacco: Never Used  . Alcohol Use: No     Colonoscopy: 2005  PAP: Not on file  Bone density: Never  Lipid panel:  Dr. Delfina Redwood   Allergies  Allergen Reactions  . Ondansetron Hcl Other (See Comments)    HEADACHE, patient states she still takes   . Codeine Nausea And Vomiting    "violently ill"  . Penicillins Rash    Childhood reaction -    Current Outpatient Prescriptions  Medication Sig Dispense Refill  . acetaminophen (TYLENOL) 325 MG tablet Take 650 mg by mouth every 8 (eight) hours as needed for pain.      Marland Kitchen aspirin-acetaminophen-caffeine  (EXCEDRIN MIGRAINE) 250-250-65 MG per tablet Take 1 tablet by mouth every 6 (six) hours as needed for headache.      . lidocaine-prilocaine (EMLA) cream Apply topically as needed.  30 g  3  . LORazepam (ATIVAN) 0.5 MG tablet Take 1 tablet (0.5 mg total) by mouth every 8 (eight) hours as needed for anxiety.  30 tablet  0  . omeprazole (PRILOSEC) 20 MG capsule Take 1 capsule (20 mg total) by mouth 2 (two) times daily.  60 capsule  3  . ondansetron (ZOFRAN) 8 MG tablet Take 8 mg by mouth every 8 (eight) hours as needed for nausea or vomiting.      Marland Kitchen PATADAY 0.2 % SOLN Place 1 drop into both eyes daily as needed (dry eyes).       . prochlorperazine (COMPAZINE) 10 MG tablet Take 1 tablet (10 mg total) by mouth every 6 (six) hours as needed for nausea.  30 tablet  2  . promethazine (PHENERGAN) 25 MG tablet       . tobramycin-dexamethasone (TOBRADEX) ophthalmic solution Place 1 drop into both eyes daily.  5 mL  1  . traMADol (ULTRAM) 50 MG tablet Take 1 tablet (50 mg total)  by mouth every 6 (six) hours as needed for moderate pain.  60 tablet  0  . traZODone (DESYREL) 50 MG tablet Take 100 mg by mouth. Every other night at bedtime as needed  for sleep      . triamcinolone (NASACORT) 55 MCG/ACT nasal inhaler Place 2 sprays into the nose daily.      Marland Kitchen zolpidem (AMBIEN) 5 MG tablet TAKE 1 TABLET BY MOUTH EVERY DAY AT BEDTIME AS NEEDED FOR SLEEP  20 tablet  0  . azithromycin (ZITHROMAX) 1 G powder Take 1 packet (1 g total) by mouth once.  1 each  0  . capecitabine (XELODA) 500 MG tablet Take 3 tablets (1,500 mg total) by mouth 2 (two) times daily after a meal.  84 tablet  4  . clarithromycin (BIAXIN) 500 MG tablet Take 1 tablet (500 mg total) by mouth 2 (two) times daily.  28 tablet  0  . dexamethasone (DECADRON) 4 MG tablet Take 2 tablets by mouth once a day on the day after chemotherapy and then take 2 tablets two times a day for 2 days. Take with food.  30 tablet  1  . ondansetron (ZOFRAN ODT) 4 MG  disintegrating tablet 2mg  ODT q4 hours prn vomiting  4 tablet  0  . ondansetron (ZOFRAN ODT) 8 MG disintegrating tablet Take 1 tablet (8 mg total) by mouth every 8 (eight) hours as needed for nausea or vomiting.  20 tablet  0   Current Facility-Administered Medications  Medication Dose Route Frequency Provider Last Rate Last Dose  . 0.9 %  sodium chloride infusion   Intravenous Continuous Amada Kingfisher, MD        OBJECTIVE: Middle-aged white woman who appears stated age 19 Vitals:   07/30/13 1157  BP: 103/64  Pulse: 84  Temp: 99.2 F (37.3 C)  Resp: 18     Body mass index is 21.48 kg/(m^2).    ECOG FS: 1 Filed Weights   07/30/13 1157  Weight: 125 lb 3.2 oz (56.79 kg)     Sclerae unicteric, pupils equal and round Oropharynx clear and moist-- no thrush or oral lesions No cervical or supraclavicular adenopathy Lungs no rales or rhonchi, auscultated anterolaterally Heart regular rate and rhythm Abd soft, positive bowel sounds, nontender to palpation MSK no upper extremity lymphedema Neuro: nonfocal, well oriented Breasts: There continues to be a significant mass in the right breast   with evidence of skin involvement, which has receded, currently no erythema, more a dusky area on the skin where there is also an indentation. I do not palpate any right axillary lymph nodes. The left breast is unremarkable  LAB RESULTS:   Lab Results  Component Value Date   WBC 10.0 07/30/2013   NEUTROABS 7.7* 07/30/2013   HGB 9.9* 07/30/2013   HCT 30.6* 07/30/2013   MCV 95.0 07/30/2013   PLT 293 07/30/2013      Chemistry      Component Value Date/Time   NA 139 07/18/2013 1230   NA 135 07/01/2013 1022   K 4.5 07/18/2013 1230   K 4.1 07/01/2013 1022   CL 98 07/01/2013 1022   CO2 27 07/18/2013 1230   CO2 27 07/01/2013 1022   BUN 10.7 07/18/2013 1230   BUN 11 07/01/2013 1022   CREATININE 0.8 07/18/2013 1230   CREATININE 0.65 07/01/2013 1022      Component Value Date/Time   CALCIUM 9.6 07/18/2013 1230    CALCIUM 8.8 07/01/2013 1022   ALKPHOS 109 07/18/2013 1230  ALKPHOS 117 06/24/2013 2245   AST 25 07/18/2013 1230   AST 22 06/24/2013 2245   ALT 19 07/18/2013 1230   ALT 28 06/24/2013 2245   BILITOT 0.27 07/18/2013 1230   BILITOT 0.3 06/24/2013 2245        STUDIES: Dg Chest 2 View  06/24/2013   CLINICAL DATA:  Fever.  Cough for 1 day.  EXAM: CHEST  2 VIEW  COMPARISON:  DG ABD ACUTE W/CHEST dated 06/23/2013; CT CHEST W/CM dated 03/17/2013; CT CHEST W/CM dated 05/16/2013  FINDINGS: Pulmonary nodules demonstrated on prior CT are difficult to visualize radiographically. Right IJ Port-A-Cath appears unchanged. Chronic bronchitic changes at the bases.  No airspace disease or pleural effusion identified. No interval change. Cardiopericardial silhouette is within normal limits.  IMPRESSION: No interval change or acute abnormality. Pulmonary nodules demonstrated on prior chest CT compatible with metastatic disease are difficult to resolve radiographically.   Electronically Signed   By: Dereck Ligas M.D.   On: 06/24/2013 23:03   Ct Chest W Contrast  07/15/2013   CLINICAL DATA:  Breast cancer  EXAM: CT CHEST AND ABDOMEN WITH CONTRAST  TECHNIQUE: Multidetector CT imaging of the chest and abdomen was performed following the standard protocol during bolus administration of intravenous contrast.  CONTRAST:  160mL OMNIPAQUE IOHEXOL 300 MG/ML  SOLN  COMPARISON:  05/16/2013  FINDINGS: CT CHEST FINDINGS  The heart size appears normal. There is no pericardial effusion. Stable 5 mm pre-vascular lymph node, image 22/series 2. The right paratracheal lymph node measures 5 mm and is unchanged from previous exam. No enlarged mediastinal or hilar lymph nodes identified.  There is no pleural effusion identified. Pulmonary nodules are again identified. Right upper lobe nodule measures 5 mm, image 19/ series 5. Previously 9 mm. Index subpleural nodule within the left upper lobe measures 5 mm, image 21/series 5. This is compared with 8 mm  previously. Index nodule within the superior segment of right lower lobe measures 3 mm, image 29/ series 5. Previously 6 mm. No new or progressive pulmonary nodularity.  There is a mass within the right breast measuring 5.7 x 3.9 cm, image 33/series 2. This is improved from 7.8 x 5.6 cm previously. No axillary or supraclavicular adenopathy identified.  Review of the visualized bony structures shows no aggressive lytic or sclerotic bone lesions.  CT ABDOMEN FINDINGS  Multi focal low-attenuation lesions within the liver parenchyma are identified. Index lesion within the right hepatic lobe posteriorly measures 9 mm, image 57/ series 2. Previously 2.1 cm. Medial segment of left hepatic lobe lesion measures 6 mm, image 63/series 2. Previously 1.7 cm. No new or progressive liver metastasis. The gallbladder appears normal. No biliary dilatation. Normal appearance of the pancreas. The spleen is on unremarkable.  The adrenal glands are both normal. Normal appearance of both kidneys.  The abdominal aorta appears within normal limits. There is no upper abdominal adenopathy identified.  The stomach is normal. The small bowel loops have a normal course and caliber the visualized portions of the colon are on unremarkable.  Review of the visualized bony structures demonstrates no aggressive lytic or sclerotic bone lesions.  IMPRESSION: 1. Interval response to therapy. 2. Decrease in size of right breast mass, pulmonary metastasis and liver lesions.   Electronically Signed   By: Kerby Moors M.D.   On: 07/15/2013 15:28   Ct Abdomen W Contrast  07/15/2013   CLINICAL DATA:  Breast cancer  EXAM: CT CHEST AND ABDOMEN WITH CONTRAST  TECHNIQUE: Multidetector CT imaging  of the chest and abdomen was performed following the standard protocol during bolus administration of intravenous contrast.  CONTRAST:  131mL OMNIPAQUE IOHEXOL 300 MG/ML  SOLN  COMPARISON:  05/16/2013  FINDINGS: CT CHEST FINDINGS  The heart size appears normal. There  is no pericardial effusion. Stable 5 mm pre-vascular lymph node, image 22/series 2. The right paratracheal lymph node measures 5 mm and is unchanged from previous exam. No enlarged mediastinal or hilar lymph nodes identified.  There is no pleural effusion identified. Pulmonary nodules are again identified. Right upper lobe nodule measures 5 mm, image 19/ series 5. Previously 9 mm. Index subpleural nodule within the left upper lobe measures 5 mm, image 21/series 5. This is compared with 8 mm previously. Index nodule within the superior segment of right lower lobe measures 3 mm, image 29/ series 5. Previously 6 mm. No new or progressive pulmonary nodularity.  There is a mass within the right breast measuring 5.7 x 3.9 cm, image 33/series 2. This is improved from 7.8 x 5.6 cm previously. No axillary or supraclavicular adenopathy identified.  Review of the visualized bony structures shows no aggressive lytic or sclerotic bone lesions.  CT ABDOMEN FINDINGS  Multi focal low-attenuation lesions within the liver parenchyma are identified. Index lesion within the right hepatic lobe posteriorly measures 9 mm, image 57/ series 2. Previously 2.1 cm. Medial segment of left hepatic lobe lesion measures 6 mm, image 63/series 2. Previously 1.7 cm. No new or progressive liver metastasis. The gallbladder appears normal. No biliary dilatation. Normal appearance of the pancreas. The spleen is on unremarkable.  The adrenal glands are both normal. Normal appearance of both kidneys.  The abdominal aorta appears within normal limits. There is no upper abdominal adenopathy identified.  The stomach is normal. The small bowel loops have a normal course and caliber the visualized portions of the colon are on unremarkable.  Review of the visualized bony structures demonstrates no aggressive lytic or sclerotic bone lesions.  IMPRESSION: 1. Interval response to therapy. 2. Decrease in size of right breast mass, pulmonary metastasis and liver  lesions.   Electronically Signed   By: Kerby Moors M.D.   On: 07/15/2013 15:28   Mr Jeri Cos [id number] Contrast  07/15/2013   CLINICAL DATA:  Stage IV breast cancer.  Headaches.  EXAM: MRI HEAD WITHOUT AND WITH CONTRAST  TECHNIQUE: Multiplanar, multiecho pulse sequences of the brain and surrounding structures were obtained without and with intravenous contrast.  CONTRAST:  63mL MULTIHANCE GADOBENATE DIMEGLUMINE 529 MG/ML IV SOLN  COMPARISON:  None.  FINDINGS: No acute stroke or hemorrhage. No mass lesion or hydrocephalus. No extra-axial fluid.  Mild cerebral and cerebellar atrophy. Mild subcortical and periventricular T2 and FLAIR hyperintensities, likely chronic microvascular ischemic change. Flow voids are maintained throughout the carotid, basilar, and vertebral arteries. There are no areas of chronic hemorrhage. Pituitary, pineal, and cerebellar tonsils unremarkable. No upper cervical lesions.  Post infusion, there is no abnormal enhancement of the brain or meninges. Visualized calvarium, skull base, and upper cervical osseous structures unremarkable. Scalp and extracranial soft tissues, orbits, sinuses, and mastoids show no acute process.  IMPRESSION: Mild atrophy and small vessel disease. No acute intracranial findings.   Electronically Signed   By: Rolla Flatten M.D.   On: 07/15/2013 16:31   Dg Abd Acute W/chest  06/23/2013   CLINICAL DATA:  Umbilical pain. History of breast cancer with pulmonary metastases.  EXAM: ACUTE ABDOMEN SERIES (ABDOMEN 2 VIEW & CHEST 1 VIEW)  COMPARISON:  CT  chest 05/16/2013  FINDINGS: Port-A-Cath remains in place. Pulmonary nodules are noted but better demonstrated on CT scan. The lungs are otherwise clear. Heart size is normal. No pneumothorax or pleural fluid.  Two views of the abdomen show no free intraperitoneal air. The bowel gas pattern is normal.  IMPRESSION: No acute finding chest or abdomen. Pulmonary nodules consistent with metastatic breast cancer are noted as seen on prior  CT.   Electronically Signed   By: Inge Rise M.D.   On: 06/23/2013 20:49   ASSESSMENT: 63 y.o. [name], [place] woman status post right breast upper outer quadrant and right axillary lymph node biopsy 12/23/2012 for a clinical T3 N1 M1, stage IV invasive ductal carcinoma, grade 3, triple negative, with an MIB-1 of 86%.  (1) right liver lobe biopsy 01/21/2013 confirms metastatic adenocarcinoma; scans show involvement of the liver, lungs, and likely bone.  (2) enrolled in [facility] study [id number] (docetaxel + oral gamma secretase inibitor [id number]), received one cycle starting 02/04/2013  but withdrew because of poor tolerance despite treatment interruptions and decreased dosing of the oral component  (3)  chest CT in early November 2014 was compared with studies in August 2014 and showed interval progression of metastatic breast cancer, with an enlarging primary right breast mass, enlarging right axillary lymph nodes, enlarging pulmonary nodules and new/enlarging hepatic metastases.  (4) Abraxane started 03/19/2013, given day 1 and day 8 of each 21 day cycle, stopped with the day 1 cycle 3 dose (04/29/2013) because of local progression of disease  (5) cyclophosphamide and doxorubicin started 05/20/2013, given in dose dense fashion  with Neulasta support on day 2. Completed 4 cycles 07/03/2013, with the final cycle dose reduced 15% because of an episode of febrile neutropenia after cycle 3. Restaging studies 07/15/2013 showed partial response.  (6) to start capecitabine 1.5 g by mouth twice a day 7 days on 7 days off in April  (7) to be evaluated by GI for possible peptic ulcer   PLAN: Breast cancer as above Currently off chemotherapy after 4 cycles of ddAC with good partial response. Xeloda has been ordered but not available yet and patient not to start till April visit with Dr.Magrinat.  Ongoing problems with nausea,poor oral intake and abdominal pain increasing.No reflux symptoms.Has not seen  GI yet Dr.Johnson IV hydration again today since fluids seem to help.If diarrhea or fever,needs stool cultures.Continue Mylanta and Omeprazole. Take Probiotic 20 million once daily.  Recent CT abdomen indicated disease response to chemotherapy and decreasing liver lesions.Consider PET scan if symptoms continue.  Mild  leucocytosis? Neulasta(1 month ago)versus low grade infection. Severe headaches, MRI negative,nasal congestion,history of allergies,clinical suspect ongoing sinusitis, willl treat with Biaxin 500 mg one twice daily for 2 weeks/patient allergic to penicillin.I told her diarrhea/abdominal pain could  worsen from Biaxin. Stop Exedrin.  Check today CBC,CMP,Vit D level.Follow up  In 2 weeks or as needed ,then as scheduled Dr.Magrinat 08/26/2013.   Patient has a good understanding of this plan. She agrees with it. She knows a goal of treatment in her case is control. She will call with any problems that may develop before her return visit here in about 3 weeks.    Amada Kingfisher, MDOncology/Hematology    07/30/2013 2:21 PM

## 2013-07-30 NOTE — Patient Instructions (Signed)
Dehydration, Adult  Dehydration means your body does not have as much fluid as it needs. Your kidneys, brain, and heart will not work properly without the right amount of fluids and salt.   HOME CARE   Ask your doctor how to replace body fluid losses (rehydrate).   Drink enough fluids to keep your pee (urine) clear or pale yellow.   Drink small amounts of fluids often if you feel sick to your stomach (nauseous) or throw up (vomit).   Eat like you normally do.   Avoid:   Foods or drinks high in sugar.   Bubbly (carbonated) drinks.   Juice.   Very hot or cold fluids.   Drinks with caffeine.   Fatty, greasy foods.   Alcohol.   Tobacco.   Eating too much.   Gelatin desserts.   Wash your hands to avoid spreading germs (bacteria, viruses).   Only take medicine as told by your doctor.   Keep all doctor visits as told.  GET HELP RIGHT AWAY IF:    You cannot drink something without throwing up.   You get worse even with treatment.   Your vomit has blood in it or looks greenish.   Your poop (stool) has blood in it or looks black and tarry.   You have not peed in 6 to 8 hours.   You pee a small amount of very dark pee.   You have a fever.   You pass out (faint).   You have belly (abdominal) pain that gets worse or stays in one spot (localizes).   You have a rash, stiff neck, or bad headache.   You get easily annoyed, sleepy, or are hard to wake up.   You feel weak, dizzy, or very thirsty.  MAKE SURE YOU:    Understand these instructions.   Will watch your condition.   Will get help right away if you are not doing well or get worse.  Document Released: 02/25/2009 Document Revised: 07/24/2011 Document Reviewed: 12/19/2010  Exit[employer] Patient Information 2014 [employer].

## 2013-07-31 ENCOUNTER — Ambulatory Visit (HOSPITAL_BASED_OUTPATIENT_CLINIC_OR_DEPARTMENT_OTHER): Payer: BC Managed Care – PPO

## 2013-07-31 ENCOUNTER — Other Ambulatory Visit: Payer: Self-pay | Admitting: *Deleted

## 2013-07-31 ENCOUNTER — Encounter: Payer: Self-pay | Admitting: *Deleted

## 2013-07-31 ENCOUNTER — Other Ambulatory Visit: Payer: Self-pay

## 2013-07-31 VITALS — BP 114/68 | HR 84 | Temp 97.2°F | Resp 18

## 2013-07-31 DIAGNOSIS — C787 Secondary malignant neoplasm of liver and intrahepatic bile duct: Secondary | ICD-10-CM

## 2013-07-31 DIAGNOSIS — C7952 Secondary malignant neoplasm of bone marrow: Secondary | ICD-10-CM

## 2013-07-31 DIAGNOSIS — C50919 Malignant neoplasm of unspecified site of unspecified female breast: Secondary | ICD-10-CM

## 2013-07-31 DIAGNOSIS — C7951 Secondary malignant neoplasm of bone: Secondary | ICD-10-CM

## 2013-07-31 DIAGNOSIS — C50419 Malignant neoplasm of upper-outer quadrant of unspecified female breast: Secondary | ICD-10-CM

## 2013-07-31 DIAGNOSIS — E860 Dehydration: Secondary | ICD-10-CM

## 2013-07-31 LAB — VITAMIN D 25 HYDROXY (VIT D DEFICIENCY, FRACTURES): Vit D, 25-Hydroxy: 52 ng/mL (ref 30–89)

## 2013-07-31 MED ORDER — HEPARIN SOD (PORK) LOCK FLUSH 100 UNIT/ML IV SOLN
500.0000 [IU] | Freq: Once | INTRAVENOUS | Status: AC
  Administered 2013-07-31: 500 [IU] via INTRAVENOUS
  Filled 2013-07-31: qty 5

## 2013-07-31 MED ORDER — SODIUM CHLORIDE 0.9 % IV SOLN
1000.0000 mL | Freq: Once | INTRAVENOUS | Status: DC
  Administered 2013-07-31: 1000 mL via INTRAVENOUS

## 2013-07-31 MED ORDER — SODIUM CHLORIDE 0.9 % IJ SOLN
10.0000 mL | Freq: Once | INTRAMUSCULAR | Status: AC
  Administered 2013-07-31: 10 mL via INTRAVENOUS
  Filled 2013-07-31: qty 10

## 2013-07-31 NOTE — Progress Notes (Signed)
This RN spoke with pt for update post 2 days of IVF -  Carrie Berry states " I feel like I am gaining some ground, the diarrhea has lessened and I am eating better ".  Per discussion pt states she feels IVF have been beneficial and is planning on coming in today for 3rd day of therapy.  Above reviewed with covering provider and order obtained to repeat IVF.  Order entered.

## 2013-07-31 NOTE — Patient Instructions (Signed)
Dehydration, Adult Dehydration is when you lose more fluids from the body than you take in. Vital organs like the kidneys, brain, and heart cannot function without a proper amount of fluids and salt. Any loss of fluids from the body can cause dehydration.  CAUSES   Vomiting.  Diarrhea.  Excessive sweating.  Excessive urine output.  Fever. SYMPTOMS  Mild dehydration  Thirst.  Dry lips.  Slightly dry mouth. Moderate dehydration  Very dry mouth.  Sunken eyes.  Skin does not bounce back quickly when lightly pinched and released.  Dark urine and decreased urine production.  Decreased tear production.  Headache. Severe dehydration  Very dry mouth.  Extreme thirst.  Rapid, weak pulse (more than 100 beats per minute at rest).  Cold hands and feet.  Not able to sweat in spite of heat and temperature.  Rapid breathing.  Blue lips.  Confusion and lethargy.  Difficulty being awakened.  Minimal urine production.  No tears. DIAGNOSIS  Your caregiver will diagnose dehydration based on your symptoms and your exam. Blood and urine tests will help confirm the diagnosis. The diagnostic evaluation should also identify the cause of dehydration. TREATMENT  Treatment of mild or moderate dehydration can often be done at home by increasing the amount of fluids that you drink. It is best to drink small amounts of fluid more often. Drinking too much at one time can make vomiting worse. Refer to the home care instructions below. Severe dehydration needs to be treated at the hospital where you will probably be given intravenous (IV) fluids that contain water and electrolytes. HOME CARE INSTRUCTIONS   Ask your caregiver about specific rehydration instructions.  Drink enough fluids to keep your urine clear or pale yellow.  Drink small amounts frequently if you have nausea and vomiting.  Eat as you normally do.  Avoid:  Foods or drinks high in sugar.  Carbonated  drinks.  Juice.  Extremely hot or cold fluids.  Drinks with caffeine.  Fatty, greasy foods.  Alcohol.  Tobacco.  Overeating.  Gelatin desserts.  Wash your hands well to avoid spreading bacteria and viruses.  Only take over-the-counter or prescription medicines for pain, discomfort, or fever as directed by your caregiver.  Ask your caregiver if you should continue all prescribed and over-the-counter medicines.  Keep all follow-up appointments with your caregiver. SEEK MEDICAL CARE IF:  You have abdominal pain and it increases or stays in one area (localizes).  You have a rash, stiff neck, or severe headache.  You are irritable, sleepy, or difficult to awaken.  You are weak, dizzy, or extremely thirsty. SEEK IMMEDIATE MEDICAL CARE IF:   You are unable to keep fluids down or you get worse despite treatment.  You have frequent episodes of vomiting or diarrhea.  You have blood or green matter (bile) in your vomit.  You have blood in your stool or your stool looks black and tarry.  You have not urinated in 6 to 8 hours, or you have only urinated a small amount of very dark urine.  You have a fever.  You faint. MAKE SURE YOU:   Understand these instructions.  Will watch your condition.  Will get help right away if you are not doing well or get worse. Document Released: 05/01/2005 Document Revised: 07/24/2011 Document Reviewed: 12/19/2010 Exit[employer] Patient Information 2014 [employer].  

## 2013-08-01 ENCOUNTER — Encounter: Payer: Self-pay | Admitting: *Deleted

## 2013-08-01 NOTE — Progress Notes (Signed)
RECEIVED A FAX FROM [facility] CONCERNING A CONFIRMATION OF PRESCRIPTION SHIPMENT FOR CAPECITABINE ON 07/31/13. 

## 2013-08-04 ENCOUNTER — Other Ambulatory Visit: Payer: Self-pay | Admitting: Oncology

## 2013-08-04 DIAGNOSIS — C50419 Malignant neoplasm of upper-outer quadrant of unspecified female breast: Secondary | ICD-10-CM

## 2013-08-05 ENCOUNTER — Other Ambulatory Visit: Payer: Self-pay | Admitting: Oncology

## 2013-08-05 ENCOUNTER — Other Ambulatory Visit: Payer: Self-pay | Admitting: *Deleted

## 2013-08-05 DIAGNOSIS — C50419 Malignant neoplasm of upper-outer quadrant of unspecified female breast: Secondary | ICD-10-CM

## 2013-08-06 ENCOUNTER — Other Ambulatory Visit: Payer: Self-pay | Admitting: *Deleted

## 2013-08-06 ENCOUNTER — Telehealth: Payer: Self-pay | Admitting: *Deleted

## 2013-08-06 NOTE — Telephone Encounter (Signed)
Received referral status update from [facility] re: per pt's plan, specialty medication will need to be processed through [facility].   Form given to care management.

## 2013-08-14 ENCOUNTER — Other Ambulatory Visit: Payer: Self-pay | Admitting: Physician Assistant

## 2013-08-14 DIAGNOSIS — C800 Disseminated malignant neoplasm, unspecified: Secondary | ICD-10-CM

## 2013-08-18 ENCOUNTER — Other Ambulatory Visit: Payer: Self-pay | Admitting: Gastroenterology

## 2013-08-18 ENCOUNTER — Ambulatory Visit: Payer: BC Managed Care – PPO | Admitting: Oncology

## 2013-08-18 ENCOUNTER — Other Ambulatory Visit: Payer: BC Managed Care – PPO

## 2013-08-19 ENCOUNTER — Encounter (HOSPITAL_COMMUNITY): Payer: Self-pay

## 2013-08-19 ENCOUNTER — Ambulatory Visit (HOSPITAL_COMMUNITY)
Admission: RE | Admit: 2013-08-19 | Discharge: 2013-08-19 | Disposition: A | Discharge status: Home / Self Care | Payer: BC Managed Care – PPO | Source: Ambulatory Visit | Attending: Gastroenterology | Admitting: Gastroenterology

## 2013-08-19 ENCOUNTER — Encounter (HOSPITAL_COMMUNITY)
Admission: RE | Disposition: A | Discharge status: Home / Self Care | Payer: Self-pay | Source: Ambulatory Visit | Attending: Gastroenterology

## 2013-08-19 DIAGNOSIS — Z79899 Other long term (current) drug therapy: Secondary | ICD-10-CM | POA: Insufficient documentation

## 2013-08-19 DIAGNOSIS — R1013 Epigastric pain: Secondary | ICD-10-CM

## 2013-08-19 DIAGNOSIS — K3189 Other diseases of stomach and duodenum: Secondary | ICD-10-CM | POA: Insufficient documentation

## 2013-08-19 DIAGNOSIS — C50919 Malignant neoplasm of unspecified site of unspecified female breast: Secondary | ICD-10-CM | POA: Insufficient documentation

## 2013-08-19 DIAGNOSIS — K2990 Gastroduodenitis, unspecified, without bleeding: Principal | ICD-10-CM

## 2013-08-19 DIAGNOSIS — K2970 Gastritis, unspecified, without bleeding: Secondary | ICD-10-CM | POA: Insufficient documentation

## 2013-08-19 HISTORY — PX: ESOPHAGOGASTRODUODENOSCOPY: SHX5428

## 2013-08-19 SURGERY — EGD (ESOPHAGOGASTRODUODENOSCOPY)
Anesthesia: Moderate Sedation

## 2013-08-19 MED ORDER — FENTANYL CITRATE 0.05 MG/ML IJ SOLN
INTRAMUSCULAR | Status: DC | PRN
  Administered 2013-08-19 (×2): 25 ug via INTRAVENOUS

## 2013-08-19 MED ORDER — ONDANSETRON HCL 4 MG/2ML IJ SOLN
INTRAMUSCULAR | Status: DC | PRN
  Administered 2013-08-19: 4 mg via INTRAVENOUS

## 2013-08-19 MED ORDER — BUTAMBEN-TETRACAINE-BENZOCAINE 2-2-14 % EX AERO
INHALATION_SPRAY | CUTANEOUS | Status: DC | PRN
  Administered 2013-08-19: 2 via TOPICAL

## 2013-08-19 MED ORDER — FENTANYL CITRATE 0.05 MG/ML IJ SOLN
INTRAMUSCULAR | Status: AC
  Filled 2013-08-19: qty 4

## 2013-08-19 MED ORDER — ONDANSETRON HCL 4 MG/2ML IJ SOLN
INTRAMUSCULAR | Status: AC
  Filled 2013-08-19: qty 2

## 2013-08-19 MED ORDER — SODIUM CHLORIDE 0.9 % IV SOLN: INTRAVENOUS | Status: DC

## 2013-08-19 MED ORDER — MIDAZOLAM HCL 10 MG/2ML IJ SOLN
INTRAMUSCULAR | Status: AC
  Filled 2013-08-19: qty 4

## 2013-08-19 MED ORDER — DIPHENHYDRAMINE HCL 50 MG/ML IJ SOLN
INTRAMUSCULAR | Status: AC
  Filled 2013-08-19: qty 1

## 2013-08-19 MED ORDER — MIDAZOLAM HCL 10 MG/2ML IJ SOLN
INTRAMUSCULAR | Status: DC | PRN
  Administered 2013-08-19: 2.5 mg via INTRAVENOUS
  Administered 2013-08-19: 3 mg via INTRAVENOUS
  Administered 2013-08-19: 2 mg via INTRAVENOUS
  Administered 2013-08-19: 2.5 mg via INTRAVENOUS

## 2013-08-19 NOTE — H&P (Signed)
  Problem: Epigastric ulcer-like dyspepsia  History: The patient is a 63 year old female born 1951/01/05. She is receiving chemotherapy for breast cancer.  The patient has developed an ulcer-like epigastric discomfort associated with nausea but no vomiting or gastrointestinal bleeding since starting chemotherapy. She does not take nonsteroidal anti-inflammatory medication. She currently takes omeprazole each morning. She denies a past history of peptic ulcer disease.  The patient is scheduled to undergo a diagnostic esophagogastroduodenoscopy.  Past medical history: Normal surveillance colonoscopy 05/29/2005. Breast cancer. Tubal ligation  Medication allergies: Codeine. Penicillin. Septra.  Exam: The patient is alert and lying comfortably on the endoscopy stretcher. Abdomen is soft and nontender to palpation. Lungs are clear to auscultation. Cardiac exam reveals a regular rhythm.  Plan: Proceed with diagnostic esophagogastroduodenoscopy

## 2013-08-19 NOTE — Discharge Instructions (Signed)
Gastrointestinal Endoscopy °Care After °Refer to this sheet in the next few weeks. These instructions provide you with information on caring for yourself after your procedure. Your caregiver may also give you more specific instructions. Your treatment has been planned according to current medical practices, but problems sometimes occur. Call your caregiver if you have any problems or questions after your procedure. °HOME CARE INSTRUCTIONS °· If you were given medicine to help you relax (sedative), do not drive, operate machinery, or sign important documents for 24 hours. °· Avoid alcohol and hot or warm beverages for the first 24 hours after the procedure. °· Only take over-the-counter or prescription medicines for pain, discomfort, or fever as directed by your caregiver. You may resume taking your normal medicines unless your caregiver tells you otherwise. Ask your caregiver when you may resume taking medicines that may cause bleeding, such as aspirin, clopidogrel, or warfarin. °· You may return to your normal diet and activities on the day after your procedure, or as directed by your caregiver. Walking may help to reduce any bloated feeling in your abdomen. °· Drink enough fluids to keep your urine clear or pale yellow. °· You may gargle with salt water if you have a sore throat. °SEEK IMMEDIATE MEDICAL CARE IF: °· You have severe nausea or vomiting. °· You have severe abdominal pain, abdominal cramps that last longer than 6 hours, or abdominal swelling (distention). °· You have severe shoulder or back pain. °· You have trouble swallowing. °· You have shortness of breath, your breathing is shallow, or you are breathing faster than normal. °· You have a fever or a rapid heartbeat. °· You vomit blood or material that looks like coffee grounds. °· You have bloody, black, or tarry stools. °MAKE SURE YOU: °· Understand these instructions. °· Will watch your condition. °· Will get help right away if you are not doing  well or get worse. °Document Released: 12/14/2003 Document Revised: 10/31/2011 Document Reviewed: 08/01/2011 °ExitCare® Patient Information ©2014 [employer]. ° °

## 2013-08-19 NOTE — Op Note (Signed)
Problem: Epigastric ulcer-like dyspepsia on chemotherapy for breast cancer  Endoscopist: Earle Gell  Premedication: Versed 10 mg. Zofran 4 mg. Fentanyl 50 mcg.  Procedure: Diagnostic esophagogastroduodenoscopy The patient was placed in the left lateral decubitus position. The Pentax gastroscope was passed through the posterior hypopharynx into the proximal esophagus without difficulty. The hypopharynx, larynx, and vocal cords appeared normal.  Esophagoscopy: The proximal, mid, and lower segments of the esophageal mucosa appeared normal. The squamocolumnar junction is irregular with a short length tongue of Barrett's appearing mucosa extending up from the squamocolumnar junction at 40 cm from the incisor teeth. Biopsies were performed.  Gastroscopy: Retroflex view of the gastric cardia and fundus was normal. The gastric body, antrum, and pylorus appeared normal. Random gastric biopsies were performed to look for H. pylori gastritis.  Duodenoscopy: The duodenal bulb and descending duodenum appeared normal  Assessment:  #1. Irregular squamocolumnar junction at the esophagogastric junction with a short length segment of Barrett's appearing mucosa extending up from the squamocolumnar junction at 40 cm from the incisor teeth. Biopsies performed  #2. Otherwise normal esophagogastroduodenoscopy.  #3. Random gastric biopsies performed to look for H. pylori gastritis.

## 2013-08-20 ENCOUNTER — Encounter (HOSPITAL_COMMUNITY): Payer: Self-pay | Admitting: Gastroenterology

## 2013-08-26 ENCOUNTER — Telehealth: Payer: Self-pay | Admitting: Oncology

## 2013-08-26 ENCOUNTER — Ambulatory Visit (HOSPITAL_BASED_OUTPATIENT_CLINIC_OR_DEPARTMENT_OTHER): Payer: BC Managed Care – PPO | Admitting: Oncology

## 2013-08-26 ENCOUNTER — Other Ambulatory Visit (HOSPITAL_BASED_OUTPATIENT_CLINIC_OR_DEPARTMENT_OTHER): Payer: BC Managed Care – PPO

## 2013-08-26 VITALS — BP 123/73 | HR 88 | Temp 97.2°F | Resp 18 | Ht 64.0 in | Wt 121.9 lb

## 2013-08-26 DIAGNOSIS — C7800 Secondary malignant neoplasm of unspecified lung: Secondary | ICD-10-CM

## 2013-08-26 DIAGNOSIS — C50411 Malignant neoplasm of upper-outer quadrant of right female breast: Secondary | ICD-10-CM

## 2013-08-26 DIAGNOSIS — C7951 Secondary malignant neoplasm of bone: Secondary | ICD-10-CM

## 2013-08-26 DIAGNOSIS — C50919 Malignant neoplasm of unspecified site of unspecified female breast: Secondary | ICD-10-CM

## 2013-08-26 DIAGNOSIS — C50419 Malignant neoplasm of upper-outer quadrant of unspecified female breast: Secondary | ICD-10-CM

## 2013-08-26 DIAGNOSIS — R0981 Nasal congestion: Secondary | ICD-10-CM

## 2013-08-26 DIAGNOSIS — C7952 Secondary malignant neoplasm of bone marrow: Secondary | ICD-10-CM

## 2013-08-26 DIAGNOSIS — C773 Secondary and unspecified malignant neoplasm of axilla and upper limb lymph nodes: Secondary | ICD-10-CM

## 2013-08-26 DIAGNOSIS — C787 Secondary malignant neoplasm of liver and intrahepatic bile duct: Secondary | ICD-10-CM

## 2013-08-26 DIAGNOSIS — K219 Gastro-esophageal reflux disease without esophagitis: Secondary | ICD-10-CM

## 2013-08-26 DIAGNOSIS — R1013 Epigastric pain: Secondary | ICD-10-CM

## 2013-08-26 DIAGNOSIS — Z171 Estrogen receptor negative status [ER-]: Secondary | ICD-10-CM

## 2013-08-26 LAB — CBC WITH DIFFERENTIAL/PLATELET
BASO%: 0.6 % (ref 0.0–2.0)
BASOS ABS: 0.1 10*3/uL (ref 0.0–0.1)
EOS ABS: 0.9 10*3/uL — AB (ref 0.0–0.5)
EOS%: 10.1 % — ABNORMAL HIGH (ref 0.0–7.0)
HEMATOCRIT: 35.5 % (ref 34.8–46.6)
HGB: 11.7 g/dL (ref 11.6–15.9)
LYMPH%: 16 % (ref 14.0–49.7)
MCH: 31.4 pg (ref 25.1–34.0)
MCHC: 32.8 g/dL (ref 31.5–36.0)
MCV: 95.5 fL (ref 79.5–101.0)
MONO#: 0.6 10*3/uL (ref 0.1–0.9)
MONO%: 6.7 % (ref 0.0–14.0)
NEUT%: 66.6 % (ref 38.4–76.8)
NEUTROS ABS: 5.8 10*3/uL (ref 1.5–6.5)
PLATELETS: 207 10*3/uL (ref 145–400)
RBC: 3.72 10*6/uL (ref 3.70–5.45)
RDW: 14.3 % (ref 11.2–14.5)
WBC: 8.7 10*3/uL (ref 3.9–10.3)
lymph#: 1.4 10*3/uL (ref 0.9–3.3)

## 2013-08-26 NOTE — Progress Notes (Signed)
. Patient ID: Carrie Berry, female   DOB: Aug 23, 1950, 63 y.o.   MRN: [id number] ID: [name] OB: June 11, 1950  MR#: [id number]  CSN#:[id number]  PCP: [name], MD GYN:   SU: [name] OTHER MD: Thea Silversmith, Earle Gell, Christene Slates, Erline Levine  CHIEF COMPLAINT:  Metastatic Breast Cancer   BREAST CANCER HISTORY:  [place] noted a mass in her right breast December of 2013, but did not think much of it. It did grow some lower the summer, but she was keeping her grandchild at that time and was too busy so she did not bring it to Dr. Bernell List attention until August. He set her up for bilateral mammography and ultrasonography at [facility] 12/18/2012 and this measured, on the right, a large irregular lobulated mass in the upper outer quadrant which by ultrasound measured 5.7 cm. The right axilla showed some lymph nodes with thickened cortices. In the left breast there was a focal asymmetry at the depth, lateral to the nipple, and by ultrasound there was a 9 mm ill-defined hypoechoic lesion in this location. Biopsy of the left breast lesion showed only fibrocystic changes.  Biopsy of the right breast mass and right axillary adenopathy ([id number]) on 12/23/2012 showed both to be involved by invasive ductal carcinoma, grade 2, triple negative, with an MIB-1 of 86%.  MRI of the breast 12/27/2012 at [facility] imaging showed in the right breast a mass abutting the pectoralis muscle without enhancement of the muscle measuring 4.8 cm. The satellite nodule measuring approximately 3 mm was also noted superior to the mass and several abnormal and enlarged right axillary lymph nodes were noted, one of which appeared to be necrotic. The largest node measured 2.0 cm. Unfortunately, numerous bilateral pulmonary nodules were also noted.  The patient's subsequent history is as detailed below   INTERVAL HISTORY: [name] returns today accompanied by her husband [name]. Since her last visit here her abdominal  pain was evaluated by Dr. Wynetta Emery, with an EGD showing minimal changes at the gastroesophageal junction, but biopsies negative and no evidence of H. Pylori.  REVIEW OF SYSTEMS Despite this benign report, she continues to have significant epigastric pain. This localizes to a point just below the xiphoid process. It is not related to meals or bowel movements. She has taken Tylenol and tramadol without success. The pain makes her tired and keeps her from eating. Of course she also has a "tag" problem which is not new. She doesn't have nausea or vomiting as such and there is no taste perversion. Aside from this, she is walking about a half a mile a day, has normal bowel movements, mild sinus symptoms, and she feels her breast is unchanged on the right. A detailed review of systems today was otherwise noncontributory  PAST MEDICAL HISTORY: Past Medical History  Diagnosis Date  . Recurrent sinus infections   . Arthritis   . PONV (postoperative nausea and vomiting)   . Anxiety   . GERD (gastroesophageal reflux disease)   . 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 rt breast ca dx'd 12/2011    breast  . Breast cancer     PAST SURGICAL HISTORY: Past Surgical History  Procedure Laterality Date  . Tubal ligation    . Tubal ligation  1985  . Portacath placement N/A 01/10/2013    Procedure: ULTRASOUND GUIDED PORT-A-CATH INSERTION WITH FLUOROSCOPY;  Surgeon: [name], MD;  Location: [facility];  Service: General;  Laterality: N/A;  . Esophagogastroduodenoscopy N/A 08/19/2013  Procedure: ESOPHAGOGASTRODUODENOSCOPY (EGD);  Surgeon: Garlan Fair, MD;  Location: [facility];  Service: Endoscopy;  Laterality: N/A;    FAMILY HISTORY Family History  Problem Relation Age of Onset  . Bladder Cancer Father   . Colon cancer Maternal Grandmother    the patient's parents are living, both in their 42s. The patient's father was diagnosed with bladder cancer the age of 43. The  patient's mother's mother had some type of gastrointestinal cancer. The patient had one brother, no sisters. There is no history of breast or ovarian cancer in the family other than a cousin on the father's side who was diagnosed with breast cancer apparently before the age of 78.  GYNECOLOGIC HISTORY:  Menarche age 73, first live birth age 13, the patient is [place] P2. She went through menopause in 2008. She did not take hormone replacement. She took birth control for approximately 22 years remotely.  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 She's currently on disability. Her husband Nithya Meriweather [name]"), is vice Development worker, international aid for [employer]. The patient's daughter [name] is a stay-at-home mom in [place], the patient's son Amya Hlad unfortunately died in an automobile accident in December of 2011. The patient has 2 grandchildren. She attends to a local [employer].    ADVANCED DIRECTIVES: Not in place   HEALTH MAINTENANCE: (Updated December 2014) History  Substance Use Topics  . Smoking status: Never Smoker   . Smokeless tobacco: Never Used  . Alcohol Use: No     Colonoscopy: 2005  PAP: Not on file  Bone density: Never  Lipid panel:  Dr. Delfina Redwood   Allergies  Allergen Reactions  . Ondansetron Hcl Other (See Comments)    HEADACHE, patient states she still takes   . Codeine Nausea And Vomiting    "violently ill"  . Penicillins Rash    Childhood reaction -    Current Outpatient Prescriptions  Medication Sig Dispense Refill  . acetaminophen (TYLENOL) 325 MG tablet Take 650 mg by mouth every 8 (eight) hours as needed for pain.      Marland Kitchen aspirin-acetaminophen-caffeine (EXCEDRIN MIGRAINE) 250-250-65 MG per tablet Take 1 tablet by mouth every 6 (six) hours as needed for headache.      Marland Kitchen azithromycin (ZITHROMAX) 1 G powder Take 1 packet (1 g total) by mouth  once.  1 each  0  . capecitabine (XELODA) 500 MG tablet Take 3 tablets (1,500 mg total) by mouth 2 (two) times daily after a meal.  84 tablet  4  . clarithromycin (BIAXIN) 500 MG tablet Take 1 tablet (500 mg total) by mouth 2 (two) times daily.  28 tablet  0  . dexamethasone (DECADRON) 4 MG tablet Take 2 tablets by mouth once a day on the day after chemotherapy and then take 2 tablets two times a day for 2 days. Take with food.  30 tablet  1  . lidocaine-prilocaine (EMLA) cream Apply topically as needed.  30 g  3  . LORazepam (ATIVAN) 0.5 MG tablet Take 1 tablet (0.5 mg total) by mouth every 8 (eight) hours as needed for anxiety.  15 tablet  0  . omeprazole (PRILOSEC) 20 MG capsule TAKE 1 CAPSULE (20 MG TOTAL) BY MOUTH 2 (TWO) TIMES DAILY.  60 capsule  1  . ondansetron (ZOFRAN ODT) 4 MG disintegrating tablet 2mg  ODT q4 hours prn vomiting  4 tablet  0  . ondansetron (ZOFRAN  ODT) 8 MG disintegrating tablet Take 1 tablet (8 mg total) by mouth every 8 (eight) hours as needed for nausea or vomiting.  20 tablet  0  . ondansetron (ZOFRAN) 8 MG tablet Take 8 mg by mouth every 8 (eight) hours as needed for nausea or vomiting.      Marland Kitchen PATADAY 0.2 % SOLN Place 1 drop into both eyes daily as needed (dry eyes).       . prochlorperazine (COMPAZINE) 10 MG tablet Take 1 tablet (10 mg total) by mouth every 6 (six) hours as needed for nausea.  30 tablet  2  . promethazine (PHENERGAN) 25 MG tablet       . tobramycin-dexamethasone (TOBRADEX) ophthalmic solution Place 1 drop into both eyes daily.  5 mL  1  . traMADol (ULTRAM) 50 MG tablet Take 1 tablet (50 mg total) by mouth every 6 (six) hours as needed for moderate pain.  60 tablet  0  . traZODone (DESYREL) 50 MG tablet TAKE 1 TO 2 TABLETS AT BEDTIME FOR SLEEP  60 tablet  1  . triamcinolone (NASACORT) 55 MCG/ACT nasal inhaler Place 2 sprays into the nose daily.      Marland Kitchen zolpidem (AMBIEN) 5 MG tablet TAKE 1 TABLET AT BEDTIME AS NEEDED FOR SLEEP  20 tablet  0   No  current facility-administered medications for this visit.    OBJECTIVE: Middle-aged white woman who appears stated age 71 Vitals:   08/26/13 1606  BP: 123/73  Pulse: 88  Temp: 97.2 F (36.2 C)  Resp: 18     Body mass index is 20.91 kg/(m^2).    ECOG FS: 1 Filed Weights   08/26/13 1606  Weight: 121 lb 14.4 oz (55.293 kg)     Sclerae unicteric, pupils round and reactive to light Oropharynx clear and moist No cervical or supraclavicular adenopathy Lungs no rales or rhonchi, auscultated anterolaterally Heart regular rate and rhythm Abd soft, positive bowel sounds, nontender to palpation and particularly no tenderness around the xiphoid process area MSK no upper extremity lymphedema Neuro: nonfocal, well oriented, appropriate affect Breasts: The right breast is smaller than the left, somewhat firmer, and indurated laterally where there is an indentation that is very firm and hyperpigmented. This is unchanged from recent exam. There is no evidence of swelling or erythema at present. The left breast is unremarkable  LAB RESULTS:   Lab Results  Component Value Date   WBC 8.7 08/26/2013   NEUTROABS 5.8 08/26/2013   HGB 11.7 08/26/2013   HCT 35.5 08/26/2013   MCV 95.5 08/26/2013   PLT 207 08/26/2013      Chemistry      Component Value Date/Time   NA 138 07/30/2013 1344   NA 135 07/01/2013 1022   K 4.0 07/30/2013 1344   K 4.1 07/01/2013 1022   CL 98 07/01/2013 1022   CO2 26 07/30/2013 1344   CO2 27 07/01/2013 1022   BUN 9.4 07/30/2013 1344   BUN 11 07/01/2013 1022   CREATININE 0.7 07/30/2013 1344   CREATININE 0.65 07/01/2013 1022      Component Value Date/Time   CALCIUM 9.0 07/30/2013 1344   CALCIUM 8.8 07/01/2013 1022   ALKPHOS 79 07/30/2013 1344   ALKPHOS 117 06/24/2013 2245   AST 27 07/30/2013 1344   AST 22 06/24/2013 2245   ALT 22 07/30/2013 1344   ALT 28 06/24/2013 2245   BILITOT 0.28 07/30/2013 1344   BILITOT 0.3 06/24/2013 2245        STUDIES:  No results  found.  ASSESSMENT: 63 y.o. [name], [place] woman status post right breast upper outer quadrant and right axillary lymph node biopsy 12/23/2012 for a clinical T3 N1 M1, stage IV invasive ductal carcinoma, grade 3, triple negative, with an MIB-1 of 86%.  (1) right liver lobe biopsy 01/21/2013 confirms metastatic adenocarcinoma; scans show involvement of the liver, lungs, and likely bone.  (2) enrolled in [facility] study [id number] (docetaxel + oral gamma secretase inibitor [id number]), received one cycle starting 02/04/2013  but withdrew because of poor tolerance despite treatment interruptions and decreased dosing of the oral component  (3)  chest CT in early November 2014 was compared with studies in August 2014 and showed interval progression of metastatic breast cancer, with an enlarging primary right breast mass, enlarging right axillary lymph nodes, enlarging pulmonary nodules and new/enlarging hepatic metastases.  (4) Abraxane started 03/19/2013, given day 1 and day 8 of each 21 day cycle, stopped with the day 1 cycle 3 dose (04/29/2013) because of local progression of disease  (5) cyclophosphamide and doxorubicin started 05/20/2013, given in dose dense fashion  with Neulasta support on day 2. Completed 4 cycles 07/03/2013, with the final cycle dose reduced 15% because of an episode of febrile neutropenia after cycle 3. Restaging studies 07/15/2013 showed partial response.  (6) to start capecitabine 1.5 g by mouth twice a day 7 days on 7 days off in April  (7) epigastric pain with negative EGD 08/19/2013   PLAN: Were close to being able to start the capecitabine, but I am concerned by the abdominal pain that she is having. The EGD was unrevealing. But we're going to do first is just dry Naprosyn 440 mg 3 times a day with food, and she can add the tramadol to that if that's not sufficient. We're also doubling up on the Prilosec to 40 mg a day. She's got a call me 48 hours from now, on [phone], to let me  know the results. If she is out considerably better I will as radiation oncology to evaluate for possible palliative treatment, although looking at the CT scan of the abdomen we obtained in March I don't see an obvious target.  She has a good understanding of the possible toxicities, side effects and complications of capecitabine. She is going to be on a low dose, at least to start with, so hopefully it will be well tolerated. I am going to see her on may fifth, which should be the day she is starting her second cycle (she will be on and 7 days, off 7 days, starting April 20).  Once we have a definite starting day, we will obtain an MRI of the abdomen, since we need a new baseline to measure her response, and MRIs of course do not involve radiation.  In the meantime, I am referring Baker Janus to [place]. This way if the capecitabine fails to control her tumor, when we restage her in 2-3 weeks with a repeat MRI we may have uncovered a mutation we can target.  Gait is very much in agreement with this plan. She knows to call for any problems that may develop before next visit here.   Chauncey Cruel, MD    08/26/2013 4:13 PM

## 2013-08-26 NOTE — Telephone Encounter (Signed)
[facility] PT APPT SCHEDULE FOR MAY °

## 2013-08-29 ENCOUNTER — Other Ambulatory Visit: Payer: Self-pay | Admitting: *Deleted

## 2013-08-29 ENCOUNTER — Telehealth: Payer: Self-pay | Admitting: *Deleted

## 2013-08-29 DIAGNOSIS — C50411 Malignant neoplasm of upper-outer quadrant of right female breast: Secondary | ICD-10-CM

## 2013-08-29 NOTE — Telephone Encounter (Signed)
Pt called to this RN to report per MD request of status of abd discomfort post recommendations given at visit on 4/14.  Per Zigmund Daniel pain has decreased by about 50%. Appetite and taste are still greatly affected though patient " is working hard to eat ".  Above reviewed with MD who suggest for pt to increase the tramadol over the weekend. Referral will be made to [place] for consult for possible palliative radiation.  Marland Kitchen

## 2013-09-01 ENCOUNTER — Other Ambulatory Visit: Payer: Self-pay | Admitting: *Deleted

## 2013-09-01 ENCOUNTER — Telehealth: Payer: Self-pay | Admitting: *Deleted

## 2013-09-01 MED ORDER — TRAMADOL HCL 50 MG PO TABS: 100.0000 mg | ORAL_TABLET | Freq: Four times a day (QID) | ORAL | Status: DC | PRN

## 2013-09-01 NOTE — Progress Notes (Signed)
Location of Breast Cancer: Triple negative right breast cancer. Mass in upper outer quadrant measured 5.7 cm.  Histology per Pathology Report: Axillary lymph node biopsy 12/23/2012 Invasive ductal carcinoma grade 3, triple negative  01/21/2013 Diagnosis Liver, needle/core biopsy, right - POSITIVE FOR METASTATIC CARCINOMA, SEE COMMENT.  Receptor Status: ER(-), PR (-), Her2-neu (-)  Did patient present with symptoms (if so, please note symptoms) or was this found on screening mammography?: Patient noted mass in right breast in December 2013  Past/Anticipated interventions by surgeon, if any: none  Past/Anticipated interventions by medical oncology, if any: Chemotherapy:4 cycles cyclophosphamide and doxorubicin (had partial response). To start cap orally for twice daily for 7 days on and 7 days off April 2015.  Lymphedema issues, if any: no  Pain issues, if any: abdominal pain well controlled w/Tramadol 100 mg every 6 hours.  SAFETY ISSUES:  Prior radiation? No  Pacemaker/ICD? No  Possible current pregnancy? No  Is the patient on methotrexate? No  Current Complaints / other details: Married, [place] P2, menarche age 19, first live birth at 28.Post menopausal since 2008.No HRT.took birth control for 22 years. Father: bladder cancer Grandmother: intestinal cancer?  Pt being seen for possible palliative radiation to liver lesion. Nausea controlled w/Zofran 8 mg prn. Loss of appetite but improving slightly. Occasional constipation. Fatigued.    Carrie Edison Nasuti, RN 09/01/2013,12:20 PM

## 2013-09-01 NOTE — Telephone Encounter (Signed)
This RN spoke with pt per her call with update of symptoms post increasing dose of tramadol. Rhetta states " pain is gone with increasing the tramadol and I am able to eat better ".  She states she still has altered taste but can tolerate eating better in general.  This note will be given to MD for review.  Of note pt will need a refill on the tramadol - and will not be coming in to [facility] until appointment on Thursday with Rad Onc.

## 2013-09-04 ENCOUNTER — Encounter: Payer: Self-pay | Admitting: Radiation Oncology

## 2013-09-04 ENCOUNTER — Ambulatory Visit
Admission: RE | Admit: 2013-09-04 | Discharge: 2013-09-04 | Disposition: A | Discharge status: Home / Self Care | Payer: BC Managed Care – PPO | Source: Ambulatory Visit | Attending: Radiation Oncology | Admitting: Radiation Oncology

## 2013-09-04 VITALS — BP 114/71 | HR 98 | Temp 98.5°F | Resp 20 | Ht 64.0 in | Wt 120.6 lb

## 2013-09-04 DIAGNOSIS — K5900 Constipation, unspecified: Secondary | ICD-10-CM | POA: Insufficient documentation

## 2013-09-04 DIAGNOSIS — C50419 Malignant neoplasm of upper-outer quadrant of unspecified female breast: Secondary | ICD-10-CM | POA: Insufficient documentation

## 2013-09-04 DIAGNOSIS — C50411 Malignant neoplasm of upper-outer quadrant of right female breast: Secondary | ICD-10-CM

## 2013-09-04 DIAGNOSIS — Z79899 Other long term (current) drug therapy: Secondary | ICD-10-CM | POA: Insufficient documentation

## 2013-09-04 DIAGNOSIS — R1013 Epigastric pain: Secondary | ICD-10-CM | POA: Insufficient documentation

## 2013-09-04 DIAGNOSIS — C787 Secondary malignant neoplasm of liver and intrahepatic bile duct: Secondary | ICD-10-CM

## 2013-09-04 DIAGNOSIS — R918 Other nonspecific abnormal finding of lung field: Secondary | ICD-10-CM | POA: Insufficient documentation

## 2013-09-04 DIAGNOSIS — R197 Diarrhea, unspecified: Secondary | ICD-10-CM | POA: Insufficient documentation

## 2013-09-04 HISTORY — DX: Malignant neoplasm of unspecified site of unspecified female breast: C50.919

## 2013-09-04 HISTORY — DX: Secondary malignant neoplasm of unspecified site: C79.9

## 2013-09-07 NOTE — Progress Notes (Signed)
Department of Radiation Oncology  Phone:  [phone] Fax:        [phone]   Name: Carrie Berry MRN: [id number]  DOB: [birth year]  Date: 09/04/2013  Follow Up Visit Note  Diagnosis: Metastatic Breast Cancer   Interval History: Carrie Berry presents today for followup.  Her in the breast clinic last summer when she was diagnosed with a triple negative breast cancer. She was unfortunately found to have metastatic disease. This was documented on September 9 when she had a liver biopsy which was positive for metastatic adenocarcinoma. She completed 4 cycles of a.c. in March. She unfortunately has continued to have problems with epigastric pain which is quite debilitating in nature. This is associated with nausea and decreased appetite. She states that sometimes lost all day. She alternates between diarrhea and constipation. She doesn't have any fever or vomiting. No bloody diarrhea. She has had no issues with urination. She actually underwent EGD earlier this month which was negative for any sort of inflammation, ulcers, or Barrett's esophagus. She is on the omeprazole twice a day. She was recently started on tramadol 100 mg 4 times a day by Dr. [name] area and this seems to be adequately controlling her pain. Most recent staging studies were performed on March 3 which showed a response to treatment in her pulmonary nodules and breast mass. This also showed response to treatment within her liver. She has 2 lesions within her liver neither of which is close to the liver capsule. There is no evidence of any imaging abnormalities in the region of her gastroesophageal junction which is the area she localizes her pain to.  Allergies:  Allergies  Allergen Reactions  . Ondansetron Hcl Other (See Comments)    HEADACHE, patient states she still takes   . Codeine Nausea And Vomiting    "violently ill"  . Penicillins Rash    Childhood reaction -    Medications:  Current Outpatient Prescriptions    Medication Sig Dispense Refill  . acetaminophen (TYLENOL) 325 MG tablet Take 650 mg by mouth every 8 (eight) hours as needed for pain.      Marland Kitchen aspirin-acetaminophen-caffeine (EXCEDRIN MIGRAINE) 250-250-65 MG per tablet Take 1 tablet by mouth every 6 (six) hours as needed for headache.      . capecitabine (XELODA) 500 MG tablet Take 3 tablets (1,500 mg total) by mouth 2 (two) times daily after a meal.  84 tablet  4  . lidocaine-prilocaine (EMLA) cream Apply topically as needed.  30 g  3  . LORazepam (ATIVAN) 0.5 MG tablet Take 1 tablet (0.5 mg total) by mouth every 8 (eight) hours as needed for anxiety.  15 tablet  0  . omeprazole (PRILOSEC) 20 MG capsule TAKE 1 CAPSULE (20 MG TOTAL) BY MOUTH 2 (TWO) TIMES DAILY.  60 capsule  1  . ondansetron (ZOFRAN) 8 MG tablet Take 8 mg by mouth every 8 (eight) hours as needed for nausea or vomiting.      Marland Kitchen PATADAY 0.2 % SOLN Place 1 drop into both eyes daily as needed (dry eyes).       . prochlorperazine (COMPAZINE) 10 MG tablet Take 1 tablet (10 mg total) by mouth every 6 (six) hours as needed for nausea.  30 tablet  2  . promethazine (PHENERGAN) 25 MG tablet       . tobramycin-dexamethasone (TOBRADEX) ophthalmic solution Place 1 drop into both eyes daily.  5 mL  1  . traMADol (ULTRAM) 50 MG tablet Take 2 tablets (100  mg total) by mouth every 6 (six) hours as needed for moderate pain.  120 tablet  0  . traZODone (DESYREL) 50 MG tablet TAKE 1 TO 2 TABLETS AT BEDTIME FOR SLEEP  60 tablet  1  . triamcinolone (NASACORT) 55 MCG/ACT nasal inhaler Place 2 sprays into the nose daily.      Marland Kitchen zolpidem (AMBIEN) 5 MG tablet TAKE 1 TABLET AT BEDTIME AS NEEDED FOR SLEEP  20 tablet  0   No current facility-administered medications for this encounter.    Physical Exam:  Filed Vitals:   09/04/13 0815  BP: 114/71  Pulse: 98  Temp: 98.5 F (36.9 C)  Resp: 20  Height: 5\' 4"  (1.626 m)  Weight: 120 lb 9.6 oz (54.704 kg)   pleasant female in no distress sitting  comfortably examining table. Alert minus x3. Some tenderness in the epigastric region.  IMPRESSION: Carrie Berry is a 63 y.o. female with metastatic triple negative breast cancer and epigastric pain with unclear etiology  PLAN:  I went over her case CT scans with her. Unfortunately I don't think there is a target for radiation. At the liver lesion that she had is getting smaller and is nontender the capsule which would be a source of epigastric pain. There is nothing I can see in the region of the celiac axis with the gastroesophageal junction which may be the source of her pain. Other than exploratory laparotomy which may cause more problems than it solves to investigate whether she has micrometastatic disease, there is really nothing to do for my standpoint unfortunately. We had a long discussion about this. She has followup scheduled with Dr. [name].    Thea Silversmith, MD

## 2013-09-08 ENCOUNTER — Other Ambulatory Visit: Payer: Self-pay | Admitting: Oncology

## 2013-09-09 ENCOUNTER — Other Ambulatory Visit: Payer: Self-pay | Admitting: *Deleted

## 2013-09-11 ENCOUNTER — Encounter: Payer: Self-pay | Admitting: *Deleted

## 2013-09-11 NOTE — Progress Notes (Signed)
RECEIVED A FAX FROM [facility] CONCERNING A CONCERNING A CONFIRMATION OF PRESCRIPTION SHIPMENT FOR CAPECITABINE ON 09/10/13.

## 2013-09-15 ENCOUNTER — Ambulatory Visit (HOSPITAL_BASED_OUTPATIENT_CLINIC_OR_DEPARTMENT_OTHER): Payer: BC Managed Care – PPO | Admitting: Oncology

## 2013-09-15 ENCOUNTER — Other Ambulatory Visit (HOSPITAL_BASED_OUTPATIENT_CLINIC_OR_DEPARTMENT_OTHER): Payer: BC Managed Care – PPO

## 2013-09-15 ENCOUNTER — Telehealth: Payer: Self-pay | Admitting: Oncology

## 2013-09-15 VITALS — BP 121/73 | HR 86 | Temp 98.5°F | Resp 20 | Ht 64.0 in | Wt 121.7 lb

## 2013-09-15 DIAGNOSIS — C50419 Malignant neoplasm of upper-outer quadrant of unspecified female breast: Secondary | ICD-10-CM

## 2013-09-15 DIAGNOSIS — C7800 Secondary malignant neoplasm of unspecified lung: Secondary | ICD-10-CM

## 2013-09-15 DIAGNOSIS — C50919 Malignant neoplasm of unspecified site of unspecified female breast: Secondary | ICD-10-CM

## 2013-09-15 DIAGNOSIS — C787 Secondary malignant neoplasm of liver and intrahepatic bile duct: Secondary | ICD-10-CM

## 2013-09-15 DIAGNOSIS — C50411 Malignant neoplasm of upper-outer quadrant of right female breast: Secondary | ICD-10-CM

## 2013-09-15 DIAGNOSIS — Z171 Estrogen receptor negative status [ER-]: Secondary | ICD-10-CM

## 2013-09-15 DIAGNOSIS — C773 Secondary and unspecified malignant neoplasm of axilla and upper limb lymph nodes: Secondary | ICD-10-CM

## 2013-09-15 LAB — CBC WITH DIFFERENTIAL/PLATELET
BASO%: 0.5 % (ref 0.0–2.0)
Basophils Absolute: 0 10*3/uL (ref 0.0–0.1)
EOS%: 7.5 % — ABNORMAL HIGH (ref 0.0–7.0)
Eosinophils Absolute: 0.5 10*3/uL (ref 0.0–0.5)
HEMATOCRIT: 34.9 % (ref 34.8–46.6)
HGB: 11.7 g/dL (ref 11.6–15.9)
LYMPH%: 12.2 % — AB (ref 14.0–49.7)
MCH: 32 pg (ref 25.1–34.0)
MCHC: 33.7 g/dL (ref 31.5–36.0)
MCV: 95.2 fL (ref 79.5–101.0)
MONO#: 0.5 10*3/uL (ref 0.1–0.9)
MONO%: 7.5 % (ref 0.0–14.0)
NEUT#: 4.4 10*3/uL (ref 1.5–6.5)
NEUT%: 72.3 % (ref 38.4–76.8)
PLATELETS: 203 10*3/uL (ref 145–400)
RBC: 3.66 10*6/uL — ABNORMAL LOW (ref 3.70–5.45)
RDW: 13.5 % (ref 11.2–14.5)
WBC: 6.1 10*3/uL (ref 3.9–10.3)
lymph#: 0.7 10*3/uL — ABNORMAL LOW (ref 0.9–3.3)

## 2013-09-15 NOTE — Progress Notes (Signed)
. Patient ID: Carrie Berry, female   DOB: 06/27/1950, 63 y.o.   MRN: [id number] ID: [name] OB: 1950/08/13  MR#: [id number]  CSN#:[id number]  PCP: [name], MD GYN:   SU: [name] OTHER MD: Thea Silversmith, Earle Gell, Christene Slates, Erline Levine  CHIEF COMPLAINT:  Metastatic Breast Cancer   BREAST CANCER HISTORY:  Kerisha noted a mass in her right breast December of 2013, but did not think much of it. It did grow some lower the summer, but she was keeping her grandchild at that time and was too busy so she did not bring it to Dr. Bernell List attention until August. He set her up for bilateral mammography and ultrasonography at [facility] 12/18/2012 and this measured, on the right, a large irregular lobulated mass in the upper outer quadrant which by ultrasound measured 5.7 cm. The right axilla showed some lymph nodes with thickened cortices. In the left breast there was a focal asymmetry at the depth, lateral to the nipple, and by ultrasound there was a 9 mm ill-defined hypoechoic lesion in this location. Biopsy of the left breast lesion showed only fibrocystic changes.  Biopsy of the right breast mass and right axillary adenopathy ([id number]) on 12/23/2012 showed both to be involved by invasive ductal carcinoma, grade 2, triple negative, with an MIB-1 of 86%.  MRI of the breast 12/27/2012 at [facility] imaging showed in the right breast a mass abutting the pectoralis muscle without enhancement of the muscle measuring 4.8 cm. The satellite nodule measuring approximately 3 mm was also noted superior to the mass and several abnormal and enlarged right axillary lymph nodes were noted, one of which appeared to be necrotic. The largest node measured 2.0 cm. Unfortunately, numerous bilateral pulmonary nodules were also noted.  The patient's subsequent history is as detailed below   INTERVAL HISTORY: [name] returns today for followup of her metastatic breast cancer. Today is day 1 cycle 2 of  every 14 day capecitabine (she takes 3 tablets twice daily one week on , one week off).. Since her last visit here she met with Dr. Pablo Ledger to see if there was a target for radiation that might alleviate her epigastric pain. Dr. Pablo Ledger carefully reviewed all the films and really there wasn't a target. However, since that time, for approximately the last week or so, the patient's epigastric pain has gone. This is likely due to the Prilosec. Most likely she had erosions which are now healing. I think it would be too early to give capecitabine the credit for the improvement in symptoms. She has stopped all pain medicines.  REVIEW OF SYSTEMS Carrie Berry is tolerating the capecitabine remarkably well. She has had no diarrhea, no rash, and no mouth sores. Her eyes are a little dry and she was started on TobraDex eyedrops, which is helping. As stated, her epigastric discomfort has resolved now for approximately a week. Her appetite is better although she still has disease progression. Her energy is better as well. She walked 3 miles a couple of days ago. She did some clearing on her land and of course a lot of mowing. She had minimal nausea on day 5 of cycle 1, but she did not take antinausea medicine for that and did not have any vomiting. A detailed review of systems today was otherwise noncontributory  PAST MEDICAL HISTORY: Past Medical History  Diagnosis Date  . Recurrent sinus infections   . Arthritis   . PONV (postoperative nausea and vomiting)   . Anxiety   .  GERD (gastroesophageal reflux disease)   . 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 rt breast ca dx'd 12/2011    breast  . Breast cancer   . 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 Tubal ligation    . Tubal ligation  1985  . Portacath placement N/A 01/10/2013    Procedure: ULTRASOUND GUIDED PORT-A-CATH INSERTION WITH  FLUOROSCOPY;  Surgeon: [name], MD;  Location: [place];  Service: General;  Laterality: N/A;  . Esophagogastroduodenoscopy N/A 08/19/2013    Procedure: ESOPHAGOGASTRODUODENOSCOPY (EGD);  Surgeon: Garlan Fair, MD;  Location: [facility];  Service: Endoscopy;  Laterality: N/A;    FAMILY HISTORY Family History  Problem Relation Age of Onset  . Bladder Cancer Father   . Colon cancer Maternal Grandmother    the patient's parents are living, both in their 66s. The patient's father was diagnosed with bladder cancer the age of 78. The patient's mother's mother had some type of gastrointestinal cancer. The patient had one brother, no sisters. There is no history of breast or ovarian cancer in the family other than a cousin on the father's side who was diagnosed with breast cancer apparently before the age of 72.  GYNECOLOGIC HISTORY:  Menarche age 67, first live birth age 48, the patient is [place] P2. She went through menopause in 2008. She did not take hormone replacement. She took birth control for approximately 22 years remotely.  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 She's currently on disability. Her husband Raenell Mensing [name]"), is vice Development worker, international aid for [employer]. The patient's daughter Carrie Berry is a stay-at-home mom in [place], the patient's son Carrie Berry unfortunately died in an automobile accident in December of 2011. The patient has 2 grandchildren. She attends to a local [employer].    ADVANCED DIRECTIVES: Not in place   HEALTH MAINTENANCE: (Updated December 2014) History  Substance Use Topics  . Smoking status: Never Smoker   . Smokeless tobacco: Never Used  . Alcohol Use: No     Colonoscopy: 2005  PAP: Not on file  Bone density: Never  Lipid panel:  Dr. Delfina Redwood   Allergies  Allergen Reactions  . Ondansetron Hcl Other (See  Comments)    HEADACHE, patient states she still takes   . Codeine Nausea And Vomiting    "violently ill"  . Penicillins Rash    Childhood reaction -    Current Outpatient Prescriptions  Medication Sig Dispense Refill  . acetaminophen (TYLENOL) 325 MG tablet Take 650 mg by mouth every 8 (eight) hours as needed for pain.      Marland Kitchen aspirin-acetaminophen-caffeine (EXCEDRIN MIGRAINE) 250-250-65 MG per tablet Take 1 tablet by mouth every 6 (six) hours as needed for headache.      . capecitabine (XELODA) 500 MG tablet Take 3 tablets (1,500 mg total) by mouth 2 (two) times daily after a meal.  84 tablet  4  . lidocaine-prilocaine (EMLA) cream Apply topically as needed.  30 g  3  . LORazepam (ATIVAN) 0.5 MG tablet Take 1 tablet (0.5 mg total) by mouth every 8 (eight) hours as needed for anxiety.  15 tablet  0  . omeprazole (PRILOSEC) 20 MG capsule TAKE 1 CAPSULE (20 MG TOTAL) BY MOUTH 2 (TWO) TIMES DAILY.  60 capsule  1  .  ondansetron (ZOFRAN) 8 MG tablet Take 8 mg by mouth every 8 (eight) hours as needed for nausea or vomiting.      Marland Kitchen PATADAY 0.2 % SOLN Place 1 drop into both eyes daily as needed (dry eyes).       . prochlorperazine (COMPAZINE) 10 MG tablet Take 1 tablet (10 mg total) by mouth every 6 (six) hours as needed for nausea.  30 tablet  2  . promethazine (PHENERGAN) 25 MG tablet       . tobramycin-dexamethasone (TOBRADEX) ophthalmic solution Place 1 drop into both eyes daily.  5 mL  1  . traMADol (ULTRAM) 50 MG tablet Take 2 tablets (100 mg total) by mouth every 6 (six) hours as needed for moderate pain.  120 tablet  0  . traZODone (DESYREL) 50 MG tablet TAKE 1 TO 2 TABLETS AT BEDTIME FOR SLEEP  60 tablet  1  . triamcinolone (NASACORT) 55 MCG/ACT nasal inhaler Place 2 sprays into the nose daily.      Marland Kitchen zolpidem (AMBIEN) 5 MG tablet TAKE 1 TABLET AT BEDTIME AS NEEDED FOR SLEEP  20 tablet  0   No current facility-administered medications for this visit.    OBJECTIVE: Middle-aged white woman  in no acute distress Filed Vitals:   09/15/13 1016  BP: 121/73  Pulse: 86  Temp: 98.5 F (36.9 C)  Resp: 20     Body mass index is 20.88 kg/(m^2).    ECOG FS: 1 Filed Weights   09/15/13 1016  Weight: 121 lb 11.2 oz (55.203 kg)     Sclerae unicteric, EOMs intact Oropharynx clear, no thrush or other lesions No cervical or supraclavicular adenopathy Lungs no rales or rhonchi, auscultated anterolaterally Heart regular rate and rhythm Abd soft, positive bowel sounds, nontender to palpation all quadrants MSK no upper extremity lymphedema Neuro: nonfocal, well oriented, positive affect Breasts: Deferred  LAB RESULTS:   Lab Results  Component Value Date   WBC 6.1 09/15/2013   NEUTROABS 4.4 09/15/2013   HGB 11.7 09/15/2013   HCT 34.9 09/15/2013   MCV 95.2 09/15/2013   PLT 203 09/15/2013      Chemistry      Component Value Date/Time   NA 138 07/30/2013 1344   NA 135 07/01/2013 1022   K 4.0 07/30/2013 1344   K 4.1 07/01/2013 1022   CL 98 07/01/2013 1022   CO2 26 07/30/2013 1344   CO2 27 07/01/2013 1022   BUN 9.4 07/30/2013 1344   BUN 11 07/01/2013 1022   CREATININE 0.7 07/30/2013 1344   CREATININE 0.65 07/01/2013 1022      Component Value Date/Time   CALCIUM 9.0 07/30/2013 1344   CALCIUM 8.8 07/01/2013 1022   ALKPHOS 79 07/30/2013 1344   ALKPHOS 117 06/24/2013 2245   AST 27 07/30/2013 1344   AST 22 06/24/2013 2245   ALT 22 07/30/2013 1344   ALT 28 06/24/2013 2245   BILITOT 0.28 07/30/2013 1344   BILITOT 0.3 06/24/2013 2245        STUDIES: MRI HEAD WITHOUT AND WITH CONTRAST  TECHNIQUE:  Multiplanar, multiecho pulse sequences of the brain and surrounding  structures were obtained without and with intravenous contrast.  CONTRAST: 53mL MULTIHANCE GADOBENATE DIMEGLUMINE 529 MG/ML IV SOLN  COMPARISON: None.  FINDINGS:  No acute stroke or hemorrhage. No mass lesion or hydrocephalus. No  extra-axial fluid.  Mild cerebral and cerebellar atrophy. Mild subcortical and  periventricular T2 and  FLAIR hyperintensities, likely chronic  microvascular ischemic change. Flow voids are  maintained throughout  the carotid, basilar, and vertebral arteries. There are no areas of  chronic hemorrhage. Pituitary, pineal, and cerebellar tonsils  unremarkable. No upper cervical lesions.  Post infusion, there is no abnormal enhancement of the brain or  meninges. Visualized calvarium, skull base, and upper cervical  osseous structures unremarkable. Scalp and extracranial soft  tissues, orbits, sinuses, and mastoids show no acute process.  IMPRESSION:  Mild atrophy and small vessel disease. No acute intracranial  findings.  Electronically Signed  By: Rolla Flatten M.D.  On: 07/15/2013 16:31    ASSESSMENT: 63 y.o. [name], [place] woman status post right breast upper outer quadrant and right axillary lymph node biopsy 12/23/2012 for a clinical T3 N1 M1, stage IV invasive ductal carcinoma, grade 3, triple negative, with an MIB-1 of 86%.  (1) right liver lobe biopsy 01/21/2013 confirms metastatic adenocarcinoma; scans show involvement of the liver, lungs, and likely bone.  (2) enrolled in [facility] study [id number] (docetaxel + oral gamma secretase inibitor [id number]), received one cycle starting 02/04/2013  but withdrew because of poor tolerance despite treatment interruptions and decreased dosing of the oral component  (3)  chest CT in early November 2014 was compared with studies in August 2014 and showed interval progression of metastatic breast cancer, with an enlarging primary right breast mass, enlarging right axillary lymph nodes, enlarging pulmonary nodules and new/enlarging hepatic metastases.  (4) Abraxane started 03/19/2013, given day 1 and day 8 of each 21 day cycle, stopped with the day 1 cycle 3 dose (04/29/2013) because of local progression of disease  (5) cyclophosphamide and doxorubicin started 05/20/2013, given in dose dense fashion  with Neulasta support on day 2. Completed 4 cycles 07/03/2013,  with the final cycle dose reduced 15% because of an episode of febrile neutropenia after cycle 3. Restaging studies 07/15/2013 showed partial response.  (6) to start capecitabine 1.5 g by mouth twice a day 7 days on 7 days off in April  (7) epigastric pain with negative EGD 08/19/2013   PLAN: She is tolerating the capecitabine fine. We again reviewed questions regarding rash, diarrhea, and mouth sores, but so far she has not had any of those problems. Her epigastric discomfort has resolved, likeLY due to the Prilosec. I have asked her to continue that nightly for at least the next 3 months. She probably had actual ulcers and not to his gastritis. Her dry eyes are much better on her current drops.  We will check her labwork every 2 weeks, on day one of each 14 day cycle, and she will see [place] every 28 days. I have entered those labs of appointments.  We discussed the fact that we will need something to measure and an MRI of the liver is the best baseline to obtain at this point, in my opinion, since it involves no radiation and we can get very accurate measurements. Accordingly she will have a liver MRI sometime this week and we will repeat that in approximately 2-1/2 for 3 months to assess for response.  Sari has a good understanding of this plan. She agrees with it. She knows the goal of treatment in her case is control. She will call with any problems that may develop before her next visit here.   Chauncey Cruel, MD    09/15/2013 10:42 AM

## 2013-09-15 NOTE — Telephone Encounter (Signed)
per pof to please sch appt/image sch sent email to [facility] to write pof/will call pt

## 2013-09-16 ENCOUNTER — Telehealth: Payer: Self-pay | Admitting: *Deleted

## 2013-09-16 ENCOUNTER — Other Ambulatory Visit: Payer: Self-pay | Admitting: *Deleted

## 2013-09-16 ENCOUNTER — Other Ambulatory Visit: Payer: Self-pay | Admitting: Oncology

## 2013-09-16 DIAGNOSIS — C50919 Malignant neoplasm of unspecified site of unspecified female breast: Secondary | ICD-10-CM

## 2013-09-16 MED ORDER — DIAZEPAM 5 MG PO TABS: ORAL_TABLET | ORAL | Status: DC

## 2013-09-16 NOTE — Telephone Encounter (Signed)
Message left by central scheduling stating pt is scheduled for MRI but is noted as being " claustophobic ".  " they do not do sedation so I am unsure what you want to give her "  Message left by [place].  This RN called to pt's home number and left message for a return call to discuss above and what medication works best for her - ativan or valium.  This RN name and office number given for a return call.

## 2013-09-17 NOTE — Telephone Encounter (Signed)
Valium Rx controlled substance, did not e-scribe, verbally called to Pharmacy. Pharmacy will notify patient.

## 2013-09-24 ENCOUNTER — Other Ambulatory Visit: Payer: Self-pay | Admitting: Oncology

## 2013-09-24 ENCOUNTER — Ambulatory Visit (HOSPITAL_COMMUNITY)
Admission: RE | Admit: 2013-09-24 | Discharge: 2013-09-24 | Disposition: A | Discharge status: Home / Self Care | Payer: BC Managed Care – PPO | Source: Ambulatory Visit | Attending: Oncology | Admitting: Oncology

## 2013-09-24 DIAGNOSIS — C787 Secondary malignant neoplasm of liver and intrahepatic bile duct: Secondary | ICD-10-CM | POA: Insufficient documentation

## 2013-09-24 DIAGNOSIS — C50919 Malignant neoplasm of unspecified site of unspecified female breast: Secondary | ICD-10-CM

## 2013-09-24 LAB — POCT I-STAT CREATININE: Creatinine, Ser: 1 mg/dL (ref 0.50–1.10)

## 2013-09-24 MED ORDER — GADOBENATE DIMEGLUMINE 529 MG/ML IV SOLN
15.0000 mL | Freq: Once | INTRAVENOUS | Status: AC | PRN
  Administered 2013-09-24: 11 mL via INTRAVENOUS

## 2013-09-29 ENCOUNTER — Other Ambulatory Visit (HOSPITAL_BASED_OUTPATIENT_CLINIC_OR_DEPARTMENT_OTHER): Payer: BC Managed Care – PPO

## 2013-09-29 DIAGNOSIS — C50919 Malignant neoplasm of unspecified site of unspecified female breast: Secondary | ICD-10-CM

## 2013-09-29 DIAGNOSIS — C50419 Malignant neoplasm of upper-outer quadrant of unspecified female breast: Secondary | ICD-10-CM

## 2013-09-29 DIAGNOSIS — C50411 Malignant neoplasm of upper-outer quadrant of right female breast: Secondary | ICD-10-CM

## 2013-09-29 LAB — CBC WITH DIFFERENTIAL/PLATELET
BASO%: 0.6 % (ref 0.0–2.0)
BASOS ABS: 0 10*3/uL (ref 0.0–0.1)
EOS%: 8.2 % — AB (ref 0.0–7.0)
Eosinophils Absolute: 0.5 10*3/uL (ref 0.0–0.5)
HCT: 35.8 % (ref 34.8–46.6)
HGB: 12.1 g/dL (ref 11.6–15.9)
LYMPH%: 13.7 % — ABNORMAL LOW (ref 14.0–49.7)
MCH: 32.3 pg (ref 25.1–34.0)
MCHC: 33.9 g/dL (ref 31.5–36.0)
MCV: 95.2 fL (ref 79.5–101.0)
MONO#: 0.5 10*3/uL (ref 0.1–0.9)
MONO%: 7.8 % (ref 0.0–14.0)
NEUT#: 4.2 10*3/uL (ref 1.5–6.5)
NEUT%: 69.7 % (ref 38.4–76.8)
Platelets: 222 10*3/uL (ref 145–400)
RBC: 3.76 10*6/uL (ref 3.70–5.45)
RDW: 14 % (ref 11.2–14.5)
WBC: 6.1 10*3/uL (ref 3.9–10.3)
lymph#: 0.8 10*3/uL — ABNORMAL LOW (ref 0.9–3.3)

## 2013-09-29 LAB — COMPREHENSIVE METABOLIC PANEL (CC13)
ALK PHOS: 99 U/L (ref 40–150)
ALT: 19 U/L (ref 0–55)
AST: 22 U/L (ref 5–34)
Albumin: 3.9 g/dL (ref 3.5–5.0)
Anion Gap: 11 mEq/L (ref 3–11)
BUN: 19.3 mg/dL (ref 7.0–26.0)
CALCIUM: 9.4 mg/dL (ref 8.4–10.4)
CHLORIDE: 106 meq/L (ref 98–109)
CO2: 24 mEq/L (ref 22–29)
CREATININE: 0.8 mg/dL (ref 0.6–1.1)
Glucose: 92 mg/dl (ref 70–140)
POTASSIUM: 4.1 meq/L (ref 3.5–5.1)
Sodium: 142 mEq/L (ref 136–145)
Total Bilirubin: 0.35 mg/dL (ref 0.20–1.20)
Total Protein: 6.9 g/dL (ref 6.4–8.3)

## 2013-10-10 ENCOUNTER — Other Ambulatory Visit: Payer: Self-pay | Admitting: Oncology

## 2013-10-10 DIAGNOSIS — C50919 Malignant neoplasm of unspecified site of unspecified female breast: Secondary | ICD-10-CM

## 2013-10-12 ENCOUNTER — Other Ambulatory Visit: Payer: Self-pay | Admitting: Oncology

## 2013-10-12 NOTE — Progress Notes (Unsigned)
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 This suggests she may benefit particularly from a PARPP inhibitor (like Olaparib, if we can get it for her) and/or platinum

## 2013-10-13 ENCOUNTER — Ambulatory Visit (HOSPITAL_BASED_OUTPATIENT_CLINIC_OR_DEPARTMENT_OTHER): Payer: BC Managed Care – PPO | Admitting: Physician Assistant

## 2013-10-13 ENCOUNTER — Encounter: Payer: Self-pay | Admitting: Physician Assistant

## 2013-10-13 ENCOUNTER — Other Ambulatory Visit (HOSPITAL_BASED_OUTPATIENT_CLINIC_OR_DEPARTMENT_OTHER): Payer: BC Managed Care – PPO

## 2013-10-13 ENCOUNTER — Telehealth: Payer: Self-pay | Admitting: Oncology

## 2013-10-13 VITALS — BP 123/73 | HR 81 | Temp 99.6°F | Resp 18 | Ht 64.0 in | Wt 119.9 lb

## 2013-10-13 DIAGNOSIS — C50419 Malignant neoplasm of upper-outer quadrant of unspecified female breast: Secondary | ICD-10-CM

## 2013-10-13 DIAGNOSIS — Z452 Encounter for adjustment and management of vascular access device: Secondary | ICD-10-CM

## 2013-10-13 DIAGNOSIS — C7800 Secondary malignant neoplasm of unspecified lung: Secondary | ICD-10-CM

## 2013-10-13 DIAGNOSIS — C787 Secondary malignant neoplasm of liver and intrahepatic bile duct: Secondary | ICD-10-CM

## 2013-10-13 DIAGNOSIS — K219 Gastro-esophageal reflux disease without esophagitis: Secondary | ICD-10-CM

## 2013-10-13 DIAGNOSIS — C773 Secondary and unspecified malignant neoplasm of axilla and upper limb lymph nodes: Secondary | ICD-10-CM

## 2013-10-13 DIAGNOSIS — G4700 Insomnia, unspecified: Secondary | ICD-10-CM | POA: Insufficient documentation

## 2013-10-13 DIAGNOSIS — C50919 Malignant neoplasm of unspecified site of unspecified female breast: Secondary | ICD-10-CM

## 2013-10-13 DIAGNOSIS — C50411 Malignant neoplasm of upper-outer quadrant of right female breast: Secondary | ICD-10-CM

## 2013-10-13 DIAGNOSIS — Z171 Estrogen receptor negative status [ER-]: Secondary | ICD-10-CM

## 2013-10-13 DIAGNOSIS — R21 Rash and other nonspecific skin eruption: Secondary | ICD-10-CM

## 2013-10-13 DIAGNOSIS — H04209 Unspecified epiphora, unspecified lacrimal gland: Secondary | ICD-10-CM

## 2013-10-13 LAB — CBC WITH DIFFERENTIAL/PLATELET
BASO%: 0.2 % (ref 0.0–2.0)
BASOS ABS: 0 10*3/uL (ref 0.0–0.1)
EOS ABS: 0.4 10*3/uL (ref 0.0–0.5)
EOS%: 4.6 % (ref 0.0–7.0)
HCT: 34.5 % — ABNORMAL LOW (ref 34.8–46.6)
HEMOGLOBIN: 11.7 g/dL (ref 11.6–15.9)
LYMPH%: 12.3 % — ABNORMAL LOW (ref 14.0–49.7)
MCH: 32.1 pg (ref 25.1–34.0)
MCHC: 33.9 g/dL (ref 31.5–36.0)
MCV: 94.8 fL (ref 79.5–101.0)
MONO#: 0.9 10*3/uL (ref 0.1–0.9)
MONO%: 10.4 % (ref 0.0–14.0)
NEUT%: 72.5 % (ref 38.4–76.8)
NEUTROS ABS: 6.5 10*3/uL (ref 1.5–6.5)
Platelets: 196 10*3/uL (ref 145–400)
RBC: 3.64 10*6/uL — ABNORMAL LOW (ref 3.70–5.45)
RDW: 15.8 % — ABNORMAL HIGH (ref 11.2–14.5)
WBC: 9 10*3/uL (ref 3.9–10.3)
lymph#: 1.1 10*3/uL (ref 0.9–3.3)

## 2013-10-13 LAB — COMPREHENSIVE METABOLIC PANEL (CC13)
ALK PHOS: 105 U/L (ref 40–150)
ALT: 31 U/L (ref 0–55)
AST: 23 U/L (ref 5–34)
Albumin: 3.9 g/dL (ref 3.5–5.0)
Anion Gap: 12 mEq/L — ABNORMAL HIGH (ref 3–11)
BUN: 16.5 mg/dL (ref 7.0–26.0)
CALCIUM: 9.2 mg/dL (ref 8.4–10.4)
CHLORIDE: 105 meq/L (ref 98–109)
CO2: 23 mEq/L (ref 22–29)
CREATININE: 0.8 mg/dL (ref 0.6–1.1)
Glucose: 83 mg/dl (ref 70–140)
Potassium: 4.1 mEq/L (ref 3.5–5.1)
Sodium: 141 mEq/L (ref 136–145)
Total Bilirubin: 0.48 mg/dL (ref 0.20–1.20)
Total Protein: 7 g/dL (ref 6.4–8.3)

## 2013-10-13 MED ORDER — HEPARIN SOD (PORK) LOCK FLUSH 100 UNIT/ML IV SOLN
500.0000 [IU] | Freq: Once | INTRAVENOUS | Status: AC
  Administered 2013-10-13: 500 [IU] via INTRAVENOUS
  Filled 2013-10-13: qty 5

## 2013-10-13 MED ORDER — SODIUM CHLORIDE 0.9 % IJ SOLN
10.0000 mL | INTRAMUSCULAR | Status: DC | PRN
  Administered 2013-10-13: 10 mL via INTRAVENOUS
  Filled 2013-10-13: qty 10

## 2013-10-13 NOTE — Telephone Encounter (Signed)
, °

## 2013-10-13 NOTE — Progress Notes (Signed)
. Patient ID: YAILENE BADIA, female   DOB: Oct 10, 1950, 63 y.o.   MRN: [id number] ID: [name] OB: 09-27-1950  MR#: [id number]  CSN#:[id number]  PCP: [name], MD GYN:   SU: [name] OTHER MD: Thea Silversmith, Earle Gell, Christene Slates, Erline Levine  CHIEF COMPLAINT:  Metastatic Breast Cancer   BREAST CANCER HISTORY:  Joscelin noted a mass in her right breast December of 2013, but did not think much of it. It did grow some lower the summer, but she was keeping her grandchild at that time and was too busy so she did not bring it to Dr. Bernell List attention until August. He set her up for bilateral mammography and ultrasonography at [facility] 12/18/2012 and this measured, on the right, a large irregular lobulated mass in the upper outer quadrant which by ultrasound measured 5.7 cm. The right axilla showed some lymph nodes with thickened cortices. In the left breast there was a focal asymmetry at the depth, lateral to the nipple, and by ultrasound there was a 9 mm ill-defined hypoechoic lesion in this location. Biopsy of the left breast lesion showed only fibrocystic changes.  Biopsy of the right breast mass and right axillary adenopathy ([id number]) on 12/23/2012 showed both to be involved by invasive ductal carcinoma, grade 2, triple negative, with an MIB-1 of 86%.  MRI of the breast 12/27/2012 at [facility] showed in the right breast a mass abutting the pectoralis muscle without enhancement of the muscle measuring 4.8 cm. The satellite nodule measuring approximately 3 mm was also noted superior to the mass and several abnormal and enlarged right axillary lymph nodes were noted, one of which appeared to be necrotic. The largest node measured 2.0 cm. Unfortunately, numerous bilateral pulmonary nodules were also noted.  The patient's subsequent history is as detailed below   INTERVAL HISTORY: Malayah returns alone today for followup of her metastatic breast cancer. She is currently on  capecitabine, 3 tablets twice daily, one week on and one-week off. Today is day 1 cycle 4, and she started her pills this morning.   Overall, Deira is tolerating the treatment well. She does begin have increased nausea by the end of week 1, but this improves when she discontinues the pill on day 8. In the past, she had problems with chronic constipation. This is no longer a problem, and she is having loose or bowel movements, averaging approximately 3 daily. She has brief abdominal pain which is relieved immediately with a bowel movement. There is no blood or mucus in the stool. She describes it as loose but not watery, and certainly not excessive at this point. This also seems to occur near the end of her 7 day cycle of medication.   Raziah has a slight rash on her forearms bilaterally. It is consistent of small red bumps, but there are neither pustules nor vesicles. It does itch. It is isolated to the sun exposed areas, and she does think it got worse after mowing the yard, despite using a good sun screen. The palms of her hands and soles of her feet are fine, with no skin changes, redness, pain, cracking, or peeling. She also denies any mouth ulcers or oral sensitivity.  Interval history is also notable for a liver MRI on 09/24/2013 for a new baseline. This showed a significant decrease in the lateral right breast lesion, down from 5.7 cm to 2.6 cm. There were scattered lesions in the right hepatic lobe, the largest of which was 8 mm.  REVIEW OF SYSTEMS Blanchie denies any recent illnesses and has had no fevers or chills. She continues to have problems sleeping, and alternates between trazodone and Ambien with good results. Her energy level is good, and she is exercising on a regular basis. (Her goal is to return to work when school starts in the fall.) Her appetite is fair. Despite the occasional nausea, she has had no emesis. She did have some epigastric pain which has improved with Prilosec.  She's had no  change in urinary habits and denies dysuria or hematuria. She's keeping herself well hydrated. She's had no abnormal headaches, dizziness, or change in vision. She continues on TobraDex eyedrops which is helping with her dry eyes. She denies any increased cough, phlegm production, shortness of breath, peripheral swelling, chest pain, or palpitations. The pain in the right breast and right axillary region has completely resolved, and currently, she denies any unusual myalgias, arthralgias, or bony pain.  A detailed review of systems is otherwise stable and noncontributory.   PAST MEDICAL HISTORY: Past Medical History  Diagnosis Date  . Recurrent sinus infections   . Arthritis   . PONV (postoperative nausea and vomiting)   . Anxiety   . GERD (gastroesophageal reflux disease)   . 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 rt breast ca dx'd 12/2011    breast  . Breast cancer   . 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 Tubal ligation    . Tubal ligation  1985  . Portacath placement N/A 01/10/2013    Procedure: ULTRASOUND GUIDED PORT-A-CATH INSERTION WITH FLUOROSCOPY;  Surgeon: [name], MD;  Location: [facility];  Service: General;  Laterality: N/A;  . Esophagogastroduodenoscopy N/A 08/19/2013    Procedure: ESOPHAGOGASTRODUODENOSCOPY (EGD);  Surgeon: Garlan Fair, MD;  Location: [facility];  Service: Endoscopy;  Laterality: N/A;    FAMILY HISTORY Family History  Problem Relation Age of Onset  . Bladder Cancer Father   . Colon cancer Maternal Grandmother    the patient's parents are living, both in their 2s. The patient's father was diagnosed with bladder cancer the age of 28. The patient's mother's mother had some type of gastrointestinal cancer. The patient had one brother, no sisters. There is no history of breast or ovarian cancer in the family other than a  cousin on the father's side who was diagnosed with breast cancer apparently before the age of 76.  GYNECOLOGIC HISTORY:   (Reviewed 10/13/2013) Menarche age 79, first live birth age 47, the patient is [place] P2. She went through menopause in 2008. She did not take hormone replacement. She took birth control for approximately 22 years remotely.  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 She's currently on disability. Her husband Kyandra Mcclaine [name]"), is vice Development worker, international aid for [employer]. The patient's daughter [name] is a stay-at-home mom in [place], the patient's son Daisia Slomski unfortunately died in an automobile accident in December of 2011. The patient has 2 grandchildren. She attends to a local [employer].    ADVANCED DIRECTIVES: Not in place   HEALTH MAINTENANCE: (Updated 10/13/2013) History  Substance Use Topics  . Smoking status: Never Smoker   . Smokeless tobacco: Never Used  . Alcohol Use: No     Colonoscopy: 2005  PAP: Not on file  Bone density:  Not on file  Lipid panel:  Not on file/Dr. Polite   Allergies  Allergen Reactions  . Ondansetron Hcl Other (See Comments)    HEADACHE, patient states she still takes   . Codeine Nausea And Vomiting    "violently ill"  . Penicillins Rash    Childhood reaction -    Current Outpatient Prescriptions  Medication Sig Dispense Refill  . acetaminophen (TYLENOL) 325 MG tablet Take 650 mg by mouth every 8 (eight) hours as needed for pain.      . capecitabine (XELODA) 500 MG tablet Take 3 tablets (1,500 mg total) by mouth 2 (two) times daily after a meal.  84 tablet  4  . omeprazole (PRILOSEC) 20 MG capsule TAKE 1 CAPSULE (20 MG TOTAL) BY MOUTH 2 (TWO) TIMES DAILY.  60 capsule  1  . traZODone (DESYREL) 50 MG tablet TAKE 1 TO 2 TABLETS AT BEDTIME FOR SLEEP  60 tablet  1  . triamcinolone (NASACORT) 55 MCG/ACT  nasal inhaler Place 2 sprays into the nose daily.      Marland Kitchen zolpidem (AMBIEN) 5 MG tablet TAKE 1 TABLET BY MOUTH AT BEDTIME FOR SLEEP  20 tablet  0  . aspirin-acetaminophen-caffeine (EXCEDRIN MIGRAINE) 562-130-86 MG per tablet Take 1 tablet by mouth every 6 (six) hours as needed for headache.      . diazepam (VALIUM) 5 MG tablet Take 1 tablet 1 hour before exam and then may repeat x 1 if needed  2 tablet  0  . lidocaine-prilocaine (EMLA) cream Apply topically as needed.  30 g  3  . LORazepam (ATIVAN) 0.5 MG tablet Take 1 tablet (0.5 mg total) by mouth every 8 (eight) hours as needed for anxiety.  15 tablet  0  . ondansetron (ZOFRAN) 8 MG tablet Take 8 mg by mouth every 8 (eight) hours as needed for nausea or vomiting.      Marland Kitchen PATADAY 0.2 % SOLN Place 1 drop into both eyes daily as needed (dry eyes).       . prochlorperazine (COMPAZINE) 10 MG tablet Take 1 tablet (10 mg total) by mouth every 6 (six) hours as needed for nausea.  30 tablet  2  . promethazine (PHENERGAN) 25 MG tablet       . tobramycin-dexamethasone (TOBRADEX) ophthalmic solution Place 1 drop into both eyes daily.  5 mL  1  . traMADol (ULTRAM) 50 MG tablet Take 2 tablets (100 mg total) by mouth every 6 (six) hours as needed for moderate pain.  120 tablet  0   No current facility-administered medications for this visit.    OBJECTIVE: Middle-aged white woman who appears comfortable and is in no acute distress Filed Vitals:   10/13/13 1022  BP: 123/73  Pulse: 81  Temp: 99.6 F (37.6 C)  Resp: 18     Body mass index is 20.57 kg/(m^2).    ECOG FS: 1 Filed Weights   10/13/13 1022  Weight: 119 lb 14.4 oz (54.386 kg)   Physical Exam: HEENT:  Sclerae anicteric.  Oropharynx clear, pink, and moist. Fair condition. No ulcerations or mucositis. No evidence of candidiasis. Neck is supple, trachea midline. NODES:  No cervical or supraclavicular lymphadenopathy palpated.  BREAST EXAM:  In the lateral portion of the right breast, there is a  palpable mass measuring approximately 3 cm in diameter, firm, but softer than when I last examined the patient. There is some hyperpigmentation of the skin over the mass, but no additional skin changes noted. Left breast is unremarkable.  Axillae are benign, with no palpable lymphadenopathy. LUNGS:  Clear to auscultation bilaterally.  No wheezes or rhonchi HEART:  Regular rate and rhythm. No murmur  ABDOMEN:  Soft, thin, nontender. No hepatomegaly. Positive bowel sounds.  MSK:  No focal spinal tenderness to palpation. Good range of motion bilaterally in the upper extremities, including the right upper extremity. EXTREMITIES:  No peripheral edema.  No lymphedema in the right upper extremity. SKIN:  There is an erythematous maculopapular rash diffusely on the forearms bilaterally. Some of the lesions are beginning to dry. There are no pustules or vesicles noted. Skin is otherwise unremarkable with no rashes visible elsewhere. No excessive ecchymoses. No petechiae. No pallor. NEURO:  Nonfocal. Well oriented.  Positive affect.    LAB RESULTS:   Lab Results  Component Value Date   WBC 6.1 09/29/2013   NEUTROABS 4.2 09/29/2013   HGB 12.1 09/29/2013   HCT 35.8 09/29/2013   MCV 95.2 09/29/2013   PLT 222 09/29/2013      Chemistry      Component Value Date/Time   NA 141 10/13/2013 1015   NA 135 07/01/2013 1022   K 4.1 10/13/2013 1015   K 4.1 07/01/2013 1022   CL 98 07/01/2013 1022   CO2 23 10/13/2013 1015   CO2 27 07/01/2013 1022   BUN 16.5 10/13/2013 1015   BUN 11 07/01/2013 1022   CREATININE 0.8 10/13/2013 1015   CREATININE 1.00 2013-10-15 0747      Component Value Date/Time   CALCIUM 9.2 10/13/2013 1015   CALCIUM 8.8 07/01/2013 1022   ALKPHOS 105 10/13/2013 1015   ALKPHOS 117 06/24/2013 2245   AST 23 10/13/2013 1015   AST 22 06/24/2013 2245   ALT 31 10/13/2013 1015   ALT 28 06/24/2013 2245   BILITOT 0.48 10/13/2013 1015   BILITOT 0.3 06/24/2013 2245        STUDIES:  Mr Liver W Wo Contrast 15-Oct-2013    CLINICAL DATA:  Breast cancer with liver metastases  EXAM: MRI ABDOMEN WITHOUT AND WITH CONTRAST  TECHNIQUE: Multiplanar, multisequence MR imaging was performed both before and after administration of intravenous contrast.  CONTRAST:  71mL MULTIHANCE GADOBENATE DIMEGLUMINE 529 MG/ML IV SOLN  COMPARISON:  CT abdomen pelvis dated 07/15/2013  FINDINGS: 1.7 x 2.6 cm lateral right breast lesion (series 3/image 6), incompletely visualized, previously 3.9 x 5.7 cm.  Scattered small hypoenhancing lesions in the right hepatic lobe, suspicious for metastases, including:  --5 mm lesion in the anterior segment right hepatic dome (series 1102/image 22)  --Two 5-6 mm lesions in the posterior segment right hepatic dome (series 1102/image 28)  --5 mm subcapsular lesion in the posterior segment right hepatic lobe (series 1102/image 45)  --8 mm lesion in the posterior segment right hepatic lobe (series 1102/image 51)  --6 mm lesion inferiorly in the posterior segment right hepatic lobe (series loaded 2/image 60)  Although difficult to directly compare to prior CT, this appearance is likely improved overall.  Spleen, pancreas, and adrenal glands are within normal limits.  Gallbladder is notable for layering sludge and/or small stones (series 8/ image 35), without associated inflammatory changes. No intrahepatic or extrahepatic ductal dilatation.  Kidneys are within normal limits.  No hydronephrosis.  No abdominal ascites.  No suspicious abdominal lymphadenopathy.  No focal osseous lesions.  IMPRESSION: 2.6 cm lesion in the lateral right breast, decreased.  Scattered small probable metastases in the right hepatic lobe, measuring up to 8 mm, as above.   Electronically Signed   By:  Charline Bills M.D.   On: 09/24/2013 10:21     ASSESSMENT: 63 y.o. Jacquenette Shone, [place] woman status post right breast upper outer quadrant and right axillary lymph node biopsy 12/23/2012 for a clinical T3 N1 M1, stage IV invasive ductal carcinoma, grade 3,  triple negative, with an MIB-1 of 86%.  (1) right liver lobe biopsy 01/21/2013 confirms metastatic adenocarcinoma; scans show involvement of the liver, lungs, and likely bone.  (2) enrolled in [facility] study [id number] (docetaxel + oral gamma secretase inibitor [id number]), received one cycle starting 02/04/2013  but withdrew because of poor tolerance despite treatment interruptions and decreased dosing of the oral component  (3)  chest CT in early November 2014 was compared with studies in August 2014 and showed interval progression of metastatic breast cancer, with an enlarging primary right breast mass, enlarging right axillary lymph nodes, enlarging pulmonary nodules and new/enlarging hepatic metastases.  (4) Abraxane started 03/19/2013, given day 1 and day 8 of each 21 day cycle, stopped with the day 1 cycle 3 dose (04/29/2013) because of local progression of disease  (5) cyclophosphamide and doxorubicin started 05/20/2013, given in dose dense fashion  with Neulasta support on day 2. Completed 4 cycles 07/03/2013, with the final cycle dose reduced 15% because of an episode of febrile neutropenia after cycle 3. Restaging studies 07/15/2013 showed partial response.  (6) started capecitabine in April 2015, 1.5 g by mouth twice a day, 7 days on 7 days off  (7) epigastric pain with negative EGD 08/19/2013, improving on Prilosec   PLAN: Overall, Jae Dire appears to be doing well and is tolerating the  Capecitabine.   She certainly will let us know if the bowel movements become looser, or certainly if they are watery. I have also asked her to let us know if she has more than 4 bowel movements on a daily basis. She can take 1 dose of Imodium, but if that does not help, she should let us know.  I also suggested that she try some local hydrocortisone cream on her arms, and avoid direct sunlight as much as possible. Fortunately she has no actual evidence of PPE, but she will let us know if this rash worsens in  any way.   I am making no changes in Umi's treatment regimen at this time. Certainly if any of her side effects worsen, we could consider decreasing her dose slightly to either 3 tablets in the morning and 2 at night, or 2 tablets twice daily.  Per Dr. Darnelle Catalan plans, we will continue to check her labs every 2 weeks, and see her for physical exam approximately every 4 week. I will mention that she will be out of town on June 29, so we are shifting that lab appointment to the following Monday, July 6. We will flush her port today, and then on an every 6-8 week basis, with next/scheduled for July 13.  [name] and I reviewed her liver MRI results together today. Dr. Darnelle Catalan has suggested repeating her liver MRI in 2 1/2 to 3 months to follow her response. Accordingly, this will be scheduled for late July, and she will see Dr. Darnelle Catalan for followup soon thereafter on July 27 to review those results and discuss her treatment plan.  I will make mention that documentation on 10/12/2013, [facility] did find a BRCA1 variant that suggests she may benefit particularly from a PARPP inhibitor (like Olaparib, if we can get it for her) and/or platinum.    The above plan was reviewed in detail  with Zigmund Daniel today. She voices both her understanding and agreement with this plan, and will call with any changes or problems prior to her next visit. She does understand that the goal of treatment in her case is disease control.   Theotis Burrow, PA-C    10/13/2013 11:43 AM

## 2013-10-16 ENCOUNTER — Other Ambulatory Visit: Payer: Self-pay | Admitting: *Deleted

## 2013-10-16 ENCOUNTER — Other Ambulatory Visit: Payer: Self-pay | Admitting: Oncology

## 2013-10-16 DIAGNOSIS — C50919 Malignant neoplasm of unspecified site of unspecified female breast: Secondary | ICD-10-CM

## 2013-10-16 DIAGNOSIS — C50419 Malignant neoplasm of upper-outer quadrant of unspecified female breast: Secondary | ICD-10-CM

## 2013-10-16 DIAGNOSIS — C801 Malignant (primary) neoplasm, unspecified: Secondary | ICD-10-CM

## 2013-10-21 ENCOUNTER — Other Ambulatory Visit: Payer: Self-pay | Admitting: Oncology

## 2013-10-21 DIAGNOSIS — C50919 Malignant neoplasm of unspecified site of unspecified female breast: Secondary | ICD-10-CM

## 2013-10-22 ENCOUNTER — Telehealth: Payer: Self-pay | Admitting: *Deleted

## 2013-10-22 NOTE — Telephone Encounter (Signed)
[facility] sent facsimile confirmation of Capecitabine prescription shipment. Capecitabine was shipped on 10-21-2013 with next business day delivery.

## 2013-10-27 ENCOUNTER — Other Ambulatory Visit (HOSPITAL_BASED_OUTPATIENT_CLINIC_OR_DEPARTMENT_OTHER): Payer: BC Managed Care – PPO

## 2013-10-27 ENCOUNTER — Other Ambulatory Visit: Payer: Self-pay | Admitting: *Deleted

## 2013-10-27 DIAGNOSIS — C50411 Malignant neoplasm of upper-outer quadrant of right female breast: Secondary | ICD-10-CM

## 2013-10-27 DIAGNOSIS — C50919 Malignant neoplasm of unspecified site of unspecified female breast: Secondary | ICD-10-CM

## 2013-10-27 DIAGNOSIS — C50419 Malignant neoplasm of upper-outer quadrant of unspecified female breast: Secondary | ICD-10-CM

## 2013-10-27 LAB — CBC WITH DIFFERENTIAL/PLATELET
BASO%: 0.6 % (ref 0.0–2.0)
BASOS ABS: 0 10*3/uL (ref 0.0–0.1)
EOS%: 3.3 % (ref 0.0–7.0)
Eosinophils Absolute: 0.2 10*3/uL (ref 0.0–0.5)
HCT: 34.1 % — ABNORMAL LOW (ref 34.8–46.6)
HEMOGLOBIN: 11.6 g/dL (ref 11.6–15.9)
LYMPH%: 12 % — ABNORMAL LOW (ref 14.0–49.7)
MCH: 32.9 pg (ref 25.1–34.0)
MCHC: 34 g/dL (ref 31.5–36.0)
MCV: 96.6 fL (ref 79.5–101.0)
MONO#: 0.6 10*3/uL (ref 0.1–0.9)
MONO%: 8.4 % (ref 0.0–14.0)
NEUT%: 75.7 % (ref 38.4–76.8)
NEUTROS ABS: 5.7 10*3/uL (ref 1.5–6.5)
PLATELETS: 229 10*3/uL (ref 145–400)
RBC: 3.53 10*6/uL — AB (ref 3.70–5.45)
RDW: 17.4 % — ABNORMAL HIGH (ref 11.2–14.5)
WBC: 7.5 10*3/uL (ref 3.9–10.3)
lymph#: 0.9 10*3/uL (ref 0.9–3.3)

## 2013-10-27 LAB — COMPREHENSIVE METABOLIC PANEL (CC13)
ALBUMIN: 3.9 g/dL (ref 3.5–5.0)
ALT: 31 U/L (ref 0–55)
ANION GAP: 8 meq/L (ref 3–11)
AST: 25 U/L (ref 5–34)
Alkaline Phosphatase: 95 U/L (ref 40–150)
BILIRUBIN TOTAL: 0.62 mg/dL (ref 0.20–1.20)
BUN: 18.1 mg/dL (ref 7.0–26.0)
CO2: 28 meq/L (ref 22–29)
Calcium: 9.2 mg/dL (ref 8.4–10.4)
Chloride: 104 mEq/L (ref 98–109)
Creatinine: 0.9 mg/dL (ref 0.6–1.1)
GLUCOSE: 94 mg/dL (ref 70–140)
POTASSIUM: 3.9 meq/L (ref 3.5–5.1)
SODIUM: 140 meq/L (ref 136–145)
Total Protein: 6.9 g/dL (ref 6.4–8.3)

## 2013-10-28 ENCOUNTER — Other Ambulatory Visit: Payer: Self-pay | Admitting: Oncology

## 2013-10-28 DIAGNOSIS — C50419 Malignant neoplasm of upper-outer quadrant of unspecified female breast: Secondary | ICD-10-CM

## 2013-11-05 ENCOUNTER — Other Ambulatory Visit: Payer: Self-pay | Admitting: Physician Assistant

## 2013-11-05 DIAGNOSIS — C50911 Malignant neoplasm of unspecified site of right female breast: Secondary | ICD-10-CM

## 2013-11-10 ENCOUNTER — Other Ambulatory Visit: Payer: BC Managed Care – PPO

## 2013-11-17 ENCOUNTER — Other Ambulatory Visit: Payer: BC Managed Care – PPO

## 2013-11-17 ENCOUNTER — Other Ambulatory Visit (HOSPITAL_BASED_OUTPATIENT_CLINIC_OR_DEPARTMENT_OTHER): Payer: BC Managed Care – PPO

## 2013-11-17 ENCOUNTER — Ambulatory Visit (HOSPITAL_BASED_OUTPATIENT_CLINIC_OR_DEPARTMENT_OTHER): Payer: BC Managed Care – PPO | Admitting: Adult Health

## 2013-11-17 ENCOUNTER — Encounter: Payer: Self-pay | Admitting: Adult Health

## 2013-11-17 VITALS — BP 110/67 | HR 83 | Temp 98.4°F | Resp 18 | Ht 64.0 in | Wt 119.7 lb

## 2013-11-17 DIAGNOSIS — C50411 Malignant neoplasm of upper-outer quadrant of right female breast: Secondary | ICD-10-CM

## 2013-11-17 DIAGNOSIS — C787 Secondary malignant neoplasm of liver and intrahepatic bile duct: Secondary | ICD-10-CM

## 2013-11-17 DIAGNOSIS — C50419 Malignant neoplasm of upper-outer quadrant of unspecified female breast: Secondary | ICD-10-CM

## 2013-11-17 DIAGNOSIS — Z171 Estrogen receptor negative status [ER-]: Secondary | ICD-10-CM

## 2013-11-17 DIAGNOSIS — C773 Secondary and unspecified malignant neoplasm of axilla and upper limb lymph nodes: Secondary | ICD-10-CM

## 2013-11-17 DIAGNOSIS — C50911 Malignant neoplasm of unspecified site of right female breast: Secondary | ICD-10-CM

## 2013-11-17 DIAGNOSIS — C7800 Secondary malignant neoplasm of unspecified lung: Secondary | ICD-10-CM

## 2013-11-17 LAB — CBC WITH DIFFERENTIAL/PLATELET
BASO%: 0.6 % (ref 0.0–2.0)
Basophils Absolute: 0.1 10*3/uL (ref 0.0–0.1)
EOS%: 2.3 % (ref 0.0–7.0)
Eosinophils Absolute: 0.2 10*3/uL (ref 0.0–0.5)
HCT: 35.1 % (ref 34.8–46.6)
HGB: 11.8 g/dL (ref 11.6–15.9)
LYMPH#: 1.3 10*3/uL (ref 0.9–3.3)
LYMPH%: 14.7 % (ref 14.0–49.7)
MCH: 33.6 pg (ref 25.1–34.0)
MCHC: 33.6 g/dL (ref 31.5–36.0)
MCV: 100.1 fL (ref 79.5–101.0)
MONO#: 0.5 10*3/uL (ref 0.1–0.9)
MONO%: 6 % (ref 0.0–14.0)
NEUT#: 6.6 10*3/uL — ABNORMAL HIGH (ref 1.5–6.5)
NEUT%: 76.4 % (ref 38.4–76.8)
Platelets: 211 10*3/uL (ref 145–400)
RBC: 3.5 10*6/uL — AB (ref 3.70–5.45)
RDW: 18.3 % — ABNORMAL HIGH (ref 11.2–14.5)
WBC: 8.6 10*3/uL (ref 3.9–10.3)

## 2013-11-17 LAB — COMPREHENSIVE METABOLIC PANEL (CC13)
ALT: 26 U/L (ref 0–55)
AST: 25 U/L (ref 5–34)
Albumin: 4 g/dL (ref 3.5–5.0)
Alkaline Phosphatase: 94 U/L (ref 40–150)
Anion Gap: 7 mEq/L (ref 3–11)
BILIRUBIN TOTAL: 0.7 mg/dL (ref 0.20–1.20)
BUN: 18 mg/dL (ref 7.0–26.0)
CALCIUM: 9.4 mg/dL (ref 8.4–10.4)
CHLORIDE: 105 meq/L (ref 98–109)
CO2: 28 meq/L (ref 22–29)
Creatinine: 0.8 mg/dL (ref 0.6–1.1)
Glucose: 90 mg/dl (ref 70–140)
POTASSIUM: 3.9 meq/L (ref 3.5–5.1)
SODIUM: 140 meq/L (ref 136–145)
TOTAL PROTEIN: 7 g/dL (ref 6.4–8.3)

## 2013-11-17 NOTE — Progress Notes (Signed)
. Patient ID: Carrie Berry, female   DOB: 1951-01-30, 63 y.o.   MRN: [id number] ID: Carrie Berry OB: 12/12/1950  MR#: [id number]  CSN#:[id number]  PCP: [name], MD GYN:   SU: [name] OTHER MD: Thea Silversmith, Earle Gell, Christene Slates, Erline Levine  CHIEF COMPLAINT:  Metastatic Breast Cancer   BREAST CANCER HISTORY:  Beverlee noted a mass in her right breast December of 2013, but did not think much of it. It did grow some lower the summer, but she was keeping her grandchild at that time and was too busy so she did not bring it to Dr. Bernell List attention until August. He set her up for bilateral mammography and ultrasonography at [facility] 12/18/2012 and this measured, on the right, a large irregular lobulated mass in the upper outer quadrant which by ultrasound measured 5.7 cm. The right axilla showed some lymph nodes with thickened cortices. In the left breast there was a focal asymmetry at the depth, lateral to the nipple, and by ultrasound there was a 9 mm ill-defined hypoechoic lesion in this location. Biopsy of the left breast lesion showed only fibrocystic changes.  Biopsy of the right breast mass and right axillary adenopathy ([id number]) on 12/23/2012 showed both to be involved by invasive ductal carcinoma, grade 2, triple negative, with an MIB-1 of 86%.  MRI of the breast 12/27/2012 at [facility] imaging showed in the right breast a mass abutting the pectoralis muscle without enhancement of the muscle measuring 4.8 cm. The satellite nodule measuring approximately 3 mm was also noted superior to the mass and several abnormal and enlarged right axillary lymph nodes were noted, one of which appeared to be necrotic. The largest node measured 2.0 cm. Unfortunately, numerous bilateral pulmonary nodules were also noted.  The patient's subsequent history is as detailed below   INTERVAL HISTORY: Carrie Berry returns alone today for followup of her metastatic breast cancer. She is currently on  capecitabine, 3 tablets twice daily, one week on and one-week off.  She is currently on her off week.    Carrie Berry tells me that she is tolerating the Capecitabine well.  She previously had a rash on her forearms that has since resolved.  She otherwise denies fevers, chills, nausea, vomiting, constipation, diarrhea, numbness/tingling, skin changes, mouth pain/ulcerations, or any further concerns.    REVIEW OF SYSTEMS A 10 point review of systems was conducted and is otherwise negative except for what is noted above.      PAST MEDICAL HISTORY: Past Medical History  Diagnosis Date  . Recurrent sinus infections   . Arthritis   . PONV (postoperative nausea and vomiting)   . Anxiety   . GERD (gastroesophageal reflux disease)   . 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 rt breast ca dx'd 12/2011    breast  . Breast cancer   . 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 Tubal ligation    . Tubal ligation  1985  . Portacath placement N/A 01/10/2013    Procedure: ULTRASOUND GUIDED PORT-A-CATH INSERTION WITH FLUOROSCOPY;  Surgeon: [name], MD;  Location: [place];  Service: General;  Laterality: N/A;  . Esophagogastroduodenoscopy N/A 08/19/2013    Procedure: ESOPHAGOGASTRODUODENOSCOPY (EGD);  Surgeon: Garlan Fair, MD;  Location: [facility];  Service: Endoscopy;  Laterality: N/A;    FAMILY HISTORY Family History  Problem Relation Age of Onset  . Bladder  Cancer Father   . Colon cancer Maternal Grandmother    the patient's parents are living, both in their 14s. The patient's father was diagnosed with bladder cancer the age of 89. The patient's mother's mother had some type of gastrointestinal cancer. The patient had one brother, no sisters. There is no history of breast or ovarian cancer in the family other than a cousin on the father's side who was diagnosed with  breast cancer apparently before the age of 23.  GYNECOLOGIC HISTORY:   (Reviewed 10/13/2013) Menarche age 34, first live birth age 20, the patient is [place] P2. She went through menopause in 2008. She did not take hormone replacement. She took birth control for approximately 22 years remotely.  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 She's currently on disability. Her husband Madell Heino [name]"), is vice Development worker, international aid for [employer]. The patient's daughter Carrie Berry is a stay-at-home mom in [place], the patient's son Carrie Berry unfortunately died in an automobile accident in December of 2011. The patient has 2 grandchildren. She attends to a local [employer].    ADVANCED DIRECTIVES: Not in place   HEALTH MAINTENANCE: (Updated 10/13/2013) History  Substance Use Topics  . Smoking status: Never Smoker   . Smokeless tobacco: Never Used  . Alcohol Use: No     Colonoscopy: 2005  PAP: Not on file  Bone density: Not on file  Lipid panel:  Not on file/Dr. Polite   Allergies  Allergen Reactions  . Ondansetron Hcl Other (See Comments)    HEADACHE, patient states she still takes   . Codeine Nausea And Vomiting    "violently ill"  . Penicillins Rash    Childhood reaction -    Current Outpatient Prescriptions  Medication Sig Dispense Refill  . acetaminophen (TYLENOL) 325 MG tablet Take 650 mg by mouth every 8 (eight) hours as needed for pain.      Marland Kitchen aspirin-acetaminophen-caffeine (EXCEDRIN MIGRAINE) 250-250-65 MG per tablet Take 1 tablet by mouth every 6 (six) hours as needed for headache.      . capecitabine (XELODA) 500 MG tablet Take 3 tablets (1,500 mg total) by mouth 2 (two) times daily after a meal.  84 tablet  4  . diazepam (VALIUM) 5 MG tablet Take 1 tablet 1 hour before exam and then may repeat x 1 if needed  2 tablet  0  . lidocaine-prilocaine (EMLA)  cream Apply topically as needed.  30 g  3  . LORazepam (ATIVAN) 0.5 MG tablet Take 1 tablet (0.5 mg total) by mouth every 8 (eight) hours as needed for anxiety.  15 tablet  0  . omeprazole (PRILOSEC) 20 MG capsule TAKE 1 CAPSULE (20 MG TOTAL) BY MOUTH 2 (TWO) TIMES DAILY.  60 capsule  1  . ondansetron (ZOFRAN) 8 MG tablet Take 8 mg by mouth every 8 (eight) hours as needed for nausea or vomiting.      Marland Kitchen PATADAY 0.2 % SOLN Place 1 drop into both eyes daily as needed (dry eyes).       . prochlorperazine (COMPAZINE) 10 MG tablet Take 1 tablet (10 mg total) by mouth every 6 (six) hours as needed for nausea.  30 tablet  2  . promethazine (PHENERGAN) 25 MG tablet       . tobramycin-dexamethasone (TOBRADEX) ophthalmic solution Place 1 drop into both eyes daily.  5 mL  1  . traMADol (ULTRAM) 50 MG  tablet Take 2 tablets (100 mg total) by mouth every 6 (six) hours as needed for moderate pain.  120 tablet  0  . traZODone (DESYREL) 50 MG tablet TAKE 1 TO 2 TABLETS AT BEDTIME FOR SLEEP  60 tablet  0  . triamcinolone (NASACORT) 55 MCG/ACT nasal inhaler Place 2 sprays into the nose daily.      Marland Kitchen zolpidem (AMBIEN) 5 MG tablet TAKE 1 TABLET BY MOUTH AT BEDTIME FOR SLEEP  20 tablet  1   No current facility-administered medications for this visit.    OBJECTIVE: Middle-aged white woman who appears comfortable and is in no acute distress Filed Vitals:   11/17/13 1311  BP: 110/67  Pulse: 83  Temp: 98.4 F (36.9 C)  Resp: 18     Body mass index is 20.54 kg/(m^2).    ECOG FS: 1 Filed Weights   11/17/13 1311  Weight: 119 lb 11.2 oz (54.296 kg)   Physical Exam: HEENT:  Sclerae anicteric.  Oropharynx clear, pink, and moist. Fair condition. No ulcerations or mucositis. No evidence of candidiasis. Neck is supple, trachea midline. NODES:  No cervical or supraclavicular lymphadenopathy palpated.  BREAST EXAM:  Defferred. Axillae are benign, with no palpable lymphadenopathy. LUNGS:  Clear to auscultation  bilaterally.  No wheezes or rhonchi HEART:  Regular rate and rhythm. No murmur  ABDOMEN:  Soft, thin, nontender. No hepatomegaly. Positive bowel sounds.  MSK:  No focal spinal tenderness to palpation. Good range of motion bilaterally in the upper extremities, including the right upper extremity. EXTREMITIES:  No peripheral edema.  No lymphedema in the right upper extremity. SKIN:  There is an erythematous maculopapular rash diffusely on the forearms bilaterally. Some of the lesions are beginning to dry. There are no pustules or vesicles noted. Skin is otherwise unremarkable with no rashes visible elsewhere. No excessive ecchymoses. No petechiae. No pallor. NEURO:  Nonfocal. Well oriented.  Positive affect.    LAB RESULTS:   Lab Results  Component Value Date   WBC 8.6 11/17/2013   NEUTROABS 6.6* 11/17/2013   HGB 11.8 11/17/2013   HCT 35.1 11/17/2013   MCV 100.1 11/17/2013   PLT 211 11/17/2013      Chemistry      Component Value Date/Time   NA 140 11/17/2013 1304   NA 135 07/01/2013 1022   K 3.9 11/17/2013 1304   K 4.1 07/01/2013 1022   CL 98 07/01/2013 1022   CO2 28 11/17/2013 1304   CO2 27 07/01/2013 1022   BUN 18.0 11/17/2013 1304   BUN 11 07/01/2013 1022   CREATININE 0.8 11/17/2013 1304   CREATININE 1.00 2013-10-04 0747      Component Value Date/Time   CALCIUM 9.4 11/17/2013 1304   CALCIUM 8.8 07/01/2013 1022   ALKPHOS 94 11/17/2013 1304   ALKPHOS 117 06/24/2013 2245   AST 25 11/17/2013 1304   AST 22 06/24/2013 2245   ALT 26 11/17/2013 1304   ALT 28 06/24/2013 2245   BILITOT 0.70 11/17/2013 1304   BILITOT 0.3 06/24/2013 2245        STUDIES:  Mr Liver W Wo Contrast 10/04/13   CLINICAL DATA:  Breast cancer with liver metastases  EXAM: MRI ABDOMEN WITHOUT AND WITH CONTRAST  TECHNIQUE: Multiplanar, multisequence MR imaging was performed both before and after administration of intravenous contrast.  CONTRAST:  74m MULTIHANCE GADOBENATE DIMEGLUMINE 529 MG/ML IV SOLN  COMPARISON:  CT abdomen pelvis dated  07/15/2013  FINDINGS: 1.7 x 2.6 cm lateral right breast lesion (series 3/image  6), incompletely visualized, previously 3.9 x 5.7 cm.  Scattered small hypoenhancing lesions in the right hepatic lobe, suspicious for metastases, including:  --5 mm lesion in the anterior segment right hepatic dome (series 1102/image 22)  --Two 5-6 mm lesions in the posterior segment right hepatic dome (series 1102/image 28)  --5 mm subcapsular lesion in the posterior segment right hepatic lobe (series 1102/image 45)  --8 mm lesion in the posterior segment right hepatic lobe (series 1102/image 51)  --6 mm lesion inferiorly in the posterior segment right hepatic lobe (series loaded 2/image 60)  Although difficult to directly compare to prior CT, this appearance is likely improved overall.  Spleen, pancreas, and adrenal glands are within normal limits.  Gallbladder is notable for layering sludge and/or small stones (series 8/ image 35), without associated inflammatory changes. No intrahepatic or extrahepatic ductal dilatation.  Kidneys are within normal limits.  No hydronephrosis.  No abdominal ascites.  No suspicious abdominal lymphadenopathy.  No focal osseous lesions.  IMPRESSION: 2.6 cm lesion in the lateral right breast, decreased.  Scattered small probable metastases in the right hepatic lobe, measuring up to 8 mm, as above.   Electronically Signed   By: Julian Hy M.D.   On: 09/24/2013 10:21     ASSESSMENT: 63 y.o. [name], [place] woman status post right breast upper outer quadrant and right axillary lymph node biopsy 12/23/2012 for a clinical T3 N1 M1, stage IV invasive ductal carcinoma, grade 3, triple negative, with an MIB-1 of 86%.  (1) right liver lobe biopsy 01/21/2013 confirms metastatic adenocarcinoma; scans show involvement of the liver, lungs, and likely bone.  (2) enrolled in [facility] study [id number] (docetaxel + oral gamma secretase inibitor [id number]), received one cycle starting 02/04/2013  but withdrew because  of poor tolerance despite treatment interruptions and decreased dosing of the oral component  (3)  chest CT in early November 2014 was compared with studies in August 2014 and showed interval progression of metastatic breast cancer, with an enlarging primary right breast mass, enlarging right axillary lymph nodes, enlarging pulmonary nodules and new/enlarging hepatic metastases.  (4) Abraxane started 03/19/2013, given day 1 and day 8 of each 21 day cycle, stopped with the day 1 cycle 3 dose (04/29/2013) because of local progression of disease  (5) cyclophosphamide and doxorubicin started 05/20/2013, given in dose dense fashion  with Neulasta support on day 2. Completed 4 cycles 07/03/2013, with the final cycle dose reduced 15% because of an episode of febrile neutropenia after cycle 3. Restaging studies 07/15/2013 showed partial response.  (6) started capecitabine in April 2015, 1.5 g by mouth twice a day, 7 days on 7 days off  (7) epigastric pain with negative EGD 08/19/2013, improving on Prilosec   PLAN: Anda Kraft continues to do well today.  She is tolerating the Capecitabine well.  Her lab work remains stable.  I reviewed her lab work with her today in detail.    Per Dr. [name] plans, we will continue to check her labs every 2 weeks, and see her for physical exam approximately every 4 week.  She does have a port and will get that flushed every 6-8 weeks as well.     Dr. [name] has suggested repeating her liver MRI in 2 1/2 to 3 months to follow her response. This has been scheduled for 12/04/13 and she will see Dr. [name] for followup soon thereafter on July 27 to review those results and discuss her treatment plan.    I will make mention that documentation on  10/12/2013, [facility] did find a BRCA1 variant that suggests she may benefit particularly from a PARPP inhibitor (like Olaparib, if we can get it for her) and/or platinum.    The above plan was reviewed in detail with Zigmund Daniel today. She voices  both her understanding and agreement with this plan, and will call with any changes or problems prior to her next visit. She does understand that the goal of treatment in her case is disease control.  I spent 15 minutes counseling the patient face to face.  The total time spent in the appointment was 30 minutes.  Minette Headland, [place] [phone]  11/17/2013 1:47 PM

## 2013-11-17 NOTE — Patient Instructions (Signed)
You are doing well.  Your lab work is stable.  Continue taking the Capecitabine as you have been.  Please call us if you have any questions or concerns.

## 2013-11-24 ENCOUNTER — Other Ambulatory Visit (HOSPITAL_BASED_OUTPATIENT_CLINIC_OR_DEPARTMENT_OTHER): Payer: BC Managed Care – PPO

## 2013-11-24 ENCOUNTER — Ambulatory Visit (HOSPITAL_BASED_OUTPATIENT_CLINIC_OR_DEPARTMENT_OTHER): Payer: BC Managed Care – PPO

## 2013-11-24 VITALS — BP 121/62 | HR 79 | Temp 98.2°F | Resp 13

## 2013-11-24 DIAGNOSIS — Z95828 Presence of other vascular implants and grafts: Secondary | ICD-10-CM

## 2013-11-24 DIAGNOSIS — C50419 Malignant neoplasm of upper-outer quadrant of unspecified female breast: Secondary | ICD-10-CM

## 2013-11-24 DIAGNOSIS — Z452 Encounter for adjustment and management of vascular access device: Secondary | ICD-10-CM

## 2013-11-24 DIAGNOSIS — C50911 Malignant neoplasm of unspecified site of right female breast: Secondary | ICD-10-CM

## 2013-11-24 LAB — COMPREHENSIVE METABOLIC PANEL (CC13)
ALT: 33 U/L (ref 0–55)
AST: 25 U/L (ref 5–34)
Albumin: 3.8 g/dL (ref 3.5–5.0)
Alkaline Phosphatase: 101 U/L (ref 40–150)
Anion Gap: 8 mEq/L (ref 3–11)
BUN: 19 mg/dL (ref 7.0–26.0)
CALCIUM: 9.1 mg/dL (ref 8.4–10.4)
CHLORIDE: 106 meq/L (ref 98–109)
CO2: 26 mEq/L (ref 22–29)
CREATININE: 0.8 mg/dL (ref 0.6–1.1)
Glucose: 121 mg/dl (ref 70–140)
Potassium: 4.2 mEq/L (ref 3.5–5.1)
Sodium: 140 mEq/L (ref 136–145)
Total Bilirubin: 0.39 mg/dL (ref 0.20–1.20)
Total Protein: 6.8 g/dL (ref 6.4–8.3)

## 2013-11-24 LAB — CBC WITH DIFFERENTIAL/PLATELET
BASO%: 0.6 % (ref 0.0–2.0)
BASOS ABS: 0 10*3/uL (ref 0.0–0.1)
EOS%: 6.3 % (ref 0.0–7.0)
Eosinophils Absolute: 0.4 10*3/uL (ref 0.0–0.5)
HEMATOCRIT: 34 % — AB (ref 34.8–46.6)
HEMOGLOBIN: 11.5 g/dL — AB (ref 11.6–15.9)
LYMPH#: 1 10*3/uL (ref 0.9–3.3)
LYMPH%: 13.8 % — ABNORMAL LOW (ref 14.0–49.7)
MCH: 34.2 pg — AB (ref 25.1–34.0)
MCHC: 34 g/dL (ref 31.5–36.0)
MCV: 100.6 fL (ref 79.5–101.0)
MONO#: 0.6 10*3/uL (ref 0.1–0.9)
MONO%: 8.2 % (ref 0.0–14.0)
NEUT#: 5 10*3/uL (ref 1.5–6.5)
NEUT%: 71.1 % (ref 38.4–76.8)
Platelets: 218 10*3/uL (ref 145–400)
RBC: 3.38 10*6/uL — ABNORMAL LOW (ref 3.70–5.45)
RDW: 18.8 % — ABNORMAL HIGH (ref 11.2–14.5)
WBC: 7.1 10*3/uL (ref 3.9–10.3)

## 2013-11-24 MED ORDER — SODIUM CHLORIDE 0.9 % IJ SOLN
10.0000 mL | INTRAMUSCULAR | Status: DC | PRN
  Administered 2013-11-24: 10 mL via INTRAVENOUS
  Filled 2013-11-24: qty 10

## 2013-11-24 MED ORDER — HEPARIN SOD (PORK) LOCK FLUSH 100 UNIT/ML IV SOLN
500.0000 [IU] | Freq: Once | INTRAVENOUS | Status: AC
  Administered 2013-11-24: 500 [IU] via INTRAVENOUS
  Filled 2013-11-24: qty 5

## 2013-11-24 NOTE — Patient Instructions (Signed)

## 2013-12-01 ENCOUNTER — Other Ambulatory Visit: Payer: Self-pay | Admitting: Oncology

## 2013-12-01 ENCOUNTER — Other Ambulatory Visit: Payer: BC Managed Care – PPO

## 2013-12-04 ENCOUNTER — Ambulatory Visit (HOSPITAL_COMMUNITY)
Admission: RE | Admit: 2013-12-04 | Discharge: 2013-12-04 | Disposition: A | Discharge status: Home / Self Care | Payer: BC Managed Care – PPO | Source: Ambulatory Visit | Attending: Physician Assistant | Admitting: Physician Assistant

## 2013-12-04 DIAGNOSIS — K7689 Other specified diseases of liver: Secondary | ICD-10-CM | POA: Insufficient documentation

## 2013-12-04 DIAGNOSIS — C50919 Malignant neoplasm of unspecified site of unspecified female breast: Secondary | ICD-10-CM

## 2013-12-04 DIAGNOSIS — C50411 Malignant neoplasm of upper-outer quadrant of right female breast: Secondary | ICD-10-CM

## 2013-12-04 DIAGNOSIS — Z9221 Personal history of antineoplastic chemotherapy: Secondary | ICD-10-CM | POA: Insufficient documentation

## 2013-12-04 MED ORDER — GADOBENATE DIMEGLUMINE 529 MG/ML IV SOLN
15.0000 mL | Freq: Once | INTRAVENOUS | Status: AC | PRN
  Administered 2013-12-04: 11 mL via INTRAVENOUS

## 2013-12-08 ENCOUNTER — Other Ambulatory Visit: Payer: BC Managed Care – PPO

## 2013-12-08 ENCOUNTER — Telehealth: Payer: Self-pay | Admitting: Oncology

## 2013-12-08 ENCOUNTER — Other Ambulatory Visit (HOSPITAL_BASED_OUTPATIENT_CLINIC_OR_DEPARTMENT_OTHER): Payer: BC Managed Care – PPO

## 2013-12-08 ENCOUNTER — Ambulatory Visit (HOSPITAL_BASED_OUTPATIENT_CLINIC_OR_DEPARTMENT_OTHER): Payer: BC Managed Care – PPO | Admitting: Oncology

## 2013-12-08 VITALS — BP 117/55 | HR 78 | Temp 98.6°F | Resp 18 | Ht 64.0 in | Wt 121.1 lb

## 2013-12-08 DIAGNOSIS — C50911 Malignant neoplasm of unspecified site of right female breast: Secondary | ICD-10-CM

## 2013-12-08 DIAGNOSIS — C787 Secondary malignant neoplasm of liver and intrahepatic bile duct: Secondary | ICD-10-CM

## 2013-12-08 DIAGNOSIS — C50419 Malignant neoplasm of upper-outer quadrant of unspecified female breast: Secondary | ICD-10-CM

## 2013-12-08 DIAGNOSIS — C50411 Malignant neoplasm of upper-outer quadrant of right female breast: Secondary | ICD-10-CM

## 2013-12-08 DIAGNOSIS — C773 Secondary and unspecified malignant neoplasm of axilla and upper limb lymph nodes: Secondary | ICD-10-CM

## 2013-12-08 DIAGNOSIS — C7800 Secondary malignant neoplasm of unspecified lung: Secondary | ICD-10-CM

## 2013-12-08 DIAGNOSIS — C50919 Malignant neoplasm of unspecified site of unspecified female breast: Secondary | ICD-10-CM

## 2013-12-08 DIAGNOSIS — Z171 Estrogen receptor negative status [ER-]: Secondary | ICD-10-CM

## 2013-12-08 LAB — COMPREHENSIVE METABOLIC PANEL (CC13)
ALBUMIN: 3.9 g/dL (ref 3.5–5.0)
ALT: 28 U/L (ref 0–55)
AST: 24 U/L (ref 5–34)
Alkaline Phosphatase: 102 U/L (ref 40–150)
Anion Gap: 7 mEq/L (ref 3–11)
BUN: 15.8 mg/dL (ref 7.0–26.0)
CALCIUM: 9.6 mg/dL (ref 8.4–10.4)
CHLORIDE: 105 meq/L (ref 98–109)
CO2: 29 mEq/L (ref 22–29)
CREATININE: 0.8 mg/dL (ref 0.6–1.1)
Glucose: 96 mg/dl (ref 70–140)
POTASSIUM: 4.8 meq/L (ref 3.5–5.1)
Sodium: 142 mEq/L (ref 136–145)
Total Bilirubin: 0.59 mg/dL (ref 0.20–1.20)
Total Protein: 7 g/dL (ref 6.4–8.3)

## 2013-12-08 LAB — CBC WITH DIFFERENTIAL/PLATELET
BASO%: 0.6 % (ref 0.0–2.0)
BASOS ABS: 0 10*3/uL (ref 0.0–0.1)
EOS ABS: 0.4 10*3/uL (ref 0.0–0.5)
EOS%: 5.3 % (ref 0.0–7.0)
HCT: 35.1 % (ref 34.8–46.6)
HEMOGLOBIN: 11.8 g/dL (ref 11.6–15.9)
LYMPH#: 0.8 10*3/uL — AB (ref 0.9–3.3)
LYMPH%: 10.9 % — ABNORMAL LOW (ref 14.0–49.7)
MCH: 34.4 pg — ABNORMAL HIGH (ref 25.1–34.0)
MCHC: 33.6 g/dL (ref 31.5–36.0)
MCV: 102.4 fL — AB (ref 79.5–101.0)
MONO#: 0.6 10*3/uL (ref 0.1–0.9)
MONO%: 8 % (ref 0.0–14.0)
NEUT%: 75.2 % (ref 38.4–76.8)
NEUTROS ABS: 5.2 10*3/uL (ref 1.5–6.5)
Platelets: 231 10*3/uL (ref 145–400)
RBC: 3.43 10*6/uL — ABNORMAL LOW (ref 3.70–5.45)
RDW: 18.9 % — AB (ref 11.2–14.5)
WBC: 7 10*3/uL (ref 3.9–10.3)

## 2013-12-08 NOTE — Telephone Encounter (Signed)
pt req a call to sch appts-per pof to sch appt-pt wanted evening appts-pof to sch 8/24 appt w/Heather-adv Nira Conn had no openings-pt req to come one day for labs & flush next day appt-school starts back-per GM ok to double book 9/21

## 2013-12-08 NOTE — Progress Notes (Signed)
. Patient ID: Carrie Berry, female   DOB: [birth year], 63 y.o.   MRN: [id number] ID: Carrie Berry OB: 28-Jan-1951  MR#: [id number]  CSN#:[id number]  PCP: [name], MD GYN:   SU: [name] OTHER MD: Thea Silversmith, Earle Gell, Rick Cornella, Erline Levine  CHIEF COMPLAINT:  Metastatic Breast Cancer CURRENT TREATMENT: capecitabine   BREAST CANCER HISTORY: From the originally intake note:  Carrie Berry noted a mass in her right breast December of 2013, but did not think much of it. It did grow some lower the summer, but she was keeping her grandchild at that time and was too busy so she did not bring it to Dr. Bernell List attention until August. He set her up for bilateral mammography and ultrasonography at [facility] 12/18/2012 and this measured, on the right, a large irregular lobulated mass in the upper outer quadrant which by ultrasound measured 5.7 cm. The right axilla showed some lymph nodes with thickened cortices. In the left breast there was a focal asymmetry at the depth, lateral to the nipple, and by ultrasound there was a 9 mm ill-defined hypoechoic lesion in this location. Biopsy of the left breast lesion showed only fibrocystic changes.  Biopsy of the right breast mass and right axillary adenopathy ([id number]) on 12/23/2012 showed both to be involved by invasive ductal carcinoma, grade 2, triple negative, with an MIB-1 of 86%.  MRI of the breast 12/27/2012 at [facility] imaging showed in the right breast a mass abutting the pectoralis muscle without enhancement of the muscle measuring 4.8 cm. The satellite nodule measuring approximately 3 mm was also noted superior to the mass and several abnormal and enlarged right axillary lymph nodes were noted, one of which appeared to be necrotic. The largest node measured 2.0 cm. Unfortunately, numerous bilateral pulmonary nodules were also noted.  The patient's subsequent history is as detailed below   INTERVAL HISTORY: Carrie Berry returns today for  followup of her metastatic breast cancer accompanied by her husband Carrie Berry. She continues on capecitabine, 3 tablets twice daily, one week on and one-week off, today being day 1 cycle 9 of her 2 week cycles. Since her last visit here she had a restaging study which is very favorable, showing near complete resolution of her liver lesions.  REVIEW OF SYSTEMS Denys is tolerating the capecitabine moderately well. She does have some dryness in her hands, but so far no peeling or cracking. She is doing "normal stuff", mowing the lawn, taking walks, although she has to use open shoes because a 10 issues are uncomfortable on her feet. She goes to [employer] regularly. Her hair has grown back. She is keeping a bland diet. She still occasionally has stomach pain that we have evaluated with not being able to document a clear cause. We are treating it as reflux. She's had no nausea or vomiting. Her appetite is pretty good although those days to continues parotid. She's had no mouth sores. She has an occasional loose stool but no frank diarrhea. There have been no mouth sores. Her eyes have been dry and she is using some drops to help with that. She has shooting pains in her breast, which aren't too brief to treat. A detailed review of systems was otherwise stable  PAST MEDICAL HISTORY: Past Medical History  Diagnosis Date  . Recurrent sinus infections   . Arthritis   . PONV (postoperative nausea and vomiting)   . Anxiety   . GERD (gastroesophageal reflux disease)   . Antineoplastic chemotherapy induced pancytopenia  06/25/2013  . rt breast ca dx'd 12/2011    breast  . Breast cancer   . 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 Tubal ligation    . Tubal ligation  1985  . Portacath placement N/A 01/10/2013    Procedure: ULTRASOUND GUIDED PORT-A-CATH INSERTION WITH FLUOROSCOPY;  Surgeon: [name], MD;   Location: [place];  Service: General;  Laterality: N/A;  . Esophagogastroduodenoscopy N/A 08/19/2013    Procedure: ESOPHAGOGASTRODUODENOSCOPY (EGD);  Surgeon: Garlan Fair, MD;  Location: [facility];  Service: Endoscopy;  Laterality: N/A;    FAMILY HISTORY Family History  Problem Relation Age of Onset  . Bladder Cancer Father   . Colon cancer Maternal Grandmother    the patient's parents are living, both in their 75s. The patient's father was diagnosed with bladder cancer the age of 81. The patient's mother's mother had some type of gastrointestinal cancer. The patient had one brother, no sisters. There is no history of breast or ovarian cancer in the family other than a cousin on the father's side who was diagnosed with breast cancer apparently before the age of 67.  GYNECOLOGIC HISTORY:   (Reviewed 10/13/2013) Menarche age 68, first live birth age 26, the patient is [place] P2. She went through menopause in 2008. She did not take hormone replacement. She took birth control for approximately 22 years remotely.  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 She's currently on disability. Her husband Elga Santy Carrie Berry"), is vice Development worker, international aid for [employer]. The patient's daughter Carrie Berry is a stay-at-home mom in [place], the patient's son Carrie Berry unfortunately died in an automobile accident in December of 2011. The patient has 2 grandchildren. She attends to a local [employer].    ADVANCED DIRECTIVES: Not in place   HEALTH MAINTENANCE: (Updated 10/13/2013) History  Substance Use Topics  . Smoking status: Never Smoker   . Smokeless tobacco: Never Used  . Alcohol Use: No     Colonoscopy: 2005  PAP: Not on file  Bone density: Not on file  Lipid panel:  Not on file/Dr. Polite   Allergies  Allergen Reactions  . Ondansetron Hcl Other (See Comments)     HEADACHE, patient states she still takes   . Codeine Nausea And Vomiting    "violently ill"  . Penicillins Rash    Childhood reaction -    Current Outpatient Prescriptions  Medication Sig Dispense Refill  . acetaminophen (TYLENOL) 325 MG tablet Take 650 mg by mouth every 8 (eight) hours as needed for pain.      Marland Kitchen aspirin-acetaminophen-caffeine (EXCEDRIN MIGRAINE) 250-250-65 MG per tablet Take 1 tablet by mouth every 6 (six) hours as needed for headache.      . capecitabine (XELODA) 500 MG tablet Take 3 tablets (1,500 mg total) by mouth 2 (two) times daily after a meal.  84 tablet  4  . diazepam (VALIUM) 5 MG tablet Take 1 tablet 1 hour before exam and then may repeat x 1 if needed  2 tablet  0  . lidocaine-prilocaine (EMLA) cream Apply topically as needed.  30 g  3  . LORazepam (ATIVAN) 0.5 MG tablet Take 1 tablet (0.5 mg total) by mouth every 8 (eight) hours as needed for anxiety.  15 tablet  0  . omeprazole (PRILOSEC) 20  MG capsule TAKE 1 CAPSULE (20 MG TOTAL) BY MOUTH 2 (TWO) TIMES DAILY.  60 capsule  1  . ondansetron (ZOFRAN) 8 MG tablet Take 8 mg by mouth every 8 (eight) hours as needed for nausea or vomiting.      Marland Kitchen PATADAY 0.2 % SOLN Place 1 drop into both eyes daily as needed (dry eyes).       . prochlorperazine (COMPAZINE) 10 MG tablet Take 1 tablet (10 mg total) by mouth every 6 (six) hours as needed for nausea.  30 tablet  2  . promethazine (PHENERGAN) 25 MG tablet       . tobramycin-dexamethasone (TOBRADEX) ophthalmic solution Place 1 drop into both eyes daily.  5 mL  1  . traMADol (ULTRAM) 50 MG tablet Take 2 tablets (100 mg total) by mouth every 6 (six) hours as needed for moderate pain.  120 tablet  0  . traZODone (DESYREL) 50 MG tablet TAKE 1 TO 2 TABLETS BY MOUTH AT BEDTIME FOR SLEEP  60 tablet  0  . triamcinolone (NASACORT) 55 MCG/ACT nasal inhaler Place 2 sprays into the nose daily.      Marland Kitchen zolpidem (AMBIEN) 5 MG tablet TAKE 1 TABLET BY MOUTH AT BEDTIME FOR SLEEP  20 tablet   1   No current facility-administered medications for this visit.    OBJECTIVE: Middle-aged white woman in no acute distress Filed Vitals:   12/08/13 1140  BP: 117/55  Pulse: 78  Temp: 98.6 F (37 C)  Resp: 18     Body mass index is 20.78 kg/(m^2).    ECOG FS: 1 Filed Weights   12/08/13 1140  Weight: 121 lb 1.6 oz (54.931 kg)   Sclerae unicteric, pupils equal and reactive Oropharynx clear and moist-- no lesions noted No cervical or supraclavicular adenopathy Lungs no rales or rhonchi Heart regular rate and rhythm Abd soft, nontender, positive bowel sounds, no masses palpated MSK no focal spinal tenderness, no upper extremity lymphedema Neuro: nonfocal, well oriented, appropriate affect Breasts: Deferred  LAB RESULTS:   Lab Results  Component Value Date   WBC 7.0 12/08/2013   NEUTROABS 5.2 12/08/2013   HGB 11.8 12/08/2013   HCT 35.1 12/08/2013   MCV 102.4* 12/08/2013   PLT 231 12/08/2013      Chemistry      Component Value Date/Time   NA 142 12/08/2013 1105   NA 135 07/01/2013 1022   K 4.8 12/08/2013 1105   K 4.1 07/01/2013 1022   CL 98 07/01/2013 1022   CO2 29 12/08/2013 1105   CO2 27 07/01/2013 1022   BUN 15.8 12/08/2013 1105   BUN 11 07/01/2013 1022   CREATININE 0.8 12/08/2013 1105   CREATININE 1.00 09/24/2013 0747      Component Value Date/Time   CALCIUM 9.6 12/08/2013 1105   CALCIUM 8.8 07/01/2013 1022   ALKPHOS 102 12/08/2013 1105   ALKPHOS 117 06/24/2013 2245   AST 24 12/08/2013 1105   AST 22 06/24/2013 2245   ALT 28 12/08/2013 1105   ALT 28 06/24/2013 2245   BILITOT 0.59 12/08/2013 1105   BILITOT 0.3 06/24/2013 2245        STUDIES: Mr Liver W Wo Contrast  12-27-13   CLINICAL DATA:  History breast cancer. Evaluate for metastatic disease. Evaluate for response to chemotherapy or disease progression.  EXAM: MRI ABDOMEN WITHOUT AND WITH CONTRAST  TECHNIQUE: Multiplanar multisequence MR imaging of the abdomen was performed both before and after the administration of  intravenous contrast.  CONTRAST:  30mL MULTIHANCE GADOBENATE DIMEGLUMINE 529 MG/ML IV SOLN  COMPARISON:  MRI of the abdomen 09/24/2013.  FINDINGS: As with the prior examination there are several subtle hypovascular lesions scattered throughout the liver, however, these are generally smaller and less apparent than the prior study. Specifically, the cluster of small lesions in segment 7 of the liver demonstrated on prior study are essentially completely resolved (image 28 of series 1102). Previously noted 8 mm lesion in segment 6 of the liver currently measures only 5 mm (image 51 of series 1102). Likewise, the previously noted 5 mm lesion in the subcapsular aspect of segment 6 of the liver is currently only 3 mm (image 46 of series 110 to the). No other new or enlarging hepatic lesion is noted.  The appearance of the gallbladder, pancreas, spleen, bilateral adrenal glands and bilateral kidneys is unremarkable.  Chronic postoperative fluid collection in the lateral aspect of the right breast is similar to the prior examination appearing heterogeneously high signal intensity on T2 weighted images, heterogeneously intermediate to high signal intensity on pre gadolinium T1 weighted images, with some diffusion restriction, but no definite enhancement on post gadolinium images, presumably a proteinaceous postoperative seroma.  IMPRESSION: 1. Compared to the prior examination the numerous previously noted hypovascular liver lesions suspicious for metastatic lesions have either completely regressed or decreased in size substantially. No new hepatic lesions are noted. Continued attention on future followup studies is suggested.   Electronically Signed   By: Vinnie Langton M.D.   On: 12/04/2013 10:54     ASSESSMENT: 63 y.o. Carrie Berry, Carrie Berry woman status post right breast upper outer quadrant and right axillary lymph node biopsy 12/23/2012 for a clinical T3 N1 M1, stage IV invasive ductal carcinoma, grade 3, triple negative,  with an MIB-1 of 86%.  (1) right liver lobe biopsy 01/21/2013 confirms metastatic adenocarcinoma; scans show involvement of the liver, lungs, and likely bone.  (2) enrolled in [facility] study [id number] (docetaxel + oral gamma secretase inibitor [id number]), received one cycle starting 02/04/2013  but withdrew because of poor tolerance despite treatment interruptions and decreased dosing of the oral component  (3)  chest CT in early November 2014 was compared with studies in August 2014 and showed interval progression of metastatic breast cancer, with an enlarging primary right breast mass, enlarging right axillary lymph nodes, enlarging pulmonary nodules and new/enlarging hepatic metastases.  (4) Abraxane started 03/19/2013, given day 1 and day 8 of each 21 day cycle, stopped with the day 1 cycle 3 dose (04/29/2013) because of local progression of disease  (5) cyclophosphamide and doxorubicin started 05/20/2013, given in dose dense fashion  with Neulasta support on day 2. Completed 4 cycles 07/03/2013, with the final cycle dose reduced 15% because of an episode of febrile neutropenia after cycle 3. Restaging studies 07/15/2013 showed partial response.  (6) started capecitabine April 2015, 1.5 g by mouth twice a day, 7 days on 7 days off  (7) epigastric pain with negative EGD 08/19/2013 and no findings on scans , improved on omeprazole    PLAN: Sina is having a very good response to capecitabine. The main concern of course is the palmar plantar erythrodysesthesia. As far as the feet is concerned she is going to where open shoes her sandals. As far as the hands she is going to stop washing quite so much, used more alcohol gels, and wear gloves.  She very much wants to go back to school and I see no problems with that. Her only restriction is that she  should wear gloves. She has requested that I write a letter to the benefits specialist at the school and I will be glad to do that today.  Otherwise we are  continuing on capecitabine as before. We will continue to check lab work on day 1 of each cycle and she will see [place] with day 1 of every other cycle. If we can get through another 3 months of the current treatment, we will repeat a liver MRI at that time. At this point I am delighted that she is having such a good response. She knows to call for any problems that may develop before the next visit  Emalia Witkop C, MD   12/08/2013 11:56 AM

## 2013-12-09 ENCOUNTER — Other Ambulatory Visit: Payer: Self-pay | Admitting: Oncology

## 2013-12-15 ENCOUNTER — Other Ambulatory Visit: Payer: BC Managed Care – PPO

## 2013-12-18 ENCOUNTER — Other Ambulatory Visit: Payer: Self-pay | Admitting: Oncology

## 2013-12-19 ENCOUNTER — Other Ambulatory Visit: Payer: Self-pay

## 2013-12-19 DIAGNOSIS — C50919 Malignant neoplasm of unspecified site of unspecified female breast: Secondary | ICD-10-CM

## 2013-12-19 MED ORDER — OMEPRAZOLE 20 MG PO CPDR: DELAYED_RELEASE_CAPSULE | ORAL | Status: DC

## 2013-12-22 ENCOUNTER — Other Ambulatory Visit: Payer: BC Managed Care – PPO

## 2013-12-22 ENCOUNTER — Other Ambulatory Visit (HOSPITAL_BASED_OUTPATIENT_CLINIC_OR_DEPARTMENT_OTHER): Payer: BC Managed Care – PPO

## 2013-12-22 DIAGNOSIS — C50419 Malignant neoplasm of upper-outer quadrant of unspecified female breast: Secondary | ICD-10-CM

## 2013-12-22 DIAGNOSIS — C773 Secondary and unspecified malignant neoplasm of axilla and upper limb lymph nodes: Secondary | ICD-10-CM

## 2013-12-22 DIAGNOSIS — C50911 Malignant neoplasm of unspecified site of right female breast: Secondary | ICD-10-CM

## 2013-12-22 LAB — CBC WITH DIFFERENTIAL/PLATELET
BASO%: 0.5 % (ref 0.0–2.0)
Basophils Absolute: 0 10*3/uL (ref 0.0–0.1)
EOS%: 8.1 % — AB (ref 0.0–7.0)
Eosinophils Absolute: 0.5 10*3/uL (ref 0.0–0.5)
HCT: 36.5 % (ref 34.8–46.6)
HEMOGLOBIN: 12.2 g/dL (ref 11.6–15.9)
LYMPH%: 14.3 % (ref 14.0–49.7)
MCH: 34.3 pg — AB (ref 25.1–34.0)
MCHC: 33.4 g/dL (ref 31.5–36.0)
MCV: 102.8 fL — AB (ref 79.5–101.0)
MONO#: 0.4 10*3/uL (ref 0.1–0.9)
MONO%: 7.6 % (ref 0.0–14.0)
NEUT#: 4.1 10*3/uL (ref 1.5–6.5)
NEUT%: 69.5 % (ref 38.4–76.8)
Platelets: 205 10*3/uL (ref 145–400)
RBC: 3.55 10*6/uL — AB (ref 3.70–5.45)
RDW: 17.6 % — AB (ref 11.2–14.5)
WBC: 5.9 10*3/uL (ref 3.9–10.3)
lymph#: 0.8 10*3/uL — ABNORMAL LOW (ref 0.9–3.3)

## 2013-12-22 LAB — COMPREHENSIVE METABOLIC PANEL
ALBUMIN: 4.1 g/dL (ref 3.5–5.2)
ALK PHOS: 109 U/L (ref 39–117)
ALT: 25 U/L (ref 0–35)
AST: 23 U/L (ref 0–37)
BUN: 17 mg/dL (ref 6–23)
CALCIUM: 9.6 mg/dL (ref 8.4–10.5)
CHLORIDE: 104 meq/L (ref 96–112)
CO2: 28 mEq/L (ref 19–32)
Creatinine, Ser: 0.86 mg/dL (ref 0.50–1.10)
Glucose, Bld: 92 mg/dL (ref 70–99)
POTASSIUM: 4 meq/L (ref 3.5–5.3)
SODIUM: 144 meq/L (ref 135–145)
TOTAL PROTEIN: 7.2 g/dL (ref 6.0–8.3)
Total Bilirubin: 0.4 mg/dL (ref 0.2–1.2)

## 2013-12-29 ENCOUNTER — Other Ambulatory Visit: Payer: BC Managed Care – PPO

## 2013-12-30 ENCOUNTER — Telehealth: Payer: Self-pay | Admitting: *Deleted

## 2013-12-30 NOTE — Telephone Encounter (Signed)
This RN spoke with pt per her call stating onset of " my hands and feet are red and peeling ".  Carrie Berry states she is on her off week of xeloda.  Noted skin changes over the weekend " but then this AM when I woke up my hand were red like I had put my hands in hot water"  " my hands and feet itch at times and when I rub them the skin will peel off "  Per discussion Carrie Berry states she is returning to work and " want to know what to do ".  This RN discussed with pt use of thick lotions on palms and feet and wearing cotton socks and gloves if possible. Use of foot soaks for relief. Pt needs to avoid strappy or binding shoes that impair circulation.

## 2013-12-31 ENCOUNTER — Other Ambulatory Visit: Payer: Self-pay | Admitting: Oncology

## 2013-12-31 DIAGNOSIS — C50411 Malignant neoplasm of upper-outer quadrant of right female breast: Secondary | ICD-10-CM

## 2014-01-05 ENCOUNTER — Other Ambulatory Visit (HOSPITAL_BASED_OUTPATIENT_CLINIC_OR_DEPARTMENT_OTHER): Payer: BC Managed Care – PPO

## 2014-01-05 ENCOUNTER — Ambulatory Visit: Payer: BC Managed Care – PPO

## 2014-01-05 VITALS — BP 120/70 | HR 82 | Temp 98.4°F

## 2014-01-05 DIAGNOSIS — C787 Secondary malignant neoplasm of liver and intrahepatic bile duct: Secondary | ICD-10-CM

## 2014-01-05 DIAGNOSIS — C50419 Malignant neoplasm of upper-outer quadrant of unspecified female breast: Secondary | ICD-10-CM

## 2014-01-05 DIAGNOSIS — Z95828 Presence of other vascular implants and grafts: Secondary | ICD-10-CM

## 2014-01-05 DIAGNOSIS — C773 Secondary and unspecified malignant neoplasm of axilla and upper limb lymph nodes: Secondary | ICD-10-CM

## 2014-01-05 DIAGNOSIS — C7800 Secondary malignant neoplasm of unspecified lung: Secondary | ICD-10-CM

## 2014-01-05 DIAGNOSIS — C50911 Malignant neoplasm of unspecified site of right female breast: Secondary | ICD-10-CM

## 2014-01-05 LAB — CBC WITH DIFFERENTIAL/PLATELET
BASO%: 0.5 % (ref 0.0–2.0)
BASOS ABS: 0 10*3/uL (ref 0.0–0.1)
EOS%: 4.5 % (ref 0.0–7.0)
Eosinophils Absolute: 0.3 10*3/uL (ref 0.0–0.5)
HCT: 32.1 % — ABNORMAL LOW (ref 34.8–46.6)
HGB: 10.7 g/dL — ABNORMAL LOW (ref 11.6–15.9)
LYMPH#: 1.2 10*3/uL (ref 0.9–3.3)
LYMPH%: 15.9 % (ref 14.0–49.7)
MCH: 34.2 pg — AB (ref 25.1–34.0)
MCHC: 33.3 g/dL (ref 31.5–36.0)
MCV: 102.6 fL — AB (ref 79.5–101.0)
MONO#: 0.6 10*3/uL (ref 0.1–0.9)
MONO%: 7.3 % (ref 0.0–14.0)
NEUT#: 5.4 10*3/uL (ref 1.5–6.5)
NEUT%: 71.8 % (ref 38.4–76.8)
Platelets: 195 10*3/uL (ref 145–400)
RBC: 3.12 10*6/uL — ABNORMAL LOW (ref 3.70–5.45)
RDW: 17.4 % — AB (ref 11.2–14.5)
WBC: 7.5 10*3/uL (ref 3.9–10.3)

## 2014-01-05 LAB — COMPREHENSIVE METABOLIC PANEL (CC13)
ALT: 24 U/L (ref 0–55)
AST: 30 U/L (ref 5–34)
Albumin: 4 g/dL (ref 3.5–5.0)
Alkaline Phosphatase: 101 U/L (ref 40–150)
Anion Gap: 6 mEq/L (ref 3–11)
BUN: 18.1 mg/dL (ref 7.0–26.0)
CO2: 28 mEq/L (ref 22–29)
Calcium: 9.1 mg/dL (ref 8.4–10.4)
Chloride: 106 mEq/L (ref 98–109)
Creatinine: 0.8 mg/dL (ref 0.6–1.1)
Glucose: 80 mg/dl (ref 70–140)
Potassium: 4.1 mEq/L (ref 3.5–5.1)
Sodium: 139 mEq/L (ref 136–145)
Total Bilirubin: 0.44 mg/dL (ref 0.20–1.20)
Total Protein: 6.7 g/dL (ref 6.4–8.3)

## 2014-01-05 MED ORDER — SODIUM CHLORIDE 0.9 % IJ SOLN
10.0000 mL | INTRAMUSCULAR | Status: DC | PRN
  Administered 2014-01-05: 10 mL via INTRAVENOUS
  Filled 2014-01-05: qty 10

## 2014-01-05 MED ORDER — HEPARIN SOD (PORK) LOCK FLUSH 100 UNIT/ML IV SOLN
500.0000 [IU] | Freq: Once | INTRAVENOUS | Status: AC
  Administered 2014-01-05: 500 [IU] via INTRAVENOUS
  Filled 2014-01-05: qty 5

## 2014-01-05 NOTE — Patient Instructions (Signed)

## 2014-01-06 ENCOUNTER — Encounter: Payer: Self-pay | Admitting: Nurse Practitioner

## 2014-01-06 ENCOUNTER — Ambulatory Visit (HOSPITAL_BASED_OUTPATIENT_CLINIC_OR_DEPARTMENT_OTHER): Payer: BC Managed Care – PPO | Admitting: Nurse Practitioner

## 2014-01-06 VITALS — BP 136/76 | HR 84 | Temp 98.4°F | Resp 18 | Ht 64.0 in | Wt 122.7 lb

## 2014-01-06 DIAGNOSIS — Z171 Estrogen receptor negative status [ER-]: Secondary | ICD-10-CM

## 2014-01-06 DIAGNOSIS — C773 Secondary and unspecified malignant neoplasm of axilla and upper limb lymph nodes: Secondary | ICD-10-CM

## 2014-01-06 DIAGNOSIS — C7800 Secondary malignant neoplasm of unspecified lung: Secondary | ICD-10-CM

## 2014-01-06 DIAGNOSIS — R1013 Epigastric pain: Secondary | ICD-10-CM

## 2014-01-06 DIAGNOSIS — C50419 Malignant neoplasm of upper-outer quadrant of unspecified female breast: Secondary | ICD-10-CM

## 2014-01-06 DIAGNOSIS — B029 Zoster without complications: Secondary | ICD-10-CM

## 2014-01-06 DIAGNOSIS — C787 Secondary malignant neoplasm of liver and intrahepatic bile duct: Secondary | ICD-10-CM

## 2014-01-06 DIAGNOSIS — R21 Rash and other nonspecific skin eruption: Secondary | ICD-10-CM

## 2014-01-06 MED ORDER — VALACYCLOVIR HCL 1 G PO TABS: 1000.0000 mg | ORAL_TABLET | Freq: Three times a day (TID) | ORAL | Status: DC

## 2014-01-06 NOTE — Progress Notes (Signed)
. Patient ID: Carrie Berry, female   DOB: 1950-09-03, 63 y.o.   MRN: [id number] ID: Carrie Berry OB: 03-30-1951  MR#: [id number]  WCH#:[id number]  PCP: [name], MD GYN:   Carrie: [name] OTHER MD: Thea Silversmith, Earle Gell, Rick Cornella, Erline Levine  CHIEF COMPLAINT:  Metastatic Breast Cancer CURRENT TREATMENT: capecitabine  BREAST CANCER HISTORY: From the originally intake note:  Carrie Berry noted a mass in her right breast December of 2013, but did not think much of it. It did grow some lower the summer, but she was keeping her grandchild at that time and was too busy so she did not bring it to Dr. Bernell List attention until August. He set her up for bilateral mammography and ultrasonography at [facility] 12/18/2012 and this measured, on the right, a large irregular lobulated mass in the upper outer quadrant which by ultrasound measured 5.7 cm. The right axilla showed some lymph nodes with thickened cortices. In the left breast there was a focal asymmetry at the depth, lateral to the nipple, and by ultrasound there was a 9 mm ill-defined hypoechoic lesion in this location. Biopsy of the left breast lesion showed only fibrocystic changes.  Biopsy of the right breast mass and right axillary adenopathy ([id number]) on 12/23/2012 showed both to be involved by invasive ductal carcinoma, grade 2, triple negative, with an MIB-1 of 86%.  MRI of the breast 12/27/2012 at [facility] imaging showed in the right breast a mass abutting the pectoralis muscle without enhancement of the muscle measuring 4.8 cm. The satellite nodule measuring approximately 3 mm was also noted superior to the mass and several abnormal and enlarged right axillary lymph nodes were noted, one of which appeared to be necrotic. The largest node measured 2.0 cm. Unfortunately, numerous bilateral pulmonary nodules were also noted.  The patient's subsequent history is as detailed below   INTERVAL HISTORY: Carrie Berry returns alone today  for follow up of her metastatic breast cancer. She continues on capecitabine, 3 tablets twice daily, one week on and one-week off, today being day 2 cycle 11 of her 2 week cycles. She has tolerated the capecitabine well, minus the dryness in her hands. Her right thumb as begun to peel and crack. She denies mouth sores. She has shooting pains in her right breast, and some bruising that has appeared in the last couple of weeks that concerns her. She also has a pruritic rash on her right and left flank for the past couple of days that concerns her for shingles.   REVIEW OF SYSTEMS Carrie Berry denies fevers, chills, nausea, vomiting, or changes in bowel habits. She wears open toed shoes to reduce the friction on her feet. She has changed her diet and doesn't notice as much acid reflux as a result. She wears an "ultrasoft" bra so that it doesn't interfere with her intermittent breast pain. A detailed review of systems was otherwise negative.   PAST MEDICAL HISTORY: Past Medical History  Diagnosis Date  . Recurrent sinus infections   . Arthritis   . PONV (postoperative nausea and vomiting)   . Anxiety   . GERD (gastroesophageal reflux disease)   . 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 rt breast ca dx'd 12/2011    breast  . Breast cancer   . 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 Tubal ligation    . Tubal ligation  1985  .  Portacath placement N/A 01/10/2013    Procedure: ULTRASOUND GUIDED PORT-A-CATH INSERTION WITH FLUOROSCOPY;  Surgeon: [name], MD;  Location: [place];  Service: General;  Laterality: N/A;  . Esophagogastroduodenoscopy N/A 08/19/2013    Procedure: ESOPHAGOGASTRODUODENOSCOPY (EGD);  Surgeon: Garlan Fair, MD;  Location: [facility];  Service: Endoscopy;  Laterality: N/A;    FAMILY HISTORY Family History  Problem Relation Age of Onset  . Bladder  Cancer Father   . Colon cancer Maternal Grandmother    the patient's parents are living, both in their 59s. The patient's father was diagnosed with bladder cancer the age of 61. The patient's mother's mother had some type of gastrointestinal cancer. The patient had one brother, no sisters. There is no history of breast or ovarian cancer in the family other than a cousin on the father's side who was diagnosed with breast cancer apparently before the age of 92.  GYNECOLOGIC HISTORY:   (Reviewed 10/13/2013) Menarche age 63, first live birth age 64, the patient is Carrie Berry P2. She went through menopause in 2008. She did not take hormone replacement. She took birth control for approximately 22 years remotely.  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 She's currently on disability. Her husband Shaneeka Scarboro [name]"), is vice Development worker, international aid for [employer]. The patient's daughter [name] is a stay-at-home mom in [place], the patient's son Nakea Gouger unfortunately died in an automobile accident in December of 2011. The patient has 2 grandchildren. She attends to a local [employer].    ADVANCED DIRECTIVES: Not in place   HEALTH MAINTENANCE: (Updated 10/13/2013) History  Substance Use Topics  . Smoking status: Never Smoker   . Smokeless tobacco: Never Used  . Alcohol Use: No     Colonoscopy: 2005  PAP: Not on file  Bone density: Not on file  Lipid panel:  Not on file/Dr. Polite   Allergies  Allergen Reactions  . Ondansetron Hcl Other (See Comments)    HEADACHE, patient states she still takes   . Codeine Nausea And Vomiting    "violently ill"  . Penicillins Rash    Childhood reaction -    Current Outpatient Prescriptions  Medication Sig Dispense Refill  . acetaminophen (TYLENOL) 325 MG tablet Take 650 mg by mouth every 8 (eight) hours as needed for pain.      Marland Kitchen  aspirin-acetaminophen-caffeine (EXCEDRIN MIGRAINE) 250-250-65 MG per tablet Take 1 tablet by mouth every 6 (six) hours as needed for headache.      . capecitabine (XELODA) 500 MG tablet Take 3 tablets (1,500 mg total) by mouth 2 (two) times daily after a meal.  84 tablet  4  . diazepam (VALIUM) 5 MG tablet Take 1 tablet 1 hour before exam and then may repeat x 1 if needed  2 tablet  0  . lidocaine-prilocaine (EMLA) cream Apply topically as needed.  30 g  3  . LORazepam (ATIVAN) 0.5 MG tablet Take 1 tablet (0.5 mg total) by mouth every 8 (eight) hours as needed for anxiety.  15 tablet  0  . omeprazole (PRILOSEC) 20 MG capsule TAKE 1 CAPSULE (20 MG TOTAL) BY MOUTH 2 (TWO) TIMES DAILY.  60 capsule  1  . ondansetron (ZOFRAN) 8 MG tablet Take 8 mg by mouth every 8 (eight) hours as needed for nausea or vomiting.      Marland Kitchen PATADAY 0.2 % SOLN Place 1 drop into both eyes daily  as needed (dry eyes).       . prochlorperazine (COMPAZINE) 10 MG tablet Take 1 tablet (10 mg total) by mouth every 6 (six) hours as needed for nausea.  30 tablet  2  . promethazine (PHENERGAN) 25 MG tablet       . tobramycin-dexamethasone (TOBRADEX) ophthalmic solution Place 1 drop into both eyes daily.  5 mL  1  . traMADol (ULTRAM) 50 MG tablet Take 2 tablets (100 mg total) by mouth every 6 (six) hours as needed for moderate pain.  120 tablet  0  . traZODone (DESYREL) 50 MG tablet TAKE 1 TO 2 TABLETS BY MOUTH AT BEDTIME FOR SLEEP  60 tablet  3  . triamcinolone (NASACORT) 55 MCG/ACT nasal inhaler Place 2 sprays into the nose daily.      Marland Kitchen zolpidem (AMBIEN) 5 MG tablet TAKE 1 TABLET BY MOUTH AT BEDTIME FOR SLEEP  20 tablet  1   No current facility-administered medications for this visit.    OBJECTIVE: Middle-aged white woman in no acute distress Filed Vitals:   01/06/14 1500  BP: 136/76  Pulse: 84  Temp: 98.4 F (36.9 C)  Resp: 18     Body mass index is 21.05 kg/(m^2).    ECOG FS: 1 Filed Weights   01/06/14 1500  Weight: 122  lb 11.2 oz (55.656 kg)    HEENT: sclerae anicteric, conjunctivae pink, oropharynx clear. No thrush or mucositis.  Lymph Nodes: No cervical or supraclavicular lymphadenopathy  Lungs: clear to auscultation bilaterally, no rales, wheezes, or rhonci  Heart: regular rate and rhythm  Abdomen: round, soft, non tender, positive bowel sounds  Musculoskeletal: No focal spinal tenderness, no peripheral edema  Neuro: non focal, well oriented, positive affect  Breasts: right breast palpable mass about 3 cm in diameter, firm, bruising noted over mass, right axilla benign. Left breast unremarkable Skin: erythema to palms of hands and soles of feet, dry, peeling occuring to tip of right thumb. Bilateral flank rash as pictured below.          LAB RESULTS:   Lab Results  Component Value Date   WBC 7.5 01/05/2014   NEUTROABS 5.4 01/05/2014   HGB 10.7* 01/05/2014   HCT 32.1* 01/05/2014   MCV 102.6* 01/05/2014   PLT 195 01/05/2014      Chemistry      Component Value Date/Time   NA 139 01/05/2014 1519   NA 144 12/22/2013 0919   K 4.1 01/05/2014 1519   K 4.0 12/22/2013 0919   CL 104 12/22/2013 0919   CO2 28 01/05/2014 1519   CO2 28 12/22/2013 0919   BUN 18.1 01/05/2014 1519   BUN 17 12/22/2013 0919   CREATININE 0.8 01/05/2014 1519   CREATININE 0.86 12/22/2013 0919      Component Value Date/Time   CALCIUM 9.1 01/05/2014 1519   CALCIUM 9.6 12/22/2013 0919   ALKPHOS 101 01/05/2014 1519   ALKPHOS 109 12/22/2013 0919   AST 30 01/05/2014 1519   AST 23 12/22/2013 0919   ALT 24 01/05/2014 1519   ALT 25 12/22/2013 0919   BILITOT 0.44 01/05/2014 1519   BILITOT 0.4 12/22/2013 0919        STUDIES: Mr Liver W Wo Contrast  12-13-2013   CLINICAL DATA:  History breast cancer. Evaluate for metastatic disease. Evaluate for response to chemotherapy or disease progression.  EXAM: MRI ABDOMEN WITHOUT AND WITH CONTRAST  TECHNIQUE: Multiplanar multisequence MR imaging of the abdomen was performed both before and after the  administration  of intravenous contrast.  CONTRAST:  33mL MULTIHANCE GADOBENATE DIMEGLUMINE 529 MG/ML IV SOLN  COMPARISON:  MRI of the abdomen 09/24/2013.  FINDINGS: As with the prior examination there are several subtle hypovascular lesions scattered throughout the liver, however, these are generally smaller and less apparent than the prior study. Specifically, the cluster of small lesions in segment 7 of the liver demonstrated on prior study are essentially completely resolved (image 28 of series 1102). Previously noted 8 mm lesion in segment 6 of the liver currently measures only 5 mm (image 51 of series 1102). Likewise, the previously noted 5 mm lesion in the subcapsular aspect of segment 6 of the liver is currently only 3 mm (image 46 of series 110 to the). No other new or enlarging hepatic lesion is noted.  The appearance of the gallbladder, pancreas, spleen, bilateral adrenal glands and bilateral kidneys is unremarkable.  Chronic postoperative fluid collection in the lateral aspect of the right breast is similar to the prior examination appearing heterogeneously high signal intensity on T2 weighted images, heterogeneously intermediate to high signal intensity on pre gadolinium T1 weighted images, with some diffusion restriction, but no definite enhancement on post gadolinium images, presumably a proteinaceous postoperative seroma.  IMPRESSION: 1. Compared to the prior examination the numerous previously noted hypovascular liver lesions suspicious for metastatic lesions have either completely regressed or decreased in size substantially. No new hepatic lesions are noted. Continued attention on future followup studies is suggested.   Electronically Signed   By: Vinnie Langton M.D.   On: 12/04/2013 10:54    ASSESSMENT: 63 y.o. Carrie Berry, [place] woman status post right breast upper outer quadrant and right axillary lymph node biopsy 12/23/2012 for a clinical T3 N1 M1, stage IV invasive ductal carcinoma, grade 3,  triple negative, with an MIB-1 of 86%.  (1) right liver lobe biopsy 01/21/2013 confirms metastatic adenocarcinoma; scans show involvement of the liver, lungs, and likely bone.  (2) enrolled in [facility] study [id number] (docetaxel + oral gamma secretase inibitor [id number]), received one cycle starting 02/04/2013  but withdrew because of poor tolerance despite treatment interruptions and decreased dosing of the oral component  (3)  chest CT in early November 2014 was compared with studies in August 2014 and showed interval progression of metastatic breast cancer, with an enlarging primary right breast mass, enlarging right axillary lymph nodes, enlarging pulmonary nodules and new/enlarging hepatic metastases.  (4) Abraxane started 03/19/2013, given day 1 and day 8 of each 21 day cycle, stopped with the day 1 cycle 3 dose (04/29/2013) because of local progression of disease  (5) cyclophosphamide and doxorubicin started 05/20/2013, given in dose dense fashion  with Neulasta support on day 2. Completed 4 cycles 07/03/2013, with the final cycle dose reduced 15% because of an episode of febrile neutropenia after cycle 3. Restaging studies 07/15/2013 showed partial response.  (6) started capecitabine April 2015, 1.5 g by mouth twice a day, 7 days on 7 days off  (7) epigastric pain with negative EGD 08/19/2013 and no findings on scans , improved on omeprazole    PLAN: Maleny is doing well with the capecitabine, besides the palmar plantar erythrodysesthesia. She has been encouraged to continue to wear open shoes and begin wearing gloves as much as possible. The labs were reviewed in detail with her and were stable. She will continue to monitor the mass in her right breast, especially the small bruise noted, but this is unconcerning to her at this time, and the soft bra helps.   The  etiology of the rash is unclear. Because it is bilateral, there a smaller chance that it is shingles, than if it were unilateral.  Valacyclovir 1000mg  TID has been prescribed x7 days as coverage anyway, as it is well tolerated. Carrie Berry will call in a week if the rash has not improved or become worse.   She will continue with the same dose of capcitebine, and continue labs at the start of every cycle and office visits at the start of every other cycle. We will repeat a liver MRI at the end of October. Carrie Berry understands and agrees with the plan. She has been encouraged to call with any issues that might arise before her next visit here.   Marcelino Duster, NP   01/06/2014 3:24 PM

## 2014-01-12 ENCOUNTER — Other Ambulatory Visit: Payer: Self-pay | Admitting: *Deleted

## 2014-01-12 NOTE — Telephone Encounter (Signed)
THIS REFILL REQUEST FOR CAPECITABINE WAS GIVEN TO DR.MAGRINAT'S NURSE, STACEY CAMP,RN.

## 2014-01-14 ENCOUNTER — Other Ambulatory Visit: Payer: Self-pay | Admitting: Emergency Medicine

## 2014-01-14 MED ORDER — CAPECITABINE 500 MG PO TABS: 1000.0000 mg/m2 | ORAL_TABLET | Freq: Two times a day (BID) | ORAL | Status: DC

## 2014-01-16 ENCOUNTER — Other Ambulatory Visit: Payer: Self-pay | Admitting: *Deleted

## 2014-01-20 ENCOUNTER — Telehealth: Payer: Self-pay | Admitting: Oncology

## 2014-01-20 ENCOUNTER — Other Ambulatory Visit (HOSPITAL_BASED_OUTPATIENT_CLINIC_OR_DEPARTMENT_OTHER): Payer: BC Managed Care – PPO

## 2014-01-20 DIAGNOSIS — C50419 Malignant neoplasm of upper-outer quadrant of unspecified female breast: Secondary | ICD-10-CM

## 2014-01-20 DIAGNOSIS — C50911 Malignant neoplasm of unspecified site of right female breast: Secondary | ICD-10-CM

## 2014-01-20 LAB — CBC WITH DIFFERENTIAL/PLATELET
BASO%: 0.5 % (ref 0.0–2.0)
Basophils Absolute: 0 10*3/uL (ref 0.0–0.1)
EOS ABS: 0.4 10*3/uL (ref 0.0–0.5)
EOS%: 4.6 % (ref 0.0–7.0)
HCT: 34.1 % — ABNORMAL LOW (ref 34.8–46.6)
HEMOGLOBIN: 11.5 g/dL — AB (ref 11.6–15.9)
LYMPH#: 1.3 10*3/uL (ref 0.9–3.3)
LYMPH%: 16.9 % (ref 14.0–49.7)
MCH: 34.4 pg — AB (ref 25.1–34.0)
MCHC: 33.7 g/dL (ref 31.5–36.0)
MCV: 102.1 fL — ABNORMAL HIGH (ref 79.5–101.0)
MONO#: 0.5 10*3/uL (ref 0.1–0.9)
MONO%: 6.5 % (ref 0.0–14.0)
NEUT%: 71.5 % (ref 38.4–76.8)
NEUTROS ABS: 5.7 10*3/uL (ref 1.5–6.5)
Platelets: 176 10*3/uL (ref 145–400)
RBC: 3.34 10*6/uL — ABNORMAL LOW (ref 3.70–5.45)
RDW: 16.4 % — AB (ref 11.2–14.5)
WBC: 7.9 10*3/uL (ref 3.9–10.3)
nRBC: 0 % (ref 0–0)

## 2014-01-20 LAB — COMPREHENSIVE METABOLIC PANEL (CC13)
ALBUMIN: 3.7 g/dL (ref 3.5–5.0)
ALT: 22 U/L (ref 0–55)
AST: 23 U/L (ref 5–34)
Alkaline Phosphatase: 90 U/L (ref 40–150)
Anion Gap: 8 mEq/L (ref 3–11)
BILIRUBIN TOTAL: 0.48 mg/dL (ref 0.20–1.20)
BUN: 23.7 mg/dL (ref 7.0–26.0)
CO2: 26 mEq/L (ref 22–29)
Calcium: 9.1 mg/dL (ref 8.4–10.4)
Chloride: 105 mEq/L (ref 98–109)
Creatinine: 0.8 mg/dL (ref 0.6–1.1)
Glucose: 96 mg/dl (ref 70–140)
Potassium: 4.3 mEq/L (ref 3.5–5.1)
SODIUM: 139 meq/L (ref 136–145)
TOTAL PROTEIN: 6.6 g/dL (ref 6.4–8.3)

## 2014-01-20 NOTE — Telephone Encounter (Signed)
, °

## 2014-01-26 ENCOUNTER — Ambulatory Visit (HOSPITAL_BASED_OUTPATIENT_CLINIC_OR_DEPARTMENT_OTHER): Payer: BC Managed Care – PPO | Admitting: Oncology

## 2014-01-26 ENCOUNTER — Telehealth: Payer: Self-pay | Admitting: Oncology

## 2014-01-26 VITALS — BP 131/75 | HR 79 | Temp 97.6°F | Resp 20 | Ht 64.0 in | Wt 122.8 lb

## 2014-01-26 DIAGNOSIS — C773 Secondary and unspecified malignant neoplasm of axilla and upper limb lymph nodes: Secondary | ICD-10-CM

## 2014-01-26 DIAGNOSIS — C7800 Secondary malignant neoplasm of unspecified lung: Secondary | ICD-10-CM

## 2014-01-26 DIAGNOSIS — C50911 Malignant neoplasm of unspecified site of right female breast: Secondary | ICD-10-CM | POA: Insufficient documentation

## 2014-01-26 DIAGNOSIS — C50419 Malignant neoplasm of upper-outer quadrant of unspecified female breast: Secondary | ICD-10-CM

## 2014-01-26 DIAGNOSIS — Z171 Estrogen receptor negative status [ER-]: Secondary | ICD-10-CM

## 2014-01-26 DIAGNOSIS — C787 Secondary malignant neoplasm of liver and intrahepatic bile duct: Secondary | ICD-10-CM

## 2014-01-26 NOTE — Addendum Note (Signed)
Addended by: Renford Dills on: 01/26/2014 12:22 PM   Modules accepted: Medications

## 2014-01-26 NOTE — Telephone Encounter (Signed)
, °

## 2014-01-26 NOTE — Progress Notes (Signed)
. Patient ID: Carrie Berry, female   DOB: 28-Feb-1951, 63 y.o.   MRN: [id number] ID: Carrie Berry OB: [birth year]  MR#: [id number]  CSN#:[id number]  PCP: Carrie Hams, MD GYN:   SU: Carrie Berry OTHER MD: Carrie Berry, Carrie Berry, Carrie Berry, Carrie Berry  CHIEF COMPLAINT:  Metastatic Breast Cancer CURRENT TREATMENT: capecitabine  BREAST CANCER HISTORY: From the originally intake note:  Carrie Berry noted a mass in her right breast December of 2013, but did not think much of it. It did grow some lower the summer, but she was keeping her grandchild at that time and was too busy so she did not bring it to Dr. Bernell List attention until August. He set her up for bilateral mammography and ultrasonography at [facility] 12/18/2012 and this measured, on the right, a large irregular lobulated mass in the upper outer quadrant which by ultrasound measured 5.7 cm. The right axilla showed some lymph nodes with thickened cortices. In the left breast there was a focal asymmetry at the depth, lateral to the nipple, and by ultrasound there was a 9 mm ill-defined hypoechoic lesion in this location. Biopsy of the left breast lesion showed only fibrocystic changes.  Biopsy of the right breast mass and right axillary adenopathy ([id number]) on 12/23/2012 showed both to be involved by invasive ductal carcinoma, grade 2, triple negative, with an MIB-1 of 86%.  MRI of the breast 12/27/2012 at [facility] imaging showed in the right breast a mass abutting the pectoralis muscle without enhancement of the muscle measuring 4.8 cm. The satellite nodule measuring approximately 3 mm was also noted superior to the mass and several abnormal and enlarged right axillary lymph nodes were noted, one of which appeared to be necrotic. The largest node measured 2.0 cm. Unfortunately, numerous bilateral pulmonary nodules were also noted.  The patient's subsequent history is as detailed below   INTERVAL HISTORY: Carrie Berry returns today for  follow up of her metastatic breast cancer. She continues on capecitabine, 3 tablets twice daily, one week on and one-week off, today being day 8 cycle 12 of her 2 week cycles. She is having moderate palmar plantar erythrodysesthesia. She handles this with lotions, and wearing very comfortable shoes. This problem is "manageable". She has had perhaps one very small mouth sore, and no problems with diarrhea. She continues to work full-time  REVIEW OF SYSTEMS Carrie Berry is more concerned about pain in her right breast. This is new or at any rate more prominent than before. It feels like a needle is being inserted and removed and it keeps her from sleeping at night. She thinks the main lesion in the right breast is a little bit redder and maybe a little bit larger. Incidentally the rash on her left flank is almost completely resolved. A detailed review of systems today was otherwise noncontributory  PAST MEDICAL HISTORY: Past Medical History  Diagnosis Date  . Recurrent sinus infections   . Arthritis   . PONV (postoperative nausea and vomiting)   . Anxiety   . GERD (gastroesophageal reflux disease)   . 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 rt breast ca dx'd 12/2011    breast  . Breast cancer   . 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 Tubal ligation    . Tubal ligation  1985  . Portacath placement N/A 01/10/2013    Procedure: ULTRASOUND GUIDED PORT-A-CATH INSERTION WITH FLUOROSCOPY;  Surgeon: [name], MD;  Location: [place];  Service: General;  Laterality: N/A;  . Esophagogastroduodenoscopy N/A 08/19/2013    Procedure: ESOPHAGOGASTRODUODENOSCOPY (EGD);  Surgeon: Garlan Fair, MD;  Location: [facility];  Service: Endoscopy;  Laterality: N/A;    FAMILY HISTORY Family History  Problem Relation Age of Onset  . Bladder Cancer Father   . Colon cancer Maternal  Grandmother    the patient's parents are living, both in their 81s. The patient's father was diagnosed with bladder cancer the age of 23. The patient's mother's mother had some type of gastrointestinal cancer. The patient had one brother, no sisters. There is no history of breast or ovarian cancer in the family other than a cousin on the father's side who was diagnosed with breast cancer apparently before the age of 50.  GYNECOLOGIC HISTORY:   (Reviewed 10/13/2013) Menarche age 23, first live birth age 59, the patient is [place] P2. She went through menopause in 2008. She did not take hormone replacement. She took birth control for approximately 22 years remotely.  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 She's currently on disability. Her husband Carrie Berry"), is vice Development worker, international aid for [employer]. The patient's daughter Carrie Berry is a stay-at-home mom in [place], the patient's son Carrie Berry unfortunately died in an automobile accident in December of 2011. The patient has 2 grandchildren. She attends to a local [employer].    ADVANCED DIRECTIVES: Not in place   HEALTH MAINTENANCE: (Updated 10/13/2013) History  Substance Use Topics  . Smoking status: Never Smoker   . Smokeless tobacco: Never Used  . Alcohol Use: No     Colonoscopy: 2005  PAP: Not on file  Bone density: Not on file  Lipid panel:  Not on file/Dr. Polite   Allergies  Allergen Reactions  . Ondansetron Hcl Other (See Comments)    HEADACHE, patient states she still takes   . Codeine Nausea And Vomiting    "violently ill"  . Penicillins Rash    Childhood reaction -    Current Outpatient Prescriptions  Medication Sig Dispense Refill  . acetaminophen (TYLENOL) 325 MG tablet Take 650 mg by mouth every 8 (eight) hours as needed for pain.      Marland Kitchen aspirin-acetaminophen-caffeine (EXCEDRIN  MIGRAINE) 250-250-65 MG per tablet Take 1 tablet by mouth every 6 (six) hours as needed for headache.      . capecitabine (XELODA) 500 MG tablet Take 3 tablets (1,500 mg total) by mouth 2 (two) times daily after a meal.  84 tablet  4  . diazepam (VALIUM) 5 MG tablet Take 1 tablet 1 hour before exam and then may repeat x 1 if needed  2 tablet  0  . lidocaine-prilocaine (EMLA) cream Apply topically as needed.  30 g  3  . LORazepam (ATIVAN) 0.5 MG tablet Take 1 tablet (0.5 mg total) by mouth every 8 (eight) hours as needed for anxiety.  15 tablet  0  . omeprazole (PRILOSEC) 20 MG capsule TAKE 1 CAPSULE (20 MG TOTAL) BY MOUTH 2 (TWO) TIMES DAILY.  60 capsule  1  . ondansetron (ZOFRAN) 8 MG tablet Take 8 mg by mouth every 8 (eight) hours as needed for nausea or vomiting.      Marland Kitchen PATADAY 0.2 % SOLN Place 1 drop into both eyes daily as needed (dry eyes).       . prochlorperazine (COMPAZINE) 10 MG  tablet Take 1 tablet (10 mg total) by mouth every 6 (six) hours as needed for nausea.  30 tablet  2  . promethazine (PHENERGAN) 25 MG tablet       . tobramycin-dexamethasone (TOBRADEX) ophthalmic solution Place 1 drop into both eyes daily.  5 mL  1  . traMADol (ULTRAM) 50 MG tablet Take 2 tablets (100 mg total) by mouth every 6 (six) hours as needed for moderate pain.  120 tablet  0  . traZODone (DESYREL) 50 MG tablet TAKE 1 TO 2 TABLETS BY MOUTH AT BEDTIME FOR SLEEP  60 tablet  3  . triamcinolone (NASACORT) 55 MCG/ACT nasal inhaler Place 2 sprays into the nose daily.      . valACYclovir (VALTREX) 1000 MG tablet Take 1 tablet (1,000 mg total) by mouth 3 (three) times daily.  21 tablet  0  . zolpidem (AMBIEN) 5 MG tablet TAKE 1 TABLET BY MOUTH AT BEDTIME FOR SLEEP  20 tablet  1   No current facility-administered medications for this visit.    OBJECTIVE: Middle-aged white woman who appears stated age 16 Vitals:   01/26/14 1033  BP: 131/75  Pulse: 79  Temp: 97.6 F (36.4 C)  Resp: 20     Body mass index  is 21.07 kg/(m^2).    ECOG FS: 1 Filed Weights   01/26/14 1033  Weight: 122 lb 12.8 oz (55.702 kg)    Sclerae unicteric, pupils equal and reactive Oropharynx clear and moist-- no lesions noted No cervical or supraclavicular adenopathy Lungs no rales or rhonchi Heart regular rate and rhythm Abd soft, nontender, positive bowel sounds MSK no focal spinal tenderness, no upper extremity lymphedema Neuro: nonfocal, well oriented, appropriate affect Breasts: The right breast lesion, previously noted and photographed, maybe possibly slightly more prominent, but I strongly would not have been able to detect this. There is minimal area of incrustation immediately adjacent to the larger mass. The whole abnormal area measures perhaps 4 cm. The breast is movable. I do not palpate any axillary adenopathy.   There may have been a bit of change in the visible disease in the right breast, with more redness and possibly a little bit of growth in the central lesion. This was photographed below.    On the other hand her left flank rash has just about resolved         LAB RESULTS:   Lab Results  Component Value Date   WBC 7.9 01/20/2014   NEUTROABS 5.7 01/20/2014   HGB 11.5* 01/20/2014   HCT 34.1* 01/20/2014   MCV 102.1* 01/20/2014   PLT 176 01/20/2014      Chemistry      Component Value Date/Time   NA 139 01/20/2014 1456   NA 144 12/22/2013 0919   K 4.3 01/20/2014 1456   K 4.0 12/22/2013 0919   CL 104 12/22/2013 0919   CO2 26 01/20/2014 1456   CO2 28 12/22/2013 0919   BUN 23.7 01/20/2014 1456   BUN 17 12/22/2013 0919   CREATININE 0.8 01/20/2014 1456   CREATININE 0.86 12/22/2013 0919      Component Value Date/Time   CALCIUM 9.1 01/20/2014 1456   CALCIUM 9.6 12/22/2013 0919   ALKPHOS 90 01/20/2014 1456   ALKPHOS 109 12/22/2013 0919   AST 23 01/20/2014 1456   AST 23 12/22/2013 0919   ALT 22 01/20/2014 1456   ALT 25 12/22/2013 0919   BILITOT 0.48 01/20/2014 1456   BILITOT 0.4 12/22/2013 0919  STUDIES: Mr Liver W Wo Contrast  28-Dec-2013   CLINICAL DATA:  History breast cancer. Evaluate for metastatic disease. Evaluate for response to chemotherapy or disease progression.  EXAM: MRI ABDOMEN WITHOUT AND WITH CONTRAST  TECHNIQUE: Multiplanar multisequence MR imaging of the abdomen was performed both before and after the administration of intravenous contrast.  CONTRAST:  59mL MULTIHANCE GADOBENATE DIMEGLUMINE 529 MG/ML IV SOLN  COMPARISON:  MRI of the abdomen 09/24/2013.  FINDINGS: As with the prior examination there are several subtle hypovascular lesions scattered throughout the liver, however, these are generally smaller and less apparent than the prior study. Specifically, the cluster of small lesions in segment 7 of the liver demonstrated on prior study are essentially completely resolved (image 28 of series 1102). Previously noted 8 mm lesion in segment 6 of the liver currently measures only 5 mm (image 51 of series 1102). Likewise, the previously noted 5 mm lesion in the subcapsular aspect of segment 6 of the liver is currently only 3 mm (image 46 of series 110 to the). No other new or enlarging hepatic lesion is noted.  The appearance of the gallbladder, pancreas, spleen, bilateral adrenal glands and bilateral kidneys is unremarkable.  Chronic postoperative fluid collection in the lateral aspect of the right breast is similar to the prior examination appearing heterogeneously high signal intensity on T2 weighted images, heterogeneously intermediate to high signal intensity on pre gadolinium T1 weighted images, with some diffusion restriction, but no definite enhancement on post gadolinium images, presumably a proteinaceous postoperative seroma.  IMPRESSION: 1. Compared to the prior examination the numerous previously noted hypovascular liver lesions suspicious for metastatic lesions have either completely regressed or decreased in size substantially. No new hepatic lesions are noted.  Continued attention on future followup studies is suggested.   Electronically Signed   By: Vinnie Langton M.D.   On: 12/28/13 10:54    ASSESSMENT: 63 y.o. [name], [place] woman status post right breast upper outer quadrant and right axillary lymph node biopsy 12/23/2012 for a clinical T3 N1 M1, stage IV invasive ductal carcinoma, grade 3, triple negative, with an MIB-1 of 86%.  (1) right liver lobe biopsy 01/21/2013 confirms metastatic adenocarcinoma; scans show involvement of the liver, lungs, and likely bone.  (2) enrolled in [facility] study [id number] (docetaxel + oral gamma secretase inibitor [id number]), received one cycle starting 02/04/2013  but withdrew because of poor tolerance despite treatment interruptions and decreased dosing of the oral component  (3)  chest CT in early November 2014 was compared with studies in August 2014 and showed interval progression of metastatic breast cancer, with an enlarging primary right breast mass, enlarging right axillary lymph nodes, enlarging pulmonary nodules and new/enlarging hepatic metastases.  (4) Abraxane started 03/19/2013, given day 1 and day 8 of each 21 day cycle, stopped with the day 1 cycle 3 dose (04/29/2013) because of local progression of disease  (5) cyclophosphamide and doxorubicin started 05/20/2013, given in dose dense fashion  with Neulasta support on day 2. Completed 4 cycles 07/03/2013, with the final cycle dose reduced 15% because of an episode of febrile neutropenia after cycle 3. Restaging studies 07/15/2013 showed partial response.  (6) started capecitabine April 2015, 1.5 g by mouth twice a day, 7 days on 7 days off  (7) epigastric pain with negative EGD 08/19/2013 and no findings on scans , improved on omeprazole    PLAN: Raissa is managing to tolerate the capecitabine, although she certainly has "Xeloda hands". The plan there is to continue one-week on  one-week off at the same dose, and her next cycle, the 13th, we'll start  September 21.  She is having more symptoms in the right breast. This may be the right time to consider a right simple mastectomy. Generally we don't do axillary exploration or resection in patients with stage IV disease and she is having no symptoms in the axilla and actually no palpable right axillary disease. Accordingly I am setting her up for a breast MRI and for a visit with Dr. Zella Richer to consider a right simple mastectomy for palliative reasons  I have canceled case appointment next week, since she saw me today, but she will "drop in" to see me when she gets her labwork on September 21 and update me on the plans. Otherwise she will see [place] again in October 5 when she comes for cycle #14.  Reisha has a good understanding of the overall plan. She agrees with it. She knows the goal of treatment in her case is control. She will call with any problems that may develop before her next visit.  Chauncey Cruel, MD   01/26/2014 10:53 AM

## 2014-01-31 ENCOUNTER — Ambulatory Visit (HOSPITAL_COMMUNITY)
Admission: RE | Admit: 2014-01-31 | Discharge: 2014-01-31 | Disposition: A | Discharge status: Home / Self Care | Payer: BC Managed Care – PPO | Source: Ambulatory Visit | Attending: Oncology | Admitting: Oncology

## 2014-01-31 DIAGNOSIS — Z79899 Other long term (current) drug therapy: Secondary | ICD-10-CM | POA: Diagnosis not present

## 2014-01-31 DIAGNOSIS — C50919 Malignant neoplasm of unspecified site of unspecified female breast: Secondary | ICD-10-CM | POA: Diagnosis not present

## 2014-01-31 DIAGNOSIS — C50911 Malignant neoplasm of unspecified site of right female breast: Secondary | ICD-10-CM

## 2014-01-31 MED ORDER — GADOBENATE DIMEGLUMINE 529 MG/ML IV SOLN
11.0000 mL | Freq: Once | INTRAVENOUS | Status: AC | PRN
  Administered 2014-01-31: 11 mL via INTRAVENOUS

## 2014-02-02 ENCOUNTER — Other Ambulatory Visit (HOSPITAL_BASED_OUTPATIENT_CLINIC_OR_DEPARTMENT_OTHER): Payer: BC Managed Care – PPO

## 2014-02-02 ENCOUNTER — Ambulatory Visit: Payer: BC Managed Care – PPO | Admitting: Oncology

## 2014-02-02 ENCOUNTER — Other Ambulatory Visit: Payer: Self-pay | Admitting: Oncology

## 2014-02-02 DIAGNOSIS — C50419 Malignant neoplasm of upper-outer quadrant of unspecified female breast: Secondary | ICD-10-CM

## 2014-02-02 DIAGNOSIS — C50911 Malignant neoplasm of unspecified site of right female breast: Secondary | ICD-10-CM

## 2014-02-02 LAB — COMPREHENSIVE METABOLIC PANEL (CC13)
ALT: 18 U/L (ref 0–55)
AST: 26 U/L (ref 5–34)
Albumin: 4 g/dL (ref 3.5–5.0)
Alkaline Phosphatase: 99 U/L (ref 40–150)
Anion Gap: 10 mEq/L (ref 3–11)
BUN: 18.6 mg/dL (ref 7.0–26.0)
CALCIUM: 9.4 mg/dL (ref 8.4–10.4)
CHLORIDE: 105 meq/L (ref 98–109)
CO2: 26 mEq/L (ref 22–29)
Creatinine: 0.9 mg/dL (ref 0.6–1.1)
Glucose: 78 mg/dl (ref 70–140)
Potassium: 4 mEq/L (ref 3.5–5.1)
Sodium: 141 mEq/L (ref 136–145)
Total Bilirubin: 0.37 mg/dL (ref 0.20–1.20)
Total Protein: 7.1 g/dL (ref 6.4–8.3)

## 2014-02-02 LAB — CBC WITH DIFFERENTIAL/PLATELET
BASO%: 0.5 % (ref 0.0–2.0)
Basophils Absolute: 0 10*3/uL (ref 0.0–0.1)
EOS%: 4.5 % (ref 0.0–7.0)
Eosinophils Absolute: 0.4 10*3/uL (ref 0.0–0.5)
HEMATOCRIT: 37.1 % (ref 34.8–46.6)
HEMOGLOBIN: 12.2 g/dL (ref 11.6–15.9)
LYMPH%: 12.8 % — ABNORMAL LOW (ref 14.0–49.7)
MCH: 34.2 pg — AB (ref 25.1–34.0)
MCHC: 32.8 g/dL (ref 31.5–36.0)
MCV: 104.1 fL — AB (ref 79.5–101.0)
MONO#: 0.6 10*3/uL (ref 0.1–0.9)
MONO%: 7.2 % (ref 0.0–14.0)
NEUT#: 6.6 10*3/uL — ABNORMAL HIGH (ref 1.5–6.5)
NEUT%: 75 % (ref 38.4–76.8)
Platelets: 203 10*3/uL (ref 145–400)
RBC: 3.56 10*6/uL — ABNORMAL LOW (ref 3.70–5.45)
RDW: 17.7 % — ABNORMAL HIGH (ref 11.2–14.5)
WBC: 8.8 10*3/uL (ref 3.9–10.3)
lymph#: 1.1 10*3/uL (ref 0.9–3.3)

## 2014-02-05 ENCOUNTER — Other Ambulatory Visit (INDEPENDENT_AMBULATORY_CARE_PROVIDER_SITE_OTHER): Payer: Self-pay | Admitting: General Surgery

## 2014-02-05 ENCOUNTER — Other Ambulatory Visit: Payer: Self-pay | Admitting: Oncology

## 2014-02-11 ENCOUNTER — Telehealth: Payer: Self-pay | Admitting: Emergency Medicine

## 2014-02-11 ENCOUNTER — Other Ambulatory Visit: Payer: Self-pay | Admitting: Emergency Medicine

## 2014-02-11 ENCOUNTER — Encounter: Payer: Self-pay | Admitting: Oncology

## 2014-02-11 ENCOUNTER — Encounter (HOSPITAL_COMMUNITY): Payer: Self-pay

## 2014-02-11 ENCOUNTER — Other Ambulatory Visit: Payer: Self-pay | Admitting: Oncology

## 2014-02-11 NOTE — Progress Notes (Signed)
RACHAEL FROM BIOLOGICS CALLED AND WANTED TO KNOW WHETHER OR NOT TO SHIP XELODA.  PER DR [place] WILL RESUME HER XELODA ON THE 20TH OF October. CALLED RACHAEL [phone] AND SHE WILL CALL THE PATIENT CLOSER TO THAT TIME.

## 2014-02-11 NOTE — Telephone Encounter (Signed)
Patient called stating that she will be having surgery on 10/6.   Patient states that she is to restart the Xeloda on 10/5.  Patient asking if she should continue the Xeloda before and during the time of surgery or hold it until after surgery.  Will notify patient with further instructions per Dr [name].

## 2014-02-12 ENCOUNTER — Encounter: Payer: Self-pay | Admitting: Oncology

## 2014-02-12 ENCOUNTER — Telehealth: Payer: Self-pay | Admitting: Oncology

## 2014-02-12 NOTE — Telephone Encounter (Signed)
LVM for pt regarding to cx appt and new appt d.t....mailed pt appt sched and letter

## 2014-02-13 ENCOUNTER — Encounter (HOSPITAL_COMMUNITY)
Admission: RE | Admit: 2014-02-13 | Discharge: 2014-02-13 | Disposition: A | Discharge status: Home / Self Care | Payer: BC Managed Care – PPO | Source: Ambulatory Visit | Attending: General Surgery | Admitting: General Surgery

## 2014-02-13 ENCOUNTER — Other Ambulatory Visit (HOSPITAL_COMMUNITY): Payer: Self-pay | Admitting: *Deleted

## 2014-02-13 ENCOUNTER — Encounter (HOSPITAL_COMMUNITY): Payer: Self-pay

## 2014-02-13 DIAGNOSIS — Z01812 Encounter for preprocedural laboratory examination: Secondary | ICD-10-CM | POA: Insufficient documentation

## 2014-02-13 DIAGNOSIS — C50911 Malignant neoplasm of unspecified site of right female breast: Secondary | ICD-10-CM | POA: Insufficient documentation

## 2014-02-13 LAB — CBC
HEMATOCRIT: 34.5 % — AB (ref 36.0–46.0)
HEMOGLOBIN: 11.8 g/dL — AB (ref 12.0–15.0)
MCH: 34.4 pg — AB (ref 26.0–34.0)
MCHC: 34.2 g/dL (ref 30.0–36.0)
MCV: 100.6 fL — ABNORMAL HIGH (ref 78.0–100.0)
Platelets: 205 10*3/uL (ref 150–400)
RBC: 3.43 MIL/uL — AB (ref 3.87–5.11)
RDW: 15.5 % (ref 11.5–15.5)
WBC: 7.3 10*3/uL (ref 4.0–10.5)

## 2014-02-13 LAB — BASIC METABOLIC PANEL
Anion gap: 11 (ref 5–15)
BUN: 16 mg/dL (ref 6–23)
CHLORIDE: 99 meq/L (ref 96–112)
CO2: 28 mEq/L (ref 19–32)
Calcium: 9 mg/dL (ref 8.4–10.5)
Creatinine, Ser: 0.82 mg/dL (ref 0.50–1.10)
GFR calc non Af Amer: 76 mL/min — ABNORMAL LOW (ref >90)
GFR, EST AFRICAN AMERICAN: 88 mL/min — AB (ref >90)
Glucose, Bld: 91 mg/dL (ref 70–99)
POTASSIUM: 4.3 meq/L (ref 3.7–5.3)
Sodium: 138 mEq/L (ref 137–147)

## 2014-02-13 NOTE — Pre-Procedure Instructions (Addendum)
Carrie Berry  02/13/2014   Your procedure is scheduled on:  Tuesday, February 17, 2014    Report to [facility] Entrance "A" Admitting Office at 11:00 AM.   Call this number if you have problems the morning of surgery: [phone]   Remember:   Do not eat food or drink liquids after midnight Monday, 02/16/14.   Take these medicines the morning of surgery with A SIP OF WATER: omeprazole,eye drops, pain med if needed    STOP all herbel meds, nsaids (aleve,naproxen,advil,ibuprofen) 5 days prior to surgery including aspirin, vitamins,excedrin   Do not wear jewelry, make-up or nail polish.  Do not wear lotions, powders, or perfumes. You may NOT wear deodorant.  Do not shave 48 hours prior to surgery.   Do not bring valuables to the hospital.  [facility] is not responsible                  for any belongings or valuables.               Contacts, dentures or bridgework may not be worn into surgery.  Leave suitcase in the car. After surgery it may be brought to your room.  For patients admitted to the hospital, discharge time is determined by your                treatment team.              Special Instructions: [place] - Preparing for Surgery  Before surgery, you can play an important role.  Because skin is not sterile, your skin needs to be as free of germs as possible.  You can reduce the number of germs on you skin by washing with CHG (chlorahexidine gluconate) soap before surgery.  CHG is an antiseptic cleaner which kills germs and bonds with the skin to continue killing germs even after washing.  Please DO NOT use if you have an allergy to CHG or antibacterial soaps.  If your skin becomes reddened/irritated stop using the CHG and inform your nurse when you arrive at Short Stay.  Do not shave (including legs and underarms) for at least 48 hours prior to the first CHG shower.  You may shave your face.  Please follow these instructions carefully:   1.  Shower with CHG Soap  the night before surgery and the                                morning of Surgery.  2.  If you choose to wash your hair, wash your hair first as usual with your       normal shampoo.  3.  After you shampoo, rinse your hair and body thoroughly to remove the                      Shampoo.  4.  Use CHG as you would any other liquid soap.  You can apply chg directly       to the skin and wash gently with scrungie or a clean washcloth.  5.  Apply the CHG Soap to your body ONLY FROM THE NECK DOWN.        Do not use on open wounds or open sores.  Avoid contact with your eyes, ears, mouth and genitals (private parts).  Wash genitals (private parts) with your normal soap.  6.  Wash thoroughly, paying special attention to  the area where your surgery        will be performed.  7.  Thoroughly rinse your body with warm water from the neck down.  8.  DO NOT shower/wash with your normal soap after using and rinsing off       the CHG Soap.  9.  Pat yourself dry with a clean towel.            10.  Wear clean pajamas.            11.  Place clean sheets on your bed the night of your first shower and do not        sleep with pets.  Day of Surgery  Do not apply any lotions/deodorants the morning of surgery.  Please wear clean clothes to the hospital/surgery center.     Please read over the following fact sheets that you were given: Pain Booklet, Coughing and Deep Breathing and Surgical Site Infection Prevention

## 2014-02-16 ENCOUNTER — Ambulatory Visit: Payer: BC Managed Care – PPO | Admitting: Nurse Practitioner

## 2014-02-16 ENCOUNTER — Other Ambulatory Visit: Payer: BC Managed Care – PPO

## 2014-02-16 ENCOUNTER — Other Ambulatory Visit: Payer: Self-pay | Admitting: *Deleted

## 2014-02-16 DIAGNOSIS — C799 Secondary malignant neoplasm of unspecified site: Secondary | ICD-10-CM

## 2014-02-16 MED ORDER — OMEPRAZOLE 20 MG PO CPDR: 20.0000 mg | DELAYED_RELEASE_CAPSULE | Freq: Two times a day (BID) | ORAL | Status: DC

## 2014-02-16 MED ORDER — CEFAZOLIN SODIUM-DEXTROSE 2-3 GM-% IV SOLR
2.0000 g | INTRAVENOUS | Status: AC
  Administered 2014-02-17: 2 g via INTRAVENOUS
  Filled 2014-02-16: qty 50

## 2014-02-17 ENCOUNTER — Encounter (HOSPITAL_COMMUNITY): Payer: BC Managed Care – PPO | Admitting: Anesthesiology

## 2014-02-17 ENCOUNTER — Encounter (HOSPITAL_COMMUNITY)
Admission: RE | Disposition: A | Discharge status: Home / Self Care | Payer: Self-pay | Source: Ambulatory Visit | Attending: General Surgery

## 2014-02-17 ENCOUNTER — Observation Stay (HOSPITAL_COMMUNITY)
Admission: RE | Admit: 2014-02-17 | Discharge: 2014-02-18 | Disposition: A | Discharge status: Home / Self Care | Payer: BC Managed Care – PPO | Source: Ambulatory Visit | Attending: General Surgery | Admitting: General Surgery

## 2014-02-17 ENCOUNTER — Encounter (HOSPITAL_COMMUNITY): Payer: Self-pay | Admitting: Anesthesiology

## 2014-02-17 ENCOUNTER — Ambulatory Visit (HOSPITAL_COMMUNITY): Payer: BC Managed Care – PPO | Admitting: Anesthesiology

## 2014-02-17 DIAGNOSIS — C7800 Secondary malignant neoplasm of unspecified lung: Secondary | ICD-10-CM | POA: Insufficient documentation

## 2014-02-17 DIAGNOSIS — Z79899 Other long term (current) drug therapy: Secondary | ICD-10-CM | POA: Insufficient documentation

## 2014-02-17 DIAGNOSIS — T451X5A Adverse effect of antineoplastic and immunosuppressive drugs, initial encounter: Secondary | ICD-10-CM | POA: Insufficient documentation

## 2014-02-17 DIAGNOSIS — Z7951 Long term (current) use of inhaled steroids: Secondary | ICD-10-CM | POA: Diagnosis not present

## 2014-02-17 DIAGNOSIS — C787 Secondary malignant neoplasm of liver and intrahepatic bile duct: Secondary | ICD-10-CM | POA: Insufficient documentation

## 2014-02-17 DIAGNOSIS — C50911 Malignant neoplasm of unspecified site of right female breast: Secondary | ICD-10-CM | POA: Diagnosis present

## 2014-02-17 DIAGNOSIS — C50919 Malignant neoplasm of unspecified site of unspecified female breast: Secondary | ICD-10-CM | POA: Diagnosis present

## 2014-02-17 DIAGNOSIS — C50511 Malignant neoplasm of lower-outer quadrant of right female breast: Principal | ICD-10-CM | POA: Insufficient documentation

## 2014-02-17 DIAGNOSIS — K219 Gastro-esophageal reflux disease without esophagitis: Secondary | ICD-10-CM | POA: Diagnosis not present

## 2014-02-17 DIAGNOSIS — G620 Drug-induced polyneuropathy: Secondary | ICD-10-CM | POA: Insufficient documentation

## 2014-02-17 DIAGNOSIS — C50411 Malignant neoplasm of upper-outer quadrant of right female breast: Secondary | ICD-10-CM | POA: Diagnosis present

## 2014-02-17 DIAGNOSIS — C773 Secondary and unspecified malignant neoplasm of axilla and upper limb lymph nodes: Secondary | ICD-10-CM | POA: Insufficient documentation

## 2014-02-17 HISTORY — PX: TOTAL MASTECTOMY: SHX6129

## 2014-02-17 SURGERY — MASTECTOMY, SIMPLE
Anesthesia: General | Site: Breast | Laterality: Right

## 2014-02-17 MED ORDER — DEXAMETHASONE SODIUM PHOSPHATE 4 MG/ML IJ SOLN
INTRAMUSCULAR | Status: AC
  Filled 2014-02-17: qty 1

## 2014-02-17 MED ORDER — LIDOCAINE HCL (CARDIAC) 20 MG/ML IV SOLN
INTRAVENOUS | Status: AC
  Filled 2014-02-17: qty 10

## 2014-02-17 MED ORDER — ONDANSETRON HCL 4 MG/2ML IJ SOLN
INTRAMUSCULAR | Status: DC | PRN
  Administered 2014-02-17: 4 mg via INTRAVENOUS

## 2014-02-17 MED ORDER — FENTANYL CITRATE 0.05 MG/ML IJ SOLN
INTRAMUSCULAR | Status: AC
  Filled 2014-02-17: qty 5

## 2014-02-17 MED ORDER — TRAZODONE HCL 100 MG PO TABS
100.0000 mg | ORAL_TABLET | Freq: Every day | ORAL | Status: DC
  Administered 2014-02-17: 100 mg via ORAL
  Filled 2014-02-17 (×2): qty 1

## 2014-02-17 MED ORDER — GLYCOPYRROLATE 0.2 MG/ML IJ SOLN
INTRAMUSCULAR | Status: AC
  Filled 2014-02-17: qty 3

## 2014-02-17 MED ORDER — ARTIFICIAL TEARS OP OINT
TOPICAL_OINTMENT | OPHTHALMIC | Status: DC | PRN
  Administered 2014-02-17: 1 via OPHTHALMIC

## 2014-02-17 MED ORDER — ROCURONIUM BROMIDE 50 MG/5ML IV SOLN
INTRAVENOUS | Status: AC
  Filled 2014-02-17: qty 1

## 2014-02-17 MED ORDER — GLYCOPYRROLATE 0.2 MG/ML IJ SOLN
INTRAMUSCULAR | Status: DC | PRN
  Administered 2014-02-17: 0.4 mg via INTRAVENOUS

## 2014-02-17 MED ORDER — SCOPOLAMINE 1 MG/3DAYS TD PT72
1.0000 | MEDICATED_PATCH | TRANSDERMAL | Status: DC
  Administered 2014-02-17: 1.5 mg via TRANSDERMAL

## 2014-02-17 MED ORDER — ONDANSETRON HCL 4 MG/2ML IJ SOLN
INTRAMUSCULAR | Status: AC
  Filled 2014-02-17: qty 2

## 2014-02-17 MED ORDER — MIDAZOLAM HCL 2 MG/2ML IJ SOLN
INTRAMUSCULAR | Status: AC
  Filled 2014-02-17: qty 2

## 2014-02-17 MED ORDER — FENTANYL CITRATE 0.05 MG/ML IJ SOLN
25.0000 ug | INTRAMUSCULAR | Status: DC | PRN
  Administered 2014-02-17 (×2): 25 ug via INTRAVENOUS

## 2014-02-17 MED ORDER — PROPOFOL 10 MG/ML IV BOLUS
INTRAVENOUS | Status: DC | PRN
  Administered 2014-02-17: 150 mg via INTRAVENOUS

## 2014-02-17 MED ORDER — KCL-LACTATED RINGERS-D5W 20 MEQ/L IV SOLN
INTRAVENOUS | Status: DC
  Administered 2014-02-17: 17:00:00 via INTRAVENOUS
  Filled 2014-02-17 (×3): qty 1000

## 2014-02-17 MED ORDER — NEOSTIGMINE METHYLSULFATE 10 MG/10ML IV SOLN
INTRAVENOUS | Status: AC
  Filled 2014-02-17: qty 1

## 2014-02-17 MED ORDER — FENTANYL CITRATE 0.05 MG/ML IJ SOLN
INTRAMUSCULAR | Status: DC | PRN
  Administered 2014-02-17 (×2): 50 ug via INTRAVENOUS
  Administered 2014-02-17: 100 ug via INTRAVENOUS
  Administered 2014-02-17: 50 ug via INTRAVENOUS

## 2014-02-17 MED ORDER — ARTIFICIAL TEARS OP OINT
TOPICAL_OINTMENT | OPHTHALMIC | Status: AC
  Filled 2014-02-17: qty 3.5

## 2014-02-17 MED ORDER — ROCURONIUM BROMIDE 100 MG/10ML IV SOLN
INTRAVENOUS | Status: DC | PRN
Start: 2014-02-17 — End: 2014-02-17
  Administered 2014-02-17: 40 mg via INTRAVENOUS

## 2014-02-17 MED ORDER — SCOPOLAMINE 1 MG/3DAYS TD PT72
MEDICATED_PATCH | TRANSDERMAL | Status: AC
Start: 2014-02-17 — End: 2014-02-17
  Filled 2014-02-17: qty 1

## 2014-02-17 MED ORDER — CEFAZOLIN SODIUM 1-5 GM-% IV SOLN
1.0000 g | Freq: Four times a day (QID) | INTRAVENOUS | Status: DC
  Administered 2014-02-17 – 2014-02-18 (×2): 1 g via INTRAVENOUS
  Filled 2014-02-17 (×4): qty 50

## 2014-02-17 MED ORDER — FENTANYL CITRATE 0.05 MG/ML IJ SOLN
INTRAMUSCULAR | Status: AC
  Filled 2014-02-17: qty 2

## 2014-02-17 MED ORDER — 0.9 % SODIUM CHLORIDE (POUR BTL) OPTIME
TOPICAL | Status: DC | PRN
  Administered 2014-02-17: 1000 mL

## 2014-02-17 MED ORDER — PROPOFOL 10 MG/ML IV BOLUS
INTRAVENOUS | Status: AC
  Filled 2014-02-17: qty 20

## 2014-02-17 MED ORDER — PROMETHAZINE HCL 25 MG/ML IJ SOLN: 6.2500 mg | INTRAMUSCULAR | Status: DC | PRN

## 2014-02-17 MED ORDER — NEOSTIGMINE METHYLSULFATE 10 MG/10ML IV SOLN
INTRAVENOUS | Status: DC | PRN
  Administered 2014-02-17: 3 mg via INTRAVENOUS

## 2014-02-17 MED ORDER — ASPIRIN-ACETAMINOPHEN-CAFFEINE 250-250-65 MG PO TABS
1.0000 | ORAL_TABLET | Freq: Four times a day (QID) | ORAL | Status: DC | PRN
  Administered 2014-02-18: 1 via ORAL
  Filled 2014-02-17: qty 1

## 2014-02-17 MED ORDER — TOBRAMYCIN-DEXAMETHASONE 0.3-0.1 % OP SUSP
1.0000 [drp] | Freq: Every day | OPHTHALMIC | Status: DC
  Administered 2014-02-17 – 2014-02-18 (×2): 1 [drp] via OPHTHALMIC
  Filled 2014-02-17: qty 2.5

## 2014-02-17 MED ORDER — LACTATED RINGERS IV SOLN
INTRAVENOUS | Status: DC
  Administered 2014-02-17: 12:00:00 via INTRAVENOUS

## 2014-02-17 MED ORDER — ACETAMINOPHEN 10 MG/ML IV SOLN
INTRAVENOUS | Status: AC
  Filled 2014-02-17: qty 100

## 2014-02-17 MED ORDER — MIDAZOLAM HCL 5 MG/5ML IJ SOLN
INTRAMUSCULAR | Status: DC | PRN
  Administered 2014-02-17 (×2): 1 mg via INTRAVENOUS

## 2014-02-17 MED ORDER — PANTOPRAZOLE SODIUM 40 MG PO TBEC
40.0000 mg | DELAYED_RELEASE_TABLET | Freq: Every day | ORAL | Status: DC
  Administered 2014-02-17 – 2014-02-18 (×2): 40 mg via ORAL
  Filled 2014-02-17 (×2): qty 1

## 2014-02-17 MED ORDER — MORPHINE SULFATE 2 MG/ML IJ SOLN
2.0000 mg | INTRAMUSCULAR | Status: DC | PRN
  Administered 2014-02-17 – 2014-02-18 (×3): 4 mg via INTRAVENOUS
  Filled 2014-02-17 (×2): qty 2
  Filled 2014-02-17: qty 1
  Filled 2014-02-17 (×2): qty 2

## 2014-02-17 MED ORDER — PROMETHAZINE HCL 25 MG/ML IJ SOLN
12.5000 mg | INTRAMUSCULAR | Status: DC | PRN
  Administered 2014-02-18: 12.5 mg via INTRAVENOUS
  Filled 2014-02-17: qty 1

## 2014-02-17 MED ORDER — ACETAMINOPHEN 10 MG/ML IV SOLN
1000.0000 mg | Freq: Once | INTRAVENOUS | Status: AC
  Administered 2014-02-17: 1000 mg via INTRAVENOUS

## 2014-02-17 MED ORDER — TRAMADOL HCL 50 MG PO TABS
100.0000 mg | ORAL_TABLET | Freq: Four times a day (QID) | ORAL | Status: DC | PRN
  Administered 2014-02-18: 100 mg via ORAL
  Filled 2014-02-17: qty 2

## 2014-02-17 MED ORDER — PHENYLEPHRINE 40 MCG/ML (10ML) SYRINGE FOR IV PUSH (FOR BLOOD PRESSURE SUPPORT)
PREFILLED_SYRINGE | INTRAVENOUS | Status: AC
  Filled 2014-02-17: qty 10

## 2014-02-17 MED ORDER — MEPERIDINE HCL 25 MG/ML IJ SOLN: 6.2500 mg | INTRAMUSCULAR | Status: DC | PRN

## 2014-02-17 MED ORDER — OXYCODONE HCL 5 MG PO TABS: 5.0000 mg | ORAL_TABLET | ORAL | Status: DC | PRN

## 2014-02-17 MED ORDER — DEXAMETHASONE SODIUM PHOSPHATE 4 MG/ML IJ SOLN
INTRAMUSCULAR | Status: DC | PRN
  Administered 2014-02-17: 8 mg via INTRAVENOUS

## 2014-02-17 MED ORDER — LACTATED RINGERS IV SOLN
INTRAVENOUS | Status: DC | PRN
  Administered 2014-02-17 (×2): via INTRAVENOUS

## 2014-02-17 MED ORDER — LIDOCAINE HCL (CARDIAC) 20 MG/ML IV SOLN
INTRAVENOUS | Status: DC | PRN
  Administered 2014-02-17: 60 mg via INTRAVENOUS

## 2014-02-17 SURGICAL SUPPLY — 46 items
ADH SKN CLS APL DERMABOND .7 (GAUZE/BANDAGES/DRESSINGS) ×1
BINDER BREAST LRG (GAUZE/BANDAGES/DRESSINGS) ×3 IMPLANT
BINDER BREAST XLRG (GAUZE/BANDAGES/DRESSINGS) IMPLANT
BIOPATCH RED 1 DISK 7.0 (GAUZE/BANDAGES/DRESSINGS) ×2 IMPLANT
BIOPATCH RED 1IN DISK 7.0MM (GAUZE/BANDAGES/DRESSINGS) ×1
CHLORAPREP W/TINT 26ML (MISCELLANEOUS) ×3 IMPLANT
COVER SURGICAL LIGHT HANDLE (MISCELLANEOUS) ×3 IMPLANT
DERMABOND ADVANCED (GAUZE/BANDAGES/DRESSINGS) ×2
DERMABOND ADVANCED .7 DNX12 (GAUZE/BANDAGES/DRESSINGS) ×1 IMPLANT
DRAIN CHANNEL 19F RND (DRAIN) ×3 IMPLANT
DRAPE CHEST BREAST 15X10 FENES (DRAPES) ×3 IMPLANT
DRAPE ORTHO SPLIT 77X108 STRL (DRAPES)
DRAPE SURG ORHT 6 SPLT 77X108 (DRAPES) IMPLANT
DRAPE UTILITY 15X26 W/TAPE STR (DRAPE) ×6 IMPLANT
DRSG ADAPTIC 3X8 NADH LF (GAUZE/BANDAGES/DRESSINGS) ×2 IMPLANT
DRSG PAD ABDOMINAL 8X10 ST (GAUZE/BANDAGES/DRESSINGS) ×4 IMPLANT
ELECT REM PT RETURN 9FT ADLT (ELECTROSURGICAL) ×3
ELECTRODE REM PT RTRN 9FT ADLT (ELECTROSURGICAL) ×1 IMPLANT
EVACUATOR SILICONE 100CC (DRAIN) ×3 IMPLANT
GAUZE SPONGE 4X4 12PLY STRL (GAUZE/BANDAGES/DRESSINGS) ×3 IMPLANT
GLOVE BIO SURGEON STRL SZ7.5 (GLOVE) ×2 IMPLANT
GLOVE BIOGEL PI IND STRL 6.5 (GLOVE) IMPLANT
GLOVE BIOGEL PI IND STRL 7.5 (GLOVE) IMPLANT
GLOVE BIOGEL PI IND STRL 8 (GLOVE) ×1 IMPLANT
GLOVE BIOGEL PI INDICATOR 6.5 (GLOVE) ×2
GLOVE BIOGEL PI INDICATOR 7.5 (GLOVE) ×2
GLOVE BIOGEL PI INDICATOR 8 (GLOVE) ×2
GLOVE ECLIPSE 8.0 STRL XLNG CF (GLOVE) ×3 IMPLANT
GLOVE SURG SS PI 6.5 STRL IVOR (GLOVE) ×4 IMPLANT
GLOVE SURG SS PI 7.0 STRL IVOR (GLOVE) ×4 IMPLANT
GOWN STRL REUS W/ TWL LRG LVL3 (GOWN DISPOSABLE) ×3 IMPLANT
GOWN STRL REUS W/TWL LRG LVL3 (GOWN DISPOSABLE) ×15
KIT BASIN OR (CUSTOM PROCEDURE TRAY) ×3 IMPLANT
KIT ROOM TURNOVER OR (KITS) ×3 IMPLANT
NS IRRIG 1000ML POUR BTL (IV SOLUTION) ×3 IMPLANT
PACK GENERAL/GYN (CUSTOM PROCEDURE TRAY) ×3 IMPLANT
PAD ARMBOARD 7.5X6 YLW CONV (MISCELLANEOUS) ×3 IMPLANT
SPONGE GAUZE 4X4 12PLY STER LF (GAUZE/BANDAGES/DRESSINGS) ×4 IMPLANT
STAPLER SKIN PROX WIDE 3.9 (STAPLE) ×2 IMPLANT
SUT ETHILON 2 0 FS 18 (SUTURE) ×3 IMPLANT
SUT MON AB 4-0 PC3 18 (SUTURE) ×3 IMPLANT
SUT VIC AB 3-0 SH 18 (SUTURE) ×7 IMPLANT
TAPE CLOTH SURG 4X10 WHT LF (GAUZE/BANDAGES/DRESSINGS) ×2 IMPLANT
TAPE CLOTH SURG 6X10 WHT LF (GAUZE/BANDAGES/DRESSINGS) ×2 IMPLANT
TOWEL OR 17X24 6PK STRL BLUE (TOWEL DISPOSABLE) ×3 IMPLANT
TOWEL OR 17X26 10 PK STRL BLUE (TOWEL DISPOSABLE) ×3 IMPLANT

## 2014-02-17 NOTE — H&P (Signed)
Carrie Berry 02/05/2014 4:03 PM Location: [facility] Patient #: [id number] DOB: [birth year] Married / Language: Carrie Berry / Race: White Female  History of Present Illness Carrie Hollingshead MD; 02/05/2014 6:03 PM) Patient words: breast.  The patient is a 63 year old female who presents with breast cancer.  Note:She has been sent over by Dr. Jana Berry to discuss palliative right mastectomy. She was diagnosed with invasive right breast cancer in August of 2014. It was metastatic to her right axillary lymph nodes, her liver and her lungs. She has undergone chemotherapy and the metastatic disease is controlled. However, the tumor has grown through her skin and is fungating. It does cause some pain. She is back at work. 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   Other Problems Carrie Berry Carrie Berry, [place]; 02/05/2014 4:56 PM) Breast Cancer Lung Cancer  Past Surgical History Carrie Berry Carrie Berry, [place]; 02/05/2014 4:56 PM) No pertinent past surgical history  Diagnostic Studies History (Carrie Berry, [place]; 02/05/2014 4:56 PM) Colonoscopy 5-10 years ago Mammogram within last year Pap Smear >5 years ago  Allergies Carrie Berry Carrie Berry, [place]; 02/05/2014 4:56 PM) Codeine/Codeine Derivatives Ondansetron *ANTIEMETICS* Penicillins  Medication History ([place], RMA; 02/05/2014 4:56 PM) Acetaminophen (325MG  Tablet, Oral) Active. Excedrin Migraine (250-250-65MG  Tablet, Oral) Active. Xeloda (500MG  Tablet, Oral) Active. Valium (5MG  Tablet, Oral) Active. EMLA (2.5-2.5% Cream, External) Active. Ativan (0.5MG  Tablet, Oral) Active. PriLOSEC (20MG  Capsule DR, Oral) Active. Zofran (8MG  Tablet, Oral) Active. Pataday (0.2% Solution, Ophthalmic) Active. Compazine (10MG  Tablet, Oral) Active. Phenergan (25MG  Tablet, Oral) Active. Tobradex (Ophthalmic) Active. TraMADol HCl (50MG  Tablet, Oral) Active. TraZODone HCl (50MG   Tablet, Oral) Active. Nasacort (55MCG/ACT Aerosol Soln, Nasal) Active. Valtrex (1GM Tablet, Oral) Active. Ambien (5MG  Tablet, Oral) Active. Medications Reconciled  Social History Carrie Berry Carrie Berry, [place]; 02/05/2014 4:56 PM) Alcohol use Occasional alcohol use. No caffeine use No drug use Tobacco use Never smoker.  Family History Carrie Berry Carrie Berry, [place]; 02/05/2014 4:56 PM) Arthritis Father, Mother. Cancer Father. Respiratory Condition Father.  Pregnancy / Birth History Carrie Berry [place], [place]; 02/05/2014 4:56 PM) Age at menarche 89 years. Age of menopause 51-55 Contraceptive History Oral contraceptives. Gravida 2 Maternal age 42-30 Para 2  Review of Systems (Carrie Berry RMA; 02/05/2014 4:56 PM) General Not Present- Appetite Loss, Chills, Fatigue, Fever, Night Sweats, Weight Gain and Weight Loss. Skin Present- Dryness and New Lesions. Not Present- Change in Wart/Mole, Hives, Jaundice, Non-Healing Wounds, Rash and Ulcer. HEENT Present- Wears glasses/contact lenses. Not Present- Earache, Hearing Loss, Hoarseness, Nose Bleed, Oral Ulcers, Ringing in the Ears, Seasonal Allergies, Sinus Pain, Sore Throat, Visual Disturbances and Yellow Eyes. Respiratory Not Present- Bloody sputum, Chronic Cough, Difficulty Breathing, Snoring and Wheezing. Breast Present- Breast Mass, Breast Pain and Skin Changes. Not Present- Nipple Discharge. Cardiovascular Not Present- Chest Pain, Difficulty Breathing Lying Down, Leg Cramps, Palpitations, Rapid Heart Rate, Shortness of Breath and Swelling of Extremities. Gastrointestinal Not Present- Abdominal Pain, Bloating, Bloody Stool, Change in Bowel Habits, Chronic diarrhea, Constipation, Difficulty Swallowing, Excessive gas, Gets full quickly at meals, Hemorrhoids, Indigestion, Nausea, Rectal Pain and Vomiting. Female Genitourinary Not Present- Frequency, Nocturia, Painful Urination, Pelvic Pain and Urgency. Musculoskeletal Not  Present- Back Pain, Joint Pain, Joint Stiffness, Muscle Pain, Muscle Weakness and Swelling of Extremities. Neurological Not Present- Decreased Memory, Fainting, Headaches, Numbness, Seizures, Tingling, Tremor, Trouble walking and Weakness. Psychiatric Not Present- Anxiety, Bipolar, Change in Sleep Pattern, Depression, Fearful and Frequent crying. Endocrine Not Present- Cold Intolerance, Excessive Hunger, Hair Changes, Heat Intolerance, Hot flashes and New Diabetes. Hematology Not Present- Easy Bruising,  Excessive bleeding, Gland problems, HIV and Persistent Infections.   Vitals (Carrie Berry RMA; 02/05/2014 4:58 PM) 02/05/2014 4:56 PM Weight: 121 lb Height: 63.5in Body Surface Area: 1.57 m Body Mass Index: 21.1 kg/m Temp.: 98.12F(Oral)  Pulse: 78 (Regular)  BP: 110/66 (Sitting, Left Arm, Standard)    Physical Exam Carrie Hollingshead MD; 02/05/2014 6:04 PM) The physical exam findings are as follows: Note:General: WDWN in NAD. Pleasant and cooperative.  HEENT: [place]/AT, no facial masses  EYES: EOMI, no icterus  NECK: Supple, no obvious mass or thyroid enlargement, palpable catheter in right neck  CV: RRR, no murmur, no JVD.  CHEST: Breath sounds equal and clear. Respirations nonlabored.  BREASTS: 7 cm, fixed, fungating mass lower outer quadrant right breast. No left breast masses.  ABDOMEN: Soft, nontender, nondistended, no masses, no organomegaly.  MUSCULOSKELETAL: FROM, good muscle tone, no edema, no venous stasis changes  SKIN: No jaundice or suspicious rashes.  NEUROLOGIC: Alert and oriented, answers questions appropriately, normal gait and station.  PSYCHIATRIC: Normal mood, affect , and behavior.    Assessment & Plan Carrie Hollingshead MD; 02/05/2014 6:08 PM) MALIGNANT NEOPLASM OF LOWER-OUTER QUADRANT OF RIGHT FEMALE BREAST (174.5  C50.511) Impression: Her metastatic disease is well controlled but her tumor is now fungating.  Plan: Palliative  right mastectomy. Procedure and risks have been discussed with her.Risks include but are not limited to bleeding, infection, wound healing problems, anesthesia, difficulty with wound closure requiringa skin graft or a VAC. She seems to understand this and would like to proceed. Current Plans   Signed by Carrie Hollingshead, MD

## 2014-02-17 NOTE — Op Note (Signed)
Operative Note  Carrie Berry female 63 y.o. 02/17/2014  PREOPERATIVE DX:  Stage IV right breast cancer  POSTOPERATIVE DX:  Same  PROCEDURE:   Palliative right mastectomy         Surgeon: [name]   Assistants: Sharyn Dross RNFA  Anesthesia: General endotracheal anesthesia  Indications:   This is a 63 year old female with stage IV right breast cancer. Her metastasis are fairly well controlled but she has a progressive fungating tumor of the right breast that is causing her a significant amount of discomfort. It is adherent to the chest wall. She now presents for the above procedure.    Procedure Detail:  She was seen in the holding area in the right breast mark with my initials. She was brought to the operating room placed upon the operating table and a general anesthetic was given. The right chest and breast were widely sterilely prepped and draped.  There were some inflammatory changes of the skin involving the fungating tumor in the lower outer quadrant. An elliptical incision was made beginning medially and carrying this around the nipple areolar complex and it laterally at the anterior axillary line. The dermis was divided with electrocautery. Subcutaneous flaps were raised medially to the sternum, superiorly to the clavicle with care taken to keep the right chest wall Port-A-Cath and the subcutaneous tissue, inferiorly to the anterior rectus sheath, and laterally to the latissimus muscle.I then dissected the breast off the chest wall and included some of the pectoralis major muscle where the tumor was adherent to the chest wall. The medial aspect of the breast was marked with a suture. It was handed off the field.  I then inspected the wound and controlled bleeding with interrupted 3-0 Vicryl sutures and electrocautery. The wound was irrigated. Hemostasis was adequate.  I reimplanted the Port-A-Cath onto the chest wall with interrupted 3-0 Vicryl sutures. I mobilized the  inferior flap to allow for skin closure without tension. A stab incision was made in the inferior flap and a size 19 Blake drain was then put into the wound and anchored to the skin with 3-0 nylon suture. The subcutaneous tissues and approximated with 3-0 Vicryl sutures. The skin was closed with staples. Sterile dressings were applied. The drain was hooked to bulb suction.  She tolerated the procedure well without any apparent complications and was taken to recovery in satisfactory condition.  Estimated Blood Loss:  300 mL         Drains:  Blake drain in right chest wall         Specimens: Right breast        Complications:  * No complications entered in OR log *         Disposition: PACU - hemodynamically stable.         Condition: stable

## 2014-02-17 NOTE — Anesthesia Preprocedure Evaluation (Addendum)
Anesthesia Evaluation  Patient identified by MRN, date of birth, ID band Patient awake    Reviewed: Allergy & Precautions, H&P   History of Anesthesia Complications (+) PONV  Airway       Dental   Pulmonary          Cardiovascular  ECHO 2014 while on chemo EF 55%   Neuro/Psych    GI/Hepatic GERD-  Medicated,  Endo/Other    Renal/GU      Musculoskeletal   Abdominal   Peds  Hematology   Anesthesia Other Findings   Reproductive/Obstetrics                           Anesthesia Physical Anesthesia Plan  ASA: II  Anesthesia Plan: General   Post-op Pain Management:    Induction: Intravenous  Airway Management Planned: Oral ETT  Additional Equipment:   Intra-op Plan:   Post-operative Plan: Extubation in OR  Informed Consent: I have reviewed the patients History and Physical, chart, labs and discussed the procedure including the risks, benefits and alternatives for the proposed anesthesia with the patient or authorized representative who has indicated his/her understanding and acceptance.     Plan Discussed with:   Anesthesia Plan Comments:         Anesthesia Quick Evaluation

## 2014-02-17 NOTE — Anesthesia Postprocedure Evaluation (Signed)
  Anesthesia Post-op Note  Patient: Carrie Berry  Procedure(s) Performed: Procedure(s): PALLIATIVE  RIGHT MASTECTOMY (Right)  Patient Location: PACU  Anesthesia Type:General  Level of Consciousness: awake  Airway and Oxygen Therapy: Patient Spontanous Breathing  Post-op Pain: mild  Post-op Assessment: Post-op Vital signs reviewed  Post-op Vital Signs: Reviewed  Last Vitals:  Filed Vitals:   02/17/14 1653  BP: 131/70  Pulse: 71  Temp: 36.7 C  Resp: 14    Complications: No apparent anesthesia complications

## 2014-02-17 NOTE — Anesthesia Procedure Notes (Signed)
Procedure Name: Intubation Date/Time: 02/17/2014 2:08 PM Performed by: Susa Loffler Pre-anesthesia Checklist: Patient identified, Timeout performed, Emergency Drugs available, Suction available and Patient being monitored Patient Re-evaluated:Patient Re-evaluated prior to inductionOxygen Delivery Method: Circle system utilized Preoxygenation: Pre-oxygenation with 100% oxygen Intubation Type: IV induction Ventilation: Mask ventilation without difficulty Laryngoscope Size: Mac and 3 Grade View: Grade I Tube type: Oral Tube size: 7.0 mm Number of attempts: 1 Airway Equipment and Method: Stylet Placement Confirmation: ETT inserted through vocal cords under direct vision,  positive ETCO2 and breath sounds checked- equal and bilateral Secured at: 21 cm Tube secured with: Tape Dental Injury: Teeth and Oropharynx as per pre-operative assessment  Comments: Atraumatic intubation performed by Verneita Griffes

## 2014-02-17 NOTE — Transfer of Care (Signed)
Immediate Anesthesia Transfer of Care Note  Patient: Carrie Berry  Procedure(s) Performed: Procedure(s): PALLIATIVE  RIGHT MASTECTOMY (Right)  Patient Location: PACU  Anesthesia Type:General  Level of Consciousness: awake, alert  and oriented  Airway & Oxygen Therapy: Patient Spontanous Breathing and Patient connected to nasal cannula oxygen  Post-op Assessment: Report given to PACU RN and Post -op Vital signs reviewed and stable  Post vital signs: Reviewed and stable  Complications: No apparent anesthesia complications

## 2014-02-17 NOTE — Interval H&P Note (Signed)
History and Physical Interval Note:  02/17/2014 1:50 PM  Carrie Berry  has presented today for surgery, with the diagnosis of  right breast cancer  The various methods of treatment have been discussed with the patient and family. After consideration of risks, benefits and other options for treatment, the patient has consented to  Procedure(s): RIGHT MASTECTOMY (Right) as a surgical intervention .  The patient's history has been reviewed, patient examined, no change in status, stable for surgery.  I have reviewed the patient's chart and labs.  Questions were answered to the patient's satisfaction.     Madisin Hasan Lenna Sciara

## 2014-02-17 NOTE — Progress Notes (Signed)
Pt a/o, c/o pain, PRN meds given as ordered, pt stable

## 2014-02-18 DIAGNOSIS — C50511 Malignant neoplasm of lower-outer quadrant of right female breast: Secondary | ICD-10-CM | POA: Diagnosis not present

## 2014-02-18 MED ORDER — OXYCODONE HCL 5 MG PO TABS: 5.0000 mg | ORAL_TABLET | Freq: Four times a day (QID) | ORAL | Status: DC | PRN

## 2014-02-18 MED ORDER — ONDANSETRON HCL 4 MG/2ML IJ SOLN
4.0000 mg | Freq: Four times a day (QID) | INTRAMUSCULAR | Status: DC | PRN
Start: 2014-02-18 — End: 2014-02-18

## 2014-02-18 NOTE — Progress Notes (Signed)
0415 Nurse was call to room and patient lying in bed, blood on the floor, bed and patient. Husband stated" I heard her and got up she was squating be side the shower bench and the commode." Husband stated "he assisted her back to bed."Patient had pull out her IV when she got up to go to the bathroom. Patient stated" I remember I needed to go to the bathroom but I was so drowsy".Patient stated "  I don't remember getting up." Patient had gotten morphine and phenergan at [facility]. Patient stated I was drowsy and got off the commode and lost my balance but I was able to squat down. Patient stated" I did not fall." Patient c/o pain to rt breast. Ultram 100mg  given po. Patient more alert but will apply bed alarm for safety needs. Patient and husband agreed until patient is not so drowsy. 0555 Patient resting at present. Will continue to monitor.

## 2014-02-18 NOTE — Progress Notes (Signed)
Discharge instructions and prescription given and explained to pt and husband.  Both verbalize understanding of all orders/instructions and deny any questions.  Both taught how to empty JP drain and demonstrate understanding.  VSS. Iv removed.  Pt discharged to home. Syliva Overman

## 2014-02-18 NOTE — Progress Notes (Signed)
1 Day Post-Op  Subjective: Got confused after having IV Phenergan.  Takes oral Phenergan at home.  Event at 0415 noted.  Sore.  Objective: Vital signs in last 24 hours: Temp:  [97.1 F (36.2 C)-99.2 F (37.3 C)] 99.2 F (37.3 C) (10/07 0456) Pulse Rate:  [71-90] 83 (10/07 0456) Resp:  [9-18] 16 (10/07 0456) BP: (111-142)/(56-82) 142/63 mmHg (10/07 0456) SpO2:  [97 %-100 %] 99 % (10/07 0456) Weight:  [122 lb 9.6 oz (55.611 kg)] 122 lb 9.6 oz (55.611 kg) (10/06 1101) Last BM Date: 02/17/14  Intake/Output from previous day: 10/06 0701 - 10/07 0700 In: 2881.3 [P.O.:540; I.V.:2341.3] Out: 205 [Drains:105; Blood:100] Intake/Output this shift:    PE: General- In NAD Right chest-no significant swelling, thin sanguinous drain output  Lab Results:  No results found for this basename: WBC, HGB, HCT, PLT,  in the last 72 hours BMET No results found for this basename: NA, K, CL, CO2, GLUCOSE, BUN, CREATININE, CALCIUM,  in the last 72 hours PT/INR No results found for this basename: LABPROT, INR,  in the last 72 hours Comprehensive Metabolic Panel:    Component Value Date/Time   NA 138 02/13/2014 1351   NA 141 02/02/2014 1550   NA 139 01/20/2014 1456   NA 144 12/22/2013 0919   K 4.3 02/13/2014 1351   K 4.0 02/02/2014 1550   K 4.3 01/20/2014 1456   K 4.0 12/22/2013 0919   CL 99 02/13/2014 1351   CL 104 12/22/2013 0919   CO2 28 02/13/2014 1351   CO2 26 02/02/2014 1550   CO2 26 01/20/2014 1456   CO2 28 12/22/2013 0919   BUN 16 02/13/2014 1351   BUN 18.6 02/02/2014 1550   BUN 23.7 01/20/2014 1456   BUN 17 12/22/2013 0919   CREATININE 0.82 02/13/2014 1351   CREATININE 0.9 02/02/2014 1550   CREATININE 0.8 01/20/2014 1456   CREATININE 0.86 12/22/2013 0919   GLUCOSE 91 02/13/2014 1351   GLUCOSE 78 02/02/2014 1550   GLUCOSE 96 01/20/2014 1456   GLUCOSE 92 12/22/2013 0919   CALCIUM 9.0 02/13/2014 1351   CALCIUM 9.4 02/02/2014 1550   CALCIUM 9.1 01/20/2014 1456   CALCIUM 9.6 12/22/2013 0919   AST 26 02/02/2014  1550   AST 23 01/20/2014 1456   AST 23 12/22/2013 0919   AST 22 06/24/2013 2245   ALT 18 02/02/2014 1550   ALT 22 01/20/2014 1456   ALT 25 12/22/2013 0919   ALT 28 06/24/2013 2245   ALKPHOS 99 02/02/2014 1550   ALKPHOS 90 01/20/2014 1456   ALKPHOS 109 12/22/2013 0919   ALKPHOS 117 06/24/2013 2245   BILITOT 0.37 02/02/2014 1550   BILITOT 0.48 01/20/2014 1456   BILITOT 0.4 12/22/2013 0919   BILITOT 0.3 06/24/2013 2245   PROT 7.1 02/02/2014 1550   PROT 6.6 01/20/2014 1456   PROT 7.2 12/22/2013 0919   PROT 6.7 06/24/2013 2245   ALBUMIN 4.0 02/02/2014 1550   ALBUMIN 3.7 01/20/2014 1456   ALBUMIN 4.1 12/22/2013 0919   ALBUMIN 3.5 06/24/2013 2245     Studies/Results: No results found.  Anti-infectives: Anti-infectives   Start     Dose/Rate Route Frequency Ordered Stop   02/17/14 2000  ceFAZolin (ANCEF) IVPB 1 g/50 mL premix     1 g 100 mL/hr over 30 Minutes Intravenous Every 6 hours 02/17/14 1708 02/18/14 1359   02/17/14 0600  ceFAZolin (ANCEF) IVPB 2 g/50 mL premix     2 g 100 mL/hr over 30 Minutes Intravenous On  call to O.R. 02/16/14 1519 02/17/14 1415      Assessment Principal Problem:   Breast cancer of right female breast s/p palliative mastectomy 02/17/14-other than the medication induced confusion, she is doing well.    LOS: 1 day   Plan: Will aim to discharge later today.  Follow up next week in the office.   Jaquelin Meaney J 02/18/2014

## 2014-02-18 NOTE — Discharge Instructions (Signed)
[facility] [place] [id number]  MASTECTOMY: POST OP INSTRUCTIONS  Always review your discharge instruction sheet given to you by the facility where your surgery was performed. IF YOU HAVE DISABILITY OR FAMILY LEAVE FORMS, YOU MUST BRING THEM TO THE OFFICE FOR PROCESSING.   DO NOT GIVE THEM TO YOUR DOCTOR. A prescription for pain medication may be given to you upon discharge.  Take your pain medication as prescribed, if needed.  If narcotic pain medicine is not needed, then you may take acetaminophen (Tylenol) or ibuprofen (Advil) as needed. 1. Take your usually prescribed medications unless otherwise directed. 2. If you need a refill on your pain medication, please contact your pharmacy.  They will contact our office to request authorization.  Prescriptions will not be filled after 5pm or on week-ends. 3. You should follow a light diet the first few days after arrival home, such as soup and crackers, etc.  Resume your normal diet the day after surgery. 4. Most patients will experience some swelling and bruising on the chest and underarm.  Ice packs will help.  Swelling and bruising can take several days to resolve.  5. It is common to experience some constipation if taking pain medication after surgery.  Increasing fluid intake and taking a stool softener (such as Colace) will usually help or prevent this problem from occurring.  A mild laxative (Milk of Magnesia or Miralax) should be taken according to package instructions if there are no bowel movements after 48 hours. 6. Unless discharge instructions indicate otherwise, leave your bandage dry and in place until your next appointment next week.  You may take a limited sponge bath.  No tube baths or showers until the drains are removed.  You may have steri-strips (small skin tapes) in place directly over the incision.  These strips should be left on the skin for 7-10 days.  If your surgeon used skin glue on the incision, you may  shower in 24 hours.  The glue will flake off over the next 2-3 weeks.  Any sutures or staples will be removed at the office during your follow-up visit. 7. DRAINS:  If you have drains in place, it is important to keep a list of the amount of drainage produced each day in your drains.  Before leaving the hospital, you should be instructed on drain care.  Call our office if you have any questions about your drains. 8. ACTIVITIES:  You may resume regular (light) daily activities beginning the next day--such as daily self-care, walking, climbing stairs--gradually increasing activities as tolerated.  You may have sexual intercourse when it is comfortable.  Refrain from any heavy lifting or straining until approved by your doctor. a. You may drive when you are no longer taking prescription pain medication, you can comfortably wear a seatbelt, and you can safely maneuver your car and apply brakes. b. RETURN TO WORK:  __________________________________________________________ 9. You should see your doctor in the office for a follow-up appointment approximately 3-5 days after your surgery.  Your doctors nurse will typically make your follow-up appointment when she calls you with your pathology report.  Expect your pathology report 2-3 business days after your surgery.  You may call to check if you do not hear from [place] after three days.   10. OTHER INSTRUCTIONS: _______Empty drain and record output twice a day.  Take Tramadol for pain first.  If it does not work,  Then take the Oxy IR_______________________________________________________________________________________ ____________________________________________________________________________________________ WHEN TO CALL YOUR DOCTOR: 1. Fever over  101.0 2. Nausea and/or vomiting 3. Extreme swelling or bruising 4. Continued bleeding from incision. 5. Increased pain, redness, or drainage from the incision. The clinic staff is available to answer your questions  during regular business hours.  Please dont hesitate to call and ask to speak to one of the nurses for clinical concerns.  If you have a medical emergency, go to the nearest emergency room or call 911.  A surgeon from [facility] is always on call at the hospital. [address] [place], [place], [place]  [address] ? [address], [place], [place]   [address] [phone] ? [phone] ? FAX [phone] Web site: [email or web address]

## 2014-02-19 ENCOUNTER — Encounter (HOSPITAL_COMMUNITY): Payer: Self-pay | Admitting: General Surgery

## 2014-02-23 NOTE — Telephone Encounter (Signed)
NO ENTRY 

## 2014-02-24 ENCOUNTER — Telehealth: Payer: Self-pay | Admitting: *Deleted

## 2014-02-24 MED ORDER — CEPHALEXIN 500 MG PO CAPS: 500.0000 mg | ORAL_CAPSULE | Freq: Two times a day (BID) | ORAL | Status: DC

## 2014-02-24 NOTE — Telephone Encounter (Signed)
Pt called to this RN to state she has a " terrible sinus headache for several days ".  " I can feel it just pounding ".  Per inquiry- Carrie Berry states " I feel like my sinus are packed - like when you tap on her face or head it hurts "  Carrie Berry states she can " feel the sinuses like when you move your head in certain positions"  She states she is nauseous with rising and ambulating- " and I feel very dizzy - almost like I am going to pass out ".  She denies any vomiting but " I have no appetite what so ever ".  She is a febrile  Note Carrie Berry had mastectomy 1 week ago under Dr Zella Richer, " and that is doing great"  Carrie Berry states she has utilized over the counter medications without benefit as well as pain medication oxyir prescribed for post surgical pain.  She called to Dr Bertrum Sol whose office recommended she call her primary MD which she did yesterday and is still awaiting a call.  Note pt's speech was normal in tone, rhythm and wording.  This RN informed pt above would be given to MD for review

## 2014-02-25 ENCOUNTER — Emergency Department (HOSPITAL_COMMUNITY): Payer: BC Managed Care – PPO

## 2014-02-25 ENCOUNTER — Inpatient Hospital Stay (HOSPITAL_COMMUNITY)
Admission: EM | Admit: 2014-02-25 | Discharge: 2014-03-10 | DRG: 054 | Disposition: A | Discharge status: Skilled Nursing Facility | Payer: BC Managed Care – PPO | Attending: Internal Medicine | Admitting: Internal Medicine

## 2014-02-25 ENCOUNTER — Encounter (HOSPITAL_COMMUNITY): Payer: Self-pay | Admitting: Emergency Medicine

## 2014-02-25 DIAGNOSIS — G40109 Localization-related (focal) (partial) symptomatic epilepsy and epileptic syndromes with simple partial seizures, not intractable, without status epilepticus: Secondary | ICD-10-CM

## 2014-02-25 DIAGNOSIS — G936 Cerebral edema: Secondary | ICD-10-CM | POA: Diagnosis present

## 2014-02-25 DIAGNOSIS — C7800 Secondary malignant neoplasm of unspecified lung: Secondary | ICD-10-CM | POA: Diagnosis present

## 2014-02-25 DIAGNOSIS — N649 Disorder of breast, unspecified: Secondary | ICD-10-CM | POA: Diagnosis present

## 2014-02-25 DIAGNOSIS — Z8052 Family history of malignant neoplasm of bladder: Secondary | ICD-10-CM

## 2014-02-25 DIAGNOSIS — M1990 Unspecified osteoarthritis, unspecified site: Secondary | ICD-10-CM | POA: Diagnosis present

## 2014-02-25 DIAGNOSIS — F419 Anxiety disorder, unspecified: Secondary | ICD-10-CM | POA: Diagnosis present

## 2014-02-25 DIAGNOSIS — Z803 Family history of malignant neoplasm of breast: Secondary | ICD-10-CM | POA: Diagnosis not present

## 2014-02-25 DIAGNOSIS — Z9011 Acquired absence of right breast and nipple: Secondary | ICD-10-CM | POA: Diagnosis present

## 2014-02-25 DIAGNOSIS — K219 Gastro-esophageal reflux disease without esophagitis: Secondary | ICD-10-CM

## 2014-02-25 DIAGNOSIS — G4089 Other seizures: Secondary | ICD-10-CM | POA: Diagnosis present

## 2014-02-25 DIAGNOSIS — C787 Secondary malignant neoplasm of liver and intrahepatic bile duct: Secondary | ICD-10-CM | POA: Diagnosis present

## 2014-02-25 DIAGNOSIS — R471 Dysarthria and anarthria: Secondary | ICD-10-CM | POA: Diagnosis present

## 2014-02-25 DIAGNOSIS — Z23 Encounter for immunization: Secondary | ICD-10-CM | POA: Diagnosis not present

## 2014-02-25 DIAGNOSIS — Z800 Family history of malignant neoplasm of digestive organs: Secondary | ICD-10-CM | POA: Diagnosis not present

## 2014-02-25 DIAGNOSIS — Z885 Allergy status to narcotic agent status: Secondary | ICD-10-CM | POA: Diagnosis not present

## 2014-02-25 DIAGNOSIS — G4700 Insomnia, unspecified: Secondary | ICD-10-CM

## 2014-02-25 DIAGNOSIS — C50911 Malignant neoplasm of unspecified site of right female breast: Secondary | ICD-10-CM | POA: Diagnosis present

## 2014-02-25 DIAGNOSIS — Z79899 Other long term (current) drug therapy: Secondary | ICD-10-CM

## 2014-02-25 DIAGNOSIS — K5641 Fecal impaction: Secondary | ICD-10-CM | POA: Diagnosis present

## 2014-02-25 DIAGNOSIS — Z79891 Long term (current) use of opiate analgesic: Secondary | ICD-10-CM | POA: Diagnosis not present

## 2014-02-25 DIAGNOSIS — C7931 Secondary malignant neoplasm of brain: Secondary | ICD-10-CM | POA: Diagnosis not present

## 2014-02-25 DIAGNOSIS — Z888 Allergy status to other drugs, medicaments and biological substances status: Secondary | ICD-10-CM

## 2014-02-25 DIAGNOSIS — C50411 Malignant neoplasm of upper-outer quadrant of right female breast: Secondary | ICD-10-CM

## 2014-02-25 DIAGNOSIS — Z880 Allergy status to penicillin: Secondary | ICD-10-CM | POA: Diagnosis not present

## 2014-02-25 DIAGNOSIS — D72829 Elevated white blood cell count, unspecified: Secondary | ICD-10-CM | POA: Diagnosis present

## 2014-02-25 DIAGNOSIS — R630 Anorexia: Secondary | ICD-10-CM | POA: Diagnosis present

## 2014-02-25 DIAGNOSIS — M62838 Other muscle spasm: Secondary | ICD-10-CM | POA: Diagnosis present

## 2014-02-25 DIAGNOSIS — K2100 Gastro-esophageal reflux disease with esophagitis, without bleeding: Secondary | ICD-10-CM

## 2014-02-25 DIAGNOSIS — R197 Diarrhea, unspecified: Secondary | ICD-10-CM

## 2014-02-25 DIAGNOSIS — K5900 Constipation, unspecified: Secondary | ICD-10-CM

## 2014-02-25 DIAGNOSIS — H04209 Unspecified epiphora, unspecified lacrimal gland: Secondary | ICD-10-CM

## 2014-02-25 DIAGNOSIS — G44011 Episodic cluster headache, intractable: Secondary | ICD-10-CM

## 2014-02-25 DIAGNOSIS — Z0189 Encounter for other specified special examinations: Secondary | ICD-10-CM

## 2014-02-25 DIAGNOSIS — R5381 Other malaise: Secondary | ICD-10-CM | POA: Diagnosis present

## 2014-02-25 DIAGNOSIS — R51 Headache: Secondary | ICD-10-CM | POA: Diagnosis not present

## 2014-02-25 DIAGNOSIS — R21 Rash and other nonspecific skin eruption: Secondary | ICD-10-CM

## 2014-02-25 DIAGNOSIS — C50919 Malignant neoplasm of unspecified site of unspecified female breast: Secondary | ICD-10-CM | POA: Diagnosis present

## 2014-02-25 LAB — CBC
HCT: 35.3 % — ABNORMAL LOW (ref 36.0–46.0)
Hemoglobin: 12.3 g/dL (ref 12.0–15.0)
MCH: 34.3 pg — AB (ref 26.0–34.0)
MCHC: 34.8 g/dL (ref 30.0–36.0)
MCV: 98.3 fL (ref 78.0–100.0)
Platelets: 233 10*3/uL (ref 150–400)
RBC: 3.59 MIL/uL — AB (ref 3.87–5.11)
RDW: 14.6 % (ref 11.5–15.5)
WBC: 8.9 10*3/uL (ref 4.0–10.5)

## 2014-02-25 LAB — COMPREHENSIVE METABOLIC PANEL
ALT: 19 U/L (ref 0–35)
AST: 24 U/L (ref 0–37)
Albumin: 3.6 g/dL (ref 3.5–5.2)
Alkaline Phosphatase: 99 U/L (ref 39–117)
Anion gap: 13 (ref 5–15)
BUN: 11 mg/dL (ref 6–23)
CALCIUM: 9.5 mg/dL (ref 8.4–10.5)
CO2: 27 meq/L (ref 19–32)
CREATININE: 0.82 mg/dL (ref 0.50–1.10)
Chloride: 101 mEq/L (ref 96–112)
GFR calc non Af Amer: 76 mL/min — ABNORMAL LOW (ref >90)
GFR, EST AFRICAN AMERICAN: 88 mL/min — AB (ref >90)
GLUCOSE: 131 mg/dL — AB (ref 70–99)
Potassium: 4.3 mEq/L (ref 3.7–5.3)
SODIUM: 141 meq/L (ref 137–147)
TOTAL PROTEIN: 7.2 g/dL (ref 6.0–8.3)
Total Bilirubin: 0.2 mg/dL — ABNORMAL LOW (ref 0.3–1.2)

## 2014-02-25 LAB — CBG MONITORING, ED: Glucose-Capillary: 94 mg/dL (ref 70–99)

## 2014-02-25 MED ORDER — DEXAMETHASONE SODIUM PHOSPHATE 10 MG/ML IJ SOLN
10.0000 mg | Freq: Once | INTRAMUSCULAR | Status: AC
  Administered 2014-02-25: 10 mg via INTRAVENOUS
  Filled 2014-02-25: qty 1

## 2014-02-25 MED ORDER — PROCHLORPERAZINE MALEATE 10 MG PO TABS
10.0000 mg | ORAL_TABLET | Freq: Four times a day (QID) | ORAL | Status: DC | PRN
  Administered 2014-02-26 (×3): 10 mg via ORAL
  Filled 2014-02-25 (×4): qty 1

## 2014-02-25 MED ORDER — SODIUM CHLORIDE 0.9 % IJ SOLN: 3.0000 mL | INTRAMUSCULAR | Status: DC | PRN

## 2014-02-25 MED ORDER — TRAMADOL HCL 50 MG PO TABS
100.0000 mg | ORAL_TABLET | Freq: Four times a day (QID) | ORAL | Status: DC | PRN
  Administered 2014-02-25 – 2014-02-26 (×3): 100 mg via ORAL
  Filled 2014-02-25 (×4): qty 2

## 2014-02-25 MED ORDER — SODIUM CHLORIDE 0.9 % IV BOLUS (SEPSIS)
500.0000 mL | Freq: Once | INTRAVENOUS | Status: AC
  Administered 2014-02-25: 500 mL via INTRAVENOUS

## 2014-02-25 MED ORDER — OXYCODONE HCL 5 MG PO TABS
5.0000 mg | ORAL_TABLET | ORAL | Status: DC | PRN
  Filled 2014-02-25: qty 1

## 2014-02-25 MED ORDER — LORAZEPAM 2 MG/ML IJ SOLN
1.0000 mg | Freq: Four times a day (QID) | INTRAMUSCULAR | Status: DC | PRN
  Administered 2014-02-26 – 2014-02-27 (×3): 1 mg via INTRAVENOUS
  Filled 2014-02-25 (×3): qty 1

## 2014-02-25 MED ORDER — HYDROMORPHONE HCL 1 MG/ML IJ SOLN
1.0000 mg | INTRAMUSCULAR | Status: DC | PRN
  Administered 2014-02-26 (×3): 1 mg via INTRAVENOUS
  Filled 2014-02-25 (×5): qty 1

## 2014-02-25 MED ORDER — FLUTICASONE PROPIONATE 50 MCG/ACT NA SUSP
2.0000 | Freq: Every day | NASAL | Status: DC
  Administered 2014-02-26 – 2014-03-10 (×12): 2 via NASAL
  Filled 2014-02-25 (×2): qty 16

## 2014-02-25 MED ORDER — SODIUM CHLORIDE 0.9 % IV SOLN: 250.0000 mL | INTRAVENOUS | Status: DC | PRN

## 2014-02-25 MED ORDER — SODIUM CHLORIDE 0.9 % IJ SOLN
3.0000 mL | Freq: Two times a day (BID) | INTRAMUSCULAR | Status: DC
  Administered 2014-02-26: 3 mL via INTRAVENOUS

## 2014-02-25 MED ORDER — DEXAMETHASONE SODIUM PHOSPHATE 4 MG/ML IJ SOLN
8.0000 mg | Freq: Four times a day (QID) | INTRAMUSCULAR | Status: DC
  Administered 2014-02-26 (×5): 8 mg via INTRAVENOUS
  Filled 2014-02-25 (×11): qty 2

## 2014-02-25 MED ORDER — OLOPATADINE HCL 0.1 % OP SOLN
1.0000 [drp] | Freq: Two times a day (BID) | OPHTHALMIC | Status: DC
  Administered 2014-02-26 – 2014-03-09 (×6): 1 [drp] via OPHTHALMIC
  Filled 2014-02-25 (×3): qty 5

## 2014-02-25 MED ORDER — PANTOPRAZOLE SODIUM 40 MG PO TBEC
40.0000 mg | DELAYED_RELEASE_TABLET | Freq: Every day | ORAL | Status: DC
Start: 2014-02-26 — End: 2014-02-27
  Administered 2014-02-26: 40 mg via ORAL
  Filled 2014-02-25 (×2): qty 1

## 2014-02-25 MED ORDER — ACETAMINOPHEN 325 MG PO TABS
650.0000 mg | ORAL_TABLET | Freq: Four times a day (QID) | ORAL | Status: DC | PRN
Start: 2014-02-25 — End: 2014-03-10
  Administered 2014-02-26 – 2014-02-28 (×3): 650 mg via ORAL
  Filled 2014-02-25 (×3): qty 2

## 2014-02-25 MED ORDER — ACETAMINOPHEN 650 MG RE SUPP
650.0000 mg | Freq: Four times a day (QID) | RECTAL | Status: DC | PRN
Start: 2014-02-25 — End: 2014-03-10

## 2014-02-25 MED ORDER — LEVETIRACETAM IN NACL 500 MG/100ML IV SOLN
500.0000 mg | Freq: Two times a day (BID) | INTRAVENOUS | Status: DC
  Administered 2014-02-26 – 2014-03-05 (×17): 500 mg via INTRAVENOUS
  Filled 2014-02-25 (×21): qty 100

## 2014-02-25 MED ORDER — TRAZODONE HCL 100 MG PO TABS
100.0000 mg | ORAL_TABLET | Freq: Every day | ORAL | Status: DC
  Administered 2014-02-25 – 2014-03-09 (×13): 100 mg via ORAL
  Filled 2014-02-25: qty 1
  Filled 2014-02-25: qty 2
  Filled 2014-02-25 (×10): qty 1
  Filled 2014-02-25: qty 2
  Filled 2014-02-25 (×3): qty 1
  Filled 2014-02-25: qty 2

## 2014-02-25 NOTE — ED Provider Notes (Signed)
CSN: [id number]     Arrival date & time 02/25/14  1647 History   First MD Initiated Contact with Patient 02/25/14 1734     Chief Complaint  Patient presents with  . Altered Mental Status     (Consider location/radiation/quality/duration/timing/severity/associated sxs/prior Treatment) Patient is a 63 y.o. female presenting with altered mental status. The history is provided by the patient.  Altered Mental Status Associated symptoms: headaches and weakness   Associated symptoms: no fever    patient has metastatic breast cancer. Around one week ago she had a palliative mastectomy of the right side. She was doing well for couple days and was discharged home. She states that she then began to feel weak. She began having difficulty picking things up and some difficulty with walking. She also has had L. headache over her entire head. She called her oncologist, who gave her antibiotics for a sinus infection. She also may be having some mild confusion. She also has pain in her neck. No fevers. She's had a decreased appetite. She has had constipation and has had one small bowel movement since surgery.  Past Medical History  Diagnosis Date  . Recurrent sinus infections   . Arthritis   . PONV (postoperative nausea and vomiting)   . GERD (gastroesophageal reflux disease)   . 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 rt breast ca dx'd 12/2011    breast  . Breast cancer   . 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 Anxiety     no problems at present   Past Surgical History  Procedure Laterality Date  . Tubal ligation    . Tubal ligation  1985  . Portacath placement N/A 01/10/2013    Procedure: ULTRASOUND GUIDED PORT-A-CATH INSERTION WITH FLUOROSCOPY;  Surgeon: [name], MD;  Location: [facility];  Service: General;  Laterality: N/A;  . Esophagogastroduodenoscopy N/A 08/19/2013    Procedure: ESOPHAGOGASTRODUODENOSCOPY (EGD);  Surgeon: Garlan Fair,  MD;  Location: [facility];  Service: Endoscopy;  Laterality: N/A;  . Total mastectomy Right 02/17/2014    Procedure: PALLIATIVE  RIGHT MASTECTOMY;  Surgeon: [name], MD;  Location: [facility];  Service: General;  Laterality: Right;   Family History  Problem Relation Age of Onset  . Bladder Cancer Father   . Colon cancer Maternal Grandmother    History  Substance Use Topics  . Smoking status: Never Smoker   . Smokeless tobacco: Never Used  . Alcohol Use: No   OB History   Grav Para Term Preterm Abortions TAB SAB Ect Mult Living                 Review of Systems  Constitutional: Positive for activity change, appetite change and fatigue. Negative for fever.  Respiratory: Negative for shortness of breath.   Cardiovascular: Negative for chest pain.  Gastrointestinal: Positive for constipation.  Neurological: Positive for weakness, numbness and headaches. Negative for syncope and speech difficulty.      Allergies  Ondansetron hcl; Codeine; and Penicillins  Home Medications   Prior to Admission medications   Medication Sig Start Date End Date Taking? Authorizing Provider  acetaminophen (TYLENOL) 325 MG tablet Take 650 mg by mouth every 8 (eight) hours as needed for pain.   Yes Historical Provider, MD  aspirin-acetaminophen-caffeine (EXCEDRIN MIGRAINE) [phone] MG per tablet Take 1 tablet by mouth every 6 (six) hours as needed for headache.   Yes Historical Provider, MD  cephALEXin (KEFLEX) 500 MG capsule Take  500 mg by mouth 2 (two) times daily. Started medication on 02-24-14 02/24/14  Yes Chauncey Cruel, MD  omeprazole (PRILOSEC) 20 MG capsule Take 1 capsule (20 mg total) by mouth 2 (two) times daily before a meal. 02/16/14  Yes Chauncey Cruel, MD  ondansetron (ZOFRAN) 8 MG tablet Take 8 mg by mouth every 8 (eight) hours as needed for nausea or vomiting.   Yes Historical Provider, MD  oxyCODONE (OXY IR/ROXICODONE) 5 MG immediate release tablet Take 1 tablet (5 mg total)  by mouth every 6 (six) hours as needed for moderate pain, severe pain or breakthrough pain. 02/18/14  Yes [name], MD  PATADAY 0.2 % SOLN Place 1 drop into both eyes daily as needed (dry eyes).  10/29/12  Yes Historical Provider, MD  prochlorperazine (COMPAZINE) 10 MG tablet Take 1 tablet (10 mg total) by mouth every 6 (six) hours as needed for nausea. 05/12/13  Yes Chauncey Cruel, MD  tobramycin-dexamethasone [facility]) ophthalmic solution Place 1 drop into both eyes daily as needed. For dry eyes per patient 04/29/13  Yes Amy Milda Smart, PA-C  traMADol (ULTRAM) 50 MG tablet Take 2 tablets (100 mg total) by mouth every 6 (six) hours as needed for moderate pain. 09/01/13  Yes Chauncey Cruel, MD  traZODone (DESYREL) 50 MG tablet Take 100 mg by mouth at bedtime.   Yes Historical Provider, MD  triamcinolone (NASACORT) 55 MCG/ACT nasal inhaler Place 2 sprays into the nose daily.   Yes Historical Provider, MD   BP 128/79  Pulse 99  Temp(Src) 98.5 F (36.9 C) (Oral)  Resp 19  SpO2 100% Physical Exam  Constitutional: She is oriented to person, place, and time. She appears well-developed.  HENT:  Head: Normocephalic.  Eyes: Pupils are equal, round, and reactive to light.  Neck: Neck supple.  Cardiovascular: Normal rate and regular rhythm.   Pulmonary/Chest: Effort normal and breath sounds normal.  Abdominal: There is no tenderness.  Neurological: She is alert and oriented to person, place, and time.  Good grip strength bilaterally. Face is symmetric. Extraocular was intact. Finger-nose intact bilaterally. She does have difficulty picking things up off the table. She did pick up a coin and then later unintentionally dropped. Patient had difficulty standing with her eyes closed and arms out.  Skin: Skin is warm.    ED Course  Procedures (including critical care time) Labs Review Labs Reviewed  CBC - Abnormal; Notable for the following:    RBC 3.59 (*)    HCT 35.3 (*)    MCH 34.3 (*)     All other components within normal limits  COMPREHENSIVE METABOLIC PANEL - Abnormal; Notable for the following:    Glucose, Bld 131 (*)    Total Bilirubin 0.2 (*)    GFR calc non Af Amer 76 (*)    GFR calc Af Amer 88 (*)    All other components within normal limits  URINALYSIS, ROUTINE W REFLEX MICROSCOPIC  BASIC METABOLIC PANEL  CBC  CBG MONITORING, ED    Imaging Review Dg Chest 2 View  02/25/2014   CLINICAL DATA:  63 year old female with right mastectomy 1 week prior.  EXAM: CHEST - 2 VIEW  COMPARISON:  06/24/2013  FINDINGS: Cardiomediastinal silhouette unchanged in size and contour.  Unchanged position of right IJ port catheter with the tip terminating in the superior vena cava.  Surgical changes of right mastectomy with surgical clips overlying right hemi thorax.  As was mentioned on prior plain film, pulmonary nodules  are difficult to resolve with plain film imaging. These are better characterized on prior CT. No confluent airspace disease, pneumothorax, or pleural effusion.  No displaced fracture.  IMPRESSION: No radiographic evidence of acute cardiopulmonary disease.  Unchanged position of right IJ port catheter.  Interval surgical changes of right mastectomy.  Signed,  Dulcy Fanny. Earleen Newport, DO  [facility]  [facility]   Electronically Signed   By: Corrie Mckusick D.O.   On: 02/25/2014 19:56   Ct Head Wo Contrast  02/25/2014   CLINICAL DATA:  63 year old female with right mastectomy 1 week prior.  EXAM: CT HEAD WITHOUT CONTRAST  TECHNIQUE: Contiguous axial images were obtained from the base of the skull through the vertex without intravenous contrast.  COMPARISON:  MRI 07/15/2013  FINDINGS: Unremarkable appearance of the calvarium with no displaced fracture or aggressive lesion.  Trace fluid within the right mastoid air cells. Left mastoid air cells are clear.  No significant paranasal sinus disease.  Unremarkable appearance of the bilateral  orbits.  Geographic regions of hypodensity within the white matter the bilateral hemispheres. On the right this is predominantly temporoparietal, though it extends into the occipital lobe on the right and there is a focal region in the right frontal white matter. This edema contributes to mass effect and subtle midline shift measuring approximately 11 mm at the foramen of Monro.  There is a mixed density rounded lesion measuring 13 mm within the right parietal lobe (image 17), as well as a smaller hyperdensity in the right occipital lobe (image 16.)  To a lesser degree there is confluent hypodensity within the left posterior frontal lobe white matter as well as the left high parietal white matter.  Focal hyperdensity near the anterior limb of the right internal capsule (image 13).  Heterogeneous focus in the right cerebellar hemisphere (image 7).  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 The right-sided edema is worse, and contributes to 1 cm of right to left midline shift at the foramen of Monro. Further evaluation with contrast MRI is recommended.  These results were called by telephone at the time of interpretation on 02/25/2014 at 8:30 pm to Dr. Davonna Belling , who verbally acknowledged these results.  Signed,  Dulcy Fanny. Earleen Newport, DO  [facility]  [facility]   Electronically Signed   By: Corrie Mckusick D.O.   On: 02/25/2014 20:32     EKG Interpretation   Date/Time:  Wednesday February 25 2014 16:52:47 EDT Ventricular Rate:  105 PR Interval:  140 QRS Duration: 66 QT Interval:  348 QTC Calculation: 459 R Axis:   67 Text Interpretation:  Sinus rhythm Confirmed by Alvino Chapel  MD, Ovid Curd  [phone]) on 02/25/2014 6:26:28 PM      MDM   Final diagnoses:  Brain metastases  Primary cancer of right breast with metastasis to other site    Patient with some neurologic  deficits and headaches. She has metastatic breast cancer it is now involving the brain. Will give IV steroids and admitted to internal medicine. I discussed with oncology, who will see the patient in consult tomorrow    Jasper Riling. Alvino Chapel, MD 02/25/14 2237

## 2014-02-25 NOTE — H&P (Addendum)
[facility]                                                                                    Patient Demographics  Carrie Berry, is a 63 y.o. female  CSN: [id number]  MRN: [id number]  DOB - May 20, 1950  Admit Date - 02/25/2014  Outpatient Primary MD for the patient is [name], MD   With History of -  Past Medical History  Diagnosis Date  . Recurrent sinus infections   . Arthritis   . PONV (postoperative nausea and vomiting)   . GERD (gastroesophageal reflux disease)   . 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 rt breast ca dx'd 12/2011    breast  . Breast cancer   . 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 Anxiety     no problems at present      Past Surgical History  Procedure Laterality Date  . Tubal ligation    . Tubal ligation  1985  . Portacath placement N/A 01/10/2013    Procedure: ULTRASOUND GUIDED PORT-A-CATH INSERTION WITH FLUOROSCOPY;  Surgeon: [name], MD;  Location: [place] [facility];  Service: General;  Laterality: N/A;  . Esophagogastroduodenoscopy N/A 08/19/2013    Procedure: ESOPHAGOGASTRODUODENOSCOPY (EGD);  Surgeon: Garlan Fair, MD;  Location: [facility];  Service: Endoscopy;  Laterality: N/A;  . Total mastectomy Right 02/17/2014    Procedure: PALLIATIVE  RIGHT MASTECTOMY;  Surgeon: [name], MD;  Location: [facility];  Service: General;  Laterality: Right;    in for   Chief Complaint  Patient presents with  . Altered Mental Status     HPI  Carrie Berry  is a 63 y.o. 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 Patient also was noticing some clumsiness in her hands. CT of the head showed metastasis to  the brain . Patient had an MRI of the brain last March which was negative for metastasis.    Review of Systems    In addition to the HPI above,  No Fever-chills, No changes with Vision or hearing, No problems swallowing food or Liquids, No Chest pain, Cough or Shortness of Breath, No Abdominal pain, No Nausea or Vommitting, Bowel movements are regular, No Blood in stool or Urine, No dysuria, No new skin rashes or bruises, No new joints pains-aches,  No new weakness, tingling, numbness in any extremity, No recent weight gain or loss, No polyuria, polydypsia or polyphagia, No significant Mental Stressors.  A full 10 point Review of Systems was done, except as stated above, all other Review of Systems were negative.   Social History History  Substance Use Topics  . Smoking status: Never Smoker   . Smokeless tobacco: Never Used  . Alcohol Use: No     Family History Family History  Problem Relation Age of Onset  . Bladder Cancer Father   . Colon  cancer Maternal Grandmother      Prior to Admission medications   Medication Sig Start Date End Date Taking? Authorizing Provider  acetaminophen (TYLENOL) 325 MG tablet Take 650 mg by mouth every 8 (eight) hours as needed for pain.   Yes Historical Provider, MD  aspirin-acetaminophen-caffeine (EXCEDRIN MIGRAINE) [phone] MG per tablet Take 1 tablet by mouth every 6 (six) hours as needed for headache.   Yes Historical Provider, MD  cephALEXin (KEFLEX) 500 MG capsule Take 500 mg by mouth 2 (two) times daily. Started medication on 02-24-14 02/24/14  Yes Chauncey Cruel, MD  omeprazole (PRILOSEC) 20 MG capsule Take 1 capsule (20 mg total) by mouth 2 (two) times daily before a meal. 02/16/14  Yes Chauncey Cruel, MD  ondansetron (ZOFRAN) 8 MG tablet Take 8 mg by mouth every 8 (eight) hours as needed for nausea or vomiting.   Yes Historical Provider, MD  oxyCODONE (OXY IR/ROXICODONE) 5 MG immediate release tablet Take 1 tablet (5 mg  total) by mouth every 6 (six) hours as needed for moderate pain, severe pain or breakthrough pain. 02/18/14  Yes [name], MD  PATADAY 0.2 % SOLN Place 1 drop into both eyes daily as needed (dry eyes).  10/29/12  Yes Historical Provider, MD  prochlorperazine (COMPAZINE) 10 MG tablet Take 1 tablet (10 mg total) by mouth every 6 (six) hours as needed for nausea. 05/12/13  Yes Chauncey Cruel, MD  tobramycin-dexamethasone [facility]) ophthalmic solution Place 1 drop into both eyes daily as needed. For dry eyes per patient 04/29/13  Yes Amy Milda Smart, PA-C  traMADol (ULTRAM) 50 MG tablet Take 2 tablets (100 mg total) by mouth every 6 (six) hours as needed for moderate pain. 09/01/13  Yes Chauncey Cruel, MD  traZODone (DESYREL) 50 MG tablet Take 100 mg by mouth at bedtime.   Yes Historical Provider, MD  triamcinolone (NASACORT) 55 MCG/ACT nasal inhaler Place 2 sprays into the nose daily.   Yes Historical Provider, MD    Allergies  Allergen Reactions  . Ondansetron Hcl Other (See Comments)    HEADACHE, patient states she still takes   . Codeine Nausea And Vomiting    "violently ill"  . Penicillins Rash    Childhood reaction -    Physical Exam  Vitals  Blood pressure 113/78, pulse 92, temperature 98.5 F (36.9 C), temperature source Oral, resp. rate 19, SpO2 100.00%.   1. General white American female, well-developed   2. emotional and in tears , Awake Alert, Oriented X 3.  3. No F.N deficits, ALL C.Nerves Intact,   4. Ears and Eyes appear Normal, Conjunctivae clear, PERRLA. Moist Oral Mucosa.  5. Supple Neck, No JVD, No cervical lymphadenopathy appriciated, No Carotid Bruits.  6. Symmetrical Chest wall movement, Good air movement bilaterally, CTAB. Right mastectomy scar well-healed  7. RRR, No Gallops, Rubs or Murmurs, No Parasternal Heave.  8. Positive Bowel Sounds, Abdomen Soft, Non tender, No organomegaly appreciated,No rebound -guarding or rigidity.  9.  No Cyanosis,  Normal Skin Turgor, No Skin Rash or Bruise.  10. Good muscle tone,  joints appear normal , no effusions, Normal ROM.    Data Review  CBC  Recent Labs Lab 02/25/14 1656  WBC 8.9  HGB 12.3  HCT 35.3*  PLT 233  MCV 98.3  MCH 34.3*  MCHC 34.8  RDW 14.6   ------------------------------------------------------------------------------------------------------------------  Chemistries   Recent Labs Lab 02/25/14 1656  NA 141  K 4.3  CL 101  CO2 27  GLUCOSE 131*  BUN 11  CREATININE 0.82  CALCIUM 9.5  AST 24  ALT 19  ALKPHOS 99  BILITOT 0.2*   ------------------------------------------------------------------------------------------------------------------ CrCl is unknown because both a height and weight (above a minimum accepted value) are required for this calculation. ------------------------------------------------------------------------------------------------------------------ No results found for this basename: TSH, T4TOTAL, FREET3, T3FREE, THYROIDAB,  in the last 72 hours   Coagulation profile No results found for this basename: INR, PROTIME,  in the last 168 hours ------------------------------------------------------------------------------------------------------------------- No results found for this basename: DDIMER,  in the last 72 hours -------------------------------------------------------------------------------------------------------------------  Cardiac Enzymes No results found for this basename: CK, CKMB, TROPONINI, MYOGLOBIN,  in the last 168 hours ------------------------------------------------------------------------------------------------------------------ No components found with this basename: POCBNP,    ---------------------------------------------------------------------------------------------------------------  Imaging results:   Dg Chest 2 View  02/25/2014   CLINICAL DATA:  63 year old female with right mastectomy 1 week prior.   EXAM: CHEST - 2 VIEW  COMPARISON:  06/24/2013  FINDINGS: Cardiomediastinal silhouette unchanged in size and contour.  Unchanged position of right IJ port catheter with the tip terminating in the superior vena cava.  Surgical changes of right mastectomy with surgical clips overlying right hemi thorax.  As was mentioned on prior plain film, pulmonary nodules are difficult to resolve with plain film imaging. These are better characterized on prior CT. No confluent airspace disease, pneumothorax, or pleural effusion.  No displaced fracture.  IMPRESSION: No radiographic evidence of acute cardiopulmonary disease.  Unchanged position of right IJ port catheter.  Interval surgical changes of right mastectomy.  Signed,  Dulcy Fanny. Earleen Newport, DO  [facility]  [facility]   Electronically Signed   By: Corrie Mckusick D.O.   On: 02/25/2014 19:56   Ct Head Wo Contrast  02/25/2014   CLINICAL DATA:  63 year old female with right mastectomy 1 week prior.  EXAM: CT HEAD WITHOUT CONTRAST  TECHNIQUE: Contiguous axial images were obtained from the base of the skull through the vertex without intravenous contrast.  COMPARISON:  MRI 07/15/2013  FINDINGS: Unremarkable appearance of the calvarium with no displaced fracture or aggressive lesion.  Trace fluid within the right mastoid air cells. Left mastoid air cells are clear.  No significant paranasal sinus disease.  Unremarkable appearance of the bilateral orbits.  Geographic regions of hypodensity within the white matter the bilateral hemispheres. On the right this is predominantly temporoparietal, though it extends into the occipital lobe on the right and there is a focal region in the right frontal white matter. This edema contributes to mass effect and subtle midline shift measuring approximately 11 mm at the foramen of Monro.  There is a mixed density rounded lesion measuring 13 mm within the right parietal lobe (image 17), as well as a  smaller hyperdensity in the right occipital lobe (image 16.)  To a lesser degree there is confluent hypodensity within the left posterior frontal lobe white matter as well as the left high parietal white matter.  Focal hyperdensity near the anterior limb of the right internal capsule (image 13).  Heterogeneous focus in the right cerebellar hemisphere (image 7).  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 The right-sided edema is worse, and contributes to 1 cm of right to left midline shift at the foramen of Monro. Further evaluation with contrast MRI is recommended.  These results were called by telephone at the time of interpretation on 02/25/2014 at 8:30 pm to Dr. Davonna Belling , who verbally acknowledged these  results.  Signed,  Dulcy Fanny. Earleen Newport, DO  [facility]  [facility]   Electronically Signed   By: Corrie Mckusick D.O.   On: 02/25/2014 20:32   Mr Breast Bilateral W Wo Contrast  02/02/2014   CLINICAL DATA:  63 year old with current history of metastatic right breast cancer. Patient had a core needle biopsy of a large mass in the outer right breast in August, 2014, pathology invasive ductal carcinoma. Positive right axillary lymph node biopsy at that time. Benign left breast biopsy at that time. Patient has undergone neoadjuvant chemotherapy since that time. MR requested to evaluate response to chemotherapy.  LABS:  Not applicable.  EXAM: BILATERAL BREAST MRI WITH AND WITHOUT CONTRAST  TECHNIQUE: Multiplanar, multisequence MR images of both breasts were obtained prior to and following the intravenous administration of 11 ml of MultiHance.  THREE-DIMENSIONAL MR IMAGE RENDERING ON INDEPENDENT WORKSTATION:  Three-dimensional MR images were rendered by post-processing of the original MR data on an independent workstation. The three-dimensional MR images were interpreted, and  findings are reported in the following complete MRI report for this study. Three dimensional images were evaluated at the independent DynaCad workstation.  COMPARISON:  Bilateral breast MRI 12/27/2012. Mammography 01/30/2014, 12/23/2012, 12/18/2012. Bilateral breast ultrasound 12/18/2012.  FINDINGS: Breast composition: c.  Heterogeneous fibroglandular tissue.  Background parenchymal enhancement: Minimal.  Right breast: Since the prior MRI 12/27/2012, decrease in overall size of the mass involving the outer right breast, predominately the upper-outer quadrant, with evidence of further necrosis. The mass currently measures approximately 3.8 x 3.6 x 4.7 cm (previously 4.6 x 4.8 x 4.7 cm). However, there is now evidence of invasion of the right pectoralis major muscle which was not present previously. An enhancing subcutaneous nodule in the outer subareolar right breast with washout kinetics is new since the prior examination and measures approximately 0.5 cm.  Left breast: No mass or abnormal enhancement.  Lymph nodes: Previously identified right axillary lymphadenopathy has shown significant interval improvement since the prior MRI. A single morphologically abnormal lymph node with thickened cortex remains in the axillary tail of the right breast measuring approximately 8 mm (previously approximately 2.3 cm); this was the lymph node that was biopsied at the time of diagnosis. No new or enlarging lymphadenopathy elsewhere.  Ancillary findings: Previously identified lung metastases are no longer visible on MRI.  IMPRESSION: 1. Mixed response to chemotherapy. The dominant mass in the upper outer right breast has decreased in size (measurements above) and has undergone further necrosis. The previously identified right axillary lymphadenopathy has significantly improved. However, there is now evidence of right pectoralis muscle involvement by tumor and involvement of the skin overlying the outer right breast. There is a  new suspicious 5 mm enhancing mass in the outer subareolar right breast. 2. No evidence of malignancy involving the left breast. 3. Previously identified lung metastases are no longer visible on MRI. 4. No new or enlarging lymphadenopathy.  RECOMMENDATION: Treatment plan.  BI-RADS CATEGORY  6: Known biopsy-proven malignancy.   Electronically Signed   By: Evangeline Dakin M.D.   On: 02/02/2014 08:47    My personal review of EKG: Sinus tachycardia at 105 beats per minute    Assessment & Plan   1. breast cancer with metastasis to the brain: CT shows 1 mm shift    Start Decadron    In the process of contacting oncology    Not a surgical candidate at this time for neurosurgery    Consider palliative care consult  if oncology agrees     Keppra for seizure prophylaxis    Pain control  2. History of recurrent sinusitis     Continue nasal steroids  3. History of anxiety    Ativan when necessary    DVT Prophylaxis Lovenox  AM Labs Ordered, also please review Full Orders  Family Communication: Admission, patients condition and plan of care including tests being ordered have been discussed with the patient and husband who indicate understanding and agree with the plan and Code Status.  Code Status full  Disposition Plan: Home  Time spent in minutes : 33 minutes  Condition GUARDED   @SIGNATURE @

## 2014-02-25 NOTE — ED Notes (Addendum)
Family reports pt had recent left mastectomy. Onset since Friday of mild confusion, difficult gripping objects with bilateral hands, increase in agitation, decreased appetite.

## 2014-02-26 ENCOUNTER — Ambulatory Visit
Admit: 2014-02-26 | Discharge: 2014-02-26 | Disposition: A | Discharge status: Home / Self Care | Payer: BC Managed Care – PPO | Attending: Radiation Oncology | Admitting: Radiation Oncology

## 2014-02-26 ENCOUNTER — Inpatient Hospital Stay (HOSPITAL_COMMUNITY): Payer: BC Managed Care – PPO

## 2014-02-26 LAB — URINALYSIS, ROUTINE W REFLEX MICROSCOPIC
Bilirubin Urine: NEGATIVE
Glucose, UA: NEGATIVE mg/dL
HGB URINE DIPSTICK: NEGATIVE
Ketones, ur: NEGATIVE mg/dL
Leukocytes, UA: NEGATIVE
NITRITE: NEGATIVE
Protein, ur: NEGATIVE mg/dL
Specific Gravity, Urine: 1.011 (ref 1.005–1.030)
UROBILINOGEN UA: 0.2 mg/dL (ref 0.0–1.0)
pH: 7.5 (ref 5.0–8.0)

## 2014-02-26 LAB — CBC
HCT: 34.4 % — ABNORMAL LOW (ref 36.0–46.0)
Hemoglobin: 12 g/dL (ref 12.0–15.0)
MCH: 34.5 pg — ABNORMAL HIGH (ref 26.0–34.0)
MCHC: 34.9 g/dL (ref 30.0–36.0)
MCV: 98.9 fL (ref 78.0–100.0)
Platelets: 201 10*3/uL (ref 150–400)
RBC: 3.48 MIL/uL — AB (ref 3.87–5.11)
RDW: 14.7 % (ref 11.5–15.5)
WBC: 7.4 10*3/uL (ref 4.0–10.5)

## 2014-02-26 LAB — BASIC METABOLIC PANEL
ANION GAP: 12 (ref 5–15)
BUN: 9 mg/dL (ref 6–23)
CALCIUM: 9.4 mg/dL (ref 8.4–10.5)
CO2: 24 mEq/L (ref 19–32)
Chloride: 104 mEq/L (ref 96–112)
Creatinine, Ser: 0.76 mg/dL (ref 0.50–1.10)
GFR calc non Af Amer: 89 mL/min — ABNORMAL LOW (ref >90)
GLUCOSE: 148 mg/dL — AB (ref 70–99)
Potassium: 4.9 mEq/L (ref 3.7–5.3)
Sodium: 140 mEq/L (ref 137–147)

## 2014-02-26 MED ORDER — DEXTROSE-NACL 5-0.9 % IV SOLN
INTRAVENOUS | Status: DC
  Administered 2014-02-26: 22:00:00 via INTRAVENOUS

## 2014-02-26 MED ORDER — GADOBENATE DIMEGLUMINE 529 MG/ML IV SOLN
10.0000 mL | Freq: Once | INTRAVENOUS | Status: AC | PRN
  Administered 2014-02-26: 10 mL via INTRAVENOUS

## 2014-02-26 MED ORDER — DEXTROSE 5 % IV SOLN: INTRAVENOUS | Status: DC

## 2014-02-26 MED ORDER — INFLUENZA VAC SPLIT QUAD 0.5 ML IM SUSY
0.5000 mL | PREFILLED_SYRINGE | INTRAMUSCULAR | Status: AC
  Administered 2014-02-27: 0.5 mL via INTRAMUSCULAR
  Filled 2014-02-26 (×2): qty 0.5

## 2014-02-26 NOTE — Progress Notes (Addendum)
[facility] PROGRESS NOTE  Carrie Berry VHQ:[id number] DOB: 11/14/1950 DOA: 02/25/2014 PCP: [name], MD  Assessment/Plan: 1. Metastatic Breast cancer to brain.  Ct head showed multiple brain metastasis, 1 cm midline shift.  Continue with decadron 8 mg IV every 6 hours.  Continue with Keppra IV for seizure prophylaxis,.  Continue with pain management.  Dr [name] consulted. He will contact radiation oncology.  Will transfer patient to [facility] for definite treatments. Patient agree.   Code Status: Full Code.  Family Communication: Care discussed with patient and husband who at bedside.  Disposition Plan: Transfer to [place] [facility] for definite treatments.    Consultants:  Dr [name].   Procedures:  none  Antibiotics:  none  HPI/Subjective: Sleepy, but wake up to answer questions. Relates the headaches is better. Pain medications helping.    Objective: Filed Vitals:   02/26/14 0621  BP: 113/69  Pulse: 86  Temp: 97.4 F (36.3 C)  Resp: 16    Intake/Output Summary (Last 24 hours) at 02/26/14 4132 Last data filed at 02/26/14 0600  Gross per 24 hour  Intake      0 ml  Output    300 ml  Net   -300 ml   Filed Weights   02/25/14 2236  Weight: 55.6 kg (122 lb 9.2 oz)    Exam:   General:  Sleepy, but wake up   Cardiovascular: S 1, S 2 RRR  Respiratory: CTA  Abdomen: Bs present, soft, NT  Musculoskeletal: no edema  Neuro: answering questions, oriented to place and person. Motor strength 5/5  Data Reviewed: Basic Metabolic Panel:  Recent Labs Lab 02/25/14 1656 02/26/14 0436  NA 141 140  K 4.3 4.9  CL 101 104  CO2 27 24  GLUCOSE 131* 148*  BUN 11 9  CREATININE 0.82 0.76  CALCIUM 9.5 9.4   Liver Function Tests:  Recent Labs Lab 02/25/14 1656  AST 24  ALT 19  ALKPHOS 99  BILITOT 0.2*  PROT 7.2  ALBUMIN 3.6   No results found for this basename: LIPASE, AMYLASE,  in the last 168 hours No results found for this  basename: AMMONIA,  in the last 168 hours CBC:  Recent Labs Lab 02/25/14 1656 02/26/14 0436  WBC 8.9 7.4  HGB 12.3 12.0  HCT 35.3* 34.4*  MCV 98.3 98.9  PLT 233 201   Cardiac Enzymes: No results found for this basename: CKTOTAL, CKMB, CKMBINDEX, TROPONINI,  in the last 168 hours BNP (last 3 results) No results found for this basename: PROBNP,  in the last 8760 hours CBG:  Recent Labs Lab 02/25/14 1840  GLUCAP 94    No results found for this or any previous visit (from the past 240 hour(s)).   Studies: Dg Chest 2 View  02/25/2014   CLINICAL DATA:  63 year old female with right mastectomy 1 week prior.  EXAM: CHEST - 2 VIEW  COMPARISON:  06/24/2013  FINDINGS: Cardiomediastinal silhouette unchanged in size and contour.  Unchanged position of right IJ port catheter with the tip terminating in the superior vena cava.  Surgical changes of right mastectomy with surgical clips overlying right hemi thorax.  As was mentioned on prior plain film, pulmonary nodules are difficult to resolve with plain film imaging. These are better characterized on prior CT. No confluent airspace disease, pneumothorax, or pleural effusion.  No displaced fracture.  IMPRESSION: No radiographic evidence of acute cardiopulmonary disease.  Unchanged position of right IJ port catheter.  Interval surgical changes of right  mastectomy.  Signed,  Dulcy Fanny. Earleen Newport, DO  [facility]  [facility]   Electronically Signed   By: Corrie Mckusick D.O.   On: 02/25/2014 19:56   Ct Head Wo Contrast  02/25/2014   CLINICAL DATA:  63 year old female with right mastectomy 1 week prior.  EXAM: CT HEAD WITHOUT CONTRAST  TECHNIQUE: Contiguous axial images were obtained from the base of the skull through the vertex without intravenous contrast.  COMPARISON:  MRI 07/15/2013  FINDINGS: Unremarkable appearance of the calvarium with no displaced fracture or aggressive lesion.  Trace fluid within the  right mastoid air cells. Left mastoid air cells are clear.  No significant paranasal sinus disease.  Unremarkable appearance of the bilateral orbits.  Geographic regions of hypodensity within the white matter the bilateral hemispheres. On the right this is predominantly temporoparietal, though it extends into the occipital lobe on the right and there is a focal region in the right frontal white matter. This edema contributes to mass effect and subtle midline shift measuring approximately 11 mm at the foramen of Monro.  There is a mixed density rounded lesion measuring 13 mm within the right parietal lobe (image 17), as well as a smaller hyperdensity in the right occipital lobe (image 16.)  To a lesser degree there is confluent hypodensity within the left posterior frontal lobe white matter as well as the left high parietal white matter.  Focal hyperdensity near the anterior limb of the right internal capsule (image 13).  Heterogeneous focus in the right cerebellar hemisphere (image 7).  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 The right-sided edema is worse, and contributes to 1 cm of right to left midline shift at the foramen of Monro. Further evaluation with contrast MRI is recommended.  These results were called by telephone at the time of interpretation on 02/25/2014 at 8:30 pm to Dr. Davonna Belling , who verbally acknowledged these results.  Signed,  Dulcy Fanny. Earleen Newport, DO  [facility]  [facility]   Electronically Signed   By: Corrie Mckusick D.O.   On: 02/25/2014 20:32    Scheduled Meds: . dexamethasone  8 mg Intravenous 4 times per day  . fluticasone  2 spray Each Nare Daily  . levETIRAcetam  500 mg Intravenous Q12H  . olopatadine  1 drop Both Eyes BID  . pantoprazole  40 mg Oral Daily  . sodium chloride  3 mL Intravenous Q12H  . traZODone  100 mg Oral QHS    Continuous Infusions:   Active Problems:   Brain metastases   Metastasis to brain    Time spent: 35 minutes.     Niel Hummer A  [facility] Pager [phone]. If 7PM-7AM, please contact night-coverage at [email or web address], password [facility] 02/26/2014, 7:52 AM  LOS: 1 day

## 2014-02-26 NOTE — Progress Notes (Signed)
  Radiation Oncology         [phone] ________________________________  Name: Carrie Berry  MRN: [id number]  Date: 02/25/2014  DOB: 26-Feb-1951  Chart Note:  I received a telephone call from Dr. [name] regarding this patient with new brain metastases.  She has a head CT without contrast showed multiple metastases and significant edema. At this point, I would recommend MRI with contrast on urgent basis prior to approaching a decision regarding treatment options which include but are not limited to whole brain radiotherapy, stereotactic radiosurgery, and possibly resection.  Due to the urgent need for brain MRI, the patient's transfer from [place] to [facility] long should be timed in order to obtain the earliest MRI of the brain.  ________________________________  Sheral Apley. Tammi Klippel, M.D.

## 2014-02-26 NOTE — Progress Notes (Signed)
Tearful throughout the night,emotional support to pt/family. Pt encouraged to take anti-anxiety med, is resting quietly with eyes closed.

## 2014-02-26 NOTE — Consult Note (Signed)
[place]  [phone]   Requesting Provider: [facility]  Consulting Provider: Tressa Busman, MD  Primary Oncologist: Carrie Decklan Mau, MD   [place]  NOTE  Reason for Consultation: Metastatic Breast Cancer to the lung and liver                                              New Brain Metastases  HPI Carrie Berry is a 63 year old [place] woman with initial diagnosis of  invasive ductal carcinoma of the right breast, grade 2, triple negative, with an MIB-1 of 86%, treated with capecitabine until 10/8  as detailed below. She has metastatic disease to the lung and liver.   She was admitted on 10/6 for palliative right mastectomy for symptomatic 7 cm right fungating breast mass. Path report shows T4b, pNX, pM1 invasive ductal carcinoma of the right breast, grade 3  triple negative, with an MIB-1 of 86%, positive LVI. Invasive tumor 0.1 cm of the nearest margin, mammary skin positive for tumor, and dermal lymphatics positive for tumor. She was discharged on 10/7 in stable condition.  On 10/14 she presented to the ED with significant headache, pounding, diffuse but more pronounced on the left, causing nausea and dizziness upon ambulation. She also reported decreased appetite. She had som vision changes "seeing psychodelic colors in the out corner of my eyes", dysarthria, minimal bilateral hand "clumsiness" with decreased fine motor skills. No new sensory abnormalities other than her known plantar disesthesia secondary to chemo. Mild confusion and some agitation was reported by the family, since 10/9. Denies respiratory or cardiac complaints. Denies new  rashes. No urinary incontinence.  She is very tearful due to new findings. CT of the head with contrast on 10/14 revealed multiple brain metastases of the supratentorial and infratentorial brain, with associated vasogenic edema. The right-sided edema is worse, and contributes to 11 mm of right to  left midline shift at the foramen of Monro. Further evaluation with contrast MRI is planned. She is being transferred to [facility] for continuation of care, including Radiation Oncology consultation for radiation the the brain,  in view of the abnormal findings.    BREAST CANCER HISTORY:   CHIEF COMPLAINT: Metastatic Breast Cancer   CURRENT TREATMENT: capecitabine  Carrie Berry noted a mass in her right breast December of 2013 but did not seek immediate medical attention until August of 2014. Dr. Delfina Berry, her PCP,arranged  bilateral mammography and ultrasonography at [facility] 12/18/2012 and this measured, on the right, a large irregular lobulated mass in the upper outer quadrant which by ultrasound measured 5.7 cm. The right axilla showed some lymph nodes with thickened cortices. In the left breast there was a focal asymmetry at the depth, lateral to the nipple, and by ultrasound there was a 9 mm ill-defined hypoechoic lesion in this location. Biopsy of the left breast lesion showed only fibrocystic changes.  Biopsy of the right breast mass and right axillary adenopathy ([id number]) on 12/23/2012 showed both to be involved by invasive ductal carcinoma, grade 2, triple negative, with an MIB-1 of 86%.  MRI of the breast 12/27/2012 at [facility] imaging showed in the right breast a mass abutting the pectoralis muscle without enhancement of the muscle measuring 4.8 cm. The satellite nodule measuring approximately 3 mm was also noted superior to the mass and several abnormal and enlarged  right axillary lymph nodes were noted, one of which appeared to be necrotic. The largest node measured 2.0 cm. Unfortunately, numerous bilateral pulmonary nodules were also noted. She was on capecitabine, 3 tablets twice daily, one week on and one-week off, due for more Xeloda on 10/20. On 10/6 she had a Palliative right mastectomy for a 7 cm right fungating breast mass, Dr. Zella Richer.   Past Medical History  Diagnosis  Date   Recurrent sinus infections    Arthritis    PONV (postoperative nausea and vomiting)    GERD (gastroesophageal reflux disease)    Antineoplastic chemotherapy induced pancytopenia 06/25/2013   rt breast ca dx'd 12/2011    breast   Breast cancer    Metastasis from malignant tumor of breast 01/2013    to liver   Anxiety     no problems at present   Past Surgical History  Procedure Laterality Date   Tubal ligation     Tubal ligation  1985   Portacath placement N/A 01/10/2013    Procedure: ULTRASOUND GUIDED PORT-A-CATH INSERTION WITH FLUOROSCOPY;  Surgeon: [name], MD;  Location: [place];  Service: General;  Laterality: N/A;   Esophagogastroduodenoscopy N/A 08/19/2013    Procedure: ESOPHAGOGASTRODUODENOSCOPY (EGD);  Surgeon: Garlan Fair, MD;  Location: [facility];  Service: Endoscopy;  Laterality: N/A;   Total mastectomy Right 02/17/2014    Procedure: PALLIATIVE  RIGHT MASTECTOMY;  Surgeon: [name], MD;  Location: [place];  Service: General;  Laterality: Right;     GYNECOLOGIC HISTORY Menarche age 89, first live birth age 22, the patient is Carrie Berry P2. She went through menopause in 2008. She did not take hormone replacement. She took birth control for approximately 22 years remotely.   MEDICATIONS:  Scheduled Meds:  dexamethasone  8 mg Intravenous 4 times per day   fluticasone  2 spray Each Nare Daily   [START ON 02/27/2014] Influenza vac split quadrivalent PF  0.5 mL Intramuscular Tomorrow-1000   levETIRAcetam  500 mg Intravenous Q12H   olopatadine  1 drop Both Eyes BID   pantoprazole  40 mg Oral Daily   sodium chloride  3 mL Intravenous Q12H   traZODone  100 mg Oral QHS   Continuous Infusions:  PRN Meds:.sodium chloride, acetaminophen, acetaminophen, HYDROmorphone (DILAUDID) injection, LORazepam, oxyCODONE, prochlorperazine, sodium chloride, traMADol  ALLERGIES:  Allergies  Allergen Reactions   Ondansetron Hcl Other (See Comments)     HEADACHE, patient states she still takes    Codeine Nausea And Vomiting    "violently ill"   Penicillins Rash    Childhood reaction -    Family History  Problem Relation Age of Onset   Bladder Cancer Father    Colon cancer Maternal Grandmother   the patient's parents are living, both in their 70s. The patient's father was diagnosed with bladder cancer the age of 87. The patient's mother's mother had some type of gastrointestinal cancer. The patient had one brother, no sisters. There is no history of breast or ovarian cancer in the family other than a cousin on the father's side who was diagnosed with breast cancer apparently before the age of 66.     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 She's currently on disability. Her husband Keshonna Valvo [name]"), is vice Development worker, international aid for [employer]. The patient's daughter [name] is a stay-at-home mom in [place], the patient's son Tocarra Gassen unfortunately died in an automobile accident in  December of 2011. The patient has 2 grandchildren. She attends to a local [employer].   ADVANCED DIRECTIVES: Full code  HEALTH MAINTENANCE:  History   Substance Use Topics     Smoking status:  Never Smoker     Smokeless tobacco:  Never Used     Alcohol Use:  No   Colonoscopy: 2005  PAP: Not on file  Bone density: Not on file  Lipid panel: Not on file/Dr. Polite    PHYSICAL EXAMINATION:   Filed Vitals:   02/26/14 0621  BP: 113/69  Pulse: 86  Temp: 97.4 F (36.3 C)  Resp: 16   Filed Weights   02/25/14 2236  Weight: 122 lb 9.2 oz (55.6 kg)   Sclerae unicteric, pupils equal and reactive  Oropharynx clear and moist-- no lesions noted  No cervical or supraclavicular adenopathy  Lungs no rales or rhonchi  Heart regular rate and rhythm. No peripheral edema Abd soft,tender on deep palpation at the right upper quadrant, positive  bowel sounds  MSK no focal spinal tenderness, no upper extremity lymphedema  Neuro:Good grip strength bilaterally. Face is symmetric. Extraocular was intact. difficulty with fine motor skills. intentional tremor noted. gait not tested.  Musculoskeletal: reproducible pain at the right scapular area without masses palpable Skin: remarkable for brittle nails. Breasts: Right mastectomy scar well-healed, No axillary adenopathy was noted  LABORATORY/RADIOLOGY DATA:   Recent Labs Lab 02/25/14 1656 02/26/14 0436  WBC 8.9 7.4  HGB 12.3 12.0  HCT 35.3* 34.4*  PLT 233 201  MCV 98.3 98.9  MCH 34.3* 34.5*  MCHC 34.8 34.9  RDW 14.6 14.7    CMP    Recent Labs Lab 02/25/14 1656 02/26/14 0436  NA 141 140  K 4.3 4.9  CL 101 104  CO2 27 24  GLUCOSE 131* 148*  BUN 11 9  CREATININE 0.82 0.76  CALCIUM 9.5 9.4  AST 24  --   ALT 19  --   ALKPHOS 99  --   BILITOT 0.2*  --         Component Value Date/Time   BILITOT 0.2* 02/25/2014 1656   BILITOT 0.37 02/02/2014 1550    Urinalysis    Component Value Date/Time   COLORURINE YELLOW 02/26/2014 0635   APPEARANCEUR CLOUDY* 02/26/2014 0635   LABSPEC 1.011 02/26/2014 0635   PHURINE 7.5 02/26/2014 0635   GLUCOSEU NEGATIVE 02/26/2014 0635   HGBUR NEGATIVE 02/26/2014 0635   BILIRUBINUR NEGATIVE 02/26/2014 0635   [place] 02/26/2014 0635   PROTEINUR NEGATIVE 02/26/2014 0635   UROBILINOGEN 0.2 02/26/2014 0635   NITRITE NEGATIVE 02/26/2014 0635   LEUKOCYTESUR NEGATIVE 02/26/2014 0635     Liver Function Tests:  Recent Labs Lab 02/25/14 1656  AST 24  ALT 19  ALKPHOS 99  BILITOT 0.2*  PROT 7.2  ALBUMIN 3.6   CBG:  Recent Labs Lab 02/25/14 1840  GLUCAP 94   Radiology Studies:  Dg Chest 2 View  02/25/2014   CLINICAL DATA:  63 year old female with right mastectomy 1 week prior.  EXAM: CHEST - 2 VIEW  COMPARISON:  06/24/2013  FINDINGS: Cardiomediastinal silhouette unchanged in size and contour.  Unchanged  position of right IJ port catheter with the tip terminating in the superior vena cava.  Surgical changes of right mastectomy with surgical clips overlying right hemi thorax.  As was mentioned on prior plain film, pulmonary nodules are difficult to resolve with plain film imaging. These are better characterized on prior CT. No confluent airspace disease, pneumothorax, or  pleural effusion.  No displaced fracture.  IMPRESSION: No radiographic evidence of acute cardiopulmonary disease.  Unchanged position of right IJ port catheter.  Interval surgical changes of right mastectomy.  Signed,  Dulcy Fanny. Earleen Newport, DO  [facility]  [facility]   Electronically Signed   By: Corrie Mckusick D.O.   On: 02/25/2014 19:56   Ct Head Wo Contrast  02/25/2014   CLINICAL DATA:  63 year old female with right mastectomy 1 week prior.  EXAM: CT HEAD WITHOUT CONTRAST  TECHNIQUE: Contiguous axial images were obtained from the base of the skull through the vertex without intravenous contrast.  COMPARISON:  MRI 07/15/2013  FINDINGS: Unremarkable appearance of the calvarium with no displaced fracture or aggressive lesion.  Trace fluid within the right mastoid air cells. Left mastoid air cells are clear.  No significant paranasal sinus disease.  Unremarkable appearance of the bilateral orbits.  Geographic regions of hypodensity within the white matter the bilateral hemispheres. On the right this is predominantly temporoparietal, though it extends into the occipital lobe on the right and there is a focal region in the right frontal white matter. This edema contributes to mass effect and subtle midline shift measuring approximately 11 mm at the foramen of Monro.  There is a mixed density rounded lesion measuring 13 mm within the right parietal lobe (image 17), as well as a smaller hyperdensity in the right occipital lobe (image 16.)  To a lesser degree there is confluent hypodensity within the left  posterior frontal lobe white matter as well as the left high parietal white matter.  Focal hyperdensity near the anterior limb of the right internal capsule (image 13).  Heterogeneous focus in the right cerebellar hemisphere (image 7).  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 The right-sided edema is worse, and contributes to 1 cm of right to left midline shift at the foramen of Monro. Further evaluation with contrast MRI is recommended.  These results were called by telephone at the time of interpretation on 02/25/2014 at 8:30 pm to Dr. Davonna Belling , who verbally acknowledged these results.  Signed,  Dulcy Fanny. Earleen Newport, DO  [facility]  [facility]   Electronically Signed   By: Corrie Mckusick D.O.   On: 02/25/2014 20:32   Mr Breast Bilateral W Wo Contrast  02/02/2014   CLINICAL DATA:  63 year old with current history of metastatic right breast cancer. Patient had a core needle biopsy of a large mass in the outer right breast in August, 2014, pathology invasive ductal carcinoma. Positive right axillary lymph node biopsy at that time. Benign left breast biopsy at that time. Patient has undergone neoadjuvant chemotherapy since that time. MR requested to evaluate response to chemotherapy.  LABS:  Not applicable.  EXAM: BILATERAL BREAST MRI WITH AND WITHOUT CONTRAST  TECHNIQUE: Multiplanar, multisequence MR images of both breasts were obtained prior to and following the intravenous administration of 11 ml of MultiHance.  THREE-DIMENSIONAL MR IMAGE RENDERING ON INDEPENDENT WORKSTATION:  Three-dimensional MR images were rendered by post-processing of the original MR data on an independent workstation. The three-dimensional MR images were interpreted, and findings are reported in the following complete MRI report for this study. Three dimensional images were evaluated at the  independent DynaCad workstation.  COMPARISON:  Bilateral breast MRI 12/27/2012. Mammography 01/30/2014, 12/23/2012, 12/18/2012. Bilateral breast ultrasound 12/18/2012.  FINDINGS: Breast composition: c.  Heterogeneous fibroglandular tissue.  Background parenchymal enhancement: Minimal.  Right breast: Since the prior MRI 12/27/2012, decrease in overall size of the mass involving the outer right breast, predominately the upper-outer quadrant, with evidence of further necrosis. The mass currently measures approximately 3.8 x 3.6 x 4.7 cm (previously 4.6 x 4.8 x 4.7 cm). However, there is now evidence of invasion of the right pectoralis major muscle which was not present previously. An enhancing subcutaneous nodule in the outer subareolar right breast with washout kinetics is new since the prior examination and measures approximately 0.5 cm.  Left breast: No mass or abnormal enhancement.  Lymph nodes: Previously identified right axillary lymphadenopathy has shown significant interval improvement since the prior MRI. A single morphologically abnormal lymph node with thickened cortex remains in the axillary tail of the right breast measuring approximately 8 mm (previously approximately 2.3 cm); this was the lymph node that was biopsied at the time of diagnosis. No new or enlarging lymphadenopathy elsewhere.  Ancillary findings: Previously identified lung metastases are no longer visible on MRI.  IMPRESSION: 1. Mixed response to chemotherapy. The dominant mass in the upper outer right breast has decreased in size (measurements above) and has undergone further necrosis. The previously identified right axillary lymphadenopathy has significantly improved. However, there is now evidence of right pectoralis muscle involvement by tumor and involvement of the skin overlying the outer right breast. There is a new suspicious 5 mm enhancing mass in the outer subareolar right breast. 2. No evidence of malignancy involving the left  breast. 3. Previously identified lung metastases are no longer visible on MRI. 4. No new or enlarging lymphadenopathy.  RECOMMENDATION: Treatment plan.  BI-RADS CATEGORY  6: Known biopsy-proven malignancy.   Electronically Signed   By: Evangeline Dakin M.D.   On: 02/02/2014 08:47    FINAL for ALTAMESE, DEGUIRE ([id number]) Patient: ETRULIA, ZARR Collected: 02/17/2014 Client: [place] Accession: [id number] Received: 02/17/2014 [name], MD DOB: October 22, 1950 Age: 88 Gender: F Reported: 02/19/2014 [address] Patient Ph: [phone] MRN #: [id number] [place], [place] [address] Visit #: [id number] Chart #: Phone:  Fax: CC: Christene Slates, MD Bary Castilla, RN REPORT OF SURGICAL PATHOLOGY FINAL DIAGNOSIS Diagnosis Breast, simple mastectomy, Right - INVASIVE DUCTAL CARCINOMA, SEE COMMENT. - POSITIVE FOR LYMPH VASCULAR INVASION. - INVASIVE TUMOR IS 0.1 CM FROM THE NEAREST MARGIN (DEEP). - MAMMARY SKIN, POSITIVE FOR TUMOR - DERMAL LYMPHATICS, POSITIVE FOR TUMOR - SEE TUMOR SYNOPTIC TEMPLATE BELOW. Microscopic Comment BREAST, INVASIVE TUMOR, WITHOUT LYMPH NODE SAMPLING Specimen, including laterality and lymph node sampling (sentinel, non-sentinel): Right breast without lymph node sampling Procedure: Simple mastectomy Histologic type: Ductal Grade: III of III Tubule formation: 3 Nuclear pleomorphism: 3 Mitotic: 3 Tumor size (gross measurement): 4.8 cm Margins: Invasive, distance to closest margin: 0.1 cm, see comment In-situ, distance to closest margin: N/A If margin positive, focally or broadly: NA Lymphovascular invasion: Present Ductal carcinoma in situ: N/A Grade: N/A Extensive intraductal component: N/A Lobular neoplasia: N/A Tumor focality: Unifocal Treatment effect: Present If present, treatment effect in breast tissue, lymph nodes or both: Breast tissue Extent of tumor: Skin: Positive for tumor 1 of 3 FINAL for MERCED, BROUGHAM  ([id number]) Microscopic Comment(continued) Nipple: N/A Skeletal muscle: Negative for tumor, see comment. Breast prognostic profile: Not repeated. Estrogen receptor: Previous study demonstrated 0% positivity. [phone]) Progesterone receptor: Previous study demonstrated 0% positivity. [phone]) Her 2 neu: Not repeated, previous study demonstrated no amplification (1.95) ([id number]) Ki-67: Not repeated, previous study demonstrated 86% proliferation rate ([id number]) Non-neoplastic breast: Coarse vascular calcifications, neoadjuvant related  change, fibrocystic change and usual ductal hyperplasia. TNM: ypT4b, pNX, pM1 Comments: Representative sections from the deep margin demonstrates a well demarcated area of non-viable tumor with extensive neoadjuvant related fibroinflammatory change. The fibroinflammation extends into muscle. The fibroinflammatory reaction is 0.1 cm from the posterior margin. If breast prognostic studies are indicated, please contact pathology. The previous liver core biopsy demonstrating positive metastatic mammary carcinoma is noted ([id number]). Mali RUND DO Pathologist, Electronic Signature   ASSESSMENT AND PLAN:  63 year old [place] woman with   Stage 4 Metastatic Breast cancer to lung and liver New Brain Metastases patient was on capecitabine, 3 tablets twice daily, one week on and one-week off discontinued on 02/19/14 following Palliative right mastectomy on 02/18/15-due for more chemo on 10/20 although treatment may change). Pathology report showed T4b, pNX, pM1 invasive ductal carcinoma of the right breast, grade 3  triple negative, with an MIB-1 of 86%, positive LVI. Invasive tumor 0.1 cm of the nearest margin, mammary skin positive for tumor, and dermal lymphatics positive for tumor. She presented to the ED on 10/14 with headaches, nausea, decreased fine motor skills, mild confusion, which prompted a CT head, revealing multiple brain metastases with  1cm midline shift. She was placed on  Decadron 8 mg IV every 6 hours and Levitra 500 mg IV for seizure prophylaxis.  She is being transferred to [facility] for oncological treatment She will need MRI of the brain for detailed findings, and possibly complete staging CT of the chest, abdomen and pelvis (last performed in 07/2013) and tumor markers. Radiation Oncology to consult upon arrival Dr. [name] to see patient and proceed with further recommendations  Other medical issues as per admitting team   **Disclaimer: This note was dictated with voice recognition software. Similar sounding words can inadvertently be transcribed and this note may contain transcription errors which may not have been corrected upon publication of note.**  WERTMAN,SARA E, PA-C 02/26/2014, 9:16 AM   ADDENDUM:  63 y.o. [name], [place] woman with a history of breast cancer metastatic at presentation August 2014, with lung and liver involvement, treated as summarized below, admitted 02/25/2014 with nausea, vomiting, headaches, and found to have multiple bilateral brain metastases.  TREATMENT HISTORY  (0) status post right breast upper outer quadrant and right axillary lymph node biopsy 12/23/2012 for a clinical T3 N1 M1, stage IV invasive ductal carcinoma, grade 3, triple negative, with an MIB-1 of 86%.   (1) right liver lobe biopsy 01/21/2013 confirms metastatic adenocarcinoma; scans show involvement of the liver, lungs, and likely bone.   (2) enrolled in [facility] study [id number] (docetaxel + oral gamma secretase inibitor [id number]), received one cycle starting 02/04/2013 but withdrew because of poor tolerance despite treatment interruptions and decreased dosing of the oral component   (3) chest CT in early November 2014 was compared with studies in August 2014 and showed interval progression of metastatic breast cancer, with an enlarging primary right breast mass, enlarging right axillary lymph nodes, enlarging  pulmonary nodules and new/enlarging hepatic metastases.   (4) Abraxane started 03/19/2013, given day 1 and day 8 of each 21 day cycle, stopped with the day 1 cycle 3 dose (04/29/2013) because of local progression of disease   (5) cyclophosphamide and doxorubicin started 05/20/2013, given in dose dense fashion with Neulasta support on day 2. Completed 4 cycles 07/03/2013, with the final cycle dose reduced 15% because of an episode of febrile neutropenia after cycle 3. Restaging studies 07/15/2013 showed partial response.   (6) started capecitabine April  2015, 1.5 g by mouth twice a day, 7 days on 7 days off, last dose 02/08/2014  (7) s/p Right simple mastectomy 02/17/2014 to optimize local control, the pathology ([id number]) showing an invasive ductal carcinoma measuring 4.8 cm, grade 3, with close but negative margins and evidence of treatment response  (8) multiple brain metastases noted on brain  MRI 02/26/2014  PLAN: (a) consult placed to radiation oncology re optimal treatment of patient's multiple brain metastases  (b) restage with CT scan of chest/abd/pelvis once patient more stable clinically  (c) stop capecitabine; address systemic therapy questions after radiation completed and in light of upcoming scans  (d) optimize nausea, pain and constipation control  (e) remains full code at this point  (f) let surgery know of admission (when should staples be removed?)  I personally saw this patient and performed a substantive portion of this encounter with the listed APP documented above.   Chauncey Cruel, MD

## 2014-02-26 NOTE — Progress Notes (Signed)
Patient admitted from [place] via [facility], awake, alert, oriented . Patient vomiting greenish fluid. Made comfortable in bed, vital signs taken. Medicated for vomiting.

## 2014-02-26 NOTE — Discharge Summary (Signed)
Physician Discharge Summary  Patient ID: Carrie Berry MRN: [id number] DOB/AGE: Oct 09, 1950 63 y.o.  Admit date: 02/17/2014 Discharge date: 02/26/2014  Admission Diagnoses:  Stage IV breast cancer  Discharge Diagnoses:    Breast cancer of right female breast s/p palliative mastectomy 02/17/14   Discharged Condition: good  Hospital Course: She underwent the right mastectomy and tolerated it well.  She was able to be discharged on POD# 1.  Discharge instructions were given to her.  She will follow up in the office in one week.  Consults: None  Significant Diagnostic Studies: none  Treatments: surgery: Palliative right mastectomy.  Discharge Exam: Blood pressure 142/63, pulse 83, temperature 99.2 F (37.3 C), temperature source Oral, resp. rate 16, height 5' 3.5" (1.613 m), weight 122 lb 9.6 oz (55.611 kg), SpO2 99.00%.   Disposition: 01-Home or Self Care     Medication List    STOP taking these medications       capecitabine 500 MG tablet  Commonly known as:  XELODA     tobramycin-dexamethasone ophthalmic solution  Commonly known as:  TOBRADEX      TAKE these medications       acetaminophen 325 MG tablet  Commonly known as:  TYLENOL  Take 650 mg by mouth every 8 (eight) hours as needed for pain.     aspirin-acetaminophen-caffeine 824-235-36 MG per tablet  Commonly known as:  EXCEDRIN MIGRAINE  Take 1 tablet by mouth every 6 (six) hours as needed for headache.     omeprazole 20 MG capsule  Commonly known as:  PRILOSEC  Take 1 capsule (20 mg total) by mouth 2 (two) times daily before a meal.     ondansetron 8 MG tablet  Commonly known as:  ZOFRAN  Take 8 mg by mouth every 8 (eight) hours as needed for nausea or vomiting.     oxyCODONE 5 MG immediate release tablet  Commonly known as:  Oxy IR/ROXICODONE  Take 1 tablet (5 mg total) by mouth every 6 (six) hours as needed for moderate pain, severe pain or breakthrough pain.     PATADAY 0.2 % Soln  Generic drug:   Olopatadine HCl  Place 1 drop into both eyes daily as needed (dry eyes).     prochlorperazine 10 MG tablet  Commonly known as:  COMPAZINE  Take 1 tablet (10 mg total) by mouth every 6 (six) hours as needed for nausea.     traMADol 50 MG tablet  Commonly known as:  ULTRAM  Take 2 tablets (100 mg total) by mouth every 6 (six) hours as needed for moderate pain.     traZODone 50 MG tablet  Commonly known as:  DESYREL  Take 100 mg by mouth at bedtime.     triamcinolone 55 MCG/ACT nasal inhaler  Commonly known as:  NASACORT  Place 2 sprays into the nose daily.         Signed: [name] 02/26/2014, 8:38 AM

## 2014-02-26 NOTE — Progress Notes (Signed)
Report called to Legrand Rams, RN on 3W oncology at [facility]. Pt will be going to room 1338. Pt will be transporting via [facility] which states it might be two hours before transport is available. Pt and family notified.

## 2014-02-27 ENCOUNTER — Ambulatory Visit
Admit: 2014-02-27 | Discharge: 2014-02-27 | Disposition: A | Discharge status: Home / Self Care | Payer: BC Managed Care – PPO | Attending: Radiation Oncology | Admitting: Radiation Oncology

## 2014-02-27 ENCOUNTER — Inpatient Hospital Stay (HOSPITAL_COMMUNITY): Payer: BC Managed Care – PPO

## 2014-02-27 ENCOUNTER — Ambulatory Visit
Admit: 2014-02-27 | Discharge: 2014-02-27 | Disposition: A | Discharge status: Home / Self Care | Payer: BC Managed Care – PPO | Source: Ambulatory Visit | Attending: Radiation Oncology | Admitting: Radiation Oncology

## 2014-02-27 DIAGNOSIS — Z510 Encounter for antineoplastic radiation therapy: Secondary | ICD-10-CM | POA: Insufficient documentation

## 2014-02-27 DIAGNOSIS — C50411 Malignant neoplasm of upper-outer quadrant of right female breast: Secondary | ICD-10-CM | POA: Insufficient documentation

## 2014-02-27 DIAGNOSIS — C7931 Secondary malignant neoplasm of brain: Secondary | ICD-10-CM | POA: Insufficient documentation

## 2014-02-27 DIAGNOSIS — G44011 Episodic cluster headache, intractable: Secondary | ICD-10-CM

## 2014-02-27 DIAGNOSIS — R51 Headache: Secondary | ICD-10-CM

## 2014-02-27 DIAGNOSIS — C787 Secondary malignant neoplasm of liver and intrahepatic bile duct: Secondary | ICD-10-CM

## 2014-02-27 DIAGNOSIS — C7800 Secondary malignant neoplasm of unspecified lung: Secondary | ICD-10-CM

## 2014-02-27 DIAGNOSIS — G936 Cerebral edema: Secondary | ICD-10-CM

## 2014-02-27 LAB — GLUCOSE, CAPILLARY
GLUCOSE-CAPILLARY: 122 mg/dL — AB (ref 70–99)
Glucose-Capillary: 119 mg/dL — ABNORMAL HIGH (ref 70–99)

## 2014-02-27 MED ORDER — TRAMADOL HCL 50 MG PO TABS
100.0000 mg | ORAL_TABLET | Freq: Four times a day (QID) | ORAL | Status: DC
  Administered 2014-02-27 – 2014-03-10 (×45): 100 mg via ORAL
  Filled 2014-02-27 (×46): qty 2

## 2014-02-27 MED ORDER — SODIUM CHLORIDE 0.9 % IV SOLN
INTRAVENOUS | Status: DC
  Administered 2014-02-27 – 2014-03-03 (×8): via INTRAVENOUS
  Administered 2014-03-04: 1000 mL via INTRAVENOUS
  Administered 2014-03-05 – 2014-03-09 (×5): via INTRAVENOUS
  Administered 2014-03-10: 1000 mL via INTRAVENOUS

## 2014-02-27 MED ORDER — PANTOPRAZOLE SODIUM 40 MG IV SOLR
40.0000 mg | INTRAVENOUS | Status: DC
  Administered 2014-02-27 – 2014-02-28 (×2): 40 mg via INTRAVENOUS
  Filled 2014-02-27 (×3): qty 40

## 2014-02-27 MED ORDER — SODIUM CHLORIDE 0.9 % IV SOLN
250.0000 mL | INTRAVENOUS | Status: DC | PRN
  Administered 2014-02-27: 250 mL via INTRAVENOUS

## 2014-02-27 MED ORDER — POLYETHYLENE GLYCOL 3350 17 G PO PACK
17.0000 g | PACK | Freq: Every day | ORAL | Status: DC
  Administered 2014-02-27 – 2014-02-28 (×2): 17 g via ORAL
  Filled 2014-02-27 (×3): qty 1

## 2014-02-27 MED ORDER — IOHEXOL 300 MG/ML  SOLN: 25.0000 mL | INTRAMUSCULAR | Status: AC

## 2014-02-27 MED ORDER — PROCHLORPERAZINE EDISYLATE 5 MG/ML IJ SOLN
10.0000 mg | Freq: Three times a day (TID) | INTRAMUSCULAR | Status: DC
  Administered 2014-02-27 – 2014-03-03 (×18): 10 mg via INTRAVENOUS
  Filled 2014-02-27 (×24): qty 2

## 2014-02-27 MED ORDER — DOCUSATE SODIUM 100 MG PO CAPS
200.0000 mg | ORAL_CAPSULE | Freq: Two times a day (BID) | ORAL | Status: DC
  Administered 2014-02-27 – 2014-03-02 (×7): 200 mg via ORAL
  Filled 2014-02-27 (×12): qty 2

## 2014-02-27 MED ORDER — DEXAMETHASONE SODIUM PHOSPHATE 10 MG/ML IJ SOLN
8.0000 mg | Freq: Four times a day (QID) | INTRAMUSCULAR | Status: DC
  Administered 2014-02-27 – 2014-03-02 (×14): 8 mg via INTRAVENOUS
  Filled 2014-02-27: qty 0.8
  Filled 2014-02-27: qty 1
  Filled 2014-02-27 (×12): qty 0.8

## 2014-02-27 MED ORDER — INSULIN ASPART 100 UNIT/ML ~~LOC~~ SOLN
0.0000 [IU] | Freq: Three times a day (TID) | SUBCUTANEOUS | Status: DC
  Administered 2014-02-27: 1 [IU] via SUBCUTANEOUS
  Administered 2014-02-28: 2 [IU] via SUBCUTANEOUS
  Administered 2014-02-28 – 2014-03-01 (×3): 1 [IU] via SUBCUTANEOUS
  Administered 2014-03-02: 2 [IU] via SUBCUTANEOUS
  Administered 2014-03-02 – 2014-03-03 (×2): 1 [IU] via SUBCUTANEOUS

## 2014-02-27 NOTE — Progress Notes (Signed)
Came to see her but she was not in the room.  Will check on her later today or tomorrow.

## 2014-02-27 NOTE — Progress Notes (Signed)
Radiation Oncology         (336) [phone] ________________________________  Initial inpatient Consultation  Name: Carrie Berry MRN: [id number]  Date: 02/25/2014  DOB: 1951/03/27    YK:DXIPJA,SNKNLZ D, MD  No ref. provider found   REFERRING PHYSICIAN: Gus Magrinat MD   DIAGNOSIS: 63 year old woman with at least 95 brain metastases from metastatic breast cancer    ICD-9-CM ICD-10-CM  1. Brain metastases 198.3 C79.31    HISTORY OF PRESENT ILLNESS::Carrie Berry is a 63 y.o. female with initial diagnosis of invasive ductal carcinoma of the right breast, grade 2, triple negative, with an MIB-1 of 86%, treated with capecitabine until 10/8 as detailed below. She has metastatic disease to the lung and liver.  She was admitted on 10/6 for palliative right mastectomy for symptomatic 7 cm right fungating breast mass. Path report shows T4b, pNX, pM1 invasive ductal carcinoma of the right breast, grade 3 triple negative, with an MIB-1 of 86%, positive LVI. Invasive tumor 0.1 cm of the nearest margin, mammary skin positive for tumor, and dermal lymphatics positive for tumor. She was discharged on 10/7 in stable condition.  On 10/14 she presented to the ED with significant headache, pounding, diffuse but more pronounced on the left, causing nausea and dizziness upon ambulation. She also reported decreased appetite. She had som vision changes "seeing psychodelic colors in the out corner of my eyes", dysarthria, minimal bilateral hand "clumsiness" with decreased fine motor skills. No new sensory abnormalities other than her known plantar disesthesia secondary to chemo. Mild confusion and some agitation was reported by the family, since 10/9. Denies respiratory or cardiac complaints. Denies new rashes. No urinary incontinence.  CT of the head with contrast on 10/14 revealed multiple brain metastases of the supratentorial and infratentorial brain, with associated vasogenic edema. The right-sided edema is worse,  and contributes to 11 mm of right to left midline shift at the foramen of Monro. Subsequent brain MRI shows at least 17 enhancing brain metastases measuring up to 18 mm. She has kindly been referred today for consideration of radiation treatment.  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    PAST SURGICAL HISTORY: Past Surgical History  Procedure Laterality Date  . Tubal ligation    . Tubal ligation  1985  . Portacath placement N/A 01/10/2013    Procedure: ULTRASOUND GUIDED PORT-A-CATH INSERTION WITH FLUOROSCOPY;  Surgeon: [name], MD;  Location: [place];  Service: General;  Laterality: N/A;  . Esophagogastroduodenoscopy N/A 08/19/2013    Procedure: ESOPHAGOGASTRODUODENOSCOPY (EGD);  Surgeon: Garlan Fair, MD;  Location: [facility];  Service: Endoscopy;  Laterality: N/A;  . Total mastectomy Right 02/17/2014    Procedure: PALLIATIVE  RIGHT MASTECTOMY;  Surgeon: [name], MD;  Location: [place];  Service: General;  Laterality: Right;    FAMILY HISTORY: family history includes Bladder Cancer in her father; Colon cancer in her maternal grandmother.  SOCIAL HISTORY:  reports that she has never smoked. She has never used smokeless tobacco. She reports that she does not drink alcohol or use illicit drugs.  ALLERGIES: Ondansetron hcl; Codeine; and Penicillins  MEDICATIONS:  Current Facility-Administered Medications  Medication Dose Route Frequency Provider Last Rate Last Dose  . 0.9 %  sodium chloride infusion   Intravenous  Continuous Chauncey Cruel, MD      . acetaminophen (TYLENOL) tablet 650 mg  650 mg Oral Q6H PRN Merton Border, MD   650 mg at  02/26/14 1933   Or  . acetaminophen (TYLENOL) suppository 650 mg  650 mg Rectal Q6H PRN Merton Border, MD      . dexamethasone (DECADRON) injection 8 mg  8 mg Intravenous 4 times per day Belkys A Regalado, MD   8 mg at 02/27/14 0600  . docusate sodium (COLACE) capsule 200 mg  200 mg Oral BID Chauncey Cruel, MD      . fluticasone [facility]) 50 MCG/ACT nasal spray 2 spray  2 spray Each Nare Daily Merton Border, MD   2 spray at 02/26/14 1001  . HYDROmorphone (DILAUDID) injection 1 mg  1 mg Intravenous Q4H PRN Merton Border, MD   1 mg at 02/26/14 1455  . Influenza vac split quadrivalent PF (FLUARIX) injection 0.5 mL  0.5 mL Intramuscular Tomorrow-1000 Belkys A Regalado, MD      . insulin aspart (novoLOG) injection 0-9 Units  0-9 Units Subcutaneous TID WC Chauncey Cruel, MD      . levETIRAcetam (KEPPRA) IVPB 500 mg/100 mL premix  500 mg Intravenous Q12H Merton Border, MD   500 mg at 02/26/14 2217  . LORazepam (ATIVAN) injection 1 mg  1 mg Intravenous Q6H PRN Merton Border, MD   1 mg at 02/27/14 0153  . olopatadine (PATANOL) 0.1 % ophthalmic solution 1 drop  1 drop Both Eyes BID Merton Border, MD   1 drop at 02/26/14 2222  . oxyCODONE (Oxy IR/ROXICODONE) immediate release tablet 5 mg  5 mg Oral Q4H PRN Merton Border, MD      . pantoprazole (PROTONIX) injection 40 mg  40 mg Intravenous Q24H Chauncey Cruel, MD      . polyethylene glycol (MIRALAX / GLYCOLAX) packet 17 g  17 g Oral Daily Chauncey Cruel, MD      . prochlorperazine (COMPAZINE) injection 10 mg  10 mg Intravenous TID AC & HS Chauncey Cruel, MD   10 mg at 02/27/14 0847  . sodium chloride 0.9 % injection 3 mL  3 mL Intravenous Q12H Merton Border, MD   3 mL at 02/26/14 1000  . sodium chloride 0.9 % injection 3 mL  3 mL Intravenous PRN Merton Border, MD      . traMADol Veatrice Bourbon) tablet 100 mg  100 mg Oral 4 times per day Chauncey Cruel, MD      . traZODone (DESYREL) tablet 100 mg  100 mg Oral QHS Merton Border, MD   100 mg at 02/26/14 2221    REVIEW OF  SYSTEMS:  A 15 point review of systems is documented in the electronic medical record. This was obtained by the nursing staff. However, I reviewed this with the patient to discuss relevant findings and make appropriate changes.  A comprehensive review of systems was negative.   PHYSICAL EXAM:  height is 5' 3.5" (1.613 m) and weight is 122 lb 9.2 oz (55.6 kg). Her oral temperature is 98.2 F (36.8 C). Her blood pressure is 122/65 and her pulse is 87. Her respiration is 15 and oxygen saturation is 98%.   The patient is somnolent today, and detailed exam was deferred.  Per medical oncology Sclerae unicteric, pupils equal and reactive  Oropharynx clear and moist-- no lesions noted  No cervical or supraclavicular adenopathy  Lungs no rales or rhonchi  Heart regular rate and rhythm. No  peripheral edema  Abd soft,tender on deep palpation at the right upper quadrant, positive bowel sounds  MSK no focal spinal tenderness, no upper extremity lymphedema  Neuro:Good grip strength bilaterally. Face is symmetric. Extraocular was intact. difficulty with fine motor skills. intentional tremor noted. gait not tested.  Musculoskeletal: reproducible pain at the right scapular area without masses palpable  Skin: remarkable for brittle nails.  Breasts: Right mastectomy scar well-healed, No axillary adenopathy was noted   KPS = 40  100 - Normal; no complaints; no evidence of disease. 90   - Able to carry on normal activity; minor signs or symptoms of disease. 80   - Normal activity with effort; some signs or symptoms of disease. 32   - Cares for self; unable to carry on normal activity or to do active work. 60   - Requires occasional assistance, but is able to care for most of his personal needs. 50   - Requires considerable assistance and frequent medical care. 5   - Disabled; requires special care and assistance. 84   - Severely disabled; hospital admission is indicated although death not imminent. 34   -  Very sick; hospital admission necessary; active supportive treatment necessary. 10   - Moribund; fatal processes progressing rapidly. 0     - Dead  Karnofsky DA, Abelmann Weston, Craver LS and [place] [facility] [phone]) The use of the nitrogen mustards in the palliative treatment of carcinoma: with particular reference to bronchogenic carcinoma Cancer 1 634-56  LABORATORY DATA:  Lab Results  Component Value Date   WBC 7.4 02/26/2014   HGB 12.0 02/26/2014   HCT 34.4* 02/26/2014   MCV 98.9 02/26/2014   PLT 201 02/26/2014   Lab Results  Component Value Date   NA 140 02/26/2014   K 4.9 02/26/2014   CL 104 02/26/2014   CO2 24 02/26/2014   Lab Results  Component Value Date   ALT 19 02/25/2014   AST 24 02/25/2014   ALKPHOS 99 02/25/2014   BILITOT 0.2* 02/25/2014     RADIOGRAPHY: Dg Chest 2 View  02/25/2014   CLINICAL DATA:  63 year old female with right mastectomy 1 week prior.  EXAM: CHEST - 2 VIEW  COMPARISON:  06/24/2013  FINDINGS: Cardiomediastinal silhouette unchanged in size and contour.  Unchanged position of right IJ port catheter with the tip terminating in the superior vena cava.  Surgical changes of right mastectomy with surgical clips overlying right hemi thorax.  As was mentioned on prior plain film, pulmonary nodules are difficult to resolve with plain film imaging. These are better characterized on prior CT. No confluent airspace disease, pneumothorax, or pleural effusion.  No displaced fracture.  IMPRESSION: No radiographic evidence of acute cardiopulmonary disease.  Unchanged position of right IJ port catheter.  Interval surgical changes of right mastectomy.  Signed,  Dulcy Fanny. Earleen Newport, DO  [facility]  [facility]   Electronically Signed   By: Corrie Mckusick D.O.   On: 02/25/2014 19:56   Ct Head Wo Contrast  02/25/2014   CLINICAL DATA:  63 year old female with right mastectomy 1 week prior.  EXAM: CT HEAD WITHOUT CONTRAST  TECHNIQUE:  Contiguous axial images were obtained from the base of the skull through the vertex without intravenous contrast.  COMPARISON:  MRI 07/15/2013  FINDINGS: Unremarkable appearance of the calvarium with no displaced fracture or aggressive lesion.  Trace fluid within the right mastoid air cells. Left mastoid air cells are clear.  No significant paranasal sinus disease.  Unremarkable appearance of the bilateral orbits.  Geographic regions of hypodensity within the white matter the bilateral hemispheres. On the right this is predominantly temporoparietal, though it extends into the occipital lobe on the right and there is a focal region in the right frontal white matter. This edema contributes to mass effect and subtle midline shift measuring approximately 11 mm at the foramen of Monro.  There is a mixed density rounded lesion measuring 13 mm within the right parietal lobe (image 17), as well as a smaller hyperdensity in the right occipital lobe (image 16.)  To a lesser degree there is confluent hypodensity within the left posterior frontal lobe white matter as well as the left high parietal white matter.  Focal hyperdensity near the anterior limb of the right internal capsule (image 13).  Heterogeneous focus in the right cerebellar hemisphere (image 7).  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 The right-sided edema is worse, and contributes to 1 cm of right to left midline shift at the foramen of Monro. Further evaluation with contrast MRI is recommended.  These results were called by telephone at the time of interpretation on 02/25/2014 at 8:30 pm to Dr. Davonna Belling , who verbally acknowledged these results.  Signed,  Dulcy Fanny. Earleen Newport, DO  [facility]  [facility]   Electronically Signed   By: Corrie Mckusick D.O.   On: 02/25/2014 20:32   Mr Jeri Cos ZO Contrast  02/26/2014    CLINICAL DATA:  Brain metastases.  Stage IV breast cancer.  EXAM: MRI HEAD WITHOUT AND WITH CONTRAST  TECHNIQUE: Multiplanar, multiecho pulse sequences of the brain and surrounding structures were obtained without and with intravenous contrast.  CONTRAST:  18mL MULTIHANCE GADOBENATE DIMEGLUMINE 529 MG/ML IV SOLN  COMPARISON:  CT head without contrast 02/25/2014. MRI brain 07/15/2013.  FINDINGS: Multiple enhancing mass lesions are present bilaterally, more prominent right than left. Peripheral restricted diffusion is evident within multiple lesions.  A right cerebellar lesion measures 16 x 18 mm with significant surrounding edema.  A 5 mm left temporal lobe lesion is present on image 11. Bilateral occipital lobe lesions are present on image 12. The lesion on the left measures 6.5 x 8.0 mm lesion on the right measures 3 mm. The posterior right temporal lobe lesion on image 13 measures 15 x 12 mm. A posterior left frontal lobe lesion measures 18 x 21 x 18 mm. A right parietal lesion on image 15 measures 6 mm. Additional lesions in the right frontal and right parietal lobe are contained within an an area of confluent T2 hyperintensity in the right frontal and parietal lobe. A more anterior right frontal lobe lesion measures 6 mm on image 19. A high posterior left frontal lobe lesion measures 5.5 mm with surrounding edema. A more inferior left frontal lobe lesion measures 5.5 mm on image 21. And 6.5 mm lesion is present in the left parietal lobe. A peripheral smaller left parietal lesion is present on image 19. A punctate lesion is present within the left lentiform nucleus.  The vasogenic edema results in significant midline shift from the left to the right measuring 10 mm at the foramen of Monro. The basal cisterns are intact.  Flow is present in the major intracranial arteries. The globes and orbits are intact. The paranasal sinuses are clear. There is some fluid in the mastoid air cells bilaterally, left greater than  right.  IMPRESSION: 1. Multiple enhancing  mass lesions scattered throughout the brain. The largest concentration is in the posterior right frontal and right parietal lobe. 2. The largest lesion is in the posterior right frontal lobe, measuring 18 x 21 x 18 mm. 3. A more lateral right occipital lesion measures 17 x 15 x 12 mm. 4. High posterior left frontal lobe measuring 5.5 mm. 5. 18 mm right cerebellar lesion. 6. Significant midline shift measures 5 mm at the foramen of Monro.   Electronically Signed   By: Lawrence Santiago M.D.   On: 02/26/2014 15:38   Mr Breast Bilateral W Wo Contrast  02/02/2014   CLINICAL DATA:  63 year old with current history of metastatic right breast cancer. Patient had a core needle biopsy of a large mass in the outer right breast in August, 2014, pathology invasive ductal carcinoma. Positive right axillary lymph node biopsy at that time. Benign left breast biopsy at that time. Patient has undergone neoadjuvant chemotherapy since that time. MR requested to evaluate response to chemotherapy.  LABS:  Not applicable.  EXAM: BILATERAL BREAST MRI WITH AND WITHOUT CONTRAST  TECHNIQUE: Multiplanar, multisequence MR images of both breasts were obtained prior to and following the intravenous administration of 11 ml of MultiHance.  THREE-DIMENSIONAL MR IMAGE RENDERING ON INDEPENDENT WORKSTATION:  Three-dimensional MR images were rendered by post-processing of the original MR data on an independent workstation. The three-dimensional MR images were interpreted, and findings are reported in the following complete MRI report for this study. Three dimensional images were evaluated at the independent DynaCad workstation.  COMPARISON:  Bilateral breast MRI 12/27/2012. Mammography 01/30/2014, 12/23/2012, 12/18/2012. Bilateral breast ultrasound 12/18/2012.  FINDINGS: Breast composition: c.  Heterogeneous fibroglandular tissue.  Background parenchymal enhancement: Minimal.  Right breast: Since the prior MRI  12/27/2012, decrease in overall size of the mass involving the outer right breast, predominately the upper-outer quadrant, with evidence of further necrosis. The mass currently measures approximately 3.8 x 3.6 x 4.7 cm (previously 4.6 x 4.8 x 4.7 cm). However, there is now evidence of invasion of the right pectoralis major muscle which was not present previously. An enhancing subcutaneous nodule in the outer subareolar right breast with washout kinetics is new since the prior examination and measures approximately 0.5 cm.  Left breast: No mass or abnormal enhancement.  Lymph nodes: Previously identified right axillary lymphadenopathy has shown significant interval improvement since the prior MRI. A single morphologically abnormal lymph node with thickened cortex remains in the axillary tail of the right breast measuring approximately 8 mm (previously approximately 2.3 cm); this was the lymph node that was biopsied at the time of diagnosis. No new or enlarging lymphadenopathy elsewhere.  Ancillary findings: Previously identified lung metastases are no longer visible on MRI.  IMPRESSION: 1. Mixed response to chemotherapy. The dominant mass in the upper outer right breast has decreased in size (measurements above) and has undergone further necrosis. The previously identified right axillary lymphadenopathy has significantly improved. However, there is now evidence of right pectoralis muscle involvement by tumor and involvement of the skin overlying the outer right breast. There is a new suspicious 5 mm enhancing mass in the outer subareolar right breast. 2. No evidence of malignancy involving the left breast. 3. Previously identified lung metastases are no longer visible on MRI. 4. No new or enlarging lymphadenopathy.  RECOMMENDATION: Treatment plan.  BI-RADS CATEGORY  6: Known biopsy-proven malignancy.   Electronically Signed   By: Evangeline Dakin M.D.   On: 02/02/2014 08:47      IMPRESSION: This very nice  patient  has at least 17 brain metastases from metastatic breast cancer. She's not a candidate for surgical resection or stereotactic radiosurgery given the extent of intracranial involvement and would certainly benefit from whole brain radiotherapy.  PLAN:Today, I talked to the patient and family about the findings and work-up thus far.  We discussed the natural history of brain metastases and general treatment, highlighting the role or radiotherapy in the management.  We discussed the available radiation techniques, and focused on the details of logistics and delivery.  We reviewed the anticipated acute and late sequelae associated with radiation in this setting.  The patient was encouraged to ask questions that I answered to the best of my ability.  I filled out a patient counseling form during our discussion including treatment diagrams.  We retained a copy for our records.  The patient would like to proceed with radiation and will be scheduled for CT simulation.  I spent 60 minutes minutes face to face with the patient and more than 50% of that time was spent in counseling and/or coordination of care.    ------------------------------------------------  Sheral Apley. Tammi Klippel, M.D.

## 2014-02-27 NOTE — Progress Notes (Signed)
[facility] Dept Therapy Treatment Record Phone [phone]   Radiation Therapy was administered to Carrie Berry on: 02/27/2014  3:53 PM and was treatment # 1 out of a planned course of 14 treatments.

## 2014-02-27 NOTE — Progress Notes (Signed)
  Radiation Oncology         [phone] ________________________________  Name: Carrie Berry  MRN: [id number]  Date: 02/27/2014  DOB: 1950/07/22  Inpatient  SIMULATION AND TREATMENT PLANNING NOTE    ICD-9-CM ICD-10-CM  1. Brain metastases 198.3 C79.31    DIAGNOSIS:  63 year old woman with at least 1 brain metastases from metastatic breast cancer.  NARRATIVE:  The patient was brought to the [place].  Identity was confirmed.  All relevant records and images related to the planned course of therapy were reviewed.  The patient freely provided informed written consent to proceed with treatment after reviewing the details related to the planned course of therapy. The consent form was witnessed and verified by the simulation staff.  Then, the patient was set-up in a stable reproducible  supine position for radiation therapy.  CT images were obtained.  Surface markings were placed.  The CT images were loaded into the planning software.  Then the target and avoidance structures were contoured.  Treatment planning then occurred.  The radiation prescription was entered and confirmed.  Then, I designed and supervised the construction of a total of 3 medically necessary complex treatment devices including 2 multileaf collimators in a thermoplastic facemask for positioning..  I have requested : Isodose Plan.  I have ordered:Nutrition Consult  PLAN:  The patient will receive 35 Gy in 14 fraction.  ________________________________  Sheral Apley Tammi Klippel, M.D.

## 2014-02-27 NOTE — Progress Notes (Signed)
Patient ID: Carrie Berry, female   DOB: Apr 16, 1951, 63 y.o.   MRN: [id number] [facility] PROGRESS NOTE  Carrie Berry ZHG:[id number] DOB: [birth year] DOA: 02/25/2014 PCP: [name], MD  Brief narrative: 63 y.o. 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 Patient presented 02/25/2014 with complaints of severe pounding headache in the back and on the left side associated with nausea. Patient also reported decreased fine motor skills. On admission, CT head and MRI brain are both significant for multiple brain metastases, the largest concentration in the posterior right frontal and right parietal lobe. She also has a right occipital lesion, left frontal lobe lesion, cerebellar lesion and midline shift at the foramen of [place]. She is supposed to receive RT (Rad onc consulted).  Assessment/Plan:    Principal Problem: Headache / brain metastases and vasogenic edema / midline shift  Headaches likely due to brain metastases, vasogenic edema and midline shift.  No reported seizures.   Patient will start RT per Dr. Tammi Klippel recommendations.  Continue decadron 8 mg IV every 6 hours. Continue keppra for seizure prophylaxis.   Neuro checks per unit protocol.  Continue supportive care for nausea with antiemetics scheduled and as needed, IV fluids Active Problems: History of breast cancer with lung and liver metastases  Appreciate Dr. [name] following and his recommendations.    DVT Prophylaxis   SCD's bilaterally   Code Status: Full.  Family Communication:  plan of care discussed with the patient and her husband at the bedside.  Disposition Plan: Home when stable.    IV Access:   PAC  Peripheral IV line Procedures and diagnostic studies:    Dg Chest 2 View 02/25/2014   No radiographic  evidence of acute cardiopulmonary disease.  Unchanged position of right IJ port catheter.  Interval surgical changes of right mastectomy.    Ct Head Wo Contrast 02/25/2014    Evidence of multiple brain metastases of the supratentorial and infratentorial brain, with associated vasogenic edema. The right-sided edema is worse, and contributes to 1 cm of right to left midline shift at the foramen of [place]. Further evaluation with contrast MRI is recommended.      Mr [name] Contrast 02/26/2014   1. Multiple enhancing mass lesions scattered throughout the brain. The largest concentration is in the posterior right frontal and right parietal lobe. 2. The largest lesion is in the posterior right frontal lobe, measuring 18 x 21 x 18 mm. 3. A more lateral right occipital lesion measures 17 x 15 x 12 mm. 4. High posterior left frontal lobe measuring 5.5 mm. 5. 18 mm right cerebellar lesion. 6. Significant midline shift measures 5 mm at the foramen of [place].     Medical Consultants:   Dr. [name] (Oncology) Dr. [name] (Radiation oncology) Dr. [name] (Surgery) Other Consultants:   None  Anti-Infectives:   None    [name], MD  [facility] Pager [phone]  If 7PM-7AM, please contact night-coverage [email or web address] Password [email or web address] 02/27/2014, 10:51 AM   LOS: 2 days    HPI/Subjective: No acute overnight events. Has headache this am, 6/10 in intensity.  Objective: Filed Vitals:   02/26/14 1437 02/26/14 1820 02/26/14 2300 02/27/14 0728  BP: 114/62 123/67 120/78 122/65  Pulse: 90 82 107 87  Temp: 98.6 F (37 C) 98.8 F (37.1 C) 98.3 F (36.8  C) 98.2 F (36.8 C)  TempSrc: Oral Oral Oral Oral  Resp: 16 16 12 15   Height:      Weight:      SpO2: 97% 100% 100% 98%    Intake/Output Summary (Last 24 hours) at 02/27/14 1051 Last data filed at 02/27/14 0700  Gross per 24 hour  Intake 1127.25 ml  Output   1200 ml  Net -72.75 ml    Exam:   General:  Pt  is alert, follows commands appropriately, not in acute distress  Cardiovascular: Regular rate and rhythm, S1/S2, no murmurs  Respiratory: Clear to auscultation bilaterally, no wheezing, no crackles, no rhonchi  Abdomen: Soft, non tender, non distended, bowel sounds present  Extremities: No edema, pulses DP and PT palpable bilaterally  Neuro: Grossly nonfocal  Data Reviewed: Basic Metabolic Panel:  Recent Labs Lab 02/25/14 1656 02/26/14 0436  NA 141 140  K 4.3 4.9  CL 101 104  CO2 27 24  GLUCOSE 131* 148*  BUN 11 9  CREATININE 0.82 0.76  CALCIUM 9.5 9.4   Liver Function Tests:  Recent Labs Lab 02/25/14 1656  AST 24  ALT 19  ALKPHOS 99  BILITOT 0.2*  PROT 7.2  ALBUMIN 3.6   No results found for this basename: LIPASE, AMYLASE,  in the last 168 hours No results found for this basename: AMMONIA,  in the last 168 hours CBC:  Recent Labs Lab 02/25/14 1656 02/26/14 0436  WBC 8.9 7.4  HGB 12.3 12.0  HCT 35.3* 34.4*  MCV 98.3 98.9  PLT 233 201   Cardiac Enzymes: No results found for this basename: CKTOTAL, CKMB, CKMBINDEX, TROPONINI,  in the last 168 hours BNP: No components found with this basename: POCBNP,  CBG:  Recent Labs Lab 02/25/14 1840  GLUCAP 94    No results found for this or any previous visit (from the past 240 hour(s)).   Scheduled Meds: . dexamethasone  8 mg Intravenous 4 times per day  . docusate sodium  200 mg Oral BID  . fluticasone  2 spray Each Nare Daily  . insulin aspart  0-9 Units Subcutaneous TID WC  . levETIRAcetam  500 mg Intravenous Q12H  . pantoprazole   40 mg Intravenous Q24H  . polyethylene glycol  17 g Oral Daily  . prochlorperazine  10 mg Intravenous TID AC & HS  . traMADol  100 mg Oral 4 times per day  . traZODone  100 mg Oral QHS

## 2014-02-27 NOTE — Clinical Documentation Improvement (Signed)
Possible Conditions:  Cerebral Edema Brain Herniation Other Not able to determine  Supporting Information: CT Head 02/25/14: "IMPRESSION: Evidence of multiple brain metastases of the supratentorial and infratentorial brain, with associated vasogenic edema. The right-sided edema is worse, and contributes to 1 cm of right to left midline shift at the foramen of [place]."   Thank You, Alessandra Grout, RN, BSN, CCDS,Clinical Documentation Specialist:  [phone]  [phone]=[place]- Health Information Management

## 2014-02-27 NOTE — Care Management Note (Signed)
CARE MANAGEMENT NOTE 02/27/2014  Patient:  Carrie Berry, Carrie Berry   Account Number:  [id number]  Date Initiated:  02/27/2014  Documentation initiated by:  Marney Doctor  Subjective/Objective Assessment:   63 yo admitted with brain mets.  Hx of breast CA     Action/Plan:   From home with spouse   Anticipated DC Date:  03/02/2014   Anticipated DC Plan:        DC Planning Services  CM consult      Choice offered to / List presented to:             Status of service:  In process, will continue to follow Medicare Important Message given?   (If response is "NO", the following Medicare IM given date fields will be blank) Date Medicare IM given:   Medicare IM given by:   Date Additional Medicare IM given:   Additional Medicare IM given by:    Discharge Disposition:    Per UR Regulation:  Reviewed for med. necessity/level of care/duration of stay  If discussed at [place] of Stay Meetings, dates discussed:    Comments:  02/27/14 Marney Doctor RN,BSN,NCM Chart being reviewed for CM needs.  CM will continue to follow.

## 2014-02-28 ENCOUNTER — Ambulatory Visit
Admit: 2014-02-28 | Discharge: 2014-02-28 | Disposition: A | Discharge status: Home / Self Care | Payer: BC Managed Care – PPO | Attending: Radiation Oncology | Admitting: Radiation Oncology

## 2014-02-28 ENCOUNTER — Inpatient Hospital Stay (HOSPITAL_COMMUNITY): Payer: BC Managed Care – PPO

## 2014-02-28 ENCOUNTER — Encounter (HOSPITAL_COMMUNITY): Payer: Self-pay | Admitting: Radiology

## 2014-02-28 LAB — GLUCOSE, CAPILLARY
GLUCOSE-CAPILLARY: 158 mg/dL — AB (ref 70–99)
Glucose-Capillary: 111 mg/dL — ABNORMAL HIGH (ref 70–99)
Glucose-Capillary: 124 mg/dL — ABNORMAL HIGH (ref 70–99)

## 2014-02-28 MED ORDER — IOHEXOL 300 MG/ML  SOLN
25.0000 mL | INTRAMUSCULAR | Status: AC
  Administered 2014-02-28 (×2): 25 mL via ORAL

## 2014-02-28 MED ORDER — IOHEXOL 300 MG/ML  SOLN
100.0000 mL | Freq: Once | INTRAMUSCULAR | Status: AC | PRN
  Administered 2014-02-28: 100 mL via INTRAVENOUS

## 2014-02-28 MED ORDER — PANTOPRAZOLE SODIUM 40 MG PO TBEC
40.0000 mg | DELAYED_RELEASE_TABLET | Freq: Every day | ORAL | Status: DC
  Administered 2014-03-01 – 2014-03-10 (×10): 40 mg via ORAL
  Filled 2014-02-28 (×10): qty 1

## 2014-02-28 NOTE — Progress Notes (Signed)
Events and MRI findings noted.  Received first brain XRT treatment yesterday.  Right chest wall wound is clean and intact with staples.  Staples should be able to be removed sometime next week.

## 2014-02-28 NOTE — Progress Notes (Signed)
Pharmacy - Brief Note (IV to PO pantoprazole)  This patient is receiving pantoprazole. Based on criteria approved by the Pharmacy and Therapeutics Committee, this medication is being converted to the equivalent oral dose form. These criteria include:   . The patient is eating (either orally or per tube) and/or has been taking other orally administered medications for at least 24 hours.  . This patient has no evidence of active gastrointestinal bleeding or impaired GI absorption (gastrectomy, short bowel, patient on TNA or NPO).   If you have questions about this conversion, please contact the pharmacy department (ext [phone])  Carrie Berry, [facility] 02/28/2014 12:46 PM

## 2014-02-28 NOTE — Progress Notes (Signed)
Patient ID: Carrie Berry, female   DOB: 05-15-1951, 63 y.o.   MRN: [id number] [facility] PROGRESS NOTE  Carrie Berry ALP:[id number] DOB: 1951/03/27 DOA: 02/25/2014 PCP: [name], MD  Brief narrative: 63 y.o. 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 Patient presented 02/25/2014 with complaints of severe pounding headache in the back and on the left side associated with nausea. Patient also reported decreased fine motor skills. On admission, CT head and MRI brain are both significant for multiple brain metastases, the largest concentration in the posterior right frontal and right parietal lobe. She also has a right occipital lesion, left frontal lobe lesion, cerebellar lesion and midline shift at the foramen of [place]. She is supposed to receive RT (Rad onc consulted).   Assessment/Plan:   Principal Problem:  Headache / brain metastases and vasogenic edema / midline shift  Headaches likely due to brain metastases, vasogenic edema and midline shift. No seizures. Started RT 02/27/2014. Continue as planned per Dr. Tammi Klippel. Continue decadron 8 mg IV every 6 hours. Continue keppra for seizure prophylaxis.  Continue supportive care for nausea with antiemetics scheduled and as needed, IV fluids. Continue pain management efforts for headache. Active Problems:  History of breast cancer with lung and liver metastases  Appreciate Dr. [name] following and his recommendations.  DVT Prophylaxis  SCD's bilaterally. GI prophylaxis  Since pt on decadron, added protonix for GI prophylaxis.  Code Status: Full.  Family Communication: plan of care discussed with the patient and her daughter at the bedside.  Disposition Plan: Home when stable.    IV Access:   PAC  Peripheral IV line Procedures and  diagnostic studies:   Dg Chest 2 View 02/25/2014 No radiographic evidence of acute cardiopulmonary disease. Unchanged position of right IJ port catheter. Interval surgical changes of right mastectomy.  Ct Head Wo Contrast 02/25/2014 Evidence of multiple brain metastases of the supratentorial and infratentorial brain, with associated vasogenic edema. The right-sided edema is worse, and contributes to 1 cm of right to left midline shift at the foramen of [place]. Further evaluation with contrast MRI is recommended.  Mr [name] Contrast 02/26/2014 1. Multiple enhancing mass lesions scattered throughout the brain. The largest concentration is in the posterior right frontal and right parietal lobe. 2. The largest lesion is in the posterior right frontal lobe, measuring 18 x 21 x 18 mm. 3. A more lateral right occipital lesion measures 17 x 15 x 12 mm. 4. High posterior left frontal lobe measuring 5.5 mm. 5. 18 mm right cerebellar lesion. 6. Significant midline shift measures 5 mm at the foramen of [place]. Ct Chest W Contrast 02/28/2014   Interval progression of pulmonary and hepatic metastases Status post right mastectomy without complicating feature.  Trace amount of free pelvic fluid of uncertain etiology.   Ct Abdomen Pelvis W Contrast 02/28/2014  Interval progression of pulmonary and hepatic metastases.  Status post right mastectomy without complicating feature.  Trace amount of free pelvic fluid of uncertain etiology.    Medical Consultants:   Dr. Lurline Berry (Oncology)  Dr. Tyler Berry (Radiation oncology)  Dr. Jackolyn Berry (Surgery) Other Consultants:   None  Anti-Infectives:   None    Carrie Lenz, MD  [facility] Pager [phone]  If 7PM-7AM, please contact night-coverage [email or web address] Password [email or web address] 02/28/2014, 1:26 PM   LOS: 3 days  HPI/Subjective: No acute overnight events.  Objective: Filed Vitals:   02/26/14 2300 02/27/14 0728 02/27/14 1150 02/28/14 0540   BP: 120/78 122/65  129/52  Pulse: 107 87  79  Temp: 98.3 F (36.8 C) 98.2 F (36.8 C) 98.2 F (36.8 C) 98.2 F (36.8 C)  TempSrc: Oral Oral Axillary Oral  Resp: 12 15  16   Height:      Weight:      SpO2: 100% 98%  99%    Intake/Output Summary (Last 24 hours) at 02/28/14 1326 Last data filed at 02/28/14 1000  Gross per 24 hour  Intake      0 ml  Output   2100 ml  Net  -2100 ml    Exam:   General:  Pt is more alert this am, no distress  Cardiovascular: Regular rate and rhythm, S1/S2, no murmurs  Respiratory: Clear to auscultation bilaterally, no wheezing, no crackles, no rhonchi  Abdomen: Soft, non tender, non distended, bowel sounds present  Extremities: pulses DP and PT palpable bilaterally  Neuro: No focal deficits.  Data Reviewed: Basic Metabolic Panel:  Recent Labs Lab 02/25/14 1656 02/26/14 0436  NA 141 140  K 4.3 4.9  CL 101 104  CO2 27 24  GLUCOSE 131* 148*  BUN 11 9  CREATININE 0.82 0.76  CALCIUM 9.5 9.4   Liver Function Tests:  Recent Labs Lab 02/25/14 1656  AST 24  ALT 19  ALKPHOS 99  BILITOT 0.2*  PROT 7.2  ALBUMIN 3.6   No results found for this basename: LIPASE, AMYLASE,  in the last 168 hours No results found for this basename: AMMONIA,  in the last 168 hours CBC:  Recent Labs Lab 02/25/14 1656 02/26/14 0436  WBC 8.9 7.4  HGB 12.3 12.0  HCT 35.3* 34.4*  MCV 98.3 98.9  PLT 233 201   Cardiac Enzymes: No results found for this basename: CKTOTAL, CKMB, CKMBINDEX, TROPONINI,  in the last 168 hours BNP: No components found with this basename: POCBNP,  CBG:  Recent Labs Lab 02/25/14 1840 02/27/14 1145 02/27/14 1624 02/28/14 0750 02/28/14 1151  GLUCAP 94 122* 119* 111* 158*    No results found for this or any previous visit (from the past 240 hour(s)).   Scheduled Meds: . dexamethasone  8 mg Intravenous 4 times per day  . docusate sodium  200 mg Oral BID  . fluticasone  2 spray Each Nare Daily  . insulin  aspart  0-9 Units Subcutaneous TID WC  . levETIRAcetam  500 mg Intravenous Q12H  . olopatadine  1 drop Both Eyes BID  . [START ON 03/01/2014] pantoprazole  40 mg Oral Daily  . polyethylene glycol  17 g Oral Daily  . prochlorperazine  10 mg Intravenous TID AC & HS  . sodium chloride  3 mL Intravenous Q12H  . traMADol  100 mg Oral 4 times per day  . traZODone  100 mg Oral QHS   Continuous Infusions: . sodium chloride 125 mL/hr at 02/28/14 0400

## 2014-02-28 NOTE — Progress Notes (Signed)
[facility] Therapy Treatment Record Phone [phone]   Radiation Therapy was administered to Carrie Berry on: 02/28/2014  1:12 PM and was treatment # 2 out of a planned course of 14 treatments.

## 2014-02-28 NOTE — Progress Notes (Signed)
  Radiation Oncology         [phone] ________________________________  Name: Carrie Berry  MRN: [id number]  Date: 02/27/2014  DOB: 10/09/1950    Simulation Verification Note    ICD-9-CM ICD-10-CM  1. Brain metastases 198.3 C79.31    Status: inpatient  NARRATIVE: The patient was brought to the treatment unit and placed in the planned treatment position. The clinical setup was verified. Then port films were obtained and uploaded to the radiation oncology medical record software.  The treatment beams were carefully compared against the planned radiation fields. The position location and shape of the radiation fields was reviewed. They targeted volume of tissue appears to be appropriately covered by the radiation beams. Organs at risk appear to be excluded as planned.  Based on my personal review, I approved the simulation verification. The patient's treatment will proceed as planned.  ------------------------------------------------  Sheral Apley Tammi Klippel, M.D.

## 2014-03-01 LAB — GLUCOSE, CAPILLARY
GLUCOSE-CAPILLARY: 128 mg/dL — AB (ref 70–99)
GLUCOSE-CAPILLARY: 145 mg/dL — AB (ref 70–99)
GLUCOSE-CAPILLARY: 118 mg/dL — AB (ref 70–99)

## 2014-03-01 MED ORDER — POLYETHYLENE GLYCOL 3350 17 G PO PACK
17.0000 g | PACK | Freq: Two times a day (BID) | ORAL | Status: DC
  Administered 2014-03-01 – 2014-03-02 (×3): 17 g via ORAL
  Filled 2014-03-01 (×8): qty 1

## 2014-03-01 MED ORDER — BISACODYL 10 MG RE SUPP: 10.0000 mg | Freq: Every day | RECTAL | Status: DC | PRN

## 2014-03-01 NOTE — Progress Notes (Signed)
Patient ID: Carrie Berry, female   DOB: 27-Jun-1950, 63 y.o.   MRN: [id number] [facility] PROGRESS NOTE  Carrie Berry FIE:[id number] DOB: 1950/12/14 DOA: 02/25/2014 PCP: [name], MD  Brief narrative: 63 y.o. 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 Patient presented 02/25/2014 with complaints of severe pounding headache in the back and on the left side associated with nausea. Patient also reported decreased fine motor skills. On admission, CT head and MRI brain are both significant for multiple brain metastases, the largest concentration in the posterior right frontal and right parietal lobe. She also has a right occipital lesion, left frontal lobe lesion, cerebellar lesion and midline shift at the foramen of [place]. Pt started RT on 02/27/2014.  Assessment/Plan:   Principal Problem:  Headache / brain metastases and vasogenic edema / midline shift  Headaches likely due to brain metastases, vasogenic edema and midline shift. No seizures.  Started RT 02/27/2014. Continue as planned per Dr. Tammi Klippel.  Continue decadron 8 mg IV every 6 hours. Continue keppra for seizure prophylaxis.  Continue supportive care for nausea with antiemetics. Diet as tolerated. Continue pain management efforts for headache. Active Problems:  History of breast cancer with lung and liver metastases  Appreciate oncology following.  DVT Prophylaxis  SCD's bilaterally. GI prophylaxis   Since pt on decadron, added protonix for GI prophylaxis.   Code Status: Full.  Family Communication: plan of care discussed with the patient and her daughter at the bedside.  Disposition Plan: Home when stable.    IV Access:   PAC  Peripheral IV line Procedures and diagnostic studies:   Dg Chest 2 View 02/25/2014 No  radiographic evidence of acute cardiopulmonary disease. Unchanged position of right IJ port catheter. Interval surgical changes of right mastectomy.  Ct Head Wo Contrast 02/25/2014 Evidence of multiple brain metastases of the supratentorial and infratentorial brain, with associated vasogenic edema. The right-sided edema is worse, and contributes to 1 cm of right to left midline shift at the foramen of [place]. Further evaluation with contrast MRI is recommended.  Mr [name] Contrast 02/26/2014 1. Multiple enhancing mass lesions scattered throughout the brain. The largest concentration is in the posterior right frontal and right parietal lobe. 2. The largest lesion is in the posterior right frontal lobe, measuring 18 x 21 x 18 mm. 3. A more lateral right occipital lesion measures 17 x 15 x 12 mm. 4. High posterior left frontal lobe measuring 5.5 mm. 5. 18 mm right cerebellar lesion. 6. Significant midline shift measures 5 mm at the foramen of [place].  Ct Chest W Contrast 02/28/2014 Interval progression of pulmonary and hepatic metastases Status post right mastectomy without complicating feature. Trace amount of free pelvic fluid of uncertain etiology.  Ct Abdomen Pelvis W Contrast 02/28/2014 Interval progression of pulmonary and hepatic metastases. Status post right mastectomy without complicating feature. Trace amount of free pelvic fluid of uncertain etiology.  Medical Consultants:   Dr. [name] (Oncology)  Dr. [name] (Radiation oncology)  Dr. [name] (Surgery) Other Consultants:   None  Anti-Infectives:   None    [name], MD  [facility] Pager [phone]  If 7PM-7AM, please contact night-coverage [email or web address] Password [facility] 03/01/2014, 11:37 AM   LOS: 4 days    HPI/Subjective: No acute overnight events.  Objective: Filed Vitals:   02/28/14 0540  02/28/14 1300 02/28/14 2048 03/01/14 0500  BP: 129/52 127/50 125/64 136/66  Pulse: 79 78 72 65  Temp:  98.2 F (36.8 C) 98.2 F (36.8 C) 98.4 F (36.9 C) 98.1 F (36.7 C)  TempSrc: Oral Oral Oral Oral  Resp: 16 18 16 16   Height:      Weight:      SpO2: 99% 98% 98% 98%    Intake/Output Summary (Last 24 hours) at 03/01/14 1137 Last data filed at 03/01/14 1059  Gross per 24 hour  Intake   1180 ml  Output   2100 ml  Net   -920 ml    Exam:   General:  not in acute distress  Cardiovascular: Regular rate and rhythm, S1/S2 appreciated   Respiratory: Clear to auscultation bilaterally, no wheezing, no crackles, no rhonchi  Abdomen: Soft, non tender, non distended, bowel sounds present  Extremities: pulses DP and PT palpable bilaterally  Neuro: Grossly nonfocal  Data Reviewed: Basic Metabolic Panel:  Recent Labs Lab 02/25/14 1656 02/26/14 0436  NA 141 140  K 4.3 4.9  CL 101 104  CO2 27 24  GLUCOSE 131* 148*  BUN 11 9  CREATININE 0.82 0.76  CALCIUM 9.5 9.4   Liver Function Tests:  Recent Labs Lab 02/25/14 1656  AST 24  ALT 19  ALKPHOS 99  BILITOT 0.2*  PROT 7.2  ALBUMIN 3.6   No results found for this basename: LIPASE, AMYLASE,  in the last 168 hours No results found for this basename: AMMONIA,  in the last 168 hours CBC:  Recent Labs Lab 02/25/14 1656 02/26/14 0436  WBC 8.9 7.4  HGB 12.3 12.0  HCT 35.3* 34.4*  MCV 98.3 98.9  PLT 233 201   Cardiac Enzymes: No results found for this basename: CKTOTAL, CKMB, CKMBINDEX, TROPONINI,  in the last 168 hours BNP: No components found with this basename: POCBNP,  CBG:  Recent Labs Lab 02/27/14 1624 02/28/14 0750 02/28/14 1151 02/28/14 1829 03/01/14 0731  GLUCAP 119* 111* 158* 124* 128*    No results found for this or any previous visit (from the past 240 hour(s)).   Scheduled Meds: . dexamethasone  8 mg Intravenous 4 times per day  . docusate sodium  200 mg Oral BID  . fluticasone  2 spray Each Nare Daily  . insulin aspart  0-9 Units Subcutaneous TID WC  . levETIRAcetam  500 mg Intravenous  Q12H  . olopatadine  1 drop Both Eyes BID  . pantoprazole  40 mg Oral Daily  . polyethylene glycol  17 g Oral BID  . prochlorperazine  10 mg Intravenous TID AC & HS  . traMADol  100 mg Oral 4 times per day  . traZODone  100 mg Oral QHS   Continuous Infusions: . sodium chloride 125 mL/hr at 03/01/14 0400

## 2014-03-01 NOTE — Progress Notes (Signed)
SUBJECTIVE: Patient is currently receiving radiation therapy and denies any headaches today. Discomfort in the back  OBJECTIVE PHYSICAL EXAMINATION: ECOG PERFORMANCE STATUS: 3 - Symptomatic, >50% confined to bed  Filed Vitals:   03/01/14 0500  BP: 136/66  Pulse: 65  Temp: 98.1 F (36.7 C)  Resp: 16   Filed Weights   02/25/14 2236  Weight: 122 lb 9.2 oz (55.6 kg)    GENERAL:alert, no distress and comfortable OROPHARYNX:no exudate, no erythema and lips, buccal mucosa, and tongue normal  LUNGS: clear to auscultation and percussion with normal breathing effort HEART: regular rate & rhythm and no murmurs and no lower extremity edema ABDOMEN:abdomen soft, non-tender and normal bowel sounds Musculoskeletal:no cyanosis of digits and no clubbing  NEURO: alert & oriented x 3   LABORATORY DATA:  I have reviewed the data as listed @LASTCHEMISTRY @  Lab Results  Component Value Date   WBC 7.4 02/26/2014   HGB 12.0 02/26/2014   HCT 34.4* 02/26/2014   MCV 98.9 02/26/2014   PLT 201 02/26/2014   NEUTROABS 6.6* 02/02/2014    ASSESSMENT AND PLAN:  1. brain metastases from metastatic breast cancer: Currently receiving radiation therapy. 2. reviewed the results of the CT of the chest abdomen and pelvis. I discussed with her that there were couple of new lung nodules. One of the liver metastases have increased in size the other one had shrunk in size the remainder of the nodules in the liver are stable. Essentially she has progression of disease. Patient's husband is inquiring what other options might be available. I discussed with them that Dr. [name] will be able to answer that specific questions and treatment options. Appreciate the hospitalist help as well as radiation oncologist help in her management Dr.Magrinat would assume her care from hemo-onc standpoint tomorrow.

## 2014-03-02 ENCOUNTER — Ambulatory Visit
Admit: 2014-03-02 | Discharge: 2014-03-02 | Disposition: A | Discharge status: Home / Self Care | Payer: BC Managed Care – PPO | Attending: Radiation Oncology | Admitting: Radiation Oncology

## 2014-03-02 ENCOUNTER — Other Ambulatory Visit: Payer: BC Managed Care – PPO

## 2014-03-02 LAB — BASIC METABOLIC PANEL
ANION GAP: 10 (ref 5–15)
BUN: 15 mg/dL (ref 6–23)
CALCIUM: 8.6 mg/dL (ref 8.4–10.5)
CO2: 27 mEq/L (ref 19–32)
Chloride: 100 mEq/L (ref 96–112)
Creatinine, Ser: 0.57 mg/dL (ref 0.50–1.10)
GFR calc Af Amer: >90 mL/min (ref >90)
GFR calc non Af Amer: >90 mL/min (ref >90)
GLUCOSE: 131 mg/dL — AB (ref 70–99)
POTASSIUM: 4.1 meq/L (ref 3.7–5.3)
SODIUM: 137 meq/L (ref 137–147)

## 2014-03-02 LAB — GLUCOSE, CAPILLARY
GLUCOSE-CAPILLARY: 140 mg/dL — AB (ref 70–99)
GLUCOSE-CAPILLARY: 182 mg/dL — AB (ref 70–99)
Glucose-Capillary: 96 mg/dL (ref 70–99)

## 2014-03-02 LAB — CBC
HCT: 32.8 % — ABNORMAL LOW (ref 36.0–46.0)
HEMOGLOBIN: 11.3 g/dL — AB (ref 12.0–15.0)
MCH: 34.1 pg — AB (ref 26.0–34.0)
MCHC: 34.5 g/dL (ref 30.0–36.0)
MCV: 99.1 fL (ref 78.0–100.0)
PLATELETS: 146 10*3/uL — AB (ref 150–400)
RBC: 3.31 MIL/uL — AB (ref 3.87–5.11)
RDW: 14.2 % (ref 11.5–15.5)
WBC: 10.6 10*3/uL — ABNORMAL HIGH (ref 4.0–10.5)

## 2014-03-02 MED ORDER — PROCHLORPERAZINE MALEATE 10 MG PO TABS
10.0000 mg | ORAL_TABLET | Freq: Four times a day (QID) | ORAL | Status: DC | PRN
  Filled 2014-03-02: qty 1

## 2014-03-02 MED ORDER — DEXAMETHASONE 4 MG PO TABS
8.0000 mg | ORAL_TABLET | Freq: Two times a day (BID) | ORAL | Status: DC
Start: 2014-03-02 — End: 2014-03-10
  Administered 2014-03-02 – 2014-03-10 (×16): 8 mg via ORAL
  Filled 2014-03-02 (×17): qty 2

## 2014-03-02 MED ORDER — OXYCODONE HCL 5 MG PO TABS: 5.0000 mg | ORAL_TABLET | ORAL | Status: DC | PRN

## 2014-03-02 NOTE — Progress Notes (Signed)
Right chest wall wound looks good.  Staples removed.  Follow up with me in one month.

## 2014-03-02 NOTE — Progress Notes (Signed)
Carrie Berry   DOB:[birth year]   ZT#:[id number]   JAS#:[id number]  Subjective: Carrie Berry is much more alert, tells me her h/a is not "dull", denies nausea, was able to eat a good breakfast and lunch today. Finally had a BM today. Husband in room   Objective: middle aged white woman examined in bed Filed Vitals:   03/02/14 1329  BP: 125/63  Pulse: 81  Temp: 98.3 F (36.8 C)  Resp: 16    Body mass index is 21.37 kg/(m^2).  Intake/Output Summary (Last 24 hours) at 03/02/14 1848 Last data filed at 03/02/14 1329  Gross per 24 hour  Intake    360 ml  Output   2501 ml  Net  -2141 ml     Sclerae unicteric, EOMs full  No peripheral adenopathy  Lungs clear -- auscultated anterolaterally  Heart regular rate and rhythm  Abdomen benign  MSK no findings in R shoulder area, where she c/o discomfort  Neuro nonfocal  Breast exam: deferred  CBG (last 3)   Recent Labs  03/02/14 0809 03/02/14 1148 03/02/14 1718  GLUCAP 140* 182* 96     Labs:  Lab Results  Component Value Date   WBC 10.6* 03/02/2014   HGB 11.3* 03/02/2014   HCT 32.8* 03/02/2014   MCV 99.1 03/02/2014   PLT 146* 03/02/2014   NEUTROABS 6.6* 02/02/2014    @LASTCHEMISTRY @  Urine Studies No results found for this basename: UACOL, UAPR, USPG, UPH, UTP, UGL, UKET, UBIL, UHGB, UNIT, UROB, ULEU, UEPI, UWBC, URBC, UBAC, CAST, CRYS, UCOM, BILUA,  in the last 72 hours  Basic Metabolic Panel:  Recent Labs Lab 02/25/14 1656 02/26/14 0436 03/02/14 0530  NA 141 140 137  K 4.3 4.9 4.1  CL 101 104 100  CO2 27 24 27   GLUCOSE 131* 148* 131*  BUN 11 9 15   CREATININE 0.82 0.76 0.57  CALCIUM 9.5 9.4 8.6   GFR Estimated Creatinine Clearance: 62.5 ml/min (by C-G formula based on Cr of 0.57). Liver Function Tests:  Recent Labs Lab 02/25/14 1656  AST 24  ALT 19  ALKPHOS 99  BILITOT 0.2*  PROT 7.2  ALBUMIN 3.6   No results found for this basename: LIPASE, AMYLASE,  in the last 168 hours No results found for this  basename: AMMONIA,  in the last 168 hours Coagulation profile No results found for this basename: INR, PROTIME,  in the last 168 hours  CBC:  Recent Labs Lab 02/25/14 1656 02/26/14 0436 03/02/14 0530  WBC 8.9 7.4 10.6*  HGB 12.3 12.0 11.3*  HCT 35.3* 34.4* 32.8*  MCV 98.3 98.9 99.1  PLT 233 201 146*   Cardiac Enzymes: No results found for this basename: CKTOTAL, CKMB, CKMBINDEX, TROPONINI,  in the last 168 hours BNP: No components found with this basename: POCBNP,  CBG:  Recent Labs Lab 03/01/14 1134 03/01/14 1701 03/02/14 0809 03/02/14 1148 03/02/14 1718  GLUCAP 145* 118* 140* 182* 96   D-Dimer No results found for this basename: DDIMER,  in the last 72 hours Hgb A1c No results found for this basename: HGBA1C,  in the last 72 hours Lipid Profile No results found for this basename: CHOL, HDL, LDLCALC, TRIG, CHOLHDL, LDLDIRECT,  in the last 72 hours Thyroid function studies No results found for this basename: TSH, T4TOTAL, FREET3, T3FREE, THYROIDAB,  in the last 72 hours Anemia work up No results found for this basename: VITAMINB12, FOLATE, FERRITIN, TIBC, IRON, RETICCTPCT,  in the last 72 hours Microbiology No results found for this  or any previous visit (from the past 240 hour(s)).    Studies:  Dg Chest 2 View  02/25/2014   CLINICAL DATA:  63 year old female with right mastectomy 1 week prior.  EXAM: CHEST - 2 VIEW  COMPARISON:  06/24/2013  FINDINGS: Cardiomediastinal silhouette unchanged in size and contour.  Unchanged position of right IJ port catheter with the tip terminating in the superior vena cava.  Surgical changes of right mastectomy with surgical clips overlying right hemi thorax.  As was mentioned on prior plain film, pulmonary nodules are difficult to resolve with plain film imaging. These are better characterized on prior CT. No confluent airspace disease, pneumothorax, or pleural effusion.  No displaced fracture.  IMPRESSION: No radiographic evidence  of acute cardiopulmonary disease.  Unchanged position of right IJ port catheter.  Interval surgical changes of right mastectomy.  Signed,  Dulcy Fanny. Earleen Newport, DO  [facility]  [facility]   Electronically Signed   By: Corrie Mckusick D.O.   On: 02/25/2014 19:56   Ct Head Wo Contrast  02/25/2014   CLINICAL DATA:  63 year old female with right mastectomy 1 week prior.  EXAM: CT HEAD WITHOUT CONTRAST  TECHNIQUE: Contiguous axial images were obtained from the base of the skull through the vertex without intravenous contrast.  COMPARISON:  MRI 07/15/2013  FINDINGS: Unremarkable appearance of the calvarium with no displaced fracture or aggressive lesion.  Trace fluid within the right mastoid air cells. Left mastoid air cells are clear.  No significant paranasal sinus disease.  Unremarkable appearance of the bilateral orbits.  Geographic regions of hypodensity within the white matter the bilateral hemispheres. On the right this is predominantly temporoparietal, though it extends into the occipital lobe on the right and there is a focal region in the right frontal white matter. This edema contributes to mass effect and subtle midline shift measuring approximately 11 mm at the foramen of Monro.  There is a mixed density rounded lesion measuring 13 mm within the right parietal lobe (image 17), as well as a smaller hyperdensity in the right occipital lobe (image 16.)  To a lesser degree there is confluent hypodensity within the left posterior frontal lobe white matter as well as the left high parietal white matter.  Focal hyperdensity near the anterior limb of the right internal capsule (image 13).  Heterogeneous focus in the right cerebellar hemisphere (image 7).  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 The right-sided edema is worse, and contributes to 1 cm of right to left midline shift at  the foramen of Monro. Further evaluation with contrast MRI is recommended.  These results were called by telephone at the time of interpretation on 02/25/2014 at 8:30 pm to Dr. Davonna Belling , who verbally acknowledged these results.  Signed,  Dulcy Fanny. Earleen Newport, DO  [facility]  [facility]   Electronically Signed   By: Corrie Mckusick D.O.   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 Status post palliative mastectomy 02/17/2014. Back pain on the left with nausea.  EXAM: CT CHEST, ABDOMEN, AND PELVIS WITH CONTRAST  TECHNIQUE: Multidetector CT imaging of the chest, abdomen and pelvis was performed following the standard protocol during bolus administration of intravenous contrast.  CONTRAST:  152mL OMNIPAQUE IOHEXOL 300 MG/ML  SOLN  COMPARISON:  Chest CT and abdomen scan 07/15/2013. PET CT scan 01/07/2013.  FINDINGS: CT CHEST FINDINGS  Since the prior chest CT, the patient has undergone right mastectomy. There is a small amount of gas the soft tissues from the procedure and surgical staples are in place. A small amount of simple appearing fluid is also identified.  A right axillary lymph node measuring 0.6 cm is seen. There is no fatty hilum within the node. The node is seen on the prior study, but had a clearly visible fatty hilum. Tiny right paratracheal node seen on the prior examination is unchanged. No pathologically enlarged lymph nodes are seen in the hila or mediastinum. There is no pleural or pericardial effusion. Heart size is upper normal.  There has been progression of pulmonary metastases. A new left lower lobe pulmonary nodule on image 30 measures 0.8 x 0.6 cm. A new left upper lobe nodule on image 22 measures 0.4 cm. Right upper lobe pulmonary nodule which had measured 0.7 x 0.5 cm today measures 0.8 x 0.6 cm on image 20.  Tiny subpleural nodule along the major fissure on the left seen on the prior study is unchanged on image 16. No focal bony abnormality is identified.  CT ABDOMEN AND PELVIS FINDINGS  Previously seen 0.9 cm low attenuating lesion the right hepatic lobe today measures 0.5 cm. Lesion in the left hepatic lobe which had measured 0.6 cm today measures 1.5 cm on image 58. Additional liver lesions do not appear markedly changed. No new liver lesion is identified.  The adrenal glands, spleen, gallbladder, biliary tree and pancreas appear normal. Mild prominence of the left renal pelvis seen on the prior study appear slightly increased. Abnormal axis orientation of the right kidney is noted. Small amount of free pelvic fluid is present. Urinary bladder, uterus adnexa are unremarkable. The stomach and small and large bowel appear normal. No lymphadenopathy is identified. No sclerotic lesion is seen.  IMPRESSION: Interval progression of pulmonary and hepatic metastases as detailed above.  Status post right mastectomy without complicating feature.  Trace amount of free pelvic fluid of uncertain etiology.   Electronically Signed   By: Inge Rise M.D.   On: 02/28/2014 12:20   Mr Jeri Cos DX Contrast  02/26/2014   CLINICAL DATA:  Brain metastases.  Stage IV breast cancer.  EXAM: MRI HEAD WITHOUT AND WITH CONTRAST  TECHNIQUE: Multiplanar, multiecho pulse sequences of the brain and surrounding structures were obtained without and with intravenous contrast.  CONTRAST:  35mL MULTIHANCE GADOBENATE DIMEGLUMINE 529 MG/ML IV SOLN  COMPARISON:  CT head without contrast 02/25/2014. MRI brain 07/15/2013.  FINDINGS: Multiple enhancing mass lesions are present bilaterally, more prominent right than left. Peripheral restricted diffusion is evident within multiple lesions.  A right cerebellar lesion measures 16 x 18 mm with significant surrounding edema.  A 5 mm left temporal lobe lesion is present on image 11. Bilateral occipital lobe  lesions are present on image 12. The lesion on the left measures 6.5 x 8.0 mm lesion on the right measures 3 mm. The posterior right temporal lobe lesion on image 13 measures 15 x 12 mm. A posterior left frontal lobe lesion measures 18 x 21 x 18 mm. A right parietal lesion on image 15 measures 6 mm. Additional lesions in the right frontal and right parietal lobe are contained within an an area of confluent T2 hyperintensity in the right frontal and parietal lobe. A more anterior right frontal lobe lesion measures 6 mm on image 19. A  high posterior left frontal lobe lesion measures 5.5 mm with surrounding edema. A more inferior left frontal lobe lesion measures 5.5 mm on image 21. And 6.5 mm lesion is present in the left parietal lobe. A peripheral smaller left parietal lesion is present on image 19. A punctate lesion is present within the left lentiform nucleus.  The vasogenic edema results in significant midline shift from the left to the right measuring 10 mm at the foramen of Monro. The basal cisterns are intact.  Flow is present in the major intracranial arteries. The globes and orbits are intact. The paranasal sinuses are clear. There is some fluid in the mastoid air cells bilaterally, left greater than right.  IMPRESSION: 1. Multiple enhancing mass lesions scattered throughout the brain. The largest concentration is in the posterior right frontal and right parietal lobe. 2. The largest lesion is in the posterior right frontal lobe, measuring 18 x 21 x 18 mm. 3. A more lateral right occipital lesion measures 17 x 15 x 12 mm. 4. High posterior left frontal lobe measuring 5.5 mm. 5. 18 mm right cerebellar lesion. 6. Significant midline shift measures 5 mm at the foramen of Monro.   Electronically Signed   By: Lawrence Santiago M.D.   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  Status post palliative mastectomy 02/17/2014. Back pain on the left with nausea.  EXAM: CT CHEST, ABDOMEN, AND PELVIS WITH CONTRAST  TECHNIQUE: Multidetector CT imaging of the chest, abdomen and pelvis was performed following the standard protocol during bolus administration of intravenous contrast.  CONTRAST:  125mL OMNIPAQUE IOHEXOL 300 MG/ML  SOLN  COMPARISON:  Chest CT and abdomen scan 07/15/2013. PET CT scan 01/07/2013.  FINDINGS: CT CHEST FINDINGS  Since the prior chest CT, the patient has undergone right mastectomy. There is a small amount of gas the soft tissues from the procedure and surgical staples are in place. A small amount of simple appearing fluid is also identified.  A right axillary lymph node measuring 0.6 cm is seen. There is no fatty hilum within the node. The node is seen on the prior study, but had a clearly visible fatty hilum. Tiny right paratracheal node seen on the prior examination is unchanged. No pathologically enlarged lymph nodes are seen in the hila or mediastinum. There is no pleural or pericardial effusion. Heart size is upper normal.  There has been progression of pulmonary metastases. A new left lower lobe pulmonary nodule on image 30 measures 0.8 x 0.6 cm. A new left upper lobe nodule on image 22 measures 0.4 cm. Right upper lobe pulmonary nodule which had measured 0.7 x 0.5 cm today measures 0.8 x 0.6 cm on image 20. Tiny subpleural nodule along the major fissure on the left seen on the prior study is unchanged on image 16. No focal bony abnormality is identified.  CT ABDOMEN AND PELVIS FINDINGS  Previously seen 0.9 cm low attenuating lesion the right hepatic lobe today measures 0.5 cm. Lesion in the left hepatic lobe which had measured 0.6 cm today measures 1.5 cm on image 58. Additional liver lesions do not appear markedly changed. No new liver lesion is identified.  The adrenal glands, spleen, gallbladder, biliary tree and pancreas appear normal. Mild prominence of the left  renal pelvis seen on the prior study appear slightly increased. Abnormal axis orientation of the  right kidney is noted. Small amount of free pelvic fluid is present. Urinary bladder, uterus adnexa are unremarkable. The stomach and small and large bowel appear normal. No lymphadenopathy is identified. No sclerotic lesion is seen.  IMPRESSION: Interval progression of pulmonary and hepatic metastases as detailed above.  Status post right mastectomy without complicating feature.  Trace amount of free pelvic fluid of uncertain etiology.   Electronically Signed   By: Inge Rise M.D.   On: 02/28/2014 12:20   Assessment: 62 y.o. [name], [place] woman with a history of breast cancer metastatic at presentation August 2014, with lung and liver involvement, treated as summarized below, admitted 02/25/2014 with nausea, vomiting, headaches, and found to have multiple bilateral brain metastases.  TREATMENT HISTORY  (0) status post right breast upper outer quadrant and right axillary lymph node biopsy 12/23/2012 for a clinical T3 N1 M1, stage IV invasive ductal carcinoma, grade 3, triple negative, with an MIB-1 of 86%.  (1) right liver lobe biopsy 01/21/2013 confirms metastatic adenocarcinoma; scans show involvement of the liver, lungs, and likely bone.  (2) enrolled in [facility] study [id number] (docetaxel + oral gamma secretase inibitor [id number]), received one cycle starting 02/04/2013 but withdrew because of poor tolerance despite treatment interruptions and decreased dosing of the oral component  (3) chest CT in early November 2014 was compared with studies in August 2014 and showed interval progression of metastatic breast cancer, with an enlarging primary right breast mass, enlarging right axillary lymph nodes, enlarging pulmonary nodules and new/enlarging hepatic metastases.  (4) Abraxane started 03/19/2013, given day 1 and day 8 of each 21 day cycle, stopped with the day 1 cycle 3 dose (04/29/2013) because of local  progression of disease  (5) cyclophosphamide and doxorubicin started 05/20/2013, given in dose dense fashion with Neulasta support on day 2. Completed 4 cycles 07/03/2013, with the final cycle dose reduced 15% because of an episode of febrile neutropenia after cycle 3. Restaging studies 07/15/2013 showed partial response.  (6) started capecitabine April 2015, 1.5 g by mouth twice a day, 7 days on 7 days off, last dose 02/08/2014 -- discontinued with evidence of disease progression (7) s/p Right simple mastectomy 02/17/2014 to optimize local control, the pathology ([facility] [id number]) showing an invasive ductal carcinoma measuring 4.8 cm, grade 3, with close but negative margins and evidence of treatment response  (8) multiple brain metastases noted on brain MRI 02/26/2014--started whole brain irradiation 02/28/2014  Plan: Pati is symptomatically improved. I think we can change meds to po and start PT/OT with a view to discharge home later this week if she becomes more ambulatory.  I discussed the overall situation with her and her husband. She does have measurable systemic disease by the CT scans obtained this past weekend, but those small lesions are unlikely to cause her any trouble in the next weeks or months. Because they do show some growth, we are stopping the capecitabine. I do not recommend initiating systemic chemotherapy in the absence of symptoms. Instead we will focus on the CNS involvement. She will have a brain MRI 2 months after completion of radiation and if clear I would wait on a second one 3 months later before considering systemic chemotherapy. They also have time to consider research protocols at [facility] but Terena's experience in that regard was not encouraging.  Before discharge I will address advanced directives again with her and her husband.  Appreciate your excellent care to this patient!    Chauncey Cruel, MD 03/02/2014  6:48 PM

## 2014-03-02 NOTE — Telephone Encounter (Signed)
[facility] sent facsimile confirmation of CAPECITABINE prescription shipment.  CAPECITABINE was shipped on 02-27-2014 with next business day delivery.

## 2014-03-02 NOTE — Progress Notes (Signed)
[facility] Therapy Treatment Record Phone [phone]   Radiation Therapy was administered to Carrie Berry on: 03/02/2014  4:46 PM and was treatment # 3 out of a planned course of 14 treatments.

## 2014-03-02 NOTE — Progress Notes (Addendum)
Patient ID: Carrie Berry, female   DOB: 1950/08/16, 63 y.o.   MRN: [id number]  [facility] PROGRESS NOTE  Carrie Berry XFG:[id number] DOB: 05/28/1950 DOA: 02/25/2014 PCP: [name], MD  Brief narrative:  63 y.o. 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 Patient presented 02/25/2014 with complaints of severe pounding headache in the back and on the left side associated with nausea. Patient also reported decreased fine motor skills. On admission, CT head and MRI brain are both significant for multiple brain metastases, the largest concentration in the posterior right frontal and right parietal lobe. She also has a right occipital lesion, left frontal lobe lesion, cerebellar lesion and midline shift at the foramen of [place]. Pt started RT on 02/27/2014.   Assessment/Plan:   Principal Problem:  Headache / brain metastases and vasogenic edema / midline shift  Headaches likely due to brain metastases, vasogenic edema and midline shift. No seizures.  Started RT 02/27/2014. Continue as planned per Dr. Tammi Klippel. Today 03/02/2014 she will have #3 RT Continue decadron 8 mg IV every 6 hours. Continue keppra for seizure prophylaxis.  Continue supportive care for nausea with antiemetics. Diet as tolerated.  Continue pain management efforts for headache. Active Problems:  Leukocytosis  Likely from Decadron, repeat CBC in AM  Pt is afebrile  History of breast cancer with lung and liver metastases  Appreciate oncology following.  Brain metastases from metastatic breast cancer: Currently receiving radiation therapy.  Per Ct scan, one of the liver metastases have increased in size the other one had shrunk in size, the remainder of the nodules in the liver are stable. Consistent with progression of disease.    DVT Prophylaxis  SCD's bilaterally. GI prophylaxis   Since pt on decadron, added protonix for GI prophylaxis.  Code Status: Full.  Family Communication: plan of care discussed with the patient and her daughter at the bedside.  Disposition Plan: Home when stable.   IV Access:   PAC  Peripheral IV line Procedures and diagnostic studies:   Dg Chest 2 View 02/25/2014 No radiographic evidence of acute cardiopulmonary disease. Unchanged position of right IJ port catheter. Interval surgical changes of right mastectomy.  Ct Head Wo Contrast 02/25/2014 Evidence of multiple brain metastases of the supratentorial and infratentorial brain, with associated vasogenic edema. The right-sided edema is worse, and contributes to 1 cm of right to left midline shift at the foramen of [place]. Further evaluation with contrast MRI is recommended.  Mr [name] Contrast 02/26/2014 1. Multiple enhancing mass lesions scattered throughout the brain. The largest concentration is in the posterior right frontal and right parietal lobe. 2. The largest lesion is in the posterior right frontal lobe, measuring 18 x 21 x 18 mm. 3. A more lateral right occipital lesion measures 17 x 15 x 12 mm. 4. High posterior left frontal lobe measuring 5.5 mm. 5. 18 mm right cerebellar lesion. 6. Significant midline shift measures 5 mm at the foramen of [place].  Ct Chest W Contrast 02/28/2014 Interval progression of pulmonary and hepatic metastases Status post right mastectomy without complicating feature. Trace amount of free pelvic fluid of uncertain etiology.  Ct Abdomen Pelvis W Contrast 02/28/2014 Interval progression of pulmonary and hepatic metastases. Status post right mastectomy without complicating feature. Trace amount of free pelvic fluid of uncertain etiology.  Medical Consultants:  Dr. [name] (Oncology)  Dr. [name] (Radiation oncology)  Dr. [name] (Surgery) Other Consultants:   None   Anti-Infectives:   None   [name], MD  [facility] Pager [phone]  If 7PM-7AM, please contact night-coverage [email or web address] Password [email or web address] 03/02/2014, 12:30 PM   LOS: 5 days   HPI/Subjective: No acute overnight events.  Objective: Filed Vitals:   03/01/14 0500 03/01/14 1402 03/01/14 2000 03/02/14 0607  BP: 136/66 145/63 136/56 141/73  Pulse: 65 68 71 61  Temp: 98.1 F (36.7 C) 98 F (36.7 C) 98.8 F (37.1 C) 97.7 F (36.5 C)  TempSrc: Oral Oral Oral Axillary  Resp: 16 16 16 16   Height:      Weight:      SpO2: 98% 98% 98% 97%    Intake/Output Summary (Last 24 hours) at 03/02/14 1230 Last data filed at 03/02/14 0928  Gross per 24 hour  Intake    120 ml  Output   3400 ml  Net  -3280 ml    Exam:   General:  Pt is alert, follows commands appropriately, not in acute distress  Cardiovascular: Regular rate and rhythm, S1/S2, no murmurs  Respiratory: Clear to auscultation bilaterally, no wheezing, no crackles, no rhonchi  Abdomen: Soft, non tender, non distended, bowel sounds present   Data Reviewed: Basic Metabolic Panel:  Recent Labs Lab 02/25/14 1656 02/26/14 0436 03/02/14 0530  NA 141 140 137  K 4.3 4.9 4.1  CL 101 104 100  CO2 27 24 27   GLUCOSE 131* 148* 131*  BUN 11 9 15   CREATININE 0.82 0.76 0.57  CALCIUM 9.5 9.4 8.6   Liver Function Tests:  Recent Labs Lab 02/25/14 1656  AST 24  ALT 19  ALKPHOS 99  BILITOT 0.2*  PROT 7.2  ALBUMIN 3.6   CBC:  Recent Labs Lab 02/25/14 1656 02/26/14 0436 03/02/14 0530  WBC 8.9 7.4 10.6*  HGB 12.3 12.0 11.3*  HCT 35.3* 34.4* 32.8*  MCV 98.3 98.9 99.1  PLT 233 201 146*   CBG:  Recent Labs Lab 02/28/14 1829 03/01/14 0731 03/01/14 1134 03/01/14 1701 03/02/14 0809  GLUCAP 124* 128* 145* 118* 140*   Scheduled Meds: . dexamethasone  8 mg Intravenous 4 times per day  . docusate sodium  200 mg Oral BID  . fluticasone  2 spray Each Nare Daily  . insulin aspart  0-9 Units  Subcutaneous TID WC  . levETIRAcetam  500 mg Intravenous Q12H  . olopatadine  1 drop Both Eyes BID  . pantoprazole  40 mg Oral Daily  . polyethylene glycol  17 g Oral BID  . prochlorperazine  10 mg Intravenous TID AC & HS  . sodium chloride  3 mL Intravenous Q12H  . traMADol  100 mg Oral 4 times per day  . traZODone  100 mg Oral QHS   Continuous Infusions: . sodium chloride 125 mL/hr at 03/02/14 1216

## 2014-03-03 ENCOUNTER — Ambulatory Visit
Admit: 2014-03-03 | Discharge: 2014-03-03 | Disposition: A | Discharge status: Home / Self Care | Payer: BC Managed Care – PPO | Attending: Radiation Oncology | Admitting: Radiation Oncology

## 2014-03-03 ENCOUNTER — Encounter: Payer: Self-pay | Admitting: Specialist

## 2014-03-03 LAB — BASIC METABOLIC PANEL
Anion gap: 10 (ref 5–15)
BUN: 16 mg/dL (ref 6–23)
CALCIUM: 8.6 mg/dL (ref 8.4–10.5)
CO2: 28 mEq/L (ref 19–32)
CREATININE: 0.57 mg/dL (ref 0.50–1.10)
Chloride: 103 mEq/L (ref 96–112)
GFR calc Af Amer: >90 mL/min (ref >90)
GLUCOSE: 126 mg/dL — AB (ref 70–99)
Potassium: 4.1 mEq/L (ref 3.7–5.3)
Sodium: 141 mEq/L (ref 137–147)

## 2014-03-03 LAB — CBC
HCT: 34.4 % — ABNORMAL LOW (ref 36.0–46.0)
Hemoglobin: 12.1 g/dL (ref 12.0–15.0)
MCH: 34.8 pg — AB (ref 26.0–34.0)
MCHC: 35.2 g/dL (ref 30.0–36.0)
MCV: 98.9 fL (ref 78.0–100.0)
Platelets: 154 10*3/uL (ref 150–400)
RBC: 3.48 MIL/uL — AB (ref 3.87–5.11)
RDW: 14 % (ref 11.5–15.5)
WBC: 12.6 10*3/uL — ABNORMAL HIGH (ref 4.0–10.5)

## 2014-03-03 LAB — GLUCOSE, CAPILLARY
GLUCOSE-CAPILLARY: 140 mg/dL — AB (ref 70–99)
GLUCOSE-CAPILLARY: 106 mg/dL — AB (ref 70–99)
Glucose-Capillary: 117 mg/dL — ABNORMAL HIGH (ref 70–99)
Glucose-Capillary: 129 mg/dL — ABNORMAL HIGH (ref 70–99)

## 2014-03-03 NOTE — Progress Notes (Signed)
Paged to Carrie Berry by 3W unit secretary because patient's husband had questions about Advanced Directive paperwork. Answered his questions. Spouse then talked at length about the progression of the patient's illness. I responded empathetically to his pain over his wife's decline. I explored and assessed his relational needs and resources as he provides care for this wife. Provided education about the support center programs and resources. He was very interested in having one of our counseling interns help his wife because he was concerned about her "depression". He said she is not caring about her appearance like she used to and he expressed concerns about her "not talking as much" and withdrawing from family somewhat. We explored how a connection could be made.   Epifania Gore, PhD, [place]

## 2014-03-03 NOTE — Progress Notes (Signed)
INITIAL NUTRITION ASSESSMENT  Pt meets criteria for mild/moderate MALNUTRITION in the context of chronic illness as evidenced by weight loss of 3% in the past week, intake <75% for greater than 1 month, obvious decreased muscle mass.  DOCUMENTATION CODES Per approved criteria  -Non-severe (moderate) malnutrition in the context of chronic illness   INTERVENTION: Continue regular diet Continue Medical illustrator as desired  Encouraged intake RD to follow  NUTRITION DIAGNOSIS: Inadequate oral intake related to nauseas as evidenced by patient report.   Goal: Intake of meals and supplements to meet >90% estimated needs.  Monitor:  Intake, diet tolerance, labs, weight trend  Reason for Assessment: LOS  63 y.o. 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  S/p fall this am with skin tear to left arm.  Currently awaiting breakfast and is very hungry.  Drinking [employer] Breakfast Essentials brought from home as desired.  Intake here has been 50% of meals. Prior to admit,  poor intake due to nausea for the past several weeks.  BM 10/19.  Weight appears stable per e chart.  Recheck of bed weight today was 118 lbs.    Height: Ht Readings from Last 1 Encounters:  02/25/14 5' 3.5" (1.613 m)    Weight: Wt Readings from Last 1 Encounters:  02/25/14 122 lb 9.2 oz (55.6 kg)  03/03/14  118 lbs  Ideal Body Weight: 118 lbs  % Ideal Body Weight: 100  Wt Readings from Last 10 Encounters:  02/25/14 122 lb 9.2 oz (55.6 kg)  02/17/14 122 lb 9.6 oz (55.611 kg)  02/17/14 122 lb 9.6 oz (55.611 kg)  02/13/14 122 lb 9.6 oz (55.611 kg)  01/26/14 122 lb 12.8 oz (55.702 kg)  01/06/14 122 lb 11.2 oz (55.656 kg)  12/08/13 121 lb 1.6 oz (54.931 kg)  11/17/13 119 lb 11.2 oz (54.296 kg)  10/13/13 119 lb 14.4 oz (54.386 kg)  09/15/13 121 lb 11.2 oz (55.203 kg)     Usual Body Weight: 122 lbs in May 2015 but 129 lbs in February 2015.  % Usual Body Weight: 97  BMI:  Body mass index is 21.37 kg/(m^2).  Estimated Nutritional Needs: Kcal: 1700-1900 Protein: 70-80 gm Fluid: >1.7L daily  Skin: tear left arm after fall today  Diet Order: General  EDUCATION NEEDS: -Education needs addressed   Intake/Output Summary (Last 24 hours) at 03/03/14 1018 Last data filed at 03/03/14 0522  Gross per 24 hour  Intake 7538.33 ml  Output   1301 ml  Net 6237.33 ml    Last BM: 10/19   Labs:   Recent Labs Lab 02/26/14 0436 03/02/14 0530 03/03/14 0530  NA 140 137 141  K 4.9 4.1 4.1  CL 104 100 103  CO2 24 27 28   BUN 9 15 16   CREATININE 0.76 0.57 0.57  CALCIUM 9.4 8.6 8.6  GLUCOSE 148* 131* 126*    CBG (last 3)   Recent Labs  03/02/14 1148 03/02/14 1718 03/03/14 0721  GLUCAP 182* 96 117*    Scheduled Meds: . dexamethasone  8 mg Oral Q12H  . docusate sodium  200 mg Oral BID  . fluticasone  2 spray Each Nare Daily  . insulin aspart  0-9 Units Subcutaneous TID WC  . levETIRAcetam  500 mg Intravenous Q12H  . olopatadine  1 drop Both Eyes BID  . pantoprazole  40 mg Oral Daily  . polyethylene glycol  17 g  Oral BID  . prochlorperazine  10 mg Intravenous TID AC & HS  . sodium chloride  3 mL Intravenous Q12H  . traMADol  100 mg Oral 4 times per day  . traZODone  100 mg Oral QHS    Continuous Infusions: . sodium chloride 50 mL/hr at 03/03/14 0522    Past Medical History  Diagnosis Date  . Recurrent sinus infections   . Arthritis   . PONV (postoperative nausea and vomiting)   . GERD (gastroesophageal reflux disease)   . 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 Anxiety     no problems at present  . rt breast ca dx'd 12/2011    breast  . Breast cancer   . 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 Tubal ligation    . Tubal  ligation  1985  . Portacath placement N/A 01/10/2013    Procedure: ULTRASOUND GUIDED PORT-A-CATH INSERTION WITH FLUOROSCOPY;  Surgeon: [name], MD;  Location: [facility];  Service: General;  Laterality: N/A;  . Esophagogastroduodenoscopy N/A 08/19/2013    Procedure: ESOPHAGOGASTRODUODENOSCOPY (EGD);  Surgeon: Garlan Fair, MD;  Location: [facility];  Service: Endoscopy;  Laterality: N/A;  . Total mastectomy Right 02/17/2014    Procedure: PALLIATIVE  RIGHT MASTECTOMY;  Surgeon: [name], MD;  Location: [facility];  Service: General;  Laterality: Right;    Antonieta Iba, RD, LDN Clinical Inpatient Dietitian Pager:  [phone] Weekend and after hours pager:  [phone]

## 2014-03-03 NOTE — Progress Notes (Signed)
[facility] Therapy Treatment Record Phone [phone]   Radiation Therapy was administered to Carrie Berry on: 03/03/2014  2:50 PM and was treatment # 4 out of a planned course of 14 treatments.

## 2014-03-03 NOTE — Evaluation (Signed)
Physical Therapy Evaluation Patient Details Name: NAVEEN LORUSSO MRN: [id number] DOB: [birth year] Today's Date: 03/03/2014   History of Present Illness  63 yo female admitted with brain mets. Sustained fall 10/20 during hospital stay. Hx of breast cancer, R mastectomy 02/17/14, mets to liver, lung  Clinical Impression  On eval, pt required Min assist for mobility-able to ambulate ~75 feet with rolling walker. Pt is unsteady. Some difficulty with avoiding obstacles especially on L side. Tolerated activity fairly well.     Follow Up Recommendations Home health PT;Supervision/Assistance - 24 hour    Equipment Recommendations  Rolling walker with 5" wheels    Recommendations for Other Services OT consult     Precautions / Restrictions Precautions Precautions: Fall Restrictions Weight Bearing Restrictions: No      Mobility  Bed Mobility Overal bed mobility: Needs Assistance Bed Mobility: Supine to Sit;Sit to Supine     Supine to sit: Min guard Sit to supine: Supervision   General bed mobility comments: Increased time. Noted reliance on L UE to prevent LOB/fall back onto bed.   Transfers Overall transfer level: Needs assistance Equipment used: Rolling walker (2 wheeled) Transfers: Sit to/from Stand Sit to Stand: Min assist         General transfer comment: assist to rise, stabilize, control descent. VCS safety, technique, hand placement  Ambulation/Gait Ambulation/Gait assistance: Min assist Ambulation Distance (Feet): 75 Feet Assistive device: Rolling walker (2 wheeled) Gait Pattern/deviations: Step-through pattern;Decreased stride length     General Gait Details: assist to stabilize pt and guide walker/avoid obstacles at times. Pt tends to stay towards L side of walker and she also tends to steer into objects on L side.   Stairs            Wheelchair Mobility    Modified Rankin (Stroke Patients Only)       Balance Overall balance assessment: Needs  assistance;History of Falls Sitting-balance support: No upper extremity supported Sitting balance-Leahy Scale: Fair Sitting balance - Comments: sat EOB for several minutes. Pt initially relying moderately on L hand/UE for support. With time, able to sit unsupported without immediate LOB   Standing balance support: Bilateral upper extremity supported;During functional activity Standing balance-Leahy Scale: Poor                               Pertinent Vitals/Pain Pain Assessment: No/denies pain    Home Living Family/patient expects to be discharged to:: Private residence Living Arrangements: Spouse/significant other   Type of Home: House Home Access: Stairs to enter Entrance Stairs-Rails: Right;Left;Can reach both Technical brewer of Steps: 5 Home Layout: One level Home Equipment: None      Prior Function Level of Independence: Needs assistance      ADL's / Homemaking Assistance Needed: husband assisting with bathing, dressing, meals, housework        Hand Dominance        Extremity/Trunk Assessment               Lower Extremity Assessment: RLE deficits/detail;LLE deficits/detail RLE Deficits / Details: Strength at least 4/5 throughout LLE Deficits / Details: hip flex 3+/5, knee ext 3+/5, knee flex 3/5, DF/PF 3+/5  Cervical / Trunk Assessment: Normal  Communication   Communication: No difficulties  Cognition Arousal/Alertness: Awake/alert Behavior During Therapy: WFL for tasks assessed/performed Overall Cognitive Status: Within Functional Limits for tasks assessed  General Comments      Exercises        Assessment/Plan    PT Assessment Patient needs continued PT services  PT Diagnosis Difficulty walking;Abnormality of gait   PT Problem List Decreased strength;Decreased activity tolerance;Decreased balance;Decreased mobility;Decreased knowledge of use of DME;Decreased coordination;Decreased safety  awareness  PT Treatment Interventions DME instruction;Gait training;Functional mobility training;Therapeutic activities;Therapeutic exercise;Balance training;Patient/family education   PT Goals (Current goals can be found in the Care Plan section) Acute Rehab PT Goals Patient Stated Goal: none stated PT Goal Formulation: With patient Time For Goal Achievement: 03/17/14 Potential to Achieve Goals: Good    Frequency Min 3X/week   Barriers to discharge        Co-evaluation               End of Session Equipment Utilized During Treatment: Gait belt Activity Tolerance: Patient tolerated treatment well Patient left: in bed;with call bell/phone within reach;with family/visitor present;with bed alarm set           Time: 1040-1056 PT Time Calculation (min): 16 min   Charges:   PT Evaluation $Initial PT Evaluation Tier I: 1 Procedure PT Treatments $Gait Training: 8-22 mins   PT G Codes:          Weston Anna, MPT Pager: [phone]

## 2014-03-03 NOTE — Evaluation (Signed)
Occupational Therapy Evaluation Patient Details Name: Carrie Berry MRN: [id number] DOB: March 04, 1951 Today's Date: 03/03/2014    History of Present Illness 63 yo female admitted with brain mets. Sustained fall 10/20 during hospital stay. Hx of breast cancer, R mastectomy 02/17/14, mets to liver, lung   Clinical Impression   Pt was admitted for the above.  She will benefit from skilled OT to increase safety and independence with adls.   Pt was sleepy at time of evaluation and would benefit from activities to improve strength, endurance, coordination and balance to increase independence with adls.  Pt stated she needed assistance getting to bathroom and with buttons prior to admission.      Follow Up Recommendations  Home health OT;Supervision/Assistance - 24 hour    Equipment Recommendations  3 in 1 bedside comode    Recommendations for Other Services       Precautions / Restrictions Precautions Precautions: Fall Restrictions Weight Bearing Restrictions: No      Mobility Bed Mobility Overal bed mobility: Needs Assistance Bed Mobility: Supine to Sit;Sit to Supine     Supine to sit: Min guard Sit to supine: Supervision   General bed mobility comments: increased time  Transfers Overall transfer level: Needs assistance Equipment used: 1 person hand held assist Transfers: Sit to/from Stand Sit to Stand: Min assist Stand pivot transfers: Min assist       General transfer comment: assist to rise and steady    Balance Overall balance assessment: Needs assistance;History of Falls Sitting-balance support: No upper extremity supported Sitting balance-Leahy Scale: Fair Sitting balance - Comments: min guard when donning socks for safety   Standing balance support: Bilateral upper extremity supported;During functional activity Standing balance-Leahy Scale: Poor                              ADL Overall ADL's : Needs assistance/impaired                      Lower Body Dressing: Sit to/from stand;Minimal assistance   Toilet Transfer: Minimal assistance;Stand-pivot;BSC             General ADL Comments: pt sleepy but agreeable to working with OT.  Able to flex legs up to don socks. Pt reports decreased coordination with L hand when buttoning and picking up very small objects:  will further evaluate this on next visit.  Needs min A for transfers.  Pt fatiqued:  did not want to perform any other ADLs this am. Pt needs min A overall for ADLs and she needs rest breaks for endurance.   Brought chair, alarm, and alarm pad in case pt would like to sit up later.  Encouraged her to continue to perform short bursts of activity for endurance.     Vision                 Additional Comments: not tested   Perception     Praxis Praxis Praxis-Other Comments: wfls    Pertinent Vitals/Pain Pain Assessment: No/denies pain     Hand Dominance Right   Extremity/Trunk Assessment Upper Extremity Assessment Upper Extremity Assessment: RUE deficits/detail RUE Deficits / Details: able to lift to 110 comfortably; no drain      Cervical / Trunk Assessment Cervical / Trunk Assessment: Normal   Communication Communication Communication: No difficulties   Cognition Arousal/Alertness: Lethargic Behavior During Therapy: WFL for tasks assessed/performed Overall Cognitive Status: Within Functional Limits for tasks  assessed                     General Comments       Exercises       Shoulder Instructions      Home Living Family/patient expects to be discharged to:: Private residence Living Arrangements: Spouse/significant other   Type of Home: House Home Access: Stairs to enter Technical brewer of Steps: 5 Entrance Stairs-Rails: Right;Left;Can reach both Home Layout: One level               Home Equipment: None   Additional Comments: pt has a standard commode and walk in shower with seat      Prior  Functioning/Environment Level of Independence: Needs assistance    ADL's / Homemaking Assistance Needed: husband assisting with bathing, dressing, meals, housework   Comments: assistance getting to bathroom    OT Diagnosis: Generalized weakness   OT Problem List: Decreased strength;Decreased activity tolerance;Impaired balance (sitting and/or standing);Decreased knowledge of use of DME or AE;Decreased safety awareness;Decreased coordination   OT Treatment/Interventions: Self-care/ADL training;DME and/or AE instruction;Energy conservation;Patient/family education;Balance training;Therapeutic activities;Therapeutic exercise    OT Goals(Current goals can be found in the care plan section) Acute Rehab OT Goals Patient Stated Goal: none stated OT Goal Formulation: With patient Time For Goal Achievement: 03/17/14 Potential to Achieve Goals: Good ADL Goals Pt Will Perform Grooming: with min guard assist;standing Pt Will Perform Lower Body Bathing: with min guard assist;sit to/from stand Pt Will Perform Lower Body Dressing: with min guard assist;sit to/from stand Pt Will Transfer to Toilet: with min guard assist;bedside commode;ambulating Pt Will Perform Toileting - Clothing Manipulation and hygiene: with min guard assist;sit to/from stand Pt Will Perform Tub/Shower Transfer: with min assist;ambulating;Shower transfer;shower Hotel manager will Perform Home Exercise Program: With Supervision;With written HEP provided (increased coordination)  OT Frequency: Min 2X/week   Barriers to D/C:            Co-evaluation              End of Session    Activity Tolerance: Patient tolerated treatment well Patient left: in bed;with call bell/phone within reach;with bed alarm set;with family/visitor present   Time: 4818-5631 OT Time Calculation (min): 20 min Charges:  OT General Charges $OT Visit: 1 Procedure OT Evaluation $Initial OT Evaluation Tier I: 1 Procedure OT  Treatments $Self Care/Home Management : 8-22 mins G-Codes:    Tedra Coppernoll 2014-03-13, 11:57 AM   Lesle Chris, OTR/L [phone] 03-13-14

## 2014-03-03 NOTE — Progress Notes (Signed)
Patient's husband called staff into room.  This RN noticed call bell and immediately went  to room.  Husband reports that he heard patient fall and then noticed that she was on the floor right beside him.  She was attempting to get to the [facility] and was in a deep sleep per patient.  Prior to this event, patient had been notifying husband for bathroom assistance per pt.'s husband.  Patient was slowly assisted to the side of the bed.  She reports falling on her left side/hip.  Patient was laying face down when found.  She reports not falling on her head.  She also reports no pain. Neuro and muscular assessment WNL.  Patient did have an episode of incontinence.  She did have a small skin tear to left arm.  Patient was immediately assessed, bathed, and dressing applied to site.  Assessment WNL except for skin tear.  Patient alert and oriented but forgetful at times.  Vital signs were taken, which were baseline, AC notified, MD notified.  Fall  huddle completed.  Will monitor vitals Q 4 hr x 24 hours and I will continue to monitor patient throughout the night.  Bed alarm will now be on at all times!!.Carrie Berry

## 2014-03-03 NOTE — Progress Notes (Signed)
Patient ID: Carrie Berry, female   DOB: 15-Dec-1950, 63 y.o.   MRN: [id number]  [facility] PROGRESS NOTE  Carrie Berry:[id number] DOB: March 21, 1951 DOA: 02/25/2014 PCP: Carrie Hams, MD  Brief narrative:  63 y.o. 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 Patient presented 02/25/2014 with complaints of severe pounding headache in the back and on the left side associated with nausea. Patient also reported decreased fine motor skills. On admission, CT head and MRI brain are both significant for multiple brain metastases, the largest concentration in the posterior right frontal and right parietal lobe. She also has a right occipital lesion, left frontal lobe lesion, cerebellar lesion and midline shift at the foramen of [place]. Pt started RT on 02/27/2014.   Assessment/Plan:   Principal Problem:  Headache / brain metastases and vasogenic edema / midline shift   Headaches likely due to brain metastases, vasogenic edema and midline shift. No seizures.   Started RT 02/27/2014. So far patient completed 3 RT.  Continue decadron 8 mg IV every 6 hours.   Continue keppra for seizure prophylaxis.   Nausea improved. Diet as tolerated. Has scheduled compazine 10 mg IV TIDAC&HS.  Headache better this am. Active Problems:  Leukocytosis   Likely from Decadron. No evidence of infection.  History of breast cancer with lung and liver metastases   Brain metastases from metastatic breast cancer: Currently RT as planned.  Per Ct scan, one of the liver metastases have increased in size the other one had shrunk in size, the remainder of the nodules in the liver are stable. Consistent with progression of disease.  DVT Prophylaxis   SCD's bilaterally. GI prophylaxis   Since pt on decadron, added protonix  for GI prophylaxis.   Code Status: Full.  Family Communication: plan of care discussed with the patient and her daughter at the bedside.  Disposition Plan: Home when stable.    IV Access:   PAC  Peripheral IV line Procedures and diagnostic studies:   Dg Chest 2 View 02/25/2014 No radiographic evidence of acute cardiopulmonary disease. Unchanged position of right IJ port catheter. Interval surgical changes of right mastectomy.  Ct Head Wo Contrast 02/25/2014 Evidence of multiple brain metastases of the supratentorial and infratentorial brain, with associated vasogenic edema. The right-sided edema is worse, and contributes to 1 cm of right to left midline shift at the foramen of [place]. Further evaluation with contrast MRI is recommended.  Mr Carrie Berry Contrast 02/26/2014 1. Multiple enhancing mass lesions scattered throughout the brain. The largest concentration is in the posterior right frontal and right parietal lobe. 2. The largest lesion is in the posterior right frontal lobe, measuring 18 x 21 x 18 mm. 3. A more lateral right occipital lesion measures 17 x 15 x 12 mm. 4. High posterior left frontal lobe measuring 5.5 mm. 5. 18 mm right cerebellar lesion. 6. Significant midline shift measures 5 mm at the foramen of [place].  Ct Chest W Contrast 02/28/2014 Interval progression of pulmonary and hepatic metastases Status post right mastectomy without complicating feature. Trace amount of free pelvic fluid of uncertain etiology.  Ct Abdomen Pelvis W Contrast 02/28/2014 Interval progression of pulmonary and hepatic metastases. Status post right mastectomy without complicating feature. Trace amount of free pelvic fluid of uncertain etiology.  Medical Consultants:   Dr. [name] (Oncology)  Dr. [name] (Radiation oncology)  Dr. [name] (Surgery) Other Consultants:   None  Anti-Infectives:   None     Carrie Lenz, MD  [facility] Pager [phone]  If 7PM-7AM,  please contact night-coverage [email or web address] Password [email or web address] 03/03/2014, 7:01 AM   LOS: 6 days    HPI/Subjective: No acute overnight events.  Objective: Filed Vitals:   03/02/14 1329 03/02/14 2031 03/03/14 0230 03/03/14 0523  BP: 125/63 154/79 120/53 152/73  Pulse: 81 65 59 62  Temp: 98.3 F (36.8 C) 98.1 F (36.7 C) 98 F (36.7 C) 98.9 F (37.2 C)  TempSrc: Oral Oral Oral Oral  Resp: 16 18 18 18   Height:      Weight:      SpO2: 100% 95% 98% 98%    Intake/Output Summary (Last 24 hours) at 03/03/14 0701 Last data filed at 03/03/14 0522  Gross per 24 hour  Intake 7658.33 ml  Output   1701 ml  Net 5957.33 ml    Exam:   General:  Pt is alert, feels better  Cardiovascular: Regular rate and rhythm, S1/S2 appreciated   Respiratory: Nwheezing, no crackles, no rhonchi  Abdomen: Soft, non tender, non distended, bowel sounds present  Extremities: pulses palpable bilaterally  Neuro: Grossly nonfocal  Data Reviewed: Basic Metabolic Panel:  Recent Labs Lab 02/25/14 1656 02/26/14 0436 03/02/14 0530 03/03/14 0530  NA 141 140 137 141  K 4.3 4.9 4.1 4.1  CL 101 104 100 103  CO2 27 24 27 28   GLUCOSE 131* 148* 131* 126*  BUN 11 9 15 16   CREATININE 0.82 0.76 0.57 0.57  CALCIUM 9.5 9.4 8.6 8.6   Liver Function Tests:  Recent Labs Lab 02/25/14 1656  AST 24  ALT 19  ALKPHOS 99  BILITOT 0.2*  PROT 7.2  ALBUMIN 3.6   No results found for this basename: LIPASE, AMYLASE,  in the last 168 hours No results found for this basename: AMMONIA,  in the last 168 hours CBC:  Recent Labs Lab 02/25/14 1656 02/26/14 0436 03/02/14 0530 03/03/14 0530  WBC 8.9 7.4 10.6* 12.6*  HGB 12.3 12.0 11.3* 12.1  HCT 35.3* 34.4* 32.8* 34.4*  MCV 98.3 98.9 99.1 98.9  PLT 233 201 146* 154   Cardiac Enzymes: No results found for this basename: CKTOTAL, CKMB, CKMBINDEX, TROPONINI,  in the last 168 hours BNP: No components found with this basename: POCBNP,  CBG:  Recent  Labs Lab 03/01/14 1134 03/01/14 1701 03/02/14 0809 03/02/14 1148 03/02/14 1718  GLUCAP 145* 118* 140* 182* 96    No results found for this or any previous visit (from the past 240 hour(s)).   Scheduled Meds: . dexamethasone  8 mg Oral Q12H  . docusate sodium  200 mg Oral BID  . fluticasone  2 spray Each Nare Daily  . insulin aspart  0-9 Units Subcutaneous TID WC  . levETIRAcetam  500 mg Intravenous Q12H  . olopatadine  1 drop Both Eyes BID  . pantoprazole  40 mg Oral Daily  . polyethylene glycol  17 g Oral BID  . prochlorperazine  10 mg Intravenous TID AC & HS  . traMADol  100 mg Oral 4 times per day  . traZODone  100 mg Oral QHS   Continuous Infusions: . sodium chloride 50 mL/hr at 03/03/14 0522

## 2014-03-04 ENCOUNTER — Ambulatory Visit
Admit: 2014-03-04 | Discharge: 2014-03-04 | Disposition: A | Discharge status: Home / Self Care | Payer: BC Managed Care – PPO | Attending: Radiation Oncology | Admitting: Radiation Oncology

## 2014-03-04 ENCOUNTER — Other Ambulatory Visit: Payer: BC Managed Care – PPO

## 2014-03-04 ENCOUNTER — Encounter: Payer: BC Managed Care – PPO | Admitting: Nurse Practitioner

## 2014-03-04 DIAGNOSIS — C773 Secondary and unspecified malignant neoplasm of axilla and upper limb lymph nodes: Secondary | ICD-10-CM

## 2014-03-04 DIAGNOSIS — C7931 Secondary malignant neoplasm of brain: Secondary | ICD-10-CM

## 2014-03-04 LAB — GLUCOSE, CAPILLARY
GLUCOSE-CAPILLARY: 99 mg/dL (ref 70–99)
Glucose-Capillary: 115 mg/dL — ABNORMAL HIGH (ref 70–99)

## 2014-03-04 MED ORDER — DOCUSATE SODIUM 100 MG PO CAPS
100.0000 mg | ORAL_CAPSULE | Freq: Every day | ORAL | Status: DC
  Administered 2014-03-04 – 2014-03-10 (×5): 100 mg via ORAL
  Filled 2014-03-04 (×7): qty 1

## 2014-03-04 MED ORDER — POLYETHYLENE GLYCOL 3350 17 G PO PACK
17.0000 g | PACK | Freq: Every day | ORAL | Status: DC | PRN
  Filled 2014-03-04: qty 1

## 2014-03-04 MED ORDER — PROCHLORPERAZINE MALEATE 5 MG PO TABS: 5.0000 mg | ORAL_TABLET | Freq: Three times a day (TID) | ORAL | Status: DC

## 2014-03-04 MED ORDER — LORAZEPAM 0.5 MG PO TABS: 0.5000 mg | ORAL_TABLET | Freq: Four times a day (QID) | ORAL | Status: DC | PRN

## 2014-03-04 MED ORDER — PROCHLORPERAZINE MALEATE 5 MG PO TABS
5.0000 mg | ORAL_TABLET | Freq: Three times a day (TID) | ORAL | Status: DC
  Administered 2014-03-04 – 2014-03-10 (×26): 5 mg via ORAL
  Filled 2014-03-04 (×30): qty 1

## 2014-03-04 NOTE — Progress Notes (Signed)
OT Cancellation Note  Patient Details Name: Carrie Berry MRN: [id number] DOB: 1950/09/12   Cancelled Treatment:    Reason Eval/Treat Not Completed: Other (comment).  Attempted tx twice: pt had just gotten lunch and now is out for XRT.  Will reattempt tomorrow if schedule permits.  Sanari Offner 03/04/2014, 3:01 PM Lesle Chris, OTR/L [phone] 03/04/2014

## 2014-03-04 NOTE — Progress Notes (Signed)
[facility] Therapy Treatment Record Phone [phone]   Radiation Therapy was administered to Carrie Berry on: 03/04/2014  2:49 PM and was treatment # 5 out of a planned course of 14 treatments.

## 2014-03-04 NOTE — Care Management Note (Signed)
CARE MANAGEMENT NOTE 03/04/2014  Patient:  Carrie Berry, Carrie Berry   Account Number:  [id number]  Date Initiated:  02/27/2014  Documentation initiated by:  Marney Doctor  Subjective/Objective Assessment:   63 yo admitted with brain mets.  Hx of breast CA     Action/Plan:   From home with spouse   Anticipated DC Date:  03/02/2014   Anticipated DC Plan:  [place]  CM consult      [facility] Choice  HOME HEALTH   Choice offered to / List presented to:  C-1 Patient   DME arranged  3-N-1  Vassie Moselle      DME agency  [place] arranged  HH-1 RN  [place].   Status of service:  In process, will continue to follow Medicare Important Message given?   (If response is "NO", the following Medicare IM given date fields will be blank) Date Medicare IM given:   Medicare IM given by:   Date Additional Medicare IM given:   Additional Medicare IM given by:    Discharge Disposition:    Per UR Regulation:  Reviewed for med. necessity/level of care/duration of stay  If discussed at [place] of Stay Meetings, dates discussed:   03/03/2014    Comments:  03/04/14 Marney Doctor RN,BSN,NCM [phone] Met with pt and husband for DC needs. PT recommends HHPT and RW.  OT recommends 3 in 1.  Pt also requesting a Therapist, sports and an Aide at home.  Pt and husband offered choice of [place] agencies.  Husband has used [facility] previously and would like to use them again.  Will need MD orders for HHPT/OT/RN/Aide RW and 3 in 1.  [place] DME rep informed that pt would like equipment delivered to their home.  Husband Carrie Berry is best contact [phone]).  [facility] rep informed of need for [facility] services.  CM will continue to follow.  02/27/14 Marney Doctor RN,BSN,NCM Chart being reviewed for CM needs.  CM will continue to follow.

## 2014-03-04 NOTE — Plan of Care (Signed)
Problem: Phase I Progression Outcomes Goal: OOB as tolerated unless otherwise ordered Outcome: Progressing Patient with one assist with transfers and with toileting.

## 2014-03-04 NOTE — Progress Notes (Signed)
Patient ID: Carrie Berry, female   DOB: 1950-09-13, 63 y.o.   MRN: [id number] [facility] PROGRESS NOTE  Carrie Berry JME:[id number] DOB: 04-Jan-1951 DOA: 02/25/2014 PCP: Carrie Hams, MD  Brief narrative: 63 y.o. 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 Patient presented 02/25/2014 with complaints of severe pounding headache in the back and on the left side associated with nausea. Patient also reported decreased fine motor skills. On admission, CT head and MRI brain are both significant for multiple brain metastases, the largest concentration in the posterior right frontal and right parietal lobe. She also has a right occipital lesion, left frontal lobe lesion, cerebellar lesion and midline shift at the foramen of [place]. Pt started RT on 02/27/2014. Patient feels little better this morning but still reports feeling unsteady when she get up and tries to walk.    Assessment/Plan:   Principal Problem:  Headache / brain metastases and vasogenic edema / midline shift  Headaches likely due to brain metastases, vasogenic edema and midline shift. No seizures.  Started RT 02/27/2014. Continue RT as planned. Today, 10/21/215 patient will receive total of 5 RT. Headache better but still feels unsteady when tries to ambulate.  Continue decadron 8 mg IV every 6 hours.  Continue keppra for seizure prophylaxis.  Diet as tolerated.   Active Problems:  Leukocytosis  Likely from Decadron. No evidence of infection.  History of breast cancer with lung and liver metastases  Brain metastases from metastatic breast cancer: Currently RT as planned.  Per Ct scan, one of the liver metastases have increased in size the other one had shrunk in size, the remainder of the nodules in the liver are stable. Consistent with  progression of disease.  DVT Prophylaxis  SCD's bilaterally. GI prophylaxis  Protonix for GI prophylaxis.   Code Status: Full.  Family Communication: plan of care discussed with the patient and her daughter at the bedside.  Disposition Plan: Home when stable.    IV Access:   PAC  Peripheral IV line Procedures and diagnostic studies:   Dg Chest 2 View 02/25/2014 No radiographic evidence of acute cardiopulmonary disease. Unchanged position of right IJ port catheter. Interval surgical changes of right mastectomy.  Ct Head Wo Contrast 02/25/2014 Evidence of multiple brain metastases of the supratentorial and infratentorial brain, with associated vasogenic edema. The right-sided edema is worse, and contributes to 1 cm of right to left midline shift at the foramen of [place]. Further evaluation with contrast MRI is recommended.  Mr [name] Contrast 02/26/2014 1. Multiple enhancing mass lesions scattered throughout the brain. The largest concentration is in the posterior right frontal and right parietal lobe. 2. The largest lesion is in the posterior right frontal lobe, measuring 18 x 21 x 18 mm. 3. A more lateral right occipital lesion measures 17 x 15 x 12 mm. 4. High posterior left frontal lobe measuring 5.5 mm. 5. 18 mm right cerebellar lesion. 6. Significant midline shift measures 5 mm at the foramen of [place].  Ct Chest W Contrast 02/28/2014 Interval progression of pulmonary and hepatic metastases Status post right mastectomy without complicating feature. Trace amount of free pelvic fluid of uncertain etiology.  Ct Abdomen Pelvis W Contrast 02/28/2014 Interval progression of pulmonary and hepatic metastases. Status post right mastectomy without complicating feature. Trace amount of free pelvic fluid of uncertain etiology.  Medical Consultants:   Dr. [name] (Oncology)  Dr. [name] (Radiation oncology)  Dr. [name] (Surgery) Other Consultants:   None  Anti-Infectives:    None    [name], MD  [facility] Pager [phone]  If 7PM-7AM, please contact night-coverage [email or web address] Password [facility] 03/04/2014, 9:51 AM   LOS: 7 days    HPI/Subjective: Headache better this am, no incidents overnight.   Objective: Filed Vitals:   03/03/14 0523 03/03/14 1544 03/03/14 2054 03/04/14 0601  BP: 152/73 133/67 136/73 114/62  Pulse: 62 63 76 75  Temp: 98.9 F (37.2 C) 98.4 F (36.9 C) 98.1 F (36.7 C) 98.3 F (36.8 C)  TempSrc: Oral Oral Oral Oral  Resp: 18 16 12 16   Height:      Weight:      SpO2: 98% 97% 96% 98%    Intake/Output Summary (Last 24 hours) at 03/04/14 0951 Last data filed at 03/04/14 0600  Gross per 24 hour  Intake 1471.67 ml  Output    300 ml  Net 1171.67 ml    Exam:   General:  Pt is not in acute distress  Cardiovascular: Regular rate and rhythm, S1/S2, no murmurs  Respiratory: bilateral air entry, no wheezing   Abdomen: non distended, bowel sounds present  Extremities: pulses DP and PT palpable bilaterally  Neuro: Grossly nonfocal  Data Reviewed: Basic Metabolic Panel:  Recent Labs Lab 02/25/14 1656 02/26/14 0436 03/02/14 0530 03/03/14 0530  NA 141 140 137 141  K 4.3 4.9 4.1 4.1  CL 101 104 100 103  CO2 27 24 27 28   GLUCOSE 131* 148* 131* 126*  BUN 11 9 15 16   CREATININE 0.82 0.76 0.57 0.57  CALCIUM 9.5 9.4 8.6 8.6   Liver Function Tests:  Recent Labs Lab 02/25/14 1656  AST 24  ALT 19  ALKPHOS 99  BILITOT 0.2*  PROT 7.2  ALBUMIN 3.6   No results found for this basename: LIPASE, AMYLASE,  in the last 168 hours No results found for this basename: AMMONIA,  in the last 168 hours CBC:  Recent Labs Lab 02/25/14 1656 02/26/14 0436 03/02/14 0530 03/03/14 0530  WBC 8.9 7.4 10.6* 12.6*  HGB 12.3 12.0 11.3* 12.1  HCT 35.3* 34.4* 32.8* 34.4*  MCV 98.3 98.9 99.1 98.9  PLT 233 201 146* 154   Cardiac Enzymes: No results found for this basename: CKTOTAL, CKMB, CKMBINDEX, TROPONINI,   in the last 168 hours BNP: No components found with this basename: POCBNP,  CBG:  Recent Labs Lab 03/03/14 0721 03/03/14 1236 03/03/14 1703 03/03/14 2046 03/04/14 0759  GLUCAP 117* 140* 106* 129* 115*    No results found for this or any previous visit (from the past 240 hour(s)).   Scheduled Meds: . dexamethasone  8 mg Oral Q12H  . docusate sodium  100 mg Oral Daily  . fluticasone  2 spray Each Nare Daily  . levETIRAcetam  500 mg Intravenous Q12H  . olopatadine  1 drop Both Eyes BID  . pantoprazole  40 mg Oral Daily  . prochlorperazine  5 mg Oral TID AC & HS  . sodium chloride  3 mL Intravenous Q12H  . traMADol  100 mg Oral 4 times per day  . traZODone  100 mg Oral QHS   Continuous Infusions: . sodium chloride 50 mL/hr at 03/03/14 2204

## 2014-03-04 NOTE — Progress Notes (Signed)
Department of Radiation Oncology  Phone:  [phone] Fax:        [phone]  Inpatient   Weekly Treatment Note    Name: Carrie Berry Date: 03/04/2014 MRN: [id number] DOB: Apr 11, 1951   Current dose: 12.5 Gy  Current fraction: 5   MEDICATIONS: No current facility-administered medications for this encounter.   No current outpatient prescriptions on file.   Facility-Administered Medications Ordered in Other Encounters  Medication Dose Route Frequency Provider Last Rate Last Dose  . 0.9 %  sodium chloride infusion   Intravenous Continuous Robbie Lis, MD 50 mL/hr at 03/03/14 2204    . acetaminophen (TYLENOL) tablet 650 mg  650 mg Oral Q6H PRN Merton Border, MD   650 mg at 02/28/14 0947   Or  . acetaminophen (TYLENOL) suppository 650 mg  650 mg Rectal Q6H PRN Merton Border, MD      . bisacodyl (DULCOLAX) suppository 10 mg  10 mg Rectal Daily PRN Robbie Lis, MD      . dexamethasone (DECADRON) tablet 8 mg  8 mg Oral Q12H Chauncey Cruel, MD   8 mg at 03/04/14 1046  . docusate sodium (COLACE) capsule 100 mg  100 mg Oral Daily Chauncey Cruel, MD   100 mg at 03/04/14 1046  . fluticasone (FLONASE) 50 MCG/ACT nasal spray 2 spray  2 spray Each Nare Daily Merton Border, MD   2 spray at 03/04/14 1046  . levETIRAcetam (KEPPRA) IVPB 500 mg/100 mL premix  500 mg Intravenous Q12H Merton Border, MD   500 mg at 03/04/14 1046  . LORazepam (ATIVAN) tablet 0.5 mg  0.5 mg Oral Q6H PRN Chauncey Cruel, MD      . olopatadine (PATANOL) 0.1 % ophthalmic solution 1 drop  1 drop Both Eyes BID Merton Border, MD   1 drop at 03/03/14 2207  . pantoprazole (PROTONIX) EC tablet 40 mg  40 mg Oral Daily Osa Craver Milton, RPH   40 mg at 03/04/14 1046  . polyethylene glycol (MIRALAX / GLYCOLAX) packet 17 g  17 g Oral Daily PRN Chauncey Cruel, MD      . prochlorperazine (COMPAZINE) tablet 10 mg  10 mg Oral Q6H PRN Chauncey Cruel, MD      . prochlorperazine (COMPAZINE) tablet 5 mg  5 mg Oral TID AC &  HS Robbie Lis, MD   5 mg at 03/04/14 1231  . sodium chloride 0.9 % injection 3 mL  3 mL Intravenous Q12H Merton Border, MD   3 mL at 02/26/14 1000  . sodium chloride 0.9 % injection 3 mL  3 mL Intravenous PRN Merton Border, MD      . traMADol Veatrice Bourbon) tablet 100 mg  100 mg Oral 4 times per day Chauncey Cruel, MD   100 mg at 03/04/14 1209  . traZODone (DESYREL) tablet 100 mg  100 mg Oral QHS Merton Border, MD   100 mg at 03/03/14 2205     ALLERGIES: Ondansetron hcl; Codeine; and Penicillins   LABORATORY DATA:  Lab Results  Component Value Date   WBC 12.6* 03/03/2014   HGB 12.1 03/03/2014   HCT 34.4* 03/03/2014   MCV 98.9 03/03/2014   PLT 154 03/03/2014   Lab Results  Component Value Date   NA 141 03/03/2014   K 4.1 03/03/2014   CL 103 03/03/2014   CO2 28 03/03/2014   Lab Results  Component Value Date   ALT 19 02/25/2014   AST 24 02/25/2014  ALKPHOS 99 02/25/2014   BILITOT 0.2* 02/25/2014     NARRATIVE: Carrie Berry was seen today for weekly treatment management. The chart was checked and the patient's films were reviewed. The patient is doing well after completing her first week of radiation treatment. She remains an inpatient. She denies any difficulties so far.  PHYSICAL EXAMINATION: Alert, in no acute distress. Lying in her hospital bed     ASSESSMENT: The patient is doing satisfactorily with treatment.  PLAN: We will continue with the patient's radiation treatment as planned.

## 2014-03-04 NOTE — Progress Notes (Signed)
Chaplain follow up for continued support.

## 2014-03-04 NOTE — Progress Notes (Signed)
Carrie Berry   DOB:Jul 08, 1950   NU#:[id number]   IHK#:[id number]  Subjective: Carrie Berry is not nauseated but has very little appetite. BMs are very loose and bowel prophylaxis has been slowed. Headaches are better and controlled on tramadol. She felt very weak with PT yesterday but is looking forward to more PT today. Husband in room--he is just beginning to deal with the changes in Carrie Berry's situation, has not started to make plans for home care-- we initiated that discussion today  Objective: middle aged white woman examined in bed Filed Vitals:   03/04/14 0601  BP: 114/62  Pulse: 75  Temp: 98.3 F (36.8 C)  Resp: 16    Body mass index is 21.37 kg/(m^2).  Intake/Output Summary (Last 24 hours) at 03/04/14 0756 Last data filed at 03/04/14 0518  Gross per 24 hour  Intake    240 ml  Output    300 ml  Net    -60 ml       CBG (last 3)   Recent Labs  03/03/14 1236 03/03/14 1703 03/03/14 2046  GLUCAP 140* 106* 129*     Labs:  Lab Results  Component Value Date   WBC 12.6* 03/03/2014   HGB 12.1 03/03/2014   HCT 34.4* 03/03/2014   MCV 98.9 03/03/2014   PLT 154 03/03/2014   NEUTROABS 6.6* 02/02/2014    @LASTCHEMISTRY @  Urine Studies No results found for this basename: UACOL, UAPR, USPG, UPH, UTP, UGL, UKET, UBIL, UHGB, UNIT, UROB, ULEU, UEPI, UWBC, URBC, UBAC, CAST, CRYS, UCOM, BILUA,  in the last 72 hours  Basic Metabolic Panel:  Recent Labs Lab 02/25/14 1656 02/26/14 0436 03/02/14 0530 03/03/14 0530  NA 141 140 137 141  K 4.3 4.9 4.1 4.1  CL 101 104 100 103  CO2 27 24 27 28   GLUCOSE 131* 148* 131* 126*  BUN 11 9 15 16   CREATININE 0.82 0.76 0.57 0.57  CALCIUM 9.5 9.4 8.6 8.6   GFR Estimated Creatinine Clearance: 62.5 ml/min (by C-G formula based on Cr of 0.57). Liver Function Tests:  Recent Labs Lab 02/25/14 1656  AST 24  ALT 19  ALKPHOS 99  BILITOT 0.2*  PROT 7.2  ALBUMIN 3.6   No results found for this basename: LIPASE, AMYLASE,  in the last 168 hours No  results found for this basename: AMMONIA,  in the last 168 hours Coagulation profile No results found for this basename: INR, PROTIME,  in the last 168 hours  CBC:  Recent Labs Lab 02/25/14 1656 02/26/14 0436 03/02/14 0530 03/03/14 0530  WBC 8.9 7.4 10.6* 12.6*  HGB 12.3 12.0 11.3* 12.1  HCT 35.3* 34.4* 32.8* 34.4*  MCV 98.3 98.9 99.1 98.9  PLT 233 201 146* 154   Cardiac Enzymes: No results found for this basename: CKTOTAL, CKMB, CKMBINDEX, TROPONINI,  in the last 168 hours BNP: No components found with this basename: POCBNP,  CBG:  Recent Labs Lab 03/02/14 1718 03/03/14 0721 03/03/14 1236 03/03/14 1703 03/03/14 2046  GLUCAP 96 117* 140* 106* 129*   D-Dimer No results found for this basename: DDIMER,  in the last 72 hours Hgb A1c No results found for this basename: HGBA1C,  in the last 72 hours Lipid Profile No results found for this basename: CHOL, HDL, LDLCALC, TRIG, CHOLHDL, LDLDIRECT,  in the last 72 hours Thyroid function studies No results found for this basename: TSH, T4TOTAL, FREET3, T3FREE, THYROIDAB,  in the last 72 hours Anemia work up No results found for this basename: VITAMINB12, FOLATE, FERRITIN,  TIBC, IRON, RETICCTPCT,  in the last 72 hours Microbiology No results found for this or any previous visit (from the past 240 hour(s)).    Studies:  Dg Chest 2 View  02/25/2014   CLINICAL DATA:  63 year old female with right mastectomy 1 week prior.  EXAM: CHEST - 2 VIEW  COMPARISON:  06/24/2013  FINDINGS: Cardiomediastinal silhouette unchanged in size and contour.  Unchanged position of right IJ port catheter with the tip terminating in the superior vena cava.  Surgical changes of right mastectomy with surgical clips overlying right hemi thorax.  As was mentioned on prior plain film, pulmonary nodules are difficult to resolve with plain film imaging. These are better characterized on prior CT. No confluent airspace disease, pneumothorax, or pleural effusion.   No displaced fracture.  IMPRESSION: No radiographic evidence of acute cardiopulmonary disease.  Unchanged position of right IJ port catheter.  Interval surgical changes of right mastectomy.  Signed,  Dulcy Fanny. Earleen Newport, DO  [facility]  [facility]   Electronically Signed   By: Corrie Mckusick D.O.   On: 02/25/2014 19:56   Ct Head Wo Contrast  02/25/2014   CLINICAL DATA:  63 year old female with right mastectomy 1 week prior.  EXAM: CT HEAD WITHOUT CONTRAST  TECHNIQUE: Contiguous axial images were obtained from the base of the skull through the vertex without intravenous contrast.  COMPARISON:  MRI 07/15/2013  FINDINGS: Unremarkable appearance of the calvarium with no displaced fracture or aggressive lesion.  Trace fluid within the right mastoid air cells. Left mastoid air cells are clear.  No significant paranasal sinus disease.  Unremarkable appearance of the bilateral orbits.  Geographic regions of hypodensity within the white matter the bilateral hemispheres. On the right this is predominantly temporoparietal, though it extends into the occipital lobe on the right and there is a focal region in the right frontal white matter. This edema contributes to mass effect and subtle midline shift measuring approximately 11 mm at the foramen of Monro.  There is a mixed density rounded lesion measuring 13 mm within the right parietal lobe (image 17), as well as a smaller hyperdensity in the right occipital lobe (image 16.)  To a lesser degree there is confluent hypodensity within the left posterior frontal lobe white matter as well as the left high parietal white matter.  Focal hyperdensity near the anterior limb of the right internal capsule (image 13).  Heterogeneous focus in the right cerebellar hemisphere (image 7).  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 The right-sided edema is  worse, and contributes to 1 cm of right to left midline shift at the foramen of Monro. Further evaluation with contrast MRI is recommended.  These results were called by telephone at the time of interpretation on 02/25/2014 at 8:30 pm to Dr. Davonna Belling , who verbally acknowledged these results.  Signed,  Dulcy Fanny. Earleen Newport, DO  [facility]  [facility]   Electronically Signed   By: Corrie Mckusick D.O.   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 Status post palliative mastectomy 02/17/2014. Back pain on the left with nausea.  EXAM: CT CHEST, ABDOMEN, AND PELVIS WITH CONTRAST  TECHNIQUE: Multidetector CT imaging of the chest, abdomen and pelvis was performed following the standard protocol during bolus administration  of intravenous contrast.  CONTRAST:  120mL OMNIPAQUE IOHEXOL 300 MG/ML  SOLN  COMPARISON:  Chest CT and abdomen scan 07/15/2013. PET CT scan 01/07/2013.  FINDINGS: CT CHEST FINDINGS  Since the prior chest CT, the patient has undergone right mastectomy. There is a small amount of gas the soft tissues from the procedure and surgical staples are in place. A small amount of simple appearing fluid is also identified.  A right axillary lymph node measuring 0.6 cm is seen. There is no fatty hilum within the node. The node is seen on the prior study, but had a clearly visible fatty hilum. Tiny right paratracheal node seen on the prior examination is unchanged. No pathologically enlarged lymph nodes are seen in the hila or mediastinum. There is no pleural or pericardial effusion. Heart size is upper normal.  There has been progression of pulmonary metastases. A new left lower lobe pulmonary nodule on image 30 measures 0.8 x 0.6 cm. A new left upper lobe nodule on image 22 measures 0.4 cm. Right upper lobe pulmonary nodule which had  measured 0.7 x 0.5 cm today measures 0.8 x 0.6 cm on image 20. Tiny subpleural nodule along the major fissure on the left seen on the prior study is unchanged on image 16. No focal bony abnormality is identified.  CT ABDOMEN AND PELVIS FINDINGS  Previously seen 0.9 cm low attenuating lesion the right hepatic lobe today measures 0.5 cm. Lesion in the left hepatic lobe which had measured 0.6 cm today measures 1.5 cm on image 58. Additional liver lesions do not appear markedly changed. No new liver lesion is identified.  The adrenal glands, spleen, gallbladder, biliary tree and pancreas appear normal. Mild prominence of the left renal pelvis seen on the prior study appear slightly increased. Abnormal axis orientation of the right kidney is noted. Small amount of free pelvic fluid is present. Urinary bladder, uterus adnexa are unremarkable. The stomach and small and large bowel appear normal. No lymphadenopathy is identified. No sclerotic lesion is seen.  IMPRESSION: Interval progression of pulmonary and hepatic metastases as detailed above.  Status post right mastectomy without complicating feature.  Trace amount of free pelvic fluid of uncertain etiology.   Electronically Signed   By: Inge Rise M.D.   On: 02/28/2014 12:20   Mr Jeri Cos YT Contrast  02/26/2014   CLINICAL DATA:  Brain metastases.  Stage IV breast cancer.  EXAM: MRI HEAD WITHOUT AND WITH CONTRAST  TECHNIQUE: Multiplanar, multiecho pulse sequences of the brain and surrounding structures were obtained without and with intravenous contrast.  CONTRAST:  87mL MULTIHANCE GADOBENATE DIMEGLUMINE 529 MG/ML IV SOLN  COMPARISON:  CT head without contrast 02/25/2014. MRI brain 07/15/2013.  FINDINGS: Multiple enhancing mass lesions are present bilaterally, more prominent right than left. Peripheral restricted diffusion is evident within multiple lesions.  A right cerebellar lesion measures 16 x 18 mm with significant surrounding edema.  A 5 mm left temporal  lobe lesion is present on image 11. Bilateral occipital lobe lesions are present on image 12. The lesion on the left measures 6.5 x 8.0 mm lesion on the right measures 3 mm. The posterior right temporal lobe lesion on image 13 measures 15 x 12 mm. A posterior left frontal lobe lesion measures 18 x 21 x 18 mm. A right parietal lesion on image 15 measures 6 mm. Additional lesions in the right frontal and right parietal lobe are contained within an an area of confluent T2 hyperintensity in the right frontal and  parietal lobe. A more anterior right frontal lobe lesion measures 6 mm on image 19. A high posterior left frontal lobe lesion measures 5.5 mm with surrounding edema. A more inferior left frontal lobe lesion measures 5.5 mm on image 21. And 6.5 mm lesion is present in the left parietal lobe. A peripheral smaller left parietal lesion is present on image 19. A punctate lesion is present within the left lentiform nucleus.  The vasogenic edema results in significant midline shift from the left to the right measuring 10 mm at the foramen of Monro. The basal cisterns are intact.  Flow is present in the major intracranial arteries. The globes and orbits are intact. The paranasal sinuses are clear. There is some fluid in the mastoid air cells bilaterally, left greater than right.  IMPRESSION: 1. Multiple enhancing mass lesions scattered throughout the brain. The largest concentration is in the posterior right frontal and right parietal lobe. 2. The largest lesion is in the posterior right frontal lobe, measuring 18 x 21 x 18 mm. 3. A more lateral right occipital lesion measures 17 x 15 x 12 mm. 4. High posterior left frontal lobe measuring 5.5 mm. 5. 18 mm right cerebellar lesion. 6. Significant midline shift measures 5 mm at the foramen of Monro.   Electronically Signed   By: Lawrence Santiago M.D.   On: 02/26/2014 15:38   Ct Abdomen Pelvis W Contrast  02/28/2014   CLINICAL DATA:  History of invasive duct carcinoma the  right breast with metastatic disease to the lungs and liver. Status post palliative mastectomy 02/17/2014. Back pain on the left with nausea.  EXAM: CT CHEST, ABDOMEN, AND PELVIS WITH CONTRAST  TECHNIQUE: Multidetector CT imaging of the chest, abdomen and pelvis was performed following the standard protocol during bolus administration of intravenous contrast.  CONTRAST:  140mL OMNIPAQUE IOHEXOL 300 MG/ML  SOLN  COMPARISON:  Chest CT and abdomen scan 07/15/2013. PET CT scan 01/07/2013.  FINDINGS: CT CHEST FINDINGS  Since the prior chest CT, the patient has undergone right mastectomy. There is a small amount of gas the soft tissues from the procedure and surgical staples are in place. A small amount of simple appearing fluid is also identified.  A right axillary lymph node measuring 0.6 cm is seen. There is no fatty hilum within the node. The node is seen on the prior study, but had a clearly visible fatty hilum. Tiny right paratracheal node seen on the prior examination is unchanged. No pathologically enlarged lymph nodes are seen in the hila or mediastinum. There is no pleural or pericardial effusion. Heart size is upper normal.  There has been progression of pulmonary metastases. A new left lower lobe pulmonary nodule on image 30 measures 0.8 x 0.6 cm. A new left upper lobe nodule on image 22 measures 0.4 cm. Right upper lobe pulmonary nodule which had measured 0.7 x 0.5 cm today measures 0.8 x 0.6 cm on image 20. Tiny subpleural nodule along the major fissure on the left seen on the prior study is unchanged on image 16. No focal bony abnormality is identified.  CT ABDOMEN AND PELVIS FINDINGS  Previously seen 0.9 cm low attenuating lesion the right hepatic lobe today measures 0.5 cm. Lesion in the left hepatic lobe which had measured 0.6 cm today measures 1.5 cm on image 58. Additional liver lesions do not appear markedly changed. No new liver lesion is identified.  The adrenal glands, spleen, gallbladder, biliary  tree and pancreas appear normal. Mild prominence of the  left renal pelvis seen on the prior study appear slightly increased. Abnormal axis orientation of the right kidney is noted. Small amount of free pelvic fluid is present. Urinary bladder, uterus adnexa are unremarkable. The stomach and small and large bowel appear normal. No lymphadenopathy is identified. No sclerotic lesion is seen.  IMPRESSION: Interval progression of pulmonary and hepatic metastases as detailed above.  Status post right mastectomy without complicating feature.  Trace amount of free pelvic fluid of uncertain etiology.   Electronically Signed   By: Inge Rise M.D.   On: 02/28/2014 12:20   Assessment: 63 y.o. Carrie Berry, Carrie Berry woman with a history of breast cancer metastatic at presentation August 2014, with lung and liver involvement, treated as summarized below, admitted 02/25/2014 with nausea, vomiting, headaches, and found to have multiple bilateral brain metastases.   TREATMENT HISTORY  (0) status post right breast upper outer quadrant and right axillary lymph node biopsy 12/23/2012 for a clinical T3 N1 M1, stage IV invasive ductal carcinoma, grade 3, triple negative, with an MIB-1 of 86%.  (1) right liver lobe biopsy 01/21/2013 confirms metastatic adenocarcinoma; scans show involvement of the liver, lungs, and likely bone.  (2) enrolled in [facility] study [id number] (docetaxel + oral gamma secretase inibitor [id number]), received one cycle starting 02/04/2013 but withdrew because of poor tolerance despite treatment interruptions and decreased dosing of the oral component  (3) chest CT in early November 2014 was compared with studies in August 2014 and showed interval progression of metastatic breast cancer, with an enlarging primary right breast mass, enlarging right axillary lymph nodes, enlarging pulmonary nodules and new/enlarging hepatic metastases.  (4) Abraxane started 03/19/2013, given day 1 and day 8 of each 21 day cycle, stopped  with the day 1 cycle 3 dose (04/29/2013) because of local progression of disease  (5) cyclophosphamide and doxorubicin started 05/20/2013, given in dose dense fashion with Neulasta support on day 2. Completed 4 cycles 07/03/2013, with the final cycle dose reduced 15% because of an episode of febrile neutropenia after cycle 3. Restaging studies 07/15/2013 showed partial response.  (6) started capecitabine April 2015, 1.5 g by mouth twice a day, 7 days on 7 days off, last dose 02/08/2014 -- discontinued with evidence of disease progression  (7) s/p Right simple mastectomy 02/17/2014 to optimize local control, the pathology ([id number]) showing an invasive ductal carcinoma measuring 4.8 cm, grade 3, with close but negative margins and evidence of treatment response  (8) multiple brain metastases noted on brain MRI 02/26/2014--started whole brain irradiation 02/28/2014    Plan: Long discussion this AM with patient and husband; started to look towards eventual discharge--what will they need at home, who will be staying with Carrie Berry when her husband gets back to work. Clarified advanced directives/ HCPOA for now. Accordingly  (a) will further simplify meds and change IVs to po as much as possible so patient can start on her home meds now--that will let us know if any changes need to be made prior to d/c  (b) at a minimum she will need a walker and home PT, but PT/OT eval in process; patient will need someone to stay with her and we started discussing who those friends and family members might be so Carrie Berry can get back to work  (c) they understand Carrie Berry is automatically Carrie Berry and no paperwork is needed regarding that. We briefly discussed what Carrie Berry would want done "if her heart stopped" and at this point she wishes to remain a full code. We will address  this question again once she completes radiation and we discuss further chemo vs. palliative care;would not press the issue further at this point  (d) my plan  is to wait 2 months post XRT and repeat a brain MRI-- not planning chemo before then  Appreciate your excellent care to this patient!   Chauncey Cruel, MD 03/04/2014  7:56 AM

## 2014-03-05 ENCOUNTER — Ambulatory Visit
Admit: 2014-03-05 | Discharge: 2014-03-05 | Disposition: A | Discharge status: Home / Self Care | Payer: BC Managed Care – PPO | Attending: Radiation Oncology | Admitting: Radiation Oncology

## 2014-03-05 NOTE — Progress Notes (Signed)
Patient ID: Carrie Berry, female   DOB: Feb 18, 1951, 63 y.o.   MRN: [id number] [facility] PROGRESS NOTE  Carrie Berry EXB:[id number] DOB: May 06, 1951 DOA: 02/25/2014 PCP: [name], MD  Brief narrative: 63 y.o. 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 Patient presented 02/25/2014 with complaints of severe pounding headache in the back and on the left side associated with nausea. Patient also reported decreased fine motor skills. On admission, CT head and MRI brain are both significant for multiple brain metastases, the largest concentration in the posterior right frontal and right parietal lobe. She also has a right occipital lesion, left frontal lobe lesion, cerebellar lesion and midline shift at the foramen of [place]. Pt started RT on 02/27/2014. Patient feels little better this morning but still reports feeling unsteady when she get up and tries to walk.   Assessment/Plan:   Principal Problem:  Headache / brain metastases and vasogenic edema / midline shift  Headaches likely due to brain metastases, vasogenic edema and midline shift. No seizures.  Started RT 02/27/2014. Continue RT as planned. Today, 10/22/215 patient will receive total of 6 RT.  Headache better. Still unsteady when ambulates. Had an episode of fall on the night of 03/03/2014. No falls since then. Continue decadron 8 mg IV every 6 hours.  Continue keppra for seizure prophylaxis.  Diet as tolerated.  Active Problems:  Leukocytosis  Likely from Decadron. No evidence of infection.  History of breast cancer with lung and liver metastases  Brain metastases from metastatic breast cancer: Currently RT as planned.  Per CT scan, one of the liver metastases have increased in size the other one had shrunk in size, the remainder of  the nodules in the liver are stable. Consistent with progression of disease.  DVT Prophylaxis  SCD's bilaterally. GI prophylaxis  Protonix for GI prophylaxis.  Code Status: Full.  Family Communication: plan of care discussed with the patient and her daughter at the bedside.  Disposition Plan: Home when stable.    IV Access:   PAC  Peripheral IV line Procedures and diagnostic studies:   Dg Chest 2 View 02/25/2014 No radiographic evidence of acute cardiopulmonary disease. Unchanged position of right IJ port catheter. Interval surgical changes of right mastectomy.  Ct Head Wo Contrast 02/25/2014 Evidence of multiple brain metastases of the supratentorial and infratentorial brain, with associated vasogenic edema. The right-sided edema is worse, and contributes to 1 cm of right to left midline shift at the foramen of [place]. Further evaluation with contrast MRI is recommended.  Mr [name] Contrast 02/26/2014 1. Multiple enhancing mass lesions scattered throughout the brain. The largest concentration is in the posterior right frontal and right parietal lobe. 2. The largest lesion is in the posterior right frontal lobe, measuring 18 x 21 x 18 mm. 3. A more lateral right occipital lesion measures 17 x 15 x 12 mm. 4. High posterior left frontal lobe measuring 5.5 mm. 5. 18 mm right cerebellar lesion. 6. Significant midline shift measures 5 mm at the foramen of [place].  Ct Chest W Contrast 02/28/2014 Interval progression of pulmonary and hepatic metastases Status post right mastectomy without complicating feature. Trace amount of free pelvic fluid of uncertain etiology.  Ct Abdomen Pelvis W Contrast 02/28/2014 Interval progression of pulmonary and hepatic metastases. Status post right mastectomy without complicating feature. Trace amount  of free pelvic fluid of uncertain etiology.  Medical Consultants:   Dr. [name] (Oncology)  Dr. [name] (Radiation oncology)  Dr. [name]  (Surgery) Other Consultants:   None  Anti-Infectives:   None    Carrie Lenz, MD  [facility] Pager [phone]  If 7PM-7AM, please contact night-coverage [email or web address] Password [email or web address] 03/05/2014, 12:02 PM   LOS: 8 days    HPI/Subjective: No acute overnight events.  Objective: Filed Vitals:   03/04/14 0601 03/04/14 1310 03/04/14 2053 03/05/14 0531  BP: 114/62 119/71 116/65 114/78  Pulse: 75 72 75 75  Temp: 98.3 F (36.8 C) 98.3 F (36.8 C) 97.8 F (36.6 C) 98.2 F (36.8 C)  TempSrc: Oral Oral Oral Oral  Resp: 16 16 16 16   Height:      Weight:      SpO2: 98% 99% 95% 98%    Intake/Output Summary (Last 24 hours) at 03/05/14 1202 Last data filed at 03/04/14 1401  Gross per 24 hour  Intake 400.83 ml  Output      0 ml  Net 400.83 ml    Exam:   General:  Pt is alert, follows commands appropriately, not in acute distress  Cardiovascular: Regular rate and rhythm, S1/S2, no murmurs  Respiratory: Clear to auscultation bilaterally, no wheezing, no crackles, no rhonchi  Abdomen: Soft, non tender, non distended, bowel sounds present  Extremities: No edema, pulses DP and PT palpable bilaterally  Neuro: Grossly nonfocal  Data Reviewed: Basic Metabolic Panel:  Recent Labs Lab 03/02/14 0530 03/03/14 0530  NA 137 141  K 4.1 4.1  CL 100 103  CO2 27 28  GLUCOSE 131* 126*  BUN 15 16  CREATININE 0.57 0.57  CALCIUM 8.6 8.6   Liver Function Tests: No results found for this basename: AST, ALT, ALKPHOS, BILITOT, PROT, ALBUMIN,  in the last 168 hours No results found for this basename: LIPASE, AMYLASE,  in the last 168 hours No results found for this basename: AMMONIA,  in the last 168 hours CBC:  Recent Labs Lab 03/02/14 0530 03/03/14 0530  WBC 10.6* 12.6*  HGB 11.3* 12.1  HCT 32.8* 34.4*  MCV 99.1 98.9  PLT 146* 154   Cardiac Enzymes: No results found for this basename: CKTOTAL, CKMB, CKMBINDEX, TROPONINI,  in the last 168 hours BNP: No  components found with this basename: POCBNP,  CBG:  Recent Labs Lab 03/03/14 1236 03/03/14 1703 03/03/14 2046 03/04/14 0759 03/04/14 1205  GLUCAP 140* 106* 129* 115* 99    No results found for this or any previous visit (from the past 240 hour(s)).   Scheduled Meds: . dexamethasone  8 mg Oral Q12H  . docusate sodium  100 mg Oral Daily  . fluticasone  2 spray Each Nare Daily  . levETIRAcetam  500 mg Intravenous Q12H  . olopatadine  1 drop Both Eyes BID  . pantoprazole  40 mg Oral Daily  . prochlorperazine  5 mg Oral TID AC & HS  . sodium chloride  3 mL Intravenous Q12H  . traMADol  100 mg Oral 4 times per day  . traZODone  100 mg Oral QHS   Continuous Infusions: . sodium chloride 1,000 mL (03/04/14 1702)

## 2014-03-05 NOTE — Progress Notes (Signed)
OT Cancellation Note  Patient Details Name: Carrie Berry MRN: [id number] DOB: 1950-09-17   Cancelled Treatment:    Reason Eval/Treat Not Completed: Patient at procedure or test/ unavailable - Pt working with PT.  Will reattempt.   Darlina Rumpf [place], OTR/L [phone]  03/05/2014, 3:38 PM

## 2014-03-05 NOTE — Progress Notes (Signed)
This encounter was created in error - please disregard.

## 2014-03-05 NOTE — Progress Notes (Signed)
[facility] Dept Therapy Treatment Record Phone [phone]   Radiation Therapy was administered to Carrie Berry on: 03/05/2014  11:54 AM and was treatment # 6 out of a planned course of 14* treatments.

## 2014-03-06 ENCOUNTER — Ambulatory Visit: Payer: BC Managed Care – PPO | Admitting: Radiation Oncology

## 2014-03-06 ENCOUNTER — Ambulatory Visit
Admit: 2014-03-06 | Discharge: 2014-03-06 | Disposition: A | Discharge status: Home / Self Care | Payer: BC Managed Care – PPO | Attending: Radiation Oncology | Admitting: Radiation Oncology

## 2014-03-06 DIAGNOSIS — C50911 Malignant neoplasm of unspecified site of right female breast: Secondary | ICD-10-CM

## 2014-03-06 DIAGNOSIS — C799 Secondary malignant neoplasm of unspecified site: Secondary | ICD-10-CM

## 2014-03-06 MED ORDER — LEVETIRACETAM 500 MG PO TABS
500.0000 mg | ORAL_TABLET | Freq: Two times a day (BID) | ORAL | Status: DC
Start: 2014-03-06 — End: 2014-03-09
  Administered 2014-03-06 – 2014-03-09 (×7): 500 mg via ORAL
  Filled 2014-03-06 (×8): qty 1

## 2014-03-06 NOTE — Plan of Care (Signed)
Problem: Acute Rehab OT Goals (only OT should resolve) Goal: Pt/Caregiver Will Perform Home Exercise Program Outcome: Not Applicable Date Met:  32/02/33 Discontinue goal

## 2014-03-06 NOTE — Progress Notes (Signed)
[facility] Dept Therapy Treatment Record Phone [phone]   Radiation Therapy was administered to Carrie Berry on: 03/06/2014  9:41 AM and was treatment # 7 out of a planned course of 14 treatments.

## 2014-03-06 NOTE — Progress Notes (Signed)
PT Cancellation Note  ___Treatment cancelled today due to medical issues with patient which prohibited therapy  ___ Treatment cancelled today due to patient receiving procedure or test   ___ Treatment cancelled today due to patient's refusal to participate   _X_ Treatment cancelled today due to pt unable to tolerate Physical Therapy after her OT session.  Will check back as schedule permits.     Rica Koyanagi  PTA [facility]  Acute  Rehab Pager      [phone]

## 2014-03-06 NOTE — Progress Notes (Signed)
PT Cancellation Note  ___Treatment cancelled today due to medical issues with patient which prohibited therapy  ___ Treatment cancelled today due to patient receiving procedure or test   ___ Treatment cancelled today due to patient's refusal to participate   _X_ Treatment cancelled today due to pt not feeling well.  Curled up in bed in fetal position visibly ill.  Reported to RN that pt was just not up for it.  Rica Koyanagi  PTA [facility]  Acute  Rehab Pager      [phone]

## 2014-03-06 NOTE — Progress Notes (Signed)
Occupational Therapy Treatment Patient Details Name: Carrie Berry MRN: [id number] DOB: 05/16/1950 Today's Date: 03/06/2014    History of present illness 63 yo female admitted with brain mets. Sustained fall 10/20 during hospital stay. Hx of breast cancer, R mastectomy 02/17/14, mets to liver, lung   OT comments  Pt very motivated.  Checked coordination--no difficulties during this session.  Balance/endurance are main problems.  Follow Up Recommendations  Home health OT;Supervision/Assistance - 24 hour    Equipment Recommendations  3 in 1 bedside comode    Recommendations for Other Services      Precautions / Restrictions Precautions Precautions: Fall Restrictions Weight Bearing Restrictions: No       Mobility Bed Mobility         Supine to sit: Supervision Sit to supine: Supervision   General bed mobility comments: for safety  Transfers   Equipment used: Rolling walker (2 wheeled);1 person hand held assist Transfers: Stand Pivot Transfers;Sit to/from Stand Sit to Stand: Min guard Stand pivot transfers: Min guard       General transfer comment: SPT holding commode; worked on standing balance with RW    Balance   Sitting-balance support: Feet supported Sitting balance-Leahy Scale: Fair     Standing balance support: Single extremity supported Standing balance-Leahy Scale: Poor                     ADL Overall ADL's : Needs assistance/impaired                         Toilet Transfer: Minimal assistance;Stand-pivot;BSC   Toileting- Clothing Manipulation and Hygiene: Set up;Supervision/safety;Sitting/lateral lean         General ADL Comments: pt used commode and worked on standing balance with RW releasing one hand at a time.  Pt fatiques easily and rest breaks given between activities.  Reassessed coordination/intrinsics.  Pt reports more coordination difficulty with LUE.  when observing coordination, pt tends to use first and third  digits on R hand.  Able to pick up small objects, tie a bow when strings placed in front of her, and close snaps.  intrinsic muscles wfls.  Educated about energy conservation.  Pt can use her husband's shower with seat.  She will benefit from 3:1 commode      Vision                     Perception     Praxis      Cognition   Behavior During Therapy: WFL for tasks assessed/performed Overall Cognitive Status: Within Functional Limits for tasks assessed                       Extremity/Trunk Assessment               Exercises     Shoulder Instructions       General Comments      Pertinent Vitals/ Pain       Pain Assessment: No/denies pain  Home Living                                          Prior Functioning/Environment              Frequency Min 2X/week     Progress Toward Goals  OT Goals(current goals can now be found in the  care plan section)  Progress towards OT goals: Progressing toward goals     Plan      Co-evaluation                 End of Session     Activity Tolerance Patient tolerated treatment well   Patient Left in bed;with call bell/phone within reach;with bed alarm set;with family/visitor present   Nurse Communication          Time: 2707-8675 OT Time Calculation (min): 24 min  Charges: OT General Charges $OT Visit: 1 Procedure OT Treatments $Self Care/Home Management : 8-22 mins $Therapeutic Activity: 8-22 mins  Lovie Agresta 03/06/2014, 2:21 PM  Lesle Chris, OTR/L [phone] 03/06/2014

## 2014-03-06 NOTE — Progress Notes (Signed)
Progress Note   Carrie Berry LTJ:[id number] DOB: 10-31-1950 DOA: 02/25/2014 PCP: [name], MD   Brief Narrative:   Carrie Berry is an 63 y.o. 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 Patient presented 02/25/2014 with complaints of severe pounding headache in the back and on the left side associated with nausea. Patient also reported decreased fine motor skills. On admission, CT head and MRI brain are both significant for multiple brain metastases, the largest concentration in the posterior right frontal and right parietal lobe. She also has a right occipital lesion, left frontal lobe lesion, cerebellar lesion and midline shift at the foramen of [place]. Pt started RT on 02/27/2014. Patient feels little better this morning but still reports feeling unsteady when she get up and tries to walk.   Assessment/Plan:   Principal Problem:  Headache / brain metastases and vasogenic edema / midline shift   Headaches likely due to brain metastases, vasogenic edema and midline shift. No seizures.   Started RT 02/27/2014. Continue RT as planned.   Had an episode of fall on the night of 03/03/2014. No falls since then, but continues to feel weak/unsteady.   Continue decadron 8 mg PO Q 12.   Continue keppra for seizure prophylaxis.   Multiple PT/OT evaluations cancelled.  Mobilize.  Active Problems:  Leukocytosis   Likely from Decadron. No evidence of infection.   History of breast cancer with lung and liver metastases   Brain metastases from metastatic breast cancer: Currently RT as planned.   Per CT scan, one of the liver metastases has increased in size the other one has shrunk in size, the remainder of the nodules in the liver are stable. Consistent with progression of disease.   DVT  Prophylaxis   SCD's bilaterally.  GI prophylaxis   Protonix for GI prophylaxis given steroid use.  Code Status: Full.  Family Communication: Plan of care discussed with the patient and her husband at the bedside.  Disposition Plan: Home when stable.   IV Access:    Porta-Cath   Procedures and diagnostic studies:   Dg Chest 2 View 02/25/2014 No radiographic evidence of acute cardiopulmonary disease. Unchanged position of right IJ port catheter. Interval surgical changes of right mastectomy.   Ct Head Wo Contrast 02/25/2014 Evidence of multiple brain metastases of the supratentorial and infratentorial brain, with associated vasogenic edema. The right-sided edema is worse, and contributes to 1 cm of right to left midline shift at the foramen of [place]. Further evaluation with contrast MRI is recommended.   Mr [name] Contrast 02/26/2014 1. Multiple enhancing mass lesions scattered throughout the brain. The largest concentration is in the posterior right frontal and right parietal lobe. 2. The largest lesion is in the posterior right frontal lobe, measuring 18 x 21 x 18 mm. 3. A more lateral right occipital lesion measures 17 x 15 x 12 mm. 4. High posterior left frontal lobe measuring 5.5 mm. 5. 18 mm right cerebellar lesion. 6. Significant midline shift measures 5 mm at the foramen of [place].   Ct Chest W Contrast 02/28/2014 Interval progression of pulmonary and hepatic metastases Status post right mastectomy without complicating feature. Trace amount of free pelvic fluid of uncertain etiology.   Ct Abdomen Pelvis W Contrast 02/28/2014 Interval progression of pulmonary and hepatic metastases. Status post right  mastectomy without complicating feature. Trace amount of free pelvic fluid of uncertain etiology.   Medical Consultants:    Dr. [name] (Oncology)   Dr. [name] (Radiation oncology)   Dr. [name] (Surgery)  Anti-Infectives:    None.  Subjective:    Carrie Berry feels fatigued with a slight headache.  Appetite OK.  Having up to 3 loose stools, small volume, a day.  No nausea or vomiting.  No fevers.  Does not feel strong enough to go home.  Feels like her muscles are weak.    Objective:    Filed Vitals:   03/04/14 1310 03/04/14 2053 03/05/14 0531 03/05/14 2107  BP: 119/71 116/65 114/78 128/80  Pulse: 72 75 75 72  Temp: 98.3 F (36.8 C) 97.8 F (36.6 C) 98.2 F (36.8 C) 98.7 F (37.1 C)  TempSrc: Oral Oral Oral Oral  Resp: 16 16 16 16   Height:      Weight:      SpO2: 99% 95% 98% 98%    Intake/Output Summary (Last 24 hours) at 03/06/14 4098 Last data filed at 03/05/14 1400  Gross per 24 hour  Intake      0 ml  Output    301 ml  Net   -301 ml    Exam: Gen:  NAD Cardiovascular:  RRR, No M/R/G Respiratory:  Lungs CTAB Gastrointestinal:  Abdomen soft, NT/ND, + BS Extremities:  No C/E/C Neuro: Slight right sided weakness compared to left.   Data Reviewed:    Labs: Basic Metabolic Panel:  Recent Labs Lab 03/02/14 0530 03/03/14 0530  NA 137 141  K 4.1 4.1  CL 100 103  CO2 27 28  GLUCOSE 131* 126*  BUN 15 16  CREATININE 0.57 0.57  CALCIUM 8.6 8.6   GFR Estimated Creatinine Clearance: 62.5 ml/min (by C-G formula based on Cr of 0.57).  CBC:  Recent Labs Lab 03/02/14 0530 03/03/14 0530  WBC 10.6* 12.6*  HGB 11.3* 12.1  HCT 32.8* 34.4*  MCV 99.1 98.9  PLT 146* 154   CBG:  Recent Labs Lab 03/03/14 1236 03/03/14 1703 03/03/14 2046 03/04/14 0759 03/04/14 1205  GLUCAP 140* 106* 129* 115* 99   Sepsis Labs:  Recent Labs Lab 03/02/14 0530 03/03/14 0530  WBC 10.6* 12.6*   Microbiology No results found for this or any previous visit (from the past 240 hour(s)).   Medications:   . dexamethasone  8 mg Oral Q12H  . docusate sodium  100 mg Oral Daily  . fluticasone  2 spray Each Nare Daily  . levETIRAcetam  500 mg Intravenous Q12H  . olopatadine  1 drop Both Eyes BID  . pantoprazole   40 mg Oral Daily  . prochlorperazine  5 mg Oral TID AC & HS  . sodium chloride  3 mL Intravenous Q12H  . traMADol  100 mg Oral 4 times per day  . traZODone  100 mg Oral QHS   Continuous Infusions: . sodium chloride 50 mL/hr at 03/05/14 1433    Time spent: 25 minutes.   LOS: 9 days   [facility] Pager [phone]. If unable to reach me by pager, please call my cell phone at [phone].  *Please refer to [email or web address], password [email or web address] to get updated schedule on who will round on this patient, as hospitalists switch teams weekly. If 7PM-7AM, please contact night-coverage at [email or web address], password [email or web address] for any overnight needs.  03/06/2014, 8:26 AM

## 2014-03-07 ENCOUNTER — Other Ambulatory Visit: Payer: Self-pay | Admitting: Oncology

## 2014-03-07 DIAGNOSIS — R531 Weakness: Secondary | ICD-10-CM

## 2014-03-07 DIAGNOSIS — R197 Diarrhea, unspecified: Secondary | ICD-10-CM | POA: Diagnosis present

## 2014-03-07 DIAGNOSIS — R1111 Vomiting without nausea: Secondary | ICD-10-CM

## 2014-03-07 DIAGNOSIS — C50919 Malignant neoplasm of unspecified site of unspecified female breast: Secondary | ICD-10-CM

## 2014-03-07 DIAGNOSIS — R5381 Other malaise: Secondary | ICD-10-CM

## 2014-03-07 LAB — BASIC METABOLIC PANEL
ANION GAP: 11 (ref 5–15)
BUN: 19 mg/dL (ref 6–23)
CHLORIDE: 98 meq/L (ref 96–112)
CO2: 25 mEq/L (ref 19–32)
Calcium: 8.6 mg/dL (ref 8.4–10.5)
Creatinine, Ser: 0.54 mg/dL (ref 0.50–1.10)
GFR calc non Af Amer: >90 mL/min (ref >90)
Glucose, Bld: 123 mg/dL — ABNORMAL HIGH (ref 70–99)
POTASSIUM: 4.5 meq/L (ref 3.7–5.3)
Sodium: 134 mEq/L — ABNORMAL LOW (ref 137–147)

## 2014-03-07 LAB — CBC
HCT: 37.9 % (ref 36.0–46.0)
HEMOGLOBIN: 13.6 g/dL (ref 12.0–15.0)
MCH: 34.4 pg — AB (ref 26.0–34.0)
MCHC: 35.9 g/dL (ref 30.0–36.0)
MCV: 95.9 fL (ref 78.0–100.0)
PLATELETS: 181 10*3/uL (ref 150–400)
RBC: 3.95 MIL/uL (ref 3.87–5.11)
RDW: 13.8 % (ref 11.5–15.5)
WBC: 13.2 10*3/uL — AB (ref 4.0–10.5)

## 2014-03-07 NOTE — Progress Notes (Addendum)
Clinical Social Work Department CLINICAL SOCIAL WORK PLACEMENT NOTE 03/07/2014  Patient:  [name], [name]  Account Number:  [id number] Admit date:  02/25/2014  Clinical Social Worker:  Ulyess Blossom  Date/time:  03/07/2014 07:00 PM  Clinical Social Work is seeking post-discharge placement for this patient at the following level of care:   SKILLED NURSING   (*CSW will update this form in Epic as items are completed)   03/07/2014  Patient/family provided with [place] Department of Clinical Social Work's list of facilities offering this level of care within the geographic area requested by the patient (or if unable, by the patient's family).  03/07/2014  Patient/family informed of their freedom to choose among providers that offer the needed level of care, that participate in Medicare, Medicaid or managed care program needed by the patient, have an available bed and are willing to accept the patient.  03/07/2014  Patient/family informed of [facility]' ownership interest in [facility], as well as of the fact that they are under no obligation to receive care at this facility.  PASARR submitted to EDS on 03/07/2014 PASARR number received on 03/07/2014  FL2 transmitted to all facilities in geographic area requested by pt/family on  03/07/2014 FL2 transmitted to all facilities within larger geographic area on   Patient informed that his/her managed care company has contracts with or will negotiate with  certain facilities, including the following:     Patient/family informed of bed offers received:  03/09/2014 Patient chooses bed at [facility] and [place] recommends and patient chooses bed at    Patient to be transferred to  on  [facility] and [place] on 03/10/2014 Patient to be transferred to facility by pt husband via private vehicle Patient and family notified of transfer on 03/10/2014 Name of family member notified:  Pt and pt husband,  Legrand Como notified at bedside  The following physician request were entered in Epic:   Additional Comments:   [name], MSW, [place] Work Weekend coverage [phone]

## 2014-03-07 NOTE — Progress Notes (Signed)
Carrie Berry   DOB:05-31-1950   AJ#:[id number]   MBT#:[id number]  Subjective: feels weak, balance is poor, nauseated though no vomiting, no appetite, food tastes "horrible," little bowel "squirts" whenever she urinates but no "good" BM; headaches better; husband in room   Objective: middle aged white woman examined in bed Filed Vitals:   03/07/14 0605  BP: 105/64  Pulse: 84  Temp: 98.2 F (36.8 C)  Resp: 16    Body mass index is 21.37 kg/(m^2).  Intake/Output Summary (Last 24 hours) at 03/07/14 0806 Last data filed at 03/07/14 0631  Gross per 24 hour  Intake 935.83 ml  Output      0 ml  Net 935.83 ml     EOMs intact  Oropharynx slightly dry, no thrush  No peripheral adenopathy  Lungs clear -- auscultated anterolaterally  Heart regular rate and rhythm  Abdomen soft, nondistended, +BS, NT  MSK no focal spinal tenderness, no peripheral edema    CBG (last 3)   Recent Labs  03/04/14 1205  GLUCAP 99     Labs:  Lab Results  Component Value Date   WBC 13.2* 03/07/2014   HGB 13.6 03/07/2014   HCT 37.9 03/07/2014   MCV 95.9 03/07/2014   PLT 181 03/07/2014   NEUTROABS 6.6* 02/02/2014    @LASTCHEMISTRY @  Urine Studies No results found for this basename: UACOL, UAPR, USPG, UPH, UTP, UGL, UKET, UBIL, UHGB, UNIT, UROB, ULEU, UEPI, UWBC, URBC, UBAC, CAST, CRYS, UCOM, BILUA,  in the last 72 hours  Basic Metabolic Panel:  Recent Labs Lab 03/02/14 0530 03/03/14 0530 03/07/14 0500  NA 137 141 134*  K 4.1 4.1 4.5  CL 100 103 98  CO2 27 28 25   GLUCOSE 131* 126* 123*  BUN 15 16 19   CREATININE 0.57 0.57 0.54  CALCIUM 8.6 8.6 8.6   GFR Estimated Creatinine Clearance: 62.5 ml/min (by C-G formula based on Cr of 0.54). Liver Function Tests: No results found for this basename: AST, ALT, ALKPHOS, BILITOT, PROT, ALBUMIN,  in the last 168 hours No results found for this basename: LIPASE, AMYLASE,  in the last 168 hours No results found for this basename: AMMONIA,  in the  last 168 hours Coagulation profile No results found for this basename: INR, PROTIME,  in the last 168 hours  CBC:  Recent Labs Lab 03/02/14 0530 03/03/14 0530 03/07/14 0500  WBC 10.6* 12.6* 13.2*  HGB 11.3* 12.1 13.6  HCT 32.8* 34.4* 37.9  MCV 99.1 98.9 95.9  PLT 146* 154 181   Cardiac Enzymes: No results found for this basename: CKTOTAL, CKMB, CKMBINDEX, TROPONINI,  in the last 168 hours BNP: No components found with this basename: POCBNP,  CBG:  Recent Labs Lab 03/03/14 1236 03/03/14 1703 03/03/14 2046 03/04/14 0759 03/04/14 1205  GLUCAP 140* 106* 129* 115* 99   D-Dimer No results found for this basename: DDIMER,  in the last 72 hours Hgb A1c No results found for this basename: HGBA1C,  in the last 72 hours Lipid Profile No results found for this basename: CHOL, HDL, LDLCALC, TRIG, CHOLHDL, LDLDIRECT,  in the last 72 hours Thyroid function studies No results found for this basename: TSH, T4TOTAL, FREET3, T3FREE, THYROIDAB,  in the last 72 hours Anemia work up No results found for this basename: VITAMINB12, FOLATE, FERRITIN, TIBC, IRON, RETICCTPCT,  in the last 72 hours Microbiology No results found for this or any previous visit (from the past 240 hour(s)).    Studies:  Dg Chest 2 View  02/25/2014   CLINICAL DATA:  63 year old female with right mastectomy 1 week prior.  EXAM: CHEST - 2 VIEW  COMPARISON:  06/24/2013  FINDINGS: Cardiomediastinal silhouette unchanged in size and contour.  Unchanged position of right IJ port catheter with the tip terminating in the superior vena cava.  Surgical changes of right mastectomy with surgical clips overlying right hemi thorax.  As was mentioned on prior plain film, pulmonary nodules are difficult to resolve with plain film imaging. These are better characterized on prior CT. No confluent airspace disease, pneumothorax, or pleural effusion.  No displaced fracture.  IMPRESSION: No radiographic evidence of acute cardiopulmonary  disease.  Unchanged position of right IJ port catheter.  Interval surgical changes of right mastectomy.  Signed,  Dulcy Fanny. Earleen Newport, DO  [facility]  [facility]   Electronically Signed   By: Corrie Mckusick D.O.   On: 02/25/2014 19:56   Ct Head Wo Contrast  02/25/2014   CLINICAL DATA:  63 year old female with right mastectomy 1 week prior.  EXAM: CT HEAD WITHOUT CONTRAST  TECHNIQUE: Contiguous axial images were obtained from the base of the skull through the vertex without intravenous contrast.  COMPARISON:  MRI 07/15/2013  FINDINGS: Unremarkable appearance of the calvarium with no displaced fracture or aggressive lesion.  Trace fluid within the right mastoid air cells. Left mastoid air cells are clear.  No significant paranasal sinus disease.  Unremarkable appearance of the bilateral orbits.  Geographic regions of hypodensity within the white matter the bilateral hemispheres. On the right this is predominantly temporoparietal, though it extends into the occipital lobe on the right and there is a focal region in the right frontal white matter. This edema contributes to mass effect and subtle midline shift measuring approximately 11 mm at the foramen of Monro.  There is a mixed density rounded lesion measuring 13 mm within the right parietal lobe (image 17), as well as a smaller hyperdensity in the right occipital lobe (image 16.)  To a lesser degree there is confluent hypodensity within the left posterior frontal lobe white matter as well as the left high parietal white matter.  Focal hyperdensity near the anterior limb of the right internal capsule (image 13).  Heterogeneous focus in the right cerebellar hemisphere (image 7).  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 The right-sided edema is worse, and contributes to 1 cm of right to left midline shift at the foramen of Monro.  Further evaluation with contrast MRI is recommended.  These results were called by telephone at the time of interpretation on 02/25/2014 at 8:30 pm to Dr. Davonna Belling , who verbally acknowledged these results.  Signed,  Dulcy Fanny. Earleen Newport, DO  [facility]  [facility]   Electronically Signed   By: Corrie Mckusick D.O.   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 Status post palliative mastectomy 02/17/2014. Back pain on the left with nausea.  EXAM: CT CHEST, ABDOMEN, AND PELVIS WITH CONTRAST  TECHNIQUE: Multidetector CT imaging of the chest, abdomen and pelvis was performed following the standard protocol during bolus administration of intravenous contrast.  CONTRAST:  147mL OMNIPAQUE IOHEXOL 300 MG/ML  SOLN  COMPARISON:  Chest CT and abdomen scan 07/15/2013. PET CT scan 01/07/2013.  FINDINGS: CT CHEST FINDINGS  Since the  prior chest CT, the patient has undergone right mastectomy. There is a small amount of gas the soft tissues from the procedure and surgical staples are in place. A small amount of simple appearing fluid is also identified.  A right axillary lymph node measuring 0.6 cm is seen. There is no fatty hilum within the node. The node is seen on the prior study, but had a clearly visible fatty hilum. Tiny right paratracheal node seen on the prior examination is unchanged. No pathologically enlarged lymph nodes are seen in the hila or mediastinum. There is no pleural or pericardial effusion. Heart size is upper normal.  There has been progression of pulmonary metastases. A new left lower lobe pulmonary nodule on image 30 measures 0.8 x 0.6 cm. A new left upper lobe nodule on image 22 measures 0.4 cm. Right upper lobe pulmonary nodule which had measured 0.7 x 0.5 cm today measures 0.8 x 0.6 cm on image 20. Tiny subpleural nodule  along the major fissure on the left seen on the prior study is unchanged on image 16. No focal bony abnormality is identified.  CT ABDOMEN AND PELVIS FINDINGS  Previously seen 0.9 cm low attenuating lesion the right hepatic lobe today measures 0.5 cm. Lesion in the left hepatic lobe which had measured 0.6 cm today measures 1.5 cm on image 58. Additional liver lesions do not appear markedly changed. No new liver lesion is identified.  The adrenal glands, spleen, gallbladder, biliary tree and pancreas appear normal. Mild prominence of the left renal pelvis seen on the prior study appear slightly increased. Abnormal axis orientation of the right kidney is noted. Small amount of free pelvic fluid is present. Urinary bladder, uterus adnexa are unremarkable. The stomach and small and large bowel appear normal. No lymphadenopathy is identified. No sclerotic lesion is seen.  IMPRESSION: Interval progression of pulmonary and hepatic metastases as detailed above.  Status post right mastectomy without complicating feature.  Trace amount of free pelvic fluid of uncertain etiology.   Electronically Signed   By: Inge Rise M.D.   On: 02/28/2014 12:20   Mr Jeri Cos FK Contrast  02/26/2014   CLINICAL DATA:  Brain metastases.  Stage IV breast cancer.  EXAM: MRI HEAD WITHOUT AND WITH CONTRAST  TECHNIQUE: Multiplanar, multiecho pulse sequences of the brain and surrounding structures were obtained without and with intravenous contrast.  CONTRAST:  78mL MULTIHANCE GADOBENATE DIMEGLUMINE 529 MG/ML IV SOLN  COMPARISON:  CT head without contrast 02/25/2014. MRI brain 07/15/2013.  FINDINGS: Multiple enhancing mass lesions are present bilaterally, more prominent right than left. Peripheral restricted diffusion is evident within multiple lesions.  A right cerebellar lesion measures 16 x 18 mm with significant surrounding edema.  A 5 mm left temporal lobe lesion is present on image 11. Bilateral occipital lobe lesions are present on  image 12. The lesion on the left measures 6.5 x 8.0 mm lesion on the right measures 3 mm. The posterior right temporal lobe lesion on image 13 measures 15 x 12 mm. A posterior left frontal lobe lesion measures 18 x 21 x 18 mm. A right parietal lesion on image 15 measures 6 mm. Additional lesions in the right frontal and right parietal lobe are contained within an an area of confluent T2 hyperintensity in the right frontal and parietal lobe. A more anterior right frontal lobe lesion measures 6 mm on image 19. A high posterior left frontal lobe lesion measures 5.5 mm with surrounding edema. A more inferior left frontal lobe  lesion measures 5.5 mm on image 21. And 6.5 mm lesion is present in the left parietal lobe. A peripheral smaller left parietal lesion is present on image 19. A punctate lesion is present within the left lentiform nucleus.  The vasogenic edema results in significant midline shift from the left to the right measuring 10 mm at the foramen of Monro. The basal cisterns are intact.  Flow is present in the major intracranial arteries. The globes and orbits are intact. The paranasal sinuses are clear. There is some fluid in the mastoid air cells bilaterally, left greater than right.  IMPRESSION: 1. Multiple enhancing mass lesions scattered throughout the brain. The largest concentration is in the posterior right frontal and right parietal lobe. 2. The largest lesion is in the posterior right frontal lobe, measuring 18 x 21 x 18 mm. 3. A more lateral right occipital lesion measures 17 x 15 x 12 mm. 4. High posterior left frontal lobe measuring 5.5 mm. 5. 18 mm right cerebellar lesion. 6. Significant midline shift measures 5 mm at the foramen of Monro.   Electronically Signed   By: Lawrence Santiago M.D.   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 Status post palliative  mastectomy 02/17/2014. Back pain on the left with nausea.  EXAM: CT CHEST, ABDOMEN, AND PELVIS WITH CONTRAST  TECHNIQUE: Multidetector CT imaging of the chest, abdomen and pelvis was performed following the standard protocol during bolus administration of intravenous contrast.  CONTRAST:  125mL OMNIPAQUE IOHEXOL 300 MG/ML  SOLN  COMPARISON:  Chest CT and abdomen scan 07/15/2013. PET CT scan 01/07/2013.  FINDINGS: CT CHEST FINDINGS  Since the prior chest CT, the patient has undergone right mastectomy. There is a small amount of gas the soft tissues from the procedure and surgical staples are in place. A small amount of simple appearing fluid is also identified.  A right axillary lymph node measuring 0.6 cm is seen. There is no fatty hilum within the node. The node is seen on the prior study, but had a clearly visible fatty hilum. Tiny right paratracheal node seen on the prior examination is unchanged. No pathologically enlarged lymph nodes are seen in the hila or mediastinum. There is no pleural or pericardial effusion. Heart size is upper normal.  There has been progression of pulmonary metastases. A new left lower lobe pulmonary nodule on image 30 measures 0.8 x 0.6 cm. A new left upper lobe nodule on image 22 measures 0.4 cm. Right upper lobe pulmonary nodule which had measured 0.7 x 0.5 cm today measures 0.8 x 0.6 cm on image 20. Tiny subpleural nodule along the major fissure on the left seen on the prior study is unchanged on image 16. No focal bony abnormality is identified.  CT ABDOMEN AND PELVIS FINDINGS  Previously seen 0.9 cm low attenuating lesion the right hepatic lobe today measures 0.5 cm. Lesion in the left hepatic lobe which had measured 0.6 cm today measures 1.5 cm on image 58. Additional liver lesions do not appear markedly changed. No new liver lesion is identified.  The adrenal glands, spleen, gallbladder, biliary tree and pancreas appear normal. Mild prominence of the left renal pelvis seen on  the prior study appear slightly increased. Abnormal axis orientation of the right kidney is noted. Small amount of free pelvic fluid is present. Urinary bladder, uterus adnexa are unremarkable.  The stomach and small and large bowel appear normal. No lymphadenopathy is identified. No sclerotic lesion is seen.  IMPRESSION: Interval progression of pulmonary and hepatic metastases as detailed above.  Status post right mastectomy without complicating feature.  Trace amount of free pelvic fluid of uncertain etiology.   Electronically Signed   By: Inge Rise M.D.   On: 02/28/2014 12:20   Assessment: 63 y.o. [name], [place] woman with a history of breast cancer metastatic at presentation August 2014, with lung and liver involvement, treated as summarized below, admitted 02/25/2014 with nausea, vomiting, headaches, and found to have multiple bilateral brain metastases.  TREATMENT HISTORY  (0) status post right breast upper outer quadrant and right axillary lymph node biopsy 12/23/2012 for a clinical T3 N1 M1, stage IV invasive ductal carcinoma, grade 3, triple negative, with an MIB-1 of 86%.  (1) right liver lobe biopsy 01/21/2013 confirms metastatic adenocarcinoma; scans show involvement of the liver, lungs, and likely bone.  (2) enrolled in [facility] study [id number] (docetaxel + oral gamma secretase inibitor [id number]), received one cycle starting 02/04/2013 but withdrew because of poor tolerance despite treatment interruptions and decreased dosing of the oral component  (3) chest CT in early November 2014 was compared with studies in August 2014 and showed interval progression of metastatic breast cancer, with an enlarging primary right breast mass, enlarging right axillary lymph nodes, enlarging pulmonary nodules and new/enlarging hepatic metastases.  (4) Abraxane started 03/19/2013, given day 1 and day 8 of each 21 day cycle, stopped with the day 1 cycle 3 dose (04/29/2013) because of local progression of disease   (5) cyclophosphamide and doxorubicin started 05/20/2013, given in dose dense fashion with Neulasta support on day 2. Completed 4 cycles 07/03/2013, with the final cycle dose reduced 15% because of an episode of febrile neutropenia after cycle 3. Restaging studies 07/15/2013 showed partial response.  (6) started capecitabine April 2015, 1.5 g by mouth twice a day, 7 days on 7 days off, last dose 02/08/2014 -- discontinued with evidence of disease progression  (7) s/p Right simple mastectomy 02/17/2014 to optimize local control, the pathology ([id number]) showing an invasive ductal carcinoma measuring 4.8 cm, grade 3, with close but negative margins and evidence of treatment response  (8) multiple brain metastases noted on brain MRI 02/26/2014--started whole brain irradiation 02/28/2014, last dose scheduled 03/17/2014  Plan: Gertrue is still pretty nauseated and unsteady; hopefully this will improve as she progresses with her treatment; I have encouraged her to walk w her husband if PT does not come by today. From my point of view plan is to follow up in the office mid [place] and repeat a brain MRI late [place] or early JAN--if there is no early recurrence will have to consider chemo (eribulin?) to control the peripheral disease (which is not immediately life-threatening). If there is early recurrence will consider SRS and/or Hospice referral.  I have discussed advanced directives with patient and husband. At this point they are focussed on the immediate goals (get better, get home). She remains a full code. We will discuss this further at the ourpatient visit.  Will follow with you. Appreciate excellent care this patient is receiving!   Chauncey Cruel, MD 03/07/2014  8:06 AM

## 2014-03-07 NOTE — Progress Notes (Signed)
Clinical Social Work Department BRIEF PSYCHOSOCIAL ASSESSMENT 03/07/2014  Patient:  Carrie Berry, Carrie Berry     Account Number:  [id number]     Admit date:  02/25/2014  Clinical Social Worker:  Ulyess Blossom  Date/Time:  03/07/2014 04:30 PM  Referred by:  Physician  Date Referred:  03/07/2014 Referred for  SNF Placement   Other Referral:   Interview type:  Patient Other interview type:   and patient daughter at bedside    PSYCHOSOCIAL DATA Living Status:  [place] Admitted from facility:   Level of care:   Primary support name:  Carrie Berry/spouse/[phone] Primary support relationship to patient:  SPOUSE Degree of support available:   strong    CURRENT CONCERNS Current Concerns  Post-Acute Placement   Other Concerns:    SOCIAL WORK ASSESSMENT / PLAN CSW received phone call from RN stating that initial recommendation from PT was for home, but pt feels very weak and wants to speak with CSW re: SNF for rehab.    CSW  met with pt and pt daughter, Carrie Berry at bedside. CSW introduced self and explained role. Pt daughter discussed that pt lives with pt spouse. Pt daughter shared that pt was hopeful to return home, but pt spouse works and pt daughter lives out of town and pt would not have anyone with her during the day. Pt daughter expressed that pt has become very weak secondary to radiation and concern is about pt safety at home. Pt daughter shared that pt spouse is exploring if pt family can find someone to stay with pt during the day, but if not then pt will need rehab at [facility]. CSW provided support. CSW discussed with pt and pt daughter process of SNF search. CSW discussed that pt insurance and need for radiation may limit options, but CSW can initiate search in all of [place]. Pt and pt daughter expressed understanding and agreeable to initiation of SNF search.    CSW completed FL2 and initiated SNF Search to [facility].    CSW to follow up with pt and pt family re: SNF  bed offers.    CSW to continue to follow to provide support and assist with pt discharge planning needs.   Assessment/plan status:  Psychosocial Support/Ongoing Assessment of Needs Other assessment/ plan:   discharge planning   Information/referral to community resources:   [facility] list    PATIENT'S/FAMILY'S RESPONSE TO PLAN OF CARE: Pt alert and oriented x 4. Pt quietly engaged in conversation, but very weak and tired during time of visit. Pt has supportive family is is actively involved in pt care. Pt hopeful that [facility] may be an option, but recognize there may be limitation due to pt insurance and need for radiation. Pt family appreciative of CSW support and assistance.   Carrie Berry, MSW, LCSW Clinical Social Work Weekend coverage [phone]

## 2014-03-07 NOTE — Progress Notes (Signed)
Physical Therapy Treatment Patient Details Name: Carrie Berry MRN: [id number] DOB: February 26, 1951 Today's Date: 03/07/2014    History of Present Illness 63 yo female admitted with brain mets. Sustained fall 10/20 during hospital stay. Hx of breast cancer, R mastectomy 02/17/14, mets to liver, lung    PT Comments    Pt progressing slowly limited by fatigue.  Pt very unstable with mobility and only partially corrected with use of AD.  Pt reports she does not have 24/7 assist at home and understands safety issues of being alone with current needs.  Pt and spouse willing to consider d/c to SNF level rehab prior to return home.  Follow Up Recommendations  Home health PT;Supervision/Assistance - 24 hour;SNF     Equipment Recommendations  Rolling walker with 5" wheels    Recommendations for Other Services OT consult     Precautions / Restrictions Precautions Precautions: Fall Restrictions Weight Bearing Restrictions: No    Mobility  Bed Mobility Overal bed mobility: Needs Assistance Bed Mobility: Supine to Sit     Supine to sit: Min assist     General bed mobility comments: min assist to come to EOB  Transfers Overall transfer level: Needs assistance Equipment used: 1 person hand held assist Transfers: Sit to/from Stand Sit to Stand: Min assist         General transfer comment: cues for transition position and use of UEs to self assist.  Assist to bring wt up and to balance in standing  Ambulation/Gait Ambulation/Gait assistance: Min assist;Mod assist Ambulation Distance (Feet): 75 Feet (twice) Assistive device: Rolling walker (2 wheeled) Gait Pattern/deviations: Step-through pattern;Decreased step length - right;Decreased step length - left;Shuffle;Drifts right/left;Trunk flexed;Narrow base of support Gait velocity: decr   General Gait Details: Attempted amb with HHA but pt very apprehensive and unstable.  Amb with RW and cues for posture, position from RW; physical  assist to manage RW, to correct L drift and to maintain balance.     Stairs            Wheelchair Mobility    Modified Rankin (Stroke Patients Only)       Balance Overall balance assessment: Needs assistance Sitting-balance support: Feet supported Sitting balance-Leahy Scale: Fair   Postural control: Left lateral lean Standing balance support: Bilateral upper extremity supported Standing balance-Leahy Scale: Poor                      Cognition Arousal/Alertness: Awake/alert Behavior During Therapy: WFL for tasks assessed/performed Overall Cognitive Status: Within Functional Limits for tasks assessed                      Exercises      General Comments        Pertinent Vitals/Pain Pain Assessment: No/denies pain    Home Living                      Prior Function            PT Goals (current goals can now be found in the care plan section) Acute Rehab PT Goals Patient Stated Goal: Home PT Goal Formulation: With patient Time For Goal Achievement: 03/17/14 Potential to Achieve Goals: Fair Progress towards PT goals: Progressing toward goals (Slow progress - fatigue limited)    Frequency  Min 3X/week    PT Plan Discharge plan needs to be updated    Co-evaluation  End of Session Equipment Utilized During Treatment: Gait belt Activity Tolerance: Patient limited by fatigue Patient left: in chair;with call bell/phone within reach;with family/visitor present     Time: 1203-1226 PT Time Calculation (min): 23 min  Charges:  $Gait Training: 23-37 mins                    G Codes:      Carrie Berry 2014-03-08, 5:00 PM

## 2014-03-07 NOTE — Progress Notes (Unsigned)
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

## 2014-03-07 NOTE — Progress Notes (Addendum)
Progress Note   Carrie Berry ZES:[id number] DOB: 09/26/1950 DOA: 02/25/2014 PCP: [name], MD   Brief Narrative:   Carrie Berry is an 63 y.o. 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 Patient presented 02/25/2014 with complaints of severe pounding headache in the back and on the left side associated with nausea. Patient also reported decreased fine motor skills. On admission, CT head and MRI brain are both significant for multiple brain metastases, the largest concentration in the posterior right frontal and right parietal lobe. She also has a right occipital lesion, left frontal lobe lesion, cerebellar lesion and midline shift at the foramen of [place]. Pt started RT on 02/27/2014. Patient feels little better this morning but still reports feeling unsteady when she get up and tries to walk.   Assessment/Plan:   Principal Problem:  Headache / brain metastases and vasogenic edema / midline shift   Headaches likely due to brain metastases, vasogenic edema and midline shift. No seizures.   Started RT 02/27/2014. Continue RT as planned.   Had an episode of fall on the night of 03/03/2014. No falls since then, but continues to feel weak/unsteady.   Continue decadron 8 mg PO Q 12.   Continue keppra for seizure prophylaxis.   Continue to mobilize.  Will need rehab if she does not progress over the next few days.  Active Problems:  Diarrhea  Continues to report ongoing small volume stools. No electrolyte disturbance.  Physical deconditioning  PT/OT as tolerated. Mobilize as tolerated.  Leukocytosis   Likely from Decadron. No evidence of infection.   History of breast cancer with lung and liver metastases   Brain metastases from metastatic breast cancer: Currently RT as planned.     Per CT scan, one of the liver metastases has increased in size the other one has shrunk in size, the remainder of the nodules in the liver are stable. Consistent with progression of disease.   DVT Prophylaxis   SCD's bilaterally.  GI prophylaxis   Protonix for GI prophylaxis given steroid use.  Code Status: Full.  Family Communication: Plan of care discussed with the patient and her husband at the bedside.  Disposition Plan: Home when stable.   IV Access:    Porta-Cath   Procedures and diagnostic studies:   Dg Chest 2 View 02/25/2014 No radiographic evidence of acute cardiopulmonary disease. Unchanged position of right IJ port catheter. Interval surgical changes of right mastectomy.   Ct Head Wo Contrast 02/25/2014 Evidence of multiple brain metastases of the supratentorial and infratentorial brain, with associated vasogenic edema. The right-sided edema is worse, and contributes to 1 cm of right to left midline shift at the foramen of [place]. Further evaluation with contrast MRI is recommended.   Mr [name] Contrast 02/26/2014 1. Multiple enhancing mass lesions scattered throughout the brain. The largest concentration is in the posterior right frontal and right parietal lobe. 2. The largest lesion is in the posterior right frontal lobe, measuring 18 x 21 x 18 mm. 3. A more lateral right occipital lesion measures 17 x 15 x 12 mm. 4. High posterior left frontal lobe measuring 5.5 mm. 5. 18 mm right cerebellar lesion. 6. Significant midline shift measures 5 mm at the foramen of [place].   Ct Chest W Contrast 02/28/2014 Interval progression of pulmonary and hepatic  metastases Status post right mastectomy without complicating feature. Trace amount of free pelvic fluid of uncertain etiology.   Ct Abdomen Pelvis W Contrast 02/28/2014 Interval progression of pulmonary and hepatic metastases. Status post right mastectomy without complicating feature. Trace amount of free pelvic fluid of  uncertain etiology.   Medical Consultants:    Dr. [name] (Oncology)   Dr. [name] (Radiation oncology)   Dr. [name] (Surgery)  Anti-Infectives:    None.  Subjective:   Carrie Berry continues to have loose stools.  She is sitting up in a chair today and has worked a little bit with the physical therapists. She continues to complain of feeling weak and deconditioned. Appetite poor.  Objective:    Filed Vitals:   03/06/14 1500 03/06/14 2123 03/06/14 2145 03/07/14 0605  BP: 168/71 123/76 95/66 105/64  Pulse: 61 73 70 84  Temp: 97.1 F (36.2 C) 98 F (36.7 C) 97.6 F (36.4 C) 98.2 F (36.8 C)  TempSrc: Temporal Oral Oral Oral  Resp: 15 16 16 16   Height:      Weight:      SpO2: 98% 97% 94% 100%    Intake/Output Summary (Last 24 hours) at 03/07/14 0758 Last data filed at 03/07/14 0631  Gross per 24 hour  Intake 935.83 ml  Output      0 ml  Net 935.83 ml    Exam: Gen:  NAD Cardiovascular:  RRR, No M/R/G Respiratory:  Lungs CTAB Gastrointestinal:  Abdomen soft, NT/ND, + BS Extremities:  No C/E/C  Data Reviewed:    Labs: Basic Metabolic Panel:  Recent Labs Lab 03/02/14 0530 03/03/14 0530 03/07/14 0500  NA 137 141 134*  K 4.1 4.1 4.5  CL 100 103 98  CO2 27 28 25   GLUCOSE 131* 126* 123*  BUN 15 16 19   CREATININE 0.57 0.57 0.54  CALCIUM 8.6 8.6 8.6   GFR Estimated Creatinine Clearance: 62.5 ml/min (by C-G formula based on Cr of 0.54).  CBC:  Recent Labs Lab 03/02/14 0530 03/03/14 0530 03/07/14 0500  WBC 10.6* 12.6* 13.2*  HGB 11.3* 12.1 13.6  HCT 32.8* 34.4* 37.9  MCV 99.1 98.9 95.9  PLT 146* 154 181   CBG:  Recent Labs Lab 03/03/14 1236 03/03/14 1703 03/03/14 2046 03/04/14 0759 03/04/14 1205  GLUCAP 140* 106* 129* 115* 99   Sepsis Labs:  Recent Labs Lab 03/02/14 0530 03/03/14 0530 03/07/14 0500  WBC 10.6* 12.6* 13.2*   Microbiology No results found for this or any previous visit (from the  past 240 hour(s)).   Medications:   . dexamethasone  8 mg Oral Q12H  . docusate sodium  100 mg Oral Daily  . fluticasone  2 spray Each Nare Daily  . levETIRAcetam  500 mg Oral BID  . olopatadine  1 drop Both Eyes BID  . pantoprazole  40 mg Oral Daily  . prochlorperazine  5 mg Oral TID AC & HS  . sodium chloride  3 mL Intravenous Q12H  . traMADol  100 mg Oral 4 times per day  . traZODone  100 mg Oral QHS   Continuous Infusions: . sodium chloride 50 mL/hr at 03/07/14 0525    Time spent: 25 minutes.   LOS: 10 days   [facility] Pager [phone]. If unable to reach me by pager, please call my cell phone at [phone].  *Please refer to [email or web address], password [email or web address] to get updated schedule on who will round on this patient, as hospitalists switch teams  weekly. If 7PM-7AM, please contact night-coverage at [email or web address], password [email or web address] for any overnight needs.  03/07/2014, 7:58 AM

## 2014-03-08 ENCOUNTER — Inpatient Hospital Stay (HOSPITAL_COMMUNITY): Payer: BC Managed Care – PPO

## 2014-03-08 NOTE — Progress Notes (Signed)
Occupational Therapy Treatment Patient Details Name: Carrie Berry MRN: [id number] DOB: June 06, 1950 Today's Date: 03/08/2014    History of present illness 63 yo female admitted with brain mets. Sustained fall 10/20 during hospital stay. Hx of breast cancer, R mastectomy 02/17/14, mets to liver, lung   OT comments  Pt making slow progress.  She is always willing to try but fatiques easily.  Walked to bathroom today with assist for balance and negotiating walker.    Follow Up Recommendations  SNF    Equipment Recommendations  3 in 1 bedside comode    Recommendations for Other Services      Precautions / Restrictions Precautions Precautions: Fall Restrictions Weight Bearing Restrictions: No       Mobility Bed Mobility         Supine to sit: Min guard Sit to supine: Min guard   General bed mobility comments: for lines  Transfers   Equipment used: Rolling walker (2 wheeled) Transfers: Sit to/from Stand Sit to Stand: Min assist         General transfer comment: cues for UE placement    Balance   Sitting-balance support: Feet supported Sitting balance-Leahy Scale: Fair     Standing balance support: Bilateral upper extremity supported Standing balance-Leahy Scale: Poor                     ADL       Grooming: Oral care;Brushing hair;Min guard;Sitting;Standing;Wash/dry hands (stood for teeth and hands; sat for hair)                   Toilet Transfer: Minimal assistance;Ambulation;RW;BSC   Toileting- Clothing Manipulation and Hygiene: Sitting/lateral lean;Min guard         General ADL Comments: pt ambulated to bathroom; had difficulty navigating RW through tight spaces--hit both left and right sides against walls and did not move walker away from wall without assistance.  Fatiques easily:  alternated sitting and standing for grooming.  Pt states she sat up in chair for 3 hours yesterday.  Wanted to return to bed for rest this morning.       Vision                     Perception     Praxis      Cognition   Behavior During Therapy: WFL for tasks assessed/performed Overall Cognitive Status: Within Functional Limits for tasks assessed                       Extremity/Trunk Assessment               Exercises     Shoulder Instructions       General Comments      Pertinent Vitals/ Pain       Pain Assessment: No/denies pain  Home Living Family/patient expects to be discharged to:: Skilled nursing facility Living Arrangements: Spouse/significant other                               Additional Comments: pt does not have 24/7 at home      Prior Functioning/Environment Level of Independence: Needs assistance            Frequency Min 2X/week     Progress Toward Goals  OT Goals(current goals can now be found in the care plan section)  Progress towards OT goals: Progressing toward goals  Plan Discharge plan needs to be updated    Co-evaluation                 End of Session     Activity Tolerance Patient limited by fatigue   Patient Left in bed;with call bell/phone within reach   Nurse Communication          Time:  -     Charges: OT General Charges $OT Visit: 1 Procedure OT Treatments $Self Care/Home Management : 23-37 mins  Moroni Nester 03/08/2014, 10:22 AM   Lesle Chris, OTR/L [phone] 03/08/2014

## 2014-03-08 NOTE — Progress Notes (Signed)
Progress Note   Carrie Berry:[id number] DOB: Oct 19, 1950 DOA: 02/25/2014 PCP: [name], MD   Brief Narrative:   Carrie Berry is an 63 y.o. 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 Patient presented 02/25/2014 with complaints of severe pounding headache in the back and on the left side associated with nausea. Patient also reported decreased fine motor skills. On admission, CT head and MRI brain are both significant for multiple brain metastases, the largest concentration in the posterior right frontal and right parietal lobe. She also has a right occipital lesion, left frontal lobe lesion, cerebellar lesion and midline shift at the foramen of [place]. Pt started RT on 02/27/2014. Patient feels little better this morning but still reports feeling unsteady when she get up and tries to walk.   Assessment/Plan:   Principal Problem:  Headache / brain metastases and vasogenic edema / midline shift   Headaches likely due to brain metastases, vasogenic edema and midline shift. No seizures.   Started RT 02/27/2014. Continue RT as planned.   Had an episode of fall on the night of 03/03/2014. No falls since then, but continues to feel weak/unsteady.   Continue decadron 8 mg PO Q 12.   Continue keppra for seizure prophylaxis.   Continue to mobilize.  Will need rehab if she does not progress over the next few days.  Active Problems:  Diarrhea  Resolving. No electrolyte disturbance.  Physical deconditioning  PT/OT as tolerated. Mobilize as tolerated.  Leukocytosis   Likely from Decadron. No evidence of infection.   History of breast cancer with lung and liver metastases   Brain metastases from metastatic breast cancer: Currently RT as planned.   Per CT scan, one of the liver  metastases has increased in size the other one has shrunk in size, the remainder of the nodules in the liver are stable. Consistent with progression of disease.   DVT Prophylaxis   SCD's bilaterally.  GI prophylaxis   Protonix for GI prophylaxis given steroid use.  Code Status: Full.  Family Communication: Plan of care discussed with the patient and her husband at the bedside 03/07/14, family friend at bedside today.  Disposition Plan: Home when stable.   IV Access:    Porta-Cath   Procedures and diagnostic studies:   Dg Chest 2 View 02/25/2014 No radiographic evidence of acute cardiopulmonary disease. Unchanged position of right IJ port catheter. Interval surgical changes of right mastectomy.   Ct Head Wo Contrast 02/25/2014 Evidence of multiple brain metastases of the supratentorial and infratentorial brain, with associated vasogenic edema. The right-sided edema is worse, and contributes to 1 cm of right to left midline shift at the foramen of [place]. Further evaluation with contrast MRI is recommended.   Mr [name] Contrast 02/26/2014 1. Multiple enhancing mass lesions scattered throughout the brain. The largest concentration is in the posterior right frontal and right parietal lobe. 2. The largest lesion is in the posterior right frontal lobe, measuring 18 x 21 x 18 mm. 3. A more lateral right occipital lesion measures 17 x 15 x 12 mm. 4. High posterior left frontal lobe measuring 5.5 mm. 5. 18 mm right cerebellar lesion. 6. Significant midline shift measures 5 mm at the foramen of [place].   Ct Chest W Contrast 02/28/2014 Interval progression of pulmonary and hepatic metastases  Status post right mastectomy without complicating feature. Trace amount of free pelvic fluid of uncertain etiology.   Ct Abdomen Pelvis W Contrast 02/28/2014 Interval progression of pulmonary and hepatic metastases. Status post right mastectomy without complicating feature. Trace amount of free pelvic fluid  of uncertain etiology.   Medical Consultants:    Dr. [name] (Oncology)   Dr. [name] (Radiation oncology)   Dr. [name] (Surgery)  Anti-Infectives:    None.  Subjective:   Carrie Berry says the loose stools have subsided.  Still reports a dull headache.  RN reports she leans to the left with ambulation.  Appetite fair.    Objective:    Filed Vitals:   03/07/14 0605 03/07/14 1420 03/07/14 2128 03/08/14 0345  BP: 105/64 102/57 131/73 116/78  Pulse: 84 71 71 71  Temp: 98.2 F (36.8 C) 98.4 F (36.9 C) 97.9 F (36.6 C) 97.7 F (36.5 C)  TempSrc: Oral Oral Oral Oral  Resp: 16 16 18 16   Height:      Weight:      SpO2: 100% 98% 96% 98%    Intake/Output Summary (Last 24 hours) at 03/08/14 0757 Last data filed at 03/08/14 0346  Gross per 24 hour  Intake 1664.17 ml  Output      0 ml  Net 1664.17 ml    Exam: Gen:  NAD Cardiovascular:  RRR, No M/R/G Respiratory:  Lungs CTAB Gastrointestinal:  Abdomen soft, NT/ND, + BS Extremities:  No C/E/C  Data Reviewed:    Labs: Basic Metabolic Panel:  Recent Labs Lab 03/02/14 0530 03/03/14 0530 03/07/14 0500  NA 137 141 134*  K 4.1 4.1 4.5  CL 100 103 98  CO2 27 28 25   GLUCOSE 131* 126* 123*  BUN 15 16 19   CREATININE 0.57 0.57 0.54  CALCIUM 8.6 8.6 8.6   GFR Estimated Creatinine Clearance: 62.5 ml/min (by C-G formula based on Cr of 0.54).  CBC:  Recent Labs Lab 03/02/14 0530 03/03/14 0530 03/07/14 0500  WBC 10.6* 12.6* 13.2*  HGB 11.3* 12.1 13.6  HCT 32.8* 34.4* 37.9  MCV 99.1 98.9 95.9  PLT 146* 154 181   CBG:  Recent Labs Lab 03/03/14 1236 03/03/14 1703 03/03/14 2046 03/04/14 0759 03/04/14 1205  GLUCAP 140* 106* 129* 115* 99   Microbiology No results found for this or any previous visit (from the past 240 hour(s)).   Medications:   . dexamethasone  8 mg Oral Q12H  . docusate sodium  100 mg Oral Daily  . fluticasone  2 spray Each Nare Daily  .  levETIRAcetam  500 mg Oral BID  . olopatadine  1 drop Both Eyes BID  . pantoprazole  40 mg Oral Daily  . prochlorperazine  5 mg Oral TID AC & HS  . sodium chloride  3 mL Intravenous Q12H  . traMADol  100 mg Oral 4 times per day  . traZODone  100 mg Oral QHS   Continuous Infusions: . sodium chloride 50 mL/hr at 03/07/14 2222    Time spent: 25 minutes.   LOS: 11 days   [facility] Pager [phone]. If unable to reach me by pager, please call my cell phone at [phone].  *Please refer to [email or web address], password [email or web address] to get updated schedule on who will round on this patient, as hospitalists switch teams weekly. If 7PM-7AM, please contact night-coverage at [email or web address], password [email or web address] for any overnight needs.  03/08/2014, 7:57 AM

## 2014-03-08 NOTE — Progress Notes (Signed)
CARE MANAGEMENT NOTE 03/08/2014  Patient:  Carrie Berry, Carrie Berry   Account Number:  [id number]  Date Initiated:  02/27/2014  Documentation initiated by:  Marney Doctor  Subjective/Objective Assessment:   63 yo admitted with brain mets.  Hx of breast CA     Action/Plan:   From home with spouse   Anticipated DC Date:  03/02/2014   Anticipated DC Plan:  SKILLED NURSING FACILITY  In-house referral  Clinical Social Worker      DC Forensic scientist  CM consult      [facility] Choice  HOME HEALTH   Choice offered to / List presented to:  C-1 Patient   DME arranged  3-N-1  Vassie Moselle      DME agency  [place] arranged        Status of service:  Completed, signed off Medicare Important Message given?   (If response is "NO", the following Medicare IM given date fields will be blank) Date Medicare IM given:   Medicare IM given by:   Date Additional Medicare IM given:   Additional Medicare IM given by:    Discharge Disposition:    Per UR Regulation:  Reviewed for med. necessity/level of care/duration of stay  If discussed at [place] of Stay Meetings, dates discussed:   03/03/2014    Comments:  03/08/2014 Plan dc to SNF. Jonnie Finner RN Case Mgmt phone [phone]  03/04/14 Marney Doctor RN,BSN,NCM [phone] Met with pt and husband for DC needs. PT recommends HHPT and RW.  OT recommends 3 in 1.  Pt also requesting a Therapist, sports and an Aide at home.  Pt and husband offered choice of [place] agencies.  Husband has used [facility] previously and would like to use them again.  Will need MD orders for HHPT/OT/RN/Aide RW and 3 in 1.  [place] DME rep informed that pt would like equipment delivered to their home.  Husband [name] is best contact [phone]).  [facility] rep informed of need for [facility] services.  CM will continue to follow.  02/27/14 Marney Doctor RN,BSN,NCM Chart being reviewed for CM needs.  CM will continue to follow.

## 2014-03-08 NOTE — Clinical Social Work Note (Signed)
CSW met with pt and family at bedside.  CSW updated family that currently there have been no bed offers received. CSW discussed expanding search to provide more options.  Family agreeable.  CSW will fax to [place] and [place].  Pt and family stated that [facility] is their choice for snf.  CSW called [facility] and left a voicemail for [place] at admissions.  CSW will follow up tomorrow for hopeful bed offers.  Pt with room full of family visiting, her parents, her daugher, grandson, husband and son in Sports coach.  Eduard Clos, MSW, [place]

## 2014-03-08 NOTE — Progress Notes (Signed)
NUTRITION FOLLOW UP  Intervention:   -Will provide [employer] Instant Breakfast BID -Patient encouraged to order foods per preference to promote adequate oral intake.   Nutrition Dx:   Inadequate oral intake related to nauseas as evidenced by patient report, ongoing  Goal:   Intake of meals and supplements to meet >90% estimated needs, not met  Monitor:   Intake, diet tolerance, labs, weight trend  Assessment:   Patient admitted with recurrent sinusitis, breast cancer with mets to the brain, lung, liver. S/p fall this am with skin tear to left arm.  Patient reports continued fair PO intake, with 40-50% meal completion. Nausea and loose stools have improved. She has been drinking [employer] brought from home on occasion, but not daily. She is open to having this provided here. Weight by bed scale today was 111.5 pounds, which is down from 118 pounds on 10/20.   Height: Ht Readings from Last 1 Encounters:  02/25/14 5' 3.5" (1.613 m)    Weight Status:   Wt Readings from Last 1 Encounters:  02/25/14 122 lb 9.2 oz (55.6 kg)    Re-estimated needs:  Kcal: 1700-1900  Protein: 70-80 gm  Fluid: >1.7L daily  Skin: tear left arm  Diet Order: General   Intake/Output Summary (Last 24 hours) at 03/08/14 1220 Last data filed at 03/08/14 0346  Gross per 24 hour  Intake 1304.17 ml  Output      0 ml  Net 1304.17 ml    Last BM: 10/24   Labs:   Recent Labs Lab 03/02/14 0530 03/03/14 0530 03/07/14 0500  NA 137 141 134*  K 4.1 4.1 4.5  CL 100 103 98  CO2 _0 BUN _1 CREATININE 0.57 0.57 0.54  CALCIUM 8.6 8.6 8.6  GLUCOSE 131* 126* 123*    CBG (last 3)  No results found for this basename: GLUCAP,  in the last 72 hours  Scheduled Meds: . dexamethasone  8 mg Oral Q12H  . docusate sodium  100 mg Oral Daily  . fluticasone  2 spray Each Nare Daily  . levETIRAcetam  500 mg Oral BID  . olopatadine  1 drop Both Eyes BID  . pantoprazole  40 mg  Oral Daily  . prochlorperazine  5 mg Oral TID AC & HS  . sodium chloride  3 mL Intravenous Q12H  . traMADol  100 mg Oral 4 times per day  . traZODone  100 mg Oral QHS    Continuous Infusions: . sodium chloride 50 mL/hr at 03/07/14 2222    Larey Seat, RD, LDN Pager #: [phone] [place] Pager #: [phone]

## 2014-03-08 NOTE — Progress Notes (Signed)
Carrie Berry   DOB:[birth year]   SJ#:[id number]   TML#:[id number]  Subjective: tells me she ambulated to nursing station with PTx; felt "weak;" headache now a dull ache, fairly constant-- "it's OK"; normal BM; husband in shower   Objective: middle aged white woman examined in bed Filed Vitals:   03/08/14 0345  BP: 116/78  Pulse: 71  Temp: 97.7 F (36.5 C)  Resp: 16    Body mass index is 21.37 kg/(m^2).  Intake/Output Summary (Last 24 hours) at 03/08/14 0746 Last data filed at 03/08/14 0346  Gross per 24 hour  Intake 1664.17 ml  Output      0 ml  Net 1664.17 ml     Sclerae unicteric  Oropharynx  no thrush  No peripheral adenopathy  Lungs clear -- auscultated anterolaterally  Heart regular rate and rhythm  Abdomen soft, nondistended, +BS, NT  MSK no joint edema  Neuro: nonfocal    CBG (last 3)  No results found for this basename: GLUCAP,  in the last 72 hours   Labs:  Lab Results  Component Value Date   WBC 13.2* 03/07/2014   HGB 13.6 03/07/2014   HCT 37.9 03/07/2014   MCV 95.9 03/07/2014   PLT 181 03/07/2014   NEUTROABS 6.6* 02/02/2014    @LASTCHEMISTRY @  Urine Studies No results found for this basename: UACOL, UAPR, USPG, UPH, UTP, UGL, UKET, UBIL, UHGB, UNIT, UROB, ULEU, UEPI, UWBC, URBC, UBAC, CAST, CRYS, UCOM, BILUA,  in the last 72 hours  Basic Metabolic Panel:  Recent Labs Lab 03/02/14 0530 03/03/14 0530 03/07/14 0500  NA 137 141 134*  K 4.1 4.1 4.5  CL 100 103 98  CO2 27 28 25   GLUCOSE 131* 126* 123*  BUN 15 16 19   CREATININE 0.57 0.57 0.54  CALCIUM 8.6 8.6 8.6   GFR Estimated Creatinine Clearance: 62.5 ml/min (by C-G formula based on Cr of 0.54). Liver Function Tests: No results found for this basename: AST, ALT, ALKPHOS, BILITOT, PROT, ALBUMIN,  in the last 168 hours No results found for this basename: LIPASE, AMYLASE,  in the last 168 hours No results found for this basename: AMMONIA,  in the last 168 hours Coagulation profile No  results found for this basename: INR, PROTIME,  in the last 168 hours  CBC:  Recent Labs Lab 03/02/14 0530 03/03/14 0530 03/07/14 0500  WBC 10.6* 12.6* 13.2*  HGB 11.3* 12.1 13.6  HCT 32.8* 34.4* 37.9  MCV 99.1 98.9 95.9  PLT 146* 154 181   Cardiac Enzymes: No results found for this basename: CKTOTAL, CKMB, CKMBINDEX, TROPONINI,  in the last 168 hours BNP: No components found with this basename: POCBNP,  CBG:  Recent Labs Lab 03/03/14 1236 03/03/14 1703 03/03/14 2046 03/04/14 0759 03/04/14 1205  GLUCAP 140* 106* 129* 115* 99   D-Dimer No results found for this basename: DDIMER,  in the last 72 hours Hgb A1c No results found for this basename: HGBA1C,  in the last 72 hours Lipid Profile No results found for this basename: CHOL, HDL, LDLCALC, TRIG, CHOLHDL, LDLDIRECT,  in the last 72 hours Thyroid function studies No results found for this basename: TSH, T4TOTAL, FREET3, T3FREE, THYROIDAB,  in the last 72 hours Anemia work up No results found for this basename: VITAMINB12, FOLATE, FERRITIN, TIBC, IRON, RETICCTPCT,  in the last 72 hours Microbiology No results found for this or any previous visit (from the past 240 hour(s)).    Studies:  Dg Chest 2 View  02/25/2014   CLINICAL  DATA:  63 year old female with right mastectomy 1 week prior.  EXAM: CHEST - 2 VIEW  COMPARISON:  06/24/2013  FINDINGS: Cardiomediastinal silhouette unchanged in size and contour.  Unchanged position of right IJ port catheter with the tip terminating in the superior vena cava.  Surgical changes of right mastectomy with surgical clips overlying right hemi thorax.  As was mentioned on prior plain film, pulmonary nodules are difficult to resolve with plain film imaging. These are better characterized on prior CT. No confluent airspace disease, pneumothorax, or pleural effusion.  No displaced fracture.  IMPRESSION: No radiographic evidence of acute cardiopulmonary disease.  Unchanged position of right IJ  port catheter.  Interval surgical changes of right mastectomy.  Signed,  Dulcy Fanny. Earleen Newport, DO  [facility]  [facility]   Electronically Signed   By: Corrie Mckusick D.O.   On: 02/25/2014 19:56   Ct Head Wo Contrast  02/25/2014   CLINICAL DATA:  63 year old female with right mastectomy 1 week prior.  EXAM: CT HEAD WITHOUT CONTRAST  TECHNIQUE: Contiguous axial images were obtained from the base of the skull through the vertex without intravenous contrast.  COMPARISON:  MRI 07/15/2013  FINDINGS: Unremarkable appearance of the calvarium with no displaced fracture or aggressive lesion.  Trace fluid within the right mastoid air cells. Left mastoid air cells are clear.  No significant paranasal sinus disease.  Unremarkable appearance of the bilateral orbits.  Geographic regions of hypodensity within the white matter the bilateral hemispheres. On the right this is predominantly temporoparietal, though it extends into the occipital lobe on the right and there is a focal region in the right frontal white matter. This edema contributes to mass effect and subtle midline shift measuring approximately 11 mm at the foramen of [place]ro.  There is a mixed density rounded lesion measuring 13 mm within the right parietal lobe (image 17), as well as a smaller hyperdensity in the right occipital lobe (image 16.)  To a lesser degree there is confluent hypodensity within the left posterior frontal lobe white matter as well as the left high parietal white matter.  Focal hyperdensity near the anterior limb of the right internal capsule (image 13).  Heterogeneous focus in the right cerebellar hemisphere (image 7).  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 The right-sided edema is worse, and contributes to 1 cm of right to left midline shift at the foramen of [place]ro. Further evaluation with contrast MRI is  recommended.  These results were called by telephone at the time of interpretation on 02/25/2014 at 8:30 pm to Dr. Davonna Belling , who verbally acknowledged these results.  Signed,  Dulcy Fanny. Earleen Newport, DO  [facility]  [facility]   Electronically Signed   By: Corrie Mckusick D.O.   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 Status post palliative mastectomy 02/17/2014. Back pain on the left with nausea.  EXAM: CT CHEST, ABDOMEN, AND PELVIS WITH CONTRAST  TECHNIQUE: Multidetector CT imaging of the chest, abdomen and pelvis was performed following the standard protocol during bolus administration of intravenous contrast.  CONTRAST:  125mL OMNIPAQUE IOHEXOL 300 MG/ML  SOLN  COMPARISON:  Chest CT and abdomen scan 07/15/2013. PET CT scan 01/07/2013.  FINDINGS: CT CHEST FINDINGS  Since the prior chest CT, the  patient has undergone right mastectomy. There is a small amount of gas the soft tissues from the procedure and surgical staples are in place. A small amount of simple appearing fluid is also identified.  A right axillary lymph node measuring 0.6 cm is seen. There is no fatty hilum within the node. The node is seen on the prior study, but had a clearly visible fatty hilum. Tiny right paratracheal node seen on the prior examination is unchanged. No pathologically enlarged lymph nodes are seen in the hila or mediastinum. There is no pleural or pericardial effusion. Heart size is upper normal.  There has been progression of pulmonary metastases. A new left lower lobe pulmonary nodule on image 30 measures 0.8 x 0.6 cm. A new left upper lobe nodule on image 22 measures 0.4 cm. Right upper lobe pulmonary nodule which had measured 0.7 x 0.5 cm today measures 0.8 x 0.6 cm on image 20. Tiny subpleural nodule along the major fissure on the left seen  on the prior study is unchanged on image 16. No focal bony abnormality is identified.  CT ABDOMEN AND PELVIS FINDINGS  Previously seen 0.9 cm low attenuating lesion the right hepatic lobe today measures 0.5 cm. Lesion in the left hepatic lobe which had measured 0.6 cm today measures 1.5 cm on image 58. Additional liver lesions do not appear markedly changed. No new liver lesion is identified.  The adrenal glands, spleen, gallbladder, biliary tree and pancreas appear normal. Mild prominence of the left renal pelvis seen on the prior study appear slightly increased. Abnormal axis orientation of the right kidney is noted. Small amount of free pelvic fluid is present. Urinary bladder, uterus adnexa are unremarkable. The stomach and small and large bowel appear normal. No lymphadenopathy is identified. No sclerotic lesion is seen.  IMPRESSION: Interval progression of pulmonary and hepatic metastases as detailed above.  Status post right mastectomy without complicating feature.  Trace amount of free pelvic fluid of uncertain etiology.   Electronically Signed   By: Inge Rise M.D.   On: 02/28/2014 12:20   Mr Jeri Cos [place] Contrast  02/26/2014   CLINICAL DATA:  Brain metastases.  Stage IV breast cancer.  EXAM: MRI HEAD WITHOUT AND WITH CONTRAST  TECHNIQUE: Multiplanar, multiecho pulse sequences of the brain and surrounding structures were obtained without and with intravenous contrast.  CONTRAST:  8mL MULTIHANCE GADOBENATE DIMEGLUMINE 529 MG/ML IV SOLN  COMPARISON:  CT head without contrast 02/25/2014. MRI brain 07/15/2013.  FINDINGS: Multiple enhancing mass lesions are present bilaterally, more prominent right than left. Peripheral restricted diffusion is evident within multiple lesions.  A right cerebellar lesion measures 16 x 18 mm with significant surrounding edema.  A 5 mm left temporal lobe lesion is present on image 11. Bilateral occipital lobe lesions are present on image 12. The lesion on the left measures  6.5 x 8.0 mm lesion on the right measures 3 mm. The posterior right temporal lobe lesion on image 13 measures 15 x 12 mm. A posterior left frontal lobe lesion measures 18 x 21 x 18 mm. A right parietal lesion on image 15 measures 6 mm. Additional lesions in the right frontal and right parietal lobe are contained within an an area of confluent T2 hyperintensity in the right frontal and parietal lobe. A more anterior right frontal lobe lesion measures 6 mm on image 19. A high posterior left frontal lobe lesion measures 5.5 mm with surrounding edema. A more inferior left frontal lobe lesion measures 5.5 mm  on image 21. And 6.5 mm lesion is present in the left parietal lobe. A peripheral smaller left parietal lesion is present on image 19. A punctate lesion is present within the left lentiform nucleus.  The vasogenic edema results in significant midline shift from the left to the right measuring 10 mm at the foramen of Monro. The basal cisterns are intact.  Flow is present in the major intracranial arteries. The globes and orbits are intact. The paranasal sinuses are clear. There is some fluid in the mastoid air cells bilaterally, left greater than right.  IMPRESSION: 1. Multiple enhancing mass lesions scattered throughout the brain. The largest concentration is in the posterior right frontal and right parietal lobe. 2. The largest lesion is in the posterior right frontal lobe, measuring 18 x 21 x 18 mm. 3. A more lateral right occipital lesion measures 17 x 15 x 12 mm. 4. High posterior left frontal lobe measuring 5.5 mm. 5. 18 mm right cerebellar lesion. 6. Significant midline shift measures 5 mm at the foramen of Monro.   Electronically Signed   By: Lawrence Santiago M.D.   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 Status post palliative mastectomy 02/17/2014. Back pain on the  left with nausea.  EXAM: CT CHEST, ABDOMEN, AND PELVIS WITH CONTRAST  TECHNIQUE: Multidetector CT imaging of the chest, abdomen and pelvis was performed following the standard protocol during bolus administration of intravenous contrast.  CONTRAST:  164mL OMNIPAQUE IOHEXOL 300 MG/ML  SOLN  COMPARISON:  Chest CT and abdomen scan 07/15/2013. PET CT scan 01/07/2013.  FINDINGS: CT CHEST FINDINGS  Since the prior chest CT, the patient has undergone right mastectomy. There is a small amount of gas the soft tissues from the procedure and surgical staples are in place. A small amount of simple appearing fluid is also identified.  A right axillary lymph node measuring 0.6 cm is seen. There is no fatty hilum within the node. The node is seen on the prior study, but had a clearly visible fatty hilum. Tiny right paratracheal node seen on the prior examination is unchanged. No pathologically enlarged lymph nodes are seen in the hila or mediastinum. There is no pleural or pericardial effusion. Heart size is upper normal.  There has been progression of pulmonary metastases. A new left lower lobe pulmonary nodule on image 30 measures 0.8 x 0.6 cm. A new left upper lobe nodule on image 22 measures 0.4 cm. Right upper lobe pulmonary nodule which had measured 0.7 x 0.5 cm today measures 0.8 x 0.6 cm on image 20. Tiny subpleural nodule along the major fissure on the left seen on the prior study is unchanged on image 16. No focal bony abnormality is identified.  CT ABDOMEN AND PELVIS FINDINGS  Previously seen 0.9 cm low attenuating lesion the right hepatic lobe today measures 0.5 cm. Lesion in the left hepatic lobe which had measured 0.6 cm today measures 1.5 cm on image 58. Additional liver lesions do not appear markedly changed. No new liver lesion is identified.  The adrenal glands, spleen, gallbladder, biliary tree and pancreas appear normal. Mild prominence of the left renal pelvis seen on the prior study appear slightly increased.  Abnormal axis orientation of the right kidney is noted. Small amount of free pelvic fluid is present. Urinary bladder, uterus adnexa are unremarkable. The stomach and small  and large bowel appear normal. No lymphadenopathy is identified. No sclerotic lesion is seen.  IMPRESSION: Interval progression of pulmonary and hepatic metastases as detailed above.  Status post right mastectomy without complicating feature.  Trace amount of free pelvic fluid of uncertain etiology.   Electronically Signed   By: Inge Rise M.D.   On: 02/28/2014 12:20   Assessment: 63 y.o. [name], [place] woman with a history of breast cancer metastatic at presentation August 2014, with lung and liver involvement, treated as summarized below, admitted 02/25/2014 with nausea, vomiting, headaches, and found to have multiple bilateral brain metastases.  TREATMENT HISTORY  (0) status post right breast upper outer quadrant and right axillary lymph node biopsy 12/23/2012 for a clinical T3 N1 M1, stage IV invasive ductal carcinoma, grade 3, triple negative, with an MIB-1 of 86%.  (1) right liver lobe biopsy 01/21/2013 confirms metastatic adenocarcinoma; scans show involvement of the liver, lungs, and likely bone.  (2) enrolled in [facility] study [id number] (docetaxel + oral gamma secretase inibitor [id number]), received one cycle starting 02/04/2013 but withdrew because of poor tolerance despite treatment interruptions and decreased dosing of the oral component  (3) chest CT in early November 2014 was compared with studies in August 2014 and showed interval progression of metastatic breast cancer, with an enlarging primary right breast mass, enlarging right axillary lymph nodes, enlarging pulmonary nodules and new/enlarging hepatic metastases.  (4) Abraxane started 03/19/2013, given day 1 and day 8 of each 21 day cycle, stopped with the day 1 cycle 3 dose (04/29/2013) because of local progression of disease  (5) cyclophosphamide and doxorubicin  started 05/20/2013, given in dose dense fashion with Neulasta support on day 2. Completed 4 cycles 07/03/2013, with the final cycle dose reduced 15% because of an episode of febrile neutropenia after cycle 3. Restaging studies 07/15/2013 showed partial response.  (6) started capecitabine April 2015, 1.5 g by mouth twice a day, 7 days on 7 days off, last dose 02/08/2014 -- discontinued with evidence of disease progression  (7) s/p Right simple mastectomy 02/17/2014 to optimize local control, the pathology ([id number]) showing an invasive ductal carcinoma measuring 4.8 cm, grade 3, with close but negative margins and evidence of treatment response  (8) multiple brain metastases noted on brain MRI 02/26/2014--started whole brain irradiation 02/28/2014, last dose scheduled 03/17/2014  Plan: Inaara is making slow progress. She/ family are considering Rehab facility and I think this would be a good transition plan for them. She remains a full code. We will discuss this further at the ourpatient visit., which will be late November  Will follow with you. Appreciate excellent care this patient is receiving!   Chauncey Cruel, MD 03/08/2014  7:46 AM

## 2014-03-09 ENCOUNTER — Ambulatory Visit
Admit: 2014-03-09 | Discharge: 2014-03-09 | Disposition: A | Discharge status: Home / Self Care | Payer: BC Managed Care – PPO | Attending: Radiation Oncology | Admitting: Radiation Oncology

## 2014-03-09 DIAGNOSIS — G40109 Localization-related (focal) (partial) symptomatic epilepsy and epileptic syndromes with simple partial seizures, not intractable, without status epilepticus: Secondary | ICD-10-CM

## 2014-03-09 HISTORY — DX: Localization-related (focal) (partial) symptomatic epilepsy and epileptic syndromes with simple partial seizures, not intractable, without status epilepticus: G40.109

## 2014-03-09 MED ORDER — LEVETIRACETAM 750 MG PO TABS
750.0000 mg | ORAL_TABLET | Freq: Two times a day (BID) | ORAL | Status: DC
  Administered 2014-03-09 – 2014-03-10 (×2): 750 mg via ORAL
  Filled 2014-03-09 (×3): qty 1

## 2014-03-09 MED ORDER — LORAZEPAM 2 MG/ML IJ SOLN
1.0000 mg | Freq: Once | INTRAMUSCULAR | Status: AC
  Administered 2014-03-09: 1 mg via INTRAVENOUS
  Filled 2014-03-09: qty 1

## 2014-03-09 MED ORDER — FLEET ENEMA 7-19 GM/118ML RE ENEM
1.0000 | ENEMA | Freq: Once | RECTAL | Status: AC
  Administered 2014-03-09: 1 via RECTAL
  Filled 2014-03-09: qty 1

## 2014-03-09 MED ORDER — LEVETIRACETAM IN NACL 1000 MG/100ML IV SOLN
1000.0000 mg | Freq: Once | INTRAVENOUS | Status: AC
  Administered 2014-03-09: 1000 mg via INTRAVENOUS
  Filled 2014-03-09: qty 100

## 2014-03-09 NOTE — Progress Notes (Addendum)
Progress Note   Carrie Berry CZY:[id number] DOB: [birth year] DOA: 02/25/2014 PCP: [name], MD   Brief Narrative:   Carrie Berry is an 63 y.o. 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 Patient presented 02/25/2014 with complaints of severe pounding headache in the back and on the left side associated with nausea. Patient also reported decreased fine motor skills. On admission, CT head and MRI brain are both significant for multiple brain metastases, the largest concentration in the posterior right frontal and right parietal lobe. She also has a right occipital lesion, left frontal lobe lesion, cerebellar lesion and midline shift at the foramen of [place]. Pt started RT on 02/27/2014. Barrier to discharge his ongoing feeling of unsteadiness and weakness. Social worker consulted for rehabilitation placement.  Neurology consulted 03/09/14 secondary to ? Focal seizure affecting right hand/forearm.  Assessment/Plan:   Principal Problem:  Headache / brain metastases and vasogenic edema / midline shift / focal seizure   Headaches likely due to brain metastases, vasogenic edema and midline shift.  Started XRT 02/27/2014. Continue XRT as planned.   Had an episode of fall on the night of 03/03/2014. No falls since then, but continues to feel weak/unsteady.   Continue decadron 8 mg PO Q 12.   Continue keppra for seizure prophylaxis. Noted to have focal muscle spasms of right hand/forearm, suspicious for focal seizure.  Spoke with Dr. Leonel Ramsay who will see the patient in consultation.  Given 1 gram IV Keppra and 1 mg IV Ativan per recommendations.  SW looking into SNF placement for rehab.  Active Problems:  Constipation/Diarrhea  Suspect fecal impaction with loose stools around impaction.   Given a Fleet enema after 1 view of the abdomen showed a moderate stool burden in the descending colon.  Physical deconditioning  PT/OT as tolerated. Mobilize as tolerated.  Leukocytosis   Likely from Decadron. No evidence of infection.   History of breast cancer with lung and liver metastases   Brain metastases from metastatic breast cancer: Currently RT as planned.   Per CT scan, one of the liver metastases has increased in size the other one has shrunk in size, the remainder of the nodules in the liver are stable. Consistent with progression of disease.   DVT Prophylaxis   SCD's bilaterally.  GI prophylaxis   Protonix for GI prophylaxis given steroid use.  Code Status: Full.  Family Communication: Plan of care discussed with the patient and her husband at the bedside.  Disposition Plan: Home vs SNF when stable.   IV Access:    Porta-Cath   Procedures and diagnostic studies:   Dg Chest 2 View 02/25/2014 No radiographic evidence of acute cardiopulmonary disease. Unchanged position of right IJ port catheter. Interval surgical changes of right mastectomy.   Ct Head Wo Contrast 02/25/2014 Evidence of multiple brain metastases of the supratentorial and infratentorial brain, with associated vasogenic edema. The right-sided edema is worse, and contributes to 1 cm of right to left midline shift at the foramen of Monro. Further evaluation with contrast MRI is recommended.   Mr [name] Contrast 02/26/2014 1. Multiple enhancing mass lesions scattered throughout the brain. The largest concentration is in the posterior right frontal and right parietal lobe. 2. The largest lesion is in the posterior right frontal lobe, measuring 18 x 21 x 18 mm.  3. A more lateral right occipital lesion measures 17 x 15 x 12 mm. 4. High posterior left frontal lobe measuring 5.5 mm. 5. 18 mm right cerebellar lesion. 6. Significant midline shift measures 5 mm at the foramen of Monro.   Ct Chest W  Contrast 02/28/2014 Interval progression of pulmonary and hepatic metastases Status post right mastectomy without complicating feature. Trace amount of free pelvic fluid of uncertain etiology.   Ct Abdomen Pelvis W Contrast 02/28/2014 Interval progression of pulmonary and hepatic metastases. Status post right mastectomy without complicating feature. Trace amount of free pelvic fluid of uncertain etiology.   Dg Abd Portable 1v 03/08/2014: No acute finding. Moderate stool burden descending colon to the rectum.      Medical Consultants:    Dr. [name] (Oncology)   Dr. [name] (Radiation oncology)   Dr. [name] (Surgery)  Dr. Leonel Ramsay (Neurology)  Anti-Infectives:    None.  Subjective:   Carrie Berry had a large BM after being given a Fleet enema.  No nausea or vomiting, no complaints of pain.  Reports some tremors to the right hand.    Objective:    Filed Vitals:   03/08/14 0345 03/08/14 1520 03/08/14 2205 03/09/14 0518  BP: 116/78 110/73 113/60 106/60  Pulse: 71 75 72 74  Temp: 97.7 F (36.5 C) 97.9 F (36.6 C) 98.4 F (36.9 C) 98.2 F (36.8 C)  TempSrc: Oral Oral Oral Oral  Resp: 16 16 16 16   Height:      Weight:      SpO2: 98% 96% 98% 98%    Intake/Output Summary (Last 24 hours) at 03/09/14 0800 Last data filed at 03/09/14 0600  Gross per 24 hour  Intake 2016.67 ml  Output      0 ml  Net 2016.67 ml    Exam: Gen:  NAD Cardiovascular:  RRR, No M/R/G Respiratory:  Lungs CTAB Gastrointestinal:  Abdomen soft, NT/ND, + BS Extremities:  No C/E/C Neuro: Focal rhythmic twitching of right hand/forearm muscles  Data Reviewed:    Labs: Basic Metabolic Panel:  Recent Labs Lab 03/03/14 0530 03/07/14 0500  NA 141 134*  K 4.1 4.5  CL 103 98  CO2 28 25  GLUCOSE 126* 123*  BUN 16 19  CREATININE 0.57 0.54  CALCIUM 8.6 8.6   GFR Estimated Creatinine Clearance: 62.5 ml/min (by C-G formula based on Cr of 0.54).  CBC:  Recent  Labs Lab 03/03/14 0530 03/07/14 0500  WBC 12.6* 13.2*  HGB 12.1 13.6  HCT 34.4* 37.9  MCV 98.9 95.9  PLT 154 181   CBG:  Recent Labs Lab 03/03/14 1236 03/03/14 1703 03/03/14 2046 03/04/14 0759 03/04/14 1205  GLUCAP 140* 106* 129* 115* 99   Microbiology No results found for this or any previous visit (from the past 240 hour(s)).   Medications:   . dexamethasone  8 mg Oral Q12H  . docusate sodium  100 mg Oral Daily  . fluticasone  2 spray Each Nare Daily  . levETIRAcetam  500 mg Oral BID  . olopatadine  1 drop Both Eyes BID  . pantoprazole  40 mg Oral Daily  . prochlorperazine  5 mg Oral TID AC & HS  . sodium chloride  3 mL Intravenous Q12H  . traMADol  100 mg Oral 4 times per day  . traZODone  100 mg Oral QHS   Continuous Infusions: . sodium chloride 50 mL/hr at 03/08/14 1724    Time spent: 25 minutes.   LOS: 12  days   Cyril Woodmansee  [facility] Pager [phone]. If unable to reach me by pager, please call my cell phone at [phone].  *Please refer to [email or web address], password [email or web address] to get updated schedule on who will round on this patient, as hospitalists switch teams weekly. If 7PM-7AM, please contact night-coverage at [email or web address], password [email or web address] for any overnight needs.  03/09/2014, 8:00 AM

## 2014-03-09 NOTE — Progress Notes (Signed)
CSW continuing to follow for disposition planning.   CSW followed up with pt and pt husband at bedside to provide SNF bed offers.  Pt and pt husband choose [facility].   CSW discussed that pt insurance requires authorization prior to pt transition to SNF. CSW discussed that there is no guarantee insurance will cover and encouraged pt husband to consider alternative options in the instance that insurance does not cover SNF. Pt husband discussed that he works during the day and pt has been very unsteady with her gait and pt and pt family do not feel that she would be safe at home.   CSW contacted [facility] and notified facility of pt acceptance of bed offer. [employer] initiated insurance authorization for [employer]. CSW to await response.  CSW to continue to follow to assist with pt discharge planning needs. Barrier at this time is Ship broker for SNF as pt is not safe to return home not having 24 hour care at home.  [name], MSW, [employer] [phone]

## 2014-03-09 NOTE — Progress Notes (Signed)
Fleet enema administered with large results.

## 2014-03-09 NOTE — Consult Note (Signed)
Neurology Consultation Reason for Consult: Right-sided abnormal movements Referring Physician: Jacquelynn Cree  CC: Abnormal right-sided movements  History is obtained from: Patient, family  HPI: Carrie Berry is a 63 y.o. female with a history of breast cancer who is having increased abnormal behaviors, such as decreased desire to put on makeup and lack of concern which is very atypical for her. Due to these as well as new onset headaches, she sought medical care in the emergency department.  At that time, she was having some tremor of her right arm, but this improved after being started on dexamethasone and Keppra. Today, her husband noticed it again for the first time since admission. He noticed 2 episodes, which she describes as lasting between 30 seconds and a couple of minutes. There is no associated confusion, difficulty with language, or other signs or symptoms.    ROS: A 14 point ROS was performed and is negative except as noted in the HPI.   Past Medical History  Diagnosis Date  . Recurrent sinus infections   . Arthritis   . PONV (postoperative nausea and vomiting)   . GERD (gastroesophageal reflux disease)   . 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 Anxiety     no problems at present  . rt breast ca dx'd 12/2011    breast  . Breast cancer   . 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 Immediate and remote memory are intact. Patient is able to give a clear and coherent history. No signs of aphasia or neglect Cranial Nerves: II: Visual Fields are full. Pupils are equal, round, and reactive to light.  Discs are difficult to visualize. III,IV, VI: EOMI without ptosis or diploplia.  V: Facial sensation is symmetric to temperature VII: Facial movement is symmetric.  VIII: hearing is intact to voice X: Uvula elevates symmetrically XI: Shoulder shrug is symmetric. XII: tongue is midline without atrophy or fasciculations.  Motor: Tone is normal. Bulk is normal. 4/5 strength was present throughout.  Sensory: Sensation is symmetric to light touch and temperature in the arms and legs. Deep Tendon Reflexes: 2+ and symmetric in the biceps and patellae.  Plantars: Toes are downgoing bilaterally.  Cerebellar: FNF intact bilaterally Gait: Not tested secondary to generalized weakness.   She has some mild resting tremor of her right arm, but it was partially obscured. By the time she held it out, the tremor had resolved.    I have reviewed labs in epic and the results pertinent to this consultation are: Bmp - unremarkable  I have reviewed the images obtained:MRI brain - multiple metastatic lesions.   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  The right-sided movements that I observed did not seem seizure-like, but this is difficult  to say because I'll be observed for a very brief period. I do think it is reasonable to increase her Keppra to 750 twice a day, but without further spread or other signs of seizure activity I do not think I would chase it further than this.  Recommendations: 1) increase Keppra to 750 twice a day 2) will continue to follow   Roland Rack, MD [facility] [phone]  If 7pm- 7am, please page neurology on call as listed in [place].

## 2014-03-09 NOTE — Progress Notes (Signed)
[facility] Therapy Treatment Record Phone [phone]   Radiation Therapy was administered to Carrie Berry on: 03/09/2014  11:54 AM and was treatment # 8 out of a planned course of 14 treatments.

## 2014-03-09 NOTE — Progress Notes (Signed)
Physical Therapy Treatment Patient Details Name: Carrie Berry MRN: [id number] DOB: 11-Nov-1950 Today's Date: 03/09/2014    History of Present Illness 63 yo female admitted with brain mets. Sustained fall 10/20 during hospital stay. Hx of breast cancer, R mastectomy 02/17/14, mets to liver, lung    PT Comments    Progressing slowly with mobility. Continues to fatigue fairly easily. Introduced a few exercises this session in addition to ambulation. Multiple seated rest breaks needed during session.   Follow Up Recommendations  SNF;Supervision/Assistance - 24 hour     Equipment Recommendations  Rolling walker with 5" wheels    Recommendations for Other Services OT consult     Precautions / Restrictions Precautions Precautions: Fall Restrictions Weight Bearing Restrictions: No    Mobility  Bed Mobility Overal bed mobility: Needs Assistance Bed Mobility: Supine to Sit;Sit to Supine     Supine to sit: Supervision Sit to supine: Supervision      Transfers Overall transfer level: Needs assistance Equipment used: Rolling walker (2 wheeled) Transfers: Sit to/from Stand Sit to Stand: Min guard Stand pivot transfers: Min guard       General transfer comment: cues for UE placement. close guard for safety  Ambulation/Gait Ambulation/Gait assistance: Min assist Ambulation Distance (Feet): 75 Feet Assistive device: Rolling walker (2 wheeled) Gait Pattern/deviations: Step-through pattern;Decreased stride length;Decreased step length - left;Decreased step length - right;Trunk flexed     General Gait Details: Assist to stabilize. Less assist needed to maneuver with walker this session-assist only needed in room/tight spaces. In hallway pt did a better job of staying within frame of walker and maintaining a straight path without veering into objects on L side.    Stairs            Wheelchair Mobility    Modified Rankin (Stroke Patients Only)       Balance                                    Cognition Arousal/Alertness: Awake/alert Behavior During Therapy: WFL for tasks assessed/performed Overall Cognitive Status: Within Functional Limits for tasks assessed                      Exercises General Exercises - Lower Extremity Hip Flexion/Marching: Both;10 reps;AROM;Standing Heel Raises: AROM;Both;10 reps;Standing Other Exercises Other Exercises: sit to stand x 5 reps, recliner.     General Comments        Pertinent Vitals/Pain Pain Assessment: No/denies pain    Home Living                      Prior Function            PT Goals (current goals can now be found in the care plan section) Progress towards PT goals: Progressing toward goals    Frequency  Min 3X/week    PT Plan Current plan remains appropriate    Co-evaluation             End of Session Equipment Utilized During Treatment: Gait belt Activity Tolerance: Patient limited by fatigue Patient left: in bed;with call bell/phone within reach;with family/visitor present     Time: 4098-1191 PT Time Calculation (min): 19 min  Charges:  $Gait Training: 8-22 mins                    G Codes:      Ozella Almond  Starleen Blue, MPT Pager: [phone]

## 2014-03-10 ENCOUNTER — Ambulatory Visit
Admit: 2014-03-10 | Discharge: 2014-03-10 | Disposition: A | Discharge status: Home / Self Care | Payer: BC Managed Care – PPO | Attending: Radiation Oncology | Admitting: Radiation Oncology

## 2014-03-10 MED ORDER — TRAZODONE HCL 50 MG PO TABS: 100.0000 mg | ORAL_TABLET | Freq: Every day | ORAL | Status: DC

## 2014-03-10 MED ORDER — HEPARIN SOD (PORK) LOCK FLUSH 100 UNIT/ML IV SOLN
500.0000 [IU] | INTRAVENOUS | Status: DC | PRN
  Filled 2014-03-10: qty 5

## 2014-03-10 MED ORDER — PANTOPRAZOLE SODIUM 40 MG PO TBEC: 40.0000 mg | DELAYED_RELEASE_TABLET | Freq: Every day | ORAL | Status: DC

## 2014-03-10 MED ORDER — POLYETHYLENE GLYCOL 3350 17 G PO PACK: 17.0000 g | PACK | Freq: Every day | ORAL | Status: DC | PRN

## 2014-03-10 MED ORDER — PROCHLORPERAZINE MALEATE 5 MG PO TABS: 5.0000 mg | ORAL_TABLET | Freq: Three times a day (TID) | ORAL | Status: DC

## 2014-03-10 MED ORDER — DSS 100 MG PO CAPS: 100.0000 mg | ORAL_CAPSULE | Freq: Every day | ORAL | Status: DC

## 2014-03-10 MED ORDER — LORAZEPAM 0.5 MG PO TABS: 0.5000 mg | ORAL_TABLET | Freq: Four times a day (QID) | ORAL | Status: DC | PRN

## 2014-03-10 MED ORDER — TRAMADOL HCL 50 MG PO TABS: 100.0000 mg | ORAL_TABLET | Freq: Four times a day (QID) | ORAL | Status: DC

## 2014-03-10 MED ORDER — LEVETIRACETAM 750 MG PO TABS: 750.0000 mg | ORAL_TABLET | Freq: Two times a day (BID) | ORAL | Status: DC

## 2014-03-10 MED ORDER — DEXAMETHASONE 4 MG PO TABS: 8.0000 mg | ORAL_TABLET | Freq: Two times a day (BID) | ORAL | Status: DC

## 2014-03-10 NOTE — Progress Notes (Signed)
Patient d/c to SNF via ambulance. Stable on d/c.- Sandie Ano RN

## 2014-03-10 NOTE — Discharge Summary (Signed)
Physician Discharge Summary  Carrie Berry BJS:[id number] DOB: Nov 08, 1950 DOA: 02/25/2014  PCP: [name], MD  Admit date: 02/25/2014 Discharge date: 03/10/2014  Recommendations for Outpatient Follow-up:  1. Continue current Decadron dose 8 mg every 12 hours. Once you complete radiation therapy, radiation oncology will provide tapering regimen for Decadron. 2. Continue Keppra 750 mg twice daily as prescribed.  Discharge Diagnoses:  Active Problems:   Primary cancer of right breast with metastasis to other site   Brain metastases   Physical deconditioning   Diarrhea   Focal motor seizure    Discharge Condition: stable   Diet recommendation: as tolerated   History of present illness:  63 y.o. 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 Patient presented 02/25/2014 with complaints of severe pounding headache in the back and on the left side associated with nausea. Patient also reported decreased fine motor skills. On admission, CT head and MRI brain are both significant for multiple brain metastases, the largest concentration in the posterior right frontal and right parietal lobe. She also has a right occipital lesion, left frontal lobe lesion, cerebellar lesion and midline shift at the foramen of [place]. Pt started RT on 02/27/2014. Barrier to discharge his ongoing feeling of unsteadiness and weakness. Social worker consulted for rehabilitation placement. Neurology consulted 03/09/14 secondary to ? Focal seizure affecting right hand/forearm.   Assessment/Plan:   Principal Problem:  Headache / brain metastases and vasogenic edema / midline shift / focal seizure  Headaches likely due to brain metastases, vasogenic edema and midline shift.  Started XRT 02/27/2014. Continue XRT as planned.  Had  an episode of fall on the night of 03/03/2014. No falls since then, but continues to feel weak/unsteady. Per physical therapy, recommendation is for skilled nursing facility placement. Patient and her family are in agreement with this discharge plan.  Continue decadron 8 mg PO Q 12. Once radiation therapy is completed, radiation oncology will taper Decadron. Continue keppra for seizure prophylaxis. Noted to have focal muscle spasms of right hand/forearm, suspicious for focal seizure. Patient has been seen by neurology in consultation. She was given 1 g IV Keppra loading dose and then 1 mg IV Ativan. Please note that her Keppra is 750 mg twice daily.  Active Problems:  Constipation/Diarrhea  Suspect fecal impaction with loose stools around impaction. Given a Fleet enema after 1 view of the abdomen showed a moderate stool burden in the descending colon. Patient had one bowel movement in the past 24 hours. Patient discharged on combination of [facility] at [facility] lax. Physical deconditioning  PT/OT as tolerated. Mobilize as tolerated. Leukocytosis  Likely from Decadron. No evidence of infection.  History of breast cancer with lung and liver metastases  Brain metastases from metastatic breast cancer: Currently RT as planned.  Per CT scan, one of the liver metastases has increased in size the other one has shrunk in size, the remainder of the nodules in the liver are stable. Consistent with progression of disease.  DVT Prophylaxis  SCD's bilaterally. GI prophylaxis  Protonix for GI prophylaxis given steroid use.   Code Status: Full.  Family Communication: Plan of care discussed with the patient and her husband at the bedside.   IV Access:   Porta-Cath  Procedures and diagnostic studies:   Dg Chest 2 View 02/25/2014 No radiographic evidence of acute cardiopulmonary disease. Unchanged  position of right IJ port catheter. Interval surgical changes of right mastectomy.  Ct Head Wo Contrast 02/25/2014  Evidence of multiple brain metastases of the supratentorial and infratentorial brain, with associated vasogenic edema. The right-sided edema is worse, and contributes to 1 cm of right to left midline shift at the foramen of [place]. Further evaluation with contrast MRI is recommended.  Mr [name] Contrast 02/26/2014 1. Multiple enhancing mass lesions scattered throughout the brain. The largest concentration is in the posterior right frontal and right parietal lobe. 2. The largest lesion is in the posterior right frontal lobe, measuring 18 x 21 x 18 mm. 3. A more lateral right occipital lesion measures 17 x 15 x 12 mm. 4. High posterior left frontal lobe measuring 5.5 mm. 5. 18 mm right cerebellar lesion. 6. Significant midline shift measures 5 mm at the foramen of [place].  Ct Chest W Contrast 02/28/2014 Interval progression of pulmonary and hepatic metastases Status post right mastectomy without complicating feature. Trace amount of free pelvic fluid of uncertain etiology.  Ct Abdomen Pelvis W Contrast 02/28/2014 Interval progression of pulmonary and hepatic metastases. Status post right mastectomy without complicating feature. Trace amount of free pelvic fluid of uncertain etiology.  Dg Abd Portable 1v 03/08/2014: No acute finding. Moderate stool burden descending colon to the rectum.   Medical Consultants:   Dr. [name] (Oncology)  Dr. [name] (Radiation oncology)  Dr. [name] (Surgery)  Dr. Leonel Ramsay (Neurology) Anti-Infectives:   None.   SignedLeisa Lenz, MD  [facility] 03/10/2014, 11:39 AM  Pager #: [phone]   Discharge Exam: Filed Vitals:   03/10/14 0644  BP: 96/66  Pulse: 83  Temp: 97.3 F (36.3 C)  Resp: 18   Filed Vitals:   03/09/14 0518 03/09/14 1442 03/09/14 2138 03/10/14 0644  BP: 106/60 114/64 100/66 96/66  Pulse: 74 73 81 83  Temp: 98.2 F (36.8 C) 98.3 F (36.8 C) 98.2 F (36.8 C) 97.3 F (36.3 C)  TempSrc: Oral Oral  Oral Oral  Resp: 16 18 18 18   Height:      Weight:      SpO2: 98% 100% 98% 98%    General: Pt is alert, follows commands appropriately, not in acute distress Cardiovascular: Regular rate and rhythm, S1/S2 +, no murmurs Respiratory: Clear to auscultation bilaterally, no wheezing, no crackles, no rhonchi Abdominal: Soft, non tender, non distended, bowel sounds +, no guarding Extremities: no edema, no cyanosis, pulses palpable bilaterally DP and PT Neuro: Grossly nonfocal  Discharge Instructions  Discharge Instructions   Call MD for:  difficulty breathing, headache or visual disturbances    Complete by:  As directed      Call MD for:  persistant dizziness or light-headedness    Complete by:  As directed      Call MD for:  persistant nausea and vomiting    Complete by:  As directed      Call MD for:  severe uncontrolled pain    Complete by:  As directed      Diet - low sodium heart healthy    Complete by:  As directed      Discharge instructions    Complete by:  As directed   1. Continue current Decadron dose 8 mg every 12 hours. Once you complete radiation therapy, radiation oncology will provide tapering regimen for Decadron. 2. Continue Keppra 750 mg twice daily as prescribed.     Increase activity slowly    Complete by:  As directed             Medication List    STOP taking these medications       aspirin-acetaminophen-caffeine 250-250-65 MG per tablet  Commonly known as:  EXCEDRIN MIGRAINE     cephALEXin 500 MG capsule  Commonly known as:  KEFLEX     omeprazole 20 MG capsule  Commonly known as:  PRILOSEC     oxyCODONE 5 MG immediate release tablet  Commonly known as:  Oxy IR/ROXICODONE      TAKE these medications       acetaminophen 325 MG tablet  Commonly known as:  TYLENOL  Take 650 mg by mouth every 8 (eight) hours as needed for pain.     dexamethasone 4 MG tablet  Commonly known as:  DECADRON  Take 2 tablets (8 mg total) by mouth every 12 (twelve)  hours.     DSS 100 MG Caps  Take 100 mg by mouth daily.     levETIRAcetam 750 MG tablet  Commonly known as:  KEPPRA  Take 1 tablet (750 mg total) by mouth 2 (two) times daily.     LORazepam 0.5 MG tablet  Commonly known as:  ATIVAN  Take 1 tablet (0.5 mg total) by mouth every 6 (six) hours as needed for anxiety.     ondansetron 8 MG tablet  Commonly known as:  ZOFRAN  Take 8 mg by mouth every 8 (eight) hours as needed for nausea or vomiting.     pantoprazole 40 MG tablet  Commonly known as:  PROTONIX  Take 1 tablet (40 mg total) by mouth daily.     PATADAY 0.2 % Soln  Generic drug:  Olopatadine HCl  Place 1 drop into both eyes daily as needed (dry eyes).     polyethylene glycol packet  Commonly known as:  MIRALAX / GLYCOLAX  Take 17 g by mouth daily as needed for mild constipation.     prochlorperazine 5 MG tablet  Commonly known as:  COMPAZINE  Take 1 tablet (5 mg total) by mouth 4 (four) times daily -  before meals and at bedtime.     tobramycin-dexamethasone ophthalmic solution  Commonly known as:  TOBRADEX  Place 1 drop into both eyes daily as needed. For dry eyes per patient     traMADol 50 MG tablet  Commonly known as:  ULTRAM  Take 2 tablets (100 mg total) by mouth every 6 (six) hours.     traZODone 50 MG tablet  Commonly known as:  DESYREL  Take 2 tablets (100 mg total) by mouth at bedtime.     triamcinolone 55 MCG/ACT nasal inhaler  Commonly known as:  NASACORT  Place 2 sprays into the nose daily.           Follow-up Information   Follow up with POLITE,RONALD D, MD. Schedule an appointment as soon as possible for a visit in 3 weeks. (Follow up appt after recent hospitalization)    Specialty:  Internal Medicine   Contact information:   [address] [place] [address] [phone]        The results of significant diagnostics from this hospitalization (including imaging, microbiology, ancillary and laboratory) are listed  below for reference.    Significant Diagnostic Studies: Dg Chest 2 View  02/25/2014   CLINICAL DATA:  63 year old female with right mastectomy 1 week prior.  EXAM: CHEST - 2 VIEW  COMPARISON:  06/24/2013  FINDINGS: Cardiomediastinal silhouette unchanged in size  and contour.  Unchanged position of right IJ port catheter with the tip terminating in the superior vena cava.  Surgical changes of right mastectomy with surgical clips overlying right hemi thorax.  As was mentioned on prior plain film, pulmonary nodules are difficult to resolve with plain film imaging. These are better characterized on prior CT. No confluent airspace disease, pneumothorax, or pleural effusion.  No displaced fracture.  IMPRESSION: No radiographic evidence of acute cardiopulmonary disease.  Unchanged position of right IJ port catheter.  Interval surgical changes of right mastectomy.  Signed,  Dulcy Fanny. Earleen Newport, DO  [facility]  [facility]   Electronically Signed   By: Corrie Mckusick D.O.   On: 02/25/2014 19:56   Ct Head Wo Contrast  02/25/2014   CLINICAL DATA:  63 year old female with right mastectomy 1 week prior.  EXAM: CT HEAD WITHOUT CONTRAST  TECHNIQUE: Contiguous axial images were obtained from the base of the skull through the vertex without intravenous contrast.  COMPARISON:  MRI 07/15/2013  FINDINGS: Unremarkable appearance of the calvarium with no displaced fracture or aggressive lesion.  Trace fluid within the right mastoid air cells. Left mastoid air cells are clear.  No significant paranasal sinus disease.  Unremarkable appearance of the bilateral orbits.  Geographic regions of hypodensity within the white matter the bilateral hemispheres. On the right this is predominantly temporoparietal, though it extends into the occipital lobe on the right and there is a focal region in the right frontal white matter. This edema contributes to mass effect and subtle midline shift measuring  approximately 11 mm at the foramen of [place].  There is a mixed density rounded lesion measuring 13 mm within the right parietal lobe (image 17), as well as a smaller hyperdensity in the right occipital lobe (image 16.)  To a lesser degree there is confluent hypodensity within the left posterior frontal lobe white matter as well as the left high parietal white matter.  Focal hyperdensity near the anterior limb of the right internal capsule (image 13).  Heterogeneous focus in the right cerebellar hemisphere (image 7).  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 The right-sided edema is worse, and contributes to 1 cm of right to left midline shift at the foramen of [place]. Further evaluation with contrast MRI is recommended.  These results were called by telephone at the time of interpretation on 02/25/2014 at 8:30 pm to Dr. Davonna Belling , who verbally acknowledged these results.  Signed,  Dulcy Fanny. Earleen Newport, DO  [facility]  [facility]   Electronically Signed   By: Corrie Mckusick D.O.   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 Status post palliative mastectomy 02/17/2014. Back pain on the left with nausea.  EXAM: CT CHEST, ABDOMEN, AND PELVIS WITH CONTRAST  TECHNIQUE: Multidetector CT imaging of the chest, abdomen and pelvis was performed following the standard protocol during bolus administration of intravenous contrast.  CONTRAST:  146mL OMNIPAQUE IOHEXOL 300 MG/ML  SOLN  COMPARISON:  Chest CT and abdomen scan 07/15/2013. PET CT scan 01/07/2013.  FINDINGS: CT CHEST FINDINGS  Since the prior chest CT, the patient has undergone right mastectomy. There is a small amount of gas the soft tissues from the procedure and surgical staples are in place. A  small amount  of simple appearing fluid is also identified.  A right axillary lymph node measuring 0.6 cm is seen. There is no fatty hilum within the node. The node is seen on the prior study, but had a clearly visible fatty hilum. Tiny right paratracheal node seen on the prior examination is unchanged. No pathologically enlarged lymph nodes are seen in the hila or mediastinum. There is no pleural or pericardial effusion. Heart size is upper normal.  There has been progression of pulmonary metastases. A new left lower lobe pulmonary nodule on image 30 measures 0.8 x 0.6 cm. A new left upper lobe nodule on image 22 measures 0.4 cm. Right upper lobe pulmonary nodule which had measured 0.7 x 0.5 cm today measures 0.8 x 0.6 cm on image 20. Tiny subpleural nodule along the major fissure on the left seen on the prior study is unchanged on image 16. No focal bony abnormality is identified.  CT ABDOMEN AND PELVIS FINDINGS  Previously seen 0.9 cm low attenuating lesion the right hepatic lobe today measures 0.5 cm. Lesion in the left hepatic lobe which had measured 0.6 cm today measures 1.5 cm on image 58. Additional liver lesions do not appear markedly changed. No new liver lesion is identified.  The adrenal glands, spleen, gallbladder, biliary tree and pancreas appear normal. Mild prominence of the left renal pelvis seen on the prior study appear slightly increased. Abnormal axis orientation of the right kidney is noted. Small amount of free pelvic fluid is present. Urinary bladder, uterus adnexa are unremarkable. The stomach and small and large bowel appear normal. No lymphadenopathy is identified. No sclerotic lesion is seen.  IMPRESSION: Interval progression of pulmonary and hepatic metastases as detailed above.  Status post right mastectomy without complicating feature.  Trace amount of free pelvic fluid of uncertain etiology.   Electronically Signed   By: Inge Rise M.D.   On: 02/28/2014 12:20   Mr Jeri Cos OE  Contrast  02/26/2014   CLINICAL DATA:  Brain metastases.  Stage IV breast cancer.  EXAM: MRI HEAD WITHOUT AND WITH CONTRAST  TECHNIQUE: Multiplanar, multiecho pulse sequences of the brain and surrounding structures were obtained without and with intravenous contrast.  CONTRAST:  27mL MULTIHANCE GADOBENATE DIMEGLUMINE 529 MG/ML IV SOLN  COMPARISON:  CT head without contrast 02/25/2014. MRI brain 07/15/2013.  FINDINGS: Multiple enhancing mass lesions are present bilaterally, more prominent right than left. Peripheral restricted diffusion is evident within multiple lesions.  A right cerebellar lesion measures 16 x 18 mm with significant surrounding edema.  A 5 mm left temporal lobe lesion is present on image 11. Bilateral occipital lobe lesions are present on image 12. The lesion on the left measures 6.5 x 8.0 mm lesion on the right measures 3 mm. The posterior right temporal lobe lesion on image 13 measures 15 x 12 mm. A posterior left frontal lobe lesion measures 18 x 21 x 18 mm. A right parietal lesion on image 15 measures 6 mm. Additional lesions in the right frontal and right parietal lobe are contained within an an area of confluent T2 hyperintensity in the right frontal and parietal lobe. A more anterior right frontal lobe lesion measures 6 mm on image 19. A high posterior left frontal lobe lesion measures 5.5 mm with surrounding edema. A more inferior left frontal lobe lesion measures 5.5 mm on image 21. And 6.5 mm lesion is present in the left parietal lobe. A peripheral smaller left parietal lesion is present on image 19. A punctate lesion  is present within the left lentiform nucleus.  The vasogenic edema results in significant midline shift from the left to the right measuring 10 mm at the foramen of [place]. The basal cisterns are intact.  Flow is present in the major intracranial arteries. The globes and orbits are intact. The paranasal sinuses are clear. There is some fluid in the mastoid air cells  bilaterally, left greater than right.  IMPRESSION: 1. Multiple enhancing mass lesions scattered throughout the brain. The largest concentration is in the posterior right frontal and right parietal lobe. 2. The largest lesion is in the posterior right frontal lobe, measuring 18 x 21 x 18 mm. 3. A more lateral right occipital lesion measures 17 x 15 x 12 mm. 4. High posterior left frontal lobe measuring 5.5 mm. 5. 18 mm right cerebellar lesion. 6. Significant midline shift measures 5 mm at the foramen of [place].   Electronically Signed   By: Lawrence Santiago M.D.   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 Status post palliative mastectomy 02/17/2014. Back pain on the left with nausea.  EXAM: CT CHEST, ABDOMEN, AND PELVIS WITH CONTRAST  TECHNIQUE: Multidetector CT imaging of the chest, abdomen and pelvis was performed following the standard protocol during bolus administration of intravenous contrast.  CONTRAST:  187mL OMNIPAQUE IOHEXOL 300 MG/ML  SOLN  COMPARISON:  Chest CT and abdomen scan 07/15/2013. PET CT scan 01/07/2013.  FINDINGS: CT CHEST FINDINGS  Since the prior chest CT, the patient has undergone right mastectomy. There is a small amount of gas the soft tissues from the procedure and surgical staples are in place. A small amount of simple appearing fluid is also identified.  A right axillary lymph node measuring 0.6 cm is seen. There is no fatty hilum within the node. The node is seen on the prior study, but had a clearly visible fatty hilum. Tiny right paratracheal node seen on the prior examination is unchanged. No pathologically enlarged lymph nodes are seen in the hila or mediastinum. There is no pleural or pericardial effusion. Heart size is upper normal.  There has been progression of pulmonary metastases. A new left lower lobe pulmonary nodule on image 30  measures 0.8 x 0.6 cm. A new left upper lobe nodule on image 22 measures 0.4 cm. Right upper lobe pulmonary nodule which had measured 0.7 x 0.5 cm today measures 0.8 x 0.6 cm on image 20. Tiny subpleural nodule along the major fissure on the left seen on the prior study is unchanged on image 16. No focal bony abnormality is identified.  CT ABDOMEN AND PELVIS FINDINGS  Previously seen 0.9 cm low attenuating lesion the right hepatic lobe today measures 0.5 cm. Lesion in the left hepatic lobe which had measured 0.6 cm today measures 1.5 cm on image 58. Additional liver lesions do not appear markedly changed. No new liver lesion is identified.  The adrenal glands, spleen, gallbladder, biliary tree and pancreas appear normal. Mild prominence of the left renal pelvis seen on the prior study appear slightly increased. Abnormal axis orientation of the right kidney is noted. Small amount of free pelvic fluid is present. Urinary bladder, uterus adnexa are unremarkable. The stomach and small and large bowel appear normal. No lymphadenopathy is identified. No sclerotic lesion is seen.  IMPRESSION: Interval progression of pulmonary and hepatic metastases as detailed above.  Status  post right mastectomy without complicating feature.  Trace amount of free pelvic fluid of uncertain etiology.   Electronically Signed   By: Inge Rise M.D.   On: 02/28/2014 12:20   Dg Abd Portable 1v  03/08/2014   CLINICAL DATA:  Constipation.  EXAM: PORTABLE ABDOMEN - 1 VIEW  COMPARISON:  CT abdomen and pelvis 02/28/2014.  FINDINGS: Oral contrast from patient's CT scan is in the descending colon to the rectum where a moderate stool burden is identified. The bowel gas pattern is nonobstructive.  IMPRESSION: No acute finding. Moderate stool burden descending colon to the rectum.   Electronically Signed   By: Inge Rise M.D.   On: 03/08/2014 18:30    Microbiology: No results found for this or any previous visit (from the past 240  hour(s)).   Labs: Basic Metabolic Panel:  Recent Labs Lab 03/07/14 0500  NA 134*  K 4.5  CL 98  CO2 25  GLUCOSE 123*  BUN 19  CREATININE 0.54  CALCIUM 8.6   Liver Function Tests: No results found for this basename: AST, ALT, ALKPHOS, BILITOT, PROT, ALBUMIN,  in the last 168 hours No results found for this basename: LIPASE, AMYLASE,  in the last 168 hours No results found for this basename: AMMONIA,  in the last 168 hours CBC:  Recent Labs Lab 03/07/14 0500  WBC 13.2*  HGB 13.6  HCT 37.9  MCV 95.9  PLT 181   Cardiac Enzymes: No results found for this basename: CKTOTAL, CKMB, CKMBINDEX, TROPONINI,  in the last 168 hours BNP: BNP (last 3 results) No results found for this basename: PROBNP,  in the last 8760 hours CBG:  Recent Labs Lab 03/03/14 1236 03/03/14 1703 03/03/14 2046 03/04/14 0759 03/04/14 1205  GLUCAP 140* 106* 129* 115* 99    Time coordinating discharge: Over 30 minutes

## 2014-03-10 NOTE — Plan of Care (Signed)
Problem: Phase III Progression Outcomes Goal: Pain controlled on oral analgesia Outcome: Completed/Met Date Met:  03/10/14 Patient denies pain,comfortable,on scheduled Ultram po.

## 2014-03-10 NOTE — Progress Notes (Signed)
Discharge instructions given to patient's husband,discussed patient's meds and appointments,family member verbalized understanding. All questions answered appropriately by this Probation officer. Patient is stable to be d/c to [facility]. Denies pain.Sandie Ano RN

## 2014-03-10 NOTE — Progress Notes (Signed)
CSW continuing to follow for disposition planning.  CSW received notification from [facility] that facility received insurance authorization from [facility] PPO.   CSW notified pt and pt husband at bedside. CSW provided support surrounding transition to [facility] today.   CSW notified MD.   CSW to facilitate pt discharge needs following radiation treatment today.   [name], MSW, [place] Work [phone]

## 2014-03-10 NOTE — Progress Notes (Signed)
Report rendered to Charlsie Merles LPN at [facility] home,all questions answered by this writer. - Sandie Ano RN

## 2014-03-10 NOTE — Progress Notes (Signed)
Pt for discharge to [facility].  CSW facilitated pt discharge needs including contacting facility, faxing pt discharge information via [facility], discussing with pt and pt husband at bedside, providing RN phone number to call report. Pt husband plans to transport via private vehicle. CSW provided discharge packet to pt and pt husband at bedside in order for discharge packet to be provided to [facility] upon admission.   Pt coping appropriately with transition to [facility]. Pt is eager to get stronger in order to be able to return home. Pt has supportive family who plan to continue to encourage pt through rehab.  No further social work needs identified at this time.  CSW signing off.   [name], MSW, [facility] [phone]

## 2014-03-10 NOTE — Progress Notes (Signed)
[facility] Therapy Treatment Record Phone [phone]   Radiation Therapy was administered to Carrie Berry on: 03/10/2014  2:27 PM and was treatment # 9 out of a planned course of 14 treatments.

## 2014-03-10 NOTE — Discharge Instructions (Signed)
Metastatic Brain Tumor °A metastatic brain tumor is a growth in your brain. It is made of cancer cells from another part of your body that traveled to your brain through your bloodstream, along the nerves of your brain (cranial nerves), or through the opening at the base of your skull.  °CAUSES  °The most common cause of a metastatic brain tumor in men is lung cancer. In women, it is breast cancer. Other common causes include: °· Unknown types of cancers. °· Skin cancer (melanoma). °· Colon cancer. °· Kidney cancer. °SIGNS AND SYMPTOMS  °Most signs and symptoms of metastatic brain tumors are caused by increased pressure inside your brain. A headache is often the first symptom. Eventually, almost everyone with a metastatic brain tumor has a headache. Other signs and symptoms include: °· Vomiting. °· Weakness or fatigue. °· Seizures. °· Sensory problems, like tingling or numbness. °· Problems walking. °· Clumsiness. °· Emotional changes. °· Personality changes. °· Changes in speech or vision. °DIAGNOSIS  °Your health care provider can diagnose a metastatic brain tumor from your medical history and physical exam. You may also have some additional tests, including: °· A nervous system function test (neurologic exam). °· Imaging studies of your brain, such as an MRI and CT scan. °· Having a small piece of tissue removed (biopsy) from your brain to be checked under a microscope. °TREATMENT  °Treatment depends on the type of cancer you have and your overall health. It also depends on the size of your tumor and the number of brain tumors you have. The most common treatments include: °· Medicines to control pain, nausea, and seizures. °· Cancer-killing drugs (chemotherapy). °· An X-ray treatment called radiation therapy to kill brain tumor cells. °· A type of radiation therapy that is targeted to exact locations in the brain (stereotactic radiotherapy). °· Surgery to remove brain tumors. °HOME CARE INSTRUCTIONS °· Only take  medicines as directed by your health care provider. °· Do not drive if: °¨ You are taking strong pain medicines. °¨ Your vision or thinking is not clear. °· Take two 10-minute walks every day if you are able. Exercising is a good way to relieve stress. It can also help you sleep. °· Be sure to get enough calories and protein in your diet. °¨ If you have a poor appetite or feel nauseous, eat small meals often. °¨ Supplement your diet with protein shakes or milk shakes. °· It is common to have anxiety and depression when you are coping with cancer. °¨ Talk to friends and loved ones about your feelings. °¨ Have a good support system at home. °SEEK MEDICAL CARE IF: °· You have chills or fever. °· Your medicine is not controlling your symptoms. °· Your symptoms get worse or you develop new symptoms. °· You are having trouble caring for yourself or being cared for at home. °· You are struggling with anxiety or depression. °SEEK IMMEDIATE MEDICAL CARE IF: °· You cannot stop vomiting. °· You cannot keep down any foods or fluids. °· You have sudden changes in speech or vision. °· You have a seizure. °· You can no longer take care of yourself at home. °Document Released: 01/26/2004 Document Revised: 09/15/2013 Document Reviewed: 04/28/2008 °ExitCare® Patient Information ©2015 [employer]. This information is not intended to replace advice given to you by your health care provider. Make sure you discuss any questions you have with your health care provider. ° °

## 2014-03-10 NOTE — Progress Notes (Signed)
Subjective: Continues to have occasional right arm shaking.   Exam: Filed Vitals:   03/10/14 0644  BP: 96/66  Pulse: 83  Temp: 97.3 F (36.3 C)  Resp: 18   Gen: In bed, NAD MS: awake, alert, interactive and appropriate JO:INOMV, VFF Motor: 4/5 weakness throughout.  Sensory:intact to LT   Impression: 64 yo F with brain mets from breast cancer. I suspect that her right arm tremor is more related to the mets themselves rather than seizure activity. I would not chase this aggressively unless it becomes debilitating or there is spread suggestive of seizure.   Recommendations: 1) continue keppra 750mg  BID 2) No further recommendations at this time. Please call with any further questions or concerns.   Roland Rack, MD [facility] [phone]  If 7pm- 7am, please page neurology on call as listed in [place].

## 2014-03-11 ENCOUNTER — Ambulatory Visit
Admit: 2014-03-11 | Discharge: 2014-03-11 | Disposition: A | Discharge status: Home / Self Care | Payer: BC Managed Care – PPO | Attending: Radiation Oncology | Admitting: Radiation Oncology

## 2014-03-11 ENCOUNTER — Encounter: Payer: Self-pay | Admitting: Radiation Oncology

## 2014-03-11 ENCOUNTER — Inpatient Hospital Stay
Admission: RE | Admit: 2014-03-11 | Discharge: 2014-03-11 | Disposition: A | Discharge status: Home / Self Care | Payer: Self-pay | Source: Ambulatory Visit | Attending: Radiation Oncology | Admitting: Radiation Oncology

## 2014-03-11 ENCOUNTER — Other Ambulatory Visit: Payer: Self-pay | Admitting: *Deleted

## 2014-03-11 VITALS — BP 124/63 | HR 91 | Temp 98.1°F | Resp 16

## 2014-03-11 DIAGNOSIS — Z510 Encounter for antineoplastic radiation therapy: Secondary | ICD-10-CM | POA: Diagnosis not present

## 2014-03-11 DIAGNOSIS — C7931 Secondary malignant neoplasm of brain: Secondary | ICD-10-CM | POA: Diagnosis not present

## 2014-03-11 DIAGNOSIS — C50411 Malignant neoplasm of upper-outer quadrant of right female breast: Secondary | ICD-10-CM

## 2014-03-11 DIAGNOSIS — K2100 Gastro-esophageal reflux disease with esophagitis, without bleeding: Secondary | ICD-10-CM

## 2014-03-11 DIAGNOSIS — K219 Gastro-esophageal reflux disease without esophagitis: Secondary | ICD-10-CM

## 2014-03-11 MED ORDER — TRAMADOL HCL 50 MG PO TABS: 100.0000 mg | ORAL_TABLET | Freq: Four times a day (QID) | ORAL | Status: DC

## 2014-03-11 MED ORDER — LORAZEPAM 0.5 MG PO TABS: 0.5000 mg | ORAL_TABLET | Freq: Four times a day (QID) | ORAL | Status: DC | PRN

## 2014-03-11 NOTE — Telephone Encounter (Signed)
[facility] 

## 2014-03-11 NOTE — Progress Notes (Signed)
Counseling intern spoke with patient's husband over the phone to offer support. Husband indicated concern about the patient's depressive symptoms, and requested counseling services. Patient may not be receptive to counseling at this time. However, counselor intends to follow-up and check-in regularly with the patient and husband, in order to offer ongoing support.  [facility] Counseling Intern [phone]

## 2014-03-11 NOTE — Progress Notes (Signed)
Patient taking decadron 4 mg every 12 hours. Resident of [facility]. Participating in daily PT. Denies headache, dizziness, nausea, vomiting, diplopia or ringing in the ears. Vitals stable.

## 2014-03-12 ENCOUNTER — Non-Acute Institutional Stay (SKILLED_NURSING_FACILITY): Payer: BC Managed Care – PPO | Admitting: Adult Health

## 2014-03-12 ENCOUNTER — Encounter: Payer: Self-pay | Admitting: Adult Health

## 2014-03-12 ENCOUNTER — Ambulatory Visit
Admit: 2014-03-12 | Discharge: 2014-03-12 | Disposition: A | Discharge status: Home / Self Care | Payer: BC Managed Care – PPO | Attending: Radiation Oncology | Admitting: Radiation Oncology

## 2014-03-12 DIAGNOSIS — C787 Secondary malignant neoplasm of liver and intrahepatic bile duct: Secondary | ICD-10-CM | POA: Insufficient documentation

## 2014-03-12 DIAGNOSIS — K5900 Constipation, unspecified: Secondary | ICD-10-CM | POA: Insufficient documentation

## 2014-03-12 DIAGNOSIS — K2100 Gastro-esophageal reflux disease with esophagitis, without bleeding: Secondary | ICD-10-CM

## 2014-03-12 DIAGNOSIS — K5902 Outlet dysfunction constipation: Secondary | ICD-10-CM

## 2014-03-12 DIAGNOSIS — C801 Malignant (primary) neoplasm, unspecified: Secondary | ICD-10-CM

## 2014-03-12 DIAGNOSIS — G40109 Localization-related (focal) (partial) symptomatic epilepsy and epileptic syndromes with simple partial seizures, not intractable, without status epilepticus: Secondary | ICD-10-CM

## 2014-03-12 DIAGNOSIS — C50411 Malignant neoplasm of upper-outer quadrant of right female breast: Secondary | ICD-10-CM

## 2014-03-12 DIAGNOSIS — C7931 Secondary malignant neoplasm of brain: Secondary | ICD-10-CM

## 2014-03-12 DIAGNOSIS — Z510 Encounter for antineoplastic radiation therapy: Secondary | ICD-10-CM | POA: Diagnosis not present

## 2014-03-12 DIAGNOSIS — C7800 Secondary malignant neoplasm of unspecified lung: Secondary | ICD-10-CM

## 2014-03-12 MED ORDER — POLYETHYLENE GLYCOL 3350 17 G PO PACK: 17.0000 g | PACK | Freq: Every day | ORAL | Status: DC

## 2014-03-12 MED ORDER — LEVETIRACETAM 1000 MG PO TABS: 1000.0000 mg | ORAL_TABLET | Freq: Two times a day (BID) | ORAL | Status: DC

## 2014-03-12 NOTE — Progress Notes (Signed)
Patient ID: Carrie Berry, female   DOB: 1951-02-26, 63 y.o.   MRN: [id number]     [facility]  Allergies  Allergen Reactions  . Ondansetron Hcl Other (See Comments)    HEADACHE, patient states she still takes   . Codeine Nausea And Vomiting    "violently ill"  . Penicillins Rash    Childhood reaction -    Chief Complaint  Patient presents with  . Hospitalization Follow-up    HPI:  She has been diagnosed with breast cancer in 12/2013 with mets to the liver and lung. she had a palliative right mastectomy on 02-27-14. She was admitted to the hospital with severe headaches found to have brain mets; vasogenic edema; and midline shift as well. She has questionable focal seizure on her right lower arm and hand. She is also suffering from significant constipation. Her spouse states that she has a "shy' bowel and has great difficulty with having bowel movement outside of her home. Her pain is presently being managed. She has a great deal of difficulty with vocalizing but is alert and oriented. She is unable to fully participate in the hpi or ros.     Past Medical History  Diagnosis Date  . Recurrent sinus infections   . Arthritis   . PONV (postoperative nausea and vomiting)   . GERD (gastroesophageal reflux disease)   . 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 Anxiety     no problems at present  . rt breast ca dx'd 12/2011    breast  . Breast cancer   . 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 Tubal ligation    . Tubal ligation  1985  . Portacath placement N/A 01/10/2013    Procedure: ULTRASOUND GUIDED PORT-A-CATH INSERTION WITH FLUOROSCOPY;  Surgeon: [name], MD;  Location: [place];  Service: General;  Laterality: N/A;  . Esophagogastroduodenoscopy N/A 08/19/2013    Procedure: ESOPHAGOGASTRODUODENOSCOPY (EGD);  Surgeon: Garlan Fair, MD;  Location: [facility];  Service:  Endoscopy;  Laterality: N/A;  . Total mastectomy Right 02/17/2014    Procedure: PALLIATIVE  RIGHT MASTECTOMY;  Surgeon: [name], MD;  Location: [place];  Service: General;  Laterality: Right;    VITAL SIGNS BP 113/62  Pulse 67  Ht 5' 3.5" (1.613 m)  Wt 108 lb (48.988 kg)  BMI 18.83 kg/m2   Patient's Medications  New Prescriptions   No medications on file  Previous Medications   ACETAMINOPHEN (TYLENOL) 325 MG TABLET    Take 650 mg by mouth every 8 (eight) hours as needed for pain.   DEXAMETHASONE (DECADRON) 4 MG TABLET    Take 2 tablets (8 mg total) by mouth every 12 (twelve) hours.   DOCUSATE SODIUM 100 MG CAPS    Take 100 mg by mouth daily.   LEVETIRACETAM (KEPPRA) 750 MG TABLET    Take 1 tablet (750 mg total) by mouth 2 (two) times daily.   LORAZEPAM (ATIVAN) 0.5 MG TABLET    Take 1 tablet (0.5 mg total) by mouth every 6 (six) hours as needed for anxiety.   ONDANSETRON (ZOFRAN) 8 MG TABLET    Take 8 mg by mouth every 8 (eight) hours as needed for nausea or vomiting.   PANTOPRAZOLE (PROTONIX) 40 MG TABLET    Take 1 tablet (40 mg total) by mouth daily.   PATADAY 0.2 % SOLN    Place 1 drop  into both eyes daily as needed (dry eyes).    POLYETHYLENE GLYCOL (MIRALAX / GLYCOLAX) PACKET    Take 17 g by mouth daily as needed for mild constipation.   PROCHLORPERAZINE (COMPAZINE) 5 MG TABLET    Take 1 tablet (5 mg total) by mouth 4 (four) times daily -  before meals and at bedtime.   TOBRAMYCIN-DEXAMETHASONE (TOBRADEX) OPHTHALMIC SOLUTION    Place 1 drop into both eyes daily as needed. For dry eyes per patient   TRAMADOL (ULTRAM) 50 MG TABLET    Take 2 tablets (100 mg total) by mouth every 6 (six) hours.   TRAZODONE (DESYREL) 50 MG TABLET    Take 2 tablets (100 mg total) by mouth at bedtime.   TRIAMCINOLONE (NASACORT) 55 MCG/ACT NASAL INHALER    Place 2 sprays into the nose daily.  Modified Medications   No medications on file  Discontinued Medications   CAPECITABINE (XELODA) 500 MG  TABLET       CEPHALEXIN (KEFLEX) 500 MG CAPSULE        SIGNIFICANT DIAGNOSTIC EXAMS  02-25-14: chest x-ray: No radiographic evidence of acute cardiopulmonary disease. Unchanged position of right IJ port catheter. Interval surgical changes of right mastectomy.  02-25-14: ct of head: Evidence of multiple brain metastases of the supratentorial and infratentorial brain, with associated vasogenic edema. The right-sided edema is worse, and contributes to 1 cm of right to left midline shift at the foramen of Monro. Further evaluation with contrast MRI is recommended.  02-26-14: ;mri of brain: 1. Multiple enhancing mass lesions scattered throughout the brain. The largest concentration is in the posterior right frontal and right parietal lobe. 2. The largest lesion is in the posterior right frontal lobe, measuring 18 x 21 x 18 mm. 3. A more lateral right occipital lesion measures 17 x 15 x 12 mm. 4. High posterior left frontal lobe measuring 5.5 mm. 5. 18 mm right cerebellar lesion. 6. Significant midline shift measures 5 mm at the foramen of Monro.  02-28-14; ct of chest abdomen and pelvis: Interval progression of pulmonary and hepatic metastases as detailed above. Status post right mastectomy without complicating feature. Trace amount of free pelvic fluid of uncertain etiology.   03-08-14: kub: No acute finding. Moderate stool burden descending colon to the rectum.   LABS REVIEWED:  02-25-14: wbc 8.9; hgb 12.3; hct 35.3;mcv 98.3;. plt 233; glucose 131; bun 11; creat 0.82; k+4.3;  na++ 141; liver normal albumin 3.6  03-02-14: wbc 10.6; hgb 11.3; hct 32.8; mcv 99.1; plt 146; glucose 131; bun 15; crea t0.57; k+4.1; na++ 137 03-07-14: wbc 13.2; hgb 13.6; hct 37.9; mcv 95.9; plt 181; glucose 123; bun 19; creat 0.54; k+4.5; na++ 134     Review of Systems  Unable to perform ROS    Physical Exam  Constitutional: She is oriented to person, place, and time. No distress.  frail  Neck: Neck supple. No  JVD present. No thyromegaly present.  Cardiovascular: Normal rate, regular rhythm, normal heart sounds and intact distal pulses.   Respiratory: Effort normal and breath sounds normal. No respiratory distress. She has no wheezes.  GI: Soft. She exhibits no distension.  Bowel sounds hypoactive   Musculoskeletal: She exhibits no edema.  Is able to move all extremities   Neurological: She is alert and oriented to person, place, and time.  Has rhythmic movement present right hand and lower arm. Has rhythmic movement at base of throat.    Skin: She is not diaphoretic.  ;   ASSESSMENT/  PLAN:  1. Breast cancer with mets to lung; liver and brain with vasogenic edema and midline shift: will continue to be followed by oncology is now receiving daily radiation therapy. Will continue decadron 8 mg twice daily; will continue ultram 100 mg every 6 hours routinely for pain management. Will continue therapy as directed. Her goal is to return back home at this time will continue to monitor her status.   2. Constipation; will continue her colace daily will change her miralax to one daily on a routine basis   3. 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 I have spoken with Dr. Leonel Ramsay from neurology who feels as though this is more than likely not actual seizure activity but a tumor effect. Will continue to monitor her status.   4. GERD with nausea: will continue protonix 40 mg daily; will continue compazine 5 mg four times daily ac and hs for nausea; will continue zofran 8 mg every 8 hours as needed; will monitor   5. Allergic rhinitis: will continue nasacort daily   6. Anxiety: will continue ativan 0.5 mg every 6 hours as needed and will continue trazodone 100 mg nightly for sleep and will monitor her status.     Time spent with patient 50 minutes.     Ok Edwards NP [facility]  Contact [phone] Monday through Friday 8am- 5pm  After hours  call [phone]

## 2014-03-12 NOTE — Progress Notes (Signed)
  Radiation Oncology         (336) [phone] ________________________________  Name: Carrie Berry  MRN: [id number]  Date: 03/11/2014  DOB: 1950/08/24    Weekly Radiation Therapy Management    ICD-9-CM ICD-10-CM  1. Brain metastases 198.3 C79.31   Current Dose: 25 Gy     Planned Dose:  35 Gy  Narrative . . . . . . . . The patient presents for routine under treatment assessment.                                 Patient taking decadron 4 mg every 12 hours. Resident of [facility]. Participating in daily PT. Denies headache, dizziness, nausea, vomiting, diplopia or ringing in the ears. Vitals stable.                                  Set-up films were reviewed.                                 The chart was checked. Physical Findings. . .  oral temperature is 98.1 F (36.7 C). Her blood pressure is 124/63 and her pulse is 91. Her respiration is 16 and oxygen saturation is 98%. . Weight essentially stable.  No significant changes. Impression . . . . . . . The patient is tolerating radiation. Plan . . . . . . . . . . . . Continue treatment as planned.  ________________________________  Sheral Apley. Tammi Klippel, M.D.

## 2014-03-13 ENCOUNTER — Inpatient Hospital Stay
Admit: 2014-03-13 | Discharge: 2014-03-13 | Disposition: A | Discharge status: Home / Self Care | Payer: BC Managed Care – PPO | Attending: Radiation Oncology | Admitting: Radiation Oncology

## 2014-03-13 ENCOUNTER — Non-Acute Institutional Stay (SKILLED_NURSING_FACILITY): Payer: BC Managed Care – PPO | Admitting: Internal Medicine

## 2014-03-13 ENCOUNTER — Ambulatory Visit
Admit: 2014-03-13 | Discharge: 2014-03-13 | Disposition: A | Discharge status: Home / Self Care | Payer: BC Managed Care – PPO | Attending: Radiation Oncology | Admitting: Radiation Oncology

## 2014-03-13 ENCOUNTER — Encounter: Payer: Self-pay | Admitting: Radiation Oncology

## 2014-03-13 VITALS — BP 111/77 | HR 87 | Resp 16 | Wt 108.0 lb

## 2014-03-13 DIAGNOSIS — K5902 Outlet dysfunction constipation: Secondary | ICD-10-CM

## 2014-03-13 DIAGNOSIS — R51 Headache: Secondary | ICD-10-CM

## 2014-03-13 DIAGNOSIS — Z510 Encounter for antineoplastic radiation therapy: Secondary | ICD-10-CM | POA: Diagnosis not present

## 2014-03-13 DIAGNOSIS — G4700 Insomnia, unspecified: Secondary | ICD-10-CM

## 2014-03-13 DIAGNOSIS — R630 Anorexia: Secondary | ICD-10-CM

## 2014-03-13 DIAGNOSIS — C7931 Secondary malignant neoplasm of brain: Secondary | ICD-10-CM

## 2014-03-13 DIAGNOSIS — K219 Gastro-esophageal reflux disease without esophagitis: Secondary | ICD-10-CM

## 2014-03-13 DIAGNOSIS — C50411 Malignant neoplasm of upper-outer quadrant of right female breast: Secondary | ICD-10-CM

## 2014-03-13 DIAGNOSIS — F329 Major depressive disorder, single episode, unspecified: Secondary | ICD-10-CM

## 2014-03-13 DIAGNOSIS — F32A Depression, unspecified: Secondary | ICD-10-CM

## 2014-03-13 DIAGNOSIS — R519 Headache, unspecified: Secondary | ICD-10-CM

## 2014-03-13 DIAGNOSIS — R5381 Other malaise: Secondary | ICD-10-CM

## 2014-03-13 MED ORDER — DEXAMETHASONE 4 MG PO TABS: 4.0000 mg | ORAL_TABLET | Freq: Two times a day (BID) | ORAL | Status: DC

## 2014-03-13 NOTE — Progress Notes (Signed)
Patient taking decadron 4 mg every 12 hours. Resident of [facility]. Participating in daily PT. Denies headache, dizziness, nausea, vomiting, diplopia or ringing in the ears. Vitals stable.

## 2014-03-13 NOTE — Progress Notes (Signed)
  Radiation Oncology         (336) [phone] ________________________________  Name: Carrie Berry  MRN: [id number]  Date: 03/13/2014  DOB: Jul 09, 1950  Weekly Radiation Therapy Management    ICD-9-CM ICD-10-CM  1. Brain metastases 198.3 C79.31    Current Dose: 30 Gy     Planned Dose:  35 Gy  Narrative . . . . . . . . The patient presents for routine under treatment assessment.                                   Patient taking decadron 4 mg every 12 hours. Resident of [facility] Participating in daily PT. Denies headache, dizziness, nausea, vomiting, diplopia or ringing in the ears. Vitals stable.                                  Set-up films were reviewed.                                 The chart was checked. Physical Findings. . .  weight is 108 lb (48.988 kg). Her blood pressure is 111/77 and her pulse is 87. Her respiration is 16. . Weight essentially stable.  No significant changes. Impression . . . . . . . The patient is tolerating radiation. Plan . . . . . . . . . . . . Continue treatment as planned.  Given decadron taper, complete in 2 more fractions, then, follow-up in one month.  ________________________________  Sheral Apley. Tammi Klippel, M.D.

## 2014-03-14 ENCOUNTER — Encounter: Payer: Self-pay | Admitting: Internal Medicine

## 2014-03-14 NOTE — Progress Notes (Signed)
Patient ID: Carrie Berry, female   DOB: [birth year], 63 y.o.   MRN: [id number]     Facility: [facility]    PCP: [name], MD  Code Status: full code  Allergies  Allergen Reactions  . Ondansetron Hcl Other (See Comments)    HEADACHE, patient states she still takes   . Codeine Nausea And Vomiting    "violently ill"  . Penicillins Rash    Childhood reaction -    Chief Complaint: new admission  HPI:  63 y/o female patient is here for STR after hospital admission from 02/25/14-03/10/14 with headache. 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 She was started on decadron and radiation therapy on 02/27/14. She was also started on seizure medication for seizure prophylaxis. Neurology, oncology and radiation oncology were consulted. she had a palliative right mastectomy on 02-17-14.  She was diagnosed with breast cancer in 12/2013 with metastatses to the liver and lung.  She was seen in her room today with her parents at her bedside. She appears weak and tired. She complaints of not being able to sleep at night. Family is concerned about her being depressed. She has poor po intake. Denies nausea and vomiting. Has been working with therapy team and has appointment with radiation oncology today. Her bowel movement has improved some   Review of Systems:  Constitutional: Negative for fever, chills, diaphoresis.  HENT: Negative for congestion Respiratory: Negative for cough, sputum production, wheezing.   Cardiovascular: Negative for chest pain, palpitations, orthopnea and leg swelling.  Gastrointestinal: Negative for heartburn, nausea, vomiting, abdominal pain, diarrhea.  Genitourinary: Negative for dysuria Musculoskeletal: Negative for back pain, falls Skin: Negative for itching and rash.  Neurological: Negative for dizziness, focal weakness and headaches.  Psychiatric/Behavioral: Negative  for nervous/anxious.     Past Medical History  Diagnosis Date  . Recurrent sinus infections   . Arthritis   . PONV (postoperative nausea and vomiting)   . GERD (gastroesophageal reflux disease)   . 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 Anxiety     no problems at present  . rt breast ca dx'd 12/2011    breast  . Breast cancer   . 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 Tubal ligation    . Tubal ligation  1985  . Portacath placement N/A 01/10/2013    Procedure: ULTRASOUND GUIDED PORT-A-CATH INSERTION WITH FLUOROSCOPY;  Surgeon: [name], MD;  Location: [facility];  Service: General;  Laterality: N/A;  . Esophagogastroduodenoscopy N/A 08/19/2013    Procedure: ESOPHAGOGASTRODUODENOSCOPY (EGD);  Surgeon: Garlan Fair, MD;  Location: [facility];  Service: Endoscopy;  Laterality: N/A;  . Total mastectomy Right 02/17/2014    Procedure: PALLIATIVE  RIGHT MASTECTOMY;  Surgeon: [name], MD;  Location: [facility];  Service: General;  Laterality: Right;   Social History:   reports that she has never smoked. She has never used smokeless tobacco. She reports that she does not drink alcohol or use illicit drugs.  Family History  Problem Relation Age of Onset  . Bladder Cancer Father   . Colon cancer Maternal Grandmother     Medications: Patient's Medications  New Prescriptions   No medications on file  Previous Medications   ACETAMINOPHEN (TYLENOL) 325 MG TABLET    Take 650 mg by mouth every 8 (eight) hours as  needed for pain.   DEXAMETHASONE (DECADRON) 4 MG TABLET    Take 1 tablet (4 mg total) by mouth every 12 (twelve) hours. Continue 4 mg twice daily for one week, then 2 mg twice daily for two weeks, the 2 mg once daily for two weeks, then stop.   DOCUSATE SODIUM 100 MG CAPS    Take 100 mg by mouth daily.   LEVETIRACETAM (KEPPRA) 1000 MG TABLET    Take 1 tablet (1,000 mg total)  by mouth 2 (two) times daily.   LORAZEPAM (ATIVAN) 0.5 MG TABLET    Take 1 tablet (0.5 mg total) by mouth every 6 (six) hours as needed for anxiety.   ONDANSETRON (ZOFRAN) 8 MG TABLET    Take 8 mg by mouth every 8 (eight) hours as needed for nausea or vomiting.   PANTOPRAZOLE (PROTONIX) 40 MG TABLET    Take 1 tablet (40 mg total) by mouth daily.   PATADAY 0.2 % SOLN    Place 1 drop into both eyes daily as needed (dry eyes).    POLYETHYLENE GLYCOL (MIRALAX / GLYCOLAX) PACKET    Take 17 g by mouth daily.   PROCHLORPERAZINE (COMPAZINE) 5 MG TABLET    Take 1 tablet (5 mg total) by mouth 4 (four) times daily -  before meals and at bedtime.   TOBRAMYCIN-DEXAMETHASONE (TOBRADEX) OPHTHALMIC SOLUTION    Place 1 drop into both eyes daily as needed. For dry eyes per patient   TRAMADOL (ULTRAM) 50 MG TABLET    Take 2 tablets (100 mg total) by mouth every 6 (six) hours.   TRAZODONE (DESYREL) 50 MG TABLET    Take 2 tablets (100 mg total) by mouth at bedtime.   TRIAMCINOLONE (NASACORT) 55 MCG/ACT NASAL INHALER    Place 2 sprays into the nose daily.  Modified Medications   No medications on file  Discontinued Medications   No medications on file     Physical Exam: Filed Vitals:   03/13/14 1626  BP: 107/70  Pulse: 70  Temp: 97.8 F (36.6 C)  Resp: 16  SpO2: 98%    General- frail female in no acute distress, ill appearing Head- atraumatic, normocephalic Eyes- PERRLA, EOMI, no pallor, no icterus, no discharge Neck- no lymphadenopathy Throat- moist mucus membrane, normal oropharynx Cardiovascular- normal s1,s2, no murmurs Respiratory- bilateral clear to auscultation, no wheeze, no rhonchi, no crackles, no use of accessory muscles Abdomen- bowel sounds present, soft, non tender, no CVA tenderness Musculoskeletal- able to move all 4 extremities, generalized weakness, no leg edema Neurological- no focal deficit, has fine tremors Skin- warm and dry, skin tear in left forarm, right breast mastectomy  site healing well Psychiatry- alert and oriented to person, place and time,flat affect   Labs reviewed: Basic Metabolic Panel:  Recent Labs  07/18/13 1230 07/29/13 1353  03/02/14 0530 03/03/14 0530 03/07/14 0500  NA 139  --   < > 137 141 134*  K 4.5  --   < > 4.1 4.1 4.5  CL  --   --   < > 100 103 98  CO2 27  --   < > 27 28 25   GLUCOSE 94  --   < > 131* 126* 123*  BUN 10.7  --   < > 15 16 19   CREATININE 0.8  --   < > 0.57 0.57 0.54  CALCIUM 9.6  --   < > 8.6 8.6 8.6  MG  --  2.3  --   --   --   --   < > =  values in this interval not displayed. Liver Function Tests:  Recent Labs  01/20/14 1456 02/02/14 1550 02/25/14 1656  AST 23 26 24   ALT 22 18 19   ALKPHOS 90 99 99  BILITOT 0.48 0.37 0.2*  PROT 6.6 7.1 7.2  ALBUMIN 3.7 4.0 3.6    Recent Labs  06/23/13 1858  LIPASE 21   No results found for this basename: AMMONIA,  in the last 8760 hours CBC:  Recent Labs  01/05/14 1519 01/20/14 1456 02/02/14 1550  03/02/14 0530 03/03/14 0530 03/07/14 0500  WBC 7.5 7.9 8.8  < > 10.6* 12.6* 13.2*  NEUTROABS 5.4 5.7 6.6*  --   --   --   --   HGB 10.7* 11.5* 12.2  < > 11.3* 12.1 13.6  HCT 32.1* 34.1* 37.1  < > 32.8* 34.4* 37.9  MCV 102.6* 102.1* 104.1*  < > 99.1 98.9 95.9  PLT 195 176 203  < > 146* 154 181  < > = values in this interval not displayed. Cardiac Enzymes: No results found for this basename: CKTOTAL, CKMB, CKMBINDEX, TROPONINI,  in the last 8760 hours BNP: No components found with this basename: POCBNP,  CBG:  Recent Labs  03/03/14 2046 03/04/14 0759 03/04/14 1205  GLUCAP 129* 115* 99    Radiological Exams: 02-25-14: chest x-ray: No radiographic evidence of acute cardiopulmonary disease. Unchanged position of right IJ port catheter. Interval surgical changes of right mastectomy.  02-25-14: ct of head: Evidence of multiple brain metastases of the supratentorial and infratentorial brain, with associated vasogenic edema. The right-sided edema is  worse, and contributes to 1 cm of right to left midline shift at the foramen of Monro. Further evaluation with contrast MRI is recommended.  02-26-14: ;mri of brain: 1. Multiple enhancing mass lesions scattered throughout the brain. The largest concentration is in the posterior right frontal and right parietal lobe. 2. The largest lesion is in the posterior right frontal lobe, measuring 18 x 21 x 18 mm. 3. A more lateral right occipital lesion measures 17 x 15 x 12 mm. 4. High posterior left frontal lobe measuring 5.5 mm. 5. 18 mm right cerebellar lesion. 6. Significant midline shift measures 5 mm at the foramen of Monro.  02-28-14; ct of chest abdomen and pelvis: Interval progression of pulmonary and hepatic metastases as detailed above. Status post right mastectomy without complicating feature. Trace amount of free pelvic fluid of uncertain etiology.   03-08-14: kub: No acute finding. Moderate stool burden descending colon to the rectum.   Assessment/Plan  Physical deconditioning Will have patient work with PT/OT as tolerated to regain strength and restore function.  Fall precautions are in place.  Breast cancer  continue decadron, radiation therapy and to follow with radiation oncology and oncology. continue ultram 100 mg every 6 hours routinely for pain management.   Insomnia Start melatonin 5mg  qhs and monitor. Continue trazodone  Loss of appetite With her malignancy and radiation therapy, her appetite could have been affected. Will start her on remeron 15 mg daily to help with her appetite and mood. Monitor weight. Dietary consult for supplement introduction  Depression Likely situational with her health condition. Pt agrees to be on remeron and would like psychiatry consult. NCEP consulted  Constipation continue colace and miralax   Headache Under control at present. Continue decadron and keppra for seizure prophylaxis  GERD  continue protonix 40 mg daily. Continue meds for  nausea   Family/ staff Communication: reviewed care plan with patient and nursing supervisor  Goals of care: short term rehabilitation    Labs/tests ordered: none    Blanchie Serve, MD  [place] (Monday-Friday 8 am - 5 pm) [phone] (afterhours)

## 2014-03-16 ENCOUNTER — Ambulatory Visit
Admit: 2014-03-16 | Discharge: 2014-03-16 | Disposition: A | Discharge status: Home / Self Care | Payer: BC Managed Care – PPO | Attending: Radiation Oncology | Admitting: Radiation Oncology

## 2014-03-16 DIAGNOSIS — Z510 Encounter for antineoplastic radiation therapy: Secondary | ICD-10-CM | POA: Diagnosis not present

## 2014-03-17 ENCOUNTER — Encounter: Payer: Self-pay | Admitting: Radiation Oncology

## 2014-03-17 ENCOUNTER — Ambulatory Visit
Admit: 2014-03-17 | Discharge: 2014-03-17 | Disposition: A | Discharge status: Home / Self Care | Payer: BC Managed Care – PPO | Attending: Radiation Oncology | Admitting: Radiation Oncology

## 2014-03-17 DIAGNOSIS — Z510 Encounter for antineoplastic radiation therapy: Secondary | ICD-10-CM | POA: Diagnosis not present

## 2014-03-18 ENCOUNTER — Ambulatory Visit: Payer: BC Managed Care – PPO

## 2014-03-19 ENCOUNTER — Observation Stay (HOSPITAL_COMMUNITY)
Admission: EM | Admit: 2014-03-19 | Discharge: 2014-03-21 | Disposition: A | Discharge status: Home / Self Care | Payer: BC Managed Care – PPO | Attending: Internal Medicine | Admitting: Internal Medicine

## 2014-03-19 ENCOUNTER — Encounter (HOSPITAL_COMMUNITY): Payer: Self-pay | Admitting: Emergency Medicine

## 2014-03-19 DIAGNOSIS — J329 Chronic sinusitis, unspecified: Secondary | ICD-10-CM | POA: Insufficient documentation

## 2014-03-19 DIAGNOSIS — C7981 Secondary malignant neoplasm of breast: Secondary | ICD-10-CM | POA: Diagnosis not present

## 2014-03-19 DIAGNOSIS — Z7951 Long term (current) use of inhaled steroids: Secondary | ICD-10-CM | POA: Insufficient documentation

## 2014-03-19 DIAGNOSIS — F419 Anxiety disorder, unspecified: Secondary | ICD-10-CM | POA: Diagnosis not present

## 2014-03-19 DIAGNOSIS — K219 Gastro-esophageal reflux disease without esophagitis: Secondary | ICD-10-CM

## 2014-03-19 DIAGNOSIS — R531 Weakness: Secondary | ICD-10-CM

## 2014-03-19 DIAGNOSIS — K5909 Other constipation: Secondary | ICD-10-CM

## 2014-03-19 DIAGNOSIS — Z79899 Other long term (current) drug therapy: Secondary | ICD-10-CM | POA: Insufficient documentation

## 2014-03-19 DIAGNOSIS — K2100 Gastro-esophageal reflux disease with esophagitis, without bleeding: Secondary | ICD-10-CM

## 2014-03-19 DIAGNOSIS — E860 Dehydration: Secondary | ICD-10-CM | POA: Diagnosis not present

## 2014-03-19 DIAGNOSIS — K5900 Constipation, unspecified: Secondary | ICD-10-CM | POA: Diagnosis not present

## 2014-03-19 DIAGNOSIS — C7931 Secondary malignant neoplasm of brain: Secondary | ICD-10-CM

## 2014-03-19 DIAGNOSIS — G40109 Localization-related (focal) (partial) symptomatic epilepsy and epileptic syndromes with simple partial seizures, not intractable, without status epilepticus: Secondary | ICD-10-CM

## 2014-03-19 DIAGNOSIS — E43 Unspecified severe protein-calorie malnutrition: Secondary | ICD-10-CM | POA: Insufficient documentation

## 2014-03-19 DIAGNOSIS — C50911 Malignant neoplasm of unspecified site of right female breast: Secondary | ICD-10-CM

## 2014-03-19 DIAGNOSIS — R110 Nausea: Secondary | ICD-10-CM | POA: Insufficient documentation

## 2014-03-19 DIAGNOSIS — C719 Malignant neoplasm of brain, unspecified: Secondary | ICD-10-CM | POA: Diagnosis not present

## 2014-03-19 DIAGNOSIS — C50411 Malignant neoplasm of upper-outer quadrant of right female breast: Secondary | ICD-10-CM

## 2014-03-19 DIAGNOSIS — Z880 Allergy status to penicillin: Secondary | ICD-10-CM | POA: Insufficient documentation

## 2014-03-19 DIAGNOSIS — R627 Adult failure to thrive: Secondary | ICD-10-CM | POA: Diagnosis not present

## 2014-03-19 DIAGNOSIS — C50919 Malignant neoplasm of unspecified site of unspecified female breast: Secondary | ICD-10-CM | POA: Diagnosis present

## 2014-03-19 LAB — URINALYSIS, ROUTINE W REFLEX MICROSCOPIC
Bilirubin Urine: NEGATIVE
Glucose, UA: NEGATIVE mg/dL
Hgb urine dipstick: NEGATIVE
KETONES UR: NEGATIVE mg/dL
NITRITE: NEGATIVE
PH: 6.5 (ref 5.0–8.0)
Protein, ur: NEGATIVE mg/dL
SPECIFIC GRAVITY, URINE: 1.013 (ref 1.005–1.030)
Urobilinogen, UA: 1 mg/dL (ref 0.0–1.0)

## 2014-03-19 LAB — CBC WITH DIFFERENTIAL/PLATELET
BASOS ABS: 0 10*3/uL (ref 0.0–0.1)
BASOS PCT: 0 % (ref 0–1)
EOS ABS: 0 10*3/uL (ref 0.0–0.7)
Eosinophils Relative: 1 % (ref 0–5)
HEMATOCRIT: 35.5 % — AB (ref 36.0–46.0)
HEMOGLOBIN: 12.6 g/dL (ref 12.0–15.0)
Lymphocytes Relative: 12 % (ref 12–46)
Lymphs Abs: 1 10*3/uL (ref 0.7–4.0)
MCH: 33.9 pg (ref 26.0–34.0)
MCHC: 35.5 g/dL (ref 30.0–36.0)
MCV: 95.4 fL (ref 78.0–100.0)
MONO ABS: 0.5 10*3/uL (ref 0.1–1.0)
MONOS PCT: 6 % (ref 3–12)
Neutro Abs: 7 10*3/uL (ref 1.7–7.7)
Neutrophils Relative %: 82 % — ABNORMAL HIGH (ref 43–77)
Platelets: 111 10*3/uL — ABNORMAL LOW (ref 150–400)
RBC: 3.72 MIL/uL — ABNORMAL LOW (ref 3.87–5.11)
RDW: 13.6 % (ref 11.5–15.5)
WBC: 8.6 10*3/uL (ref 4.0–10.5)

## 2014-03-19 LAB — COMPREHENSIVE METABOLIC PANEL
ALBUMIN: 2.8 g/dL — AB (ref 3.5–5.2)
ALT: 41 U/L — ABNORMAL HIGH (ref 0–35)
ANION GAP: 10 (ref 5–15)
AST: 24 U/L (ref 0–37)
Alkaline Phosphatase: 85 U/L (ref 39–117)
BUN: 20 mg/dL (ref 6–23)
CALCIUM: 8.5 mg/dL (ref 8.4–10.5)
CO2: 28 mEq/L (ref 19–32)
CREATININE: 0.63 mg/dL (ref 0.50–1.10)
Chloride: 96 mEq/L (ref 96–112)
GFR calc Af Amer: >90 mL/min (ref >90)
GFR calc non Af Amer: >90 mL/min (ref >90)
Glucose, Bld: 98 mg/dL (ref 70–99)
Potassium: 4.1 mEq/L (ref 3.7–5.3)
Sodium: 134 mEq/L — ABNORMAL LOW (ref 137–147)
TOTAL PROTEIN: 5.6 g/dL — AB (ref 6.0–8.3)
Total Bilirubin: 0.5 mg/dL (ref 0.3–1.2)

## 2014-03-19 LAB — URINE MICROSCOPIC-ADD ON

## 2014-03-19 LAB — TSH: TSH: 0.058 u[IU]/mL — ABNORMAL LOW (ref 0.350–4.500)

## 2014-03-19 MED ORDER — ENSURE COMPLETE PO LIQD
237.0000 mL | Freq: Two times a day (BID) | ORAL | Status: DC
  Administered 2014-03-20 (×2): 237 mL via ORAL

## 2014-03-19 MED ORDER — MORPHINE SULFATE 2 MG/ML IJ SOLN: 1.0000 mg | INTRAMUSCULAR | Status: DC | PRN

## 2014-03-19 MED ORDER — SODIUM CHLORIDE 0.9 % IV BOLUS (SEPSIS)
1000.0000 mL | Freq: Once | INTRAVENOUS | Status: AC
  Administered 2014-03-19: 1000 mL via INTRAVENOUS

## 2014-03-19 MED ORDER — TRAZODONE HCL 100 MG PO TABS
100.0000 mg | ORAL_TABLET | Freq: Every day | ORAL | Status: DC
  Administered 2014-03-19 – 2014-03-20 (×2): 100 mg via ORAL
  Filled 2014-03-19 (×3): qty 1

## 2014-03-19 MED ORDER — LORAZEPAM 0.5 MG PO TABS: 0.5000 mg | ORAL_TABLET | Freq: Four times a day (QID) | ORAL | Status: DC | PRN

## 2014-03-19 MED ORDER — SODIUM CHLORIDE 0.9 % IV SOLN
INTRAVENOUS | Status: AC
  Administered 2014-03-19: 15:00:00 via INTRAVENOUS

## 2014-03-19 MED ORDER — POLYETHYLENE GLYCOL 3350 17 G PO PACK
17.0000 g | PACK | Freq: Two times a day (BID) | ORAL | Status: DC
  Administered 2014-03-19: 17 g via ORAL
  Filled 2014-03-19 (×3): qty 1

## 2014-03-19 MED ORDER — ACETAMINOPHEN 325 MG PO TABS: 650.0000 mg | ORAL_TABLET | Freq: Three times a day (TID) | ORAL | Status: DC | PRN

## 2014-03-19 MED ORDER — OLOPATADINE HCL 0.1 % OP SOLN
1.0000 [drp] | Freq: Two times a day (BID) | OPHTHALMIC | Status: DC
  Filled 2014-03-19: qty 5

## 2014-03-19 MED ORDER — ENOXAPARIN SODIUM 40 MG/0.4ML ~~LOC~~ SOLN
40.0000 mg | SUBCUTANEOUS | Status: DC
  Administered 2014-03-19 – 2014-03-20 (×2): 40 mg via SUBCUTANEOUS
  Filled 2014-03-19 (×3): qty 0.4

## 2014-03-19 MED ORDER — SODIUM CHLORIDE 0.9 % IV BOLUS (SEPSIS)
500.0000 mL | Freq: Once | INTRAVENOUS | Status: AC
  Administered 2014-03-19: 500 mL via INTRAVENOUS

## 2014-03-19 MED ORDER — DOCUSATE SODIUM 100 MG PO CAPS
100.0000 mg | ORAL_CAPSULE | Freq: Every day | ORAL | Status: DC
  Filled 2014-03-19: qty 1

## 2014-03-19 MED ORDER — SODIUM CHLORIDE 0.9 % IV SOLN
INTRAVENOUS | Status: DC
  Administered 2014-03-20: 01:00:00 via INTRAVENOUS

## 2014-03-19 MED ORDER — BISACODYL 10 MG RE SUPP: 10.0000 mg | Freq: Every day | RECTAL | Status: DC | PRN

## 2014-03-19 MED ORDER — TRAMADOL HCL 50 MG PO TABS
100.0000 mg | ORAL_TABLET | Freq: Four times a day (QID) | ORAL | Status: DC
  Administered 2014-03-19 – 2014-03-20 (×2): 100 mg via ORAL
  Filled 2014-03-19 (×3): qty 2

## 2014-03-19 MED ORDER — DEXAMETHASONE 4 MG PO TABS
4.0000 mg | ORAL_TABLET | Freq: Two times a day (BID) | ORAL | Status: DC
  Administered 2014-03-19 – 2014-03-21 (×4): 4 mg via ORAL
  Filled 2014-03-19 (×5): qty 1

## 2014-03-19 MED ORDER — PANTOPRAZOLE SODIUM 40 MG PO TBEC
40.0000 mg | DELAYED_RELEASE_TABLET | Freq: Every day | ORAL | Status: DC
  Administered 2014-03-20 – 2014-03-21 (×2): 40 mg via ORAL
  Filled 2014-03-19 (×2): qty 1

## 2014-03-19 MED ORDER — BIOTENE DRY MOUTH MT LIQD: 15.0000 mL | OROMUCOSAL | Status: DC | PRN

## 2014-03-19 MED ORDER — MIRTAZAPINE 15 MG PO TABS
15.0000 mg | ORAL_TABLET | Freq: Every day | ORAL | Status: DC
  Administered 2014-03-19: 15 mg via ORAL
  Filled 2014-03-19 (×2): qty 1

## 2014-03-19 MED ORDER — FLUTICASONE PROPIONATE 50 MCG/ACT NA SUSP
1.0000 | Freq: Every day | NASAL | Status: DC
  Filled 2014-03-19: qty 16

## 2014-03-19 MED ORDER — LEVETIRACETAM 500 MG PO TABS
1000.0000 mg | ORAL_TABLET | Freq: Two times a day (BID) | ORAL | Status: DC
  Administered 2014-03-19 – 2014-03-21 (×4): 1000 mg via ORAL
  Filled 2014-03-19 (×5): qty 2

## 2014-03-19 MED ORDER — PROCHLORPERAZINE MALEATE 5 MG PO TABS
5.0000 mg | ORAL_TABLET | Freq: Three times a day (TID) | ORAL | Status: DC
  Administered 2014-03-19 (×2): 5 mg via ORAL
  Filled 2014-03-19 (×6): qty 1

## 2014-03-19 NOTE — ED Notes (Signed)
Pt refusing to be catheterized.

## 2014-03-19 NOTE — ED Notes (Signed)
Meal ordered for pt per request.

## 2014-03-19 NOTE — Progress Notes (Addendum)
  CARE MANAGEMENT ED NOTE 03/19/2014  Patient:  Carrie Berry, Carrie Berry   Account Number:  [id number]  Date Initiated:  03/19/2014  Documentation initiated by:  Jackelyn Poling  Subjective/Objective Assessment:   63 [facility] old [facility] dehydrated & constipated for 3 weeks-saw PCP & was told she was dehydrated-not consuming any water-urinating every 12 hours-in rehab related to cancer treatment-facility not monitoring intake very well pe     Subjective/Objective Assessment Detail:   pcp [facility] Carol has not seen pt in awhile  [facility] note in EPIC from 03/14/14 indicating breast cancer in 12/2013 with metastases to the liver and lung with Bm improve  Agreed to home health services with [facility] as choice of home health provider  Discussed with CM concerns addressed a snf with snf administrator  Permission given to speak with Lonna Duval, family member about change in level of care  Refused PDN list states has a female family member who is a CNA that can stay with pt  Husband mentioned that his mother had hospice for 6 months and then released Understood levels of hospice services but does not want active hospice services at this time     Action/Plan:   1125 ED CM consulted by ED RN about concerns of family about pt not receiving care at [facility]. CM spoke with family member, CM Spoke with EDP, Steinl who also wants pt evaluated for home services Spoke with pt & spouse   Action/Plan Detail:   ED Cm updated family, Sent pcp & Dr [name] EPIC in basket update note. Referral sent to [facility] for hhr[facility] & PT Pending EDP orders Ombudsman info provided   Anticipated DC Date:  03/19/2014     Status Recommendation to Physician:   Result of Recommendation:    Other ED [place]  Other  Outpatient Services - Pt will follow up   [place]   Choice offered to / List  presented to:  C-1 Patient  DME arranged  3-N-1  Vassie Moselle     DME agency  [place] arranged  [place] RN      [place].    Status of service:  Completed, signed off  ED Comments:   ED Comments Detail:  03/19/14 1340 CM called and spoke with Pura Spice, DME [facility] coordinator, about need of a RW & 3N1 (noted to be recommended on last d/c and pt informed CM she did not have them. Husband not present in the room at this time Pt with lunch tray in front of her but does not appear to be touched.  Pt informed CM she was ok when Cm inquired and some word finding issues noted by CM Nodding head with verbal yes and no   CM reviewed in details medicare guidelines, home health [facility]) (length of stay in home, types of [facility] staff available, coverage, primary caregiver, up to 24 hrs before services may be started), Private duty nursing (PDN-coverage, length of stay in the home types of staff available),  CM provided family with a list of [place] home health agencies, and PDN.

## 2014-03-19 NOTE — Progress Notes (Signed)
Pt. Was able to void 800cc's of urine in the bedside commode. Pt. Was then bladder scanned  per MD orders and 268cc's were left in the bladder.

## 2014-03-19 NOTE — ED Provider Notes (Signed)
CSN: [id number]     Arrival date & time 03/19/14  3235 History   First MD Initiated Contact with Patient 03/19/14 1003     Chief Complaint  Patient presents with  . Constipation  . Dehydration     (Consider location/radiation/quality/duration/timing/severity/associated sxs/prior Treatment) Patient is a 63 y.o. female presenting with constipation. The history is provided by the patient and the spouse.  Constipation Associated symptoms: nausea   Associated symptoms: no abdominal pain, no back pain, no dysuria, no fever and no vomiting   pt with stage IV breast ca, mets to liver, lung and brain, w poor po intake for past couple weeks, generalized weakness, decreased appetite.  Pt was recently admitted for same at which time dx brain mets made.  Pt has had radiation tx and is on decadron.  [facility] currently at [facility]. Pt/spouse unhappy w care at [facility], so plan to return pt to home, but feel pt too weak and dehydrated. Pt denies pain. No current headache, although notes recent headaches related to brain mets. Denies cough or uri co. No fever or chills. No cp or discomfort. No sob. No abd pain. No bm x past 2-3 days, but states has eaten/drank very little. No abd pain or distension. No dysuria, and is urinated less frequently than normal. Compliant w meds.       Past Medical History  Diagnosis Date  . Recurrent sinus infections   . Arthritis   . PONV (postoperative nausea and vomiting)   . GERD (gastroesophageal reflux disease)   . 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 Anxiety     no problems at present  . rt breast ca dx'd 12/2011    breast  . Breast cancer   . 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 Tubal ligation    . Tubal ligation  1985  . Portacath placement N/A 01/10/2013    Procedure: ULTRASOUND GUIDED PORT-A-CATH INSERTION WITH FLUOROSCOPY;  Surgeon: [name], MD;  Location: [facility];  Service: General;  Laterality: N/A;  . Esophagogastroduodenoscopy N/A 08/19/2013    Procedure: ESOPHAGOGASTRODUODENOSCOPY (EGD);  Surgeon: Garlan Fair, MD;  Location: [facility];  Service: Endoscopy;  Laterality: N/A;  . Total mastectomy Right 02/17/2014    Procedure: PALLIATIVE  RIGHT MASTECTOMY;  Surgeon: [name], MD;  Location: [place];  Service: General;  Laterality: Right;   Family History  Problem Relation Age of Onset  . Bladder Cancer Father   . Colon cancer Maternal Grandmother    History  Substance Use Topics  . Smoking status: Never Smoker   . Smokeless tobacco: Never Used  . Alcohol Use: No   OB History    No data available     Review of Systems  Constitutional: Negative for fever and chills.  HENT: Negative for sore throat.   Eyes: Negative for redness.  Respiratory: Negative for cough and shortness of breath.   Cardiovascular: Negative for chest pain and leg swelling.  Gastrointestinal: Positive for nausea and constipation. Negative for vomiting, abdominal pain and abdominal distention.  Endocrine: Negative for polyuria.  Genitourinary: Negative for dysuria and flank pain.  Musculoskeletal: Negative for back pain and neck pain.  Skin: Negative for rash.  Neurological: Negative for numbness.  Hematological: Does not bruise/bleed easily.  Psychiatric/Behavioral: Negative for confusion.      Allergies  Ondansetron hcl; Codeine; and Penicillins  Home Medications   Prior to  Admission medications   Medication Sig Start Date End Date Taking? Authorizing Provider  acetaminophen (TYLENOL) 325 MG tablet Take 650 mg by mouth every 8 (eight) hours as needed for pain.    Historical Provider, MD  dexamethasone (DECADRON) 4 MG tablet Take 1 tablet (4 mg total) by mouth every 12 (twelve) hours. Continue 4 mg twice daily for one week, then 2 mg twice daily for two weeks, the 2 mg once daily for two weeks, then stop. 03/13/14   Lora Paula, MD   docusate sodium 100 MG CAPS Take 100 mg by mouth daily. 03/10/14   Robbie Lis, MD  levETIRAcetam (KEPPRA) 1000 MG tablet Take 1 tablet (1,000 mg total) by mouth 2 (two) times daily. 03/12/14   Gerlene Fee, NP  LORazepam (ATIVAN) 0.5 MG tablet Take 1 tablet (0.5 mg total) by mouth every 6 (six) hours as needed for anxiety. 03/11/14   Mahima Bubba Camp, MD  ondansetron (ZOFRAN) 8 MG tablet Take 8 mg by mouth every 8 (eight) hours as needed for nausea or vomiting.    Historical Provider, MD  pantoprazole (PROTONIX) 40 MG tablet Take 1 tablet (40 mg total) by mouth daily. 03/10/14   Robbie Lis, MD  PATADAY 0.2 % SOLN Place 1 drop into both eyes daily as needed (dry eyes).  10/29/12   Historical Provider, MD  polyethylene glycol (MIRALAX / GLYCOLAX) packet Take 17 g by mouth daily. 03/12/14   Gerlene Fee, NP  prochlorperazine (COMPAZINE) 5 MG tablet Take 1 tablet (5 mg total) by mouth 4 (four) times daily -  before meals and at bedtime. 03/10/14   Robbie Lis, MD  tobramycin-dexamethasone [facility]) ophthalmic solution Place 1 drop into both eyes daily as needed. For dry eyes per patient 04/29/13   Theotis Burrow, PA-C  traMADol (ULTRAM) 50 MG tablet Take 2 tablets (100 mg total) by mouth every 6 (six) hours. 03/11/14   Blanchie Serve, MD  traZODone (DESYREL) 50 MG tablet Take 2 tablets (100 mg total) by mouth at bedtime. 03/10/14   Robbie Lis, MD  triamcinolone (NASACORT) 55 MCG/ACT nasal inhaler Place 2 sprays into the nose daily.    Historical Provider, MD   BP 92/64 mmHg  Pulse 96  Temp(Src) 97.9 F (36.6 C) (Oral)  Resp 16  SpO2 100% Physical Exam  Constitutional: She is oriented to person, place, and time. She appears well-developed and well-nourished. No distress.  HENT:  Head: Atraumatic.  Mouth/Throat: Oropharynx is clear and moist.  Eyes: Conjunctivae are normal. Pupils are equal, round, and reactive to light. No scleral icterus.  Neck: Neck supple. No tracheal deviation  present.  No stiffness or rigidity  Cardiovascular: Normal rate, regular rhythm, normal heart sounds and intact distal pulses.   Pulmonary/Chest: Effort normal and breath sounds normal. No respiratory distress.  Abdominal: Soft. Normal appearance and bowel sounds are normal. She exhibits no distension and no mass. There is no tenderness. There is no rebound and no guarding.  Genitourinary:  No cva tenderness  Musculoskeletal: She exhibits no edema.  Neurological: She is alert and oriented to person, place, and time.  Skin: Skin is warm and dry. No rash noted. She is not diaphoretic.  Psychiatric: She has a normal mood and affect.  Nursing note and vitals reviewed.   ED Course  Procedures (including critical care time) Labs Review  Results for orders placed or performed during the hospital encounter of 03/19/14  CBC WITH DIFFERENTIAL  Result Value Ref  Range   WBC 8.6 4.0 - 10.5 K/uL   RBC 3.72 (L) 3.87 - 5.11 MIL/uL   Hemoglobin 12.6 12.0 - 15.0 g/dL   HCT 35.5 (L) 36.0 - 46.0 %   MCV 95.4 78.0 - 100.0 fL   MCH 33.9 26.0 - 34.0 pg   MCHC 35.5 30.0 - 36.0 g/dL   RDW 13.6 11.5 - 15.5 %   Platelets 111 (L) 150 - 400 K/uL   Neutrophils Relative % 82 (H) 43 - 77 %   Neutro Abs 7.0 1.7 - 7.7 K/uL   Lymphocytes Relative 12 12 - 46 %   Lymphs Abs 1.0 0.7 - 4.0 K/uL   Monocytes Relative 6 3 - 12 %   Monocytes Absolute 0.5 0.1 - 1.0 K/uL   Eosinophils Relative 1 0 - 5 %   Eosinophils Absolute 0.0 0.0 - 0.7 K/uL   Basophils Relative 0 0 - 1 %   Basophils Absolute 0.0 0.0 - 0.1 K/uL  Comprehensive metabolic panel  Result Value Ref Range   Sodium 134 (L) 137 - 147 mEq/L   Potassium 4.1 3.7 - 5.3 mEq/L   Chloride 96 96 - 112 mEq/L   CO2 28 19 - 32 mEq/L   Glucose, Bld 98 70 - 99 mg/dL   BUN 20 6 - 23 mg/dL   Creatinine, Ser 0.63 0.50 - 1.10 mg/dL   Calcium 8.5 8.4 - 10.5 mg/dL   Total Protein 5.6 (L) 6.0 - 8.3 g/dL   Albumin 2.8 (L) 3.5 - 5.2 g/dL   AST 24 0 - 37 U/L   ALT 41  (H) 0 - 35 U/L   Alkaline Phosphatase 85 39 - 117 U/L   Total Bilirubin 0.5 0.3 - 1.2 mg/dL   GFR calc non Af Amer >90 >90 mL/min   GFR calc Af Amer >90 >90 mL/min   Anion gap 10 5 - 15   Dg Chest 2 View  02/25/2014   CLINICAL DATA:  63 year old female with right mastectomy 1 week prior.  EXAM: CHEST - 2 VIEW  COMPARISON:  06/24/2013  FINDINGS: Cardiomediastinal silhouette unchanged in size and contour.  Unchanged position of right IJ port catheter with the tip terminating in the superior vena cava.  Surgical changes of right mastectomy with surgical clips overlying right hemi thorax.  As was mentioned on prior plain film, pulmonary nodules are difficult to resolve with plain film imaging. These are better characterized on prior CT. No confluent airspace disease, pneumothorax, or pleural effusion.  No displaced fracture.  IMPRESSION: No radiographic evidence of acute cardiopulmonary disease.  Unchanged position of right IJ port catheter.  Interval surgical changes of right mastectomy.  Signed,  Dulcy Fanny. Earleen Newport, DO  [facility]  [facility]   Electronically Signed   By: Corrie Mckusick D.O.   On: 02/25/2014 19:56   Ct Head Wo Contrast  02/25/2014   CLINICAL DATA:  63 year old female with right mastectomy 1 week prior.  EXAM: CT HEAD WITHOUT CONTRAST  TECHNIQUE: Contiguous axial images were obtained from the base of the skull through the vertex without intravenous contrast.  COMPARISON:  MRI 07/15/2013  FINDINGS: Unremarkable appearance of the calvarium with no displaced fracture or aggressive lesion.  Trace fluid within the right mastoid air cells. Left mastoid air cells are clear.  No significant paranasal sinus disease.  Unremarkable appearance of the bilateral orbits.  Geographic regions of hypodensity within the white matter the bilateral hemispheres. On the right this is predominantly  temporoparietal, though it extends into the occipital lobe on the right  and there is a focal region in the right frontal white matter. This edema contributes to mass effect and subtle midline shift measuring approximately 11 mm at the foramen of Monro.  There is a mixed density rounded lesion measuring 13 mm within the right parietal lobe (image 17), as well as a smaller hyperdensity in the right occipital lobe (image 16.)  To a lesser degree there is confluent hypodensity within the left posterior frontal lobe white matter as well as the left high parietal white matter.  Focal hyperdensity near the anterior limb of the right internal capsule (image 13).  Heterogeneous focus in the right cerebellar hemisphere (image 7).  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 The right-sided edema is worse, and contributes to 1 cm of right to left midline shift at the foramen of Monro. Further evaluation with contrast MRI is recommended.  These results were called by telephone at the time of interpretation on 02/25/2014 at 8:30 pm to Dr. Davonna Belling , who verbally acknowledged these results.  Signed,  Dulcy Fanny. Earleen Newport, DO  [facility]  [facility]   Electronically Signed   By: Corrie Mckusick D.O.   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 Status post palliative mastectomy 02/17/2014. Back pain on the left with nausea.  EXAM: CT CHEST, ABDOMEN, AND PELVIS WITH CONTRAST  TECHNIQUE: Multidetector CT imaging of the chest, abdomen and pelvis was performed following the standard protocol during bolus administration of intravenous contrast.  CONTRAST:  177mL OMNIPAQUE IOHEXOL 300 MG/ML  SOLN  COMPARISON:  Chest CT and abdomen scan 07/15/2013. PET CT scan 01/07/2013.  FINDINGS: CT CHEST FINDINGS  Since the prior chest CT, the patient has  undergone right mastectomy. There is a small amount of gas the soft tissues from the procedure and surgical staples are in place. A small amount of simple appearing fluid is also identified.  A right axillary lymph node measuring 0.6 cm is seen. There is no fatty hilum within the node. The node is seen on the prior study, but had a clearly visible fatty hilum. Tiny right paratracheal node seen on the prior examination is unchanged. No pathologically enlarged lymph nodes are seen in the hila or mediastinum. There is no pleural or pericardial effusion. Heart size is upper normal.  There has been progression of pulmonary metastases. A new left lower lobe pulmonary nodule on image 30 measures 0.8 x 0.6 cm. A new left upper lobe nodule on image 22 measures 0.4 cm. Right upper lobe pulmonary nodule which had measured 0.7 x 0.5 cm today measures 0.8 x 0.6 cm on image 20. Tiny subpleural nodule along the major fissure on the left seen on the prior study is unchanged on image 16. No focal bony abnormality is identified.  CT ABDOMEN AND PELVIS FINDINGS  Previously seen 0.9 cm low attenuating lesion the right hepatic lobe today measures 0.5 cm. Lesion in the left hepatic lobe which had measured 0.6 cm today measures 1.5 cm on image 58. Additional liver lesions do not appear markedly changed. No new liver lesion is identified.  The adrenal glands, spleen, gallbladder, biliary tree and pancreas appear normal. Mild prominence of the left renal pelvis seen on the prior study  appear slightly increased. Abnormal axis orientation of the right kidney is noted. Small amount of free pelvic fluid is present. Urinary bladder, uterus adnexa are unremarkable. The stomach and small and large bowel appear normal. No lymphadenopathy is identified. No sclerotic lesion is seen.  IMPRESSION: Interval progression of pulmonary and hepatic metastases as detailed above.  Status post right mastectomy without complicating feature.  Trace amount of free  pelvic fluid of uncertain etiology.   Electronically Signed   By: Inge Rise M.D.   On: 02/28/2014 12:20   Mr Jeri Cos RJ Contrast  02/26/2014   CLINICAL DATA:  Brain metastases.  Stage IV breast cancer.  EXAM: MRI HEAD WITHOUT AND WITH CONTRAST  TECHNIQUE: Multiplanar, multiecho pulse sequences of the brain and surrounding structures were obtained without and with intravenous contrast.  CONTRAST:  32mL MULTIHANCE GADOBENATE DIMEGLUMINE 529 MG/ML IV SOLN  COMPARISON:  CT head without contrast 02/25/2014. MRI brain 07/15/2013.  FINDINGS: Multiple enhancing mass lesions are present bilaterally, more prominent right than left. Peripheral restricted diffusion is evident within multiple lesions.  A right cerebellar lesion measures 16 x 18 mm with significant surrounding edema.  A 5 mm left temporal lobe lesion is present on image 11. Bilateral occipital lobe lesions are present on image 12. The lesion on the left measures 6.5 x 8.0 mm lesion on the right measures 3 mm. The posterior right temporal lobe lesion on image 13 measures 15 x 12 mm. A posterior left frontal lobe lesion measures 18 x 21 x 18 mm. A right parietal lesion on image 15 measures 6 mm. Additional lesions in the right frontal and right parietal lobe are contained within an an area of confluent T2 hyperintensity in the right frontal and parietal lobe. A more anterior right frontal lobe lesion measures 6 mm on image 19. A high posterior left frontal lobe lesion measures 5.5 mm with surrounding edema. A more inferior left frontal lobe lesion measures 5.5 mm on image 21. And 6.5 mm lesion is present in the left parietal lobe. A peripheral smaller left parietal lesion is present on image 19. A punctate lesion is present within the left lentiform nucleus.  The vasogenic edema results in significant midline shift from the left to the right measuring 10 mm at the foramen of Monro. The basal cisterns are intact.  Flow is present in the major intracranial  arteries. The globes and orbits are intact. The paranasal sinuses are clear. There is some fluid in the mastoid air cells bilaterally, left greater than right.  IMPRESSION: 1. Multiple enhancing mass lesions scattered throughout the brain. The largest concentration is in the posterior right frontal and right parietal lobe. 2. The largest lesion is in the posterior right frontal lobe, measuring 18 x 21 x 18 mm. 3. A more lateral right occipital lesion measures 17 x 15 x 12 mm. 4. High posterior left frontal lobe measuring 5.5 mm. 5. 18 mm right cerebellar lesion. 6. Significant midline shift measures 5 mm at the foramen of Monro.   Electronically Signed   By: Lawrence Santiago M.D.   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 Status post palliative mastectomy 02/17/2014. Back pain on the left with nausea.  EXAM: CT CHEST, ABDOMEN, AND PELVIS WITH CONTRAST  TECHNIQUE: Multidetector CT imaging of the chest, abdomen and pelvis was performed following the standard  protocol during bolus administration of intravenous contrast.  CONTRAST:  145mL OMNIPAQUE IOHEXOL 300 MG/ML  SOLN  COMPARISON:  Chest CT and abdomen scan 07/15/2013. PET CT scan 01/07/2013.  FINDINGS: CT CHEST FINDINGS  Since the prior chest CT, the patient has undergone right mastectomy. There is a small amount of gas the soft tissues from the procedure and surgical staples are in place. A small amount of simple appearing fluid is also identified.  A right axillary lymph node measuring 0.6 cm is seen. There is no fatty hilum within the node. The node is seen on the prior study, but had a clearly visible fatty hilum. Tiny right paratracheal node seen on the prior examination is unchanged. No pathologically enlarged lymph nodes are seen in the hila or mediastinum. There is no pleural or pericardial effusion. Heart size is  upper normal.  There has been progression of pulmonary metastases. A new left lower lobe pulmonary nodule on image 30 measures 0.8 x 0.6 cm. A new left upper lobe nodule on image 22 measures 0.4 cm. Right upper lobe pulmonary nodule which had measured 0.7 x 0.5 cm today measures 0.8 x 0.6 cm on image 20. Tiny subpleural nodule along the major fissure on the left seen on the prior study is unchanged on image 16. No focal bony abnormality is identified.  CT ABDOMEN AND PELVIS FINDINGS  Previously seen 0.9 cm low attenuating lesion the right hepatic lobe today measures 0.5 cm. Lesion in the left hepatic lobe which had measured 0.6 cm today measures 1.5 cm on image 58. Additional liver lesions do not appear markedly changed. No new liver lesion is identified.  The adrenal glands, spleen, gallbladder, biliary tree and pancreas appear normal. Mild prominence of the left renal pelvis seen on the prior study appear slightly increased. Abnormal axis orientation of the right kidney is noted. Small amount of free pelvic fluid is present. Urinary bladder, uterus adnexa are unremarkable. The stomach and small and large bowel appear normal. No lymphadenopathy is identified. No sclerotic lesion is seen.  IMPRESSION: Interval progression of pulmonary and hepatic metastases as detailed above.  Status post right mastectomy without complicating feature.  Trace amount of free pelvic fluid of uncertain etiology.   Electronically Signed   By: Inge Rise M.D.   On: 02/28/2014 12:20   Dg Abd Portable 1v  03/08/2014   CLINICAL DATA:  Constipation.  EXAM: PORTABLE ABDOMEN - 1 VIEW  COMPARISON:  CT abdomen and pelvis 02/28/2014.  FINDINGS: Oral contrast from patient's CT scan is in the descending colon to the rectum where a moderate stool burden is identified. The bowel gas pattern is nonobstructive.  IMPRESSION: No acute finding. Moderate stool burden descending colon to the rectum.   Electronically Signed   By: Inge Rise  M.D.   On: 03/08/2014 18:30       MDM  Iv ns bolus. Labs.  Reviewed nursing notes and prior charts for additional history.   2.5 liters ns in ED.  Still no urine output.  Pt refuses cath.  abd soft nt.  Discussed with Dr [name] - he indicates would be reasonable to do obs/admit, hydrate w ivf, to med service.  hospitalist paged.    Mirna Mires, MD 03/19/14 [id number]

## 2014-03-19 NOTE — ED Notes (Signed)
Pt provided meal

## 2014-03-19 NOTE — ED Notes (Signed)
Pt asked multiple times for a urine sample. Pt refusing to try to provide a urine sample at this time. Ashok Cordia, MD aware.

## 2014-03-19 NOTE — H&P (Signed)
[facility] History and Physical  Carrie Berry:[id number] DOB: January 05, 1951 DOA: 03/19/2014  Referring physician: Dr Coralyn Pear.  PCP: [name], MD   Chief Complaint:   HPI: Carrie Berry is a 63 y.o. 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 Patient denies chest pain, dyspnea, no BM in the last 3 days. She denies odynophagia, dysphagia, abdominal pain, she is passing gas.  Evaluation in the ED. Sodium at 134, UA with 7-10 WBC.   Review of Systems:  Negative, except as per HPI.   Past Medical History  Diagnosis Date  . Recurrent sinus infections   . Arthritis   . PONV (postoperative nausea and vomiting)   . GERD (gastroesophageal reflux disease)   . 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 Anxiety     no problems at present  . rt breast ca dx'd 12/2011    breast  . Breast cancer   . 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 Tubal ligation    . Tubal ligation  1985  . Portacath placement N/A 01/10/2013    Procedure: ULTRASOUND GUIDED PORT-A-CATH INSERTION WITH FLUOROSCOPY;  Surgeon: [name], MD;  Location: [place];  Service: General;  Laterality: N/A;  . Esophagogastroduodenoscopy N/A 08/19/2013    Procedure: ESOPHAGOGASTRODUODENOSCOPY (EGD);  Surgeon: Garlan Fair, MD;  Location: [facility];  Service: Endoscopy;  Laterality: N/A;  . Total mastectomy Right 02/17/2014    Procedure: PALLIATIVE  RIGHT MASTECTOMY;  Surgeon: [name], MD;  Location: [place];  Service: General;  Laterality: Right;   Social History:  reports that she has never smoked. She has never used smokeless tobacco. She reports that she does not drink alcohol or use illicit drugs.  Allergies  Allergen  Reactions  . Ondansetron Hcl Other (See Comments)    HEADACHE, patient states she still takes   . Codeine Nausea And Vomiting    "violently ill"  . Penicillins Rash    Childhood reaction -    Family History  Problem Relation Age of Onset  . Bladder Cancer Father   . Colon cancer Maternal Grandmother      Prior to Admission medications   Medication Sig Start Date End Date Taking? Authorizing Provider  acetaminophen (TYLENOL) 325 MG tablet Take 650 mg by mouth every 8 (eight) hours as needed for pain.   Yes Historical Provider, MD  docusate sodium 100 MG CAPS Take 100 mg by mouth daily. 03/10/14  Yes Robbie Lis, MD  levETIRAcetam (KEPPRA) 1000 MG tablet Take 1 tablet (1,000 mg total) by mouth 2 (two) times daily. 03/12/14  Yes Gerlene Fee, NP  mirtazapine (REMERON) 15 MG tablet Take 15 mg by mouth at bedtime.   Yes Historical Provider, MD  pantoprazole (PROTONIX) 40 MG tablet Take 1 tablet (40 mg total) by mouth daily. 03/10/14  Yes Robbie Lis, MD  polyethylene glycol [facility] / Floria Raveling) packet Take 17 g by mouth daily. 03/12/14  Yes Gerlene Fee, NP  prochlorperazine (COMPAZINE) 5 MG tablet Take 1 tablet (5 mg total) by mouth 4 (four) times daily -  before meals and at bedtime. 03/10/14  Yes Robbie Lis, MD  tobramycin-dexamethasone [facility]) ophthalmic solution Place 1 drop into both eyes daily as needed. For dry eyes  per patient 04/29/13  Yes Amy Milda Smart, PA-C  traMADol (ULTRAM) 50 MG tablet Take 2 tablets (100 mg total) by mouth every 6 (six) hours. 03/11/14  Yes Mahima Bubba Camp, MD  traZODone (DESYREL) 50 MG tablet Take 2 tablets (100 mg total) by mouth at bedtime. 03/10/14  Yes Robbie Lis, MD  triamcinolone (NASACORT) 55 MCG/ACT nasal inhaler Place 2 sprays into the nose daily.   Yes Historical Provider, MD  dexamethasone (DECADRON) 4 MG tablet Take 1 tablet (4 mg total) by mouth every 12 (twelve) hours. Continue 4 mg twice daily for one week, then 2 mg twice daily  for two weeks, the 2 mg once daily for two weeks, then stop. 03/13/14   Lora Paula, MD  LORazepam (ATIVAN) 0.5 MG tablet Take 1 tablet (0.5 mg total) by mouth every 6 (six) hours as needed for anxiety. 03/11/14   Mahima Bubba Camp, MD  ondansetron (ZOFRAN) 8 MG tablet Take 8 mg by mouth every 8 (eight) hours as needed for nausea or vomiting.    Historical Provider, MD  PATADAY 0.2 % SOLN Place 1 drop into both eyes daily as needed (dry eyes).  10/29/12   Historical Provider, MD   Physical Exam: Filed Vitals:   03/19/14 0956 03/19/14 1259 03/19/14 1504 03/19/14 1547  BP: 92/64 116/65 108/56   Pulse: 96 73 70   Temp: 97.9 F (36.6 C)     TempSrc: Oral     Resp: 16 16 16    Height:    5\' 3"  (1.6 m)  Weight:    48.988 kg (108 lb)  SpO2: 100% 99% 96%     Wt Readings from Last 3 Encounters:  03/19/14 48.988 kg (108 lb)  03/13/14 48.988 kg (108 lb)  03/12/14 48.988 kg (108 lb)    General:  Appears calm and comfortable, thin appearing.  Eyes: PERRL, normal lids, irises & conjunctiva ENT: grossly normal hearing, lips & tongue dry.  Neck: no LAD, masses or thyromegaly Cardiovascular: RRR, no m/r/g. No LE edema. Respiratory: CTA bilaterally, no w/r/r. Normal respiratory effort. Abdomen: soft, ntnd Skin: no rash or induration seen on limited exam Musculoskeletal: grossly normal tone BUE/BLE Psychiatric: grossly normal mood and affect, speech fluent and appropriate           Labs on Admission:  Basic Metabolic Panel:  Recent Labs Lab 03/19/14 1049  NA 134*  K 4.1  CL 96  CO2 28  GLUCOSE 98  BUN 20  CREATININE 0.63  CALCIUM 8.5   Liver Function Tests:  Recent Labs Lab 03/19/14 1049  AST 24  ALT 41*  ALKPHOS 85  BILITOT 0.5  PROT 5.6*  ALBUMIN 2.8*   No results for input(s): LIPASE, AMYLASE in the last 168 hours. No results for input(s): AMMONIA in the last 168 hours. CBC:  Recent Labs Lab 03/19/14 1049  WBC 8.6  NEUTROABS 7.0  HGB 12.6  HCT 35.5*  MCV  95.4  PLT 111*   Cardiac Enzymes: No results for input(s): CKTOTAL, CKMB, CKMBINDEX, TROPONINI in the last 168 hours.  BNP (last 3 results) No results for input(s): PROBNP in the last 8760 hours. CBG: No results for input(s): GLUCAP in the last 168 hours.  Radiological Exams on Admission: No results found.  Assessment/Plan Active Problems:   Breast cancer metastasized to brain   FTT (failure to thrive) in adult  1-FTT; Patient presents with decrease appetite, weakness. Suspect secondary to malignancy. Will check Urine culture, although patient afebrile, normal WBC.  Should  we start Marinol for appetite, will need to discussed with Dr [name].  Nutrition consulted.  Check TSH.  She was recently started on Remeron to help with appetite.    2-History Breast cancer with metastasis to Brain: Continue with decadron, and Keppra. Dr [name] added to rounding list.   3-Dehydration: Patient with poor oral intake. Mild Hyponatremia. Will continue with IV fluids.   4-Low TSH; check Free T 3 and T 4.   5-Constipation; start miralax, docusate. Dulcolax suppository.  6-Pyuria: will order urine culture.  Patient will need PT, OT evaluation, plan in place for safe discharge.   Code Status: Full Code.  DVT Prophylaxis:Lovenox.  Family Communication: Care discussed with husband  Disposition Plan: expect 1 to 2 days in hospital.   Time spent: 75 minutes.   Niel Hummer A [facility] Pager [phone]

## 2014-03-19 NOTE — ED Notes (Addendum)
Per pt, states dehydrated and constipated for 3 weeks-saw PCP and was told she was dehydrated-not consuming any water-urinating every 12 hours-in rehab related to cancer treatment-facility not monitoring intake very well per husband

## 2014-03-20 ENCOUNTER — Telehealth: Payer: Self-pay | Admitting: Oncology

## 2014-03-20 ENCOUNTER — Other Ambulatory Visit: Payer: Self-pay | Admitting: Oncology

## 2014-03-20 ENCOUNTER — Encounter: Payer: Self-pay | Admitting: Oncology

## 2014-03-20 DIAGNOSIS — C7800 Secondary malignant neoplasm of unspecified lung: Secondary | ICD-10-CM

## 2014-03-20 DIAGNOSIS — E43 Unspecified severe protein-calorie malnutrition: Secondary | ICD-10-CM | POA: Insufficient documentation

## 2014-03-20 DIAGNOSIS — C787 Secondary malignant neoplasm of liver and intrahepatic bile duct: Secondary | ICD-10-CM

## 2014-03-20 DIAGNOSIS — R946 Abnormal results of thyroid function studies: Secondary | ICD-10-CM

## 2014-03-20 DIAGNOSIS — E46 Unspecified protein-calorie malnutrition: Secondary | ICD-10-CM

## 2014-03-20 DIAGNOSIS — C50911 Malignant neoplasm of unspecified site of right female breast: Secondary | ICD-10-CM

## 2014-03-20 DIAGNOSIS — C50411 Malignant neoplasm of upper-outer quadrant of right female breast: Secondary | ICD-10-CM

## 2014-03-20 DIAGNOSIS — F329 Major depressive disorder, single episode, unspecified: Secondary | ICD-10-CM

## 2014-03-20 LAB — COMPREHENSIVE METABOLIC PANEL
ALBUMIN: 2.3 g/dL — AB (ref 3.5–5.2)
ALK PHOS: 70 U/L (ref 39–117)
ALT: 34 U/L (ref 0–35)
AST: 23 U/L (ref 0–37)
Anion gap: 7 (ref 5–15)
BILIRUBIN TOTAL: 0.4 mg/dL (ref 0.3–1.2)
BUN: 12 mg/dL (ref 6–23)
CHLORIDE: 103 meq/L (ref 96–112)
CO2: 27 mEq/L (ref 19–32)
Calcium: 8.1 mg/dL — ABNORMAL LOW (ref 8.4–10.5)
Creatinine, Ser: 0.48 mg/dL — ABNORMAL LOW (ref 0.50–1.10)
GFR calc Af Amer: >90 mL/min (ref >90)
GFR calc non Af Amer: >90 mL/min (ref >90)
Glucose, Bld: 88 mg/dL (ref 70–99)
Potassium: 3.9 mEq/L (ref 3.7–5.3)
SODIUM: 137 meq/L (ref 137–147)
Total Protein: 4.6 g/dL — ABNORMAL LOW (ref 6.0–8.3)

## 2014-03-20 LAB — CBC
HEMATOCRIT: 30.3 % — AB (ref 36.0–46.0)
Hemoglobin: 10.7 g/dL — ABNORMAL LOW (ref 12.0–15.0)
MCH: 34.3 pg — ABNORMAL HIGH (ref 26.0–34.0)
MCHC: 35.3 g/dL (ref 30.0–36.0)
MCV: 97.1 fL (ref 78.0–100.0)
Platelets: 105 10*3/uL — ABNORMAL LOW (ref 150–400)
RBC: 3.12 MIL/uL — ABNORMAL LOW (ref 3.87–5.11)
RDW: 13.7 % (ref 11.5–15.5)
WBC: 6.9 10*3/uL (ref 4.0–10.5)

## 2014-03-20 LAB — T4, FREE: Free T4: 1.43 ng/dL (ref 0.80–1.80)

## 2014-03-20 LAB — CORTISOL-AM, BLOOD: CORTISOL - AM: 0.7 ug/dL — AB (ref 4.3–22.4)

## 2014-03-20 LAB — T3, FREE: T3, Free: 1.7 pg/mL — ABNORMAL LOW (ref 2.3–4.2)

## 2014-03-20 MED ORDER — POLYETHYLENE GLYCOL 3350 17 G PO PACK
17.0000 g | PACK | Freq: Every day | ORAL | Status: DC
  Administered 2014-03-20 – 2014-03-21 (×2): 17 g via ORAL
  Filled 2014-03-20 (×2): qty 1

## 2014-03-20 MED ORDER — METHYLPHENIDATE HCL 5 MG PO TABS
5.0000 mg | ORAL_TABLET | Freq: Two times a day (BID) | ORAL | Status: DC
  Administered 2014-03-20 – 2014-03-21 (×3): 5 mg via ORAL
  Filled 2014-03-20 (×3): qty 1

## 2014-03-20 MED ORDER — MIRTAZAPINE 30 MG PO TABS
30.0000 mg | ORAL_TABLET | Freq: Every day | ORAL | Status: DC
  Administered 2014-03-20: 30 mg via ORAL
  Filled 2014-03-20 (×2): qty 1

## 2014-03-20 MED ORDER — BISACODYL 10 MG RE SUPP
10.0000 mg | Freq: Once | RECTAL | Status: AC
  Administered 2014-03-20: 10 mg via RECTAL
  Filled 2014-03-20: qty 1

## 2014-03-20 MED ORDER — DOCUSATE SODIUM 100 MG PO CAPS
200.0000 mg | ORAL_CAPSULE | Freq: Two times a day (BID) | ORAL | Status: DC
  Administered 2014-03-20 – 2014-03-21 (×3): 200 mg via ORAL
  Filled 2014-03-20 (×4): qty 2

## 2014-03-20 MED ORDER — TRAMADOL HCL 50 MG PO TABS: 100.0000 mg | ORAL_TABLET | Freq: Four times a day (QID) | ORAL | Status: DC | PRN

## 2014-03-20 MED ORDER — PROCHLORPERAZINE MALEATE 10 MG PO TABS: 5.0000 mg | ORAL_TABLET | Freq: Four times a day (QID) | ORAL | Status: DC | PRN

## 2014-03-20 NOTE — Evaluation (Signed)
Physical Therapy Evaluation Patient Details Name: Carrie Berry MRN: [id number] DOB: 07/09/1950 Today's Date: 03/20/2014   History of Present Illness  63 yo female admitted with metastatic cancer to brain, FTT. Hx breast cancer, anxiety, R mastectomy 02/2014, mets to liver, lung.   Clinical Impression  On eval, pt required Min assist for mobility-able to ambulate ~50 feet with RW. Discussed d/c plan with husband-plan is for home. Husband states he is trying to arrange 24 hour supervision. Pt will absolutely require 24 hour supervision for safety. If 24 hour care not available, may need to consider SNF.     Follow Up Recommendations Home health PT;Supervision/Assistance - 24 hour    Equipment Recommendations   (has dme per husband)    Recommendations for Other Services OT consult     Precautions / Restrictions Precautions Precautions: Fall Restrictions Weight Bearing Restrictions: No      Mobility  Bed Mobility Overal bed mobility: Needs Assistance Bed Mobility: Supine to Sit     Supine to sit: Supervision        Transfers Overall transfer level: Needs assistance   Transfers: Sit to/from Stand Sit to Stand: Supervision         General transfer comment: cues for UE placement.   Ambulation/Gait Ambulation/Gait assistance: Min assist Ambulation Distance (Feet): 50 Feet Assistive device: Rolling walker (2 wheeled) Gait Pattern/deviations: Step-through pattern;Decreased stride length     General Gait Details: Assist to stabilize. slow gait speed. pt fatiged fairly quickly and requested to return to room.   Stairs            Wheelchair Mobility    Modified Rankin (Stroke Patients Only)       Balance                                             Pertinent Vitals/Pain Pain Assessment: No/denies pain    Home Living Family/patient expects to be discharged to:: Private residence Living Arrangements: Spouse/significant  other Available Help at Discharge: Family Type of Home: House Home Access: Stairs to enter Entrance Stairs-Rails: Psychiatric nurse of Steps: [place]: One level Home Equipment: Environmental consultant - 2 wheels;Bedside commode      Prior Function Level of Independence: Needs assistance               Hand Dominance        Extremity/Trunk Assessment   Upper Extremity Assessment: Generalized weakness           Lower Extremity Assessment: Generalized weakness         Communication   Communication: No difficulties  Cognition Arousal/Alertness: Awake/alert Behavior During Therapy: WFL for tasks assessed/performed Overall Cognitive Status: Within Functional Limits for tasks assessed                      General Comments      Exercises        Assessment/Plan    PT Assessment Patient needs continued PT services  PT Diagnosis Difficulty walking;Generalized weakness   PT Problem List Decreased strength;Decreased activity tolerance;Decreased mobility;Decreased balance;Decreased knowledge of use of DME  PT Treatment Interventions DME instruction;Gait training;Functional mobility training;Therapeutic activities;Therapeutic exercise;Balance training   PT Goals (Current goals can be found in the Care Plan section) Acute Rehab PT Goals Patient Stated Goal: Home PT Goal Formulation: With patient/family Time For Goal Achievement: 04/03/14 Potential  to Achieve Goals: Fair    Frequency Min 3X/week   Barriers to discharge        Co-evaluation               End of Session Equipment Utilized During Treatment: Gait belt Activity Tolerance: Patient limited by fatigue Patient left: in chair;with call bell/phone within reach;with chair alarm set;with family/visitor present           Time: 1245-8099 PT Time Calculation (min): 15 min   Charges:   PT Evaluation $Initial PT Evaluation Tier I: 1 Procedure PT Treatments $Gait Training: 8-22  mins   PT G Codes:          Weston Anna, MPT Pager: [phone]

## 2014-03-20 NOTE — Plan of Care (Signed)
Problem: Phase I Progression Outcomes Goal: OOB as tolerated unless otherwise ordered Outcome: Completed/Met Date Met:  03/20/14 Goal: Initial discharge plan identified Outcome: Completed/Met Date Met:  03/20/14 Goal: Other Phase I Outcomes/Goals Outcome: Completed/Met Date Met:  03/20/14  Problem: Phase II Progression Outcomes Goal: Progress activity as tolerated unless otherwise ordered Outcome: Progressing Goal: Vital signs remain stable Outcome: Completed/Met Date Met:  03/20/14 Goal: Obtain order to discontinue catheter if appropriate Outcome: Not Applicable Date Met:  99/35/70

## 2014-03-20 NOTE — Progress Notes (Signed)
   03/20/14 1328  PT Time Calculation  PT Start Time 1136  PT Stop Time 1151  PT Time Calculation (min) 15 min  PT G-Codes **NOT FOR INPATIENT CLASS**  Functional Assessment Tool Used (clinical judgement)  Functional Limitation Mobility: Walking and moving around  Mobility: Walking and Moving Around Current Status (F2761) CI  Mobility: Walking and Moving Around Goal Status (Y7092) CI  PT General Charges  $$ ACUTE PT VISIT 1 Procedure  PT Evaluation  $Initial PT Evaluation Tier I 1 Procedure  PT Treatments  $Gait Training 8-22 mins  Weston Anna, MPT [phone]

## 2014-03-20 NOTE — Progress Notes (Addendum)
INITIAL NUTRITION ASSESSMENT  Pt meets criteria for severe MALNUTRITION in the context of chronic illess as evidenced by 12% weight loss in the past 3 weeks, intake <75% for > 1 month, obvious decrease of body fat and muscle mass.  DOCUMENTATION CODES Per approved criteria  -Severe malnutrition in the context of chronic illness   INTERVENTION: Continue Regular diet Carn[employer] Breakfast Essentials tid Offered support Continue Remeron RD to follow  NUTRITION DIAGNOSIS: Inadequate oral intake related to decreased appetite as evidenced by observation.   Goal: Intake of meals, supplements and snacks to meet >90% estimated needs.  Monitor:  Intake, labs, weight trend, plan of care.  Reason for Assessment: Consult for assessment of status and needs.  63 y.o. female  Admitting Dx: <principal problem not specified>  ASSESSMENT: Patient known from last admit.  Recently discharged to SNF on 10/27.  PMH significant for Breast cancer with metastasis to lung, liver, brain.  Now with FTT/Anorexia due to her malignancy and depression.  Patient remains on Remeron.  Patient ate approximately 50% lunch today.  Very sad at visit and unable to talk much. Appeared tearful. She did not wish to speak with Chaplain at this time.  Dislikes Boost and Ensure.  Drank [employer] in the past.  Weight has decreased from 122 lbs 02/25/14.  12% weight decrease in the past 3 weeks.  Obvious decrease of muscle mass and body fat.  Patient now meets criteria for severe malnutrition.  Height: Ht Readings from Last 1 Encounters:  03/19/14 5\' 3"  (1.6 m)    Weight: Wt Readings from Last 1 Encounters:  03/20/14 107 lb 12.8 oz (48.898 kg)    Ideal Body Weight: 115 lbs  % Ideal Body Weight: 93  Wt Readings from Last 10 Encounters:  03/20/14 107 lb 12.8 oz (48.898 kg)  03/13/14 108 lb (48.988 kg)  03/12/14 108 lb (48.988 kg)  02/25/14 122 lb 9.2 oz (55.6 kg)  02/17/14 122 lb 9.6 oz (55.611  kg)  02/13/14 122 lb 9.6 oz (55.611 kg)  01/26/14 122 lb 12.8 oz (55.702 kg)  01/06/14 122 lb 11.2 oz (55.656 kg)  12/08/13 121 lb 1.6 oz (54.931 kg)  11/17/13 119 lb 11.2 oz (54.296 kg)    Usual Body Weight: 122  % Usual Body Weight: 88  BMI:  Body mass index is 19.1 kg/(m^2).  Estimated Nutritional Needs: Kcal: 1600-1800 Protein: 75-85 gm Fluid: >1.6L daily  Skin: WDL  Diet Order: Diet regular  EDUCATION NEEDS: -Education not appropriate at this time   Intake/Output Summary (Last 24 hours) at 03/20/14 1246 Last data filed at 03/20/14 1043  Gross per 24 hour  Intake 1723.33 ml  Output   3900 ml  Net -2176.67 ml    Labs:   Recent Labs Lab 03/19/14 1049 03/20/14 0522  NA 134* 137  K 4.1 3.9  CL 96 103  CO2 28 27  BUN 20 12  CREATININE 0.63 0.48*  CALCIUM 8.5 8.1*  GLUCOSE 98 88    CBG (last 3)  No results for input(s): GLUCAP in the last 72 hours.  Scheduled Meds: . dexamethasone  4 mg Oral Q12H  . docusate sodium  200 mg Oral BID  . enoxaparin (LOVENOX) injection  40 mg Subcutaneous Q24H  . feeding supplement (ENSURE COMPLETE)  237 mL Oral BID BM  . fluticasone  1 spray Each Nare Daily  . levETIRAcetam  1,000 mg Oral BID  . methylphenidate  5 mg Oral BID WC  . mirtazapine  30 mg Oral QHS  . olopatadine  1 drop Both Eyes BID  . pantoprazole  40 mg Oral Daily  . polyethylene glycol  17 g Oral Daily  . traZODone  100 mg Oral QHS    Continuous Infusions: . sodium chloride 10 mL/hr at 03/20/14 2751    Past Medical History  Diagnosis Date  . Recurrent sinus infections   . Arthritis   . PONV (postoperative nausea and vomiting)   . GERD (gastroesophageal reflux disease)   . 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 Anxiety     no problems at present  . rt breast ca dx'd 12/2011    breast  . Breast cancer   . Metastasis from malignant tumor of breast 01/2013    to liver    Past Surgical History  Procedure Laterality  Date  . Tubal ligation    . Tubal ligation  1985  . Portacath placement N/A 01/10/2013    Procedure: ULTRASOUND GUIDED PORT-A-CATH INSERTION WITH FLUOROSCOPY;  Surgeon: [name], MD;  Location: [place];  Service: General;  Laterality: N/A;  . Esophagogastroduodenoscopy N/A 08/19/2013    Procedure: ESOPHAGOGASTRODUODENOSCOPY (EGD);  Surgeon: Garlan Fair, MD;  Location: [facility];  Service: Endoscopy;  Laterality: N/A;  . Total mastectomy Right 02/17/2014    Procedure: PALLIATIVE  RIGHT MASTECTOMY;  Surgeon: [name], MD;  Location: [place];  Service: General;  Laterality: Right;    Antonieta Iba, RD, LDN Clinical Inpatient Dietitian Pager:  [phone] Weekend and after hours pager:  [phone]

## 2014-03-20 NOTE — Care Management Note (Signed)
CARE MANAGEMENT NOTE 03/20/2014  Patient:  Carrie Berry, Carrie Berry   Account Number:  [id number]  Date Initiated:  03/20/2014  Documentation initiated by:  Marney Doctor  Subjective/Objective Assessment:   63 yo admitted with breast ca with mets to brain and dehydration.     Action/Plan:   Came from [facility] and will return home with husband with [facility] services   Anticipated DC Date:  03/21/2014   Anticipated DC Plan:  [place]  CM consult      Choice offered to / List presented to:          [facility] arranged  HH-1 RN  [place].   Status of service:  In process, will continue to follow Medicare Important Message given?   (If response is "NO", the following Medicare IM given date fields will be blank) Date Medicare IM given:   Medicare IM given by:   Date Additional Medicare IM given:   Additional Medicare IM given by:    Discharge Disposition:    Per UR Regulation:  Reviewed for med. necessity/level of care/duration of stay  If discussed at [place] of Stay Meetings, dates discussed:    Comments:  03/20/14 Marney Doctor RN,BSN,NCM PT is recommending HHPT and 24hr supervision.  Orders written for HHPT/OT/RN/aide.  [facility] in place for [facility] services and husband confirms that he received DME already.  Husband also confirms that they will have someone with the pt 24hrs/daily.  MD text paged to inform of all services in place.  No other CM needs noted at this time.

## 2014-03-20 NOTE — Progress Notes (Signed)
Emailed disability extension letter to [employer] @ [employer].

## 2014-03-20 NOTE — Evaluation (Signed)
Occupational Therapy Evaluation Patient Details Name: Carrie Berry MRN: [id number] DOB: 1950/12/07 Today's Date: 03/20/2014    History of Present Illness 63 yo female admitted with metastatic cancer to brain, FTT. Hx breast cancer, anxiety, R mastectomy 02/2014, mets to liver, lung.    Clinical Impression   Pt was admitted for FTT.  She has a h/o metastatic breast CA.  Pt will benefit from skilled OT to increase safety and independence with ADLs, focusing on the mobility portion of ADLs to decrease burden of care and increase activity tolerance.    Follow Up Recommendations  Home health OT;Supervision/Assistance - 24 hour    Equipment Recommendations  3 in 1 bedside comode    Recommendations for Other Services       Precautions / Restrictions Precautions Precautions: Fall Restrictions Weight Bearing Restrictions: No      Mobility Bed Mobility Overal bed mobility: Needs Assistance Bed Mobility: Supine to Sit     Supine to sit: Supervision extra time        Transfers Overall transfer level: Needs assistance   Transfers: Sit to/from Stand Sit to Stand: Mod assist;Min assist         General transfer comment: did not use walker, stood with therapist in front of her; cues to push up from bed    Balance   Sitting-balance support: Feet supported Sitting balance-Leahy Scale: Fair     Standing balance support: Bilateral upper extremity supported Standing balance-Leahy Scale: Poor                              ADL Overall ADL's : Needs assistance/impaired                                       General ADL Comments: Pt has decreased activity tolerance:  she is able to don socks with min guard and stand with mod A, but she is fatiqued and doesn't have the endurance for ADLs at this time:  min A for UB adls and mod A for LB adls would be needed based on clinical judgment.  Encouraged pt to participate in small bursts of activity as able.   Pt agreeable     Vision                     Perception     Praxis      Pertinent Vitals/Pain Pain Assessment: No/denies pain     Hand Dominance     Extremity/Trunk Assessment Upper Extremity Assessment Upper Extremity Assessment: Generalized weakness RUE Deficits / Details: pt with bil UE tremors:  able to open containers   Lower Extremity Assessment Lower Extremity Assessment: Generalized weakness       Communication Communication Communication: No difficulties   Cognition Arousal/Alertness: Awake/alert Behavior During Therapy: WFL for tasks assessed/performed: pt was seen by this therapist on last admission, and she is less animated than last time. Overall Cognitive Status: Within Functional Limits for tasks assessed                     General Comments       Exercises       Shoulder Instructions      Home Living Family/patient expects to be discharged to:: Private residence Living Arrangements: Spouse/significant other Available Help at Discharge: Family Type of Home: House Home Access: Stairs  to enter Entrance Stairs-Number of Steps: 5 Entrance Stairs-Rails: Right;Left Home Layout: One level     Bathroom Shower/Tub: Gaffer (built in seat)   Biochemist, clinical: [place]: Environmental consultant - 2 wheels;Bedside commode   Additional Comments: was at SNF prior to this admission.  Previously lived at home with spouse      Prior Functioning/Environment Level of Independence: Needs assistance             OT Diagnosis: Generalized weakness   OT Problem List: Decreased strength;Decreased activity tolerance;Impaired balance (sitting and/or standing);Decreased knowledge of use of DME or AE;Decreased safety awareness;Decreased coordination   OT Treatment/Interventions: Self-care/ADL training;DME and/or AE instruction;Energy conservation;Patient/family education;Balance training;Therapeutic activities;Therapeutic exercise     OT Goals(Current goals can be found in the care plan section) Acute Rehab OT Goals Patient Stated Goal: Home OT Goal Formulation: With patient Time For Goal Achievement: 04/03/14 Potential to Achieve Goals: Fair ADL Goals Pt Will Perform Grooming: with supervision;sitting (2 tasks, sitting unsupported) Pt Will Transfer to Toilet: with min assist;stand pivot transfer;bedside commode Pt Will Perform Toileting - Clothing Manipulation and hygiene: with min assist;sit to/from stand Additional ADL Goal #1: pt will stand for 2 minutes with min A for ADLs  OT Frequency: Min 2X/week   Barriers to D/C:            Co-evaluation              End of Session    Activity Tolerance: Patient limited by fatigue Patient left: in bed;with call bell/phone within reach;with family/visitor present   Time: 5176-1607 OT Time Calculation (min): 12 min Charges:  OT General Charges $OT Visit: 1 Procedure OT Evaluation $Initial OT Evaluation Tier I: 1 Procedure G-Codes: OT G-codes **NOT FOR INPATIENT CLASS** Functional Assessment Tool Used: clinical judgment Functional Limitation: Self care Self Care Current Status (P7106): At least 40 percent but less than 60 percent impaired, limited or restricted Self Care Goal Status (Y6948): At least 20 percent but less than 40 percent impaired, limited or restricted  Chermaine Schnyder 03/20/2014, 4:19 PM  Lesle Chris, OTR/L [phone] 03/20/2014

## 2014-03-20 NOTE — Progress Notes (Signed)
UR completed 

## 2014-03-20 NOTE — Plan of Care (Signed)
Problem: Phase I Progression Outcomes Goal: Pain controlled with appropriate interventions Outcome: Completed/Met Date Met:  03/20/14 Goal: OOB as tolerated unless otherwise ordered Outcome: Progressing Goal: Voiding-avoid urinary catheter unless indicated Outcome: Completed/Met Date Met:  03/20/14 Goal: Hemodynamically stable Outcome: Completed/Met Date Met:  03/20/14

## 2014-03-20 NOTE — Consult Note (Signed)
[place]  Telephone:([phone]) [phone] Fax:(336) [phone]     ID: Carrie Berry DOB: 1951/01/28  MR#: [id number]  SFK#:[id number]  Patient Care Team: [name], MD as PCP - General (Internal Medicine) [name], MD as Consulting Physician (General Surgery) Thea Silversmith, MD as Consulting Physician (Radiation Oncology) OTHER MD: [name] MD  CHIEF COMPLAINT: FTTh in setting of metastatic breast cancer  CURRENT TREATMENT: observation   INTERVAL HISTORY: Carrie Berry had her Right mastectomy 02/17/2014 for local control of her breast cancer. Shortly thereafter she was found to have multiple brain mets. She received whole brain irradiation completed 03/17/2014. After a brief admission she was discharged to [facility]. According to the patient she got very little help there, fell several times, and staff was not supportive. Her husband [name] brought her to the ED yesterday complaining of these and other deficiencies. She was admitted with FTTh and to revise discharge plans--either to home if enough support can be arranged for or to a better SNF.   REVIEW OF SYSTEMS: Carrie Berry tells me she had no pain--and specifically no h/a's, N/V, or visual changes. She feels weak. She is unsteady and needs someone there if she tries to ambulate or to move bed-to-BSC and back. She is spending most of the time in bed. No appetite. Food does not taste bad. No dysphagia or odynophagia. No BM x 4 days. No family in room.  PAST MEDICAL HISTORY: Past Medical History  Diagnosis Date  . Recurrent sinus infections   . Arthritis   . PONV (postoperative nausea and vomiting)   . GERD (gastroesophageal reflux disease)   . 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 Anxiety     no problems at present  . rt breast ca dx'd 12/2011    breast  . Breast cancer   . 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 Tubal ligation    . Tubal ligation  1985  . Portacath placement N/A 01/10/2013    Procedure: ULTRASOUND GUIDED PORT-A-CATH INSERTION WITH FLUOROSCOPY;  Surgeon: [name], MD;  Location: [place];  Service: General;  Laterality: N/A;  . Esophagogastroduodenoscopy N/A 08/19/2013    Procedure: ESOPHAGOGASTRODUODENOSCOPY (EGD);  Surgeon: Garlan Fair, MD;  Location: [facility];  Service: Endoscopy;  Laterality: N/A;  . Total mastectomy Right 02/17/2014    Procedure: PALLIATIVE  RIGHT MASTECTOMY;  Surgeon: [name], MD;  Location: [place];  Service: General;  Laterality: Right;    FAMILY HISTORY Family History  Problem Relation Age of Onset  . Bladder Cancer Father   . Colon cancer Maternal Grandmother      ADVANCED DIRECTIVES: remains full code   HEALTH MAINTENANCE: History  Substance Use Topics  . Smoking status: Never Smoker   . Smokeless tobacco: Never Used  . Alcohol Use: No     Allergies  Allergen Reactions  . Ondansetron Hcl Other (See Comments)    HEADACHE, patient states she still takes   . Codeine Nausea And Vomiting    "violently ill"  . Penicillins Rash    Childhood reaction -    Current Facility-Administered Medications  Medication Dose Route Frequency Provider Last Rate Last Dose  . 0.9 %  sodium chloride infusion   Intravenous Continuous Belkys A Regalado, MD 100 mL/hr at 03/20/14 0038    . acetaminophen (TYLENOL) tablet 650 mg  650 mg Oral Q8H  PRN Belkys A Regalado, MD      . antiseptic oral rinse (BIOTENE) solution 15 mL  15 mL Mouth Rinse PRN Belkys A Regalado, MD      . bisacodyl (DULCOLAX) suppository 10 mg  10 mg Rectal Daily PRN Belkys A Regalado, MD      . dexamethasone (DECADRON) tablet 4 mg  4 mg Oral Q12H Belkys A Regalado, MD   4 mg at 03/19/14 2144  . docusate sodium (COLACE) capsule 100 mg  100 mg Oral Daily Belkys A Regalado, MD      . enoxaparin (LOVENOX) injection 40 mg  40 mg Subcutaneous Q24H  Belkys A Regalado, MD   40 mg at 03/19/14 2143  . feeding supplement (ENSURE COMPLETE) (ENSURE COMPLETE) liquid 237 mL  237 mL Oral BID BM Belkys A Regalado, MD      . fluticasone (FLONASE) 50 MCG/ACT nasal spray 1 spray  1 spray Each Nare Daily Belkys A Regalado, MD      . levETIRAcetam (KEPPRA) tablet 1,000 mg  1,000 mg Oral BID Belkys A Regalado, MD   1,000 mg at 03/19/14 2143  . LORazepam (ATIVAN) tablet 0.5 mg  0.5 mg Oral Q6H PRN Belkys A Regalado, MD      . mirtazapine (REMERON) tablet 15 mg  15 mg Oral QHS Belkys A Regalado, MD   15 mg at 03/19/14 2144  . morphine 2 MG/ML injection 1 mg  1 mg Intravenous Q4H PRN Belkys A Regalado, MD      . olopatadine (PATANOL) 0.1 % ophthalmic solution 1 drop  1 drop Both Eyes BID Belkys A Regalado, MD   1 drop at 03/19/14 2144  . pantoprazole (PROTONIX) EC tablet 40 mg  40 mg Oral Daily Belkys A Regalado, MD      . polyethylene glycol (MIRALAX / GLYCOLAX) packet 17 g  17 g Oral BID Belkys A Regalado, MD   17 g at 03/19/14 2143  . prochlorperazine (COMPAZINE) tablet 5 mg  5 mg Oral TID AC & HS Belkys A Regalado, MD   5 mg at 03/19/14 2143  . traMADol (ULTRAM) tablet 100 mg  100 mg Oral 4 times per day Elmarie Shiley, MD   100 mg at 03/20/14 0518  . traZODone (DESYREL) tablet 100 mg  100 mg Oral QHS Belkys A Regalado, MD   100 mg at 03/19/14 2144    OBJECTIVE: middle aged White woman examined in bed  Filed Vitals:   03/20/14 0504  BP: 97/56  Pulse: 69  Temp: 97.9 F (36.6 C)  Resp: 14     Body mass index is 19.1 kg/(m^2).    ECOG FS:3 - Symptomatic, >50% confined to bed  Ocular: Sclerae unicteric,EOMs intact Ear-nose-throat: Oropharynx clear, no thrush Lymphatic: No cervical or supraclavicular adenopathy Lungs no rales or rhonchi, auscultated anterolaterally Heart regular rate and rhythm Abd soft, nontender, positive bowel sounds MSK: straight leg raising only 3 inches bilaterally Neuro: non-focal (good grip B; normal dorsiflexion B),  well-oriented, flat affect Breasts: R breast is s/p mastectomy; R axilla benign   LAB RESULTS:  CMP     Component Value Date/Time   NA 137 03/20/2014 0522   NA 141 02/02/2014 1550   K 3.9 03/20/2014 0522   K 4.0 02/02/2014 1550   CL 103 03/20/2014 0522   CO2 27 03/20/2014 0522   CO2 26 02/02/2014 1550   GLUCOSE 88 03/20/2014 0522   GLUCOSE 78 02/02/2014 1550   BUN 12 03/20/2014 0522  BUN 18.6 02/02/2014 1550   CREATININE 0.48* 03/20/2014 0522   CREATININE 0.9 02/02/2014 1550   CALCIUM 8.1* 03/20/2014 0522   CALCIUM 9.4 02/02/2014 1550   PROT 4.6* 03/20/2014 0522   PROT 7.1 02/02/2014 1550   ALBUMIN 2.3* 03/20/2014 0522   ALBUMIN 4.0 02/02/2014 1550   AST 23 03/20/2014 0522   AST 26 02/02/2014 1550   ALT 34 03/20/2014 0522   ALT 18 02/02/2014 1550   ALKPHOS 70 03/20/2014 0522   ALKPHOS 99 02/02/2014 1550   BILITOT 0.4 03/20/2014 0522   BILITOT 0.37 02/02/2014 1550   GFRNONAA >90 03/20/2014 0522   GFRAA >90 03/20/2014 0522    INo results found for: SPEP, UPEP  Lab Results  Component Value Date   WBC 6.9 03/20/2014   NEUTROABS 7.0 03/19/2014   HGB 10.7* 03/20/2014   HCT 30.3* 03/20/2014   MCV 97.1 03/20/2014   PLT 105* 03/20/2014    @LASTCHEMISTRY @  No results found for: LABCA2  No components found for: LABCA125  No results for input(s): INR in the last 168 hours.  Urinalysis    Component Value Date/Time   COLORURINE YELLOW 03/19/2014 1547   APPEARANCEUR CLOUDY* 03/19/2014 1547   LABSPEC 1.013 03/19/2014 1547   PHURINE 6.5 03/19/2014 1547   GLUCOSEU NEGATIVE 03/19/2014 1547   HGBUR NEGATIVE 03/19/2014 1547   BILIRUBINUR NEGATIVE 03/19/2014 1547   KETONESUR NEGATIVE 03/19/2014 1547   PROTEINUR NEGATIVE 03/19/2014 1547   UROBILINOGEN 1.0 03/19/2014 1547   NITRITE NEGATIVE 03/19/2014 1547   LEUKOCYTESUR SMALL* 03/19/2014 1547    STUDIES: Dg Chest 2 View  02/25/2014   CLINICAL DATA:  63 year old female with right mastectomy 1 week prior.   EXAM: CHEST - 2 VIEW  COMPARISON:  06/24/2013  FINDINGS: Cardiomediastinal silhouette unchanged in size and contour.  Unchanged position of right IJ port catheter with the tip terminating in the superior vena cava.  Surgical changes of right mastectomy with surgical clips overlying right hemi thorax.  As was mentioned on prior plain film, pulmonary nodules are difficult to resolve with plain film imaging. These are better characterized on prior CT. No confluent airspace disease, pneumothorax, or pleural effusion.  No displaced fracture.  IMPRESSION: No radiographic evidence of acute cardiopulmonary disease.  Unchanged position of right IJ port catheter.  Interval surgical changes of right mastectomy.  Signed,  Dulcy Fanny. Earleen Newport, DO  [facility]  [facility]   Electronically Signed   By: Corrie Mckusick D.O.   On: 02/25/2014 19:56   Ct Head Wo Contrast  02/25/2014   CLINICAL DATA:  63 year old female with right mastectomy 1 week prior.  EXAM: CT HEAD WITHOUT CONTRAST  TECHNIQUE: Contiguous axial images were obtained from the base of the skull through the vertex without intravenous contrast.  COMPARISON:  MRI 07/15/2013  FINDINGS: Unremarkable appearance of the calvarium with no displaced fracture or aggressive lesion.  Trace fluid within the right mastoid air cells. Left mastoid air cells are clear.  No significant paranasal sinus disease.  Unremarkable appearance of the bilateral orbits.  Geographic regions of hypodensity within the white matter the bilateral hemispheres. On the right this is predominantly temporoparietal, though it extends into the occipital lobe on the right and there is a focal region in the right frontal white matter. This edema contributes to mass effect and subtle midline shift measuring approximately 11 mm at the foramen of Monro.  There is a mixed density rounded lesion measuring 13 mm within the right parietal lobe (  image 17), as well as a  smaller hyperdensity in the right occipital lobe (image 16.)  To a lesser degree there is confluent hypodensity within the left posterior frontal lobe white matter as well as the left high parietal white matter.  Focal hyperdensity near the anterior limb of the right internal capsule (image 13).  Heterogeneous focus in the right cerebellar hemisphere (image 7).  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 The right-sided edema is worse, and contributes to 1 cm of right to left midline shift at the foramen of Monro. Further evaluation with contrast MRI is recommended.  These results were called by telephone at the time of interpretation on 02/25/2014 at 8:30 pm to Dr. Davonna Belling , who verbally acknowledged these results.  Signed,  Dulcy Fanny. Earleen Newport, DO  [facility]  [facility]   Electronically Signed   By: Corrie Mckusick D.O.   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 Status post palliative mastectomy 02/17/2014. Back pain on the left with nausea.  EXAM: CT CHEST, ABDOMEN, AND PELVIS WITH CONTRAST  TECHNIQUE: Multidetector CT imaging of the chest, abdomen and pelvis was performed following the standard protocol during bolus administration of intravenous contrast.  CONTRAST:  140mL OMNIPAQUE IOHEXOL 300 MG/ML  SOLN  COMPARISON:  Chest CT and abdomen scan 07/15/2013. PET CT scan 01/07/2013.  FINDINGS: CT CHEST FINDINGS  Since the prior chest CT, the patient has undergone right mastectomy. There is a small amount of gas the soft tissues from the procedure and surgical staples are in place. A small amount of simple appearing fluid is also identified.  A right axillary lymph node measuring 0.6 cm is seen. There is no fatty hilum within the node. The  node is seen on the prior study, but had a clearly visible fatty hilum. Tiny right paratracheal node seen on the prior examination is unchanged. No pathologically enlarged lymph nodes are seen in the hila or mediastinum. There is no pleural or pericardial effusion. Heart size is upper normal.  There has been progression of pulmonary metastases. A new left lower lobe pulmonary nodule on image 30 measures 0.8 x 0.6 cm. A new left upper lobe nodule on image 22 measures 0.4 cm. Right upper lobe pulmonary nodule which had measured 0.7 x 0.5 cm today measures 0.8 x 0.6 cm on image 20. Tiny subpleural nodule along the major fissure on the left seen on the prior study is unchanged on image 16. No focal bony abnormality is identified.  CT ABDOMEN AND PELVIS FINDINGS  Previously seen 0.9 cm low attenuating lesion the right hepatic lobe today measures 0.5 cm. Lesion in the left hepatic lobe which had measured 0.6 cm today measures 1.5 cm on image 58. Additional liver lesions do not appear markedly changed. No new liver lesion is identified.  The adrenal glands, spleen, gallbladder, biliary tree and pancreas appear normal. Mild prominence of the left renal pelvis seen on the prior study appear slightly increased. Abnormal axis orientation of the right kidney is noted. Small amount of free pelvic fluid is present. Urinary bladder, uterus adnexa are unremarkable. The stomach and small and large bowel appear normal. No lymphadenopathy is identified. No sclerotic lesion is seen.  IMPRESSION: Interval progression of pulmonary and hepatic metastases as detailed above.  Status  post right mastectomy without complicating feature.  Trace amount of free pelvic fluid of uncertain etiology.   Electronically Signed   By: Inge Rise M.D.   On: 02/28/2014 12:20   Mr Jeri Cos WU Contrast  02/26/2014   CLINICAL DATA:  Brain metastases.  Stage IV breast cancer.  EXAM: MRI HEAD WITHOUT AND WITH CONTRAST  TECHNIQUE: Multiplanar, multiecho  pulse sequences of the brain and surrounding structures were obtained without and with intravenous contrast.  CONTRAST:  29mL MULTIHANCE GADOBENATE DIMEGLUMINE 529 MG/ML IV SOLN  COMPARISON:  CT head without contrast 02/25/2014. MRI brain 07/15/2013.  FINDINGS: Multiple enhancing mass lesions are present bilaterally, more prominent right than left. Peripheral restricted diffusion is evident within multiple lesions.  A right cerebellar lesion measures 16 x 18 mm with significant surrounding edema.  A 5 mm left temporal lobe lesion is present on image 11. Bilateral occipital lobe lesions are present on image 12. The lesion on the left measures 6.5 x 8.0 mm lesion on the right measures 3 mm. The posterior right temporal lobe lesion on image 13 measures 15 x 12 mm. A posterior left frontal lobe lesion measures 18 x 21 x 18 mm. A right parietal lesion on image 15 measures 6 mm. Additional lesions in the right frontal and right parietal lobe are contained within an an area of confluent T2 hyperintensity in the right frontal and parietal lobe. A more anterior right frontal lobe lesion measures 6 mm on image 19. A high posterior left frontal lobe lesion measures 5.5 mm with surrounding edema. A more inferior left frontal lobe lesion measures 5.5 mm on image 21. And 6.5 mm lesion is present in the left parietal lobe. A peripheral smaller left parietal lesion is present on image 19. A punctate lesion is present within the left lentiform nucleus.  The vasogenic edema results in significant midline shift from the left to the right measuring 10 mm at the foramen of Monro. The basal cisterns are intact.  Flow is present in the major intracranial arteries. The globes and orbits are intact. The paranasal sinuses are clear. There is some fluid in the mastoid air cells bilaterally, left greater than right.  IMPRESSION: 1. Multiple enhancing mass lesions scattered throughout the brain. The largest concentration is in the posterior  right frontal and right parietal lobe. 2. The largest lesion is in the posterior right frontal lobe, measuring 18 x 21 x 18 mm. 3. A more lateral right occipital lesion measures 17 x 15 x 12 mm. 4. High posterior left frontal lobe measuring 5.5 mm. 5. 18 mm right cerebellar lesion. 6. Significant midline shift measures 5 mm at the foramen of Monro.   Electronically Signed   By: Lawrence Santiago M.D.   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 Status post palliative mastectomy 02/17/2014. Back pain on the left with nausea.  EXAM: CT CHEST, ABDOMEN, AND PELVIS WITH CONTRAST  TECHNIQUE: Multidetector CT imaging of the chest, abdomen and pelvis was performed following the standard protocol during bolus administration of intravenous contrast.  CONTRAST:  169mL OMNIPAQUE IOHEXOL 300 MG/ML  SOLN  COMPARISON:  Chest CT and abdomen scan 07/15/2013. PET CT scan 01/07/2013.  FINDINGS: CT CHEST FINDINGS  Since the prior chest CT, the patient has undergone right mastectomy. There is a small amount of gas the soft tissues from  the procedure and surgical staples are in place. A small amount of simple appearing fluid is also identified.  A right axillary lymph node measuring 0.6 cm is seen. There is no fatty hilum within the node. The node is seen on the prior study, but had a clearly visible fatty hilum. Tiny right paratracheal node seen on the prior examination is unchanged. No pathologically enlarged lymph nodes are seen in the hila or mediastinum. There is no pleural or pericardial effusion. Heart size is upper normal.  There has been progression of pulmonary metastases. A new left lower lobe pulmonary nodule on image 30 measures 0.8 x 0.6 cm. A new left upper lobe nodule on image 22 measures 0.4 cm. Right upper lobe pulmonary nodule which had measured 0.7 x 0.5 cm today measures 0.8 x  0.6 cm on image 20. Tiny subpleural nodule along the major fissure on the left seen on the prior study is unchanged on image 16. No focal bony abnormality is identified.  CT ABDOMEN AND PELVIS FINDINGS  Previously seen 0.9 cm low attenuating lesion the right hepatic lobe today measures 0.5 cm. Lesion in the left hepatic lobe which had measured 0.6 cm today measures 1.5 cm on image 58. Additional liver lesions do not appear markedly changed. No new liver lesion is identified.  The adrenal glands, spleen, gallbladder, biliary tree and pancreas appear normal. Mild prominence of the left renal pelvis seen on the prior study appear slightly increased. Abnormal axis orientation of the right kidney is noted. Small amount of free pelvic fluid is present. Urinary bladder, uterus adnexa are unremarkable. The stomach and small and large bowel appear normal. No lymphadenopathy is identified. No sclerotic lesion is seen.  IMPRESSION: Interval progression of pulmonary and hepatic metastases as detailed above.  Status post right mastectomy without complicating feature.  Trace amount of free pelvic fluid of uncertain etiology.   Electronically Signed   By: Inge Rise M.D.   On: 02/28/2014 12:20   Dg Abd Portable 1v  03/08/2014   CLINICAL DATA:  Constipation.  EXAM: PORTABLE ABDOMEN - 1 VIEW  COMPARISON:  CT abdomen and pelvis 02/28/2014.  FINDINGS: Oral contrast from patient's CT scan is in the descending colon to the rectum where a moderate stool burden is identified. The bowel gas pattern is nonobstructive.  IMPRESSION: No acute finding. Moderate stool burden descending colon to the rectum.   Electronically Signed   By: Inge Rise M.D.   On: 03/08/2014 18:30    ASSESSMENT: 63 y.o. Carrie Berry, [place] woman with a history of breast cancer metastatic at presentation August 2014 with lung and liver involvement, more recently with brain metastases, treated as summarized below, admitted 11/05//2015 from SNF with failure to  thrive   TREATMENT HISTORY  (0) status post right breast upper outer quadrant and right axillary lymph node biopsy 12/23/2012 for a clinical T3 N1 M1, stage IV invasive ductal carcinoma, grade 3, triple negative, with an MIB-1 of 86%.  (1) right liver lobe biopsy 01/21/2013 confirms metastatic adenocarcinoma; scans show involvement of the liver, lungs, and likely bone.  (2) enrolled in [facility] study [id number] (docetaxel + oral gamma secretase inibitor [id number]), received one cycle starting 02/04/2013 but withdrew because of poor tolerance despite treatment interruptions and decreased dosing of the oral component  (3) chest CT in early November 2014 was compared with studies in August 2014 and showed interval progression of metastatic breast cancer, with an enlarging primary right breast mass, enlarging right axillary lymph nodes, enlarging pulmonary  nodules and new/enlarging hepatic metastases.  (4) Abraxane started 03/19/2013, given day 1 and day 8 of each 21 day cycle, stopped with the day 1 cycle 3 dose (04/29/2013) because of local progression of disease  (5) cyclophosphamide and doxorubicin started 05/20/2013, given in dose dense fashion with Neulasta support on day 2. Completed 4 cycles 07/03/2013, with the final cycle dose reduced 15% because of an episode of febrile neutropenia after cycle 3. Restaging studies 07/15/2013 showed partial response.  (6) started capecitabine April 2015, 1.5 g by mouth twice a day, 7 days on 7 days off, last dose 02/08/2014 -- discontinued with evidence of disease progression  (7) s/p Right simple mastectomy 02/17/2014 to optimize local control, the pathology ([id number]) showing an invasive ductal carcinoma measuring 4.8 cm, grade 3, with close but negative margins and evidence of treatment response  (8) multiple brain metastases noted on brain MRI 02/26/2014--s/p whole brain irradiation completed 03/17/2014 (9) protein-calorie malnutrition (10) high fall  risk (11) depression  PLAN: As far as her metastatic cancer is concerned, Takita's brain metastases have been treated, and we will reassess with a brain MRI mid-December. Her lung and liver metastases are all very small and do not require immediate intervention. If her December brain MRI looks improved we will proceed with treatment of the peripheral disease at that time.  She needs 24/7 support. If that cannot be arranged for at home, possibly her husband might agree to discharge to [facility] or [facility]. Clearly the SNF she was discharged to was not able to provide the patient the support she needed.  She should continue on her current decadron dose until she returns to see me (I will schedule an appt with me NOV 20 at 2 PM at the cancer center). In the meantime she will benefit from an intensified bowel prophylaxis; institution of fall precautions; simplification of meds (I have d/c'd morphine and benzodiazepines and made antiemetics PRN); higher dose of remeron; and addition of methyphenidate. I have written those orders.  Please let me know if I can be of further help. Chauncey Cruel, MD   03/20/2014 7:56 AM

## 2014-03-20 NOTE — Telephone Encounter (Signed)
, °

## 2014-03-20 NOTE — Progress Notes (Signed)
[facility] PROGRESS NOTE  Carrie Berry:[id number] DOB: 1950-09-02 DOA: 03/19/2014  PCP: [name], MD  Brief HPI: Carrie Berry is a 63 y.o. 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 She was recently discharged to SNF on 10/27.    Past medical history:  Past Medical History  Diagnosis Date  . Recurrent sinus infections   . Arthritis   . PONV (postoperative nausea and vomiting)   . GERD (gastroesophageal reflux disease)   . 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 Anxiety     no problems at present  . rt breast ca dx'd 12/2011    breast  . Breast cancer   . Metastasis from malignant tumor of breast 01/2013    to liver    Consultants: Oncology  Procedures: None  Antibiotics: None  Subjective: Patient denies pain. Has poor appetite.  Objective: Vital Signs  Filed Vitals:   03/19/14 1547 03/19/14 1616 03/19/14 2048 03/20/14 0504  BP:  104/55 91/58 97/56   Pulse:  66 74 69  Temp:  97.6 F (36.4 C) 98.6 F (37 C) 97.9 F (36.6 C)  TempSrc:  Oral Oral Oral  Resp:  16 16 14   Height: 5\' 3"  (1.6 m) 5\' 3"  (1.6 m)    Weight: 48.988 kg (108 lb) 48.988 kg (108 lb)  48.898 kg (107 lb 12.8 oz)  SpO2:  98% 97% 99%    Intake/Output Summary (Last 24 hours) at 03/20/14 1132 Last data filed at 03/20/14 1043  Gross per 24 hour  Intake 1723.33 ml  Output   3900 ml  Net -2176.67 ml   Filed Weights   03/19/14 1547 03/19/14 1616 03/20/14 0504  Weight: 48.988 kg (108 lb) 48.988 kg (108 lb) 48.898 kg (107 lb 12.8 oz)    General appearance: alert, cooperative and flat affect. Head: Normocephalic, without obvious abnormality, atraumatic Resp: clear to auscultation bilaterally Cardio: regular rate and rhythm, S1, S2 normal, no murmur, click, rub or gallop GI: soft, non-tender;  bowel sounds normal; no masses,  no organomegaly Extremities: extremities normal, atraumatic, no cyanosis or edema Neurologic: no focal deficits  Lab Results:  Basic Metabolic Panel:  Recent Labs Lab 03/19/14 1049 03/20/14 0522  NA 134* 137  K 4.1 3.9  CL 96 103  CO2 28 27  GLUCOSE 98 88  BUN 20 12  CREATININE 0.63 0.48*  CALCIUM 8.5 8.1*   Liver Function Tests:  Recent Labs Lab 03/19/14 1049 03/20/14 0522  AST 24 23  ALT 41* 34  ALKPHOS 85 70  BILITOT 0.5 0.4  PROT 5.6* 4.6*  ALBUMIN 2.8* 2.3*   CBC:  Recent Labs Lab 03/19/14 1049 03/20/14 0522  WBC 8.6 6.9  NEUTROABS 7.0  --   HGB 12.6 10.7*  HCT 35.5* 30.3*  MCV 95.4 97.1  PLT 111* 105*    Studies/Results: No results found.  Medications:  Scheduled: . dexamethasone  4 mg Oral Q12H  . docusate sodium  200 mg Oral BID  . enoxaparin (LOVENOX) injection  40 mg Subcutaneous Q24H  . feeding supplement (ENSURE COMPLETE)  237 mL Oral BID BM  . fluticasone  1 spray Each Nare Daily  . levETIRAcetam  1,000 mg Oral BID  . methylphenidate  5 mg Oral BID WC  . mirtazapine  30 mg Oral QHS  . olopatadine  1 drop Both Eyes BID  .  pantoprazole  40 mg Oral Daily  . polyethylene glycol  17 g Oral Daily  . traZODone  100 mg Oral QHS   Continuous: . sodium chloride 10 mL/hr at 03/20/14 7741   OIN:OMVEHMCNOBSJG, antiseptic oral rinse, prochlorperazine, traMADol  Assessment/Plan:  Active Problems:   Constipation   Breast cancer metastasized to brain   FTT (failure to thrive) in adult   Weakness    FTT/Anorexia This is all secondary to her malignancy along with depression. Seen by Dr. [name] today and medications changes made. Encouraged to eat. TSh was low. FT4 is pending. Cortisol was low but patient is on Dexamethasone. No further evaluation at this time. On Remeron as well.   History Breast cancer with metastasis to Brain Continue with decadron, and Keppra. Oncology has seen.  Mild  Dehydration Improved with IVF.   Low TSH Free T3 is slightly low and T4 is normal. These levels likely reflect her other co-morbidities rather than tru hypothyroidism. Will recommend recheck in 4 weeks.  Constipation On bowel regimen.   Pyuria Urine culture is pending.   DVT Prophylaxis: Lovenox    Code Status: Full Code  Family Communication: Discussed with patient  Disposition Plan: PT/OT pending. Apparently patient wants to go home instead of SNF. Possible DC in AM.    LOS: 1 day   [facility] Pager [phone] 03/20/2014, 11:32 AM  If 8PM-8AM, please contact night-coverage at [email or web address], password [email or web address]

## 2014-03-20 NOTE — Progress Notes (Signed)
  Radiation Oncology         [phone] ________________________________  Name: Carrie Berry MRN: [id number]  Date: 03/17/2014  DOB: 1951-02-09  End of Treatment Note  SIMULATION AND TREATMENT PLANNING NOTE    ICD-9-CM ICD-10-CM  1. Brain metastases 198.3 C79.31    Diagnosis: 63 year old woman with at least 64 brain metastases from metastatic breast cancer.  Indication for treatment:  Palliation       Radiation treatment dates:   02/27/2014-03/17/2014  Site/dose:   The whole brain was treated to 35 Gy, in 14 fractions.  Beams/energy:   6 MV X-rays were delivered from the right and left using a thermoplastic custom mask.  Narrative: The patient tolerated radiation treatment relatively well.   No acute complications occurred.  Plan: The patient has completed radiation treatment. The patient will return to radiation oncology clinic for routine followup in one month. I advised them to call or return sooner if they have any questions or concerns related to their recovery or treatment. ________________________________  Sheral Apley. Tammi Klippel, M.D.

## 2014-03-21 LAB — URINE CULTURE: Colony Count: 50000

## 2014-03-21 LAB — BASIC METABOLIC PANEL
Anion gap: 8 (ref 5–15)
BUN: 13 mg/dL (ref 6–23)
CHLORIDE: 103 meq/L (ref 96–112)
CO2: 28 mEq/L (ref 19–32)
CREATININE: 0.49 mg/dL — AB (ref 0.50–1.10)
Calcium: 8.5 mg/dL (ref 8.4–10.5)
GFR calc Af Amer: >90 mL/min (ref >90)
GFR calc non Af Amer: >90 mL/min (ref >90)
Glucose, Bld: 105 mg/dL — ABNORMAL HIGH (ref 70–99)
Potassium: 3.9 mEq/L (ref 3.7–5.3)
Sodium: 139 mEq/L (ref 137–147)

## 2014-03-21 LAB — CBC
HEMATOCRIT: 33 % — AB (ref 36.0–46.0)
Hemoglobin: 11.4 g/dL — ABNORMAL LOW (ref 12.0–15.0)
MCH: 33.5 pg (ref 26.0–34.0)
MCHC: 34.5 g/dL (ref 30.0–36.0)
MCV: 97.1 fL (ref 78.0–100.0)
Platelets: 111 10*3/uL — ABNORMAL LOW (ref 150–400)
RBC: 3.4 MIL/uL — ABNORMAL LOW (ref 3.87–5.11)
RDW: 13.7 % (ref 11.5–15.5)
WBC: 7.7 10*3/uL (ref 4.0–10.5)

## 2014-03-21 MED ORDER — TRAMADOL HCL 50 MG PO TABS: 100.0000 mg | ORAL_TABLET | Freq: Four times a day (QID) | ORAL | Status: DC | PRN

## 2014-03-21 MED ORDER — ONDANSETRON HCL 4 MG PO TABS: 4.0000 mg | ORAL_TABLET | Freq: Three times a day (TID) | ORAL | Status: DC | PRN

## 2014-03-21 MED ORDER — PROCHLORPERAZINE MALEATE 5 MG PO TABS: 5.0000 mg | ORAL_TABLET | Freq: Three times a day (TID) | ORAL | Status: DC

## 2014-03-21 MED ORDER — POLYETHYLENE GLYCOL 3350 17 G PO PACK: 17.0000 g | PACK | Freq: Every day | ORAL | Status: DC

## 2014-03-21 MED ORDER — LEVETIRACETAM 1000 MG PO TABS: 1000.0000 mg | ORAL_TABLET | Freq: Two times a day (BID) | ORAL | Status: DC

## 2014-03-21 MED ORDER — OLOPATADINE HCL 0.2 % OP SOLN: 1.0000 [drp] | Freq: Every day | OPHTHALMIC | Status: DC | PRN

## 2014-03-21 MED ORDER — TOBRAMYCIN-DEXAMETHASONE 0.3-0.1 % OP SUSP: 1.0000 [drp] | Freq: Every day | OPHTHALMIC | Status: DC | PRN

## 2014-03-21 MED ORDER — HEPARIN SOD (PORK) LOCK FLUSH 100 UNIT/ML IV SOLN
500.0000 [IU] | INTRAVENOUS | Status: AC | PRN
  Administered 2014-03-21: 500 [IU]
  Filled 2014-03-21: qty 5

## 2014-03-21 MED ORDER — TRAZODONE HCL 50 MG PO TABS
100.0000 mg | ORAL_TABLET | Freq: Every day | ORAL | Status: DC
Start: 2014-03-21 — End: 2014-07-02

## 2014-03-21 MED ORDER — FLEET ENEMA 7-19 GM/118ML RE ENEM
1.0000 | ENEMA | Freq: Once | RECTAL | Status: AC
  Administered 2014-03-21: 1 via RECTAL
  Filled 2014-03-21: qty 1

## 2014-03-21 MED ORDER — DEXAMETHASONE 4 MG PO TABS: 4.0000 mg | ORAL_TABLET | Freq: Two times a day (BID) | ORAL | Status: DC

## 2014-03-21 MED ORDER — MIRTAZAPINE 30 MG PO TABS
30.0000 mg | ORAL_TABLET | Freq: Every day | ORAL | Status: DC
Start: 2014-03-21 — End: 2014-05-19

## 2014-03-21 MED ORDER — PANTOPRAZOLE SODIUM 40 MG PO TBEC: 40.0000 mg | DELAYED_RELEASE_TABLET | Freq: Every day | ORAL | Status: DC

## 2014-03-21 MED ORDER — METHYLPHENIDATE HCL 5 MG PO TABS: 5.0000 mg | ORAL_TABLET | Freq: Two times a day (BID) | ORAL | Status: DC

## 2014-03-21 NOTE — Discharge Instructions (Signed)
Failure to Thrive, Adult °Adult failure to thrive is a condition that some older people develop. People with this condition are able to do fewer and fewer activities over time. They may lose interest in being with friends or may not want to eat or drink. This is not a normal part of aging. Many things can cause this. Health problems, long-term disease, depression, bad eating habits, cognitive impairment, or disability may play a role in the development of this condition. Most of the time, it is important to treat whatever is causing failure to thrive. Sometimes, though, it might not be possible to treat the condition. The person could be nearing the end of life. Then, treatment could make the person suffer longer. °CAUSES °Sometimes, no specific cause can be found. Factors that have been linked to failure to thrive include: °· Diseases and medical conditions, such as: °¨ Cancer. °¨ Diabetes. °¨ Stomach and intestinal (gastrointestinal) problems. °¨ Lung disease. °¨ Liver disease. °¨ Kidney disease. °¨ Heart problems. °¨ Thyroid disease. °¨ Neurologic problems. °¨ Vitamin deficiencies. °· Disability. This may be the result of: °¨ A broken hip. °¨ A stroke. °¨ Very bad arthritis. °¨ Infections that last a long time. °¨ A long recovery from a surgery. °¨ Mental health issues. °¨ Medicines. °¨ Eating problems. °· Medicine for certain conditions. These conditions include: °¨ Parkinson's disease. °¨ Seizure disorder. °¨ Anxiety. °¨ Pain. °¨ High blood pressure. °¨ Depression. °¨ Infections. °SYMPTOMS °· Losing weight (more than 5% of total body weight). °· Getting more tired than usual after an activity. °· Having trouble getting up after sitting. °· Not being hungry or thirsty. °· Not getting out of bed. °· Not wanting to do usual activities. °· Being depressed. °· Getting infections often. °· Having bedsores. °· Taking a long time to recover after an injury or a surgery. °· Weakness. °DIAGNOSIS °A physical exam can help  a caregiver decide if someone has adult failure to thrive. This may include questions about the person's health presently and in the past. It also may include questions about behavior and mood, such as: °· Has activity changed? °· Does the person seem sad? °· Are eating habits different? °The caregiver may ask for a list of all medicines taken because certain medicines can lead to this condition. The list should include prescription and over-the-counter medicines. The caregiver will likely order some tests. These may include: °· Blood tests to check for infection, certain diseases, deficiencies, hormone levels, malnutrition, or dehydration. °· Urine tests to check for urinary tract infection or kidney failure. °· Imaging tests. Examples are an X-ray, a computed tomography (CT) scan, and magnetic resonance imaging (MRI). °· Hearing tests. °· Vision tests. °· Cognitive tests to check thinking ability. °· Activity tests to see if the person can do basic tasks like bathing and dressing. There also are tests to check if someone can shop, cook, or move around safely. °The caregiver will check if the person is eating enough healthy food. This may include: °· Checking weight. °· Having blood tested for cholesterol and protein levels. °· Seeing if anything else might be making it hard to eat (tooth problems, poorly fitted dentures, trouble swallowing). °The person may need to see a specialist to help with diagnosis or treatment. These specialists may include a speech therapist, physical therapist, occupational therapist, dietitian, or social worker.  °TREATMENT °Treatment for adult failure to thrive depends on the cause. Caregivers also must decide if a treatment has a good chance of working. It   often takes a team of caregivers to find the right treatment. Options may include: °· Treatments to cure a disease that can cause adult failure to thrive. °· Talk therapy or medicine to treat depression. °· A better diet. Eating more  often, adding nutritional supplements between meals, or taking vitamins may be suggested. Sometimes, medicine is prescribed to boost appetite. °· Medicine changes or stopping a medicine. °· Physical therapy. °· Moving to a place that offers more aid. °HOME CARE INSTRUCTIONS °What needs to be done at home varies from person to person. This will depend on what caused the condition and how it is treated. However, basic guidelines include: °· Taking any medicine prescribed by the caregiver. Following the directions carefully is important. °· Eating healthy foods. There should be enough calories in each meal. Ask the caregiver if vitamins or nutritional supplements should be taken between meals. Consider talking with a dietitian. °· Exercising. Strength training is important. A physical therapist can help set up an exercise program that fits the person. °· Making sure the person is safe at home. °· Talking with caregivers about what should be done if the person can no longer make decisions for himself or herself. °SEEK MEDICAL CARE IF: °· There are any questions about medicines. °· There are questions about the effects of treatment. °· The person is not able to eat well. °· The person is not able to move around. °· The person feels very sad or hopeless. °SEEK IMMEDIATE MEDICAL CARE IF:  °· The person has thoughts of ending his or her life. °· The person cannot eat or drink. °· The person does not get out of bed. °· Staying at home is no longer safe. °· The person has a fever. °Document Released: 07/24/2011 Document Reviewed: 07/24/2011 °[employer]® Patient Information ©2015 [employer]. This information is not intended to replace advice given to you by your health care provider. Make sure you discuss any questions you have with your health care provider. ° °

## 2014-03-21 NOTE — Progress Notes (Signed)
Notified [facility] of scheduled dc home today. Jonnie Finner RN CCM Case Mgmt phone [phone]

## 2014-03-21 NOTE — Progress Notes (Signed)
Received phone call from patient husband indicating faxed prescription for remeron was not received by the pharmacy.  Prescription called in over the telephone.  Spouse also indicates that ritalin requires prior authorization from the insurance company for approval.  RN case manager called and informed of this.

## 2014-03-21 NOTE — Progress Notes (Signed)
Utilization Review completed.  

## 2014-03-21 NOTE — Discharge Summary (Signed)
[facility]  Physician Discharge Summary   Patient ID: Carrie Berry MRN: [id number] DOB/AGE: 1951-05-11 63 y.o.  Admit date: 03/19/2014 Discharge date: 03/21/2014  PCP: [name], MD  DISCHARGE DIAGNOSES:  Active Problems:   Constipation   Breast cancer metastasized to brain   FTT (failure to thrive) in adult   Weakness   Protein-calorie malnutrition, severe   RECOMMENDATIONS FOR OUTPATIENT FOLLOW UP: Home health arranged.  Recheck Thyroid Function tests in 3-4 weeks.  DISCHARGE CONDITION: fair  Diet recommendation: Regular Diet  Filed Weights   03/19/14 1616 03/20/14 0504 03/21/14 0528  Weight: 48.988 kg (108 lb) 48.898 kg (107 lb 12.8 oz) 48.671 kg (107 lb 4.8 oz)    INITIAL HISTORY: Carrie Berry is a 63 y.o. 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 She was recently discharged to SNF on 10/27.   Consultations:  Dr. [name]  Procedures:  None  HOSPITAL COURSE:   FTT/Anorexia This is all secondary to her malignancy along with depression. She was seen by Dr. [name] and medications changes made. She was encouraged to eat. Cortisol was low but patient is on Dexamethasone. No further evaluation at this time. On Remeron as well the dose of which was increased. She was also initiated on Ritalin.   History Breast cancer with metastasis to Brain Continue with decadron and Keppra. Oncology has seen her and recommend continuing current dose of decadron for now.  Mild Dehydration Improved with IVF.   Low TSH Free T3 is slightly low and FT4 is normal. These levels likely reflect her other co-morbidities rather than true hypothyroidism. Will recommend recheck in 4 weeks.  Constipation She will be discharged on a bowel regimen.   Pyuria Urine culture was unremarkable. No need for  antibiotics.  Patient wishes to go home instead of SNF. Husband will provide support. Home health arranged. She was seen by PT and seems to have improved some compared to previous hospitalization. She will follow up with Dr. [name] on 11/20.   PERTINENT LABS:  The results of significant diagnostics from this hospitalization (including imaging, microbiology, ancillary and laboratory) are listed below for reference.    Microbiology: Recent Results (from the past 240 hour(s))  Urine culture     Status: None   Collection Time: 03/19/14  4:40 PM  Result Value Ref Range Status   Specimen Description URINE, CLEAN CATCH  Final   Special Requests NONE  Final   Culture  Setup Time   Final    03/20/2014 00:40 Performed at [facility]   Final    50,000 COLONIES/ML Performed at [facility]    Culture   Final    Multiple bacterial morphotypes present, none predominant. Suggest appropriate recollection if clinically indicated. Performed at [facility]    Report Status 03/21/2014 FINAL  Final     Labs: Basic Metabolic Panel:  Recent Labs Lab 03/19/14 1049 03/20/14 0522 03/21/14 0504  NA 134* 137 139  K 4.1 3.9 3.9  CL 96 103 103  CO2 28 27 28   GLUCOSE 98 88 105*  BUN 20 12 13   CREATININE 0.63 0.48* 0.49*  CALCIUM 8.5 8.1* 8.5   Liver Function Tests:  Recent Labs Lab 03/19/14 1049 03/20/14 0522  AST 24 23  ALT 41* 34  ALKPHOS 85 70  BILITOT 0.5 0.4  PROT  5.6* 4.6*  ALBUMIN 2.8* 2.3*   CBC:  Recent Labs Lab 03/19/14 1049 03/20/14 0522 03/21/14 0504  WBC 8.6 6.9 7.7  NEUTROABS 7.0  --   --   HGB 12.6 10.7* 11.4*  HCT 35.5* 30.3* 33.0*  MCV 95.4 97.1 97.1  PLT 111* 105* 111*    IMAGING STUDIES Dg Chest 2 View  02/25/2014   CLINICAL DATA:  63 year old female with right mastectomy 1 week prior.  EXAM: CHEST - 2 VIEW  COMPARISON:  06/24/2013  FINDINGS: Cardiomediastinal silhouette unchanged in size and contour.   Unchanged position of right IJ port catheter with the tip terminating in the superior vena cava.  Surgical changes of right mastectomy with surgical clips overlying right hemi thorax.  As was mentioned on prior plain film, pulmonary nodules are difficult to resolve with plain film imaging. These are better characterized on prior CT. No confluent airspace disease, pneumothorax, or pleural effusion.  No displaced fracture.  IMPRESSION: No radiographic evidence of acute cardiopulmonary disease.  Unchanged position of right IJ port catheter.  Interval surgical changes of right mastectomy.  Signed,  Dulcy Fanny. Earleen Newport, DO  [facility]  [facility]   Electronically Signed   By: Corrie Mckusick D.O.   On: 02/25/2014 19:56   Ct Head Wo Contrast  02/25/2014   CLINICAL DATA:  63 year old female with right mastectomy 1 week prior.  EXAM: CT HEAD WITHOUT CONTRAST  TECHNIQUE: Contiguous axial images were obtained from the base of the skull through the vertex without intravenous contrast.  COMPARISON:  MRI 07/15/2013  FINDINGS: Unremarkable appearance of the calvarium with no displaced fracture or aggressive lesion.  Trace fluid within the right mastoid air cells. Left mastoid air cells are clear.  No significant paranasal sinus disease.  Unremarkable appearance of the bilateral orbits.  Geographic regions of hypodensity within the white matter the bilateral hemispheres. On the right this is predominantly temporoparietal, though it extends into the occipital lobe on the right and there is a focal region in the right frontal white matter. This edema contributes to mass effect and subtle midline shift measuring approximately 11 mm at the foramen of Monro.  There is a mixed density rounded lesion measuring 13 mm within the right parietal lobe (image 17), as well as a smaller hyperdensity in the right occipital lobe (image 16.)  To a lesser degree there is confluent hypodensity within the  left posterior frontal lobe white matter as well as the left high parietal white matter.  Focal hyperdensity near the anterior limb of the right internal capsule (image 13).  Heterogeneous focus in the right cerebellar hemisphere (image 7).  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 The right-sided edema is worse, and contributes to 1 cm of right to left midline shift at the foramen of Monro. Further evaluation with contrast MRI is recommended.  These results were called by telephone at the time of interpretation on 02/25/2014 at 8:30 pm to Dr. Davonna Belling , who verbally acknowledged these results.  Signed,  Dulcy Fanny. Earleen Newport, DO  [facility]  [facility]   Electronically Signed   By: Corrie Mckusick D.O.   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 Status post palliative mastectomy 02/17/2014. Back pain on the left with nausea.  EXAM: CT CHEST, ABDOMEN, AND PELVIS WITH CONTRAST  TECHNIQUE: Multidetector CT imaging of the chest, abdomen and pelvis was performed following the standard protocol during bolus administration of intravenous contrast.  CONTRAST:  186mL OMNIPAQUE IOHEXOL 300 MG/ML  SOLN  COMPARISON:  Chest CT and abdomen scan 07/15/2013. PET CT scan 01/07/2013.  FINDINGS: CT CHEST FINDINGS  Since the prior chest CT, the patient has undergone right mastectomy. There is a small amount of gas the soft tissues from the procedure and surgical staples are in place. A small amount of simple appearing fluid is also identified.  A right axillary lymph node measuring 0.6 cm is seen. There is no fatty hilum within the node. The node is seen on the prior study, but had a clearly visible fatty hilum. Tiny right paratracheal node seen on the prior  examination is unchanged. No pathologically enlarged lymph nodes are seen in the hila or mediastinum. There is no pleural or pericardial effusion. Heart size is upper normal.  There has been progression of pulmonary metastases. A new left lower lobe pulmonary nodule on image 30 measures 0.8 x 0.6 cm. A new left upper lobe nodule on image 22 measures 0.4 cm. Right upper lobe pulmonary nodule which had measured 0.7 x 0.5 cm today measures 0.8 x 0.6 cm on image 20. Tiny subpleural nodule along the major fissure on the left seen on the prior study is unchanged on image 16. No focal bony abnormality is identified.  CT ABDOMEN AND PELVIS FINDINGS  Previously seen 0.9 cm low attenuating lesion the right hepatic lobe today measures 0.5 cm. Lesion in the left hepatic lobe which had measured 0.6 cm today measures 1.5 cm on image 58. Additional liver lesions do not appear markedly changed. No new liver lesion is identified.  The adrenal glands, spleen, gallbladder, biliary tree and pancreas appear normal. Mild prominence of the left renal pelvis seen on the prior study appear slightly increased. Abnormal axis orientation of the right kidney is noted. Small amount of free pelvic fluid is present. Urinary bladder, uterus adnexa are unremarkable. The stomach and small and large bowel appear normal. No lymphadenopathy is identified. No sclerotic lesion is seen.  IMPRESSION: Interval progression of pulmonary and hepatic metastases as detailed above.  Status post right mastectomy without complicating feature.  Trace amount of free pelvic fluid of uncertain etiology.   Electronically Signed   By: Inge Rise M.D.   On: 02/28/2014 12:20   Mr Jeri Cos DZ Contrast  02/26/2014   CLINICAL DATA:  Brain metastases.  Stage IV breast cancer.  EXAM: MRI HEAD WITHOUT AND WITH CONTRAST  TECHNIQUE: Multiplanar, multiecho pulse sequences of the brain and surrounding structures were obtained without and with intravenous contrast.  CONTRAST:   12mL MULTIHANCE GADOBENATE DIMEGLUMINE 529 MG/ML IV SOLN  COMPARISON:  CT head without contrast 02/25/2014. MRI brain 07/15/2013.  FINDINGS: Multiple enhancing mass lesions are present bilaterally, more prominent right than left. Peripheral restricted diffusion is evident within multiple lesions.  A right cerebellar lesion measures 16 x 18 mm with significant surrounding edema.  A 5 mm left temporal lobe lesion is present on image 11. Bilateral occipital lobe lesions are present on image 12. The lesion on the left measures 6.5 x 8.0 mm lesion on the right measures 3 mm. The posterior right temporal lobe lesion on image 13 measures 15 x 12 mm. A posterior left frontal lobe lesion measures 18 x 21 x 18 mm. A right parietal lesion on image 15  measures 6 mm. Additional lesions in the right frontal and right parietal lobe are contained within an an area of confluent T2 hyperintensity in the right frontal and parietal lobe. A more anterior right frontal lobe lesion measures 6 mm on image 19. A high posterior left frontal lobe lesion measures 5.5 mm with surrounding edema. A more inferior left frontal lobe lesion measures 5.5 mm on image 21. And 6.5 mm lesion is present in the left parietal lobe. A peripheral smaller left parietal lesion is present on image 19. A punctate lesion is present within the left lentiform nucleus.  The vasogenic edema results in significant midline shift from the left to the right measuring 10 mm at the foramen of Monro. The basal cisterns are intact.  Flow is present in the major intracranial arteries. The globes and orbits are intact. The paranasal sinuses are clear. There is some fluid in the mastoid air cells bilaterally, left greater than right.  IMPRESSION: 1. Multiple enhancing mass lesions scattered throughout the brain. The largest concentration is in the posterior right frontal and right parietal lobe. 2. The largest lesion is in the posterior right frontal lobe, measuring 18 x 21 x 18  mm. 3. A more lateral right occipital lesion measures 17 x 15 x 12 mm. 4. High posterior left frontal lobe measuring 5.5 mm. 5. 18 mm right cerebellar lesion. 6. Significant midline shift measures 5 mm at the foramen of Monro.   Electronically Signed   By: Lawrence Santiago M.D.   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 Status post palliative mastectomy 02/17/2014. Back pain on the left with nausea.  EXAM: CT CHEST, ABDOMEN, AND PELVIS WITH CONTRAST  TECHNIQUE: Multidetector CT imaging of the chest, abdomen and pelvis was performed following the standard protocol during bolus administration of intravenous contrast.  CONTRAST:  150mL OMNIPAQUE IOHEXOL 300 MG/ML  SOLN  COMPARISON:  Chest CT and abdomen scan 07/15/2013. PET CT scan 01/07/2013.  FINDINGS: CT CHEST FINDINGS  Since the prior chest CT, the patient has undergone right mastectomy. There is a small amount of gas the soft tissues from the procedure and surgical staples are in place. A small amount of simple appearing fluid is also identified.  A right axillary lymph node measuring 0.6 cm is seen. There is no fatty hilum within the node. The node is seen on the prior study, but had a clearly visible fatty hilum. Tiny right paratracheal node seen on the prior examination is unchanged. No pathologically enlarged lymph nodes are seen in the hila or mediastinum. There is no pleural or pericardial effusion. Heart size is upper normal.  There has been progression of pulmonary metastases. A new left lower lobe pulmonary nodule on image 30 measures 0.8 x 0.6 cm. A new left upper lobe nodule on image 22 measures 0.4 cm. Right upper lobe pulmonary nodule which had measured 0.7 x 0.5 cm today measures 0.8 x 0.6 cm on image 20. Tiny subpleural nodule along the major fissure on the left seen on the prior study is unchanged on image  16. No focal bony abnormality is identified.  CT ABDOMEN AND PELVIS FINDINGS  Previously seen 0.9 cm low attenuating lesion the right hepatic lobe today measures 0.5 cm. Lesion in the left hepatic lobe which had measured 0.6 cm today measures 1.5 cm on image 58. Additional liver  lesions do not appear markedly changed. No new liver lesion is identified.  The adrenal glands, spleen, gallbladder, biliary tree and pancreas appear normal. Mild prominence of the left renal pelvis seen on the prior study appear slightly increased. Abnormal axis orientation of the right kidney is noted. Small amount of free pelvic fluid is present. Urinary bladder, uterus adnexa are unremarkable. The stomach and small and large bowel appear normal. No lymphadenopathy is identified. No sclerotic lesion is seen.  IMPRESSION: Interval progression of pulmonary and hepatic metastases as detailed above.  Status post right mastectomy without complicating feature.  Trace amount of free pelvic fluid of uncertain etiology.   Electronically Signed   By: Inge Rise M.D.   On: 02/28/2014 12:20   Dg Abd Portable 1v  03/08/2014   CLINICAL DATA:  Constipation.  EXAM: PORTABLE ABDOMEN - 1 VIEW  COMPARISON:  CT abdomen and pelvis 02/28/2014.  FINDINGS: Oral contrast from patient's CT scan is in the descending colon to the rectum where a moderate stool burden is identified. The bowel gas pattern is nonobstructive.  IMPRESSION: No acute finding. Moderate stool burden descending colon to the rectum.   Electronically Signed   By: Inge Rise M.D.   On: 03/08/2014 18:30    DISCHARGE EXAMINATION: Filed Vitals:   03/20/14 0504 03/20/14 1503 03/20/14 2132 03/21/14 0528  BP: 97/56 94/57 101/54 93/57  Pulse: 69 76 75 74  Temp: 97.9 F (36.6 C) 98.4 F (36.9 C) 98.2 F (36.8 C) 98.6 F (37 C)  TempSrc: Oral Oral Oral Oral  Resp: 14 15 14 14   Height:      Weight: 48.898 kg (107 lb 12.8 oz)   48.671 kg (107 lb 4.8 oz)  SpO2: 99% 97% 97%  97%   General appearance: alert, cooperative and flat affect Resp: clear to auscultation bilaterally Cardio: regular rate and rhythm, S1, S2 normal, no murmur, click, rub or gallop GI: soft, non-tender; bowel sounds normal; no masses,  no organomegaly  DISPOSITION: Home with home health  Discharge Instructions    Call MD for:  difficulty breathing, headache or visual disturbances    Complete by:  As directed      Call MD for:  extreme fatigue    Complete by:  As directed      Call MD for:  persistant dizziness or light-headedness    Complete by:  As directed      Call MD for:  persistant nausea and vomiting    Complete by:  As directed      Call MD for:  severe uncontrolled pain    Complete by:  As directed      Call MD for:  temperature >100.4    Complete by:  As directed      Diet general    Complete by:  As directed      Discharge instructions    Complete by:  As directed   Be sure to follow up with Dr. [name].     Increase activity slowly    Complete by:  As directed            ALLERGIES:  Allergies  Allergen Reactions  . Ondansetron Hcl Other (See Comments)    HEADACHE, patient states she still takes   . Codeine Nausea And Vomiting    "violently ill"  . Penicillins Rash    Childhood reaction -    Current Discharge Medication List    START taking these medications   Details  methylphenidate (RITALIN)  5 MG tablet Take 1 tablet (5 mg total) by mouth 2 (two) times daily with breakfast and lunch. Qty: 60 tablet, Refills: 1   Associated Diagnoses: Constipation, unspecified constipation type; Breast cancer metastasized to brain, right; FTT (failure to thrive) in adult      CONTINUE these medications which have CHANGED   Details  dexamethasone (DECADRON) 4 MG tablet Take 1 tablet (4 mg total) by mouth every 12 (twelve) hours. Qty: 60 tablet, Refills: 0    levETIRAcetam (KEPPRA) 1000 MG tablet Take 1 tablet (1,000 mg total) by mouth 2 (two) times daily. Qty:  60 tablet, Refills: 2   Associated Diagnoses: Focal motor seizure    mirtazapine (REMERON) 30 MG tablet Take 1 tablet (30 mg total) by mouth at bedtime. Qty: 30 tablet, Refills: 1    Olopatadine HCl (PATADAY) 0.2 % SOLN Place 1 drop into both eyes daily as needed (dry eyes). Qty: 1 Bottle, Refills: 0    ondansetron (ZOFRAN) 4 MG tablet Take 1 tablet (4 mg total) by mouth every 8 (eight) hours as needed for nausea or vomiting. Qty: 20 tablet, Refills: 0    pantoprazole (PROTONIX) 40 MG tablet Take 1 tablet (40 mg total) by mouth daily. Qty: 30 tablet, Refills: 0    polyethylene glycol (MIRALAX / GLYCOLAX) packet Take 17 g by mouth daily. Qty: 30 each, Refills: 2   Associated Diagnoses: Brain metastases; Metastasis to brain; Breast cancer of upper-outer quadrant of right female breast; Gastroesophageal reflux disease with esophagitis    prochlorperazine (COMPAZINE) 5 MG tablet Take 1 tablet (5 mg total) by mouth 4 (four) times daily -  before meals and at bedtime. Qty: 120 tablet, Refills: 0   Associated Diagnoses: Brain metastases; Gastroesophageal reflux disease, esophagitis presence not specified; Metastasis to brain; Breast cancer of upper-outer quadrant of right female breast    tobramycin-dexamethasone (TOBRADEX) ophthalmic solution Place 1 drop into both eyes daily as needed. For dry eyes per patient Qty: 5 mL, Refills: 0    traMADol (ULTRAM) 50 MG tablet Take 2 tablets (100 mg total) by mouth every 6 (six) hours as needed. Qty: 60 tablet, Refills: 0   Associated Diagnoses: Brain metastases; Metastasis to brain; Breast cancer of upper-outer quadrant of right female breast; Gastroesophageal reflux disease with esophagitis    traZODone (DESYREL) 50 MG tablet Take 2 tablets (100 mg total) by mouth at bedtime. Qty: 60 tablet, Refills: 0      CONTINUE these medications which have NOT CHANGED   Details  acetaminophen (TYLENOL) 325 MG tablet Take 650 mg by mouth every 8 (eight)  hours as needed for pain.    docusate sodium 100 MG CAPS Take 100 mg by mouth daily. Qty: 10 capsule, Refills: 0   Associated Diagnoses: Brain metastases; Metastasis to brain; Breast cancer of upper-outer quadrant of right female breast; Gastroesophageal reflux disease with esophagitis    triamcinolone (NASACORT) 55 MCG/ACT nasal inhaler Place 2 sprays into the nose daily.      STOP taking these medications     LORazepam (ATIVAN) 0.5 MG tablet        Follow-up Information    Follow up with Chauncey Cruel, MD.   Specialty:  Oncology   Why:  on Nov 20 at [facility]. Please call his office to confirm date and time.   Contact information:   [place] [address] [phone]       Follow up with [name], MD. Schedule an appointment as soon as possible for  a visit in 1 week.   Specialty:  Internal Medicine   Why:  post hospitalization follow up   Contact information:   [address] [place] [address] [phone]       TOTAL DISCHARGE TIME: 35 mins  [facility] Pager [phone]  03/21/2014, 7:56 AM

## 2014-04-01 ENCOUNTER — Encounter (INDEPENDENT_AMBULATORY_CARE_PROVIDER_SITE_OTHER): Payer: Self-pay | Admitting: General Surgery

## 2014-04-01 NOTE — Progress Notes (Signed)
Patient ID: Carrie Berry, female   DOB: 28-Dec-1950, 63 y.o.   MRN: [id number]  ay Carrie Berry 04/01/2014 11:37 AM Location: [facility] Patient #: [id number] DOB: [birth year] Married / Language: Carrie Berry / Race: White Female History of Present Illness Carrie Berry; 04/01/2014 12:31 PM) Patient words: long term f/u.  The patient is a 63 year old female    Note:She is here for another postoperative visit after paly attentive right mastectomy February 17, 2014. She was recently hospitalized for treatment for brain metastasis. Staples were removed at that time. Very recently, they have noticed the chest wall nodule just superior to the incision. It is not painful. Of note was that there is significant skin involvement by the tumor.  Vitals Briant Cedar CMA; 04/01/2014 11:39 AM) 04/01/2014 11:38 AM Weight: 99.38 lb Height: 63in Body Surface Area: 1.42 m Body Mass Index: 17.6 kg/m Temp.: 53F  Pulse: 113 (Regular)  BP: 120/60 (Sitting, Left Arm, Standard)     Physical Exam Carrie Berry; 04/01/2014 12:33 PM)  The physical exam findings are as follows: Note:General-cachetic female in wheelchair, her husband is with her  Right chest-transverse incision is clean, 1 cm mobile subcutaneous nodule superior to the lateral aspect of the incision    Assessment & Plan Carrie Berry; 04/01/2014 12:34 PM)  MALIGNANT NEOPLASM OF LOWER-OUTER QUADRANT OF RIGHT FEMALE BREAST (174.5  C50.511)  METASTATIC BREAST CANCER (174.9  C50.919) Impression: Status post prophylactic mastectomy. Has metastatic disease and recently has been discharged from the hospital following radiation therapy for brain metastasis. Has a new nodule just superior to the incision which is suspicious for early recurrence versus reactive lymph node or cyst. We discussed removing this today but she stated she would like a break from everything for a little while. I think  this is reasonable in her condition.  Plan: Return visit one month. If the nodule starts to substantially enlarge before that time I have asked him to call me. We can schedule to have this removed in the office if need be.  Carrie Berry

## 2014-04-03 ENCOUNTER — Ambulatory Visit (HOSPITAL_BASED_OUTPATIENT_CLINIC_OR_DEPARTMENT_OTHER): Payer: BC Managed Care – PPO | Admitting: Oncology

## 2014-04-03 ENCOUNTER — Other Ambulatory Visit (HOSPITAL_BASED_OUTPATIENT_CLINIC_OR_DEPARTMENT_OTHER): Payer: BC Managed Care – PPO

## 2014-04-03 VITALS — BP 114/64 | HR 92 | Temp 98.9°F | Resp 18 | Ht 63.0 in | Wt 103.4 lb

## 2014-04-03 DIAGNOSIS — C50919 Malignant neoplasm of unspecified site of unspecified female breast: Secondary | ICD-10-CM

## 2014-04-03 DIAGNOSIS — C7931 Secondary malignant neoplasm of brain: Secondary | ICD-10-CM

## 2014-04-03 DIAGNOSIS — C50411 Malignant neoplasm of upper-outer quadrant of right female breast: Secondary | ICD-10-CM

## 2014-04-03 DIAGNOSIS — E46 Unspecified protein-calorie malnutrition: Secondary | ICD-10-CM

## 2014-04-03 DIAGNOSIS — C7989 Secondary malignant neoplasm of other specified sites: Secondary | ICD-10-CM

## 2014-04-03 DIAGNOSIS — C50911 Malignant neoplasm of unspecified site of right female breast: Secondary | ICD-10-CM

## 2014-04-03 LAB — CBC WITH DIFFERENTIAL/PLATELET
BASO%: 0.1 % (ref 0.0–2.0)
Basophils Absolute: 0 10*3/uL (ref 0.0–0.1)
EOS%: 0 % (ref 0.0–7.0)
Eosinophils Absolute: 0 10*3/uL (ref 0.0–0.5)
HEMATOCRIT: 36.9 % (ref 34.8–46.6)
HEMOGLOBIN: 12.8 g/dL (ref 11.6–15.9)
LYMPH%: 9.2 % — AB (ref 14.0–49.7)
MCH: 33.4 pg (ref 25.1–34.0)
MCHC: 34.7 g/dL (ref 31.5–36.0)
MCV: 96.3 fL (ref 79.5–101.0)
MONO#: 0.9 10*3/uL (ref 0.1–0.9)
MONO%: 8.3 % (ref 0.0–14.0)
NEUT#: 8.4 10*3/uL — ABNORMAL HIGH (ref 1.5–6.5)
NEUT%: 82.4 % — AB (ref 38.4–76.8)
PLATELETS: 174 10*3/uL (ref 145–400)
RBC: 3.83 10*6/uL (ref 3.70–5.45)
RDW: 14.1 % (ref 11.2–14.5)
WBC: 10.2 10*3/uL (ref 3.9–10.3)
lymph#: 0.9 10*3/uL (ref 0.9–3.3)

## 2014-04-03 LAB — COMPREHENSIVE METABOLIC PANEL (CC13)
ALT: 46 U/L (ref 0–55)
AST: 25 U/L (ref 5–34)
Albumin: 3 g/dL — ABNORMAL LOW (ref 3.5–5.0)
Alkaline Phosphatase: 97 U/L (ref 40–150)
Anion Gap: 9 mEq/L (ref 3–11)
BILIRUBIN TOTAL: 0.45 mg/dL (ref 0.20–1.20)
BUN: 18.7 mg/dL (ref 7.0–26.0)
CHLORIDE: 101 meq/L (ref 98–109)
CO2: 24 mEq/L (ref 22–29)
CREATININE: 0.6 mg/dL (ref 0.6–1.1)
Calcium: 8.7 mg/dL (ref 8.4–10.4)
Glucose: 92 mg/dl (ref 70–140)
Potassium: 4.9 mEq/L (ref 3.5–5.1)
Sodium: 134 mEq/L — ABNORMAL LOW (ref 136–145)
Total Protein: 5.6 g/dL — ABNORMAL LOW (ref 6.4–8.3)

## 2014-04-03 LAB — TECHNOLOGIST REVIEW: Technologist Review: 2

## 2014-04-03 NOTE — Progress Notes (Signed)
. Patient ID: Carrie Berry, female   DOB: [birth year], 63 y.o.   MRN: [id number] ID: Carrie Berry OB: 1951/04/07  MR#: [id number]  CSN#:[id number]  PCP: Carrie Hams, MD GYN:   SU: Carrie Berry OTHER MD: Carrie Berry, Carrie Berry, Carrie Berry, Carrie Berry  CHIEF COMPLAINT:  Metastatic Breast Cancer CURRENT TREATMENT: observation  BREAST CANCER HISTORY: From the originally intake note:  Carrie Berry noted a mass in her right breast December of 2013, but did not think much of it. It did grow some lower the summer, but she was keeping her grandchild at that time and was too busy so she did not bring it to Carrie Berry attention until August. He set her up for bilateral mammography and ultrasonography at [facility] 12/18/2012 and this measured, on the right, a large irregular lobulated mass in the upper outer quadrant which by ultrasound measured 5.7 cm. The right axilla showed some lymph nodes with thickened cortices. In the left breast there was a focal asymmetry at the depth, lateral to the nipple, and by ultrasound there was a 9 mm ill-defined hypoechoic lesion in this location. Biopsy of the left breast lesion showed only fibrocystic changes.  Biopsy of the right breast mass and right axillary adenopathy ([id number]) on 12/23/2012 showed both to be involved by invasive ductal carcinoma, grade 2, triple negative, with an MIB-1 of 86%.  MRI of the breast 12/27/2012 at [facility] imaging showed in the right breast a mass abutting the pectoralis muscle without enhancement of the muscle measuring 4.8 cm. The satellite nodule measuring approximately 3 mm was also noted superior to the mass and several abnormal and enlarged right axillary lymph nodes were noted, one of which appeared to be necrotic. The largest node measured 2.0 cm. Unfortunately, numerous bilateral pulmonary nodules were also noted.  The patient's subsequent history is as detailed below   INTERVAL HISTORY: Carrie Berry returns today for  follow up of her metastatic breast cancer accompanied by her husband Carrie Berry. To update her recent history: She underwent a right mastectomy to secure local control of her cancer. Shortly thereafter she was noted to have symptoms of CNS involvement, confirmed by MRI of the brain 02/26/2014. This showed multiple enhancing masses throughout the brain the largest measuring 2.1 cm. There was significant midline shift. She was admitted, started on steroids, and proceeded to whole brain irradiation completed 03/17/2014. Her capecitabine was discontinued. She was discharged to a skilled nursing facility which according to the husband was "horrible place". She is now back home under his care and is here today to discuss future plans  REVIEW OF SYSTEMS Carrie Berry is very weak. At home she walks with a "walker chair". She has fallen twice, did not injure herself in either case. Basically her legs "give way". She has no pain. She denies headaches or visual changes. She denies nausea or vomiting although her husband tells me she does feel nauseated and is taking Compazine on a regular basis. She eats a large breakfast, then has supplements mid morning, then eats a fairly good lunch, then more supplements, dense upper. She is usually embedded by 10 and sleeps through the night. She states out of bed during the day. Mostly she sits in a recliner and watches TV. She visits with friends when they come by. She has normal bowel movements and no problems with bladder control. She denies rash or pressure sores. She denies any bleeding. She is not confused but sometimes has word finding difficulties. She denies focal weakness. A  detailed review of systems today was otherwise stable.  PAST MEDICAL HISTORY: Past Medical History  Diagnosis Date  . Recurrent sinus infections   . Arthritis   . PONV (postoperative nausea and vomiting)   . GERD (gastroesophageal reflux disease)   . Antineoplastic chemotherapy induced pancytopenia 06/25/2013   . Anxiety     no problems at present  . rt breast ca dx'd 12/2011    breast  . Breast cancer   . 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 Tubal ligation    . Tubal ligation  1985  . Portacath placement N/A 01/10/2013    Procedure: ULTRASOUND GUIDED PORT-A-CATH INSERTION WITH FLUOROSCOPY;  Surgeon: Carrie Hollingshead, MD;  Location: [place];  Service: General;  Laterality: N/A;  . Esophagogastroduodenoscopy N/A 08/19/2013    Procedure: ESOPHAGOGASTRODUODENOSCOPY (EGD);  Surgeon: Carrie Fair, MD;  Location: [facility];  Service: Endoscopy;  Laterality: N/A;  . Total mastectomy Right 02/17/2014    Procedure: PALLIATIVE  RIGHT MASTECTOMY;  Surgeon: Carrie Confer, MD;  Location: [place];  Service: General;  Laterality: Right;    FAMILY HISTORY Family History  Problem Relation Age of Onset  . Bladder Cancer Father   . Colon cancer Maternal Grandmother    the patient's parents are living, both in their 44s. The patient's father was diagnosed with bladder cancer the age of 30. The patient's mother's mother had some type of gastrointestinal cancer. The patient had one brother, no sisters. There is no history of breast or ovarian cancer in the family other than a cousin on the father's side who was diagnosed with breast cancer apparently before the age of 19.  GYNECOLOGIC HISTORY:   (Reviewed 10/13/2013) Menarche age 26, first live birth age 19, the patient is [name] P2. She went through menopause in 2008. She did not take hormone replacement. She took birth control for approximately 22 years remotely.  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 She's currently on disability. Her husband Carrie Berry Carrie Berry"), is vice Development worker, international aid for [employer]. The patient's daughter Carrie Berry is a  stay-at-home mom in [place], the patient's son Carrie Berry unfortunately died in an automobile accident in December of 2011. The patient has 2 grandchildren. She attends to a local [employer].    ADVANCED DIRECTIVES: Not in place   HEALTH MAINTENANCE: (Updated 10/13/2013) History  Substance Use Topics  . Smoking status: Never Smoker   . Smokeless tobacco: Never Used  . Alcohol Use: No     Colonoscopy: 2005  PAP: Not on file  Bone density: Not on file  Lipid panel:  Not on file/Dr. Polite   Allergies  Allergen Reactions  . Ondansetron Hcl Other (See Comments)    HEADACHE, patient states she still takes   . Codeine Nausea And Vomiting    "violently ill"  . Penicillins Rash    Childhood reaction -    Current Outpatient Prescriptions  Medication Sig Dispense Refill  . acetaminophen (TYLENOL) 325 MG tablet Take 650 mg by mouth every 8 (eight) hours as needed for pain.    Marland Kitchen dexamethasone (DECADRON) 4 MG tablet Take 1 tablet (4 mg total) by mouth every 12 (twelve) hours. 60 tablet 0  . docusate sodium 100 MG CAPS Take 100 mg by mouth daily. 10 capsule 0  .  levETIRAcetam (KEPPRA) 1000 MG tablet Take 1 tablet (1,000 mg total) by mouth 2 (two) times daily. 60 tablet 2  . methylphenidate (RITALIN) 5 MG tablet Take 1 tablet (5 mg total) by mouth 2 (two) times daily with breakfast and lunch. 60 tablet 1  . mirtazapine (REMERON) 30 MG tablet Take 1 tablet (30 mg total) by mouth at bedtime. 30 tablet 1  . Olopatadine HCl (PATADAY) 0.2 % SOLN Place 1 drop into both eyes daily as needed (dry eyes). 1 Bottle 0  . ondansetron (ZOFRAN) 4 MG tablet Take 1 tablet (4 mg total) by mouth every 8 (eight) hours as needed for nausea or vomiting. 20 tablet 0  . pantoprazole (PROTONIX) 40 MG tablet Take 1 tablet (40 mg total) by mouth daily. 30 tablet 0  . polyethylene glycol (MIRALAX / GLYCOLAX) packet Take 17 g by mouth daily. 30 each 2  . prochlorperazine (COMPAZINE) 5 MG tablet Take  1 tablet (5 mg total) by mouth 4 (four) times daily -  before meals and at bedtime. 120 tablet 0  . tobramycin-dexamethasone (TOBRADEX) ophthalmic solution Place 1 drop into both eyes daily as needed. For dry eyes per patient 5 mL 0  . traMADol (ULTRAM) 50 MG tablet Take 2 tablets (100 mg total) by mouth every 6 (six) hours as needed. 60 tablet 0  . traZODone (DESYREL) 50 MG tablet Take 2 tablets (100 mg total) by mouth at bedtime. 60 tablet 0  . triamcinolone (NASACORT) 55 MCG/ACT nasal inhaler Place 2 sprays into the nose daily.     No current facility-administered medications for this visit.    OBJECTIVE: Middle-aged white woman examined in a wheelchair Filed Vitals:   04/03/14 1420  BP: 114/64  Pulse: 92  Temp: 98.9 F (37.2 C)  Resp: 18     Body mass index is 18.32 kg/(m^2).    ECOG FS: 2 Filed Weights   04/03/14 1420  Weight: 103 lb 6.4 oz (46.902 kg)    Sclerae unicteric, pupils round, equal, with normal EOMs Oropharynx shows thrush No cervical or supraclavicular adenopathy Lungs no rales or rhonchi Heart regular rate and rhythm Abd soft, nontender, positive bowel sounds MSK no focal spinal tenderness, no upper extremity lymphedema Neuro: nonfocal (5 minus throughout), well oriented, appropriate affect Breasts: There is a nodule adjacent to the right mastectomy scar. This is very discrete, measures three quarters of a centimeter, is raised and erythematous, consistent with local recurrence.  LAB RESULTS:   Lab Results  Component Value Date   WBC 10.2 04/03/2014   NEUTROABS 8.4* 04/03/2014   HGB 12.8 04/03/2014   HCT 36.9 04/03/2014   MCV 96.3 04/03/2014   PLT 174 04/03/2014      Chemistry      Component Value Date/Time   NA 139 03/21/2014 0504   NA 141 02/02/2014 1550   K 3.9 03/21/2014 0504   K 4.0 02/02/2014 1550   CL 103 03/21/2014 0504   CO2 28 03/21/2014 0504   CO2 26 02/02/2014 1550   BUN 13 03/21/2014 0504   BUN 18.6 02/02/2014 1550   CREATININE  0.49* 03/21/2014 0504   CREATININE 0.9 02/02/2014 1550      Component Value Date/Time   CALCIUM 8.5 03/21/2014 0504   CALCIUM 9.4 02/02/2014 1550   ALKPHOS 70 03/20/2014 0522   ALKPHOS 99 02/02/2014 1550   AST 23 03/20/2014 0522   AST 26 02/02/2014 1550   ALT 34 03/20/2014 0522   ALT 18 02/02/2014 1550   BILITOT  0.4 03/20/2014 0522   BILITOT 0.37 02/02/2014 1550        STUDIES: Dg Abd Portable 1v  03/08/2014   CLINICAL DATA:  Constipation.  EXAM: PORTABLE ABDOMEN - 1 VIEW  COMPARISON:  CT abdomen and pelvis 02/28/2014.  FINDINGS: Oral contrast from patient's CT scan is in the descending colon to the rectum where a moderate stool burden is identified. The bowel gas pattern is nonobstructive.  IMPRESSION: No acute finding. Moderate stool burden descending colon to the rectum.   Electronically Signed   By: Inge Rise M.D.   On: 03/08/2014 18:30      ASSESSMENT: 63 y.o. [name], [place] woman status post right breast upper outer quadrant and right axillary lymph node biopsy 12/23/2012 for a clinical T3 N1 M1, stage IV invasive ductal carcinoma, grade 3, triple negative, with an MIB-1 of 86%.  (1) right liver lobe biopsy 01/21/2013 confirms metastatic adenocarcinoma; scans show involvement of the liver, lungs, and likely bone.  (2) enrolled in [facility] study [id number] (docetaxel + oral gamma secretase inibitor [id number]), received one cycle starting 02/04/2013  but withdrew because of poor tolerance despite treatment interruptions and decreased dosing of the oral component  (3)  chest CT in early November 2014 was compared with studies in August 2014 and showed interval progression of metastatic breast cancer, with an enlarging primary right breast mass, enlarging right axillary lymph nodes, enlarging pulmonary nodules and new/enlarging hepatic metastases.  (4) Abraxane started 03/19/2013, given day 1 and day 8 of each 21 day cycle, stopped with the day 1 cycle 3 dose (04/29/2013) because  of local progression of disease  (5) cyclophosphamide and doxorubicin started 05/20/2013, given in dose dense fashion  with Neulasta support on day 2. Completed 4 cycles 07/03/2013, with the final cycle dose reduced 15% because of an episode of febrile neutropenia after cycle 3. Restaging studies 07/15/2013 showed partial response.  (6) started capecitabine April 2015, 1.5 g by mouth twice a day, 7 days on 7 days off--discontinued October 2015 with progression  (7) s/p Right simple mastectomy 02/17/2014 to optimize local control, the pathology ([id number]) showing an invasive ductal carcinoma measuring 4.8 cm, grade 3, with close but negative margins and evidence of treatment response   (a) nodule on right mastectomy scar noted 04/03/2014  (8) multiple brain metastases noted on brain MRI 02/26/2014--s/p whole brain irradiation completed 03/17/2014  (9) protein-calorie malnutrition  (10) high fall risk  (11) advanced directives: Not in place; husband is healthcare power of attorney    PLAN: I spent approximately 50 minutes with K and mic going over her situation. He understand that case tumor is not curable. Furthermore the peripheral disease is generally well-controlled. She has very small pulmonary nodules, and now she has a nodule on the right chest wall which probably should be excised to optimize the chance of local control. (I would not proceed with radiation to that area).  The problem of course is the brain. She had multiple brain lesions, which is the reason she had to have whole brain irradiation and could not receive stereotactic radiation. I wiould not be surprised if when we repeat her brain MRI we see recurrent disease there. For that reason I am not in a hurry to treat the peripheral disease, which is currently entirely asymptomatic  This was difficult for Carrie Berry to grasp. I was  direct with him and explained that Cady very likely will die from this disease in the next few months  and that making her  sick with chemotherapy to shrink a very small lung lesion really would not contribute either to her survival or to her quality of life.  On the other hand I strongly supported and affirmed what they are doing now, which is good palliative care. They're working on nutrition, they are aware of fall risks (which I think is the main problem for now), there are no big issues regarding constipation, pain, or confusion. Of course Corlette has a much more limited functional status than before, but she spends most of the day out of bed and is still able to make her own decisions.  Accordingly the plan is to proceed to a brain MRI late December, probably after Christmas. They will see me again in early January. If there is good control in the brain we will consider resuming capecitabine or other palliative treatment to keep the peripheral disease under control.  We discussed hospice today. Carrie Berry is not in favor of the idea. They understand that remains an option for them.  Taleeya has a good understanding of the overall plan. She agrees with it. They will call with any problems that may develop before her next visit here  Chauncey Cruel, MD   04/03/2014 2:26 PM

## 2014-04-04 MED ORDER — FLUCONAZOLE 100 MG PO TABS: 100.0000 mg | ORAL_TABLET | Freq: Every day | ORAL | Status: DC

## 2014-04-04 NOTE — Addendum Note (Signed)
Addended by: Chauncey Cruel on: 04/04/2014 08:22 AM   Modules accepted: Orders

## 2014-04-06 NOTE — Addendum Note (Signed)
Addended by: Laureen Abrahams on: 04/06/2014 07:02 PM   Modules accepted: Orders, Medications

## 2014-04-11 ENCOUNTER — Other Ambulatory Visit: Payer: Self-pay | Admitting: Oncology

## 2014-04-11 DIAGNOSIS — C50411 Malignant neoplasm of upper-outer quadrant of right female breast: Secondary | ICD-10-CM

## 2014-04-13 ENCOUNTER — Encounter: Payer: Self-pay | Admitting: *Deleted

## 2014-04-13 ENCOUNTER — Other Ambulatory Visit: Payer: Self-pay | Admitting: *Deleted

## 2014-04-13 NOTE — Progress Notes (Signed)
Called patient for clarification of omeprazole refill.  Reports she does not take protonix.

## 2014-04-14 ENCOUNTER — Other Ambulatory Visit: Payer: Self-pay | Admitting: Oncology

## 2014-04-14 ENCOUNTER — Other Ambulatory Visit: Payer: Self-pay | Admitting: *Deleted

## 2014-04-14 ENCOUNTER — Other Ambulatory Visit: Payer: Self-pay | Admitting: Internal Medicine

## 2014-04-14 DIAGNOSIS — C50919 Malignant neoplasm of unspecified site of unspecified female breast: Secondary | ICD-10-CM

## 2014-04-14 DIAGNOSIS — C50911 Malignant neoplasm of unspecified site of right female breast: Secondary | ICD-10-CM

## 2014-04-14 DIAGNOSIS — R627 Adult failure to thrive: Secondary | ICD-10-CM

## 2014-04-14 DIAGNOSIS — C7931 Secondary malignant neoplasm of brain: Secondary | ICD-10-CM

## 2014-04-14 DIAGNOSIS — K5900 Constipation, unspecified: Secondary | ICD-10-CM

## 2014-04-14 MED ORDER — METHYLPHENIDATE HCL 5 MG PO TABS: 5.0000 mg | ORAL_TABLET | Freq: Two times a day (BID) | ORAL | Status: DC

## 2014-04-15 ENCOUNTER — Telehealth: Payer: Self-pay | Admitting: *Deleted

## 2014-04-15 NOTE — Telephone Encounter (Signed)
Per MD discussion with pt's husband - [name]- this RN called consult for visit to discuss care and benefit hospice services can provide.  Of note per conversation with [name] - " he is still not fully understanding Emileigh's active terminal status -but he is interested in possible benefits of having hospice services in the home "  The above was relayed to [facility] intake for understanding that benefits of service need to be discussed and family will proceed with decision to obtain.

## 2014-04-16 ENCOUNTER — Ambulatory Visit: Payer: BC Managed Care – PPO | Admitting: Radiation Oncology

## 2014-04-16 ENCOUNTER — Encounter: Payer: Self-pay | Admitting: Oncology

## 2014-04-16 ENCOUNTER — Other Ambulatory Visit: Payer: Self-pay | Admitting: Oncology

## 2014-04-16 NOTE — Progress Notes (Unsigned)
I was called by Carrie Berry, Carrie Berry's husband, with a variety of concerns. He wondered if they really needed to see Dr. Manning 04/16/2014. He wondered if there was any study at [facility] that she might benefit from since she was tested there through the [facility] program. (Of course she originally participated in their docetaxel/GSI study and did not tolerate the treatment well). He wondered if hospice should become involved (I think is hope is that they could provide care for her in the home several hours a day). He also told me that K was "much improved, and probably can stay by herself if you're hours every day".  I alerted Dr. Manning regarding Carrie Berry's questions and he has canceled his appointment with her. We are going to obtain a repeat MRI of the brain later this month and we can get back to Dr. Manning with those results.  I also contacted [facility]. Dr. 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 Dr. Anders suggested if the patient needed treatment for her peripheral disease she would favor carboplatin. Their current brain metastatic trials are for estrogen receptor positive or HER-2 positive patients at present. She mentioned a [employer] liposomal SN38 trial that might be of interest.  Finally I placed a hospice referral, alerting them that the family is really more interested in information and support at this point then coming under hospice umbrella for terminal care.  Carrie Berry's  

## 2014-04-16 NOTE — Progress Notes (Signed)
Called and left [facility] message. The insurance we have is [employer] only

## 2014-04-20 NOTE — Telephone Encounter (Signed)
RECEIVED A FAX FROM [facility] CONCERNING A PRIOR AUTHORIZATION FOR METHYLPHENIDATE. THIS REQUEST WAS PLACED IN THE MANAGED CARE BIN.

## 2014-04-21 ENCOUNTER — Encounter: Payer: Self-pay | Admitting: Oncology

## 2014-04-21 NOTE — Progress Notes (Signed)
[facility] approved methylphenidate from 03/31/14-04/21/15

## 2014-04-24 ENCOUNTER — Other Ambulatory Visit (INDEPENDENT_AMBULATORY_CARE_PROVIDER_SITE_OTHER): Payer: Self-pay | Admitting: General Surgery

## 2014-04-24 DIAGNOSIS — C50919 Malignant neoplasm of unspecified site of unspecified female breast: Secondary | ICD-10-CM

## 2014-04-28 ENCOUNTER — Other Ambulatory Visit: Payer: Self-pay | Admitting: Oncology

## 2014-04-29 ENCOUNTER — Encounter (INDEPENDENT_AMBULATORY_CARE_PROVIDER_SITE_OTHER): Payer: Self-pay | Admitting: General Surgery

## 2014-04-29 NOTE — Progress Notes (Unsigned)
Patient ID: Carrie Berry, female   DOB: [birth year], 63 y.o.   MRN: [id number]  Skin (M), right chest wall MODERATELY TO POORLY DIFFERENTIATED METASTATIC CARCINOMA, CONSISTENT WITH METASTATIC BREAST CARCINOMA  Pathology of right chest wall nodule as above.  Discussed with her.

## 2014-04-30 ENCOUNTER — Other Ambulatory Visit: Payer: Self-pay | Admitting: Oncology

## 2014-05-01 ENCOUNTER — Emergency Department (HOSPITAL_COMMUNITY): Payer: BC Managed Care – PPO

## 2014-05-01 ENCOUNTER — Other Ambulatory Visit: Payer: Self-pay | Admitting: Oncology

## 2014-05-01 ENCOUNTER — Other Ambulatory Visit (HOSPITAL_BASED_OUTPATIENT_CLINIC_OR_DEPARTMENT_OTHER): Payer: BC Managed Care – PPO

## 2014-05-01 ENCOUNTER — Ambulatory Visit (HOSPITAL_BASED_OUTPATIENT_CLINIC_OR_DEPARTMENT_OTHER): Payer: BC Managed Care – PPO | Admitting: Oncology

## 2014-05-01 ENCOUNTER — Encounter (HOSPITAL_COMMUNITY): Payer: Self-pay | Admitting: Emergency Medicine

## 2014-05-01 ENCOUNTER — Emergency Department (HOSPITAL_COMMUNITY)
Admission: EM | Admit: 2014-05-01 | Discharge: 2014-05-01 | Disposition: A | Discharge status: Home / Self Care | Payer: BC Managed Care – PPO | Attending: Emergency Medicine | Admitting: Emergency Medicine

## 2014-05-01 ENCOUNTER — Telehealth: Payer: Self-pay | Admitting: *Deleted

## 2014-05-01 VITALS — BP 108/71 | HR 99 | Temp 98.3°F | Resp 18 | Ht 63.0 in

## 2014-05-01 DIAGNOSIS — C7931 Secondary malignant neoplasm of brain: Secondary | ICD-10-CM

## 2014-05-01 DIAGNOSIS — F419 Anxiety disorder, unspecified: Secondary | ICD-10-CM | POA: Diagnosis not present

## 2014-05-01 DIAGNOSIS — M1990 Unspecified osteoarthritis, unspecified site: Secondary | ICD-10-CM | POA: Insufficient documentation

## 2014-05-01 DIAGNOSIS — Z85841 Personal history of malignant neoplasm of brain: Secondary | ICD-10-CM | POA: Insufficient documentation

## 2014-05-01 DIAGNOSIS — R52 Pain, unspecified: Secondary | ICD-10-CM

## 2014-05-01 DIAGNOSIS — M25561 Pain in right knee: Secondary | ICD-10-CM | POA: Diagnosis not present

## 2014-05-01 DIAGNOSIS — Z8505 Personal history of malignant neoplasm of liver: Secondary | ICD-10-CM | POA: Diagnosis not present

## 2014-05-01 DIAGNOSIS — Z8709 Personal history of other diseases of the respiratory system: Secondary | ICD-10-CM | POA: Diagnosis not present

## 2014-05-01 DIAGNOSIS — M25571 Pain in right ankle and joints of right foot: Secondary | ICD-10-CM | POA: Insufficient documentation

## 2014-05-01 DIAGNOSIS — Z9221 Personal history of antineoplastic chemotherapy: Secondary | ICD-10-CM | POA: Insufficient documentation

## 2014-05-01 DIAGNOSIS — E46 Unspecified protein-calorie malnutrition: Secondary | ICD-10-CM

## 2014-05-01 DIAGNOSIS — K219 Gastro-esophageal reflux disease without esophagitis: Secondary | ICD-10-CM | POA: Insufficient documentation

## 2014-05-01 DIAGNOSIS — C50411 Malignant neoplasm of upper-outer quadrant of right female breast: Secondary | ICD-10-CM

## 2014-05-01 DIAGNOSIS — C50911 Malignant neoplasm of unspecified site of right female breast: Secondary | ICD-10-CM

## 2014-05-01 DIAGNOSIS — Z853 Personal history of malignant neoplasm of breast: Secondary | ICD-10-CM | POA: Insufficient documentation

## 2014-05-01 DIAGNOSIS — M25572 Pain in left ankle and joints of left foot: Secondary | ICD-10-CM | POA: Diagnosis not present

## 2014-05-01 DIAGNOSIS — C7989 Secondary malignant neoplasm of other specified sites: Secondary | ICD-10-CM

## 2014-05-01 DIAGNOSIS — C787 Secondary malignant neoplasm of liver and intrahepatic bile duct: Secondary | ICD-10-CM

## 2014-05-01 DIAGNOSIS — Z862 Personal history of diseases of the blood and blood-forming organs and certain disorders involving the immune mechanism: Secondary | ICD-10-CM | POA: Diagnosis not present

## 2014-05-01 DIAGNOSIS — M79604 Pain in right leg: Secondary | ICD-10-CM

## 2014-05-01 DIAGNOSIS — Z7952 Long term (current) use of systemic steroids: Secondary | ICD-10-CM | POA: Diagnosis not present

## 2014-05-01 DIAGNOSIS — M25562 Pain in left knee: Secondary | ICD-10-CM | POA: Insufficient documentation

## 2014-05-01 DIAGNOSIS — Z79899 Other long term (current) drug therapy: Secondary | ICD-10-CM | POA: Diagnosis not present

## 2014-05-01 DIAGNOSIS — M25552 Pain in left hip: Secondary | ICD-10-CM | POA: Diagnosis not present

## 2014-05-01 DIAGNOSIS — M79605 Pain in left leg: Secondary | ICD-10-CM

## 2014-05-01 DIAGNOSIS — M25551 Pain in right hip: Secondary | ICD-10-CM | POA: Insufficient documentation

## 2014-05-01 DIAGNOSIS — Z880 Allergy status to penicillin: Secondary | ICD-10-CM | POA: Insufficient documentation

## 2014-05-01 DIAGNOSIS — C7800 Secondary malignant neoplasm of unspecified lung: Secondary | ICD-10-CM

## 2014-05-01 LAB — CBC WITH DIFFERENTIAL/PLATELET
BASO%: 0.1 % (ref 0.0–2.0)
Basophils Absolute: 0 10*3/uL (ref 0.0–0.1)
EOS ABS: 0 10*3/uL (ref 0.0–0.5)
EOS%: 0.3 % (ref 0.0–7.0)
HCT: 39.6 % (ref 34.8–46.6)
HGB: 13 g/dL (ref 11.6–15.9)
LYMPH%: 3.4 % — ABNORMAL LOW (ref 14.0–49.7)
MCH: 32.3 pg (ref 25.1–34.0)
MCHC: 33 g/dL (ref 31.5–36.0)
MCV: 97.9 fL (ref 79.5–101.0)
MONO#: 0.4 10*3/uL (ref 0.1–0.9)
MONO%: 3.1 % (ref 0.0–14.0)
NEUT%: 93.1 % — ABNORMAL HIGH (ref 38.4–76.8)
NEUTROS ABS: 12 10*3/uL — AB (ref 1.5–6.5)
PLATELETS: 192 10*3/uL (ref 145–400)
RBC: 4.04 10*6/uL (ref 3.70–5.45)
RDW: 15.2 % — AB (ref 11.2–14.5)
WBC: 12.9 10*3/uL — AB (ref 3.9–10.3)
lymph#: 0.4 10*3/uL — ABNORMAL LOW (ref 0.9–3.3)

## 2014-05-01 LAB — COMPREHENSIVE METABOLIC PANEL (CC13)
ALT: 45 U/L (ref 0–55)
ANION GAP: 11 meq/L (ref 3–11)
AST: 24 U/L (ref 5–34)
Albumin: 3 g/dL — ABNORMAL LOW (ref 3.5–5.0)
Alkaline Phosphatase: 84 U/L (ref 40–150)
BUN: 19.1 mg/dL (ref 7.0–26.0)
CALCIUM: 8.7 mg/dL (ref 8.4–10.4)
CO2: 25 meq/L (ref 22–29)
Chloride: 97 mEq/L — ABNORMAL LOW (ref 98–109)
Creatinine: 0.5 mg/dL — ABNORMAL LOW (ref 0.6–1.1)
EGFR: >90 mL/min/{1.73_m2} (ref >90)
GLUCOSE: 109 mg/dL (ref 70–140)
Potassium: 4.4 mEq/L (ref 3.5–5.1)
SODIUM: 133 meq/L — AB (ref 136–145)
Total Bilirubin: 0.39 mg/dL (ref 0.20–1.20)
Total Protein: 5.8 g/dL — ABNORMAL LOW (ref 6.4–8.3)

## 2014-05-01 LAB — TECHNOLOGIST REVIEW

## 2014-05-01 MED ORDER — HYDROMORPHONE HCL 1 MG/ML IJ SOLN
0.5000 mg | Freq: Once | INTRAMUSCULAR | Status: AC
  Administered 2014-05-01: 0.5 mg via INTRAVENOUS
  Filled 2014-05-01: qty 1

## 2014-05-01 MED ORDER — HYDROMORPHONE HCL 2 MG PO TABS: 1.0000 mg | ORAL_TABLET | ORAL | Status: DC | PRN

## 2014-05-01 MED ORDER — HEPARIN SOD (PORK) LOCK FLUSH 100 UNIT/ML IV SOLN
500.0000 [IU] | Freq: Once | INTRAVENOUS | Status: AC
  Administered 2014-05-01: 500 [IU]
  Filled 2014-05-01: qty 5

## 2014-05-01 NOTE — Progress Notes (Signed)
. Patient ID: JAKERIA CAISSIE, female   DOB: [birth year], 63 y.o.   MRN: [id number] ID: [name] OB: 02-01-1951  MR#: [id number]  CSN#:[id number]  PCP: [name], MD GYN:   SU: [name] OTHER MD: Thea Silversmith, Earle Gell, Rick Cornella, Heath Gold  CHIEF COMPLAINT:  Metastatic Breast Cancer CURRENT TREATMENT: observation  BREAST CANCER HISTORY: From the originally intake note:  Raiana noted a mass in her right breast December of 2013, but did not think much of it. It did grow some lower the summer, but she was keeping her grandchild at that time and was too busy so she did not bring it to Dr. Bernell List attention until August. He set her up for bilateral mammography and ultrasonography at [facility] 12/18/2012 and this measured, on the right, a large irregular lobulated mass in the upper outer quadrant which by ultrasound measured 5.7 cm. The right axilla showed some lymph nodes with thickened cortices. In the left breast there was a focal asymmetry at the depth, lateral to the nipple, and by ultrasound there was a 9 mm ill-defined hypoechoic lesion in this location. Biopsy of the left breast lesion showed only fibrocystic changes.  Biopsy of the right breast mass and right axillary adenopathy ([id number]) on 12/23/2012 showed both to be involved by invasive ductal carcinoma, grade 2, triple negative, with an MIB-1 of 86%.  MRI of the breast 12/27/2012 at [facility] showed in the right breast a mass abutting the pectoralis muscle without enhancement of the muscle measuring 4.8 cm. The satellite nodule measuring approximately 3 mm was also noted superior to the mass and several abnormal and enlarged right axillary lymph nodes were noted, one of which appeared to be necrotic. The largest node measured 2.0 cm. Unfortunately, numerous bilateral pulmonary nodules were also noted.  The patient's subsequent history is as detailed below   INTERVAL HISTORY: Shatona returns  today foran unscheduled visit accompanied by her husband [name]. Rielle has been making progress at home. Just 2 days ago she went out to eat and shop with her daughter Keilman was in a wheelchair). She has been gaining weight, and walking around with a walker at home. Last night at approximately 1:30 AM she will cut with terrific pain in her left knee area, also in her left ankle, but not in the calf or tibia. [name] tells me he palpated the knee and did not feel anything and the palpation also did not increase the pain. I gave her some tramadol but as the pain did not improve they called 911 and she came to the emergency room where she was evaluated. There was no obvious effusion or erythema by exam, and plain films of those areas were negative. She was given 0.5 mg of dilated IV with good pain control and sent home with a few dilated tablets, instructed to take half of a 2 mg tablet as needed for pain.-- since her last visit here she also had a local recurrence of her tumor at the right mastectomy incision removed by Dr. Clarise Cruz (04/24/2014) pathology of course was consistent with a breast primary.  REVIEW OF SYSTEMS Yavonne Kiss me that right now she does not have pain in her left knee or ankle or really either leg. She took 1 mg of Dilaudid early this morning but has had no pain medication for the last 6 hours at least. She did have a little bit of nausea from the Dilaudid, and she took Compazine for  that. In the past the Compazine has made her a little groggy and a little jittery as well. She tells me she is having normal bowel movements on stool softeners and occasional Merrill X. She denies headaches. She tells me her vision is much better. Her husband also points out that her speech is much clearer as this or thinking. Her tremor is gone. There have been no recent falls and she has never had a seizure. She does remain easily fatigued. Her appetite is improved. A detailed review of systems today was otherwise  stable  PAST MEDICAL HISTORY: Past Medical History  Diagnosis Date  . Recurrent sinus infections   . Arthritis   . PONV (postoperative nausea and vomiting)   . GERD (gastroesophageal reflux disease)   . 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 Anxiety     no problems at present  . rt breast ca dx'd 12/2011    breast  . Breast cancer   . 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 Tubal ligation    . Tubal ligation  1985  . Portacath placement N/A 01/10/2013    Procedure: ULTRASOUND GUIDED PORT-A-CATH INSERTION WITH FLUOROSCOPY;  Surgeon: [name], MD;  Location: [place];  Service: General;  Laterality: N/A;  . Esophagogastroduodenoscopy N/A 08/19/2013    Procedure: ESOPHAGOGASTRODUODENOSCOPY (EGD);  Surgeon: Garlan Fair, MD;  Location: [facility];  Service: Endoscopy;  Laterality: N/A;  . Total mastectomy Right 02/17/2014    Procedure: PALLIATIVE  RIGHT MASTECTOMY;  Surgeon: [name], MD;  Location: [place];  Service: General;  Laterality: Right;    FAMILY HISTORY Family History  Problem Relation Age of Onset  . Bladder Cancer Father   . Colon cancer Maternal Grandmother    the patient's parents are living, both in their 54s. The patient's father was diagnosed with bladder cancer the age of 48. The patient's mother's mother had some type of gastrointestinal cancer. The patient had one brother, no sisters. There is no history of breast or ovarian cancer in the family other than a cousin on the father's side who was diagnosed with breast cancer apparently before the age of 30.  GYNECOLOGIC HISTORY:   (Reviewed 10/13/2013) Menarche age 66, first live birth age 11, the patient is [place] P2. She went through menopause in 2008. She did not take hormone replacement. She took birth control for approximately 22 years remotely.  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 She's currently on disability. Her husband Rochel Privett [name]"), is vice Development worker, international aid for [employer]. The patient's daughter [name] is a stay-at-home mom in [place], the patient's son Jachelle Fluty unfortunately died in an automobile accident in December of 2011. The patient has 2 grandchildren. She attends to a local [employer].    ADVANCED DIRECTIVES: Not in place   HEALTH MAINTENANCE: (Updated 10/13/2013) History  Substance Use Topics  . Smoking status: Never Smoker   . Smokeless tobacco: Never Used  . Alcohol Use: No     Colonoscopy: 2005  PAP: Not on file  Bone density: Not on file  Lipid panel:  Not on file/Dr. Polite   Allergies  Allergen Reactions  . Ondansetron Hcl Other (See Comments)    HEADACHE, patient states she still takes   . Codeine Nausea And Vomiting    "  violently ill"  . Penicillins Rash    Childhood reaction -    Current Outpatient Prescriptions  Medication Sig Dispense Refill  . dexamethasone (DECADRON) 4 MG tablet 2 TABLETS ONCE A DAY ON DAY AFTER CHEMOTHERAPY,THEN TAKE 2 TABLETS DAILY FOR 2 DAYS. TAKE WITH FOOD (Patient not taking: Reported on 05/01/2014) 30 tablet 1  . dexamethasone (DECADRON) 4 MG tablet Take 8 mg by mouth daily.    Marland Kitchen docusate sodium 100 MG CAPS Take 100 mg by mouth daily. 10 capsule 0  . fluconazole (DIFLUCAN) 100 MG tablet Take 1 tablet (100 mg total) by mouth daily. (Patient not taking: Reported on 05/01/2014) 30 tablet 4  . HYDROmorphone (DILAUDID) 2 MG tablet Take 0.5 tablets (1 mg total) by mouth every 4 (four) hours as needed. 10 tablet 0  . levETIRAcetam (KEPPRA) 1000 MG tablet Take 1,000 mg by mouth every 12 (twelve) hours.  0  . methylphenidate (RITALIN) 5 MG tablet Take 1 tablet (5 mg total) by mouth 2 (two) times daily with breakfast and lunch. 60 tablet 0  . mirtazapine  (REMERON) 30 MG tablet Take 1 tablet (30 mg total) by mouth at bedtime. 30 tablet 1  . Olopatadine HCl (PATADAY) 0.2 % SOLN Place 1 drop into both eyes daily as needed (dry eyes). 1 Bottle 0  . omeprazole (PRILOSEC) 20 MG capsule TAKE 1 CAPSULE (20 MG TOTAL) BY MOUTH 2 (TWO) TIMES DAILY BEFORE A MEAL. (Patient not taking: Reported on 05/01/2014) 60 capsule 1  . omeprazole (PRILOSEC) 20 MG capsule Take 20 mg by mouth 2 (two) times daily before a meal.    . ondansetron (ZOFRAN) 4 MG tablet Take 1 tablet (4 mg total) by mouth every 8 (eight) hours as needed for nausea or vomiting. (Patient not taking: Reported on 05/01/2014) 20 tablet 0  . pantoprazole (PROTONIX) 40 MG tablet Take 1 tablet (40 mg total) by mouth daily. (Patient not taking: Reported on 04/13/2014) 30 tablet 0  . polyethylene glycol (MIRALAX / GLYCOLAX) packet Take 17 g by mouth daily. (Patient not taking: Reported on 05/01/2014) 30 each 2  . prochlorperazine (COMPAZINE) 5 MG tablet Take 1 tablet (5 mg total) by mouth 4 (four) times daily -  before meals and at bedtime. (Patient not taking: Reported on 05/01/2014) 120 tablet 0  . tobramycin-dexamethasone (TOBRADEX) ophthalmic solution Place 1 drop into both eyes every 6 (six) hours as needed (dry eyes).     . traMADol (ULTRAM) 50 MG tablet Take 50 mg by mouth every 6 (six) hours as needed for moderate pain.     . traZODone (DESYREL) 50 MG tablet Take 2 tablets (100 mg total) by mouth at bedtime. 60 tablet 0  . triamcinolone (NASACORT) 55 MCG/ACT nasal inhaler Place 2 sprays into the nose daily.     No current facility-administered medications for this visit.    OBJECTIVE: Middle-aged white woman examined in a wheelchair Filed Vitals:   05/01/14 1443  BP: 108/71  Pulse: 99  Temp: 98.3 F (36.8 C)  Resp: 18     Body mass index is 0.00 kg/(m^2).    ECOG FS: 2 Filed Weights    Sclerae unicteric, pupils round, And reactive, EOMs full Oropharynx clear, teeth in good repair No  cervical or supraclavicular adenopathy Lungs no rales or rhonchi Heart regular rate and rhythm Abd soft, nontender, positive bowel sounds MSK no focal spinal tenderness, no upper extremity lymphedema; no left knee effusion or erythema, no ankle swelling Neuro: diffusely 5-2 motor testing, with  the right upper extremity push/pull just a little bit weaker than the left. She has a good grip bilaterally. Knee extension bilaterally is 5 minus as is dorsiflexion. Breasts: the nodule noted last visit adjacent to the right mastectomy scar has been removed. There is currently no obvious tumor on the right chest wall. Stitches are still in place. The right axilla is benign.  LAB RESULTS:   Lab Results  Component Value Date   WBC 12.9* 05/01/2014   NEUTROABS 12.0* 05/01/2014   HGB 13.0 05/01/2014   HCT 39.6 05/01/2014   MCV 97.9 05/01/2014   PLT 192 05/01/2014      Chemistry      Component Value Date/Time   NA 133* 05/01/2014 1425   NA 139 03/21/2014 0504   K 4.4 05/01/2014 1425   K 3.9 03/21/2014 0504   CL 103 03/21/2014 0504   CO2 25 05/01/2014 1425   CO2 28 03/21/2014 0504   BUN 19.1 05/01/2014 1425   BUN 13 03/21/2014 0504   CREATININE 0.5* 05/01/2014 1425   CREATININE 0.49* 03/21/2014 0504      Component Value Date/Time   CALCIUM 8.7 05/01/2014 1425   CALCIUM 8.5 03/21/2014 0504   ALKPHOS 84 05/01/2014 1425   ALKPHOS 70 03/20/2014 0522   AST 24 05/01/2014 1425   AST 23 03/20/2014 0522   ALT 45 05/01/2014 1425   ALT 34 03/20/2014 0522   BILITOT 0.39 05/01/2014 1425   BILITOT 0.4 03/20/2014 0522        STUDIES: Dg Pelvis 1-2 Views  05/01/2014   CLINICAL DATA:  Bilateral hip pain, no trauma. History of breast cancer.  EXAM: PELVIS - 1-2 VIEW  COMPARISON:  Abdominal radiograph March 08, 2014  FINDINGS: There is no evidence of pelvic fracture or diastasis. No pelvic bone lesions are seen. Partially imaged moderate amount of retained large bowel stool.  IMPRESSION: No  acute osseous process.  Partially imaged moderate amount of retained large bowel stool.   Electronically Signed   By: Elon Alas   On: 05/01/2014 05:32   Dg Ankle Complete Left  05/01/2014   CLINICAL DATA:  Ankle pain.  History of stage IV breast cancer.  EXAM: LEFT ANKLE COMPLETE - 3+ VIEW  COMPARISON:  None.  FINDINGS: There is no evidence of fracture, dislocation, or joint effusion. There is no evidence of arthropathy or other focal bone abnormality. Soft tissues are unremarkable.  IMPRESSION: Negative.   Electronically Signed   By: Jorje Guild M.D.   On: 05/01/2014 04:09   Dg Knee Complete 4 Views Left  05/01/2014   CLINICAL DATA:  Knee pain without injury. Stage IV metastatic breast cancer.  EXAM: LEFT KNEE - COMPLETE 4+ VIEW  COMPARISON:  None.  FINDINGS: There is no evidence of fracture, dislocation, or joint effusion. There is no evidence of arthropathy or other focal bone abnormality. Soft tissues are unremarkable.  IMPRESSION: Negative. If there is ongoing unexplained knee pain, bone scan could evaluate for occult metastasis.   Electronically Signed   By: Jorje Guild M.D.   On: 05/01/2014 04:08      ASSESSMENT: 63 y.o. [name], [place] woman status post right breast upper outer quadrant and right axillary lymph node biopsy 12/23/2012 for a clinical T3 N1 M1, stage IV invasive ductal carcinoma, grade 3, triple negative, with an MIB-1 of 86%.  (1) right liver lobe biopsy 01/21/2013 confirms metastatic adenocarcinoma; scans show involvement of the liver, lungs, and likely bone.  (2) enrolled in [facility] study  [id number] (docetaxel + oral gamma secretase inibitor [id number]), received one cycle starting 02/04/2013  but withdrew because of poor tolerance despite treatment interruptions and decreased dosing of the oral component  (3)  chest CT in early November 2014 was compared with studies in August 2014 and showed interval progression of metastatic breast cancer, with an enlarging  primary right breast mass, enlarging right axillary lymph nodes, enlarging pulmonary nodules and new/enlarging hepatic metastases.  (4) Abraxane started 03/19/2013, given day 1 and day 8 of each 21 day cycle, stopped with the day 1 cycle 3 dose (04/29/2013) because of local progression of disease  (5) cyclophosphamide and doxorubicin started 05/20/2013, given in dose dense fashion  with Neulasta support on day 2. Completed 4 cycles 07/03/2013, with the final cycle dose reduced 15% because of an episode of febrile neutropenia after cycle 3. Restaging studies 07/15/2013 showed partial response.  (6) started capecitabine April 2015, 1.5 g by mouth twice a day, 7 days on 7 days off--discontinued October 2015 with progression  (7) s/p Right simple mastectomy 02/17/2014 to optimize local control, the pathology ([id number]) showing an invasive ductal carcinoma measuring 4.8 cm, grade 3, with close but negative margins and evidence of treatment response   (a) nodule on right mastectomy scar noted 04/03/2014, removed 04/14/2014  (8) multiple brain metastases noted on brain MRI 02/26/2014--s/p whole brain irradiation completed 03/17/2014  (9) protein-calorie malnutrition  (10) high fall risk  (11) advanced directives: Not in place; husband is healthcare power of attorney  (12) possible ruptured Baker's cyst, Left, 05/01/2014    PLAN: I Don't have a good explanation for what happened last night to K. I suppose she could've ruptured a Baker's cyst, which wouldn't cause a severe acute painin that area and if that waswhat happened hopefully the problem will not recur. Just in case however I wrote her a prescription for Dilantin (a total of 120 tablets, 2 mg) so she can have that available. We talked about the possible toxicities, side effects and complications of narcotics in general and she understands even minimal use of narcotics is going to result in significant constipation. Accordingly she should  go back to mineral ask in addition to stool softeners. She has nausea she will use half a Compazine tablets, since the full 10 mg appeared to be a little bit much for her.  We then moved onto where are we going now. She is already scheduled for repeat MRI of the brain 05/13/2014. If that is negative then we can move to systemic treatment, which might well be Doxil, although they are also interested in possibly participating in a study at [facility]. If there is measurable disease in the brain then the question is whether she would qualify for a brain protocol through [facility] or should proceed to stereotactic radiosurgery here.   At any rate at this point it is clear that [facility] is not hospice appropriate, as her condition has actually improvedand she is interested in further life prolonging treatments.  Malina has a good understanding of the overall plan. She agrees with it. They will call with any problems that may develop before her next visit here, Which will be the first week in January.  Chauncey Cruel, MD   05/01/2014 3:40 PM

## 2014-05-01 NOTE — Telephone Encounter (Signed)
RX already refilled on 04-29-14 with deacdron #30 with 1 RF.  Called [facility] and confirmed.  They will contact patient and let her know.

## 2014-05-01 NOTE — Telephone Encounter (Signed)
This RN spoke with pt's husband - Carrie Berry- per his call stating need for follow up post visit to the ER early this am.  Reviewed ER noted.  Per Carrie Berry- pt is "trying to rest but still having pain ".  She was d/c with diluadid and has taken 1/2 tablet at present.  Per Carrie Berry pain in in legs, knees and ankles. She is continent of stool and urine " and up until last night was doing fine walking around ". " if not for the pain she would be ok ".  Above reviewed with MD and this RN spoke with Carrie Berry again.  This RN informed Carrie Berry of possible causes of pain with concern regarding increased cancer growth and need for greater management.  Carrie Berry states hospice came out to the home and " we were denied services because she is going to have an MRI ".  Plan per call is Carrie Berry with bring Carrie Berry into the office at 2pm for visit with Dr Jannifer Rodney.

## 2014-05-01 NOTE — ED Notes (Signed)
Pt comes from home with hx of stage 4 breast cancer. Pt states she woke up with intense knee and ankle pain. Pt reports knee pain is worst than the ankle pain. Pt took Tramadol with no relief. Pt states it feels like the pain is deep and sharp. Pt is alert and oriented.

## 2014-05-01 NOTE — ED Provider Notes (Signed)
CSN: [id number]     Arrival date & time 05/01/14  0225 History   First MD Initiated Contact with Patient 05/01/14 0325     Chief Complaint  Patient presents with  . Knee Pain  . Ankle Pain     (Consider location/radiation/quality/duration/timing/severity/associated sxs/prior Treatment) HPI Comments: The patient is a 63 year old female with past mental history of metastatic breast cancer presenting to the emergency department chief complaint of abrupt onset of left ankle and knee pain since tonight. Reports discomfort as "deep", not reproducible or worsened with position, pressure, movement. Reports taking 2 tramadol prior to arrival without resolution of symptoms. Patient denies recent injury or fall. Patient's spouse reports increase in activity over the last several days, stated as walking and going out to dinner with her daughter. Reports she had bilateral hip and pelvis pain yesterday prior to onset of lower extremity pain. Patient denies calf pain or extremity edema. Patient denies fever, chills.  The history is provided by the patient and the spouse. No language interpreter was used.    Past Medical History  Diagnosis Date  . Recurrent sinus infections   . Arthritis   . PONV (postoperative nausea and vomiting)   . GERD (gastroesophageal reflux disease)   . 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 Anxiety     no problems at present  . rt breast ca dx'd 12/2011    breast  . Breast cancer   . 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 Tubal ligation    . Tubal ligation  1985  . Portacath placement N/A 01/10/2013    Procedure: ULTRASOUND GUIDED PORT-A-CATH INSERTION WITH FLUOROSCOPY;  Surgeon: [name], MD;  Location: [facility];  Service: General;  Laterality: N/A;  . Esophagogastroduodenoscopy N/A 08/19/2013    Procedure: ESOPHAGOGASTRODUODENOSCOPY (EGD);  Surgeon: Garlan Fair,  MD;  Location: [facility];  Service: Endoscopy;  Laterality: N/A;  . Total mastectomy Right 02/17/2014    Procedure: PALLIATIVE  RIGHT MASTECTOMY;  Surgeon: [name], MD;  Location: [facility];  Service: General;  Laterality: Right;   Family History  Problem Relation Age of Onset  . Bladder Cancer Father   . Colon cancer Maternal Grandmother    History  Substance Use Topics  . Smoking status: Never Smoker   . Smokeless tobacco: Never Used  . Alcohol Use: No   OB History    No data available     Review of Systems  Constitutional: Negative for fever and chills.  Gastrointestinal: Negative for nausea and vomiting.  Musculoskeletal: Positive for arthralgias. Negative for myalgias.  Skin: Negative for color change and wound.  Neurological: Negative for numbness.      Allergies  Ondansetron hcl; Codeine; and Penicillins  Home Medications   Prior to Admission medications   Medication Sig Start Date End Date Taking? Authorizing Provider  dexamethasone (DECADRON) 4 MG tablet Take 8 mg by mouth daily.   Yes Historical Provider, MD  docusate sodium 100 MG CAPS Take 100 mg by mouth daily. 03/10/14  Yes Robbie Lis, MD  levETIRAcetam (KEPPRA) 1000 MG tablet Take 1,000 mg by mouth every 12 (twelve) hours. 03/24/14  Yes Historical Provider, MD  methylphenidate (RITALIN) 5 MG tablet Take 1 tablet (5 mg total) by mouth 2 (two) times daily with breakfast and lunch. 04/14/14  Yes Chauncey Cruel, MD  mirtazapine (REMERON) 30 MG tablet Take 1 tablet (30 mg  total) by mouth at bedtime. 03/21/14  Yes Bonnielee Haff, MD  Olopatadine HCl (PATADAY) 0.2 % SOLN Place 1 drop into both eyes daily as needed (dry eyes). 03/21/14  Yes Bonnielee Haff, MD  omeprazole (PRILOSEC) 20 MG capsule Take 20 mg by mouth 2 (two) times daily before a meal.   Yes Historical Provider, MD  traMADol (ULTRAM) 50 MG tablet Take 50 mg by mouth every 6 (six) hours as needed for moderate pain.  03/10/14  Yes Historical  Provider, MD  traZODone (DESYREL) 50 MG tablet Take 2 tablets (100 mg total) by mouth at bedtime. 03/21/14  Yes Bonnielee Haff, MD  triamcinolone (NASACORT) 55 MCG/ACT nasal inhaler Place 2 sprays into the nose daily.   Yes Historical Provider, MD  dexamethasone (DECADRON) 4 MG tablet 2 TABLETS ONCE A DAY ON DAY AFTER CHEMOTHERAPY,THEN TAKE 2 TABLETS DAILY FOR 2 DAYS. TAKE WITH FOOD Patient not taking: Reported on 05/01/2014 04/29/14   Chauncey Cruel, MD  fluconazole (DIFLUCAN) 100 MG tablet Take 1 tablet (100 mg total) by mouth daily. Patient not taking: Reported on 05/01/2014 04/04/14   Chauncey Cruel, MD  omeprazole (PRILOSEC) 20 MG capsule TAKE 1 CAPSULE (20 MG TOTAL) BY MOUTH 2 (TWO) TIMES DAILY BEFORE A MEAL. Patient not taking: Reported on 05/01/2014 04/13/14   Chauncey Cruel, MD  ondansetron (ZOFRAN) 4 MG tablet Take 1 tablet (4 mg total) by mouth every 8 (eight) hours as needed for nausea or vomiting. Patient not taking: Reported on 05/01/2014 03/21/14   Bonnielee Haff, MD  pantoprazole (PROTONIX) 40 MG tablet Take 1 tablet (40 mg total) by mouth daily. Patient not taking: Reported on 04/13/2014 03/21/14   Bonnielee Haff, MD  polyethylene glycol (MIRALAX / GLYCOLAX) packet Take 17 g by mouth daily. Patient not taking: Reported on 05/01/2014 03/21/14   Bonnielee Haff, MD  prochlorperazine (COMPAZINE) 5 MG tablet Take 1 tablet (5 mg total) by mouth 4 (four) times daily -  before meals and at bedtime. Patient not taking: Reported on 05/01/2014 03/21/14   Bonnielee Haff, MD  tobramycin-dexamethasone [facility]) ophthalmic solution Place 1 drop into both eyes every 6 (six) hours as needed (dry eyes).  03/10/14   Historical Provider, MD   BP 106/61 mmHg  Pulse 83  Temp(Src) 97.8 F (36.6 C)  Resp 18  SpO2 98% Physical Exam  Constitutional: She is oriented to person, place, and time. She appears well-developed and well-nourished.  Non-toxic appearance. She does not have a sickly  appearance. She does not appear ill. No distress.  HENT:  Head: Normocephalic and atraumatic.  Neck: Neck supple.  Cardiovascular: Normal rate and regular rhythm.   Pulses:      Dorsalis pedis pulses are 1+ on the right side, and 2+ on the left side.  No lower extremity edema, negative Homans, no tenderness with palpation of calf.  Pulmonary/Chest: Effort normal. No respiratory distress. She has no wheezes. She has no rales.  Port-A-Cath right upper chest wall, nontender erythema.  Abdominal: Soft. There is no tenderness. There is no rebound.  Musculoskeletal:       Left knee: She exhibits normal range of motion and no swelling. No tenderness found.       Left ankle: She exhibits normal range of motion and no swelling. No tenderness.  Normal sensation, unable to reproduce discomfort with palpation or movement.  Neurological: She is alert and oriented to person, place, and time.  Skin: Skin is warm and dry. She is not diaphoretic.  Psychiatric:  She has a normal mood and affect. Her behavior is normal.  Nursing note and vitals reviewed.   ED Course  Procedures (including critical care time) Labs Review Labs Reviewed - No data to display  Imaging Review Dg Pelvis 1-2 Views  05/01/2014   CLINICAL DATA:  Bilateral hip pain, no trauma. History of breast cancer.  EXAM: PELVIS - 1-2 VIEW  COMPARISON:  Abdominal radiograph March 08, 2014  FINDINGS: There is no evidence of pelvic fracture or diastasis. No pelvic bone lesions are seen. Partially imaged moderate amount of retained large bowel stool.  IMPRESSION: No acute osseous process.  Partially imaged moderate amount of retained large bowel stool.   Electronically Signed   By: Elon Alas   On: 05/01/2014 05:32   Dg Ankle Complete Left  05/01/2014   CLINICAL DATA:  Ankle pain.  History of stage IV breast cancer.  EXAM: LEFT ANKLE COMPLETE - 3+ VIEW  COMPARISON:  None.  FINDINGS: There is no evidence of fracture, dislocation, or joint  effusion. There is no evidence of arthropathy or other focal bone abnormality. Soft tissues are unremarkable.  IMPRESSION: Negative.   Electronically Signed   By: Jorje Guild M.D.   On: 05/01/2014 04:09   Dg Knee Complete 4 Views Left  05/01/2014   CLINICAL DATA:  Knee pain without injury. Stage IV metastatic breast cancer.  EXAM: LEFT KNEE - COMPLETE 4+ VIEW  COMPARISON:  None.  FINDINGS: There is no evidence of fracture, dislocation, or joint effusion. There is no evidence of arthropathy or other focal bone abnormality. Soft tissues are unremarkable.  IMPRESSION: Negative. If there is ongoing unexplained knee pain, bone scan could evaluate for occult metastasis.   Electronically Signed   By: Jorje Guild M.D.   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 No sign of infection, decrease in sensation. X-rays negative for sign of metastatic disease.  [phone] patient resting comfortably in room reports moderate relief of symptoms with pain medication. Discussed negative x-ray results with the patient patient's husband. Patient reporting right lower extremity discomfort. Patient's husband requesting pelvis x-ray. Reevaluation patient reports discomfort 5 out of 10, plan to give another dose of medication, discharge follow-up with oncologist later today.  Harvie Heck, PA-C 05/01/14 0177  Everlene Balls, MD 05/01/14 [phone]

## 2014-05-01 NOTE — Discharge Instructions (Signed)
Call your oncologist today for further evaluation of your symptoms. Call for a follow up appointment with a Family or Primary Care Provider.  Return if Symptoms worsen.   Take medication as prescribed.

## 2014-05-01 NOTE — ED Notes (Signed)
Bed: XB84 Expected date:  Expected time:  Means of arrival:  Comments: EMS 63yo F Breast Ca, c/o left knee and ankle pain; no relief from Tramadol

## 2014-05-04 ENCOUNTER — Encounter: Payer: Self-pay | Admitting: Podiatry

## 2014-05-04 ENCOUNTER — Ambulatory Visit (INDEPENDENT_AMBULATORY_CARE_PROVIDER_SITE_OTHER): Payer: BC Managed Care – PPO | Admitting: Podiatry

## 2014-05-04 VITALS — BP 113/65 | HR 94 | Resp 11 | Ht 63.0 in | Wt 109.0 lb

## 2014-05-04 DIAGNOSIS — M79675 Pain in left toe(s): Secondary | ICD-10-CM

## 2014-05-04 DIAGNOSIS — B351 Tinea unguium: Secondary | ICD-10-CM

## 2014-05-04 NOTE — Patient Instructions (Signed)
Apply Vaseline and Band-Aid to the left nail bed daily until comfortable

## 2014-05-04 NOTE — Progress Notes (Signed)
   Subjective:    Patient ID: Carrie Berry, female    DOB: 1951-01-15, 63 y.o.   MRN: [id number]  HPI Comments: N toenail problems L left 1st toenail D and O fall of 2015 C discolored and tender A chemotherapy and stage 4 breast cancer T none  Patient complaining of a painful left hallux toenail denies without professional treatment Patient is undergone chemotherapy and radiation therapy causing some dermatological change in the extremities  Review of Systems  Neurological: Positive for weakness.  All other systems reviewed and are negative.      Objective:   Physical Exam  Orientated 3 patient presents with her husband  Vascular: DP and PT pulses 2/4 bilaterally  Neurological: Sensation to 10 g monofilament wire intact 5/5 bilaterally Vibratory sensation intact bilaterally Ankle reflex equal and reactive bilaterally  Dermatological: Dry skin bilaterally Linear areas of erythema symmetrical in the dorsal first webspace proximally (associated with chemotherapy)  The left hallux toenail is layered, has dried blood, mildly hypertrophic, brittle and incurvated. There is no erythema, edema, drainage surrounding the left hallux nail. Absent fifth right toenail  Musculoskeletal: Patient walks with assistance of roller walker HAV deformity left Tailor's bunions bilaterally         Assessment & Plan:   Assessment: Satisfactory vascular status Protective sensation intact Unstable gait pattern Symptomatic onychomycoses left hallux  Plan: Debride left hallux nail without any bleeding. The underlying nailbed demonstrates no erythema, drainage.  Patient advised to apply Vaseline and a Band-Aid left hallux nail bed until all sensitivity is resolved.  Reappoint at patient's request

## 2014-05-12 ENCOUNTER — Ambulatory Visit (HOSPITAL_COMMUNITY)
Admission: RE | Admit: 2014-05-12 | Discharge: 2014-05-12 | Disposition: A | Discharge status: Home / Self Care | Payer: BC Managed Care – PPO | Source: Ambulatory Visit | Attending: Oncology | Admitting: Oncology

## 2014-05-12 DIAGNOSIS — Z923 Personal history of irradiation: Secondary | ICD-10-CM | POA: Diagnosis not present

## 2014-05-12 DIAGNOSIS — C7931 Secondary malignant neoplasm of brain: Secondary | ICD-10-CM | POA: Insufficient documentation

## 2014-05-12 DIAGNOSIS — C50919 Malignant neoplasm of unspecified site of unspecified female breast: Secondary | ICD-10-CM | POA: Diagnosis present

## 2014-05-12 MED ORDER — GADOBENATE DIMEGLUMINE 529 MG/ML IV SOLN
10.0000 mL | Freq: Once | INTRAVENOUS | Status: AC | PRN
  Administered 2014-05-12: 10 mL via INTRAVENOUS

## 2014-05-19 ENCOUNTER — Ambulatory Visit (HOSPITAL_BASED_OUTPATIENT_CLINIC_OR_DEPARTMENT_OTHER): Payer: BC Managed Care – PPO | Admitting: Oncology

## 2014-05-19 ENCOUNTER — Other Ambulatory Visit (HOSPITAL_BASED_OUTPATIENT_CLINIC_OR_DEPARTMENT_OTHER): Payer: BLUE CROSS/BLUE SHIELD

## 2014-05-19 VITALS — BP 103/59 | HR 96 | Temp 97.7°F | Resp 18

## 2014-05-19 DIAGNOSIS — K5900 Constipation, unspecified: Secondary | ICD-10-CM

## 2014-05-19 DIAGNOSIS — C7800 Secondary malignant neoplasm of unspecified lung: Secondary | ICD-10-CM

## 2014-05-19 DIAGNOSIS — C787 Secondary malignant neoplasm of liver and intrahepatic bile duct: Secondary | ICD-10-CM

## 2014-05-19 DIAGNOSIS — C7931 Secondary malignant neoplasm of brain: Secondary | ICD-10-CM

## 2014-05-19 DIAGNOSIS — D0590 Unspecified type of carcinoma in situ of unspecified breast: Secondary | ICD-10-CM

## 2014-05-19 DIAGNOSIS — C50411 Malignant neoplasm of upper-outer quadrant of right female breast: Secondary | ICD-10-CM

## 2014-05-19 DIAGNOSIS — R627 Adult failure to thrive: Secondary | ICD-10-CM

## 2014-05-19 DIAGNOSIS — E43 Unspecified severe protein-calorie malnutrition: Secondary | ICD-10-CM

## 2014-05-19 DIAGNOSIS — C50911 Malignant neoplasm of unspecified site of right female breast: Secondary | ICD-10-CM

## 2014-05-19 DIAGNOSIS — R1110 Vomiting, unspecified: Secondary | ICD-10-CM

## 2014-05-19 DIAGNOSIS — C7801 Secondary malignant neoplasm of right lung: Secondary | ICD-10-CM

## 2014-05-19 LAB — CBC WITH DIFFERENTIAL/PLATELET
BASO%: 0.4 % (ref 0.0–2.0)
Basophils Absolute: 0 10*3/uL (ref 0.0–0.1)
EOS ABS: 0.1 10*3/uL (ref 0.0–0.5)
EOS%: 0.6 % (ref 0.0–7.0)
HCT: 39.2 % (ref 34.8–46.6)
HGB: 13.2 g/dL (ref 11.6–15.9)
LYMPH%: 5.5 % — AB (ref 14.0–49.7)
MCH: 32.6 pg (ref 25.1–34.0)
MCHC: 33.7 g/dL (ref 31.5–36.0)
MCV: 96.8 fL (ref 79.5–101.0)
MONO#: 0.6 10*3/uL (ref 0.1–0.9)
MONO%: 5.2 % (ref 0.0–14.0)
NEUT#: 10.5 10*3/uL — ABNORMAL HIGH (ref 1.5–6.5)
NEUT%: 88.3 % — ABNORMAL HIGH (ref 38.4–76.8)
PLATELETS: 181 10*3/uL (ref 145–400)
RBC: 4.05 10*6/uL (ref 3.70–5.45)
RDW: 15.7 % — ABNORMAL HIGH (ref 11.2–14.5)
WBC: 11.9 10*3/uL — ABNORMAL HIGH (ref 3.9–10.3)
lymph#: 0.7 10*3/uL — ABNORMAL LOW (ref 0.9–3.3)

## 2014-05-19 LAB — COMPREHENSIVE METABOLIC PANEL (CC13)
ALBUMIN: 2.9 g/dL — AB (ref 3.5–5.0)
ALT: 32 U/L (ref 0–55)
AST: 24 U/L (ref 5–34)
Alkaline Phosphatase: 77 U/L (ref 40–150)
Anion Gap: 9 mEq/L (ref 3–11)
BUN: 23.4 mg/dL (ref 7.0–26.0)
CO2: 25 meq/L (ref 22–29)
Calcium: 8.2 mg/dL — ABNORMAL LOW (ref 8.4–10.4)
Chloride: 101 mEq/L (ref 98–109)
Creatinine: 0.6 mg/dL (ref 0.6–1.1)
EGFR: >90 mL/min/{1.73_m2} (ref >90)
Glucose: 102 mg/dl (ref 70–140)
Potassium: 4.4 mEq/L (ref 3.5–5.1)
SODIUM: 134 meq/L — AB (ref 136–145)
Total Bilirubin: 0.46 mg/dL (ref 0.20–1.20)
Total Protein: 5.7 g/dL — ABNORMAL LOW (ref 6.4–8.3)

## 2014-05-19 LAB — TECHNOLOGIST REVIEW

## 2014-05-19 MED ORDER — HYDROMORPHONE HCL 4 MG PO TABS
4.0000 mg | ORAL_TABLET | Freq: Once | ORAL | Status: DC
  Filled 2014-05-19: qty 1

## 2014-05-19 MED ORDER — SODIUM CHLORIDE 0.9 % IJ SOLN
10.0000 mL | INTRAMUSCULAR | Status: DC | PRN
  Administered 2014-05-19: 10 mL via INTRAVENOUS
  Filled 2014-05-19: qty 10

## 2014-05-19 MED ORDER — PROCHLORPERAZINE MALEATE 10 MG PO TABS: 10.0000 mg | ORAL_TABLET | Freq: Four times a day (QID) | ORAL | Status: DC | PRN

## 2014-05-19 MED ORDER — HEPARIN SOD (PORK) LOCK FLUSH 100 UNIT/ML IV SOLN
500.0000 [IU] | Freq: Once | INTRAVENOUS | Status: AC
  Administered 2014-05-19: 500 [IU] via INTRAVENOUS
  Filled 2014-05-19: qty 5

## 2014-05-19 MED ORDER — LEVETIRACETAM 1000 MG PO TABS: 1000.0000 mg | ORAL_TABLET | Freq: Two times a day (BID) | ORAL | Status: DC

## 2014-05-19 MED ORDER — MIRTAZAPINE 30 MG PO TABS
30.0000 mg | ORAL_TABLET | Freq: Every day | ORAL | Status: DC
Start: 2014-05-19 — End: 2014-08-20

## 2014-05-19 MED ORDER — HYDROMORPHONE HCL 4 MG/ML IJ SOLN
1.0000 mg | Freq: Once | INTRAMUSCULAR | Status: AC
  Administered 2014-05-19: 1 mg via INTRAVENOUS

## 2014-05-19 MED ORDER — LIDOCAINE-PRILOCAINE 2.5-2.5 % EX CREA: TOPICAL_CREAM | CUTANEOUS | Status: DC

## 2014-05-19 MED ORDER — HYDROMORPHONE HCL 4 MG/ML IJ SOLN
INTRAMUSCULAR | Status: AC
  Filled 2014-05-19: qty 1

## 2014-05-19 MED ORDER — SODIUM CHLORIDE 0.9 % IV SOLN
Freq: Once | INTRAVENOUS | Status: AC
  Administered 2014-05-19: 15:00:00 via INTRAVENOUS

## 2014-05-19 MED ORDER — ONDANSETRON HCL 8 MG PO TABS: 8.0000 mg | ORAL_TABLET | Freq: Two times a day (BID) | ORAL | Status: DC

## 2014-05-19 MED ORDER — DEXAMETHASONE 2 MG PO TABS
ORAL_TABLET | ORAL | Status: DC
Start: 2014-05-19 — End: 2014-06-24

## 2014-05-19 MED ORDER — METHYLPHENIDATE HCL 5 MG PO TABS: 5.0000 mg | ORAL_TABLET | Freq: Two times a day (BID) | ORAL | Status: DC

## 2014-05-19 NOTE — Patient Instructions (Signed)
Dehydration, Adult Dehydration is when you lose more fluids from the body than you take in. Vital organs like the kidneys, brain, and heart cannot function without a proper amount of fluids and salt. Any loss of fluids from the body can cause dehydration.  CAUSES   Vomiting.  Diarrhea.  Excessive sweating.  Excessive urine output.  Fever. SYMPTOMS  Mild dehydration  Thirst.  Dry lips.  Slightly dry mouth. Moderate dehydration  Very dry mouth.  Sunken eyes.  Skin does not bounce back quickly when lightly pinched and released.  Dark urine and decreased urine production.  Decreased tear production.  Headache. Severe dehydration  Very dry mouth.  Extreme thirst.  Rapid, weak pulse (more than 100 beats per minute at rest).  Cold hands and feet.  Not able to sweat in spite of heat and temperature.  Rapid breathing.  Blue lips.  Confusion and lethargy.  Difficulty being awakened.  Minimal urine production.  No tears. DIAGNOSIS  Your caregiver will diagnose dehydration based on your symptoms and your exam. Blood and urine tests will help confirm the diagnosis. The diagnostic evaluation should also identify the cause of dehydration. TREATMENT  Treatment of mild or moderate dehydration can often be done at home by increasing the amount of fluids that you drink. It is best to drink small amounts of fluid more often. Drinking too much at one time can make vomiting worse. Refer to the home care instructions below. Severe dehydration needs to be treated at the hospital where you will probably be given intravenous (IV) fluids that contain water and electrolytes. HOME CARE INSTRUCTIONS   Ask your caregiver about specific rehydration instructions.  Drink enough fluids to keep your urine clear or pale yellow.  Drink small amounts frequently if you have nausea and vomiting.  Eat as you normally do.  Avoid:  Foods or drinks high in sugar.  Carbonated  drinks.  Juice.  Extremely hot or cold fluids.  Drinks with caffeine.  Fatty, greasy foods.  Alcohol.  Tobacco.  Overeating.  Gelatin desserts.  Wash your hands well to avoid spreading bacteria and viruses.  Only take over-the-counter or prescription medicines for pain, discomfort, or fever as directed by your caregiver.  Ask your caregiver if you should continue all prescribed and over-the-counter medicines.  Keep all follow-up appointments with your caregiver. SEEK MEDICAL CARE IF:  You have abdominal pain and it increases or stays in one area (localizes).  You have a rash, stiff neck, or severe headache.  You are irritable, sleepy, or difficult to awaken.  You are weak, dizzy, or extremely thirsty. SEEK IMMEDIATE MEDICAL CARE IF:   You are unable to keep fluids down or you get worse despite treatment.  You have frequent episodes of vomiting or diarrhea.  You have blood or green matter (bile) in your vomit.  You have blood in your stool or your stool looks black and tarry.  You have not urinated in 6 to 8 hours, or you have only urinated a small amount of very dark urine.  You have a fever.  You faint. MAKE SURE YOU:   Understand these instructions.  Will watch your condition.  Will get help right away if you are not doing well or get worse. Document Released: 05/01/2005 Document Revised: 07/24/2011 Document Reviewed: 12/19/2010 [employer] Patient Information 2015 [employer]. This information is not intended to replace advice given to you by your health care provider. Make sure you discuss any questions you have with your health care   provider.  

## 2014-05-19 NOTE — Progress Notes (Signed)
. Patient ID: Carrie Berry, female   DOB: [birth year], 64 y.o.   MRN: [id number] ID: [name] OB: 1950/10/22  MR#: [id number]  CSN#:[id number]  PCP: [name], MD GYN:   SU: [name] OTHER MD: Thea Silversmith, Earle Gell, Christene Slates, Heath Gold  CHIEF COMPLAINT:  Metastatic Breast Cancer CURRENT TREATMENT: Awaiting radiation for local control  BREAST CANCER HISTORY: From the originally intake note:  Lavina noted a mass in her right breast December of 2013, but did not think much of it. It did grow some lower the summer, but she was keeping her grandchild at that time and was too busy so she did not bring it to Dr. Bernell List attention until August. He set her up for bilateral mammography and ultrasonography at [facility] 12/18/2012 and this measured, on the right, a large irregular lobulated mass in the upper outer quadrant which by ultrasound measured 5.7 cm. The right axilla showed some lymph nodes with thickened cortices. In the left breast there was a focal asymmetry at the depth, lateral to the nipple, and by ultrasound there was a 9 mm ill-defined hypoechoic lesion in this location. Biopsy of the left breast lesion showed only fibrocystic changes.  Biopsy of the right breast mass and right axillary adenopathy ([id number]) on 12/23/2012 showed both to be involved by invasive ductal carcinoma, grade 2, triple negative, with an MIB-1 of 86%.  MRI of the breast 12/27/2012 at [facility] imaging showed in the right breast a mass abutting the pectoralis muscle without enhancement of the muscle measuring 4.8 cm. The satellite nodule measuring approximately 3 mm was also noted superior to the mass and several abnormal and enlarged right axillary lymph nodes were noted, one of which appeared to be necrotic. The largest node measured 2.0 cm. Unfortunately, numerous bilateral pulmonary nodules were also noted.  The patient's subsequent history is as detailed below    INTERVAL HISTORY: Hien returns today for follow-up of her metastatic breast cancer accompanied by her husband [name]. Shortly before this visit she had a repeat brain MRI which shows significant improvement from baseline, with no new lesions and much less edema. The patient has now ready to discuss additional therapy for her peripheral disease.  REVIEW OF SYSTEMS Tahj has been having pain in her knees which can be excruciating. This is very paroxysmal. It happen today during the visit and we had to give her some dialogue noted to control it. Most of the time though she has no pain in her knees, even when she walks. [name] tells me her personality has changed over the last week or so, so that he thought the MRI of the brain would show progression. It could be that the knee pain is part of a complex reaction to the bad news that expected to receive. At any rate there dealing with the pain at home and they are managing to avoid constipation. They are concerned that there is a new bump right at the scar in the right chest wall not that long after Dr. Zella Richer having removed a mass from that area. She admits to anxiety and depression. She has had occasional problems with vomiting. At home she is with her caregiver most of the day and then [name] comes in the evening. Does the housework, cooking, laundry, and shopping. He has not had any time to exercise. He is gaining weight. He is really quite sure how to handle Cassadie when she becomes agitated or seems unreasonable. A detailed review of  systems was otherwise stable  PAST MEDICAL HISTORY: Past Medical History  Diagnosis Date  . Recurrent sinus infections   . Arthritis   . PONV (postoperative nausea and vomiting)   . GERD (gastroesophageal reflux disease)   . 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 Anxiety     no problems at present  . rt breast ca dx'd 12/2011    breast  . Breast cancer   . 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 Tubal ligation    . Tubal ligation  1985  . Portacath placement N/A 01/10/2013    Procedure: ULTRASOUND GUIDED PORT-A-CATH INSERTION WITH FLUOROSCOPY;  Surgeon: [name], MD;  Location: [place];  Service: General;  Laterality: N/A;  . Esophagogastroduodenoscopy N/A 08/19/2013    Procedure: ESOPHAGOGASTRODUODENOSCOPY (EGD);  Surgeon: Garlan Fair, MD;  Location: [facility];  Service: Endoscopy;  Laterality: N/A;  . Total mastectomy Right 02/17/2014    Procedure: PALLIATIVE  RIGHT MASTECTOMY;  Surgeon: [name], MD;  Location: [place];  Service: General;  Laterality: Right;    FAMILY HISTORY Family History  Problem Relation Age of Onset  . Bladder Cancer Father   . Colon cancer Maternal Grandmother    the patient's parents are living, both in their 28s. The patient's father was diagnosed with bladder cancer the age of 96. The patient's mother's mother had some type of gastrointestinal cancer. The patient had one brother, no sisters. There is no history of breast or ovarian cancer in the family other than a cousin on the father's side who was diagnosed with breast cancer apparently before the age of 36.  GYNECOLOGIC HISTORY:   (Reviewed 10/13/2013) Menarche age 13, first live birth age 10, the patient is Ione P2. She went through menopause in 2008. She did not take hormone replacement. She took birth control for approximately 22 years remotely.  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 She's currently on disability. Her husband Elfrieda Espino [name]"), is vice Development worker, international aid for [employer]. The patient's daughter [name] is a stay-at-home mom in [place], the patient's son Soul Deveney unfortunately died in an automobile accident in December of 2011. The patient has 2  grandchildren. She attends to a local [employer].    ADVANCED DIRECTIVES: Not in place   HEALTH MAINTENANCE: (Updated 10/13/2013) History  Substance Use Topics  . Smoking status: Never Smoker   . Smokeless tobacco: Never Used  . Alcohol Use: No     Colonoscopy: 2005  PAP: Not on file  Bone density: Not on file  Lipid panel:  Not on file/Dr. Polite   Allergies  Allergen Reactions  . Ondansetron Hcl Other (See Comments)    HEADACHE, patient states she still takes   . Codeine Nausea And Vomiting    "violently ill"  . Penicillins Rash    Childhood reaction -    Current Outpatient Prescriptions  Medication Sig Dispense Refill  . dexamethasone (DECADRON) 4 MG tablet 2 TABLETS ONCE A DAY ON DAY AFTER CHEMOTHERAPY,THEN TAKE 2 TABLETS DAILY FOR 2 DAYS. TAKE WITH FOOD 30 tablet 1  . dexamethasone (DECADRON) 4 MG tablet Take 8 mg by mouth daily.    Marland Kitchen docusate sodium 100 MG CAPS Take 100 mg by mouth daily. 10 capsule 0  . fluconazole (DIFLUCAN) 100 MG  tablet Take 1 tablet (100 mg total) by mouth daily. 30 tablet 4  . HYDROmorphone (DILAUDID) 2 MG tablet Take 0.5 tablets (1 mg total) by mouth every 4 (four) hours as needed. 10 tablet 0  . levETIRAcetam (KEPPRA) 1000 MG tablet Take 1,000 mg by mouth every 12 (twelve) hours.  0  . methylphenidate (RITALIN) 5 MG tablet Take 1 tablet (5 mg total) by mouth 2 (two) times daily with breakfast and lunch. 60 tablet 0  . mirtazapine (REMERON) 30 MG tablet Take 1 tablet (30 mg total) by mouth at bedtime. 30 tablet 1  . Olopatadine HCl (PATADAY) 0.2 % SOLN Place 1 drop into both eyes daily as needed (dry eyes). 1 Bottle 0  . omeprazole (PRILOSEC) 20 MG capsule TAKE 1 CAPSULE (20 MG TOTAL) BY MOUTH 2 (TWO) TIMES DAILY BEFORE A MEAL. 60 capsule 1  . omeprazole (PRILOSEC) 20 MG capsule Take 20 mg by mouth 2 (two) times daily before a meal.    . ondansetron (ZOFRAN) 4 MG tablet Take 1 tablet (4 mg total) by mouth every 8 (eight) hours as  needed for nausea or vomiting. 20 tablet 0  . pantoprazole (PROTONIX) 40 MG tablet Take 1 tablet (40 mg total) by mouth daily. 30 tablet 0  . polyethylene glycol (MIRALAX / GLYCOLAX) packet Take 17 g by mouth daily. 30 each 2  . prochlorperazine (COMPAZINE) 5 MG tablet Take 1 tablet (5 mg total) by mouth 4 (four) times daily -  before meals and at bedtime. 120 tablet 0  . tobramycin-dexamethasone (TOBRADEX) ophthalmic solution Place 1 drop into both eyes every 6 (six) hours as needed (dry eyes).     . traMADol (ULTRAM) 50 MG tablet Take 50 mg by mouth every 6 (six) hours as needed for moderate pain.     . traZODone (DESYREL) 50 MG tablet Take 2 tablets (100 mg total) by mouth at bedtime. 60 tablet 0  . triamcinolone (NASACORT) 55 MCG/ACT nasal inhaler Place 2 sprays into the nose daily.     No current facility-administered medications for this visit.    OBJECTIVE: Middle-aged white woman examined while reclining on the examination table Filed Vitals:   05/19/14 1431  BP: 92/60  Pulse: 112  Temp: 100.6 F (38.1 C)  Resp: 18     There is no weight on file to calculate BMI.    ECOG FS: 2 There were no vitals filed for this visit.  Sclerae unicteric, EOMs full Oropharynx clear and moist No cervical or supraclavicular adenopathy Lungs no rales or rhonchi Heart regular rate and rhythm Abd soft, nontender, positive bowel sounds MSK no focal spinal tenderness, no upper extremity lymphedema; no left knee effusion or erythema bilaterally Neuro: Nonfocal; well-oriented; distressed affect second narrative pain today Breasts: the nodule noted last visit adjacent to the right mastectomy scar has been removed, but there is a new one, smaller, little bit more lateral, right on top of the scar. Stitches from the recent resection are still in place The right axilla is benign.  LAB RESULTS:   Lab Results  Component Value Date   WBC 11.9* 05/19/2014   NEUTROABS 10.5* 05/19/2014   HGB 13.2  05/19/2014   HCT 39.2 05/19/2014   MCV 96.8 05/19/2014   PLT 181 05/19/2014      Chemistry      Component Value Date/Time   NA 133* 05/01/2014 1425   NA 139 03/21/2014 0504   K 4.4 05/01/2014 1425   K 3.9 03/21/2014 0504  CL 103 03/21/2014 0504   CO2 25 05/01/2014 1425   CO2 28 03/21/2014 0504   BUN 19.1 05/01/2014 1425   BUN 13 03/21/2014 0504   CREATININE 0.5* 05/01/2014 1425   CREATININE 0.49* 03/21/2014 0504      Component Value Date/Time   CALCIUM 8.7 05/01/2014 1425   CALCIUM 8.5 03/21/2014 0504   ALKPHOS 84 05/01/2014 1425   ALKPHOS 70 03/20/2014 0522   AST 24 05/01/2014 1425   AST 23 03/20/2014 0522   ALT 45 05/01/2014 1425   ALT 34 03/20/2014 0522   BILITOT 0.39 05/01/2014 1425   BILITOT 0.4 03/20/2014 0522        STUDIES: Dg Pelvis 1-2 Views  05/01/2014   CLINICAL DATA:  Bilateral hip pain, no trauma. History of breast cancer.  EXAM: PELVIS - 1-2 VIEW  COMPARISON:  Abdominal radiograph March 08, 2014  FINDINGS: There is no evidence of pelvic fracture or diastasis. No pelvic bone lesions are seen. Partially imaged moderate amount of retained large bowel stool.  IMPRESSION: No acute osseous process.  Partially imaged moderate amount of retained large bowel stool.   Electronically Signed   By: Elon Alas   On: 05/01/2014 05:32   Dg Ankle Complete Left  05/01/2014   CLINICAL DATA:  Ankle pain.  History of stage IV breast cancer.  EXAM: LEFT ANKLE COMPLETE - 3+ VIEW  COMPARISON:  None.  FINDINGS: There is no evidence of fracture, dislocation, or joint effusion. There is no evidence of arthropathy or other focal bone abnormality. Soft tissues are unremarkable.  IMPRESSION: Negative.   Electronically Signed   By: Jorje Guild M.D.   On: 05/01/2014 04:09   Mr Jeri Cos KV Contrast  05/12/2014   CLINICAL DATA:  Breast cancer with metastatic brain lesions. Interval radiation therapy.  EXAM: MRI HEAD WITHOUT AND WITH CONTRAST  TECHNIQUE: Multiplanar,  multiecho pulse sequences of the brain and surrounding structures were obtained without and with intravenous contrast.  CONTRAST:  75mL MULTIHANCE GADOBENATE DIMEGLUMINE 529 MG/ML IV SOLN  COMPARISON:  MRI brain 02/26/2014  FINDINGS: There is significant vasogenic scratch the there significant reduction in vasogenic edema surrounding multiple peripherally enhancing mass lesions.  The right cerebellar lesion has significantly decreased in size, now measuring 8 x 10 mm.  Right parietal lesion has decreased from 15 to 8 mm on image 11 of series 23.  A lesion at the posterior margin of the right lateral ventricle has decreased 2 mm.  The largest lesion on the previous study has decreased from 18 mm to 7 mm on image 29.  A posterior right frontal lobe lesion previously measuring 6.5 mm now measures 2.1 mm.  The largest high left frontal lobe lesion previously measured 5.6 mm and now measures 2 mm on image 42. Other left frontal and parietal lesions have decreased in size as well.  No new lesions or expanding lesions are evident.  Midline shift has markedly reduced, now measuring 1.7 mm.  Flow is present in the major intracranial arteries. The globes and orbits are intact.  The paranasal sinuses are clear. There is some fluid in the mastoid air cells bilaterally, left greater than right. No obstructing nasopharyngeal lesion is present. The fluid is similar to the prior study.  Postradiation changes of the marrow are noted throughout.  IMPRESSION: 1. Marked decrease and multiple peripherally enhancing lesions throughout both hemispheres and within the right cerebellum compatible with a positive response to therapy. 2. Marked decrease and surrounding vasogenic edema and  decreased midline shift, now measuring 1.7 mm. 3. No new or expanding lesions.   Electronically Signed   By: Lawrence Santiago M.D.   On: 05/12/2014 16:37   Dg Knee Complete 4 Views Left  05/01/2014   CLINICAL DATA:  Knee pain without injury. Stage IV  metastatic breast cancer.  EXAM: LEFT KNEE - COMPLETE 4+ VIEW  COMPARISON:  None.  FINDINGS: There is no evidence of fracture, dislocation, or joint effusion. There is no evidence of arthropathy or other focal bone abnormality. Soft tissues are unremarkable.  IMPRESSION: Negative. If there is ongoing unexplained knee pain, bone scan could evaluate for occult metastasis.   Electronically Signed   By: Jorje Guild M.D.   On: 05/01/2014 04:08      ASSESSMENT: 64 y.o. [name], [place] woman status post right breast upper outer quadrant and right axillary lymph node biopsy 12/23/2012 for a clinical T3 N1 M1, stage IV invasive ductal carcinoma, grade 3, triple negative, with an MIB-1 of 86%.  (1) right liver lobe biopsy 01/21/2013 confirms metastatic adenocarcinoma; scans show involvement of the liver, lungs, and likely bone.  (2) enrolled in [facility] study [id number] (docetaxel + oral gamma secretase inibitor [id number]), received one cycle starting 02/04/2013  but withdrew because of poor tolerance despite treatment interruptions and decreased dosing of the oral component  (3)  chest CT in early November 2014 was compared with studies in August 2014 and showed interval progression of metastatic breast cancer, with an enlarging primary right breast mass, enlarging right axillary lymph nodes, enlarging pulmonary nodules and new/enlarging hepatic metastases.  (4) Abraxane started 03/19/2013, given day 1 and day 8 of each 21 day cycle, stopped with the day 1 cycle 3 dose (04/29/2013) because of local progression of disease  (5) cyclophosphamide and doxorubicin started 05/20/2013, given in dose dense fashion  with Neulasta support on day 2. Completed 4 cycles 07/03/2013, with the final cycle dose reduced 15% because of an episode of febrile neutropenia after cycle 3. Restaging studies 07/15/2013 showed partial response.  (6) started capecitabine April 2015, 1.5 g by mouth twice a day, 7 days on 7 days  off--discontinued October 2015 with progression  (7) s/p Right simple mastectomy 02/17/2014 to optimize local control, the pathology ([id number]) showing an invasive ductal carcinoma measuring 4.8 cm, grade 3, with close but negative margins and evidence of treatment response   (a) nodule on right mastectomy scar noted 04/03/2014, removed 04/14/2014  (b) new nodule noted on R chest scar 05/19/2014  (c) considering radiation to R chest wall  (8) multiple brain metastases noted on brain MRI 02/26/2014--s/p whole brain irradiation completed 03/17/2014  (a) repeat brain MRI 05/12/2014 shows favorable response  (9) protein-calorie malnutrition  (10) high fall risk  (11) advanced directives: not in place; husband is healthcare power of attorney  (12) unexplained paroxysmal knee pain: possible ruptured Baker's cyst, Left, 05/01/2014  (13) doxil to start after chest-wall irradiation (target start date 06/23/2014)    PLAN: We spent well over an hour today going over Jeanee's complex situation. She has had a good response in the brain, and though there are still multiple lesions there they are shrinking and there is much less swelling. I think we can start tapering the Decadron and specifically I rewrote the prescriptions for 2 mg tablets. She will take 2 in the morning and one in the evening for a week, then 2 tablets in the morning for one week, then 1 tablet in the morning daily until she sees  me again in February.  We need to restage the peripheral disease and I have set her up for a CT of the chest and an MRI of the liver. Since we are thinking of Doxil, I am also setting her up for an echocardiogram.  The plan will be for Doxil to start February 9, given every month, and we will do 3-4 cycles before rechecking for response. By then she will have had another brain MRI as well.  [name] specifically wanted me to make sure [facility] was aware of her situation and he would like to be alerted by them if  there is a particularly favorable study they feel she might qualify for. I did send Dr. Albertina Parr a note with all the relevant information  Of more immediate concern, Mayah is already having a local recurrence on the right chest wall less than a month after the recent resection. She is going to need radiation to that area. I have discussed that with Dr. Pablo Ledger and she will be seeing Zigmund Daniel later this week with a view for palliative radiation to the chest wall for better local control.  All this is very stressful for [place]. I am not sure how best to help him at this point. It is simply a lot of work and there is not a lot of outside support. I have encouraged him to exercise but it really has not time to do it. He has already gained 10 pounds, he tells me. I have alerted our social worker to give them a call and see they can find additional support for him.  Fujiko will see me again 06/23/2014, and we will plan to begin the Doxil at that time. Of course we will be available before then if any problems develop.  Chauncey Cruel, MD   05/19/2014 2:52 PM

## 2014-05-20 ENCOUNTER — Emergency Department (HOSPITAL_COMMUNITY): Payer: BC Managed Care – PPO

## 2014-05-20 ENCOUNTER — Telehealth: Payer: Self-pay | Admitting: Oncology

## 2014-05-20 ENCOUNTER — Emergency Department (HOSPITAL_COMMUNITY)
Admission: EM | Admit: 2014-05-20 | Discharge: 2014-05-20 | Disposition: A | Discharge status: Home / Self Care | Payer: BC Managed Care – PPO | Attending: Emergency Medicine | Admitting: Emergency Medicine

## 2014-05-20 ENCOUNTER — Telehealth: Payer: Self-pay | Admitting: *Deleted

## 2014-05-20 ENCOUNTER — Other Ambulatory Visit: Payer: Self-pay

## 2014-05-20 ENCOUNTER — Encounter (HOSPITAL_COMMUNITY): Payer: Self-pay | Admitting: Emergency Medicine

## 2014-05-20 DIAGNOSIS — Z7952 Long term (current) use of systemic steroids: Secondary | ICD-10-CM | POA: Insufficient documentation

## 2014-05-20 DIAGNOSIS — Z862 Personal history of diseases of the blood and blood-forming organs and certain disorders involving the immune mechanism: Secondary | ICD-10-CM | POA: Insufficient documentation

## 2014-05-20 DIAGNOSIS — C7931 Secondary malignant neoplasm of brain: Secondary | ICD-10-CM | POA: Diagnosis not present

## 2014-05-20 DIAGNOSIS — Z79899 Other long term (current) drug therapy: Secondary | ICD-10-CM | POA: Diagnosis not present

## 2014-05-20 DIAGNOSIS — Z880 Allergy status to penicillin: Secondary | ICD-10-CM | POA: Diagnosis not present

## 2014-05-20 DIAGNOSIS — Z8709 Personal history of other diseases of the respiratory system: Secondary | ICD-10-CM | POA: Diagnosis not present

## 2014-05-20 DIAGNOSIS — N390 Urinary tract infection, site not specified: Secondary | ICD-10-CM

## 2014-05-20 DIAGNOSIS — C787 Secondary malignant neoplasm of liver and intrahepatic bile duct: Secondary | ICD-10-CM | POA: Diagnosis not present

## 2014-05-20 DIAGNOSIS — C50911 Malignant neoplasm of unspecified site of right female breast: Secondary | ICD-10-CM | POA: Diagnosis not present

## 2014-05-20 DIAGNOSIS — R509 Fever, unspecified: Secondary | ICD-10-CM | POA: Diagnosis present

## 2014-05-20 DIAGNOSIS — M1990 Unspecified osteoarthritis, unspecified site: Secondary | ICD-10-CM | POA: Diagnosis not present

## 2014-05-20 DIAGNOSIS — K219 Gastro-esophageal reflux disease without esophagitis: Secondary | ICD-10-CM | POA: Diagnosis not present

## 2014-05-20 DIAGNOSIS — R531 Weakness: Secondary | ICD-10-CM

## 2014-05-20 LAB — COMPREHENSIVE METABOLIC PANEL
ALT: 28 U/L (ref 0–35)
AST: 30 U/L (ref 0–37)
Albumin: 3 g/dL — ABNORMAL LOW (ref 3.5–5.2)
Alkaline Phosphatase: 69 U/L (ref 39–117)
Anion gap: 11 (ref 5–15)
BILIRUBIN TOTAL: 0.5 mg/dL (ref 0.3–1.2)
BUN: 15 mg/dL (ref 6–23)
CO2: 24 mmol/L (ref 19–32)
CREATININE: 0.35 mg/dL — AB (ref 0.50–1.10)
Calcium: 8.4 mg/dL (ref 8.4–10.5)
Chloride: 99 mEq/L (ref 96–112)
GFR calc Af Amer: >90 mL/min (ref >90)
GFR calc non Af Amer: >90 mL/min (ref >90)
GLUCOSE: 97 mg/dL (ref 70–99)
Potassium: 3.7 mmol/L (ref 3.5–5.1)
SODIUM: 134 mmol/L — AB (ref 135–145)
Total Protein: 5.5 g/dL — ABNORMAL LOW (ref 6.0–8.3)

## 2014-05-20 LAB — URINALYSIS, ROUTINE W REFLEX MICROSCOPIC
BILIRUBIN URINE: NEGATIVE
Glucose, UA: NEGATIVE mg/dL
Hgb urine dipstick: NEGATIVE
KETONES UR: NEGATIVE mg/dL
NITRITE: POSITIVE — AB
PROTEIN: NEGATIVE mg/dL
SPECIFIC GRAVITY, URINE: 1.019 (ref 1.005–1.030)
UROBILINOGEN UA: 0.2 mg/dL (ref 0.0–1.0)
pH: 6 (ref 5.0–8.0)

## 2014-05-20 LAB — INFLUENZA PANEL BY PCR (TYPE A & B)
H1N1FLUPCR: NOT DETECTED
INFLAPCR: NEGATIVE
Influenza B By PCR: NEGATIVE

## 2014-05-20 LAB — CBC WITH DIFFERENTIAL/PLATELET
BASOS ABS: 0 10*3/uL (ref 0.0–0.1)
Basophils Relative: 0 % (ref 0–1)
EOS ABS: 0.1 10*3/uL (ref 0.0–0.7)
EOS PCT: 1 % (ref 0–5)
HCT: 37.6 % (ref 36.0–46.0)
Hemoglobin: 12.4 g/dL (ref 12.0–15.0)
Lymphocytes Relative: 4 % — ABNORMAL LOW (ref 12–46)
Lymphs Abs: 0.4 10*3/uL — ABNORMAL LOW (ref 0.7–4.0)
MCH: 32.4 pg (ref 26.0–34.0)
MCHC: 33 g/dL (ref 30.0–36.0)
MCV: 98.2 fL (ref 78.0–100.0)
MONO ABS: 0.5 10*3/uL (ref 0.1–1.0)
Monocytes Relative: 4 % (ref 3–12)
NEUTROS PCT: 91 % — AB (ref 43–77)
Neutro Abs: 9.9 10*3/uL — ABNORMAL HIGH (ref 1.7–7.7)
Platelets: 132 10*3/uL — ABNORMAL LOW (ref 150–400)
RBC: 3.83 MIL/uL — AB (ref 3.87–5.11)
RDW: 15.3 % (ref 11.5–15.5)
WBC: 10.9 10*3/uL — AB (ref 4.0–10.5)

## 2014-05-20 LAB — I-STAT CG4 LACTIC ACID, ED: LACTIC ACID, VENOUS: 1.26 mmol/L (ref 0.5–2.2)

## 2014-05-20 LAB — URINE MICROSCOPIC-ADD ON

## 2014-05-20 MED ORDER — SODIUM CHLORIDE 0.9 % IV BOLUS (SEPSIS)
1000.0000 mL | Freq: Once | INTRAVENOUS | Status: AC
Start: 2014-05-20 — End: 2014-05-20
  Administered 2014-05-20: 1000 mL via INTRAVENOUS

## 2014-05-20 MED ORDER — DEXTROSE 5 % IV SOLN
1.0000 g | Freq: Once | INTRAVENOUS | Status: AC
  Administered 2014-05-20: 1 g via INTRAVENOUS
  Filled 2014-05-20: qty 10

## 2014-05-20 MED ORDER — CEPHALEXIN 500 MG PO CAPS: 500.0000 mg | ORAL_CAPSULE | Freq: Two times a day (BID) | ORAL | Status: DC

## 2014-05-20 MED ORDER — PROMETHAZINE HCL 25 MG/ML IJ SOLN
12.5000 mg | Freq: Once | INTRAMUSCULAR | Status: AC
  Administered 2014-05-20: 12.5 mg via INTRAVENOUS
  Filled 2014-05-20: qty 1

## 2014-05-20 MED ORDER — HEPARIN SOD (PORK) LOCK FLUSH 100 UNIT/ML IV SOLN
500.0000 [IU] | Freq: Once | INTRAVENOUS | Status: AC
  Administered 2014-05-20: 500 [IU]
  Filled 2014-05-20: qty 5

## 2014-05-20 MED ORDER — PROMETHAZINE HCL 25 MG PO TABS: 25.0000 mg | ORAL_TABLET | Freq: Four times a day (QID) | ORAL | Status: DC | PRN

## 2014-05-20 MED ORDER — HYDROMORPHONE HCL 1 MG/ML IJ SOLN
1.0000 mg | Freq: Once | INTRAMUSCULAR | Status: AC
  Administered 2014-05-20: 1 mg via INTRAVENOUS
  Filled 2014-05-20: qty 1

## 2014-05-20 NOTE — Telephone Encounter (Signed)
[name] ( pt's husband ) called stating ongoing issues this am with fever up to 101.9, nausea, continued bilateral knee pain and headache.  [name] states temp pm 1/5 was 100.4 and gave her tylenol.  This am he gave her compazine for nausea and tylenol.  [name] is calling for further instructions on " what to do ".  Per review with MD- recommendation to proceed to the ER for evaluation of fever.  This RN informed [name] of above and he will take her to the ER at [facility].  This RN called and gave report of pending arrival to charge nurse in [place].

## 2014-05-20 NOTE — ED Notes (Signed)
Pt from home states yesterday started feeling weak and nausea, pt also had fever of 101 at home. Husband gave tylenol 2 capsule at home. Pt is fatigue and sweating.

## 2014-05-20 NOTE — ED Provider Notes (Signed)
TIME SEEN: 11:15 AM  CHIEF COMPLAINT: Weak, nauseous, fever, body aches, joint pain  HPI: Pt is a 64 y.o. F with history of breast cancer his last chemotherapy was in August with metastasis to liver and brain who is received whole brain irradiation who presents to the emergency department with fever, denies weakness, nausea, myalgias, diffuse joint pain that started yesterday.  Husband reports the patient was seen by her oncologist yesterday and was noted to be mildly hypotensive. They gave her IV fluids, Dilaudid for pain. She states that she has had some mild nasal congestion but no cough. No vomiting or diarrhea. No rash. No headache, neck pain or neck stiffness. No sick contacts or recent travel.  ROS: See HPI Constitutional: no fever  Eyes: no drainage  ENT: no runny nose   Cardiovascular:  no chest pain  Resp: no SOB  GI: no vomiting GU: no dysuria Integumentary: no rash  Allergy: no hives  Musculoskeletal: no leg swelling  Neurological: no slurred speech ROS otherwise negative  PAST MEDICAL HISTORY/PAST SURGICAL HISTORY:  Past Medical History  Diagnosis Date  . Recurrent sinus infections   . Arthritis   . PONV (postoperative nausea and vomiting)   . GERD (gastroesophageal reflux disease)   . 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 Anxiety     no problems at present  . rt breast ca dx'd 12/2011    breast  . Breast cancer   . Metastasis from malignant tumor of breast 01/2013    to liver    MEDICATIONS:  Prior to Admission medications   Medication Sig Start Date End Date Taking? Authorizing Provider  dexamethasone (DECADRON) 2 MG tablet Take 2 tabs in AM and one in PM w food daily through JAN 12; on JAN 13 start 2 tabs in AM only; on JAN 20 take one tablet in AM only and continue until otherwise instructed 05/19/14   Chauncey Cruel, MD  dexamethasone (DECADRON) 4 MG tablet Take 8 mg by mouth daily.    Historical Provider, MD  docusate sodium 100 MG  CAPS Take 100 mg by mouth daily. 03/10/14   Robbie Lis, MD  fluconazole (DIFLUCAN) 100 MG tablet Take 1 tablet (100 mg total) by mouth daily. 04/04/14   Chauncey Cruel, MD  HYDROmorphone (DILAUDID) 2 MG tablet Take 0.5 tablets (1 mg total) by mouth every 4 (four) hours as needed. 05/01/14   Harvie Heck, PA-C  levETIRAcetam (KEPPRA) 1000 MG tablet Take 1 tablet (1,000 mg total) by mouth every 12 (twelve) hours. 05/19/14   Chauncey Cruel, MD  lidocaine-prilocaine (EMLA) cream Apply to affected area once 05/19/14   Chauncey Cruel, MD  methylphenidate (RITALIN) 5 MG tablet Take 1 tablet (5 mg total) by mouth 2 (two) times daily with breakfast and lunch. 05/19/14   Chauncey Cruel, MD  mirtazapine (REMERON) 30 MG tablet Take 1 tablet (30 mg total) by mouth at bedtime. 05/19/14   Chauncey Cruel, MD  Olopatadine HCl (PATADAY) 0.2 % SOLN Place 1 drop into both eyes daily as needed (dry eyes). 03/21/14   Bonnielee Haff, MD  omeprazole (PRILOSEC) 20 MG capsule TAKE 1 CAPSULE (20 MG TOTAL) BY MOUTH 2 (TWO) TIMES DAILY BEFORE A MEAL. 04/13/14   Chauncey Cruel, MD  omeprazole (PRILOSEC) 20 MG capsule Take 20 mg by mouth 2 (two) times daily before a meal.    Historical Provider, MD  ondansetron (ZOFRAN) 4 MG tablet Take 1 tablet (4 mg total) by  mouth every 8 (eight) hours as needed for nausea or vomiting. 03/21/14   Bonnielee Haff, MD  ondansetron (ZOFRAN) 8 MG tablet Take 1 tablet (8 mg total) by mouth 2 (two) times daily. Start the day after chemo for 2 days. Then take as needed for nausea or vomiting. 05/19/14   Chauncey Cruel, MD  pantoprazole (PROTONIX) 40 MG tablet Take 1 tablet (40 mg total) by mouth daily. 03/21/14   Bonnielee Haff, MD  polyethylene glycol (MIRALAX / GLYCOLAX) packet Take 17 g by mouth daily. 03/21/14   Bonnielee Haff, MD  prochlorperazine (COMPAZINE) 10 MG tablet Take 1 tablet (10 mg total) by mouth every 6 (six) hours as needed (Nausea or vomiting). 05/19/14   Chauncey Cruel,  MD  prochlorperazine (COMPAZINE) 5 MG tablet Take 1 tablet (5 mg total) by mouth 4 (four) times daily -  before meals and at bedtime. 03/21/14   Bonnielee Haff, MD  tobramycin-dexamethasone [facility]) ophthalmic solution Place 1 drop into both eyes every 6 (six) hours as needed (dry eyes).  03/10/14   Historical Provider, MD  traMADol (ULTRAM) 50 MG tablet Take 50 mg by mouth every 6 (six) hours as needed for moderate pain.  03/10/14   Historical Provider, MD  traZODone (DESYREL) 50 MG tablet Take 2 tablets (100 mg total) by mouth at bedtime. 03/21/14   Bonnielee Haff, MD  triamcinolone (NASACORT) 55 MCG/ACT nasal inhaler Place 2 sprays into the nose daily.    Historical Provider, MD    ALLERGIES:  Allergies  Allergen Reactions  . Ondansetron Hcl Other (See Comments)    HEADACHE, patient states she still takes   . Codeine Nausea And Vomiting    "violently ill"  . Penicillins Rash    Childhood reaction -    SOCIAL HISTORY:  History  Substance Use Topics  . Smoking status: Never Smoker   . Smokeless tobacco: Never Used  . Alcohol Use: No    FAMILY HISTORY: Family History  Problem Relation Age of Onset  . Bladder Cancer Father   . Colon cancer Maternal Grandmother     EXAM: BP 133/72 mmHg  Pulse 101  Temp(Src) 97.9 F (36.6 C) (Oral)  SpO2 93% CONSTITUTIONAL: Alert and oriented and responds appropriately to questions. Chronically ill-appearing, appears uncomfortable but nontoxic HEAD: Normocephalic EYES: Conjunctivae clear, PERRL ENT: normal nose; no rhinorrhea; dry mucous membranes; pharynx without lesions noted NECK: Supple, no meningismus, no LAD  CARD: RRR; S1 and S2 appreciated; no murmurs, no clicks, no rubs, no gallops RESP: Normal chest excursion without splinting or tachypnea; breath sounds clear and equal bilaterally; no wheezes, no rhonchi, no rales, no hypoxia ABD/GI: Normal bowel sounds; non-distended; soft, non-tender, no rebound, no guarding BACK:  The back  appears normal and is non-tender to palpation, there is no CVA tenderness EXT: Normal ROM in all joints; non-tender to palpation; no edema; normal capillary refill; no cyanosis; no calf tenderness or swelling, no joint effusions, no erythema or warmth or induration    SKIN: Normal color for age and race; warm NEURO: Moves all extremities equally PSYCH: The patient's mood and manner are appropriate. Grooming and personal hygiene are appropriate.  MEDICAL DECISION MAKING: Patient here with fever, myalgias, joint pain. Has over she's had this joint pain in the past. No signs of septic arthritis on exam. Lungs are clear, abdomen soft and nontender to palpation, no meningismus. We'll give IV fluids, Dilaudid for pain. We'll obtain labs, cultures, chest x-ray, urine, flu swab. She is hemodynamically stable.  ED PROGRESS: 3:00 PM  Patient has a nitrite-positive UTI. Culture pending. Has received Rocephin in the ED. Her labs show mild leukocytosis with left shift but otherwise unremarkable. Chest x-ray clear. She reports feeling much better and is hemodynamically stable. We'll discharge home with Keflex. She has Dilaudid at home for pain. Tolerating by mouth without difficulty. Patient's flu swab is still pending the family reports he would like to be discharged home prior to this result. Suspect that her UTIs the cause of her fever and symptoms.      EKG Interpretation  Date/Time:  Wednesday May 20 2014 11:07:15 EST Ventricular Rate:  96 PR Interval:  155 QRS Duration: 78 QT Interval:  371 QTC Calculation: 469 R Axis:   49 Text Interpretation:  Sinus rhythm Nonspecific T abnormalities, anterior leads Confirmed by Rhen Kawecki,  DO, Maxmillian Carsey ([id number]) on 05/20/2014 11:39:30 AM        [place], DO 05/20/14 1455

## 2014-05-20 NOTE — Discharge Instructions (Signed)

## 2014-05-20 NOTE — Telephone Encounter (Signed)
per pof to sch appts-sch-per Santiago Glad with rad on she has called pt & left message for appt on 1/7-adv I would note acc

## 2014-05-20 NOTE — Progress Notes (Signed)
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   Receptor Status: ER(-), PR (-), Her2-neu (-)  Did patient present with symptoms: August 2014-dx with breast cancer  Past/Anticipated interventions by surgeon, if any:  Past/Anticipated interventions by medical oncology, if any: Chemotherapy:ASSESSMENT: 64 y.o. [name], [place] woman status post right breast upper outer quadrant and right axillary lymph node biopsy 12/23/2012 for a clinical T3 N1 M1, stage IV invasive ductal carcinoma, grade 3, triple negative, with an MIB-1 of 86%.  (1) right liver lobe biopsy 01/21/2013 confirms metastatic adenocarcinoma; scans show involvement of the liver, lungs, and likely bone.  (2) enrolled in [facility] study [id number] (docetaxel + oral gamma secretase inibitor [id number]), received one cycle starting 02/04/2013 but withdrew because of poor tolerance despite treatment interruptions and decreased dosing of the oral component  (3) chest CT in early November 2014 was compared with studies in August 2014 and showed interval progression of metastatic breast cancer, with an enlarging primary right breast mass, enlarging right axillary lymph nodes, enlarging pulmonary nodules and new/enlarging hepatic metastases.  (4) Abraxane started 03/19/2013, given day 1 and day 8 of each 21 day cycle, stopped with the day 1 cycle 3 dose (04/29/2013) because of local progression of disease  (5) cyclophosphamide and doxorubicin started 05/20/2013, given in dose dense fashion with Neulasta support on day 2. Completed 4 cycles 07/03/2013, with the final cycle dose reduced 15% because of an episode of febrile neutropenia after cycle 3. Restaging studies 07/15/2013 showed partial response.  (6) started capecitabine April 2015, 1.5 g by mouth twice a day, 7 days on 7 days off--discontinued October 2015 with progression  (7) s/p Right simple mastectomy 02/17/2014 to optimize local control, the  pathology ([facility]ZA [phone]) showing an invasive ductal carcinoma measuring 4.8 cm, grade 3, with close but negative margins and evidence of treatment response  (a) nodule on right mastectomy scar noted 04/03/2014, removed 04/14/2014  (8) multiple brain metastases noted on brain MRI 02/26/2014--s/p whole brain irradiation completed 03/17/2014  (9) protein-calorie malnutrition  (10) high fall risk  (11) advanced directives: Not in place; husband is healthcare power of attorney  Lymphedema issues, if any: reports left axilla area   Pain issues, if any: Pt reports she has pain "feels like someone is hitting her with a hammer" over her knees,hips,ankles, joint pain is intermittent.  Pt takes dilaudid which helps.  Experiences nausea during pain episodes. Takes Compazine for nausea.  SAFETY ISSUES:  Prior radiation?whole brain irradiation 02/26/14   Pacemaker/ICD? No  Possible current pregnancy?No  Is the patient on methotrexate?No   Current Complaints / other details: Menarche age 24, first live birth age 67.GXP2.menopause in 2008.No hormonal replacement therapy.birth control approximately 22 years. Patient to resume capecitabine or other palliative treatment to control peripheral disease if brain stable.

## 2014-05-21 ENCOUNTER — Encounter: Payer: Self-pay | Admitting: Radiation Oncology

## 2014-05-21 ENCOUNTER — Ambulatory Visit
Admit: 2014-05-21 | Discharge: 2014-05-21 | Disposition: A | Discharge status: Home / Self Care | Payer: BC Managed Care – PPO | Attending: Radiation Oncology | Admitting: Radiation Oncology

## 2014-05-21 ENCOUNTER — Encounter (HOSPITAL_COMMUNITY): Payer: Self-pay

## 2014-05-21 ENCOUNTER — Ambulatory Visit: Payer: BC Managed Care – PPO | Admitting: Radiation Oncology

## 2014-05-21 ENCOUNTER — Other Ambulatory Visit: Payer: Self-pay | Admitting: Emergency Medicine

## 2014-05-21 ENCOUNTER — Other Ambulatory Visit: Payer: Self-pay | Admitting: Oncology

## 2014-05-21 ENCOUNTER — Ambulatory Visit (HOSPITAL_COMMUNITY)
Admission: RE | Admit: 2014-05-21 | Discharge: 2014-05-21 | Disposition: A | Discharge status: Home / Self Care | Payer: BC Managed Care – PPO | Source: Ambulatory Visit | Attending: Oncology | Admitting: Oncology

## 2014-05-21 VITALS — BP 106/63 | HR 110 | Temp 98.1°F | Resp 12

## 2014-05-21 DIAGNOSIS — Z923 Personal history of irradiation: Secondary | ICD-10-CM | POA: Insufficient documentation

## 2014-05-21 DIAGNOSIS — J90 Pleural effusion, not elsewhere classified: Secondary | ICD-10-CM | POA: Insufficient documentation

## 2014-05-21 DIAGNOSIS — C50411 Malignant neoplasm of upper-outer quadrant of right female breast: Secondary | ICD-10-CM | POA: Diagnosis not present

## 2014-05-21 DIAGNOSIS — J948 Other specified pleural conditions: Secondary | ICD-10-CM | POA: Insufficient documentation

## 2014-05-21 DIAGNOSIS — C787 Secondary malignant neoplasm of liver and intrahepatic bile duct: Secondary | ICD-10-CM | POA: Insufficient documentation

## 2014-05-21 DIAGNOSIS — R627 Adult failure to thrive: Secondary | ICD-10-CM

## 2014-05-21 DIAGNOSIS — D0590 Unspecified type of carcinoma in situ of unspecified breast: Secondary | ICD-10-CM

## 2014-05-21 DIAGNOSIS — C7800 Secondary malignant neoplasm of unspecified lung: Secondary | ICD-10-CM | POA: Diagnosis not present

## 2014-05-21 DIAGNOSIS — Z9011 Acquired absence of right breast and nipple: Secondary | ICD-10-CM | POA: Diagnosis not present

## 2014-05-21 DIAGNOSIS — Z8505 Personal history of malignant neoplasm of liver: Secondary | ICD-10-CM | POA: Insufficient documentation

## 2014-05-21 DIAGNOSIS — Z510 Encounter for antineoplastic radiation therapy: Secondary | ICD-10-CM | POA: Diagnosis not present

## 2014-05-21 DIAGNOSIS — C7801 Secondary malignant neoplasm of right lung: Secondary | ICD-10-CM

## 2014-05-21 DIAGNOSIS — K769 Liver disease, unspecified: Secondary | ICD-10-CM | POA: Diagnosis not present

## 2014-05-21 DIAGNOSIS — C7989 Secondary malignant neoplasm of other specified sites: Secondary | ICD-10-CM | POA: Insufficient documentation

## 2014-05-21 DIAGNOSIS — K5900 Constipation, unspecified: Secondary | ICD-10-CM

## 2014-05-21 DIAGNOSIS — R590 Localized enlarged lymph nodes: Secondary | ICD-10-CM | POA: Diagnosis not present

## 2014-05-21 DIAGNOSIS — R918 Other nonspecific abnormal finding of lung field: Secondary | ICD-10-CM | POA: Diagnosis not present

## 2014-05-21 DIAGNOSIS — Z853 Personal history of malignant neoplasm of breast: Secondary | ICD-10-CM | POA: Diagnosis not present

## 2014-05-21 DIAGNOSIS — C50911 Malignant neoplasm of unspecified site of right female breast: Secondary | ICD-10-CM | POA: Insufficient documentation

## 2014-05-21 DIAGNOSIS — Z9221 Personal history of antineoplastic chemotherapy: Secondary | ICD-10-CM | POA: Diagnosis not present

## 2014-05-21 DIAGNOSIS — C7931 Secondary malignant neoplasm of brain: Secondary | ICD-10-CM | POA: Insufficient documentation

## 2014-05-21 MED ORDER — IOHEXOL 300 MG/ML  SOLN
100.0000 mL | Freq: Once | INTRAMUSCULAR | Status: AC | PRN
  Administered 2014-05-21: 100 mL via INTRAVENOUS

## 2014-05-21 NOTE — Addendum Note (Signed)
Addended by: Jaci Carrel A on: 05/21/2014 09:57 AM   Modules accepted: Orders, Medications

## 2014-05-22 ENCOUNTER — Encounter: Payer: Self-pay | Admitting: *Deleted

## 2014-05-22 ENCOUNTER — Telehealth: Payer: Self-pay | Admitting: *Deleted

## 2014-05-22 ENCOUNTER — Other Ambulatory Visit: Payer: Self-pay | Admitting: Oncology

## 2014-05-22 ENCOUNTER — Telehealth: Payer: Self-pay | Admitting: Oncology

## 2014-05-22 LAB — URINE CULTURE

## 2014-05-22 NOTE — Addendum Note (Signed)
Encounter addended by: Arlyss Repress, RN on: 05/22/2014 11:57 AM<BR>     Documentation filed: Charges VN

## 2014-05-22 NOTE — Addendum Note (Signed)
Encounter addended by: Thea Silversmith, MD on: 05/22/2014 11:00 AM<BR>     Documentation filed: Follow-up Section, LOS Section

## 2014-05-22 NOTE — Telephone Encounter (Signed)
Per staff message and POF I have scheduled appts. Advised scheduler of appts. JMW  

## 2014-05-22 NOTE — Telephone Encounter (Signed)
per pof tos ch pt appt-sent MW email to sch trmt-will call pt after reply °

## 2014-05-22 NOTE — Progress Notes (Signed)
Department of Radiation Oncology  Phone:  [phone] Fax:        [phone]   Name: Carrie Berry MRN: [id number]  DOB: 03/26/1951  Date: 05/21/2014  Follow Up Visit Note  Diagnosis: Breast cancer of right female breast s/p palliative mastectomy 02/17/14   Staging form: Breast, AJCC 7th Edition     Clinical: Stage IV (T2, N1, M1) - Unsigned       Staging comments: Staged at breast conference 01/01/13  Summary and Interval since last radiation: Whole brain radiation completed 03/17/14  Interval History: Carrie Berry presents today for follow up. She had a palliative mastectomy in October with Dr. Zella Richer. This showed a large invasive ductal cancer with lymphovascular invasion, skin involvement and involevment of the dermal lymphatics.  The deep margin was close. No lymph nodes were taken.  She was found to have a chest wall recurrence along the scar just a month later and had that excised.  Her brian MRI in December was normal.  Hospice has been discussed but her husband is reluctant. She has aches and pains over her entire body but worse in her joints. She takes Dilaudid for this with good relief.  She notes that almost immediately following her surgery she had swelling under her right arm which seemed to get better and then has been growing and becoming more hard over the past month or so. She recently was found to have another chest wall nodule along the scar just medial to the incision area of the last nodule.  The sutures are still in from her last removal only a few weeks ago. She is scheduled for staging studies next week.   Physical Exam:  Filed Vitals:   05/21/14 1415  BP: 106/63  Pulse: 110  Temp: 98.1 F (36.7 C)  TempSrc: Oral  Resp: 12  SpO2: 98%   She has a surgical incision measuring about 3 cm with sutures in place near her right mastectomy car which is well healed. Just medial to this there is a 1 cm raised nodule consistent with recurrence. In addition she has hard  palpable masses which are fixed in her right axilla. Sh ehas no supraclavicular adenopathy. She is alert and oriented. She is tearful at times.   IMPRESSION: Carrie Berry is a 64 y.o. female with rapid,  progressive chest wall recurrence and clinical evidence or right axillary recurrence  PLAN:  I spoke with Carrie Berry and her husband at length today.  We discussed the role of radiation in palliating local symptoms. We discussed that RT cannot be given with doxil and my concern that her her disease is all growing given the appearance of these axillary nodes.  I requested that her CT scan be moved up to today and she will have that performed and return to clinic.  We discussed her overall poor performance and they were able to meet with our Education officer, museum.  I think Carrie Berry understands what is going on but her husband "wants to go down fighting>' and "not give up."  We discussed defining Carrie Berry's goals for the next few weeks/months and trying instead of fighting the cancer to fight to make those goals possible.    ADDENDUM: Her CT shows progression in the lung, liver, chest wall and axilla. I do not believe radiation to the chest wall lesion would benefit her at this time as she will likely not live long enough given this rapid progression elsewhere for the chest wall to become an issue. In addition, this would  delay her systemic therapy for a few weeks and likely she would progress and worsen in those 2 weeks when she is not receiving therapy to her life threatening disease. It is a hard place and I talked to them about no treatment . We discussed this with her and Carrie Berry.  They would like to consider further chemotherapy. I have left a message with Dr. Virgie Dad office with their wishes.   Thea Silversmith, MD

## 2014-05-22 NOTE — Progress Notes (Signed)
[place] Work  Clinical Social Work was referred by Sports administrator for assessment of psychosocial needs and support to pt and husband due to progression of disease.  Clinical Social Worker met with patient and husband [name] at [facility] while waiting for CT results on 05/21/14 to offer support and assess for needs.  This was first time CSW had met pt and husband and met with them at length in attempt to develop rapport and establish relationship.   Pt and husband still appear in very different places in their goals for pt's care/treatment. We discussed this some and reviewed what brings them joy. We also discussed some of the common emotions/behaviors experienced by pt's with a similar situation, as well as, caregiving concerns. CSW reviewed coping techniques and also discussed support groups, resources at [facility], etc to assist. CSW provided hand outs and info to read as well. Pt is open to these supports, but husband reports they conflict with his work schedule.  They report to have excellent support and have CNA in place at home to assist. They appear open to possible future support and CSW called to check in on pt today. She reported her daughter was with her and they were spending time together. Pt and husband aware of CSW and [facility] as they consider next steps and agree to reach out to CSW as needed.   Clinical Social Work interventions: Emotional support Supportive listening Resource education  Loren Racer, [place] Worker [place]  [place] Phone: [phone] Fax: [phone]

## 2014-05-26 ENCOUNTER — Other Ambulatory Visit (HOSPITAL_COMMUNITY): Payer: BC Managed Care – PPO

## 2014-05-26 ENCOUNTER — Telehealth (HOSPITAL_COMMUNITY): Payer: Self-pay

## 2014-05-26 ENCOUNTER — Ambulatory Visit (HOSPITAL_COMMUNITY): Payer: BC Managed Care – PPO

## 2014-05-26 LAB — CULTURE, BLOOD (ROUTINE X 2)
CULTURE: NO GROWTH
Culture: NO GROWTH

## 2014-05-26 NOTE — Telephone Encounter (Signed)
Post ED Visit - Positive Culture Follow-up  Culture report reviewed by antimicrobial stewardship pharmacist: []  Wes Rockholds, Pharm.D., BCPS [x]  Heide Guile, Pharm.D., BCPS []  Alycia Rossetti, Pharm.D., BCPS []  [place], [place].D., BCPS, AAHIVP []  Legrand Como, Pharm.D., BCPS, AAHIVP []  Isac Sarna, Pharm.D., BCPS  Positive Urine culture, 100,000 colonies -> Ecoli Treated with Cephalexin, organism sensitive to the same and no further patient follow-up is required at this time.  Dortha Kern 05/26/2014, 5:02 AM

## 2014-05-28 ENCOUNTER — Ambulatory Visit (HOSPITAL_BASED_OUTPATIENT_CLINIC_OR_DEPARTMENT_OTHER): Payer: BC Managed Care – PPO

## 2014-05-28 ENCOUNTER — Encounter (INDEPENDENT_AMBULATORY_CARE_PROVIDER_SITE_OTHER): Payer: Self-pay | Admitting: General Surgery

## 2014-05-28 ENCOUNTER — Other Ambulatory Visit: Payer: Self-pay | Admitting: *Deleted

## 2014-05-28 ENCOUNTER — Telehealth: Payer: Self-pay | Admitting: Oncology

## 2014-05-28 ENCOUNTER — Other Ambulatory Visit (HOSPITAL_BASED_OUTPATIENT_CLINIC_OR_DEPARTMENT_OTHER): Payer: BC Managed Care – PPO

## 2014-05-28 ENCOUNTER — Encounter: Payer: Self-pay | Admitting: Nurse Practitioner

## 2014-05-28 ENCOUNTER — Ambulatory Visit (HOSPITAL_BASED_OUTPATIENT_CLINIC_OR_DEPARTMENT_OTHER): Payer: BC Managed Care – PPO | Admitting: Nurse Practitioner

## 2014-05-28 VITALS — BP 108/64 | HR 109 | Temp 100.4°F | Resp 18 | Ht 63.0 in | Wt 114.1 lb

## 2014-05-28 DIAGNOSIS — C787 Secondary malignant neoplasm of liver and intrahepatic bile duct: Secondary | ICD-10-CM

## 2014-05-28 DIAGNOSIS — E46 Unspecified protein-calorie malnutrition: Secondary | ICD-10-CM

## 2014-05-28 DIAGNOSIS — C50911 Malignant neoplasm of unspecified site of right female breast: Secondary | ICD-10-CM

## 2014-05-28 DIAGNOSIS — C773 Secondary and unspecified malignant neoplasm of axilla and upper limb lymph nodes: Secondary | ICD-10-CM

## 2014-05-28 DIAGNOSIS — C7951 Secondary malignant neoplasm of bone: Secondary | ICD-10-CM

## 2014-05-28 DIAGNOSIS — C7800 Secondary malignant neoplasm of unspecified lung: Secondary | ICD-10-CM

## 2014-05-28 DIAGNOSIS — C50411 Malignant neoplasm of upper-outer quadrant of right female breast: Secondary | ICD-10-CM

## 2014-05-28 DIAGNOSIS — R509 Fever, unspecified: Secondary | ICD-10-CM

## 2014-05-28 DIAGNOSIS — Z171 Estrogen receptor negative status [ER-]: Secondary | ICD-10-CM

## 2014-05-28 DIAGNOSIS — N390 Urinary tract infection, site not specified: Secondary | ICD-10-CM | POA: Insufficient documentation

## 2014-05-28 DIAGNOSIS — C7801 Secondary malignant neoplasm of right lung: Secondary | ICD-10-CM

## 2014-05-28 DIAGNOSIS — B370 Candidal stomatitis: Secondary | ICD-10-CM

## 2014-05-28 DIAGNOSIS — Z5111 Encounter for antineoplastic chemotherapy: Secondary | ICD-10-CM

## 2014-05-28 DIAGNOSIS — C7931 Secondary malignant neoplasm of brain: Secondary | ICD-10-CM

## 2014-05-28 LAB — URINALYSIS, MICROSCOPIC - CHCC
BLOOD: NEGATIVE
Bilirubin (Urine): NEGATIVE
Glucose: NEGATIVE mg/dL
Ketones: NEGATIVE mg/dL
Nitrite: NEGATIVE
Protein: NEGATIVE mg/dL
RBC / HPF: NEGATIVE (ref 0–2)
Specific Gravity, Urine: 1.005 (ref 1.003–1.035)
Urobilinogen, UR: 0.2 mg/dL (ref 0.2–1)
pH: 7 (ref 4.6–8.0)

## 2014-05-28 LAB — COMPREHENSIVE METABOLIC PANEL (CC13)
ALK PHOS: 97 U/L (ref 40–150)
ALT: 42 U/L (ref 0–55)
AST: 27 U/L (ref 5–34)
Albumin: 3 g/dL — ABNORMAL LOW (ref 3.5–5.0)
Anion Gap: 11 mEq/L (ref 3–11)
BUN: 14.4 mg/dL (ref 7.0–26.0)
CO2: 27 mEq/L (ref 22–29)
Calcium: 8.9 mg/dL (ref 8.4–10.4)
Chloride: 100 mEq/L (ref 98–109)
Creatinine: 0.6 mg/dL (ref 0.6–1.1)
EGFR: >90 mL/min/{1.73_m2} (ref >90)
GLUCOSE: 92 mg/dL (ref 70–140)
Potassium: 3.8 mEq/L (ref 3.5–5.1)
Sodium: 138 mEq/L (ref 136–145)
TOTAL PROTEIN: 6.4 g/dL (ref 6.4–8.3)
Total Bilirubin: 0.49 mg/dL (ref 0.20–1.20)

## 2014-05-28 LAB — CBC WITH DIFFERENTIAL/PLATELET
BASO%: 0.4 % (ref 0.0–2.0)
BASOS ABS: 0 10*3/uL (ref 0.0–0.1)
EOS%: 1 % (ref 0.0–7.0)
Eosinophils Absolute: 0.1 10*3/uL (ref 0.0–0.5)
HCT: 37.7 % (ref 34.8–46.6)
HEMOGLOBIN: 13.1 g/dL (ref 11.6–15.9)
LYMPH%: 7.8 % — ABNORMAL LOW (ref 14.0–49.7)
MCH: 33 pg (ref 25.1–34.0)
MCHC: 34.7 g/dL (ref 31.5–36.0)
MCV: 95 fL (ref 79.5–101.0)
MONO#: 0.6 10*3/uL (ref 0.1–0.9)
MONO%: 5.3 % (ref 0.0–14.0)
NEUT#: 9.3 10*3/uL — ABNORMAL HIGH (ref 1.5–6.5)
NEUT%: 85.5 % — ABNORMAL HIGH (ref 38.4–76.8)
Platelets: 177 10*3/uL (ref 145–400)
RBC: 3.97 10*6/uL (ref 3.70–5.45)
RDW: 15.5 % — ABNORMAL HIGH (ref 11.2–14.5)
WBC: 10.9 10*3/uL — AB (ref 3.9–10.3)
lymph#: 0.9 10*3/uL (ref 0.9–3.3)

## 2014-05-28 LAB — TECHNOLOGIST REVIEW

## 2014-05-28 MED ORDER — SODIUM CHLORIDE 0.9 % IJ SOLN
10.0000 mL | INTRAMUSCULAR | Status: DC | PRN
  Administered 2014-05-28: 10 mL
  Filled 2014-05-28: qty 10

## 2014-05-28 MED ORDER — SODIUM CHLORIDE 0.9 % IV SOLN
Freq: Once | INTRAVENOUS | Status: AC
  Administered 2014-05-28: 11:00:00 via INTRAVENOUS

## 2014-05-28 MED ORDER — DEXAMETHASONE SODIUM PHOSPHATE 10 MG/ML IJ SOLN
10.0000 mg | Freq: Once | INTRAMUSCULAR | Status: AC
  Administered 2014-05-28: 10 mg via INTRAVENOUS

## 2014-05-28 MED ORDER — ONDANSETRON 8 MG/NS 50 ML IVPB
INTRAVENOUS | Status: AC
  Filled 2014-05-28: qty 8

## 2014-05-28 MED ORDER — DEXTROSE 5 % IV SOLN
40.0000 mg/m2 | Freq: Once | INTRAVENOUS | Status: AC
  Administered 2014-05-28: 60 mg via INTRAVENOUS
  Filled 2014-05-28: qty 30

## 2014-05-28 MED ORDER — HEPARIN SOD (PORK) LOCK FLUSH 100 UNIT/ML IV SOLN
500.0000 [IU] | Freq: Once | INTRAVENOUS | Status: AC | PRN
  Administered 2014-05-28: 500 [IU]
  Filled 2014-05-28: qty 5

## 2014-05-28 MED ORDER — ONDANSETRON 8 MG/50ML IVPB (CHCC)
8.0000 mg | Freq: Once | INTRAVENOUS | Status: AC
  Administered 2014-05-28: 8 mg via INTRAVENOUS

## 2014-05-28 MED ORDER — DEXAMETHASONE SODIUM PHOSPHATE 10 MG/ML IJ SOLN
INTRAMUSCULAR | Status: AC
Start: 2014-05-28 — End: 2014-05-28
  Filled 2014-05-28: qty 1

## 2014-05-28 MED ORDER — CIPROFLOXACIN HCL 500 MG PO TABS: 500.0000 mg | ORAL_TABLET | Freq: Two times a day (BID) | ORAL | Status: DC

## 2014-05-28 NOTE — Patient Instructions (Signed)
[facility] Discharge Instructions for Patients Receiving Chemotherapy  Today you received the following chemotherapy agents doxil  To help prevent nausea and vomiting after your treatment, we encourage you to take your nausea medication as directed   If you develop nausea and vomiting that is not controlled by your nausea medication, call the clinic.   BELOW ARE SYMPTOMS THAT SHOULD BE REPORTED IMMEDIATELY:  *FEVER GREATER THAN 100.5 F  *CHILLS WITH OR WITHOUT FEVER  NAUSEA AND VOMITING THAT IS NOT CONTROLLED WITH YOUR NAUSEA MEDICATION  *UNUSUAL SHORTNESS OF BREATH  *UNUSUAL BRUISING OR BLEEDING  TENDERNESS IN MOUTH AND THROAT WITH OR WITHOUT PRESENCE OF ULCERS  *URINARY PROBLEMS  *BOWEL PROBLEMS  UNUSUAL RASH Items with * indicate a potential emergency and should be followed up as soon as possible.  Feel free to call the clinic you have any questions or concerns. The clinic phone number is [phone].  Doxorubicin Liposomal injection What is this medicine? LIPOSOMAL DOXORUBICIN (LIP oh som al dox oh ROO bi sin) is a chemotherapy drug. This medicine is used to treat many kinds of cancer like Kaposi's sarcoma, multiple myeloma, and ovarian cancer. This medicine may be used for other purposes; ask your health care provider or pharmacist if you have questions. COMMON BRAND NAME(S): Doxil, Lipodox What should I tell my health care provider before I take this medicine? They need to know if you have any of these conditions: -blood disorders -heart disease -infection (especially a virus infection such as chickenpox, cold sores, or herpes) -liver disease -recent or ongoing radiation therapy -an unusual or allergic reaction to doxorubicin, other chemotherapy agents, soybeans, other medicines, foods, dyes, or preservatives -pregnant or trying to get pregnant -breast-feeding How should I use this medicine? This drug is given as an infusion into a vein. It is  administered in a hospital or clinic by a specially trained health care professional. If you have pain, swelling, burning or any unusual feeling around the site of your injection, tell your health care professional right away. Talk to your pediatrician regarding the use of this medicine in children. Special care may be needed. Overdosage: If you think you have taken too much of this medicine contact a poison control center or emergency room at once. NOTE: This medicine is only for you. Do not share this medicine with others. What if I miss a dose? It is important not to miss your dose. Call your doctor or health care professional if you are unable to keep an appointment. What may interact with this medicine? Do not take this medicine with any of the following medications: -zidovudine This medicine may also interact with the following medications: -medicines to increase blood counts like filgrastim, pegfilgrastim, sargramostim -vaccines Talk to your doctor or health care professional before taking any of these medicines: -acetaminophen -aspirin -ibuprofen -ketoprofen -naproxen This list may not describe all possible interactions. Give your health care provider a list of all the medicines, herbs, non-prescription drugs, or dietary supplements you use. Also tell them if you smoke, drink alcohol, or use illegal drugs. Some items may interact with your medicine. What should I watch for while using this medicine? Your condition will be monitored carefully while you are receiving this medicine. You will need important blood work done while you are taking this medicine. This drug may make you feel generally unwell. This is not uncommon, as chemotherapy can affect healthy cells as well as cancer cells. Report any side effects. Continue your course of treatment  even though you feel ill unless your doctor tells you to stop. Your urine may turn orange-red for a few days after your dose. This is not blood.  If your urine is dark or brown, call your doctor. In some cases, you may be given additional medicines to help with side effects. Follow all directions for their use. Call your doctor or health care professional for advice if you get a fever (100.5 degrees F or higher), chills or sore throat, or other symptoms of a cold or flu. Do not treat yourself. This drug decreases your body's ability to fight infections. Try to avoid being around people who are sick. This medicine may increase your risk to bruise or bleed. Call your doctor or health care professional if you notice any unusual bleeding. Be careful brushing and flossing your teeth or using a toothpick because you may get an infection or bleed more easily. If you have any dental work done, tell your dentist you are receiving this medicine. Avoid taking products that contain aspirin, acetaminophen, ibuprofen, naproxen, or ketoprofen unless instructed by your doctor. These medicines may hide a fever. Men and women of childbearing age should use effective birth control methods while using taking this medicine. Do not become pregnant while taking this medicine. There is a potential for serious side effects to an unborn child. Talk to your health care professional or pharmacist for more information. Do not breast-feed an infant while taking this medicine. Talk to your doctor about your risk of cancer. You may be more at risk for certain types of cancers if you take this medicine. What side effects may I notice from receiving this medicine? Side effects that you should report to your doctor or health care professional as soon as possible: -allergic reactions like skin rash, itching or hives, swelling of the face, lips, or tongue -low blood counts - this medicine may decrease the number of white blood cells, red blood cells and platelets. You may be at increased risk for infections and bleeding. -signs of hand-foot syndrome - tingling or burning, redness,  flaking, swelling, small blisters, or small sores on the palms of your hands or the soles of your feet -signs of infection - fever or chills, cough, sore throat, pain or difficulty passing urine -signs of decreased platelets or bleeding - bruising, pinpoint red spots on the skin, black, tarry stools, blood in the urine -signs of decreased red blood cells - unusually weak or tired, fainting spells, lightheadedness -back pain, chills, facial flushing, fever, headache, tightness in the chest or throat during the infusion -breathing problems -chest pain -fast, irregular heartbeat -mouth pain, redness, sores -pain, swelling, redness at site where injected -pain, tingling, numbness in the hands or feet -swelling of ankles, feet, or hands -vomiting Side effects that usually do not require medical attention (report to your doctor or health care professional if they continue or are bothersome): -diarrhea -hair loss -loss of appetite -nail discoloration or damage -nausea -red or watery eyes -red colored urine -stomach upset This list may not describe all possible side effects. Call your doctor for medical advice about side effects. You may report side effects to FDA at 1-800-FDA-1088. Where should I keep my medicine? This drug is given in a hospital or clinic and will not be stored at home. NOTE: This sheet is a summary. It may not cover all possible information. If you have questions about this medicine, talk to your doctor, pharmacist, or health care provider.  2015, [employer]. (2012-01-19  10:12:56)   

## 2014-05-28 NOTE — Progress Notes (Signed)
Carrie Berry 05/28/2014 2:02 PM Location: [facility] Patient #: [id number] DOB: 1950/05/22 Married / Language: Carrie Berry / Race: White Female History of Present Illness Carrie Hollingshead MD; 05/28/2014 2:27 PM) Patient words: reck.  The patient is a 64 year old female    Note:She is here for suture removal following excision of a metastatic nodule to the chest wall. She has started chemotherapy because she now has right axillary involvement by metastatic breast cancer as well. She has noticed another area just lateral to the incision where another mass has become evident in the subcutaneous tissues. She also notes swelling in the right axilla.  Allergies (Carrie Berry, CMA; 05/28/2014 2:03 PM) Codeine/Codeine Derivatives Ondansetron *ANTIEMETICS* Penicillins  Medication History (Carrie Berry, CMA; 05/28/2014 2:03 PM) Acetaminophen (325MG  Tablet, Oral) Active. Excedrin Migraine (250-250-65MG  Tablet, Oral) Active. Xeloda (500MG  Tablet, Oral) Active. Valium (5MG  Tablet, Oral) Active. EMLA (2.5-2.5% Cream, External) Active. Ativan (0.5MG  Tablet, Oral) Active. PriLOSEC (20MG  Capsule DR, Oral) Active. Zofran (8MG  Tablet, Oral) Active. Pataday (0.2% Solution, Ophthalmic) Active. Compazine (10MG  Tablet, Oral) Active. Phenergan (25MG  Tablet, Oral) Active. Tobradex (Ophthalmic) Active. TraMADol HCl (50MG  Tablet, Oral) Active. TraZODone HCl (50MG  Tablet, Oral) Active. Nasacort (55MCG/ACT Aerosol Soln, Nasal) Active. Valtrex (1GM Tablet, Oral) Active. Ambien (5MG  Tablet, Oral) Active.    Vitals (Carrie Berry CMA; 05/28/2014 2:03 PM) 05/28/2014 2:02 PM Weight: 114 lb Height: 63in Body Surface Area: 1.52 m Body Mass Index: 20.19 kg/m Pulse: 90 (Regular)  BP: 116/74 (Sitting, Left Arm, Standard)     Physical Exam Carrie Hollingshead MD; 05/28/2014 2:28 PM)  The physical exam findings are as follows: Note:General-thin female in no acute  distress. She is in a wheelchair. Her husband is with her.  Right chest wall-sutures are intact and were removed. Benzoin and Steri-Strips are applied. There is a 1.5 cm subcutaneous nodule just lateral to this scar. There is palpable fullness in the right axilla.    Assessment & Plan Carrie Hollingshead MD; 05/28/2014 2:29 PM)  METASTATIC BREAST CANCER (174.9  C50.919) Impression: Chest wall recurrences. First one was removed by excision. Currently on chemotherapy now. Has another chest wall recurrence and axillary disease.  Plan: We'll see how she responds to chemotherapy. If the other one on the chest wall needs to be removed, we would gladly see her back for this.  Current Plans Follow up as needed Free Text Instructions : discussed with patient and provided information.  Carrie Confer, MD

## 2014-05-28 NOTE — Progress Notes (Signed)
. Patient ID: Carrie Berry, female   DOB: 1951/05/04, 64 y.o.   MRN: [id number] ID: Carrie Berry OB: 1951/03/08  MR#: [id number]  CSN#:[id number]  PCP: [name], MD GYN:   SU: [name] OTHER MD: Thea Silversmith, Earle Gell, Christene Slates, Heath Gold  CHIEF COMPLAINT:  Metastatic Breast Cancer CURRENT TREATMENT: Awaiting radiation for local control  BREAST CANCER HISTORY: From the originally intake note:  Carrie Berry noted a mass in her right breast December of 2013, but did not think much of it. It did grow some lower the summer, but she was keeping her grandchild at that time and was too busy so she did not bring it to Dr. Bernell List attention until August. He set her up for bilateral mammography and ultrasonography at [facility] 12/18/2012 and this measured, on the right, a large irregular lobulated mass in the upper outer quadrant which by ultrasound measured 5.7 cm. The right axilla showed some lymph nodes with thickened cortices. In the left breast there was a focal asymmetry at the depth, lateral to the nipple, and by ultrasound there was a 9 mm ill-defined hypoechoic lesion in this location. Biopsy of the left breast lesion showed only fibrocystic changes.  Biopsy of the right breast mass and right axillary adenopathy ([id number]) on 12/23/2012 showed both to be involved by invasive ductal carcinoma, grade 2, triple negative, with an MIB-1 of 86%.  MRI of the breast 12/27/2012 at [facility] showed in the right breast a mass abutting the pectoralis muscle without enhancement of the muscle measuring 4.8 cm. The satellite nodule measuring approximately 3 mm was also noted superior to the mass and several abnormal and enlarged right axillary lymph nodes were noted, one of which appeared to be necrotic. The largest node measured 2.0 cm. Unfortunately, numerous bilateral pulmonary nodules were also noted.  The patient's subsequent history is as detailed below    INTERVAL HISTORY: Carrie Berry returns today for follow-up of her metastatic breast cancer, accompanied by her husband Carrie Berry. She is to begin doxil today, which is given every 4 weeks. There was a rad onc referral to see if she could have some radiation to the right chest wall lesion before beginning doxil, but given her rapid progression, Dr. Pablo Ledger advised that she begin chemotherapy immediately instead. The interval history is remarkable for a fever, believed to be associated with a UTI. E.coli was found to be the source and she was placed on keflex x 1 week. Initially her fevers resolved, but she is 100.4 in office today. She does not complain of dysuria, but her urine is still cloudy. Carrie Berry had essentially "random" knee pain, for which she took dilaudid. She is not having any more pain. And has not had dilaudid in almost a week.   REVIEW OF SYSTEMS Carrie Berry denies vomiting, but has some mild nausea. She is taking stool softeners and that helps with her constipation. Her appetite is declined, but she supplements this with Carnation shakes. Her taste has not quite been the same for the past few weeks. She has a thick coating to her tongue, thrush probably from steroid use. Fluid intake is a problem as she does not like to drink much. She is fatigued most of the day, and spends a lot of time in her recliner, though she continues to do some housework and cooking. She ambulates around the house with a walker but uses a wheelchair to get around the cancer center. She is visibly frustrated some, especially when her  husband pushes her to drink or eat more. She endorses some anxiety an depression. A detailed review of systems is otherwise stable.   PAST MEDICAL HISTORY: Past Medical History  Diagnosis Date  . Recurrent sinus infections   . Arthritis   . PONV (postoperative nausea and vomiting)   . GERD (gastroesophageal reflux disease)   . 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 Anxiety     no  problems at present  . rt breast ca dx'd 12/2011    breast  . Breast cancer   . 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 Tubal ligation    . Tubal ligation  1985  . Portacath placement N/A 01/10/2013    Procedure: ULTRASOUND GUIDED PORT-A-CATH INSERTION WITH FLUOROSCOPY;  Surgeon: [name], MD;  Location: [place];  Service: General;  Laterality: N/A;  . Esophagogastroduodenoscopy N/A 08/19/2013    Procedure: ESOPHAGOGASTRODUODENOSCOPY (EGD);  Surgeon: Garlan Fair, MD;  Location: [facility];  Service: Endoscopy;  Laterality: N/A;  . Total mastectomy Right 02/17/2014    Procedure: PALLIATIVE  RIGHT MASTECTOMY;  Surgeon: [name], MD;  Location: [place];  Service: General;  Laterality: Right;    FAMILY HISTORY Family History  Problem Relation Age of Onset  . Bladder Cancer Father   . Colon cancer Maternal Grandmother    the patient's parents are living, both in their 34s. The patient's father was diagnosed with bladder cancer the age of 48. The patient's mother's mother had some type of gastrointestinal cancer. The patient had one brother, no sisters. There is no history of breast or ovarian cancer in the family other than a cousin on the father's side who was diagnosed with breast cancer apparently before the age of 73.  GYNECOLOGIC HISTORY:   (Reviewed 10/13/2013) Menarche age 75, first live birth age 16, the patient is Carrie Berry P2. She went through menopause in 2008. She did not take hormone replacement. She took birth control for approximately 22 years remotely.  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 She's currently on disability. Her husband Carrie Berry Carrie Berry"), is vice Development worker, international aid for [employer]. The patient's daughter Carrie Berry is a stay-at-home mom in  [place], the patient's son Carrie Berry unfortunately died in an automobile accident in December of 2011. The patient has 2 grandchildren. She attends to a local [employer].    ADVANCED DIRECTIVES: Not in place   HEALTH MAINTENANCE: (Updated 10/13/2013) History  Substance Use Topics  . Smoking status: Never Smoker   . Smokeless tobacco: Never Used  . Alcohol Use: No     Colonoscopy: 2005  PAP: Not on file  Bone density: Not on file  Lipid panel:  Not on file/Dr. Polite   Allergies  Allergen Reactions  . Ondansetron Hcl Other (See Comments)    HEADACHE, patient states she still takes   . Codeine Nausea And Vomiting    "violently ill"  . Penicillins Rash    Childhood reaction -    Current Outpatient Prescriptions  Medication Sig Dispense Refill  . cephALEXin (KEFLEX) 500 MG capsule Take 1 capsule (500 mg total) by mouth 2 (two) times daily. 14 capsule 0  . Cholecalciferol (VITAMIN D PO) Take 1 tablet by mouth daily.    Marland Kitchen dexamethasone (DECADRON) 2 MG tablet Take 2 tabs in  AM and one in PM w food daily through [place] 12; on JAN 13 start 2 tabs in AM only; on JAN 20 take one tablet in AM only and continue until otherwise instructed 60 tablet 4  . dexamethasone (DECADRON) 4 MG tablet Take 8 mg by mouth daily.    Marland Kitchen docusate sodium 100 MG CAPS Take 100 mg by mouth daily. (Patient taking differently: Take 200 mg by mouth daily. ) 10 capsule 0  . HYDROmorphone (DILAUDID) 2 MG tablet Take 0.5 tablets (1 mg total) by mouth every 4 (four) hours as needed. (Patient taking differently: Take 2-4 mg by mouth every 4 (four) hours as needed. ) 10 tablet 0  . levETIRAcetam (KEPPRA) 1000 MG tablet Take 1 tablet (1,000 mg total) by mouth every 12 (twelve) hours. 60 tablet 4  . lidocaine-prilocaine (EMLA) cream Apply to affected area once 30 g 3  . methylphenidate (RITALIN) 5 MG tablet Take 1 tablet (5 mg total) by mouth 2 (two) times daily with breakfast and lunch. 60 tablet 0  .  mirtazapine (REMERON) 30 MG tablet Take 1 tablet (30 mg total) by mouth at bedtime. 30 tablet 1  . Multiple Vitamin (MULTIVITAMIN WITH MINERALS) TABS tablet Take 1 tablet by mouth daily.    . ondansetron (ZOFRAN) 8 MG tablet Take 1 tablet (8 mg total) by mouth 2 (two) times daily. Start the day after chemo for 2 days. Then take as needed for nausea or vomiting. 30 tablet 1  . prochlorperazine (COMPAZINE) 10 MG tablet Take 1 tablet (10 mg total) by mouth every 6 (six) hours as needed (Nausea or vomiting). 30 tablet 1  . promethazine (PHENERGAN) 25 MG tablet Take 1 tablet (25 mg total) by mouth every 6 (six) hours as needed for nausea or vomiting. 15 tablet 0  . traZODone (DESYREL) 50 MG tablet Take 2 tablets (100 mg total) by mouth at bedtime. 60 tablet 0  . fluconazole (DIFLUCAN) 100 MG tablet Take 1 tablet (100 mg total) by mouth daily. (Patient not taking: Reported on 05/20/2014) 30 tablet 4   No current facility-administered medications for this visit.    OBJECTIVE: Middle-aged white woman in no acute distress  Filed Vitals:   05/28/14 0938  BP: 108/64  Pulse: 109  Temp: 100.4 F (38 C)  Resp: 18     Body mass index is 20.22 kg/(m^2).    ECOG FS: 2 Filed Weights   05/28/14 0938  Weight: 114 lb 1.6 oz (51.755 kg)    Skin: warm, dry  HEENT: sclerae anicteric, conjunctivae pink, thrush coating to tongue Lymph Nodes: No cervical or supraclavicular lymphadenopathy  Lungs: clear to auscultation bilaterally, no rales, wheezes, or rhonci  Heart: regular rate and rhythm  Abdomen: round, soft, non tender, positive bowel sounds  Musculoskeletal: No focal spinal tenderness, no peripheral edema  Neuro: non focal, well oriented, frustrated affect  Breasts: right chest wall resection stitches clean dry and intact, erythematous nodule to right of resection   LAB RESULTS:   Lab Results  Component Value Date   WBC 10.9* 05/28/2014   NEUTROABS 9.3* 05/28/2014   HGB 13.1 05/28/2014   HCT 37.7  05/28/2014   MCV 95.0 05/28/2014   PLT 177 05/28/2014      Chemistry      Component Value Date/Time   NA 138 05/28/2014 0921   NA 134* 05/20/2014 1114   K 3.8 05/28/2014 0921   K 3.7 05/20/2014 1114   CL 99 05/20/2014 1114   CO2 27  05/28/2014 0921   CO2 24 05/20/2014 1114   BUN 14.4 05/28/2014 0921   BUN 15 05/20/2014 1114   CREATININE 0.6 05/28/2014 0921   CREATININE 0.35* 05/20/2014 1114      Component Value Date/Time   CALCIUM 8.9 05/28/2014 0921   CALCIUM 8.4 05/20/2014 1114   ALKPHOS 97 05/28/2014 0921   ALKPHOS 69 05/20/2014 1114   AST 27 05/28/2014 0921   AST 30 05/20/2014 1114   ALT 42 05/28/2014 0921   ALT 28 05/20/2014 1114   BILITOT 0.49 05/28/2014 0921   BILITOT 0.5 05/20/2014 1114        STUDIES: Dg Pelvis 1-2 Views  05/01/2014   CLINICAL DATA:  Bilateral hip pain, no trauma. History of breast cancer.  EXAM: PELVIS - 1-2 VIEW  COMPARISON:  Abdominal radiograph March 08, 2014  FINDINGS: There is no evidence of pelvic fracture or diastasis. No pelvic bone lesions are seen. Partially imaged moderate amount of retained large bowel stool.  IMPRESSION: No acute osseous process.  Partially imaged moderate amount of retained large bowel stool.   Electronically Signed   By: Elon Alas   On: 05/01/2014 05:32   Dg Ankle Complete Left  05/01/2014   CLINICAL DATA:  Ankle pain.  History of stage IV breast cancer.  EXAM: LEFT ANKLE COMPLETE - 3+ VIEW  COMPARISON:  None.  FINDINGS: There is no evidence of fracture, dislocation, or joint effusion. There is no evidence of arthropathy or other focal bone abnormality. Soft tissues are unremarkable.  IMPRESSION: Negative.   Electronically Signed   By: Jorje Guild M.D.   On: 05/01/2014 04:09   Ct Chest W Contrast  05/21/2014   CLINICAL DATA:  64 year old female with history of right-sided breast cancer diagnosed in 2013 with metastatic disease to the liver, brain and lungs. Status post right mastectomy and  chemotherapy. Chemotherapy scheduled to restart in February 2016.  EXAM: CT CHEST WITH CONTRAST  TECHNIQUE: Multidetector CT imaging of the chest was performed during intravenous contrast administration.  CONTRAST:  120mL OMNIPAQUE IOHEXOL 300 MG/ML  SOLN  COMPARISON:  CT of the chest, abdomen and pelvis 02/28/2014.  FINDINGS: Mediastinum/Lymph Nodes: Interval enlargement of several right axillary and subpectoral lymph nodes, largest of which measure up to 1.8 x 2.3 cm in the right axilla (image 21 of series 2), and 2.1 x 2.2 cm in the right subpectoral region (image 15 of series 2). No pathologically enlarged mediastinal, bilateral hilar or internal mammary lymph nodes. Heart size is normal. There is no significant pericardial fluid, thickening or pericardial calcification. Esophagus is unremarkable in appearance. Right internal jugular single-lumen porta cath with tip terminating in the distal superior vena cava.  Lungs/Pleura: For compared to the prior study there has been significant interval enlargement of all previously noted pulmonary nodules, the largest of which measure 10 x 8 mm in the anterior aspect of the right upper lobe (image 20 of series 5, previously 8 x 6 mm), and 11 x 13 mm in the left lower lobe (image 35 of series 5, previously 8 x 6 mm). There are at least 2 new pulmonary nodules, measuring 9 mm in the right lower lobe (image 32 of series 5) and 4 mm in the periphery of the left upper lobe (image 19 of series 5). Trace right pleural effusion. Tiny pleural calcification in the right costophrenic sulcus. No consolidative airspace disease.  Musculoskeletal/Soft Tissues: Postoperative changes of of right-sided modified radical mastectomy. Focal soft tissue thickening in the inferolateral aspect  of the right chest wall along the inferior margin of the surgical resection, best appreciated on image 34 of series 2 between the anterolateral aspect of the fifth and sixth ribs. There are no aggressive  appearing lytic or blastic lesions noted in the visualized portions of the skeleton.  Upper Abdomen: Multiple low-attenuation liver lesions are more prominent than the prior examination, including a 2.1 x 1.2 cm lesion in segment 4A of the liver (image 52 of series 2), and a 2.3 x 1.8 cm lesion in segment 2 of the liver (image 55 of series 2). Conversely, several of the other smaller liver lesions noted on the prior study appear resolved.  IMPRESSION: 1. Overall, today's study demonstrates progression of disease, with possible local recurrence along the right chest wall (although this may simply represent some postoperative scar tissue), right axillary and subpectoral lymphadenopathy, interval increase in size of all previously noted pulmonary nodules, at least 2 new pulmonary nodules, and several new hepatic lesions (although some of the previously noted hepatic lesions have resolved), as detailed above. 2. Additional incidental findings, as above.   Electronically Signed   By: Vinnie Langton M.D.   On: 05/21/2014 16:37   Mr Jeri Cos NW Contrast  05/12/2014   CLINICAL DATA:  Breast cancer with metastatic brain lesions. Interval radiation therapy.  EXAM: MRI HEAD WITHOUT AND WITH CONTRAST  TECHNIQUE: Multiplanar, multiecho pulse sequences of the brain and surrounding structures were obtained without and with intravenous contrast.  CONTRAST:  72mL MULTIHANCE GADOBENATE DIMEGLUMINE 529 MG/ML IV SOLN  COMPARISON:  MRI brain 02/26/2014  FINDINGS: There is significant vasogenic scratch the there significant reduction in vasogenic edema surrounding multiple peripherally enhancing mass lesions.  The right cerebellar lesion has significantly decreased in size, now measuring 8 x 10 mm.  Right parietal lesion has decreased from 15 to 8 mm on image 11 of series 23.  A lesion at the posterior margin of the right lateral ventricle has decreased 2 mm.  The largest lesion on the previous study has decreased from 18 mm to 7 mm  on image 29.  A posterior right frontal lobe lesion previously measuring 6.5 mm now measures 2.1 mm.  The largest high left frontal lobe lesion previously measured 5.6 mm and now measures 2 mm on image 42. Other left frontal and parietal lesions have decreased in size as well.  No new lesions or expanding lesions are evident.  Midline shift has markedly reduced, now measuring 1.7 mm.  Flow is present in the major intracranial arteries. The globes and orbits are intact.  The paranasal sinuses are clear. There is some fluid in the mastoid air cells bilaterally, left greater than right. No obstructing nasopharyngeal lesion is present. The fluid is similar to the prior study.  Postradiation changes of the marrow are noted throughout.  IMPRESSION: 1. Marked decrease and multiple peripherally enhancing lesions throughout both hemispheres and within the right cerebellum compatible with a positive response to therapy. 2. Marked decrease and surrounding vasogenic edema and decreased midline shift, now measuring 1.7 mm. 3. No new or expanding lesions.   Electronically Signed   By: Lawrence Santiago M.D.   On: 05/12/2014 16:37   Dg Chest Port 1 View  05/20/2014   CLINICAL DATA:  Weakness and nausea yesterday, fever today, history metastatic breast cancer  EXAM: PORTABLE CHEST - 1 VIEW  COMPARISON:  Portable exam 1129 hr compared to 02/25/2014 ; correlation CT chest 02/28/2014  FINDINGS: RIGHT jugular Port-A-Cath with tip projecting over  SVC.  Upper normal heart size.  Normal mediastinal contours and pulmonary vascularity.  Lungs clear.  No pleural effusion or pneumothorax.  Nodular focus projecting over inferior LEFT chest may represent a nipple shadow, present on prior study of 06/23/2013 as well.  Questionable small nodular focus in the RIGHT upper lobe 6 mm diameter.  Prior RIGHT mastectomy.  No acute osseous findings.  IMPRESSION: No definite acute infiltrate.  Suspected LEFT nipple shadow.  Question new nodular density in  the RIGHT upper lobe 6 mm diameter ; patient had multiple pulmonary nodules on prior CT chest, though this nodule is not seen on prior chest radiograph.   Electronically Signed   By: Lavonia Dana M.D.   On: 05/20/2014 12:18   Dg Knee Complete 4 Views Left  05/01/2014   CLINICAL DATA:  Knee pain without injury. Stage IV metastatic breast cancer.  EXAM: LEFT KNEE - COMPLETE 4+ VIEW  COMPARISON:  None.  FINDINGS: There is no evidence of fracture, dislocation, or joint effusion. There is no evidence of arthropathy or other focal bone abnormality. Soft tissues are unremarkable.  IMPRESSION: Negative. If there is ongoing unexplained knee pain, bone scan could evaluate for occult metastasis.   Electronically Signed   By: Jorje Guild M.D.   On: 05/01/2014 04:08      ASSESSMENT: 64 y.o. Carrie Berry, [place] woman status post right breast upper outer quadrant and right axillary lymph node biopsy 12/23/2012 for a clinical T3 N1 M1, stage IV invasive ductal carcinoma, grade 3, triple negative, with an MIB-1 of 86%.  (1) right liver lobe biopsy 01/21/2013 confirms metastatic adenocarcinoma; scans show involvement of the liver, lungs, and likely bone.  (2) enrolled in [facility] study [id number] (docetaxel + oral gamma secretase inibitor [id number]), received one cycle starting 02/04/2013  but withdrew because of poor tolerance despite treatment interruptions and decreased dosing of the oral component  (3)  chest CT in early November 2014 was compared with studies in August 2014 and showed interval progression of metastatic breast cancer, with an enlarging primary right breast mass, enlarging right axillary lymph nodes, enlarging pulmonary nodules and new/enlarging hepatic metastases.  (4) Abraxane started 03/19/2013, given day 1 and day 8 of each 21 day cycle, stopped with the day 1 cycle 3 dose (04/29/2013) because of local progression of disease  (5) cyclophosphamide and doxorubicin started 05/20/2013, given in dose dense  fashion  with Neulasta support on day 2. Completed 4 cycles 07/03/2013, with the final cycle dose reduced 15% because of an episode of febrile neutropenia after cycle 3. Restaging studies 07/15/2013 showed partial response.  (6) started capecitabine April 2015, 1.5 g by mouth twice a day, 7 days on 7 days off--discontinued October 2015 with progression  (7) s/p Right simple mastectomy 02/17/2014 to optimize local control, the pathology ([id number]) showing an invasive ductal carcinoma measuring 4.8 cm, grade 3, with close but negative margins and evidence of treatment response   (a) nodule on right mastectomy scar noted 04/03/2014, removed 04/14/2014  (b) new nodule noted on R chest scar 05/19/2014  (c) considering radiation to R chest wall  (8) multiple brain metastases noted on brain MRI 02/26/2014--s/p whole brain irradiation completed 03/17/2014  (a) repeat brain MRI 05/12/2014 shows favorable response  (9) protein-calorie malnutrition  (10) high fall risk  (11) advanced directives: not in place; husband is healthcare power of attorney  (12) unexplained paroxysmal knee pain: possible ruptured Baker's cyst, Left, 05/01/2014  (13) doxil to start after chest-wall irradiation (target start  date 06/23/2014)   PLAN: Emalene is doing moderately well today. The labs were reviewed in detail and were entirely stable. I consulted with Dr. [name] regarding her low grade fever. It is reasonable to assume this is still caused by her UTI, especially because her urine has not cleared up yet. We are recollecting a sample, and I plan to switch the drug from keflex to cipro 500mg  BID x 5 days. I counseled her about increasing her fluid intake as well. Breonia will proceed with Doxil as scheduled.   Jilleen has fluconazole at home that she can use for her thrush.   Angelika will return next week for labs and a follow up visit. She understands and agrees with this plan. She knows the goal of treatment in her case is  control. She has been encouraged to call with any issues that might arise before her next visit here.   Marcelino Duster, NP   05/28/2014 10:55 AM

## 2014-05-29 ENCOUNTER — Telehealth: Payer: Self-pay | Admitting: *Deleted

## 2014-05-29 NOTE — Telephone Encounter (Signed)
Denies any adverse effect from chemo. Taking antiemetics as ordered.

## 2014-05-30 LAB — URINE CULTURE

## 2014-06-03 ENCOUNTER — Ambulatory Visit (HOSPITAL_COMMUNITY)
Admission: RE | Admit: 2014-06-03 | Discharge: 2014-06-03 | Disposition: A | Discharge status: Home / Self Care | Payer: BC Managed Care – PPO | Source: Ambulatory Visit | Attending: Oncology | Admitting: Oncology

## 2014-06-03 ENCOUNTER — Other Ambulatory Visit: Payer: Self-pay | Admitting: Oncology

## 2014-06-03 DIAGNOSIS — C7800 Secondary malignant neoplasm of unspecified lung: Secondary | ICD-10-CM

## 2014-06-03 DIAGNOSIS — D0590 Unspecified type of carcinoma in situ of unspecified breast: Secondary | ICD-10-CM

## 2014-06-03 DIAGNOSIS — C50911 Malignant neoplasm of unspecified site of right female breast: Secondary | ICD-10-CM

## 2014-06-03 DIAGNOSIS — C7931 Secondary malignant neoplasm of brain: Secondary | ICD-10-CM

## 2014-06-03 DIAGNOSIS — R627 Adult failure to thrive: Secondary | ICD-10-CM

## 2014-06-03 DIAGNOSIS — K5900 Constipation, unspecified: Secondary | ICD-10-CM

## 2014-06-03 DIAGNOSIS — C787 Secondary malignant neoplasm of liver and intrahepatic bile duct: Secondary | ICD-10-CM | POA: Diagnosis not present

## 2014-06-03 DIAGNOSIS — C50919 Malignant neoplasm of unspecified site of unspecified female breast: Secondary | ICD-10-CM | POA: Diagnosis present

## 2014-06-03 DIAGNOSIS — C50411 Malignant neoplasm of upper-outer quadrant of right female breast: Secondary | ICD-10-CM

## 2014-06-03 DIAGNOSIS — C7801 Secondary malignant neoplasm of right lung: Secondary | ICD-10-CM

## 2014-06-03 MED ORDER — GADOBENATE DIMEGLUMINE 529 MG/ML IV SOLN
10.0000 mL | Freq: Once | INTRAVENOUS | Status: AC | PRN
  Administered 2014-06-03: 10 mL via INTRAVENOUS

## 2014-06-04 ENCOUNTER — Other Ambulatory Visit (HOSPITAL_BASED_OUTPATIENT_CLINIC_OR_DEPARTMENT_OTHER): Payer: BC Managed Care – PPO

## 2014-06-04 ENCOUNTER — Encounter: Payer: Self-pay | Admitting: Nurse Practitioner

## 2014-06-04 ENCOUNTER — Ambulatory Visit (HOSPITAL_BASED_OUTPATIENT_CLINIC_OR_DEPARTMENT_OTHER): Payer: BC Managed Care – PPO | Admitting: Nurse Practitioner

## 2014-06-04 VITALS — BP 104/63 | HR 95 | Temp 98.6°F | Resp 18 | Ht 63.0 in | Wt 114.8 lb

## 2014-06-04 DIAGNOSIS — C50911 Malignant neoplasm of unspecified site of right female breast: Secondary | ICD-10-CM

## 2014-06-04 DIAGNOSIS — C787 Secondary malignant neoplasm of liver and intrahepatic bile duct: Secondary | ICD-10-CM

## 2014-06-04 DIAGNOSIS — C7931 Secondary malignant neoplasm of brain: Secondary | ICD-10-CM

## 2014-06-04 DIAGNOSIS — C773 Secondary and unspecified malignant neoplasm of axilla and upper limb lymph nodes: Secondary | ICD-10-CM

## 2014-06-04 DIAGNOSIS — C50411 Malignant neoplasm of upper-outer quadrant of right female breast: Secondary | ICD-10-CM

## 2014-06-04 DIAGNOSIS — C7801 Secondary malignant neoplasm of right lung: Secondary | ICD-10-CM

## 2014-06-04 DIAGNOSIS — C7800 Secondary malignant neoplasm of unspecified lung: Secondary | ICD-10-CM

## 2014-06-04 LAB — CBC WITH DIFFERENTIAL/PLATELET
BASO%: 0.1 % (ref 0.0–2.0)
Basophils Absolute: 0 10*3/uL (ref 0.0–0.1)
EOS ABS: 0 10*3/uL (ref 0.0–0.5)
EOS%: 0.5 % (ref 0.0–7.0)
HEMATOCRIT: 36 % (ref 34.8–46.6)
HEMOGLOBIN: 12.2 g/dL (ref 11.6–15.9)
LYMPH%: 3.6 % — ABNORMAL LOW (ref 14.0–49.7)
MCH: 32.4 pg (ref 25.1–34.0)
MCHC: 33.9 g/dL (ref 31.5–36.0)
MCV: 95.7 fL (ref 79.5–101.0)
MONO#: 0.1 10*3/uL (ref 0.1–0.9)
MONO%: 1.1 % (ref 0.0–14.0)
NEUT%: 94.7 % — AB (ref 38.4–76.8)
NEUTROS ABS: 7.7 10*3/uL — AB (ref 1.5–6.5)
Platelets: 170 10*3/uL (ref 145–400)
RBC: 3.76 10*6/uL (ref 3.70–5.45)
RDW: 15.9 % — ABNORMAL HIGH (ref 11.2–14.5)
WBC: 8.1 10*3/uL (ref 3.9–10.3)
lymph#: 0.3 10*3/uL — ABNORMAL LOW (ref 0.9–3.3)

## 2014-06-04 LAB — COMPREHENSIVE METABOLIC PANEL (CC13)
ALT: 121 U/L — AB (ref 0–55)
ANION GAP: 11 meq/L (ref 3–11)
AST: 68 U/L — ABNORMAL HIGH (ref 5–34)
Albumin: 2.9 g/dL — ABNORMAL LOW (ref 3.5–5.0)
Alkaline Phosphatase: 107 U/L (ref 40–150)
BILIRUBIN TOTAL: 0.4 mg/dL (ref 0.20–1.20)
BUN: 15.8 mg/dL (ref 7.0–26.0)
CHLORIDE: 100 meq/L (ref 98–109)
CO2: 24 meq/L (ref 22–29)
Calcium: 8.2 mg/dL — ABNORMAL LOW (ref 8.4–10.4)
Creatinine: 0.6 mg/dL (ref 0.6–1.1)
GLUCOSE: 107 mg/dL (ref 70–140)
Potassium: 4.7 mEq/L (ref 3.5–5.1)
Sodium: 135 mEq/L — ABNORMAL LOW (ref 136–145)
Total Protein: 6 g/dL — ABNORMAL LOW (ref 6.4–8.3)

## 2014-06-04 NOTE — Progress Notes (Signed)
. Patient ID: Carrie Berry, female   DOB: [birth year], 64 y.o.   MRN: [id number] ID: Carrie Berry OB: [birth year]  MR#: [id number]  CSN#:[id number]  PCP: [name], MD GYN:   SU: [name] OTHER MD: Thea Silversmith, Earle Gell, Christene Slates, Heath Gold  CHIEF COMPLAINT:  Metastatic Breast Cancer CURRENT TREATMENT: Awaiting radiation for local control  BREAST CANCER HISTORY: From the originally intake note:  Carrie Berry noted a mass in her right breast December of 2013, but did not think much of it. It did grow some lower the summer, but she was keeping her grandchild at that time and was too busy so she did not bring it to Dr. Bernell List attention until August. He set her up for bilateral mammography and ultrasonography at [facility] 12/18/2012 and this measured, on the right, a large irregular lobulated mass in the upper outer quadrant which by ultrasound measured 5.7 cm. The right axilla showed some lymph nodes with thickened cortices. In the left breast there was a focal asymmetry at the depth, lateral to the nipple, and by ultrasound there was a 9 mm ill-defined hypoechoic lesion in this location. Biopsy of the left breast lesion showed only fibrocystic changes.  Biopsy of the right breast mass and right axillary adenopathy ([id number]) on 12/23/2012 showed both to be involved by invasive ductal carcinoma, grade 2, triple negative, with an MIB-1 of 86%.  MRI of the breast 12/27/2012 at [facility] showed in the right breast a mass abutting the pectoralis muscle without enhancement of the muscle measuring 4.8 cm. The satellite nodule measuring approximately 3 mm was also noted superior to the mass and several abnormal and enlarged right axillary lymph nodes were noted, one of which appeared to be necrotic. The largest node measured 2.0 cm. Unfortunately, numerous bilateral pulmonary nodules were also noted.  The patient's subsequent history is as detailed below    INTERVAL HISTORY: Carrie Berry returns today for follow-up of her metastatic breast cancer, accompanied by her husband [name]. Today is day 8, cycle 1 of doxil, which is given once every 28 days. Dr. Maye Hides removed stitches from a previous chest wall nodule resection and evaluated the new area just lateral to that incision. Per his note on 05/28/14, we should see how she responds to chemotherapy and if this newer lesion need removal, he will handle it.  REVIEW OF SYSTEMS Carrie Berry denies fevers or chills. She has had increased nausea this past week since chemo, but is using compazine to resolve it. She is using colace for her constipation. She complains of increased abdominal pain and "rumbling" that is uncomfortable. She used some dilaudid and it relieved the pain temporarily. Her appetite is poor and she is not taking in many fluids. Her fatigue continues, and she is less active around the house. She is anxious and depressed. She uses trazodone to help her sleep. She denies shortness of breath, chest pain, cough, or palpitations. A detailed review of systems is otherwise stable.   PAST MEDICAL HISTORY: Past Medical History  Diagnosis Date  . Recurrent sinus infections   . Arthritis   . PONV (postoperative nausea and vomiting)   . GERD (gastroesophageal reflux disease)   . 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 Anxiety     no problems at present  . rt breast ca dx'd 12/2011    breast  . Breast cancer   . 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 Tubal ligation    . Tubal ligation  1985  . Portacath placement N/A 01/10/2013    Procedure: ULTRASOUND GUIDED PORT-A-CATH INSERTION WITH FLUOROSCOPY;  Surgeon: [name], MD;  Location: [place];  Service: General;  Laterality: N/A;  . Esophagogastroduodenoscopy N/A 08/19/2013    Procedure: ESOPHAGOGASTRODUODENOSCOPY (EGD);  Surgeon:  Garlan Fair, MD;  Location: [facility];  Service: Endoscopy;  Laterality: N/A;  . Total mastectomy Right 02/17/2014    Procedure: PALLIATIVE  RIGHT MASTECTOMY;  Surgeon: [name], MD;  Location: [place];  Service: General;  Laterality: Right;    FAMILY HISTORY Family History  Problem Relation Age of Onset  . Bladder Cancer Father   . Colon cancer Maternal Grandmother    the patient's parents are living, both in their 61s. The patient's father was diagnosed with bladder cancer the age of 61. The patient's mother's mother had some type of gastrointestinal cancer. The patient had one brother, no sisters. There is no history of breast or ovarian cancer in the family other than a cousin on the father's side who was diagnosed with breast cancer apparently before the age of 74.  GYNECOLOGIC HISTORY:   (Reviewed 10/13/2013) Menarche age 33, first live birth age 89, the patient is [place] P2. She went through menopause in 2008. She did not take hormone replacement. She took birth control for approximately 22 years remotely.  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 She's currently on disability. Her husband Nainika Newlun [name]"), is vice Development worker, international aid for [employer]. The patient's daughter [name] is a stay-at-home mom in [place], the patient's son Fendi Meinhardt unfortunately died in an automobile accident in December of 2011. The patient has 2 grandchildren. She attends to a local [employer].    ADVANCED DIRECTIVES: Not in place   HEALTH MAINTENANCE: (Updated 10/13/2013) History  Substance Use Topics  . Smoking status: Never Smoker   . Smokeless tobacco: Never Used  . Alcohol Use: No     Colonoscopy: 2005  PAP: Not on file  Bone density: Not on file  Lipid panel:  Not on file/Dr. Polite   Allergies  Allergen Reactions  . Ondansetron Hcl Other (See  Comments)    HEADACHE, patient states she still takes   . Codeine Nausea And Vomiting    "violently ill"  . Penicillins Rash    Childhood reaction -    Current Outpatient Prescriptions  Medication Sig Dispense Refill  . Cholecalciferol (VITAMIN D PO) Take 1 tablet by mouth daily.    Marland Kitchen dexamethasone (DECADRON) 2 MG tablet Take 2 tabs in AM and one in PM w food daily through [place] 12; on JAN 13 start 2 tabs in AM only; on JAN 20 take one tablet in AM only and continue until otherwise instructed 60 tablet 4  . docusate sodium 100 MG CAPS Take 100 mg by mouth daily. (Patient taking differently: Take 200 mg by mouth daily. ) 10 capsule 0  . HYDROmorphone (DILAUDID) 2 MG tablet Take 0.5 tablets (1 mg total) by mouth every 4 (four) hours as needed. 10 tablet 0  . levETIRAcetam (KEPPRA) 1000 MG tablet Take 1 tablet (1,000 mg total) by mouth every 12 (twelve) hours. 60 tablet 4  . lidocaine-prilocaine (EMLA) cream Apply to affected area once 30 g 3  . methylphenidate (RITALIN) 5 MG tablet Take 1 tablet (  5 mg total) by mouth 2 (two) times daily with breakfast and lunch. 60 tablet 0  . mirtazapine (REMERON) 30 MG tablet Take 1 tablet (30 mg total) by mouth at bedtime. 30 tablet 1  . Multiple Vitamin (MULTIVITAMIN WITH MINERALS) TABS tablet Take 1 tablet by mouth daily.    . ondansetron (ZOFRAN) 8 MG tablet Take 1 tablet (8 mg total) by mouth 2 (two) times daily. Start the day after chemo for 2 days. Then take as needed for nausea or vomiting. 30 tablet 1  . prochlorperazine (COMPAZINE) 10 MG tablet Take 1 tablet (10 mg total) by mouth every 6 (six) hours as needed (Nausea or vomiting). 30 tablet 1  . traZODone (DESYREL) 50 MG tablet Take 2 tablets (100 mg total) by mouth at bedtime. 60 tablet 0  . dexamethasone (DECADRON) 4 MG tablet Take 8 mg by mouth daily.    . fluconazole (DIFLUCAN) 100 MG tablet Take 1 tablet (100 mg total) by mouth daily. (Patient not taking: Reported on 05/20/2014) 30 tablet 4  .  promethazine (PHENERGAN) 25 MG tablet Take 1 tablet (25 mg total) by mouth every 6 (six) hours as needed for nausea or vomiting. (Patient not taking: Reported on 06/04/2014) 15 tablet 0   No current facility-administered medications for this visit.    OBJECTIVE: Middle-aged white woman in no acute distress  Filed Vitals:   06/04/14 1442  BP: 104/63  Pulse: 95  Temp: 98.6 F (37 C)  Resp: 18     Body mass index is 20.34 kg/(m^2).    ECOG FS: 2 Filed Weights   06/04/14 1442  Weight: 114 lb 12.8 oz (52.073 kg)    Skin: warm, dry  HEENT: sclerae anicteric, conjunctivae pink, Lymph Nodes: No cervical or supraclavicular lymphadenopathy  Lungs: clear to auscultation bilaterally, no rales, wheezes, or rhonci  Heart: regular rate and rhythm  Abdomen: round, soft, non tender, positive bowel sounds  Musculoskeletal: No focal spinal tenderness, no peripheral edema  Neuro: non focal, well oriented, fatigued affect  Breasts: right axillary fullness, right chest wall recurrence slightly larger than previously noted.   Image taken 06/04/14     LAB RESULTS:   Lab Results  Component Value Date   WBC 8.1 06/04/2014   NEUTROABS 7.7* 06/04/2014   HGB 12.2 06/04/2014   HCT 36.0 06/04/2014   MCV 95.7 06/04/2014   PLT 170 06/04/2014      Chemistry      Component Value Date/Time   NA 138 05/28/2014 0921   NA 134* 05/20/2014 1114   K 3.8 05/28/2014 0921   K 3.7 05/20/2014 1114   CL 99 05/20/2014 1114   CO2 27 05/28/2014 0921   CO2 24 05/20/2014 1114   BUN 14.4 05/28/2014 0921   BUN 15 05/20/2014 1114   CREATININE 0.6 05/28/2014 0921   CREATININE 0.35* 05/20/2014 1114      Component Value Date/Time   CALCIUM 8.9 05/28/2014 0921   CALCIUM 8.4 05/20/2014 1114   ALKPHOS 97 05/28/2014 0921   ALKPHOS 69 05/20/2014 1114   AST 27 05/28/2014 0921   AST 30 05/20/2014 1114   ALT 42 05/28/2014 0921   ALT 28 05/20/2014 1114   BILITOT 0.49 05/28/2014 0921   BILITOT 0.5 05/20/2014 1114         STUDIES: Ct Chest W Contrast  05/21/2014   CLINICAL DATA:  64 year old female with history of right-sided breast cancer diagnosed in 2013 with metastatic disease to the liver, brain and lungs. Status post  right mastectomy and chemotherapy. Chemotherapy scheduled to restart in February 2016.  EXAM: CT CHEST WITH CONTRAST  TECHNIQUE: Multidetector CT imaging of the chest was performed during intravenous contrast administration.  CONTRAST:  183mL OMNIPAQUE IOHEXOL 300 MG/ML  SOLN  COMPARISON:  CT of the chest, abdomen and pelvis 02/28/2014.  FINDINGS: Mediastinum/Lymph Nodes: Interval enlargement of several right axillary and subpectoral lymph nodes, largest of which measure up to 1.8 x 2.3 cm in the right axilla (image 21 of series 2), and 2.1 x 2.2 cm in the right subpectoral region (image 15 of series 2). No pathologically enlarged mediastinal, bilateral hilar or internal mammary lymph nodes. Heart size is normal. There is no significant pericardial fluid, thickening or pericardial calcification. Esophagus is unremarkable in appearance. Right internal jugular single-lumen porta cath with tip terminating in the distal superior vena cava.  Lungs/Pleura: For compared to the prior study there has been significant interval enlargement of all previously noted pulmonary nodules, the largest of which measure 10 x 8 mm in the anterior aspect of the right upper lobe (image 20 of series 5, previously 8 x 6 mm), and 11 x 13 mm in the left lower lobe (image 35 of series 5, previously 8 x 6 mm). There are at least 2 new pulmonary nodules, measuring 9 mm in the right lower lobe (image 32 of series 5) and 4 mm in the periphery of the left upper lobe (image 19 of series 5). Trace right pleural effusion. Tiny pleural calcification in the right costophrenic sulcus. No consolidative airspace disease.  Musculoskeletal/Soft Tissues: Postoperative changes of of right-sided modified radical mastectomy. Focal soft tissue  thickening in the inferolateral aspect of the right chest wall along the inferior margin of the surgical resection, best appreciated on image 34 of series 2 between the anterolateral aspect of the fifth and sixth ribs. There are no aggressive appearing lytic or blastic lesions noted in the visualized portions of the skeleton.  Upper Abdomen: Multiple low-attenuation liver lesions are more prominent than the prior examination, including a 2.1 x 1.2 cm lesion in segment 4A of the liver (image 52 of series 2), and a 2.3 x 1.8 cm lesion in segment 2 of the liver (image 55 of series 2). Conversely, several of the other smaller liver lesions noted on the prior study appear resolved.  IMPRESSION: 1. Overall, today's study demonstrates progression of disease, with possible local recurrence along the right chest wall (although this may simply represent some postoperative scar tissue), right axillary and subpectoral lymphadenopathy, interval increase in size of all previously noted pulmonary nodules, at least 2 new pulmonary nodules, and several new hepatic lesions (although some of the previously noted hepatic lesions have resolved), as detailed above. 2. Additional incidental findings, as above.   Electronically Signed   By: Vinnie Langton M.D.   On: 05/21/2014 16:37   Mr Jeri Cos TO Contrast  05/12/2014   CLINICAL DATA:  Breast cancer with metastatic brain lesions. Interval radiation therapy.  EXAM: MRI HEAD WITHOUT AND WITH CONTRAST  TECHNIQUE: Multiplanar, multiecho pulse sequences of the brain and surrounding structures were obtained without and with intravenous contrast.  CONTRAST:  58mL MULTIHANCE GADOBENATE DIMEGLUMINE 529 MG/ML IV SOLN  COMPARISON:  MRI brain 02/26/2014  FINDINGS: There is significant vasogenic scratch the there significant reduction in vasogenic edema surrounding multiple peripherally enhancing mass lesions.  The right cerebellar lesion has significantly decreased in size, now measuring 8 x 10  mm.  Right parietal lesion has decreased from 15  to 8 mm on image 11 of series 23.  A lesion at the posterior margin of the right lateral ventricle has decreased 2 mm.  The largest lesion on the previous study has decreased from 18 mm to 7 mm on image 29.  A posterior right frontal lobe lesion previously measuring 6.5 mm now measures 2.1 mm.  The largest high left frontal lobe lesion previously measured 5.6 mm and now measures 2 mm on image 42. Other left frontal and parietal lesions have decreased in size as well.  No new lesions or expanding lesions are evident.  Midline shift has markedly reduced, now measuring 1.7 mm.  Flow is present in the major intracranial arteries. The globes and orbits are intact.  The paranasal sinuses are clear. There is some fluid in the mastoid air cells bilaterally, left greater than right. No obstructing nasopharyngeal lesion is present. The fluid is similar to the prior study.  Postradiation changes of the marrow are noted throughout.  IMPRESSION: 1. Marked decrease and multiple peripherally enhancing lesions throughout both hemispheres and within the right cerebellum compatible with a positive response to therapy. 2. Marked decrease and surrounding vasogenic edema and decreased midline shift, now measuring 1.7 mm. 3. No new or expanding lesions.   Electronically Signed   By: Lawrence Santiago M.D.   On: 05/12/2014 16:37   Mr Liver W Wo Contrast  06/03/2014   CLINICAL DATA:  Followup metastatic breast carcinoma.  EXAM: MRI ABDOMEN WITHOUT AND WITH CONTRAST  TECHNIQUE: Multiplanar multisequence MR imaging of the abdomen was performed both before and after the administration of intravenous contrast.  CONTRAST:  24mL MULTIHANCE GADOBENATE DIMEGLUMINE 529 MG/ML IV SOLN  COMPARISON:  CT on 02/28/2014 and MRI on 12/04/2013  FINDINGS: Lower chest:  Unremarkable.  Hepatobiliary: Multiple new liver metastases are seen in the medial and lateral segments of the left hepatic lobe, with largest  measuring 2.3 cm on image 29/series 1102. A new metastasis is seen in the anterior segment right lobe measuring 1.4 cm on image 34/series 1102. A few other tiny sub-cm lesions in the right hepatic lobe remain stable.  Pancreas: No cystic or solid masses identified. No peripancreatic inflammatory changes or fluid collections demonstrated.  Spleen:  Within normal limits in size and appearance.  Adrenal Glands:  No masses identified.  Kidneys: No complex cystic or solid masses identified. No evidence of urolithiasis or hydronephrosis.  Stomach/Bowel/Peritoneum: No evidence of wall thickening, mass, or obstruction involving visualized abdominal bowel.  Vascular/Lymphatic: No pathologically enlarged lymph nodes identified. No other significant abnormality noted.  Other:  None.  Musculoskeletal:  No suspicious bone lesions identified.  IMPRESSION: Further interval progression of liver metastases compared with prior exams.  No other sites of metastatic disease identified within the abdomen.   Electronically Signed   By: Earle Gell M.D.   On: 06/03/2014 17:01   Dg Chest Port 1 View  05/20/2014   CLINICAL DATA:  Weakness and nausea yesterday, fever today, history metastatic breast cancer  EXAM: PORTABLE CHEST - 1 VIEW  COMPARISON:  Portable exam 1129 hr compared to 02/25/2014 ; correlation CT chest 02/28/2014  FINDINGS: RIGHT jugular Port-A-Cath with tip projecting over SVC.  Upper normal heart size.  Normal mediastinal contours and pulmonary vascularity.  Lungs clear.  No pleural effusion or pneumothorax.  Nodular focus projecting over inferior LEFT chest may represent a nipple shadow, present on prior study of 06/23/2013 as well.  Questionable small nodular focus in the RIGHT upper lobe 6 mm diameter.  Prior RIGHT mastectomy.  No acute osseous findings.  IMPRESSION: No definite acute infiltrate.  Suspected LEFT nipple shadow.  Question new nodular density in the RIGHT upper lobe 6 mm diameter ; patient had multiple  pulmonary nodules on prior CT chest, though this nodule is not seen on prior chest radiograph.   Electronically Signed   By: Lavonia Dana M.D.   On: 05/20/2014 12:18      ASSESSMENT: 64 y.o. Carrie Berry, [place] woman status post right breast upper outer quadrant and right axillary lymph node biopsy 12/23/2012 for a clinical T3 N1 M1, stage IV invasive ductal carcinoma, grade 3, triple negative, with an MIB-1 of 86%.  (1) right liver lobe biopsy 01/21/2013 confirms metastatic adenocarcinoma; scans show involvement of the liver, lungs, and likely bone.  (2) enrolled in [facility] study [id number] (docetaxel + oral gamma secretase inibitor [id number]), received one cycle starting 02/04/2013  but withdrew because of poor tolerance despite treatment interruptions and decreased dosing of the oral component  (3)  chest CT in early November 2014 was compared with studies in August 2014 and showed interval progression of metastatic breast cancer, with an enlarging primary right breast mass, enlarging right axillary lymph nodes, enlarging pulmonary nodules and new/enlarging hepatic metastases.  (4) Abraxane started 03/19/2013, given day 1 and day 8 of each 21 day cycle, stopped with the day 1 cycle 3 dose (04/29/2013) because of local progression of disease  (5) cyclophosphamide and doxorubicin started 05/20/2013, given in dose dense fashion  with Neulasta support on day 2. Completed 4 cycles 07/03/2013, with the final cycle dose reduced 15% because of an episode of febrile neutropenia after cycle 3. Restaging studies 07/15/2013 showed partial response.  (6) started capecitabine April 2015, 1.5 g by mouth twice a day, 7 days on 7 days off--discontinued October 2015 with progression  (7) s/p Right simple mastectomy 02/17/2014 to optimize local control, the pathology ([id number]) showing an invasive ductal carcinoma measuring 4.8 cm, grade 3, with close but negative margins and evidence of treatment response   (a) nodule on  right mastectomy scar noted 04/03/2014, removed 04/14/2014  (b) new nodule noted on R chest scar 05/19/2014  (c) considering radiation to R chest wall  (8) multiple brain metastases noted on brain MRI 02/26/2014--s/p whole brain irradiation completed 03/17/2014  (a) repeat brain MRI 05/12/2014 shows favorable response  (9) protein-calorie malnutrition  (10) high fall risk  (11) advanced directives: not in place; husband is healthcare power of attorney  (12) unexplained paroxysmal knee pain: possible ruptured Baker's cyst, Left, 05/01/2014  (13) doxil to start after chest-wall irradiation (target start date 06/23/2014)   PLAN: Adriahna is not doing well today, but overall she thinks the Doxil went fine. The labs were reviewed in detail and were relatively stable. She will continue to manage her nausea and bowel on her own. I counseled her on increasing her fluid intake some and she understands.   Branden will return in 3 weeks for the start of cycle 2 of doxil. She has been given instructions to call for an earlier appointment if her chest wall lesion becomes any larger or painful. She understands and agrees with this plan. She knows the goal of treatment in her case is control. She has been encouraged to call with any issues that might arise before her next visit here.  Marcelino Duster, NP   06/04/2014 3:12 PM

## 2014-06-06 ENCOUNTER — Other Ambulatory Visit: Payer: Self-pay | Admitting: Oncology

## 2014-06-06 DIAGNOSIS — C50911 Malignant neoplasm of unspecified site of right female breast: Secondary | ICD-10-CM

## 2014-06-08 ENCOUNTER — Other Ambulatory Visit (HOSPITAL_BASED_OUTPATIENT_CLINIC_OR_DEPARTMENT_OTHER): Payer: BC Managed Care – PPO

## 2014-06-08 ENCOUNTER — Other Ambulatory Visit: Payer: Self-pay

## 2014-06-08 ENCOUNTER — Other Ambulatory Visit: Payer: Self-pay | Admitting: *Deleted

## 2014-06-08 ENCOUNTER — Telehealth: Payer: Self-pay | Admitting: *Deleted

## 2014-06-08 ENCOUNTER — Ambulatory Visit (HOSPITAL_BASED_OUTPATIENT_CLINIC_OR_DEPARTMENT_OTHER): Payer: BC Managed Care – PPO | Admitting: Nurse Practitioner

## 2014-06-08 VITALS — BP 110/70 | HR 105 | Temp 100.8°F | Resp 18 | Ht 63.0 in | Wt 116.8 lb

## 2014-06-08 DIAGNOSIS — C787 Secondary malignant neoplasm of liver and intrahepatic bile duct: Secondary | ICD-10-CM

## 2014-06-08 DIAGNOSIS — C7800 Secondary malignant neoplasm of unspecified lung: Secondary | ICD-10-CM

## 2014-06-08 DIAGNOSIS — N390 Urinary tract infection, site not specified: Secondary | ICD-10-CM

## 2014-06-08 DIAGNOSIS — C50411 Malignant neoplasm of upper-outer quadrant of right female breast: Secondary | ICD-10-CM

## 2014-06-08 DIAGNOSIS — C50911 Malignant neoplasm of unspecified site of right female breast: Secondary | ICD-10-CM

## 2014-06-08 DIAGNOSIS — E46 Unspecified protein-calorie malnutrition: Secondary | ICD-10-CM

## 2014-06-08 DIAGNOSIS — C7931 Secondary malignant neoplasm of brain: Secondary | ICD-10-CM

## 2014-06-08 LAB — COMPREHENSIVE METABOLIC PANEL (CC13)
ALT: 54 U/L (ref 0–55)
AST: 28 U/L (ref 5–34)
Albumin: 2.8 g/dL — ABNORMAL LOW (ref 3.5–5.0)
Alkaline Phosphatase: 94 U/L (ref 40–150)
Anion Gap: 12 mEq/L — ABNORMAL HIGH (ref 3–11)
BUN: 14.6 mg/dL (ref 7.0–26.0)
CO2: 23 mEq/L (ref 22–29)
CREATININE: 0.6 mg/dL (ref 0.6–1.1)
Calcium: 8.3 mg/dL — ABNORMAL LOW (ref 8.4–10.4)
Chloride: 102 mEq/L (ref 98–109)
Glucose: 118 mg/dl (ref 70–140)
POTASSIUM: 4.5 meq/L (ref 3.5–5.1)
SODIUM: 137 meq/L (ref 136–145)
Total Bilirubin: 0.44 mg/dL (ref 0.20–1.20)
Total Protein: 6.1 g/dL — ABNORMAL LOW (ref 6.4–8.3)

## 2014-06-08 LAB — CBC WITH DIFFERENTIAL/PLATELET
BASO%: 0.3 % (ref 0.0–2.0)
BASOS ABS: 0 10*3/uL (ref 0.0–0.1)
EOS%: 0.8 % (ref 0.0–7.0)
Eosinophils Absolute: 0.1 10*3/uL (ref 0.0–0.5)
HCT: 32.6 % — ABNORMAL LOW (ref 34.8–46.6)
HGB: 11.2 g/dL — ABNORMAL LOW (ref 11.6–15.9)
LYMPH#: 0.3 10*3/uL — AB (ref 0.9–3.3)
LYMPH%: 5.4 % — ABNORMAL LOW (ref 14.0–49.7)
MCH: 32.7 pg (ref 25.1–34.0)
MCHC: 34.4 g/dL (ref 31.5–36.0)
MCV: 95.3 fL (ref 79.5–101.0)
MONO#: 0.1 10*3/uL (ref 0.1–0.9)
MONO%: 0.9 % (ref 0.0–14.0)
NEUT%: 92.6 % — ABNORMAL HIGH (ref 38.4–76.8)
NEUTROS ABS: 5.9 10*3/uL (ref 1.5–6.5)
Platelets: 126 10*3/uL — ABNORMAL LOW (ref 145–400)
RBC: 3.42 10*6/uL — AB (ref 3.70–5.45)
RDW: 16.5 % — ABNORMAL HIGH (ref 11.2–14.5)
WBC: 6.3 10*3/uL (ref 3.9–10.3)

## 2014-06-08 LAB — URINALYSIS, MICROSCOPIC - CHCC
Bilirubin (Urine): NEGATIVE
Glucose: NEGATIVE mg/dL
KETONES: NEGATIVE mg/dL
Nitrite: NEGATIVE
Protein: NEGATIVE mg/dL
Specific Gravity, Urine: 1.005 (ref 1.003–1.035)
Urobilinogen, UR: 0.2 mg/dL (ref 0.2–1)
pH: 7 (ref 4.6–8.0)

## 2014-06-08 MED ORDER — LEVOFLOXACIN 500 MG PO TABS: 500.0000 mg | ORAL_TABLET | Freq: Every day | ORAL | Status: DC

## 2014-06-09 ENCOUNTER — Encounter: Payer: Self-pay | Admitting: Nurse Practitioner

## 2014-06-09 ENCOUNTER — Telehealth: Payer: Self-pay | Admitting: *Deleted

## 2014-06-09 NOTE — Assessment & Plan Note (Signed)
Patient completed Cipro antibiotics on 06/02/2014 for a total of 5 days of treatment for previously diagnosed UTI last week.  She continues to complain of dysuria, frequency, and without odor to her urine.  Patient denies any vaginal discharge or flank pain.  Patient does have a temperature of 100.8 while at the [place] today.  Urinalysis obtained today did reveal recurrent UTI.  Previous urine culture obtained in early January 2016 revealed that culture was sensitive to Levaquin.  Will try Levaquin antibiotics for a total of 10 days; pending urine culture results.  Advised both patient and her husband to call/return if she develops any worsening symptoms whatsoever.

## 2014-06-09 NOTE — Assessment & Plan Note (Signed)
Patient is status post whole brain radiation completed in November 2015.  She received cycle one of her Doxil chemotherapy on 05/28/2014.  Patient scheduled to return for labs and a follow-up visit on 06/23/2014.  She is scheduled to return for cycle 2 of the same regimen on 06/25/2014.

## 2014-06-09 NOTE — Telephone Encounter (Signed)
Spoke with pt today and was informed that pt was feeling much better.  Able to drink more fluids.  Stated did not feel like she had any fever.  Started on Levaquin yesterday.  Pt understood to call office back with any new problems.

## 2014-06-09 NOTE — Progress Notes (Signed)
SYMPTOM MANAGEMENT CLINIC   HPI: Carrie Berry 64 y.o. female diagnosed with breast cancer; with metastasis to brain, liver, and lung.  Patient is status post whole brain irradiation; is currently undergoing Doxil chemotherapy regimen.  Patient called the cancer Center today requesting urgent care visit.  She has been treated with for the past 1-2 weeks for urinary tract infection with Cipro for a total of 5 days.  She completed the Cipro on Tuesday, 06/02/2014.  She is now complaining of recurrent dysuria, frequency, and a foul odor to her urine.  She has also had temperature to maximum of 100.5.  She denies any nausea, vomiting, diarrhea, or constipation.   HPI  CURRENT THERAPY: Upcoming Treatment Dates - BREAST METASTATIC Liposomal Doxorubicin q28d Days with orders from any treatment category:  06/25/2014      SCHEDULING COMMUNICATION      ondansetron (ZOFRAN) IVPB 8 mg      dexamethasone (DECADRON) injection 10 mg      DOXOrubicin HCL LIPOSOMAL (DOXIL) 66 mg in dextrose 5 % 250 mL chemo infusion      sodium chloride 0.9 % injection 10 mL      heparin lock flush 100 unit/mL      heparin lock flush 100 unit/mL      alteplase (CATHFLO ACTIVASE) injection 2 mg      sodium chloride 0.9 % injection 3 mL      0.9 %  sodium chloride infusion      TREATMENT CONDITIONS 07/23/2014      SCHEDULING COMMUNICATION      ondansetron (ZOFRAN) IVPB 8 mg      dexamethasone (DECADRON) injection 10 mg      DOXOrubicin HCL LIPOSOMAL (DOXIL) 66 mg in dextrose 5 % 250 mL chemo infusion      sodium chloride 0.9 % injection 10 mL      heparin lock flush 100 unit/mL      heparin lock flush 100 unit/mL      alteplase (CATHFLO ACTIVASE) injection 2 mg      sodium chloride 0.9 % injection 3 mL      0.9 %  sodium chloride infusion      TREATMENT CONDITIONS 08/20/2014      SCHEDULING COMMUNICATION      ondansetron (ZOFRAN) IVPB 8 mg      dexamethasone (DECADRON) injection 10 mg      DOXOrubicin HCL  LIPOSOMAL (DOXIL) 66 mg in dextrose 5 % 250 mL chemo infusion      sodium chloride 0.9 % injection 10 mL      heparin lock flush 100 unit/mL      heparin lock flush 100 unit/mL      alteplase (CATHFLO ACTIVASE) injection 2 mg      sodium chloride 0.9 % injection 3 mL      0.9 %  sodium chloride infusion      TREATMENT CONDITIONS    ROS  Past Medical History  Diagnosis Date  . Recurrent sinus infections   . Arthritis   . PONV (postoperative nausea and vomiting)   . GERD (gastroesophageal reflux disease)   . 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 Anxiety     no problems at present  . rt breast ca dx'd 12/2011    breast  . Breast cancer   . 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 Tubal ligation    .  Tubal ligation  1985  . Portacath placement N/A 01/10/2013    Procedure: ULTRASOUND GUIDED PORT-A-CATH INSERTION WITH FLUOROSCOPY;  Surgeon: [name], MD;  Location: [facility];  Service: General;  Laterality: N/A;  . Esophagogastroduodenoscopy N/A 08/19/2013    Procedure: ESOPHAGOGASTRODUODENOSCOPY (EGD);  Surgeon: Garlan Fair, MD;  Location: [facility];  Service: Endoscopy;  Laterality: N/A;  . Total mastectomy Right 02/17/2014    Procedure: PALLIATIVE  RIGHT MASTECTOMY;  Surgeon: [name], MD;  Location: [facility];  Service: General;  Laterality: Right;    has Breast cancer of right female breast s/p palliative mastectomy 02/17/14; GERD (gastroesophageal reflux disease); Epiphora; Rash, skin; Insomnia; Primary cancer of right breast with metastasis to other site; Physical deconditioning; Diarrhea; Focal motor seizure; Lung metastases; Liver metastases; Constipation; Breast cancer metastasized to brain; FTT (failure to thrive) in adult; Weakness; Protein-calorie malnutrition, severe; UTI (urinary tract infection); and Thrush on her problem list.    is allergic to ondansetron  hcl; codeine; and penicillins.    Medication List       This list is accurate as of: 06/08/14 11:59 PM.  Always use your most recent med list.               dexamethasone 4 MG tablet  Commonly known as:  DECADRON  Take 8 mg by mouth daily.     dexamethasone 2 MG tablet  Commonly known as:  DECADRON  Take 2 tabs in AM and one in PM w food daily through JAN 12; on JAN 13 start 2 tabs in AM only; on JAN 20 take one tablet in AM only and continue until otherwise instructed     DSS 100 MG Caps  Take 100 mg by mouth daily.     fluconazole 100 MG tablet  Commonly known as:  DIFLUCAN  Take 1 tablet (100 mg total) by mouth daily.     HYDROmorphone 2 MG tablet  Commonly known as:  DILAUDID  Take 0.5 tablets (1 mg total) by mouth every 4 (four) hours as needed.     levETIRAcetam 1000 MG tablet  Commonly known as:  KEPPRA  Take 1 tablet (1,000 mg total) by mouth every 12 (twelve) hours.     levofloxacin 500 MG tablet  Commonly known as:  LEVAQUIN  Take 1 tablet (500 mg total) by mouth daily.     lidocaine-prilocaine cream  Commonly known as:  EMLA  Apply to affected area once     methylphenidate 5 MG tablet  Commonly known as:  RITALIN  Take 1 tablet (5 mg total) by mouth 2 (two) times daily with breakfast and lunch.     mirtazapine 30 MG tablet  Commonly known as:  REMERON  Take 1 tablet (30 mg total) by mouth at bedtime.     multivitamin with minerals Tabs tablet  Take 1 tablet by mouth daily.     ondansetron 8 MG tablet  Commonly known as:  ZOFRAN  Take 1 tablet (8 mg total) by mouth 2 (two) times daily. Start the day after chemo for 2 days. Then take as needed for nausea or vomiting.     prochlorperazine 10 MG tablet  Commonly known as:  COMPAZINE  Take 1 tablet (10 mg total) by mouth every 6 (six) hours as needed (Nausea or vomiting).     promethazine 25 MG tablet  Commonly known as:  PHENERGAN  Take 1 tablet (25 mg total) by mouth every 6 (six) hours as needed  for nausea  or vomiting.     traZODone 50 MG tablet  Commonly known as:  DESYREL  Take 2 tablets (100 mg total) by mouth at bedtime.     VITAMIN D PO  Take 1 tablet by mouth daily.         PHYSICAL EXAMINATION  Blood pressure 110/70, pulse 105, temperature 100.8 F (38.2 C), temperature source Oral, resp. rate 18, height $RemoveBe'5\' 3"'QDmItPwRu$  (1.6 m), weight 116 lb 12.8 oz (52.98 kg), SpO2 96 %.  Physical Exam  Constitutional: She is oriented to person, place, and time. Vital signs are normal. She appears unhealthy.  HENT:  Head: Normocephalic and atraumatic.  Mouth/Throat: Oropharynx is clear and moist.  Eyes: Conjunctivae and EOM are normal. Pupils are equal, round, and reactive to light. Right eye exhibits no discharge. Left eye exhibits no discharge. No scleral icterus.  Neck: Normal range of motion. Neck supple. No JVD present. No tracheal deviation present. No thyromegaly present.  Cardiovascular: Normal rate, regular rhythm, normal heart sounds and intact distal pulses.   Pulmonary/Chest: Effort normal and breath sounds normal. No respiratory distress. She has no wheezes. She has no rales. She exhibits no tenderness.  Abdominal: Soft. Bowel sounds are normal. She exhibits no distension and no mass. There is no tenderness. There is no rebound and no guarding.  Genitourinary:  No flank pain on exam.  Musculoskeletal: Normal range of motion. She exhibits no edema or tenderness.  Lymphadenopathy:    She has no cervical adenopathy.  Neurological: She is alert and oriented to person, place, and time. Gait normal.  Skin: Skin is warm and dry. No rash noted. No erythema. There is pallor.  Psychiatric: Affect normal.  Nursing note and vitals reviewed.   LABORATORY DATA:. Appointment on 06/08/2014  Component Date Value Ref Range Status  . WBC 06/08/2014 6.3  3.9 - 10.3 10e3/uL Final  . NEUT# 06/08/2014 5.9  1.5 - 6.5 10e3/uL Final  . HGB 06/08/2014 11.2* 11.6 - 15.9 g/dL Final  . HCT  06/08/2014 32.6* 34.8 - 46.6 % Final  . Platelets 06/08/2014 126* 145 - 400 10e3/uL Final  . MCV 06/08/2014 95.3  79.5 - 101.0 fL Final  . MCH 06/08/2014 32.7  25.1 - 34.0 pg Final  . MCHC 06/08/2014 34.4  31.5 - 36.0 g/dL Final  . RBC 06/08/2014 3.42* 3.70 - 5.45 10e6/uL Final  . RDW 06/08/2014 16.5* 11.2 - 14.5 % Final  . lymph# 06/08/2014 0.3* 0.9 - 3.3 10e3/uL Final  . MONO# 06/08/2014 0.1  0.1 - 0.9 10e3/uL Final  . Eosinophils Absolute 06/08/2014 0.1  0.0 - 0.5 10e3/uL Final  . Basophils Absolute 06/08/2014 0.0  0.0 - 0.1 10e3/uL Final  . NEUT% 06/08/2014 92.6* 38.4 - 76.8 % Final  . LYMPH% 06/08/2014 5.4* 14.0 - 49.7 % Final  . MONO% 06/08/2014 0.9  0.0 - 14.0 % Final  . EOS% 06/08/2014 0.8  0.0 - 7.0 % Final  . BASO% 06/08/2014 0.3  0.0 - 2.0 % Final  . Sodium 06/08/2014 137  136 - 145 mEq/L Final  . Potassium 06/08/2014 4.5  3.5 - 5.1 mEq/L Final  . Chloride 06/08/2014 102  98 - 109 mEq/L Final  . CO2 06/08/2014 23  22 - 29 mEq/L Final  . Glucose 06/08/2014 118  70 - 140 mg/dl Final  . BUN 06/08/2014 14.6  7.0 - 26.0 mg/dL Final  . Creatinine 06/08/2014 0.6  0.6 - 1.1 mg/dL Final  . Total Bilirubin 06/08/2014 0.44  0.20 - 1.20 mg/dL  Final  . Alkaline Phosphatase 06/08/2014 94  40 - 150 U/L Final  . AST 06/08/2014 28  5 - 34 U/L Final  . ALT 06/08/2014 54  0 - 55 U/L Final  . Total Protein 06/08/2014 6.1* 6.4 - 8.3 g/dL Final  . Albumin 06/08/2014 2.8* 3.5 - 5.0 g/dL Final  . Calcium 06/08/2014 8.3* 8.4 - 10.4 mg/dL Final  . Anion Gap 06/08/2014 12* 3 - 11 mEq/L Final  . EGFR 06/08/2014 >90  >90 ml/min/1.73 m2 Final   eGFR is calculated using the CKD-EPI Creatinine Equation (2009)  . Glucose 06/08/2014 Negative  Negative mg/dL Final  . Bilirubin (Urine) 06/08/2014 Negative  Negative Final  . Ketones 06/08/2014 Negative  Negative mg/dL Final  . Specific Gravity, Urine 06/08/2014 1.005  1.003 - 1.035 Final  . Blood 06/08/2014 Trace  Negative Final  . pH 06/08/2014 7.0  4.6  - 8.0 Final  . Protein 06/08/2014 Negative  Negative- <30 mg/dL Final  . Urobilinogen, UR 06/08/2014 0.2  0.2 - 1 mg/dL Final  . Nitrite 06/08/2014 Negative  Negative Final  . Leukocyte Esterase 06/08/2014 Moderate  Negative Final  . RBC / HPF 06/08/2014 7-10  0 - 2 Final  . WBC, UA 06/08/2014 TNTC  0 - 2 Final  . Bacteria, UA 06/08/2014 Many  Negative- Trace Final  . Crystals 06/08/2014 Ca Oxalate  Negative Final  . Epithelial Cells 06/08/2014 Few  Negative- Few Final     RADIOGRAPHIC STUDIES: No results found.  ASSESSMENT/PLAN:    Breast cancer of right female breast s/p palliative mastectomy 02/17/14 Patient is status post whole brain radiation completed in November 2015.  She received cycle one of her Doxil chemotherapy on 05/28/2014.  Patient scheduled to return for labs and a follow-up visit on 06/23/2014.  She is scheduled to return for cycle 2 of the same regimen on 06/25/2014.   UTI (urinary tract infection) Patient completed Cipro antibiotics on 06/02/2014 for a total of 5 days of treatment for previously diagnosed UTI last week.  She continues to complain of dysuria, frequency, and without odor to her urine.  Patient denies any vaginal discharge or flank pain.  Patient does have a temperature of 100.8 while at the [facility] today.  Urinalysis obtained today did reveal recurrent UTI.  Previous urine culture obtained in early January 2016 revealed that culture was sensitive to Levaquin.  Will try Levaquin antibiotics for a total of 10 days; pending urine culture results.  Advised both patient and her husband to call/return if she develops any worsening symptoms whatsoever.   Patient stated understanding of all instructions; and was in agreement with this plan of care. The patient knows to call the clinic with any problems, questions or concerns.   Review/collaboration with Dr. [name]  regarding all aspects of patient's visit today.   Total time spent with patient was 25  minutes;  with greater than 75 percent of that time spent in face to face counseling regarding her symptoms, and coordination of care and follow up.  Disclaimer: This note was dictated with voice recognition software. Similar sounding words can inadvertently be transcribed and may not be corrected upon review.   Drue Second, NP 06/09/2014

## 2014-06-11 LAB — URINE CULTURE

## 2014-06-23 ENCOUNTER — Other Ambulatory Visit: Payer: Self-pay | Admitting: Oncology

## 2014-06-23 ENCOUNTER — Other Ambulatory Visit (HOSPITAL_BASED_OUTPATIENT_CLINIC_OR_DEPARTMENT_OTHER): Payer: BC Managed Care – PPO

## 2014-06-23 ENCOUNTER — Telehealth: Payer: Self-pay | Admitting: Oncology

## 2014-06-23 ENCOUNTER — Ambulatory Visit (HOSPITAL_BASED_OUTPATIENT_CLINIC_OR_DEPARTMENT_OTHER): Payer: BC Managed Care – PPO | Admitting: Oncology

## 2014-06-23 VITALS — BP 103/56 | HR 97 | Temp 99.0°F | Resp 18 | Ht 63.0 in | Wt 125.9 lb

## 2014-06-23 DIAGNOSIS — Z171 Estrogen receptor negative status [ER-]: Secondary | ICD-10-CM

## 2014-06-23 DIAGNOSIS — C7800 Secondary malignant neoplasm of unspecified lung: Secondary | ICD-10-CM

## 2014-06-23 DIAGNOSIS — C50919 Malignant neoplasm of unspecified site of unspecified female breast: Secondary | ICD-10-CM

## 2014-06-23 DIAGNOSIS — C787 Secondary malignant neoplasm of liver and intrahepatic bile duct: Secondary | ICD-10-CM

## 2014-06-23 DIAGNOSIS — C50411 Malignant neoplasm of upper-outer quadrant of right female breast: Secondary | ICD-10-CM

## 2014-06-23 DIAGNOSIS — D0590 Unspecified type of carcinoma in situ of unspecified breast: Secondary | ICD-10-CM

## 2014-06-23 DIAGNOSIS — C50911 Malignant neoplasm of unspecified site of right female breast: Secondary | ICD-10-CM

## 2014-06-23 DIAGNOSIS — C7801 Secondary malignant neoplasm of right lung: Secondary | ICD-10-CM

## 2014-06-23 DIAGNOSIS — K5900 Constipation, unspecified: Secondary | ICD-10-CM

## 2014-06-23 DIAGNOSIS — C7931 Secondary malignant neoplasm of brain: Secondary | ICD-10-CM

## 2014-06-23 DIAGNOSIS — E46 Unspecified protein-calorie malnutrition: Secondary | ICD-10-CM

## 2014-06-23 DIAGNOSIS — R627 Adult failure to thrive: Secondary | ICD-10-CM

## 2014-06-23 DIAGNOSIS — R222 Localized swelling, mass and lump, trunk: Secondary | ICD-10-CM

## 2014-06-23 LAB — CBC WITH DIFFERENTIAL/PLATELET
BASO%: 0.6 % (ref 0.0–2.0)
Basophils Absolute: 0 10*3/uL (ref 0.0–0.1)
EOS%: 0.3 % (ref 0.0–7.0)
Eosinophils Absolute: 0 10*3/uL (ref 0.0–0.5)
HCT: 33.9 % — ABNORMAL LOW (ref 34.8–46.6)
HGB: 10.9 g/dL — ABNORMAL LOW (ref 11.6–15.9)
LYMPH%: 19.7 % (ref 14.0–49.7)
MCH: 32.2 pg (ref 25.1–34.0)
MCHC: 32.2 g/dL (ref 31.5–36.0)
MCV: 100 fL (ref 79.5–101.0)
MONO#: 1.1 10*3/uL — ABNORMAL HIGH (ref 0.1–0.9)
MONO%: 15.8 % — ABNORMAL HIGH (ref 0.0–14.0)
NEUT#: 4.5 10*3/uL (ref 1.5–6.5)
NEUT%: 63.6 % (ref 38.4–76.8)
PLATELETS: 225 10*3/uL (ref 145–400)
RBC: 3.39 10*6/uL — ABNORMAL LOW (ref 3.70–5.45)
RDW: 19.7 % — ABNORMAL HIGH (ref 11.2–14.5)
WBC: 7.1 10*3/uL (ref 3.9–10.3)
lymph#: 1.4 10*3/uL (ref 0.9–3.3)

## 2014-06-23 LAB — COMPREHENSIVE METABOLIC PANEL (CC13)
ALBUMIN: 3.1 g/dL — AB (ref 3.5–5.0)
ALT: 23 U/L (ref 0–55)
ANION GAP: 9 meq/L (ref 3–11)
AST: 24 U/L (ref 5–34)
Alkaline Phosphatase: 73 U/L (ref 40–150)
BUN: 17.2 mg/dL (ref 7.0–26.0)
CHLORIDE: 105 meq/L (ref 98–109)
CO2: 26 meq/L (ref 22–29)
CREATININE: 0.6 mg/dL (ref 0.6–1.1)
Calcium: 8.8 mg/dL (ref 8.4–10.4)
GLUCOSE: 107 mg/dL (ref 70–140)
POTASSIUM: 3.7 meq/L (ref 3.5–5.1)
Sodium: 140 mEq/L (ref 136–145)
Total Bilirubin: 0.26 mg/dL (ref 0.20–1.20)
Total Protein: 5.8 g/dL — ABNORMAL LOW (ref 6.4–8.3)

## 2014-06-23 LAB — TECHNOLOGIST REVIEW

## 2014-06-23 MED ORDER — FLUCONAZOLE 100 MG PO TABS: 100.0000 mg | ORAL_TABLET | Freq: Every day | ORAL | Status: DC

## 2014-06-23 MED ORDER — HYDROMORPHONE HCL 2 MG PO TABS: 1.0000 mg | ORAL_TABLET | ORAL | Status: DC | PRN

## 2014-06-23 MED ORDER — METHYLPHENIDATE HCL 5 MG PO TABS: 5.0000 mg | ORAL_TABLET | Freq: Two times a day (BID) | ORAL | Status: DC

## 2014-06-23 MED ORDER — ACYCLOVIR 400 MG PO TABS
400.0000 mg | ORAL_TABLET | Freq: Every day | ORAL | Status: DC
Start: 2014-06-23 — End: 2014-07-22

## 2014-06-23 NOTE — Telephone Encounter (Signed)
per pof to sch pt appt-CS to sch MRI-pt to get updated sch on 2/11 appt

## 2014-06-23 NOTE — Progress Notes (Signed)
. Patient ID: Carrie Berry, female   DOB: July 03, 1950, 64 y.o.   MRN: [id number] ID: [name] OB: 1950-12-23  MR#: [id number]  CSN#:[id number]  PCP: [name], MD GYN:   SU: [name] OTHER MD: Thea Silversmith, Earle Gell, Christene Slates, Heath Gold  CHIEF COMPLAINT:  Metastatic Breast Cancer CURRENT TREATMENT: Doxil  BREAST CANCER HISTORY: From the originally intake note:  [name] noted a mass in her right breast December of 2013, but did not think much of it. It did grow some lower the summer, but she was keeping her grandchild at that time and was too busy so she did not bring it to Dr. Bernell List attention until August. He set her up for bilateral mammography and ultrasonography at [facility] 12/18/2012 and this measured, on the right, a large irregular lobulated mass in the upper outer quadrant which by ultrasound measured 5.7 cm. The right axilla showed some lymph nodes with thickened cortices. In the left breast there was a focal asymmetry at the depth, lateral to the nipple, and by ultrasound there was a 9 mm ill-defined hypoechoic lesion in this location. Biopsy of the left breast lesion showed only fibrocystic changes.  Biopsy of the right breast mass and right axillary adenopathy ([id number]) on 12/23/2012 showed both to be involved by invasive ductal carcinoma, grade 2, triple negative, with an MIB-1 of 86%.  MRI of the breast 12/27/2012 at [facility] imaging showed in the right breast a mass abutting the pectoralis muscle without enhancement of the muscle measuring 4.8 cm. The satellite nodule measuring approximately 3 mm was also noted superior to the mass and several abnormal and enlarged right axillary lymph nodes were noted, one of which appeared to be necrotic. The largest node measured 2.0 cm. Unfortunately, numerous bilateral pulmonary nodules were also noted.  The patient's subsequent history is as detailed below   INTERVAL HISTORY: Carrie Berry returns today  for follow-up of her metastatic breast cancer, accompanied by her husband [name]. Today is day 27 cycle 1 of Doxil, given every 28 days. She tolerated the first cycle generally well, and particularly she did not develop any significant oh palmar or plantar erythrodysesthesia. She has had significant mouth discomfort which she says is now better. She had mild nausea problems which she took care with Compazine.  REVIEW OF SYSTEMS [name] has had persistent problems with thrush, and only stopped taking Diflucan about 3 days ago. She is not on acyclovir. She stays pretty much in the wheelchair at home. Her sense of taste is altered and mic keeps changing things that he cooks out to adjust to her new tastes. He wishes she ate more and exercise more and drank more. He volunteered MRI of symptoms which she had not mentioned or minimized, and she finally told them that she needed to speak for herself. She tells me she has had a few headaches, but are well controlled with Tylenol. She denies dizziness or falls. There has been no constipation diarrhea or other changes in bowel or bladder habits. She is currently on 2 mg of dexamethasone in the morning. Pain is well-controlled with the occasional dye loaded tablet a detailed review of systems today was stable except as noted  PAST MEDICAL HISTORY: Past Medical History  Diagnosis Date  . Recurrent sinus infections   . Arthritis   . PONV (postoperative nausea and vomiting)   . GERD (gastroesophageal reflux disease)   . 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 Anxiety  no problems at present  . rt breast ca dx'd 12/2011    breast  . Breast cancer   . 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 Tubal ligation    . Tubal ligation  1985  . Portacath placement N/A 01/10/2013    Procedure: ULTRASOUND GUIDED PORT-A-CATH INSERTION WITH FLUOROSCOPY;   Surgeon: [name], MD;  Location: [facility];  Service: General;  Laterality: N/A;  . Esophagogastroduodenoscopy N/A 08/19/2013    Procedure: ESOPHAGOGASTRODUODENOSCOPY (EGD);  Surgeon: Garlan Fair, MD;  Location: [facility];  Service: Endoscopy;  Laterality: N/A;  . Total mastectomy Right 02/17/2014    Procedure: PALLIATIVE  RIGHT MASTECTOMY;  Surgeon: [name], MD;  Location: [facility];  Service: General;  Laterality: Right;    FAMILY HISTORY Family History  Problem Relation Age of Onset  . Bladder Cancer Father   . Colon cancer Maternal Grandmother    the patient's parents are living, both in their 26s. The patient's father was diagnosed with bladder cancer the age of 102. The patient's mother's mother had some type of gastrointestinal cancer. The patient had one brother, no sisters. There is no history of breast or ovarian cancer in the family other than a cousin on the father's side who was diagnosed with breast cancer apparently before the age of 54.  GYNECOLOGIC HISTORY:   (Reviewed 10/13/2013) Menarche age 89, first live birth age 47, the patient is [place] P2. She went through menopause in 2008. She did not take hormone replacement. She took birth control for approximately 22 years remotely.  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 She's currently on disability. Her husband Jenell Dobransky [name]"), is vice Development worker, international aid for [employer]. The patient's daughter Carrie Berry is a stay-at-home mom in [place], the patient's son Carrie Berry unfortunately died in an automobile accident in December of 2011. The patient has 2 grandchildren. She attends to a local [employer].    ADVANCED DIRECTIVES: Not in place   HEALTH MAINTENANCE: (Updated 10/13/2013) History  Substance Use Topics  . Smoking status: Never Smoker   . Smokeless tobacco: Never Used  . Alcohol  Use: No     Colonoscopy: 2005  PAP: Not on file  Bone density: Not on file  Lipid panel:  Not on file/Dr. Polite   Allergies  Allergen Reactions  . Ondansetron Hcl Other (See Comments)    HEADACHE, patient states she still takes   . Codeine Nausea And Vomiting    "violently ill"  . Penicillins Rash    Childhood reaction -    Current Outpatient Prescriptions  Medication Sig Dispense Refill  . Cholecalciferol (VITAMIN D PO) Take 1 tablet by mouth daily.    Marland Kitchen dexamethasone (DECADRON) 2 MG tablet Take 2 tabs in AM and one in PM w food daily through [place]; on JAN 13 start 2 tabs in AM only; on JAN 20 take one tablet in AM only and continue until otherwise instructed 60 tablet 4  . dexamethasone (DECADRON) 4 MG tablet Take 8 mg by mouth daily.    Marland Kitchen docusate sodium 100 MG CAPS Take 100 mg by mouth daily. (Patient taking differently: Take 200 mg by mouth daily. ) 10 capsule 0  . fluconazole (DIFLUCAN) 100 MG tablet Take 1 tablet (100 mg total) by mouth daily.  30 tablet 4  . HYDROmorphone (DILAUDID) 2 MG tablet Take 0.5 tablets (1 mg total) by mouth every 4 (four) hours as needed. 10 tablet 0  . levETIRAcetam (KEPPRA) 1000 MG tablet Take 1 tablet (1,000 mg total) by mouth every 12 (twelve) hours. 60 tablet 4  . levofloxacin (LEVAQUIN) 500 MG tablet Take 1 tablet (500 mg total) by mouth daily. 10 tablet 0  . lidocaine-prilocaine (EMLA) cream Apply to affected area once 30 g 3  . methylphenidate (RITALIN) 5 MG tablet Take 1 tablet (5 mg total) by mouth 2 (two) times daily with breakfast and lunch. 60 tablet 0  . mirtazapine (REMERON) 30 MG tablet Take 1 tablet (30 mg total) by mouth at bedtime. 30 tablet 1  . Multiple Vitamin (MULTIVITAMIN WITH MINERALS) TABS tablet Take 1 tablet by mouth daily.    . ondansetron (ZOFRAN) 8 MG tablet Take 1 tablet (8 mg total) by mouth 2 (two) times daily. Start the day after chemo for 2 days. Then take as needed for nausea or vomiting. 30 tablet 1  .  prochlorperazine (COMPAZINE) 10 MG tablet TAKE 1 TABLET (10 MG TOTAL) BY MOUTH EVERY 6 (SIX) HOURS AS NEEDED (NAUSEA OR VOMITING). 30 tablet 1  . promethazine (PHENERGAN) 25 MG tablet Take 1 tablet (25 mg total) by mouth every 6 (six) hours as needed for nausea or vomiting. 15 tablet 0  . traZODone (DESYREL) 50 MG tablet Take 2 tablets (100 mg total) by mouth at bedtime. 60 tablet 0   No current facility-administered medications for this visit.    OBJECTIVE: Middle-aged white woman examined in a wheelchair Filed Vitals:   06/23/14 1315  BP: 103/56  Pulse: 97  Temp: 99 F (37.2 C)  Resp: 18     Body mass index is 22.31 kg/(m^2).    ECOG FS: 2 Filed Weights   06/23/14 1315  Weight: 125 lb 14.4 oz (57.108 kg)    Sclerae unicteric, pupils equal and reactive, EOMs intact Oropharynx shows thrush, no other lesions No cervical or supraclavicular adenopathy Lungs no rales or rhonchi Heart regular rate and rhythm Abd soft, nontender, positive bowel sounds MSK no focal spinal tenderness, no upper extremity lymphedema Neuro: nonfocal, well oriented, appropriate affect Breasts: Deferred    Image taken 06/04/14     LAB RESULTS:   Lab Results  Component Value Date   WBC 7.1 06/23/2014   NEUTROABS 4.5 06/23/2014   HGB 10.9* 06/23/2014   HCT 33.9* 06/23/2014   MCV 100.0 06/23/2014   PLT 225 06/23/2014      Chemistry      Component Value Date/Time   NA 140 06/23/2014 1255   NA 134* 05/20/2014 1114   K 3.7 06/23/2014 1255   K 3.7 05/20/2014 1114   CL 99 05/20/2014 1114   CO2 26 06/23/2014 1255   CO2 24 05/20/2014 1114   BUN 17.2 06/23/2014 1255   BUN 15 05/20/2014 1114   CREATININE 0.6 06/23/2014 1255   CREATININE 0.35* 05/20/2014 1114      Component Value Date/Time   CALCIUM 8.8 06/23/2014 1255   CALCIUM 8.4 05/20/2014 1114   ALKPHOS 73 06/23/2014 1255   ALKPHOS 69 05/20/2014 1114   AST 24 06/23/2014 1255   AST 30 05/20/2014 1114   ALT 23 06/23/2014 1255   ALT 28  05/20/2014 1114   BILITOT 0.26 06/23/2014 1255   BILITOT 0.5 05/20/2014 1114        STUDIES: Mr Liver W Wo Contrast  Jun 29, 2014  CLINICAL DATA:  Followup metastatic breast carcinoma.  EXAM: MRI ABDOMEN WITHOUT AND WITH CONTRAST  TECHNIQUE: Multiplanar multisequence MR imaging of the abdomen was performed both before and after the administration of intravenous contrast.  CONTRAST:  36mL MULTIHANCE GADOBENATE DIMEGLUMINE 529 MG/ML IV SOLN  COMPARISON:  CT on 02/28/2014 and MRI on 12/04/2013  FINDINGS: Lower chest:  Unremarkable.  Hepatobiliary: Multiple new liver metastases are seen in the medial and lateral segments of the left hepatic lobe, with largest measuring 2.3 cm on image 29/series 1102. A new metastasis is seen in the anterior segment right lobe measuring 1.4 cm on image 34/series 1102. A few other tiny sub-cm lesions in the right hepatic lobe remain stable.  Pancreas: No cystic or solid masses identified. No peripancreatic inflammatory changes or fluid collections demonstrated.  Spleen:  Within normal limits in size and appearance.  Adrenal Glands:  No masses identified.  Kidneys: No complex cystic or solid masses identified. No evidence of urolithiasis or hydronephrosis.  Stomach/Bowel/Peritoneum: No evidence of wall thickening, mass, or obstruction involving visualized abdominal bowel.  Vascular/Lymphatic: No pathologically enlarged lymph nodes identified. No other significant abnormality noted.  Other:  None.  Musculoskeletal:  No suspicious bone lesions identified.  IMPRESSION: Further interval progression of liver metastases compared with prior exams.  No other sites of metastatic disease identified within the abdomen.   Electronically Signed   By: Earle Gell M.D.   On: 06/03/2014 17:01      ASSESSMENT: 64 y.o. [name], [place] woman status post right breast upper outer quadrant and right axillary lymph node biopsy 12/23/2012 for a clinical T3 N1 M1, stage IV invasive ductal carcinoma, grade  3, triple negative, with an MIB-1 of 86%.  (1) right liver lobe biopsy 01/21/2013 confirms metastatic adenocarcinoma; scans show involvement of the liver, lungs, and likely bone.  (2) enrolled in [facility] study [id number] (docetaxel + oral gamma secretase inibitor [id number]), received one cycle starting 02/04/2013  but withdrew because of poor tolerance despite treatment interruptions and decreased dosing of the oral component  (3)  chest CT in early November 2014 was compared with studies in August 2014 and showed interval progression of metastatic breast cancer, with an enlarging primary right breast mass, enlarging right axillary lymph nodes, enlarging pulmonary nodules and new/enlarging hepatic metastases.  (4) Abraxane started 03/19/2013, given day 1 and day 8 of each 21 day cycle, stopped with the day 1 cycle 3 dose (04/29/2013) because of local progression of disease  (5) cyclophosphamide and doxorubicin started 05/20/2013, given in dose dense fashion  with Neulasta support on day 2. Completed 4 cycles 07/03/2013, with the final cycle dose reduced 15% because of an episode of febrile neutropenia after cycle 3. Restaging studies 07/15/2013 showed partial response.  (6) started capecitabine April 2015, 1.5 g by mouth twice a day, 7 days on 7 days off--discontinued October 2015 with progression  (7) s/p Right simple mastectomy 02/17/2014 to optimize local control, the pathology ([facility] [id number]) showing an invasive ductal carcinoma measuring 4.8 cm, grade 3, with close but negative margins and evidence of treatment response   (a) nodule on right mastectomy scar noted 04/03/2014, removed 04/14/2014  (b) new nodule noted on R chest scar 05/19/2014  (c) considering radiation to R chest wall  (8) multiple brain metastases noted on brain MRI 02/26/2014--s/p whole brain irradiation completed 03/17/2014  (a) repeat brain MRI 05/12/2014 shows favorable response  (9) protein-calorie malnutrition  (10)  high fall risk  (11) advanced directives: not in place; husband is  healthcare power of attorney  (12) unexplained paroxysmal knee pain: possible ruptured Baker's cyst, Left, 05/01/2014  (13) doxil to start after chest-wall irradiation (target start date 06/23/2014)   PLAN: Iliyah is working hard at keeping herself as fit as possible as long as possible, and at trying to keep things positive. [name] is doing a great job of supporting her. Inadvertently there are going to be some conflicts because she is a very independent woman and he is a Investment banker, corporate. Nevertheless overall they're doing a terrific job at keeping her going  She is tolerating the Doxil well. She will receive her second dose in 2 days. I am going to ask her to take Diflucan daily indefinitely and I have added acyclovir 400 mg to take twice a day. I'm hopeful that we'll clear most of the mouth issues and allow her to enjoy her food and drink more. If things remain poor as far as oral hygiene is concerned when she returns 2 weeks from now we can add chlorhexidine rinses.  She is going to return to see me March 9 before her third dose of Doxil March 10. On March 7 she will have a repeat brain MRI. I am not repeating a liver MRI until she has completed 4 cycles of Doxil.  Zigmund Daniel and [name] have a good understanding of the overall plan. They agree with it. They know the goal of treatment in this case is control. They will call with any problems that may develop before her next visit here.  Chauncey Cruel, MD   06/23/2014 1:40 PM

## 2014-06-24 ENCOUNTER — Other Ambulatory Visit: Payer: Self-pay | Admitting: *Deleted

## 2014-06-24 DIAGNOSIS — C50911 Malignant neoplasm of unspecified site of right female breast: Secondary | ICD-10-CM

## 2014-06-24 DIAGNOSIS — C50411 Malignant neoplasm of upper-outer quadrant of right female breast: Secondary | ICD-10-CM

## 2014-06-24 NOTE — Addendum Note (Signed)
Addended by: Laureen Abrahams on: 06/24/2014 05:26 PM   Modules accepted: Orders, Medications

## 2014-06-25 ENCOUNTER — Ambulatory Visit (HOSPITAL_BASED_OUTPATIENT_CLINIC_OR_DEPARTMENT_OTHER): Payer: BC Managed Care – PPO

## 2014-06-25 ENCOUNTER — Other Ambulatory Visit (HOSPITAL_BASED_OUTPATIENT_CLINIC_OR_DEPARTMENT_OTHER): Payer: BC Managed Care – PPO

## 2014-06-25 ENCOUNTER — Other Ambulatory Visit: Payer: Self-pay | Admitting: Oncology

## 2014-06-25 DIAGNOSIS — C7800 Secondary malignant neoplasm of unspecified lung: Secondary | ICD-10-CM

## 2014-06-25 DIAGNOSIS — C50411 Malignant neoplasm of upper-outer quadrant of right female breast: Secondary | ICD-10-CM

## 2014-06-25 DIAGNOSIS — C7931 Secondary malignant neoplasm of brain: Principal | ICD-10-CM

## 2014-06-25 DIAGNOSIS — C50911 Malignant neoplasm of unspecified site of right female breast: Secondary | ICD-10-CM

## 2014-06-25 DIAGNOSIS — C7801 Secondary malignant neoplasm of right lung: Secondary | ICD-10-CM

## 2014-06-25 DIAGNOSIS — C787 Secondary malignant neoplasm of liver and intrahepatic bile duct: Secondary | ICD-10-CM

## 2014-06-25 DIAGNOSIS — C773 Secondary and unspecified malignant neoplasm of axilla and upper limb lymph nodes: Secondary | ICD-10-CM

## 2014-06-25 DIAGNOSIS — Z5111 Encounter for antineoplastic chemotherapy: Secondary | ICD-10-CM

## 2014-06-25 LAB — COMPREHENSIVE METABOLIC PANEL (CC13)
ALT: 22 U/L (ref 0–55)
ANION GAP: 11 meq/L (ref 3–11)
AST: 23 U/L (ref 5–34)
Albumin: 3.3 g/dL — ABNORMAL LOW (ref 3.5–5.0)
Alkaline Phosphatase: 77 U/L (ref 40–150)
BUN: 13.7 mg/dL (ref 7.0–26.0)
CHLORIDE: 104 meq/L (ref 98–109)
CO2: 27 mEq/L (ref 22–29)
Calcium: 9.2 mg/dL (ref 8.4–10.4)
Creatinine: 0.7 mg/dL (ref 0.6–1.1)
GLUCOSE: 114 mg/dL (ref 70–140)
POTASSIUM: 3.8 meq/L (ref 3.5–5.1)
Sodium: 143 mEq/L (ref 136–145)
TOTAL PROTEIN: 6.2 g/dL — AB (ref 6.4–8.3)
Total Bilirubin: 0.31 mg/dL (ref 0.20–1.20)

## 2014-06-25 LAB — CBC WITH DIFFERENTIAL/PLATELET
BASO%: 0.7 % (ref 0.0–2.0)
Basophils Absolute: 0.1 10*3/uL (ref 0.0–0.1)
EOS%: 0.2 % (ref 0.0–7.0)
Eosinophils Absolute: 0 10*3/uL (ref 0.0–0.5)
HEMATOCRIT: 36.6 % (ref 34.8–46.6)
HEMOGLOBIN: 11.9 g/dL (ref 11.6–15.9)
LYMPH#: 1.3 10*3/uL (ref 0.9–3.3)
LYMPH%: 16 % (ref 14.0–49.7)
MCH: 32.5 pg (ref 25.1–34.0)
MCHC: 32.5 g/dL (ref 31.5–36.0)
MCV: 100 fL (ref 79.5–101.0)
MONO#: 0.9 10*3/uL (ref 0.1–0.9)
MONO%: 11.5 % (ref 0.0–14.0)
NEUT#: 5.8 10*3/uL (ref 1.5–6.5)
NEUT%: 71.6 % (ref 38.4–76.8)
PLATELETS: 238 10*3/uL (ref 145–400)
RBC: 3.66 10*6/uL — ABNORMAL LOW (ref 3.70–5.45)
RDW: 19.5 % — ABNORMAL HIGH (ref 11.2–14.5)
WBC: 8.2 10*3/uL (ref 3.9–10.3)

## 2014-06-25 LAB — TECHNOLOGIST REVIEW

## 2014-06-25 MED ORDER — DEXAMETHASONE SODIUM PHOSPHATE 10 MG/ML IJ SOLN
INTRAMUSCULAR | Status: AC
  Filled 2014-06-25: qty 1

## 2014-06-25 MED ORDER — HEPARIN SOD (PORK) LOCK FLUSH 100 UNIT/ML IV SOLN
500.0000 [IU] | Freq: Once | INTRAVENOUS | Status: AC | PRN
  Administered 2014-06-25: 500 [IU]
  Filled 2014-06-25: qty 5

## 2014-06-25 MED ORDER — ONDANSETRON 8 MG/NS 50 ML IVPB
INTRAVENOUS | Status: AC
Start: 2014-06-25 — End: 2014-06-25
  Filled 2014-06-25: qty 8

## 2014-06-25 MED ORDER — DEXAMETHASONE SODIUM PHOSPHATE 10 MG/ML IJ SOLN
10.0000 mg | Freq: Once | INTRAMUSCULAR | Status: AC
  Administered 2014-06-25: 10 mg via INTRAVENOUS

## 2014-06-25 MED ORDER — SODIUM CHLORIDE 0.9 % IV SOLN
Freq: Once | INTRAVENOUS | Status: AC
  Administered 2014-06-25: 12:00:00 via INTRAVENOUS

## 2014-06-25 MED ORDER — DOXORUBICIN HCL LIPOSOMAL CHEMO INJECTION 2 MG/ML
45.0000 mg/m2 | Freq: Once | INTRAVENOUS | Status: AC
  Administered 2014-06-25: 66 mg via INTRAVENOUS
  Filled 2014-06-25: qty 33

## 2014-06-25 MED ORDER — SODIUM CHLORIDE 0.9 % IJ SOLN
10.0000 mL | INTRAMUSCULAR | Status: DC | PRN
  Administered 2014-06-25: 10 mL
  Filled 2014-06-25: qty 10

## 2014-06-25 MED ORDER — ONDANSETRON 8 MG/50ML IVPB (CHCC)
8.0000 mg | Freq: Once | INTRAVENOUS | Status: AC
  Administered 2014-06-25: 8 mg via INTRAVENOUS

## 2014-06-26 ENCOUNTER — Encounter (HOSPITAL_COMMUNITY): Payer: Self-pay

## 2014-06-26 ENCOUNTER — Ambulatory Visit (HOSPITAL_COMMUNITY)
Admission: RE | Admit: 2014-06-26 | Discharge: 2014-06-26 | Disposition: A | Discharge status: Home / Self Care | Payer: BC Managed Care – PPO | Source: Ambulatory Visit | Attending: Oncology | Admitting: Oncology

## 2014-06-26 DIAGNOSIS — C7931 Secondary malignant neoplasm of brain: Secondary | ICD-10-CM | POA: Diagnosis not present

## 2014-06-26 DIAGNOSIS — Z01810 Encounter for preprocedural cardiovascular examination: Secondary | ICD-10-CM | POA: Diagnosis present

## 2014-06-26 DIAGNOSIS — K5900 Constipation, unspecified: Secondary | ICD-10-CM | POA: Diagnosis not present

## 2014-06-26 DIAGNOSIS — C7801 Secondary malignant neoplasm of right lung: Secondary | ICD-10-CM | POA: Insufficient documentation

## 2014-06-26 DIAGNOSIS — C787 Secondary malignant neoplasm of liver and intrahepatic bile duct: Secondary | ICD-10-CM | POA: Insufficient documentation

## 2014-06-26 DIAGNOSIS — R627 Adult failure to thrive: Secondary | ICD-10-CM | POA: Insufficient documentation

## 2014-06-26 DIAGNOSIS — C50919 Malignant neoplasm of unspecified site of unspecified female breast: Secondary | ICD-10-CM

## 2014-06-26 DIAGNOSIS — C7800 Secondary malignant neoplasm of unspecified lung: Secondary | ICD-10-CM

## 2014-06-26 DIAGNOSIS — Z08 Encounter for follow-up examination after completed treatment for malignant neoplasm: Secondary | ICD-10-CM

## 2014-06-26 DIAGNOSIS — D0590 Unspecified type of carcinoma in situ of unspecified breast: Secondary | ICD-10-CM

## 2014-06-26 DIAGNOSIS — C50411 Malignant neoplasm of upper-outer quadrant of right female breast: Secondary | ICD-10-CM

## 2014-06-26 DIAGNOSIS — C50911 Malignant neoplasm of unspecified site of right female breast: Secondary | ICD-10-CM | POA: Insufficient documentation

## 2014-06-26 MED ORDER — SODIUM CHLORIDE 0.9 % IV SOLN
INTRAVENOUS | Status: DC
  Administered 2014-06-26: 500 mL via INTRAVENOUS
  Filled 2014-06-26: qty 1000

## 2014-06-26 MED ORDER — PERFLUTREN LIPID MICROSPHERE
1.0000 mL | INTRAVENOUS | Status: AC | PRN
  Administered 2014-06-26: 1.8 mL via INTRAVENOUS
  Filled 2014-06-26: qty 10

## 2014-06-26 NOTE — Progress Notes (Signed)
Echocardiogram 2D Echocardiogram with Definity has been performed.  Carrie Berry 06/26/2014, 12:53 PM

## 2014-06-30 ENCOUNTER — Ambulatory Visit (HOSPITAL_BASED_OUTPATIENT_CLINIC_OR_DEPARTMENT_OTHER): Payer: BC Managed Care – PPO | Admitting: Nurse Practitioner

## 2014-06-30 ENCOUNTER — Telehealth: Payer: Self-pay | Admitting: Nurse Practitioner

## 2014-06-30 ENCOUNTER — Other Ambulatory Visit (HOSPITAL_BASED_OUTPATIENT_CLINIC_OR_DEPARTMENT_OTHER): Payer: BC Managed Care – PPO

## 2014-06-30 ENCOUNTER — Encounter: Payer: Self-pay | Admitting: Nurse Practitioner

## 2014-06-30 VITALS — BP 104/61 | HR 99 | Temp 98.7°F | Resp 18 | Ht 63.0 in | Wt 125.7 lb

## 2014-06-30 DIAGNOSIS — M545 Low back pain: Secondary | ICD-10-CM

## 2014-06-30 DIAGNOSIS — C50911 Malignant neoplasm of unspecified site of right female breast: Secondary | ICD-10-CM

## 2014-06-30 DIAGNOSIS — L989 Disorder of the skin and subcutaneous tissue, unspecified: Secondary | ICD-10-CM

## 2014-06-30 DIAGNOSIS — R748 Abnormal levels of other serum enzymes: Secondary | ICD-10-CM

## 2014-06-30 DIAGNOSIS — C7800 Secondary malignant neoplasm of unspecified lung: Secondary | ICD-10-CM

## 2014-06-30 DIAGNOSIS — C7931 Secondary malignant neoplasm of brain: Secondary | ICD-10-CM

## 2014-06-30 DIAGNOSIS — C773 Secondary and unspecified malignant neoplasm of axilla and upper limb lymph nodes: Secondary | ICD-10-CM

## 2014-06-30 DIAGNOSIS — C787 Secondary malignant neoplasm of liver and intrahepatic bile duct: Secondary | ICD-10-CM

## 2014-06-30 DIAGNOSIS — C50411 Malignant neoplasm of upper-outer quadrant of right female breast: Secondary | ICD-10-CM

## 2014-06-30 LAB — CBC WITH DIFFERENTIAL/PLATELET
BASO%: 0.3 % (ref 0.0–2.0)
Basophils Absolute: 0 10*3/uL (ref 0.0–0.1)
EOS ABS: 0 10*3/uL (ref 0.0–0.5)
EOS%: 0.3 % (ref 0.0–7.0)
HCT: 37 % (ref 34.8–46.6)
HGB: 12 g/dL (ref 11.6–15.9)
LYMPH%: 11 % — AB (ref 14.0–49.7)
MCH: 32.7 pg (ref 25.1–34.0)
MCHC: 32.4 g/dL (ref 31.5–36.0)
MCV: 100.8 fL (ref 79.5–101.0)
MONO#: 1 10*3/uL — ABNORMAL HIGH (ref 0.1–0.9)
MONO%: 8.1 % (ref 0.0–14.0)
NEUT%: 80.3 % — ABNORMAL HIGH (ref 38.4–76.8)
NEUTROS ABS: 9.4 10*3/uL — AB (ref 1.5–6.5)
Platelets: 212 10*3/uL (ref 145–400)
RBC: 3.67 10*6/uL — AB (ref 3.70–5.45)
RDW: 18.9 % — ABNORMAL HIGH (ref 11.2–14.5)
WBC: 11.7 10*3/uL — ABNORMAL HIGH (ref 3.9–10.3)
lymph#: 1.3 10*3/uL (ref 0.9–3.3)

## 2014-06-30 LAB — COMPREHENSIVE METABOLIC PANEL (CC13)
ALK PHOS: 92 U/L (ref 40–150)
ALT: 76 U/L — ABNORMAL HIGH (ref 0–55)
AST: 63 U/L — AB (ref 5–34)
Albumin: 3.3 g/dL — ABNORMAL LOW (ref 3.5–5.0)
Anion Gap: 10 mEq/L (ref 3–11)
BUN: 17.2 mg/dL (ref 7.0–26.0)
CO2: 27 mEq/L (ref 22–29)
CREATININE: 0.6 mg/dL (ref 0.6–1.1)
Calcium: 9.4 mg/dL (ref 8.4–10.4)
Chloride: 103 mEq/L (ref 98–109)
Glucose: 93 mg/dl (ref 70–140)
Potassium: 3.7 mEq/L (ref 3.5–5.1)
Sodium: 140 mEq/L (ref 136–145)
Total Bilirubin: 0.22 mg/dL (ref 0.20–1.20)
Total Protein: 6.5 g/dL (ref 6.4–8.3)

## 2014-06-30 MED ORDER — GABAPENTIN 100 MG PO CAPS: 100.0000 mg | ORAL_CAPSULE | Freq: Three times a day (TID) | ORAL | Status: DC

## 2014-06-30 MED ORDER — METHYLPHENIDATE HCL 5 MG PO TABS: 5.0000 mg | ORAL_TABLET | Freq: Two times a day (BID) | ORAL | Status: DC

## 2014-06-30 MED ORDER — DIPHENOXYLATE-ATROPINE 2.5-0.025 MG PO TABS: 1.0000 | ORAL_TABLET | Freq: Four times a day (QID) | ORAL | Status: DC | PRN

## 2014-06-30 MED ORDER — SIMETHICONE 80 MG PO CHEW: 80.0000 mg | CHEWABLE_TABLET | Freq: Four times a day (QID) | ORAL | Status: DC | PRN

## 2014-06-30 NOTE — Progress Notes (Signed)
. Patient ID: Carrie Berry, female   DOB: Nov 11, 1950, 64 y.o.   MRN: [id number] ID: Carrie Berry OB: 19-Apr-1951  MR#: [id number]  CSN#:[id number]  PCP: [name], MD GYN:   SU: Carrie Berry OTHER MD: Carrie Berry, Carrie Berry, Carrie Berry, Carrie Berry  CHIEF COMPLAINT:  Metastatic Breast Cancer CURRENT TREATMENT: Doxil  BREAST CANCER HISTORY: From the originally intake note:  Carrie Berry noted a mass in her right breast December of 2013, but did not think much of it. It did grow some lower the summer, but she was keeping her grandchild at that time and was too busy so she did not bring it to Dr. Bernell List attention until August. He set her up for bilateral mammography and ultrasonography at [facility] 12/18/2012 and this measured, on the right, a large irregular lobulated mass in the upper outer quadrant which by ultrasound measured 5.7 cm. The right axilla showed some lymph nodes with thickened cortices. In the left breast there was a focal asymmetry at the depth, lateral to the nipple, and by ultrasound there was a 9 mm ill-defined hypoechoic lesion in this location. Biopsy of the left breast lesion showed only fibrocystic changes.  Biopsy of the right breast mass and right axillary adenopathy ([id number]) on 12/23/2012 showed both to be involved by invasive ductal carcinoma, grade 2, triple negative, with an MIB-1 of 86%.  MRI of the breast 12/27/2012 at [facility] showed in the right breast a mass abutting the pectoralis muscle without enhancement of the muscle measuring 4.8 cm. The satellite nodule measuring approximately 3 mm was also noted superior to the mass and several abnormal and enlarged right axillary lymph nodes were noted, one of which appeared to be necrotic. The largest node measured 2.0 cm. Unfortunately, numerous bilateral pulmonary nodules were also noted.  The patient's subsequent history is as detailed below   INTERVAL HISTORY: Carrie Berry returns today  for follow-up of her metastatic breast cancer, accompanied by her husband Carrie Berry. Today is day 6 cycle 2 of Doxil, given every 28 days. She did well with this cycle. Her appetite has improved slightly, though taste perversion of some foods is still an issue. Her mouth sores have completely resolved with diflucan and acyclovir daily.  REVIEW OF SYSTEMS Carrie Berry denies fevers or chills. Her nausea is managed with the use of compazine. She has rumbling gas pains that she cant get rid of.  She has been having loose bowel movements this week, and had about 6 yesterday. The patient is requesting a prescription for lomotil instead of imodium. Her back pain is controlled with the occasional dose of dilaudid, but this is infrequent. She denies shortness of breath, chest pain, cough, or palpitations. Her energy level is fair. She sleeps off and on a night. A detailed review of systems is otherwise stable.  PAST MEDICAL HISTORY: Past Medical History  Diagnosis Date  . Recurrent sinus infections   . Arthritis   . PONV (postoperative nausea and vomiting)   . GERD (gastroesophageal reflux disease)   . 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 Anxiety     no problems at present  . rt breast ca dx'd 12/2011    breast  . Breast cancer   . 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 Tubal ligation    . Tubal ligation  1985  . Portacath placement N/A 01/10/2013  Procedure: ULTRASOUND GUIDED PORT-A-CATH INSERTION WITH FLUOROSCOPY;  Surgeon: [name], MD;  Location: [place];  Service: General;  Laterality: N/A;  . Esophagogastroduodenoscopy N/A 08/19/2013    Procedure: ESOPHAGOGASTRODUODENOSCOPY (EGD);  Surgeon: Garlan Fair, MD;  Location: [facility];  Service: Endoscopy;  Laterality: N/A;  . Total mastectomy Right 02/17/2014    Procedure: PALLIATIVE  RIGHT MASTECTOMY;  Surgeon: Carrie Confer, MD;  Location: [place];  Service: General;  Laterality: Right;    FAMILY HISTORY Family History  Problem Relation Age of Onset  . Bladder Cancer Father   . Colon cancer Maternal Grandmother    the patient's parents are living, both in their 44s. The patient's father was diagnosed with bladder cancer the age of 40. The patient's mother's mother had some type of gastrointestinal cancer. The patient had one brother, no sisters. There is no history of breast or ovarian cancer in the family other than a cousin on the father's side who was diagnosed with breast cancer apparently before the age of 47.  GYNECOLOGIC HISTORY:   (Reviewed 10/13/2013) Menarche age 14, first live birth age 58, the patient is Carrie Berry P2. She went through menopause in 2008. She did not take hormone replacement. She took birth control for approximately 22 years remotely.  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 She's currently on disability. Her husband Carrie Berry Carrie Berry"), is vice Development worker, international aid for [employer]. The patient's daughter Carrie Berry is a stay-at-home mom in [place], the patient's son Carrie Berry unfortunately died in an automobile accident in December of 2011. The patient has 2 grandchildren. She attends to a local [employer].    ADVANCED DIRECTIVES: Not in place   HEALTH MAINTENANCE: (Updated 10/13/2013) History  Substance Use Topics  . Smoking status: Never Smoker   . Smokeless tobacco: Never Used  . Alcohol Use: No     Colonoscopy: 2005  PAP: Not on file  Bone density: Not on file  Lipid panel:  Not on file/Dr. Polite   Allergies  Allergen Reactions  . Ondansetron Hcl Other (See Comments)    HEADACHE, patient states she still takes   . Codeine Nausea And Vomiting    "violently ill"  . Penicillins Rash    Childhood reaction -    Current Outpatient  Prescriptions  Medication Sig Dispense Refill  . acyclovir (ZOVIRAX) 400 MG tablet Take 1 tablet (400 mg total) by mouth 5 (five) times daily. 60 tablet 6  . Cholecalciferol (VITAMIN D PO) Take 1 tablet by mouth daily.    Marland Kitchen docusate sodium 100 MG CAPS Take 100 mg by mouth daily. (Patient taking differently: Take 200 mg by mouth daily. ) 10 capsule 0  . fluconazole (DIFLUCAN) 100 MG tablet Take 1 tablet (100 mg total) by mouth daily. 30 tablet 4  . HYDROmorphone (DILAUDID) 2 MG tablet Take 0.5 tablets (1 mg total) by mouth every 4 (four) hours as needed. 10 tablet 0  . lidocaine-prilocaine (EMLA) cream Apply to affected area once 30 g 3  . methylphenidate (RITALIN) 5 MG tablet Take 1 tablet (5 mg total) by mouth 2 (two) times daily with breakfast and lunch. 60 tablet 0  . mirtazapine (REMERON) 30 MG tablet Take 1 tablet (30 mg total) by mouth at bedtime. 30 tablet 1  . Multiple Vitamin (MULTIVITAMIN WITH MINERALS) TABS tablet Take 1 tablet by mouth daily.    Marland Kitchen omeprazole (PRILOSEC) 20 MG  capsule TAKE 1 CAPSULE (20 MG TOTAL) BY MOUTH 2 (TWO) TIMES DAILY BEFORE A MEAL. 60 capsule 1  . ondansetron (ZOFRAN) 8 MG tablet Take 1 tablet (8 mg total) by mouth 2 (two) times daily. Start the day after chemo for 2 days. Then take as needed for nausea or vomiting. 30 tablet 1  . prochlorperazine (COMPAZINE) 10 MG tablet TAKE 1 TABLET (10 MG TOTAL) BY MOUTH EVERY 6 (SIX) HOURS AS NEEDED (NAUSEA OR VOMITING). 30 tablet 1  . traZODone (DESYREL) 50 MG tablet Take 2 tablets (100 mg total) by mouth at bedtime. 60 tablet 0  . gabapentin (NEURONTIN) 100 MG capsule Take 1 capsule (100 mg total) by mouth 3 (three) times daily. 90 capsule 2  . promethazine (PHENERGAN) 25 MG tablet Take 1 tablet (25 mg total) by mouth every 6 (six) hours as needed for nausea or vomiting. (Patient not taking: Reported on 06/30/2014) 15 tablet 0   No current facility-administered medications for this visit.    OBJECTIVE: Middle-aged white  woman examined in a wheelchair Filed Vitals:   06/30/14 1122  BP: 104/61  Pulse: 99  Temp: 98.7 F (37.1 C)  Resp: 18     Body mass index is 22.27 kg/(m^2).    ECOG FS: 2 Filed Weights   06/30/14 1122  Weight: 125 lb 11.2 oz (57.017 kg)    Sclerae unicteric, pupils equal and reactive, EOMs intact Oropharynx shows thrush, no other lesions No cervical or supraclavicular adenopathy Lungs no rales or rhonchi Heart regular rate and rhythm Abd soft, nontender, positive bowel sounds MSK no focal spinal tenderness, no upper extremity lymphedema Neuro: nonfocal, well oriented, appropriate affect Breasts: right chest wall recurrence as pictured. Now breaking through the skin.  Image taken 06/30/14       LAB RESULTS:   Lab Results  Component Value Date   WBC 11.7* 06/30/2014   NEUTROABS 9.4* 06/30/2014   HGB 12.0 06/30/2014   HCT 37.0 06/30/2014   MCV 100.8 06/30/2014   PLT 212 06/30/2014      Chemistry      Component Value Date/Time   NA 140 06/30/2014 1110   NA 134* 05/20/2014 1114   K 3.7 06/30/2014 1110   K 3.7 05/20/2014 1114   CL 99 05/20/2014 1114   CO2 27 06/30/2014 1110   CO2 24 05/20/2014 1114   BUN 17.2 06/30/2014 1110   BUN 15 05/20/2014 1114   CREATININE 0.6 06/30/2014 1110   CREATININE 0.35* 05/20/2014 1114      Component Value Date/Time   CALCIUM 9.4 06/30/2014 1110   CALCIUM 8.4 05/20/2014 1114   ALKPHOS 92 06/30/2014 1110   ALKPHOS 69 05/20/2014 1114   AST 63* 06/30/2014 1110   AST 30 05/20/2014 1114   ALT 76* 06/30/2014 1110   ALT 28 05/20/2014 1114   BILITOT 0.22 06/30/2014 1110   BILITOT 0.5 05/20/2014 1114        STUDIES: Mr Liver W Wo Contrast  2014/06/08   CLINICAL DATA:  Followup metastatic breast carcinoma.  EXAM: MRI ABDOMEN WITHOUT AND WITH CONTRAST  TECHNIQUE: Multiplanar multisequence MR imaging of the abdomen was performed both before and after the administration of intravenous contrast.  CONTRAST:  41mL MULTIHANCE  GADOBENATE DIMEGLUMINE 529 MG/ML IV SOLN  COMPARISON:  CT on 02/28/2014 and MRI on 12/04/2013  FINDINGS: Lower chest:  Unremarkable.  Hepatobiliary: Multiple new liver metastases are seen in the medial and lateral segments of the left hepatic lobe, with largest measuring 2.3 cm on  image 29/series 1102. A new metastasis is seen in the anterior segment right lobe measuring 1.4 cm on image 34/series 1102. A few other tiny sub-cm lesions in the right hepatic lobe remain stable.  Pancreas: No cystic or solid masses identified. No peripancreatic inflammatory changes or fluid collections demonstrated.  Spleen:  Within normal limits in size and appearance.  Adrenal Glands:  No masses identified.  Kidneys: No complex cystic or solid masses identified. No evidence of urolithiasis or hydronephrosis.  Stomach/Bowel/Peritoneum: No evidence of wall thickening, mass, or obstruction involving visualized abdominal bowel.  Vascular/Lymphatic: No pathologically enlarged lymph nodes identified. No other significant abnormality noted.  Other:  None.  Musculoskeletal:  No suspicious bone lesions identified.  IMPRESSION: Further interval progression of liver metastases compared with prior exams.  No other sites of metastatic disease identified within the abdomen.   Electronically Signed   By: Carrie Berry M.D.   On: 06/03/2014 17:01      ASSESSMENT: 63 y.o. [name], [place] woman status post right breast upper outer quadrant and right axillary lymph node biopsy 12/23/2012 for a clinical T3 N1 M1, stage IV invasive ductal carcinoma, grade 3, triple negative, with an MIB-1 of 86%.  (1) right liver lobe biopsy 01/21/2013 confirms metastatic adenocarcinoma; scans show involvement of the liver, lungs, and likely bone.  (2) enrolled in [facility] study [id number] (docetaxel + oral gamma secretase inibitor [id number]), received one cycle starting 02/04/2013  but withdrew because of poor tolerance despite treatment interruptions and decreased dosing of  the oral component  (3)  chest CT in early November 2014 was compared with studies in August 2014 and showed interval progression of metastatic breast cancer, with an enlarging primary right breast mass, enlarging right axillary lymph nodes, enlarging pulmonary nodules and new/enlarging hepatic metastases.  (4) Abraxane started 03/19/2013, given day 1 and day 8 of each 21 day cycle, stopped with the day 1 cycle 3 dose (04/29/2013) because of local progression of disease  (5) cyclophosphamide and doxorubicin started 05/20/2013, given in dose dense fashion  with Neulasta support on day 2. Completed 4 cycles 07/03/2013, with the final cycle dose reduced 15% because of an episode of febrile neutropenia after cycle 3. Restaging studies 07/15/2013 showed partial response.  (6) started capecitabine April 2015, 1.5 g by mouth twice a day, 7 days on 7 days off--discontinued October 2015 with progression  (7) s/p Right simple mastectomy 02/17/2014 to optimize local control, the pathology (SZA [id number]) showing an invasive ductal carcinoma measuring 4.8 cm, grade 3, with close but negative margins and evidence of treatment response   (a) nodule on right mastectomy scar noted 04/03/2014, removed 04/14/2014  (b) new nodule noted on R chest scar 05/19/2014  (c) considering radiation to R chest wall  (8) multiple brain metastases noted on brain MRI 02/26/2014--s/p whole brain irradiation completed 03/17/2014  (a) repeat brain MRI 05/12/2014 shows favorable response  (9) protein-calorie malnutrition  (10) high fall risk  (11) advanced directives: not in place; husband is healthcare power of attorney  (12) unexplained paroxysmal knee pain: possible ruptured Baker's cyst, Left, 05/01/2014  (13) doxil to start after chest-wall irradiation (target start date 06/23/2014)   PLAN: Dillie is doing moderately well today. The labs were reviewed in detail and she is demonstrating an elevation in her AST and ALT.  Otherwise the rest of the labs were stable.   I brought Dr. [name] in to evaluate her right chest wall lesion and he suggests referring her to Dr. Pablo Ledger to see if  this could be controlled with radiation.   I sent in a prescription for gabapentin 100mg  TID for her back sensitivity. She feels like the nerves are irritated in this specific area. She will try this dose and if it is helpful with minimal drowsiness we may increase the dose. I also wrote for simethicone for her gas pains.  Maryetta will return in 3 weeks, prior to the start of cycle 3 of doxil. Prior to this visit she will have a brain MRI. She understands and agrees with this plan. She knows the goal of treatment in her case is control. She has been encouraged to call with any issues that might arise before her next visit here.  Marcelino Duster, NP   06/30/2014 12:20 PM

## 2014-06-30 NOTE — Telephone Encounter (Signed)
, °

## 2014-07-02 ENCOUNTER — Other Ambulatory Visit: Payer: Self-pay | Admitting: Oncology

## 2014-07-02 DIAGNOSIS — C50911 Malignant neoplasm of unspecified site of right female breast: Secondary | ICD-10-CM

## 2014-07-03 NOTE — Telephone Encounter (Signed)
none

## 2014-07-09 ENCOUNTER — Ambulatory Visit
Admission: RE | Admit: 2014-07-09 | Discharge: 2014-07-09 | Disposition: A | Discharge status: Home / Self Care | Payer: BC Managed Care – PPO | Source: Ambulatory Visit | Attending: Radiation Oncology | Admitting: Radiation Oncology

## 2014-07-09 ENCOUNTER — Encounter: Payer: Self-pay | Admitting: Radiation Oncology

## 2014-07-09 VITALS — BP 113/64 | HR 100 | Temp 98.5°F | Resp 12 | Wt 130.5 lb

## 2014-07-09 DIAGNOSIS — C50911 Malignant neoplasm of unspecified site of right female breast: Secondary | ICD-10-CM

## 2014-07-09 DIAGNOSIS — Z510 Encounter for antineoplastic radiation therapy: Secondary | ICD-10-CM | POA: Diagnosis not present

## 2014-07-09 NOTE — Progress Notes (Signed)
.     Department of Radiation Oncology  Phone:  [phone] Fax:        [phone]   Name: Carrie Berry MRN: [id number]  DOB: [birth year]  Date: 07/09/2014  Follow Up Visit Note  Diagnosis: Breast cancer of right female breast s/p palliative mastectomy 02/17/14   Staging form: Breast, AJCC 7th Edition     Clinical: Stage IV (T2, N1, M1) - Unsigned       Staging comments: Staged at breast conference 01/01/13  Summary and Interval since last radiation: Whole brain radiation completed 03/17/14  Interval History: Carrie Berry presents today for follow up. She is tolerating chemotherapy well. She has a spot on her right chest wall that has broken through the skin. Her right axillary mass is growing and bothers her occasionally but she does not take pain medications for this. She is gaining weight. Her husband accompanies her.   Physical Exam:  Filed Vitals:   07/09/14 1115  BP: 113/64  Pulse: 100  Temp: 98.5 F (36.9 C)  TempSrc: Oral  Resp: 12  Weight: 130 lb 8 oz (59.194 kg)  SpO2: 100%  There is a nodule in the lateral aspect of the right chest wall scar measuring about 2 cm. It is bleeding and oozing blood.  There is a hard, fixed axillary mass which is larger than prior superior to this as well.   IMPRESSION: Carrie Berry is a 64 y.o. female with rapid,  progressive chest wall recurrence and clinical evidence or right axillary recurrence  PLAN:  I spoke with Zigmund Daniel and her husband at length today.  We discussed the role of radiation in palliating local symptoms. I think we can successfully treat this area with short course of radiation. We will bring her in for simulation next week and then deliver 5 fractions so as not to delay her chemotherapy. Her husband discussed including her axillary lymph nodes but she felt like this was not bothering her now and she did nto want to delay chemotherapy. She signed informed consent.   Thea Silversmith, MD

## 2014-07-09 NOTE — Progress Notes (Signed)
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   Receptor Status: ER(-), PR (-), Her2-neu (-)  Did patient present with symptoms: August 2014-dx with breast cancer  Past/Anticipated interventions by surgeon, if any:  Past/Anticipated interventions by medical oncology, if any: Chemotherapy:ASSESSMENT: 64 y.o. Carrie Berry, [place] woman status post right breast upper outer quadrant and right axillary lymph node biopsy 12/23/2012 for a clinical T3 N1 M1, stage IV invasive ductal carcinoma, grade 3, triple negative, with an MIB-1 of 86%.  (1) right liver lobe biopsy 01/21/2013 confirms metastatic adenocarcinoma; scans show involvement of the liver, lungs, and likely bone.  (2) enrolled in [facility] study [id number] (docetaxel + oral gamma secretase inibitor [id number]), received one cycle starting 02/04/2013 but withdrew because of poor tolerance despite treatment interruptions and decreased dosing of the oral component  (3) chest CT in early November 2014 was compared with studies in August 2014 and showed interval progression of metastatic breast cancer, with an enlarging primary right breast mass, enlarging right axillary lymph nodes, enlarging pulmonary nodules and new/enlarging hepatic metastases.  (4) Abraxane started 03/19/2013, given day 1 and day 8 of each 21 day cycle, stopped with the day 1 cycle 3 dose (04/29/2013) because of local progression of disease  (5) cyclophosphamide and doxorubicin started 05/20/2013, given in dose dense fashion with Neulasta support on day 2. Completed 4 cycles 07/03/2013, with the final cycle dose reduced 15% because of an episode of febrile neutropenia after cycle 3. Restaging studies 07/15/2013 showed partial response.  (6) started capecitabine April 2015, 1.5 g by mouth twice a day, 7 days on 7 days off--discontinued October 2015 with progression  (7) s/p Right simple mastectomy 02/17/2014 to optimize local control, the  pathology (SZA [id number]) showing an invasive ductal carcinoma measuring 4.8 cm, grade 3, with close but negative margins and evidence of treatment response  (a) nodule on right mastectomy scar noted 04/03/2014, removed 04/14/2014  (8) multiple brain metastases noted on brain MRI 02/26/2014--s/p whole brain irradiation completed 03/17/2014  (9) protein-calorie malnutrition  (10) high fall risk  (11) advanced directives: Not in place; husband is healthcare power of attorney  Lymphedema issues, if any: reports left axilla area   Pain issues, if any: Pt reports she has pain "feels like someone is hitting her with a hammer" over her knees,hips,ankles, joint pain is intermittent.  Pt takes dilaudid which helps.  Experiences nausea during pain episodes. Takes Compazine for nausea. Reports lower right ankle, calf swelling for the last few weeks.    SAFETY ISSUES:  Prior radiation?whole brain irradiation 02/26/14   Pacemaker/ICD? No  Possible current pregnancy?No  Is the patient on methotrexate?No   Current Complaints / other details: Menarche age 32, first live birth age 73.GXP2.menopause in 2008.No hormonal replacement therapy.birth control approximately 22 years. Patient to resume capecitabine or other palliative treatment to control peripheral disease if brain stable.

## 2014-07-14 ENCOUNTER — Ambulatory Visit
Admission: RE | Admit: 2014-07-14 | Discharge: 2014-07-14 | Disposition: A | Discharge status: Home / Self Care | Payer: BC Managed Care – PPO | Source: Ambulatory Visit | Attending: Radiation Oncology | Admitting: Radiation Oncology

## 2014-07-14 ENCOUNTER — Ambulatory Visit: Payer: BC Managed Care – PPO | Admitting: Radiation Oncology

## 2014-07-14 DIAGNOSIS — Z510 Encounter for antineoplastic radiation therapy: Secondary | ICD-10-CM | POA: Diagnosis not present

## 2014-07-14 DIAGNOSIS — C50411 Malignant neoplasm of upper-outer quadrant of right female breast: Secondary | ICD-10-CM

## 2014-07-14 NOTE — Progress Notes (Signed)
Name: Carrie Berry   MRN: [id number]  Date:  07/14/2014   DOB: 02-11-1951  Status:outpatient    DIAGNOSIS:    ICD-9-CM ICD-10-CM   1. Breast cancer of right female breast s/p palliative mastectomy 02/17/14 174.4 C50.411     CONSENT VERIFIED: yes   SET UP: Patient is setup supine   IMMOBILIZATION:  The patient was immobilized putting her hand in her waistband   NARRATIVE: Carrie Berry underwent complex simulation and treatment planning for her treatment today. A generous margin was placed on the nodule on her chest wall and appropriate block chosen to correspond to this treatment.   9  MeV electrons will be prescribed with daily bolus.   I personally oversaw and approved the construction of a unique block which will be used for beam modification purposes.  A special port plan is requested.

## 2014-07-16 ENCOUNTER — Ambulatory Visit
Admission: RE | Admit: 2014-07-16 | Discharge: 2014-07-16 | Disposition: A | Discharge status: Home / Self Care | Payer: BC Managed Care – PPO | Source: Ambulatory Visit | Attending: Radiation Oncology | Admitting: Radiation Oncology

## 2014-07-16 ENCOUNTER — Other Ambulatory Visit: Payer: Self-pay | Admitting: Radiation Oncology

## 2014-07-16 DIAGNOSIS — Z510 Encounter for antineoplastic radiation therapy: Secondary | ICD-10-CM | POA: Diagnosis not present

## 2014-07-17 ENCOUNTER — Encounter: Payer: Self-pay | Admitting: *Deleted

## 2014-07-17 ENCOUNTER — Ambulatory Visit
Admission: RE | Admit: 2014-07-17 | Discharge: 2014-07-17 | Disposition: A | Discharge status: Home / Self Care | Payer: BC Managed Care – PPO | Source: Ambulatory Visit | Attending: Radiation Oncology | Admitting: Radiation Oncology

## 2014-07-17 DIAGNOSIS — Z510 Encounter for antineoplastic radiation therapy: Secondary | ICD-10-CM | POA: Diagnosis not present

## 2014-07-17 NOTE — Progress Notes (Signed)
[place] Psychosocial Distress Screening Clinical Social Work  Clinical Social Work was referred by distress screening protocol.  The patient scored a 5 on the Psychosocial Distress Thermometer which indicates moderate distress. Clinical Social Worker phoned pt to assess for distress and other psychosocial needs. CSW had met with pt at previous appointment as well. She reports things have improved greatly in the last few weeks. She feels less distress and feels her needs are being met currently. Pt open to CSW checking in at future appointments and CSW plans to do so next week.   ONCBCN DISTRESS SCREENING 07/09/2014  Screening Type Other (comment)  Distress experienced in past week (1-10) 5  Practical problem type Work/school;Food  Emotional problem type Nervousness/Anxiety;Boredom;Adjusting to appearance changes  Physical Problem type Getting around  Other      Clinical Social Worker follow up needed: Yes.    If yes, follow up plan: See above Loren Racer, May Worker [place]  [facility] Phone: [phone] Fax: [phone]

## 2014-07-20 ENCOUNTER — Ambulatory Visit (HOSPITAL_COMMUNITY)
Admission: RE | Admit: 2014-07-20 | Discharge: 2014-07-20 | Disposition: A | Discharge status: Home / Self Care | Payer: BC Managed Care – PPO | Source: Ambulatory Visit | Attending: Oncology | Admitting: Oncology

## 2014-07-20 ENCOUNTER — Other Ambulatory Visit: Payer: Self-pay | Admitting: Oncology

## 2014-07-20 ENCOUNTER — Ambulatory Visit
Admission: RE | Admit: 2014-07-20 | Discharge: 2014-07-20 | Disposition: A | Discharge status: Home / Self Care | Payer: BC Managed Care – PPO | Source: Ambulatory Visit | Attending: Radiation Oncology | Admitting: Radiation Oncology

## 2014-07-20 DIAGNOSIS — Z923 Personal history of irradiation: Secondary | ICD-10-CM | POA: Diagnosis not present

## 2014-07-20 DIAGNOSIS — C50919 Malignant neoplasm of unspecified site of unspecified female breast: Secondary | ICD-10-CM

## 2014-07-20 DIAGNOSIS — Z08 Encounter for follow-up examination after completed treatment for malignant neoplasm: Secondary | ICD-10-CM | POA: Diagnosis not present

## 2014-07-20 DIAGNOSIS — C7931 Secondary malignant neoplasm of brain: Secondary | ICD-10-CM | POA: Insufficient documentation

## 2014-07-20 DIAGNOSIS — Z510 Encounter for antineoplastic radiation therapy: Secondary | ICD-10-CM | POA: Diagnosis not present

## 2014-07-20 MED ORDER — GADOBENATE DIMEGLUMINE 529 MG/ML IV SOLN
12.0000 mL | Freq: Once | INTRAVENOUS | Status: AC | PRN
  Administered 2014-07-20: 12 mL via INTRAVENOUS

## 2014-07-21 ENCOUNTER — Encounter: Payer: Self-pay | Admitting: Radiation Oncology

## 2014-07-21 ENCOUNTER — Ambulatory Visit
Admission: RE | Admit: 2014-07-21 | Discharge: 2014-07-21 | Disposition: A | Discharge status: Home / Self Care | Payer: BC Managed Care – PPO | Source: Ambulatory Visit | Attending: Radiation Oncology | Admitting: Radiation Oncology

## 2014-07-21 ENCOUNTER — Other Ambulatory Visit: Payer: Self-pay | Admitting: *Deleted

## 2014-07-21 VITALS — BP 109/63 | HR 97 | Temp 98.1°F | Resp 20

## 2014-07-21 DIAGNOSIS — C50411 Malignant neoplasm of upper-outer quadrant of right female breast: Secondary | ICD-10-CM | POA: Insufficient documentation

## 2014-07-21 DIAGNOSIS — C799 Secondary malignant neoplasm of unspecified site: Secondary | ICD-10-CM | POA: Diagnosis not present

## 2014-07-21 DIAGNOSIS — C50911 Malignant neoplasm of unspecified site of right female breast: Secondary | ICD-10-CM

## 2014-07-21 DIAGNOSIS — L598 Other specified disorders of the skin and subcutaneous tissue related to radiation: Secondary | ICD-10-CM | POA: Insufficient documentation

## 2014-07-21 DIAGNOSIS — Z510 Encounter for antineoplastic radiation therapy: Secondary | ICD-10-CM | POA: Diagnosis not present

## 2014-07-21 MED ORDER — RADIAPLEXRX EX GEL
Freq: Once | CUTANEOUS | Status: AC
  Administered 2014-07-21: 15:00:00 via TOPICAL

## 2014-07-21 MED ORDER — OMEPRAZOLE 20 MG PO CPDR: DELAYED_RELEASE_CAPSULE | ORAL | Status: DC

## 2014-07-21 NOTE — Progress Notes (Signed)
Patient denies pain, loss of appetite. She states she is regaining strength, uses a walker at home. She is not applying lotion to right chest wall, per Dr Pablo Ledger. Right chest wall has small red raised moist area which patient states was there when she began radiation. The remainder of skin in her treatment area is hyperpigmented, dry. She bruises easily, skin overall is dry.

## 2014-07-21 NOTE — Progress Notes (Signed)
  Radiation Oncology         [phone] ________________________________  Name: Carrie Berry MRN: [id number]  Date: 07/21/2014  DOB: 1951-03-26  Weekly Radiation Therapy Management  Diagnosis: metastatic breast cancer  Current Dose: 16 Gy     Planned Dose:  20 Gy  Narrative . . . . . . . . The patient presents for routine under treatment assessment.                                   The patient is without complaint.                                 Set-up films were reviewed.                                 The chart was checked. Physical Findings. . .  oral temperature is 98.1 F (36.7 C). Her blood pressure is 109/63 and her pulse is 97. Her respiration is 20. Marland Kitchen Slight erythema noted in the electron portal. No bleeding or skin breakdown. The lungs are clear. The heart has a regular rhythm and rate. Impression . . . . . . . The patient is tolerating radiation. Plan . . . . . . . . . . . . Continue treatment as planned.  ________________________________   Blair Promise, PhD, MD

## 2014-07-22 ENCOUNTER — Other Ambulatory Visit (HOSPITAL_BASED_OUTPATIENT_CLINIC_OR_DEPARTMENT_OTHER): Payer: BC Managed Care – PPO

## 2014-07-22 ENCOUNTER — Ambulatory Visit
Admission: RE | Admit: 2014-07-22 | Discharge: 2014-07-22 | Disposition: A | Discharge status: Home / Self Care | Payer: BC Managed Care – PPO | Source: Ambulatory Visit | Attending: Radiation Oncology | Admitting: Radiation Oncology

## 2014-07-22 ENCOUNTER — Ambulatory Visit (HOSPITAL_BASED_OUTPATIENT_CLINIC_OR_DEPARTMENT_OTHER): Payer: BC Managed Care – PPO | Admitting: Oncology

## 2014-07-22 ENCOUNTER — Encounter: Payer: Self-pay | Admitting: Radiation Oncology

## 2014-07-22 ENCOUNTER — Other Ambulatory Visit: Payer: Self-pay | Admitting: Oncology

## 2014-07-22 ENCOUNTER — Telehealth: Payer: Self-pay | Admitting: Oncology

## 2014-07-22 VITALS — BP 110/70 | HR 90 | Temp 98.6°F | Resp 18 | Ht 63.0 in | Wt 131.1 lb

## 2014-07-22 DIAGNOSIS — C787 Secondary malignant neoplasm of liver and intrahepatic bile duct: Secondary | ICD-10-CM

## 2014-07-22 DIAGNOSIS — Z510 Encounter for antineoplastic radiation therapy: Secondary | ICD-10-CM | POA: Diagnosis not present

## 2014-07-22 DIAGNOSIS — C50411 Malignant neoplasm of upper-outer quadrant of right female breast: Secondary | ICD-10-CM

## 2014-07-22 DIAGNOSIS — C773 Secondary and unspecified malignant neoplasm of axilla and upper limb lymph nodes: Secondary | ICD-10-CM

## 2014-07-22 DIAGNOSIS — E46 Unspecified protein-calorie malnutrition: Secondary | ICD-10-CM

## 2014-07-22 DIAGNOSIS — C50911 Malignant neoplasm of unspecified site of right female breast: Secondary | ICD-10-CM

## 2014-07-22 DIAGNOSIS — C7800 Secondary malignant neoplasm of unspecified lung: Secondary | ICD-10-CM

## 2014-07-22 DIAGNOSIS — C7931 Secondary malignant neoplasm of brain: Secondary | ICD-10-CM

## 2014-07-22 DIAGNOSIS — C50919 Malignant neoplasm of unspecified site of unspecified female breast: Secondary | ICD-10-CM

## 2014-07-22 DIAGNOSIS — I5020 Unspecified systolic (congestive) heart failure: Secondary | ICD-10-CM

## 2014-07-22 DIAGNOSIS — I739 Peripheral vascular disease, unspecified: Secondary | ICD-10-CM

## 2014-07-22 LAB — CBC WITH DIFFERENTIAL/PLATELET
BASO%: 0.4 % (ref 0.0–2.0)
BASOS ABS: 0 10*3/uL (ref 0.0–0.1)
EOS ABS: 0 10*3/uL (ref 0.0–0.5)
EOS%: 0.6 % (ref 0.0–7.0)
HCT: 39.1 % (ref 34.8–46.6)
HEMOGLOBIN: 12.8 g/dL (ref 11.6–15.9)
LYMPH%: 9.3 % — AB (ref 14.0–49.7)
MCH: 32.9 pg (ref 25.1–34.0)
MCHC: 32.7 g/dL (ref 31.5–36.0)
MCV: 100.5 fL (ref 79.5–101.0)
MONO#: 0.8 10*3/uL (ref 0.1–0.9)
MONO%: 10.6 % (ref 0.0–14.0)
NEUT%: 79.1 % — AB (ref 38.4–76.8)
NEUTROS ABS: 5.7 10*3/uL (ref 1.5–6.5)
PLATELETS: 199 10*3/uL (ref 145–400)
RBC: 3.89 10*6/uL (ref 3.70–5.45)
RDW: 17.9 % — AB (ref 11.2–14.5)
WBC: 7.2 10*3/uL (ref 3.9–10.3)
lymph#: 0.7 10*3/uL — ABNORMAL LOW (ref 0.9–3.3)

## 2014-07-22 LAB — COMPREHENSIVE METABOLIC PANEL (CC13)
ALBUMIN: 3.4 g/dL — AB (ref 3.5–5.0)
ALK PHOS: 78 U/L (ref 40–150)
ALT: 31 U/L (ref 0–55)
ANION GAP: 11 meq/L (ref 3–11)
AST: 27 U/L (ref 5–34)
BILIRUBIN TOTAL: 0.28 mg/dL (ref 0.20–1.20)
BUN: 16 mg/dL (ref 7.0–26.0)
CO2: 26 meq/L (ref 22–29)
CREATININE: 0.7 mg/dL (ref 0.6–1.1)
Calcium: 9 mg/dL (ref 8.4–10.4)
Chloride: 104 mEq/L (ref 98–109)
EGFR: >90 mL/min/{1.73_m2} (ref >90)
Glucose: 98 mg/dl (ref 70–140)
Potassium: 4.6 mEq/L (ref 3.5–5.1)
Sodium: 141 mEq/L (ref 136–145)
Total Protein: 6.4 g/dL (ref 6.4–8.3)

## 2014-07-22 LAB — TECHNOLOGIST REVIEW

## 2014-07-22 MED ORDER — DIPHENOXYLATE-ATROPINE 2.5-0.025 MG PO TABS: 1.0000 | ORAL_TABLET | Freq: Four times a day (QID) | ORAL | Status: DC | PRN

## 2014-07-22 MED ORDER — ACYCLOVIR 400 MG PO TABS: 400.0000 mg | ORAL_TABLET | Freq: Two times a day (BID) | ORAL | Status: AC

## 2014-07-22 NOTE — Progress Notes (Signed)
  Radiation Oncology         [phone] ________________________________  Name: Carrie Berry MRN: [id number]  Date: 07/22/2014  DOB: 16-Apr-1951  End of Treatment Note  Diagnosis:   Metastatic breast cancer with skin recurrence after mastectomy  Indication for treatment:  Palliative      Radiation treatment dates:   07/16/2014-07/22/2014  Site/dose:  Right chest wall nodule/ 20 Gy in 5 fractions  Beams/energy:  En face 9 MeV electrons  Narrative: The patient tolerated radiation treatment relatively well.   She continued on to chemotherapy.   Plan: The patient has completed radiation treatment. The patient will return to radiation oncology clinic for routine followup in one month. I advised them to call or return sooner if they have any questions or concerns related to their recovery or treatment.  ------------------------------------------------  Thea Silversmith, MD

## 2014-07-22 NOTE — Telephone Encounter (Signed)
pof for scan noted and cen sch will call for appt

## 2014-07-22 NOTE — Progress Notes (Signed)
. Patient ID: Carrie Berry, female   DOB: 16-Jun-1950, 64 y.o.   MRN: [id number] ID: [name] OB: May 10, 1951  MR#: [id number]  CSN#:[id number]  PCP: [name], MD GYN:   SU: [name] OTHER MD: Thea Silversmith, Earle Gell, Christene Slates, Heath Gold  CHIEF COMPLAINT:  Metastatic Breast Cancer CURRENT TREATMENT: Doxil  BREAST CANCER HISTORY: From the originally intake note:  Carrie Berry noted a mass in her right breast December of 2013, but did not think much of it. It did grow some lower the summer, but she was keeping her grandchild at that time and was too busy so she did not bring it to Dr. Bernell List attention until August. He set her up for bilateral mammography and ultrasonography at [facility] 12/18/2012 and this measured, on the right, a large irregular lobulated mass in the upper outer quadrant which by ultrasound measured 5.7 cm. The right axilla showed some lymph nodes with thickened cortices. In the left breast there was a focal asymmetry at the depth, lateral to the nipple, and by ultrasound there was a 9 mm ill-defined hypoechoic lesion in this location. Biopsy of the left breast lesion showed only fibrocystic changes.  Biopsy of the right breast mass and right axillary adenopathy ([id number]) on 12/23/2012 showed both to be involved by invasive ductal carcinoma, grade 2, triple negative, with an MIB-1 of 86%.  MRI of the breast 12/27/2012 at [facility] imaging showed in the right breast a mass abutting the pectoralis muscle without enhancement of the muscle measuring 4.8 cm. The satellite nodule measuring approximately 3 mm was also noted superior to the mass and several abnormal and enlarged right axillary lymph nodes were noted, one of which appeared to be necrotic. The largest node measured 2.0 cm. Unfortunately, numerous bilateral pulmonary nodules were also noted.  The patient's subsequent history is as detailed below   INTERVAL HISTORY: [name] returns today  for follow-up of her metastatic breast cancer, accompanied by her husband Carrie Berry. Today is day 27 cycle 2 of Doxil, given every 28 days. Since her last visit here she also completed (today) her radiation treatment to the right chest wall area for local tumor control. She has not developed any radiation recall but I have alerted her to that possibility.  REVIEW OF SYSTEMS [place] me she is walking much better. She is seldom using the walker at home. There have been no falls. She is very careful. Her sense of taste is better and she has regained her weight. She is having normal bowel movements. She has some sinus issues and occasionally headache associated with that. She denies nausea or vomiting. Pain is well-controlled on her current medications. A detailed review of systems today was otherwise stable  PAST MEDICAL HISTORY: Past Medical History  Diagnosis Date  . Recurrent sinus infections   . Arthritis   . PONV (postoperative nausea and vomiting)   . GERD (gastroesophageal reflux disease)   . 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 Anxiety     no problems at present  . rt breast ca dx'd 12/2011    breast  . Breast cancer   . 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 Tubal ligation    . Tubal ligation  1985  . Portacath placement N/A 01/10/2013    Procedure: ULTRASOUND GUIDED PORT-A-CATH INSERTION WITH FLUOROSCOPY;  Surgeon: [name], MD;  Location:  [facility];  Service: General;  Laterality: N/A;  . Esophagogastroduodenoscopy N/A 08/19/2013    Procedure: ESOPHAGOGASTRODUODENOSCOPY (EGD);  Surgeon: Garlan Fair, MD;  Location: [facility];  Service: Endoscopy;  Laterality: N/A;  . Total mastectomy Right 02/17/2014    Procedure: PALLIATIVE  RIGHT MASTECTOMY;  Surgeon: [name], MD;  Location: [place];  Service: General;  Laterality: Right;    FAMILY  HISTORY Family History  Problem Relation Age of Onset  . Bladder Cancer Father   . Colon cancer Maternal Grandmother    the patient's parents are living, both in their 11s. The patient's father was diagnosed with bladder cancer the age of 67. The patient's mother's mother had some type of gastrointestinal cancer. The patient had one brother, no sisters. There is no history of breast or ovarian cancer in the family other than a cousin on the father's side who was diagnosed with breast cancer apparently before the age of 70.  GYNECOLOGIC HISTORY:   (Reviewed 10/13/2013) Menarche age 27, first live birth age 75, the patient is [name] P2. She went through menopause in 2008. She did not take hormone replacement. She took birth control for approximately 22 years remotely.  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 She's currently on disability. Her husband Carrie Berry"), is vice Development worker, international aid for [employer]. The patient's daughter Carrie Berry is a stay-at-home mom in [place], the patient's son Carrie Berry unfortunately died in an automobile accident in December of 2011. The patient has 2 grandchildren. She attends to a local [employer].    ADVANCED DIRECTIVES: Not in place   HEALTH MAINTENANCE: (Updated 10/13/2013) History  Substance Use Topics  . Smoking status: Never Smoker   . Smokeless tobacco: Never Used  . Alcohol Use: No     Colonoscopy: 2005  PAP: Not on file  Bone density: Not on file  Lipid panel:  Not on file/Dr. Polite   Allergies  Allergen Reactions  . Ondansetron Hcl Other (See Comments)    HEADACHE, patient states she still takes   . Codeine Nausea And Vomiting    "violently ill"  . Penicillins Rash    Childhood reaction -    Current Outpatient Prescriptions  Medication Sig Dispense Refill  . acyclovir (ZOVIRAX) 400 MG tablet Take 1  tablet (400 mg total) by mouth 5 (five) times daily. 60 tablet 6  . Cholecalciferol (VITAMIN D PO) Take 1 tablet by mouth daily.    . diphenoxylate-atropine (LOMOTIL) 2.5-0.025 MG per tablet Take 1 tablet by mouth 4 (four) times daily as needed for diarrhea or loose stools. 30 tablet 0  . docusate sodium 100 MG CAPS Take 100 mg by mouth daily. (Patient taking differently: Take 200 mg by mouth daily. ) 10 capsule 0  . gabapentin (NEURONTIN) 100 MG capsule Take 1 capsule (100 mg total) by mouth 3 (three) times daily. 90 capsule 2  . HYDROmorphone (DILAUDID) 2 MG tablet Take 0.5 tablets (1 mg total) by mouth every 4 (four) hours as needed. 10 tablet 0  . lidocaine-prilocaine (EMLA) cream Apply to affected area once 30 g 3  . methylphenidate (RITALIN) 5 MG tablet Take 1 tablet (5 mg total) by mouth 2 (two) times daily with breakfast and lunch. 60 tablet 0  . mirtazapine (REMERON) 30 MG tablet Take 1 tablet (30 mg total) by mouth at bedtime. 30 tablet 1  . Multiple Vitamin (MULTIVITAMIN WITH MINERALS) TABS  tablet Take 1 tablet by mouth daily.    Marland Kitchen omeprazole (PRILOSEC) 20 MG capsule TAKE 1 CAPSULE (20 MG TOTAL) BY MOUTH 2 (TWO) TIMES DAILY BEFORE A MEAL. 60 capsule 6  . ondansetron (ZOFRAN) 8 MG tablet Take 1 tablet (8 mg total) by mouth 2 (two) times daily. Start the day after chemo for 2 days. Then take as needed for nausea or vomiting. 30 tablet 1  . prochlorperazine (COMPAZINE) 10 MG tablet TAKE 1 TABLET (10 MG TOTAL) BY MOUTH EVERY 6 (SIX) HOURS AS NEEDED (NAUSEA OR VOMITING). 30 tablet 1  . promethazine (PHENERGAN) 25 MG tablet Take 1 tablet (25 mg total) by mouth every 6 (six) hours as needed for nausea or vomiting. 15 tablet 0  . simethicone (MYLICON) 80 MG chewable tablet Chew 1 tablet (80 mg total) by mouth every 6 (six) hours as needed for flatulence. 30 tablet 0  . traZODone (DESYREL) 50 MG tablet TAKE 1 TO 2 TABLETS BY MOUTH AT BEDTIME FOR SLEEP 60 tablet 0   No current facility-administered  medications for this visit.    OBJECTIVE: Middle-aged white woman examined in a wheelchair Filed Vitals:   07/22/14 1505  BP: 110/70  Pulse: 90  Temp: 98.6 F (37 C)  Resp: 18     Body mass index is 23.23 kg/(m^2).    ECOG FS: 2 Filed Weights   07/22/14 1505  Weight: 131 lb 1.6 oz (59.467 kg)    Sclerae unicteric, EOMs intact Oropharynx clear and moist No cervical or supraclavicular adenopathy Lungs no rales or rhonchi Heart regular rate and rhythm Abd soft, nontender, positive bowel sounds MSK no focal spinal tenderness, no upper extremity lymphedema Neuro: well oriented, positive affect Breasts: right chest wall recurrencestatus post radiation as pictured  Image taken 06/30/14    Image taken 07/22/2014       LAB RESULTS:   Lab Results  Component Value Date   WBC 7.2 07/22/2014   NEUTROABS 5.7 07/22/2014   HGB 12.8 07/22/2014   HCT 39.1 07/22/2014   MCV 100.5 07/22/2014   PLT 199 07/22/2014      Chemistry      Component Value Date/Time   NA 141 07/22/2014 1412   NA 134* 05/20/2014 1114   K 4.6 07/22/2014 1412   K 3.7 05/20/2014 1114   CL 99 05/20/2014 1114   CO2 26 07/22/2014 1412   CO2 24 05/20/2014 1114   BUN 16.0 07/22/2014 1412   BUN 15 05/20/2014 1114   CREATININE 0.7 07/22/2014 1412   CREATININE 0.35* 05/20/2014 1114      Component Value Date/Time   CALCIUM 9.0 07/22/2014 1412   CALCIUM 8.4 05/20/2014 1114   ALKPHOS 78 07/22/2014 1412   ALKPHOS 69 05/20/2014 1114   AST 27 07/22/2014 1412   AST 30 05/20/2014 1114   ALT 31 07/22/2014 1412   ALT 28 05/20/2014 1114   BILITOT 0.28 07/22/2014 1412   BILITOT 0.5 05/20/2014 1114        STUDIES: Mr Jeri Cos Wo Contrast  21-Jul-2014   CLINICAL DATA:  Breast cancer with brain metastases. Status post radiation treatment. Whole brain radiation November 2015  EXAM: MRI HEAD WITHOUT AND WITH CONTRAST  TECHNIQUE: Multiplanar, multiecho pulse sequences of the brain and surrounding structures were  obtained without and with intravenous contrast.  CONTRAST:  65mL MULTIHANCE GADOBENATE DIMEGLUMINE 529 MG/ML IV SOLN  COMPARISON:  MRI brain 05/12/14 and 02/26/2014  FINDINGS: Multiple enhancing lesions are again noted. A 9.0 x 9.5 mm  lesion in the posterior right cerebellum is slightly increased in size compared to the prior study. This lesion had decreased in size previously.  A lesion in the posterior right temporal lobe is stable compared to the prior study. The lesion deep to the angular gyrus in the right parietal lobe is stable in size. There is a new punctate lesion in the posterior right parietal lobe which is new on image 37 of series 11. A lesion in the posterior frontal lobe on image 37 is stable to slightly decreased in size. A higher punctate right frontal lesion on image number 43 is stable. A punctate high a left frontal lobe lesion on image 49 is stable. No other new lesions are evident.  There is still some restricted diffusion associated with the posterior right frontal and parietal lesions on image 37. There is restricted diffusion associated with the new parietal lesion on image 37.  Diffuse periventricular T2 changes continue to progress within the white matter. Midline shift is stable at 2 mm right to left.  Flow is present in the major intracranial arteries. The globes and orbits are intact. The paranasal sinuses are clear. There is some fluid in left mastoid air cells without obstruction.  IMPRESSION: 1. Slight increase in size of the right cerebellar lesion which had previously decreased significantly. This is nonspecific. Recommend continued attention to this area. 2. Multiple other lesions are stable slightly decreased in size. 3. There is a new punctate lesion posteriorly in the right parietal lobe on image 37 of series 11. 4. Progressive diffuse white matter change. This is likely still related to post radiation changes. 5. Chronic left mastoid effusion. No obstructing nasopharyngeal  lesion is evident.   Electronically Signed   By: San Morelle M.D.   On: 07/20/2014 11:11      ASSESSMENT: 64 y.o. Carrie Berry, [place] woman status post right breast upper outer quadrant and right axillary lymph node biopsy 12/23/2012 for a clinical T3 N1 M1, stage IV invasive ductal carcinoma, grade 3, triple negative, with an MIB-1 of 86%.  (1) right liver lobe biopsy 01/21/2013 confirms metastatic adenocarcinoma; scans show involvement of the liver, lungs, and likely bone.  (2) enrolled in [facility] study [id number] (docetaxel + oral gamma secretase inibitor [id number]), received one cycle starting 02/04/2013  but withdrew because of poor tolerance despite treatment interruptions and decreased dosing of the oral component  (3)  chest CT in early November 2014 was compared with studies in August 2014 and showed interval progression of metastatic breast cancer, with an enlarging primary right breast mass, enlarging right axillary lymph nodes, enlarging pulmonary nodules and new/enlarging hepatic metastases.  (4) Abraxane started 03/19/2013, given day 1 and day 8 of each 21 day cycle, stopped with the day 1 cycle 3 dose (04/29/2013) because of local progression of disease  (5) cyclophosphamide and doxorubicin started 05/20/2013, given in dose dense fashion  with Neulasta support on day 2. Completed 4 cycles 07/03/2013, with the final cycle dose reduced 15% because of an episode of febrile neutropenia after cycle 3. Restaging studies 07/15/2013 showed partial response.  (6) started capecitabine April 2015, 1.5 g by mouth twice a day, 7 days on 7 days off--discontinued October 2015 with progression  (7) s/p Right simple mastectomy 02/17/2014 to optimize local control, the pathology ([id number]) showing an invasive ductal carcinoma measuring 4.8 cm, grade 3, with close but negative margins and evidence of treatment response   (a) nodule on right mastectomy scar noted 04/03/2014, removed 04/14/2014  (b)  new nodule noted on R chest scar 05/19/2014  (c) radiation to R chest wall completed 07/22/2014  (8) multiple brain metastases noted on brain MRI 02/26/2014--s/p whole brain irradiation completed 03/17/2014  (a) repeat brain MRI 05/12/2014 shows favorable response  (b) repeat brain MRI 07/15/2014 shows questionable growth/ stable disease  (9) protein-calorie malnutrition  (10) high fall risk  (11) advanced directives: not in place; husband is healthcare power of attorney  (12) unexplained paroxysmal knee pain: possible ruptured Baker's cyst, Left, 05/01/2014  (13) doxil started 05/28/2014, repeated every 28 days   PLAN: Carrie Berry tolerated her chest wall irradiation well and at least so far has not developed any radiation recall. She understands this is a rare complication but not an unheard of 1. If she develops any significant change suspicious for that she will let us know.  I reviewed her brain MRI with her. I am reading it is stable, but I contacted the navigator for the brain cancer program so her scan can be more formally reviewed by the group. If there are any suggestions or changes of course I will let me and the patient know.  As far as her peripheral disease she will receive her third cycle of Doxil tomorrow. She will have a fourth cycle and month from now. She will see me after the fourth cycle with a chest CT to assess response.  I am delighted that K is clinically improved. The Ritalin does seem to have made a significant change in her ability to concentrate. She has gained her weight back and if she gains any more weight we will consider cutting the Remeron to 15 mg at bedtime instead of 30.  Incidentally Carrie Berry felt a little bump in his own left axilla and he wanted me to check that today. There is a half a centimeter irregularity high in the left axilla, which I offered to review when he returns next month for Snow's visit Chauncey Cruel, MD   07/22/2014 3:15 PM

## 2014-07-23 ENCOUNTER — Ambulatory Visit: Payer: BC Managed Care – PPO

## 2014-07-23 ENCOUNTER — Telehealth: Payer: Self-pay | Admitting: Oncology

## 2014-07-23 ENCOUNTER — Other Ambulatory Visit: Payer: BC Managed Care – PPO

## 2014-07-23 ENCOUNTER — Other Ambulatory Visit: Payer: Self-pay | Admitting: *Deleted

## 2014-07-23 ENCOUNTER — Telehealth: Payer: Self-pay | Admitting: *Deleted

## 2014-07-23 NOTE — Telephone Encounter (Signed)
Left message to confirm treatment canceled for 03/10 and will be reschedule for 03/17. Sent message to schedule treatment.

## 2014-07-23 NOTE — Telephone Encounter (Signed)
Per staff message and POF I have scheduled appts. Advised scheduler of appts. JMW  

## 2014-07-28 ENCOUNTER — Other Ambulatory Visit: Payer: Self-pay | Admitting: *Deleted

## 2014-07-29 ENCOUNTER — Other Ambulatory Visit: Payer: Self-pay | Admitting: Oncology

## 2014-07-30 ENCOUNTER — Other Ambulatory Visit (HOSPITAL_BASED_OUTPATIENT_CLINIC_OR_DEPARTMENT_OTHER): Payer: BC Managed Care – PPO

## 2014-07-30 ENCOUNTER — Telehealth: Payer: Self-pay | Admitting: Oncology

## 2014-07-30 ENCOUNTER — Encounter: Payer: Self-pay | Admitting: Nurse Practitioner

## 2014-07-30 ENCOUNTER — Ambulatory Visit: Payer: BC Managed Care – PPO

## 2014-07-30 ENCOUNTER — Ambulatory Visit (HOSPITAL_BASED_OUTPATIENT_CLINIC_OR_DEPARTMENT_OTHER): Payer: BC Managed Care – PPO | Admitting: Nurse Practitioner

## 2014-07-30 ENCOUNTER — Ambulatory Visit (HOSPITAL_COMMUNITY)
Admission: RE | Admit: 2014-07-30 | Discharge: 2014-07-30 | Disposition: A | Discharge status: Home / Self Care | Payer: BC Managed Care – PPO | Source: Ambulatory Visit | Attending: Oncology | Admitting: Oncology

## 2014-07-30 ENCOUNTER — Encounter: Payer: Self-pay | Admitting: Oncology

## 2014-07-30 ENCOUNTER — Ambulatory Visit (HOSPITAL_BASED_OUTPATIENT_CLINIC_OR_DEPARTMENT_OTHER): Payer: BC Managed Care – PPO

## 2014-07-30 VITALS — BP 132/75 | HR 92 | Temp 98.1°F | Resp 18 | Ht 63.0 in | Wt 134.0 lb

## 2014-07-30 DIAGNOSIS — I5020 Unspecified systolic (congestive) heart failure: Secondary | ICD-10-CM

## 2014-07-30 DIAGNOSIS — R238 Other skin changes: Secondary | ICD-10-CM | POA: Insufficient documentation

## 2014-07-30 DIAGNOSIS — C50911 Malignant neoplasm of unspecified site of right female breast: Secondary | ICD-10-CM

## 2014-07-30 DIAGNOSIS — C50411 Malignant neoplasm of upper-outer quadrant of right female breast: Secondary | ICD-10-CM

## 2014-07-30 DIAGNOSIS — R748 Abnormal levels of other serum enzymes: Secondary | ICD-10-CM | POA: Insufficient documentation

## 2014-07-30 DIAGNOSIS — C787 Secondary malignant neoplasm of liver and intrahepatic bile duct: Secondary | ICD-10-CM

## 2014-07-30 DIAGNOSIS — L909 Atrophic disorder of skin, unspecified: Secondary | ICD-10-CM

## 2014-07-30 DIAGNOSIS — Z95828 Presence of other vascular implants and grafts: Secondary | ICD-10-CM

## 2014-07-30 DIAGNOSIS — C773 Secondary and unspecified malignant neoplasm of axilla and upper limb lymph nodes: Secondary | ICD-10-CM

## 2014-07-30 DIAGNOSIS — C7800 Secondary malignant neoplasm of unspecified lung: Secondary | ICD-10-CM

## 2014-07-30 DIAGNOSIS — C7931 Secondary malignant neoplasm of brain: Secondary | ICD-10-CM

## 2014-07-30 LAB — CBC WITH DIFFERENTIAL/PLATELET
BASO%: 0.4 % (ref 0.0–2.0)
Basophils Absolute: 0.1 10*3/uL (ref 0.0–0.1)
EOS%: 0.5 % (ref 0.0–7.0)
Eosinophils Absolute: 0.1 10*3/uL (ref 0.0–0.5)
HCT: 38.9 % (ref 34.8–46.6)
HGB: 12.8 g/dL (ref 11.6–15.9)
LYMPH#: 1.4 10*3/uL (ref 0.9–3.3)
LYMPH%: 11.8 % — AB (ref 14.0–49.7)
MCH: 33.3 pg (ref 25.1–34.0)
MCHC: 32.9 g/dL (ref 31.5–36.0)
MCV: 101.3 fL — ABNORMAL HIGH (ref 79.5–101.0)
MONO#: 1.1 10*3/uL — ABNORMAL HIGH (ref 0.1–0.9)
MONO%: 9.2 % (ref 0.0–14.0)
NEUT%: 78.1 % — AB (ref 38.4–76.8)
NEUTROS ABS: 9.5 10*3/uL — AB (ref 1.5–6.5)
PLATELETS: 201 10*3/uL (ref 145–400)
RBC: 3.84 10*6/uL (ref 3.70–5.45)
RDW: 17.1 % — ABNORMAL HIGH (ref 11.2–14.5)
WBC: 12.1 10*3/uL — AB (ref 3.9–10.3)

## 2014-07-30 LAB — COMPREHENSIVE METABOLIC PANEL (CC13)
ALBUMIN: 3.3 g/dL — AB (ref 3.5–5.0)
ALT: 248 U/L — ABNORMAL HIGH (ref 0–55)
ANION GAP: 13 meq/L — AB (ref 3–11)
AST: 127 U/L — AB (ref 5–34)
Alkaline Phosphatase: 144 U/L (ref 40–150)
BUN: 16.2 mg/dL (ref 7.0–26.0)
CO2: 24 mEq/L (ref 22–29)
Calcium: 9 mg/dL (ref 8.4–10.4)
Chloride: 103 mEq/L (ref 98–109)
Creatinine: 0.7 mg/dL (ref 0.6–1.1)
Glucose: 88 mg/dl (ref 70–140)
POTASSIUM: 3.5 meq/L (ref 3.5–5.1)
Sodium: 141 mEq/L (ref 136–145)
TOTAL PROTEIN: 6.4 g/dL (ref 6.4–8.3)
Total Bilirubin: 0.32 mg/dL (ref 0.20–1.20)

## 2014-07-30 LAB — TECHNOLOGIST REVIEW

## 2014-07-30 MED ORDER — TECHNETIUM TC 99M-LABELED RED BLOOD CELLS IV KIT
24.0000 | PACK | Freq: Once | INTRAVENOUS | Status: AC | PRN
  Administered 2014-07-30: 24 via INTRAVENOUS

## 2014-07-30 MED ORDER — SODIUM CHLORIDE 0.9 % IJ SOLN
10.0000 mL | INTRAMUSCULAR | Status: DC | PRN
  Administered 2014-07-30: 10 mL via INTRAVENOUS
  Filled 2014-07-30: qty 10

## 2014-07-30 MED ORDER — HEPARIN SOD (PORK) LOCK FLUSH 100 UNIT/ML IV SOLN
500.0000 [IU] | Freq: Once | INTRAVENOUS | Status: AC
  Administered 2014-07-30: 500 [IU] via INTRAVENOUS
  Filled 2014-07-30: qty 5

## 2014-07-30 NOTE — Progress Notes (Signed)
. Patient ID: Carrie Berry, female   DOB: Sep 18, 1950, 64 y.o.   MRN: [id number] ID: Carrie Berry OB: 02-17-1951  MR#: [id number]  CSN#:[id number]  PCP: [name], MD GYN:   SU: Carrie Berry OTHER MD: Thea Silversmith, Earle Gell, Christene Slates, Heath Gold  CHIEF COMPLAINT:  Metastatic Breast Cancer CURRENT TREATMENT: Doxil  BREAST CANCER HISTORY: From the originally intake note:  Carrie Berry noted a mass in her right breast December of 2013, but did not think much of it. It did grow some lower the summer, but she was keeping her grandchild at that time and was too busy so she did not bring it to Dr. Bernell List attention until August. He set her up for bilateral mammography and ultrasonography at [facility] 12/18/2012 and this measured, on the right, a large irregular lobulated mass in the upper outer quadrant which by ultrasound measured 5.7 cm. The right axilla showed some lymph nodes with thickened cortices. In the left breast there was a focal asymmetry at the depth, lateral to the nipple, and by ultrasound there was a 9 mm ill-defined hypoechoic lesion in this location. Biopsy of the left breast lesion showed only fibrocystic changes.  Biopsy of the right breast mass and right axillary adenopathy ([id number]) on 12/23/2012 showed both to be involved by invasive ductal carcinoma, grade 2, triple negative, with an MIB-1 of 86%.  MRI of the breast 12/27/2012 at [facility] showed in the right breast a mass abutting the pectoralis muscle without enhancement of the muscle measuring 4.8 cm. The satellite nodule measuring approximately 3 mm was also noted superior to the mass and several abnormal and enlarged right axillary lymph nodes were noted, one of which appeared to be necrotic. The largest node measured 2.0 cm. Unfortunately, numerous bilateral pulmonary nodules were also noted.  The patient's subsequent history is as detailed below   INTERVAL HISTORY: Carrie Berry returns today  for follow-up of her metastatic breast cancer, accompanied by her husband Carrie Berry. Today she is to restart cycle 3 of doxil pending the results of her MUGA. This test was performed because her last echo estimated her ejection fraction to only be 40-45%.   REVIEW OF SYSTEMS Carrie Berry denies fevers, chills, nausea, vomiting, or changes in bowel or bladder habits. Her appetite is healthy and she continues to gain weight. Her main complaint this week is pain and sensitivity to her back, shoulders, and chest, even when she is being touched gently. Sometimes this is a burning pain. She has had radiation, but not to any of the areas listed. She also has some skin break down to the top of her left and right auricle. She has not been wearing any O2 lately, so the tubing has not come into contact with these areas. The irritation makes it hard to sleep at night on a pillow. A detailed review of systems is otherwise stable.   PAST MEDICAL HISTORY: Past Medical History  Diagnosis Date  . Recurrent sinus infections   . Arthritis   . PONV (postoperative nausea and vomiting)   . GERD (gastroesophageal reflux disease)   . 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 Anxiety     no problems at present  . rt breast ca dx'd 12/2011    breast  . Breast cancer   . 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 Tubal ligation    .  Tubal ligation  1985  . Portacath placement N/A 01/10/2013    Procedure: ULTRASOUND GUIDED PORT-A-CATH INSERTION WITH FLUOROSCOPY;  Surgeon: [name], MD;  Location: [facility];  Service: General;  Laterality: N/A;  . Esophagogastroduodenoscopy N/A 08/19/2013    Procedure: ESOPHAGOGASTRODUODENOSCOPY (EGD);  Surgeon: Garlan Fair, MD;  Location: [facility];  Service: Endoscopy;  Laterality: N/A;  . Total mastectomy Right 02/17/2014    Procedure: PALLIATIVE  RIGHT MASTECTOMY;   Surgeon: Carrie Confer, MD;  Location: [facility];  Service: General;  Laterality: Right;    FAMILY HISTORY Family History  Problem Relation Age of Onset  . Bladder Cancer Father   . Colon cancer Maternal Grandmother    the patient's parents are living, both in their 78s. The patient's father was diagnosed with bladder cancer the age of 52. The patient's mother's mother had some type of gastrointestinal cancer. The patient had one brother, no sisters. There is no history of breast or ovarian cancer in the family other than a cousin on the father's side who was diagnosed with breast cancer apparently before the age of 19.  GYNECOLOGIC HISTORY:   (Reviewed 10/13/2013) Menarche age 38, first live birth age 17, the patient is Carrie Berry P2. She went through menopause in 2008. She did not take hormone replacement. She took birth control for approximately 22 years remotely.  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 She's currently on disability. Her husband Carrie Berry"), is vice Development worker, international aid for [employer]. The patient's daughter Carrie Berry is a stay-at-home mom in [place], the patient's son Carrie Berry unfortunately died in an automobile accident in December of 2011. The patient has 2 grandchildren. She attends to a local [employer].    ADVANCED DIRECTIVES: Not in place   HEALTH MAINTENANCE: (Updated 10/13/2013) History  Substance Use Topics  . Smoking status: Never Smoker   . Smokeless tobacco: Never Used  . Alcohol Use: No     Colonoscopy: 2005  PAP: Not on file  Bone density: Not on file  Lipid panel:  Not on file/Dr. Polite   Allergies  Allergen Reactions  . Ondansetron Hcl Other (See Comments)    HEADACHE, patient states she still takes   . Codeine Nausea And Vomiting    "violently ill"  . Penicillins Rash    Childhood reaction -    Current  Outpatient Prescriptions  Medication Sig Dispense Refill  . acyclovir (ZOVIRAX) 400 MG tablet Take 1 tablet (400 mg total) by mouth 2 (two) times daily. 60 tablet 6  . Cholecalciferol (VITAMIN D PO) Take 800 Units by mouth daily.    Marland Kitchen docusate sodium 100 MG CAPS Take 100 mg by mouth daily. (Patient taking differently: Take 200 mg by mouth daily. ) 10 capsule 0  . fluconazole (DIFLUCAN) 100 MG tablet Take 100 mg by mouth daily.    Marland Kitchen gabapentin (NEURONTIN) 100 MG capsule Take 1 capsule (100 mg total) by mouth 3 (three) times daily. 90 capsule 2  . lidocaine-prilocaine (EMLA) cream Apply to affected area once 30 g 3  . methylphenidate (RITALIN) 5 MG tablet Take 1 tablet (5 mg total) by mouth 2 (two) times daily with breakfast and lunch. 60 tablet 0  . mirtazapine (REMERON) 30 MG tablet Take 1 tablet (30 mg total) by mouth at bedtime. 30 tablet 1  . Multiple Vitamin (MULTIVITAMIN WITH MINERALS) TABS tablet Take 1 tablet by mouth daily.    Marland Kitchen  omeprazole (PRILOSEC) 20 MG capsule TAKE 1 CAPSULE (20 MG TOTAL) BY MOUTH 2 (TWO) TIMES DAILY BEFORE A MEAL. 60 capsule 6  . simethicone (MYLICON) 80 MG chewable tablet Chew 1 tablet (80 mg total) by mouth every 6 (six) hours as needed for flatulence. 30 tablet 0  . traZODone (DESYREL) 50 MG tablet TAKE 1 TO 2 TABLETS BY MOUTH AT BEDTIME FOR SLEEP 60 tablet 0  . diphenoxylate-atropine (LOMOTIL) 2.5-0.025 MG per tablet Take 1 tablet by mouth 4 (four) times daily as needed for diarrhea or loose stools. (Patient not taking: Reported on 07/30/2014) 30 tablet 0  . HYDROmorphone (DILAUDID) 2 MG tablet Take 0.5 tablets (1 mg total) by mouth every 4 (four) hours as needed. (Patient not taking: Reported on 07/30/2014) 10 tablet 0  . ondansetron (ZOFRAN) 8 MG tablet Take 1 tablet (8 mg total) by mouth 2 (two) times daily. Start the day after chemo for 2 days. Then take as needed for nausea or vomiting. (Patient not taking: Reported on 07/30/2014) 30 tablet 1  . prochlorperazine  (COMPAZINE) 10 MG tablet TAKE 1 TABLET (10 MG TOTAL) BY MOUTH EVERY 6 (SIX) HOURS AS NEEDED (NAUSEA OR VOMITING). (Patient not taking: Reported on 07/30/2014) 30 tablet 1  . promethazine (PHENERGAN) 25 MG tablet Take 1 tablet (25 mg total) by mouth every 6 (six) hours as needed for nausea or vomiting. (Patient not taking: Reported on 07/30/2014) 15 tablet 0   No current facility-administered medications for this visit.   Facility-Administered Medications Ordered in Other Visits  Medication Dose Route Frequency Provider Last Rate Last Dose  . sodium chloride 0.9 % injection 10 mL  10 mL Intravenous PRN Chauncey Cruel, MD   10 mL at 07/30/14 1123    OBJECTIVE: Middle-aged white Berry examined in a wheelchair Filed Vitals:   07/30/14 1139  BP: 132/75  Pulse: 92  Temp: 98.1 F (36.7 C)  Resp: 18     Body mass index is 23.74 kg/(m^2).    ECOG FS: 2 Filed Weights   07/30/14 1139  Weight: 134 lb (60.782 kg)    Skin: skin break down most noticeable to the tip of left auricle. Shoulders, chest, and upper back slightly erythematous, but no open lesions. Diffuse bruises. HEENT: sclerae anicteric, conjunctivae pink, oropharynx clear. No thrush or mucositis.  Lymph Nodes: No cervical or supraclavicular lymphadenopathy  Lungs: clear to auscultation bilaterally, no rales, wheezes, or rhonci  Heart: regular rate and rhythm  Abdomen: round, soft, non tender, positive bowel sounds  Musculoskeletal: No focal spinal tenderness, no peripheral edema  Neuro: non focal, well oriented, positive affect  Breasts: deferred. Right chest wall recurrence as pictured below  Image taken 06/30/14    Image taken 07/22/2014       LAB RESULTS:   Lab Results  Component Value Date   WBC 12.1* 07/30/2014   NEUTROABS 9.5* 07/30/2014   HGB 12.8 07/30/2014   HCT 38.9 07/30/2014   MCV 101.3* 07/30/2014   PLT 201 07/30/2014      Chemistry      Component Value Date/Time   NA 141 07/30/2014 1050   NA  134* 05/20/2014 1114   K 3.5 07/30/2014 1050   K 3.7 05/20/2014 1114   CL 99 05/20/2014 1114   CO2 24 07/30/2014 1050   CO2 24 05/20/2014 1114   BUN 16.2 07/30/2014 1050   BUN 15 05/20/2014 1114   CREATININE 0.7 07/30/2014 1050   CREATININE 0.35* 05/20/2014 1114  Component Value Date/Time   CALCIUM 9.0 07/30/2014 1050   CALCIUM 8.4 05/20/2014 1114   ALKPHOS 144 07/30/2014 1050   ALKPHOS 69 05/20/2014 1114   AST 127* 07/30/2014 1050   AST 30 05/20/2014 1114   ALT 248* 07/30/2014 1050   ALT 28 05/20/2014 1114   BILITOT 0.32 07/30/2014 1050   BILITOT 0.5 05/20/2014 1114        STUDIES: Mr Jeri Cos ZY Contrast  08/19/14   CLINICAL DATA:  Breast cancer with brain metastases. Status post radiation treatment. Whole brain radiation November 2015  EXAM: MRI HEAD WITHOUT AND WITH CONTRAST  TECHNIQUE: Multiplanar, multiecho pulse sequences of the brain and surrounding structures were obtained without and with intravenous contrast.  CONTRAST:  38mL MULTIHANCE GADOBENATE DIMEGLUMINE 529 MG/ML IV SOLN  COMPARISON:  MRI brain 05/12/14 and 02/26/2014  FINDINGS: Multiple enhancing lesions are again noted. A 9.0 x 9.5 mm lesion in the posterior right cerebellum is slightly increased in size compared to the prior study. This lesion had decreased in size previously.  A lesion in the posterior right temporal lobe is stable compared to the prior study. The lesion deep to the angular gyrus in the right parietal lobe is stable in size. There is a new punctate lesion in the posterior right parietal lobe which is new on image 37 of series 11. A lesion in the posterior frontal lobe on image 37 is stable to slightly decreased in size. A higher punctate right frontal lesion on image number 43 is stable. A punctate high a left frontal lobe lesion on image 49 is stable. No other new lesions are evident.  There is still some restricted diffusion associated with the posterior right frontal and parietal lesions on  image 37. There is restricted diffusion associated with the new parietal lesion on image 37.  Diffuse periventricular T2 changes continue to progress within the white matter. Midline shift is stable at 2 mm right to left.  Flow is present in the major intracranial arteries. The globes and orbits are intact. The paranasal sinuses are clear. There is some fluid in left mastoid air cells without obstruction.  IMPRESSION: 1. Slight increase in size of the right cerebellar lesion which had previously decreased significantly. This is nonspecific. Recommend continued attention to this area. 2. Multiple other lesions are stable slightly decreased in size. 3. There is a new punctate lesion posteriorly in the right parietal lobe on image 37 of series 11. 4. Progressive diffuse white matter change. This is likely still related to post radiation changes. 5. Chronic left mastoid effusion. No obstructing nasopharyngeal lesion is evident.   Electronically Signed   By: San Morelle M.D.   On: 08/19/2014 11:11   CLINICAL DATA: Systolic chest heart failure. Evaluate cardiac function prior to chemotherapy.  EXAM: NUCLEAR MEDICINE CARDIAC BLOOD POOL IMAGING (MUGA)  TECHNIQUE: Cardiac multi-gated acquisition was performed at rest following intravenous injection of Tc-79m labeled red blood cells.  RADIOPHARMACEUTICALS: Twenty-four mCiTc-3m in-vitro labeled red blood cells.  COMPARISON: None.  FINDINGS: No focal wall motion abnormality of the left ventricle.  Calculated left ventricular ejection fraction equals 63%  IMPRESSION: Left ventricular ejection fraction equals 63%   ASSESSMENT: 64 y.o. Carrie Berry, Carrie Berry status post right breast upper outer quadrant and right axillary lymph node biopsy 12/23/2012 for a clinical T3 N1 M1, stage IV invasive ductal carcinoma, grade 3, triple negative, with an MIB-1 of 86%.  (1) right liver lobe biopsy 01/21/2013 confirms metastatic adenocarcinoma; scans  show involvement of  the liver, lungs, and likely bone.  (2) enrolled in [facility] study [id number] (docetaxel + oral gamma secretase inibitor [id number]), received one cycle starting 02/04/2013  but withdrew because of poor tolerance despite treatment interruptions and decreased dosing of the oral component  (3)  chest CT in early November 2014 was compared with studies in August 2014 and showed interval progression of metastatic breast cancer, with an enlarging primary right breast mass, enlarging right axillary lymph nodes, enlarging pulmonary nodules and new/enlarging hepatic metastases.  (4) Abraxane started 03/19/2013, given day 1 and day 8 of each 21 day cycle, stopped with the day 1 cycle 3 dose (04/29/2013) because of local progression of disease  (5) cyclophosphamide and doxorubicin started 05/20/2013, given in dose dense fashion  with Neulasta support on day 2. Completed 4 cycles 07/03/2013, with the final cycle dose reduced 15% because of an episode of febrile neutropenia after cycle 3. Restaging studies 07/15/2013 showed partial response.  (6) started capecitabine April 2015, 1.5 g by mouth twice a day, 7 days on 7 days off--discontinued October 2015 with progression  (7) s/p Right simple mastectomy 02/17/2014 to optimize local control, the pathology ([facility]ZA [phone] [phone]) showing an invasive ductal carcinoma measuring 4.8 cm, grade 3, with close but negative margins and evidence of treatment response   (a) nodule on right mastectomy scar noted 04/03/2014, removed 04/14/2014  (b) new nodule noted on R chest scar 05/19/2014  (c) radiation to R chest wall completed 07/22/2014  (8) multiple brain metastases noted on brain MRI 02/26/2014--s/p whole brain irradiation completed 03/17/2014  (a) repeat brain MRI 05/12/2014 shows favorable response  (b) repeat brain MRI 07/15/2014 shows questionable growth/ stable disease  (9) protein-calorie malnutrition  (10) high fall risk  (11) advanced directives:  not in place; husband is healthcare power of attorney  (12) unexplained paroxysmal knee pain: possible ruptured Baker's cyst, Left, 05/01/2014  (13) doxil started 05/28/2014, repeated every 28 days. Cycle 3 delayed 2 weeks because of decrease in ejection fraction. To be restarted on 08/06/14.    PLAN: At the time of the visit, Carrie Berry's MUGA results had not been processed. As a result, we held treatment for this week and she was to return next week for reevaluation. This afternoon, the result were released and the ejection fraction was determined to be 63%. I consulted with Dr. [name] and with this news, he advises she proceed with cycle 3 when she returns next week. I called to inform the patient of this and she was agreeable. The dose will be increased to account for her weight gain of >10% of her baseline.   For her sensitive skin we discussed using aloe gel to her back and chest. She will use bactroban to the irritation to her bilateral auricles.   The labs were reviewed in detail and showed an increase in AST and ALT well above the normal ranges. This was reviewed with Dr. [name], and of course we will make a decision according to her next lab draw when she returns, but if these levels do not show much improvement, treatment must be held again.   Carrie Berry understands and agrees with this plan. She knows the goal of treatment in her case is control. She has been encouraged to call with any issues that might arise before her next visit here.   Carrie Panda, NP   07/30/2014 12:54 PM                                                                                                                                                                                                                                                                                                                                                                                                                                                                                                                                                                                                                                                                                                                                                                                                                                                                                                                                                                                                                                                                                                                                                                                                                                                                                                                                                                                                                                                                                                                                                                                                                                                                                                                                                                    `

## 2014-07-30 NOTE — Progress Notes (Signed)
I faxed prior auth request for omeprazole dr 20mg  caps[facility] to [facility].

## 2014-07-30 NOTE — Patient Instructions (Signed)

## 2014-07-30 NOTE — Progress Notes (Signed)
Patient in for labs and Port-A-Cath access today. Phlebotomist attempted to draw labs peripherally, but was unable to obtain blood after several attempts. Patient added to Flush schedule. Patient's port accessed and flushed without any difficulty. No blood return noted with flush. Patient's port left accessed and [name], NP made aware. Patient denies any pain or discomfort during or after flush. Patient in wheelchair and discharged from [place] with husband.

## 2014-07-30 NOTE — Telephone Encounter (Signed)
Gave and printed appt sched and avs for pt for March....SED added tx

## 2014-08-02 ENCOUNTER — Other Ambulatory Visit: Payer: Self-pay | Admitting: Oncology

## 2014-08-04 ENCOUNTER — Encounter: Payer: Self-pay | Admitting: Oncology

## 2014-08-04 NOTE — Progress Notes (Signed)
[facility] approved omeprazole dr capsule 20mg  06/30/14-07/30/15 case MI#[id number]. I sent to medical records

## 2014-08-06 ENCOUNTER — Ambulatory Visit (HOSPITAL_BASED_OUTPATIENT_CLINIC_OR_DEPARTMENT_OTHER): Payer: BC Managed Care – PPO

## 2014-08-06 ENCOUNTER — Ambulatory Visit (HOSPITAL_BASED_OUTPATIENT_CLINIC_OR_DEPARTMENT_OTHER): Payer: BC Managed Care – PPO | Admitting: Nurse Practitioner

## 2014-08-06 ENCOUNTER — Telehealth: Payer: Self-pay | Admitting: Nurse Practitioner

## 2014-08-06 ENCOUNTER — Encounter: Payer: Self-pay | Admitting: Nurse Practitioner

## 2014-08-06 ENCOUNTER — Ambulatory Visit: Payer: BC Managed Care – PPO

## 2014-08-06 ENCOUNTER — Other Ambulatory Visit (HOSPITAL_BASED_OUTPATIENT_CLINIC_OR_DEPARTMENT_OTHER): Payer: BC Managed Care – PPO

## 2014-08-06 VITALS — BP 121/78 | HR 100 | Temp 98.2°F | Resp 18 | Ht 63.0 in | Wt 134.4 lb

## 2014-08-06 DIAGNOSIS — M62830 Muscle spasm of back: Secondary | ICD-10-CM | POA: Insufficient documentation

## 2014-08-06 DIAGNOSIS — R748 Abnormal levels of other serum enzymes: Secondary | ICD-10-CM

## 2014-08-06 DIAGNOSIS — C787 Secondary malignant neoplasm of liver and intrahepatic bile duct: Secondary | ICD-10-CM | POA: Diagnosis not present

## 2014-08-06 DIAGNOSIS — C50411 Malignant neoplasm of upper-outer quadrant of right female breast: Secondary | ICD-10-CM

## 2014-08-06 DIAGNOSIS — Z95828 Presence of other vascular implants and grafts: Secondary | ICD-10-CM

## 2014-08-06 DIAGNOSIS — C50911 Malignant neoplasm of unspecified site of right female breast: Secondary | ICD-10-CM

## 2014-08-06 LAB — CBC WITH DIFFERENTIAL/PLATELET
BASO%: 0.3 % (ref 0.0–2.0)
Basophils Absolute: 0 10*3/uL (ref 0.0–0.1)
EOS%: 0.8 % (ref 0.0–7.0)
Eosinophils Absolute: 0.1 10*3/uL (ref 0.0–0.5)
HEMATOCRIT: 39.2 % (ref 34.8–46.6)
HGB: 12.7 g/dL (ref 11.6–15.9)
LYMPH#: 1.2 10*3/uL (ref 0.9–3.3)
LYMPH%: 12.3 % — ABNORMAL LOW (ref 14.0–49.7)
MCH: 33.1 pg (ref 25.1–34.0)
MCHC: 32.4 g/dL (ref 31.5–36.0)
MCV: 102.1 fL — ABNORMAL HIGH (ref 79.5–101.0)
MONO#: 1 10*3/uL — AB (ref 0.1–0.9)
MONO%: 9.6 % (ref 0.0–14.0)
NEUT#: 7.7 10*3/uL — ABNORMAL HIGH (ref 1.5–6.5)
NEUT%: 77 % — AB (ref 38.4–76.8)
Platelets: 199 10*3/uL (ref 145–400)
RBC: 3.84 10*6/uL (ref 3.70–5.45)
RDW: 16.5 % — ABNORMAL HIGH (ref 11.2–14.5)
WBC: 10 10*3/uL (ref 3.9–10.3)

## 2014-08-06 LAB — TECHNOLOGIST REVIEW

## 2014-08-06 LAB — COMPREHENSIVE METABOLIC PANEL (CC13)
ALT: 409 U/L — AB (ref 0–55)
ANION GAP: 11 meq/L (ref 3–11)
AST: 210 U/L (ref 5–34)
Albumin: 3.2 g/dL — ABNORMAL LOW (ref 3.5–5.0)
Alkaline Phosphatase: 234 U/L — ABNORMAL HIGH (ref 40–150)
BILIRUBIN TOTAL: 0.42 mg/dL (ref 0.20–1.20)
BUN: 14 mg/dL (ref 7.0–26.0)
CHLORIDE: 105 meq/L (ref 98–109)
CO2: 25 meq/L (ref 22–29)
CREATININE: 0.6 mg/dL (ref 0.6–1.1)
Calcium: 9 mg/dL (ref 8.4–10.4)
EGFR: >90 mL/min/{1.73_m2} (ref >90)
Glucose: 86 mg/dl (ref 70–140)
Potassium: 3.6 mEq/L (ref 3.5–5.1)
Sodium: 141 mEq/L (ref 136–145)
Total Protein: 6.3 g/dL — ABNORMAL LOW (ref 6.4–8.3)

## 2014-08-06 MED ORDER — SODIUM CHLORIDE 0.9 % IJ SOLN
10.0000 mL | INTRAMUSCULAR | Status: DC | PRN
  Administered 2014-08-06: 10 mL via INTRAVENOUS
  Filled 2014-08-06: qty 10

## 2014-08-06 MED ORDER — HEPARIN SOD (PORK) LOCK FLUSH 100 UNIT/ML IV SOLN
500.0000 [IU] | Freq: Once | INTRAVENOUS | Status: AC
  Administered 2014-08-06: 500 [IU] via INTRAVENOUS
  Filled 2014-08-06: qty 5

## 2014-08-06 MED ORDER — CYCLOBENZAPRINE HCL 5 MG PO TABS: 5.0000 mg | ORAL_TABLET | Freq: Three times a day (TID) | ORAL | Status: DC | PRN

## 2014-08-06 NOTE — Addendum Note (Signed)
Addended by: Daisy Floro R on: 08/06/2014 11:34 AM   Modules accepted: Orders, SmartSet

## 2014-08-06 NOTE — Telephone Encounter (Signed)
per pof to sch pt appt-adv [facility] will call to [facility] MRI-did lab add on

## 2014-08-06 NOTE — Progress Notes (Signed)
. Patient ID: Carrie Berry, female   DOB: October 24, 1950, 64 y.o.   MRN: [id number] ID: Carrie Berry OB: [birth year]  MR#: [id number]  CSN#:[id number]  PCP: [name], MD GYN:   SU: [name] OTHER MD: Thea Silversmith, Earle Gell, Christene Slates, Heath Gold  CHIEF COMPLAINT:  Metastatic Breast Cancer CURRENT TREATMENT: Doxil  BREAST CANCER HISTORY: From the originally intake note:  Carrie Berry noted a mass in her right breast December of 2013, but did not think much of it. It did grow some lower the summer, but she was keeping her grandchild at that time and was too busy so she did not bring it to Dr. Bernell List attention until August. He set her up for bilateral mammography and ultrasonography at [facility] 12/18/2012 and this measured, on the right, a large irregular lobulated mass in the upper outer quadrant which by ultrasound measured 5.7 cm. The right axilla showed some lymph nodes with thickened cortices. In the left breast there was a focal asymmetry at the depth, lateral to the nipple, and by ultrasound there was a 9 mm ill-defined hypoechoic lesion in this location. Biopsy of the left breast lesion showed only fibrocystic changes.  Biopsy of the right breast mass and right axillary adenopathy ([id number]) on 12/23/2012 showed both to be involved by invasive ductal carcinoma, grade 2, triple negative, with an MIB-1 of 86%.  MRI of the breast 12/27/2012 at [facility] imaging showed in the right breast a mass abutting the pectoralis muscle without enhancement of the muscle measuring 4.8 cm. The satellite nodule measuring approximately 3 mm was also noted superior to the mass and several abnormal and enlarged right axillary lymph nodes were noted, one of which appeared to be necrotic. The largest node measured 2.0 cm. Unfortunately, numerous bilateral pulmonary nodules were also noted.  The patient's subsequent history is as detailed below   INTERVAL HISTORY: Carrie Berry returns today  for follow-up of her metastatic breast cancer, accompanied by her husband Carrie Berry. Today she is to restart cycle 3 of doxil. Last week was held because the MUGA results had not returned yet. The MUGA returned normal, but unfortunately, her AST and ALT levels are critical today.  REVIEW OF SYSTEMS Carrie Berry denies fevers, chills, nausea, vomiting, or changes in bowel or bladder habits. Her appetite is healthy and she is up 20lb since January. She sensitivity to her back is much improved with the use of a mineral exfoliant. Her husband is able to rub and bath her back without pain. She continues to have break down to the tip of her left auricle, but the right is almost completely gone. She has experienced back spasms this week. Her abdominal bloating and gassiness, is improved on simethicone. She denies shortness of breath, chest pain ,cough, or palpitations. She is fatigued nearly every day. She sleeps well past noon if her husband would let her. A detailed review of systems is otherwise stable.   PAST MEDICAL HISTORY: Past Medical History  Diagnosis Date  . Recurrent sinus infections   . Arthritis   . PONV (postoperative nausea and vomiting)   . GERD (gastroesophageal reflux disease)   . 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 Anxiety     no problems at present  . rt breast ca dx'd 12/2011    breast  . Breast cancer   . 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  Tubal ligation    . Tubal ligation  1985  . Portacath placement N/A 01/10/2013    Procedure: ULTRASOUND GUIDED PORT-A-CATH INSERTION WITH FLUOROSCOPY;  Surgeon: [name], MD;  Location: [place];  Service: General;  Laterality: N/A;  . Esophagogastroduodenoscopy N/A 08/19/2013    Procedure: ESOPHAGOGASTRODUODENOSCOPY (EGD);  Surgeon: Garlan Fair, MD;  Location: [facility];  Service: Endoscopy;  Laterality: N/A;    . Total mastectomy Right 02/17/2014    Procedure: PALLIATIVE  RIGHT MASTECTOMY;  Surgeon: [name], MD;  Location: [place];  Service: General;  Laterality: Right;    FAMILY HISTORY Family History  Problem Relation Age of Onset  . Bladder Cancer Father   . Colon cancer Maternal Grandmother    the patient's parents are living, both in their 39s. The patient's father was diagnosed with bladder cancer the age of 23. The patient's mother's mother had some type of gastrointestinal cancer. The patient had one brother, no sisters. There is no history of breast or ovarian cancer in the family other than a cousin on the father's side who was diagnosed with breast cancer apparently before the age of 3.  GYNECOLOGIC HISTORY:   (Reviewed 10/13/2013) Menarche age 75, first live birth age 89, the patient is [place] P2. She went through menopause in 2008. She did not take hormone replacement. She took birth control for approximately 22 years remotely.  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 She's currently on disability. Her husband Carrie Berry"), is vice Development worker, international aid for [employer]. The patient's daughter Carrie Berry is a stay-at-home mom in [place], the patient's son Carrie Berry unfortunately died in an automobile accident in December of 2011. The patient has 2 grandchildren. She attends to a local [employer].    ADVANCED DIRECTIVES: Not in place   HEALTH MAINTENANCE: (Updated 10/13/2013) History  Substance Use Topics  . Smoking status: Never Smoker   . Smokeless tobacco: Never Used  . Alcohol Use: No     Colonoscopy: 2005  PAP: Not on file  Bone density: Not on file  Lipid panel:  Not on file/Dr. Polite   Allergies  Allergen Reactions  . Ondansetron Hcl Other (See Comments)    HEADACHE, patient states she still takes   . Codeine Nausea And Vomiting     "violently ill"  . Penicillins Rash    Childhood reaction -    Current Outpatient Prescriptions  Medication Sig Dispense Refill  . acyclovir (ZOVIRAX) 400 MG tablet Take 1 tablet (400 mg total) by mouth 2 (two) times daily. 60 tablet 6  . cetirizine (ZYRTEC) 10 MG tablet Take 10 mg by mouth daily.    . Cholecalciferol (VITAMIN D PO) Take 800 Units by mouth daily.    Marland Kitchen docusate sodium 100 MG CAPS Take 100 mg by mouth daily. (Patient taking differently: Take 200 mg by mouth daily. ) 10 capsule 0  . fluconazole (DIFLUCAN) 100 MG tablet Take 100 mg by mouth daily.    Marland Kitchen gabapentin (NEURONTIN) 100 MG capsule Take 1 capsule (100 mg total) by mouth 3 (three) times daily. 90 capsule 2  . lidocaine-prilocaine (EMLA) cream Apply to affected area once 30 g 3  . methylphenidate (RITALIN) 5 MG tablet Take 1 tablet (5 mg total) by mouth 2 (two) times daily with breakfast and lunch. 60 tablet 0  . mirtazapine (REMERON) 30 MG tablet Take 1 tablet (30 mg total) by  mouth at bedtime. 30 tablet 1  . Multiple Vitamin (MULTIVITAMIN WITH MINERALS) TABS tablet Take 1 tablet by mouth daily.    Marland Kitchen omeprazole (PRILOSEC) 20 MG capsule TAKE 1 CAPSULE (20 MG TOTAL) BY MOUTH 2 (TWO) TIMES DAILY BEFORE A MEAL. 60 capsule 6  . simethicone (MYLICON) 80 MG chewable tablet Chew 1 tablet (80 mg total) by mouth every 6 (six) hours as needed for flatulence. 30 tablet 0  . traZODone (DESYREL) 50 MG tablet TAKE 1 TO 2 TABLETS BY MOUTH AT BEDTIME FOR SLEEP 60 tablet 0  . traZODone (DESYREL) 50 MG tablet TAKE 1 TO 2 TABLETS BY MOUTH AT BEDTIME FOR SLEEP 60 tablet 0  . cyclobenzaprine (FLEXERIL) 5 MG tablet Take 1 tablet (5 mg total) by mouth 3 (three) times daily as needed for muscle spasms. 30 tablet 0  . diphenoxylate-atropine (LOMOTIL) 2.5-0.025 MG per tablet Take 1 tablet by mouth 4 (four) times daily as needed for diarrhea or loose stools. (Patient not taking: Reported on 07/30/2014) 30 tablet 0  . HYDROmorphone (DILAUDID) 2 MG  tablet Take 0.5 tablets (1 mg total) by mouth every 4 (four) hours as needed. (Patient not taking: Reported on 07/30/2014) 10 tablet 0  . ondansetron (ZOFRAN) 8 MG tablet Take 1 tablet (8 mg total) by mouth 2 (two) times daily. Start the day after chemo for 2 days. Then take as needed for nausea or vomiting. (Patient not taking: Reported on 07/30/2014) 30 tablet 1  . prochlorperazine (COMPAZINE) 10 MG tablet TAKE 1 TABLET (10 MG TOTAL) BY MOUTH EVERY 6 (SIX) HOURS AS NEEDED (NAUSEA OR VOMITING). (Patient not taking: Reported on 07/30/2014) 30 tablet 1  . promethazine (PHENERGAN) 25 MG tablet Take 1 tablet (25 mg total) by mouth every 6 (six) hours as needed for nausea or vomiting. (Patient not taking: Reported on 07/30/2014) 15 tablet 0   No current facility-administered medications for this visit.    OBJECTIVE: Middle-aged white woman examined in a wheelchair Filed Vitals:   08/06/14 1014  BP: 121/78  Pulse: 100  Temp: 98.2 F (36.8 C)  Resp: 18     Body mass index is 23.81 kg/(m^2).    ECOG FS: 2 Filed Weights   08/06/14 1014  Weight: 134 lb 6.4 oz (60.963 kg)    Skin: skin break down most noticeable to the tip of left auricle. Shoulders, chest, and upper back slightly erythematous, but no open lesions. Diffuse bruises. HEENT: sclerae anicteric, conjunctivae pink, oropharynx clear. No thrush or mucositis.  Lymph Nodes: No cervical or supraclavicular lymphadenopathy  Lungs: clear to auscultation bilaterally, no rales, wheezes, or rhonci  Heart: regular rate and rhythm  Abdomen: round, soft, non tender, positive bowel sounds  Musculoskeletal: No focal spinal tenderness, no peripheral edema  Neuro: non focal, well oriented, positive affect  Breasts: deferred. Right chest wall recurrence as pictured below  Image taken 06/30/14    Image taken 07/22/2014       LAB RESULTS:   Lab Results  Component Value Date   WBC 10.0 08/06/2014   NEUTROABS 7.7* 08/06/2014   HGB 12.7  08/06/2014   HCT 39.2 08/06/2014   MCV 102.1* 08/06/2014   PLT 199 08/06/2014      Chemistry      Component Value Date/Time   NA 141 07/30/2014 1050   NA 134* 05/20/2014 1114   K 3.5 07/30/2014 1050   K 3.7 05/20/2014 1114   CL 99 05/20/2014 1114   CO2 24 07/30/2014 1050   CO2  24 05/20/2014 1114   BUN 16.2 07/30/2014 1050   BUN 15 05/20/2014 1114   CREATININE 0.7 07/30/2014 1050   CREATININE 0.35* 05/20/2014 1114      Component Value Date/Time   CALCIUM 9.0 07/30/2014 1050   CALCIUM 8.4 05/20/2014 1114   ALKPHOS 144 07/30/2014 1050   ALKPHOS 69 05/20/2014 1114   AST 127* 07/30/2014 1050   AST 30 05/20/2014 1114   ALT 248* 07/30/2014 1050   ALT 28 05/20/2014 1114   BILITOT 0.32 07/30/2014 1050   BILITOT 0.5 05/20/2014 1114        STUDIES: Mr Jeri Cos Wo Contrast  2014-07-23   CLINICAL DATA:  Breast cancer with brain metastases. Status post radiation treatment. Whole brain radiation November 2015  EXAM: MRI HEAD WITHOUT AND WITH CONTRAST  TECHNIQUE: Multiplanar, multiecho pulse sequences of the brain and surrounding structures were obtained without and with intravenous contrast.  CONTRAST:  62mL MULTIHANCE GADOBENATE DIMEGLUMINE 529 MG/ML IV SOLN  COMPARISON:  MRI brain 05/12/14 and 02/26/2014  FINDINGS: Multiple enhancing lesions are again noted. A 9.0 x 9.5 mm lesion in the posterior right cerebellum is slightly increased in size compared to the prior study. This lesion had decreased in size previously.  A lesion in the posterior right temporal lobe is stable compared to the prior study. The lesion deep to the angular gyrus in the right parietal lobe is stable in size. There is a new punctate lesion in the posterior right parietal lobe which is new on image 37 of series 11. A lesion in the posterior frontal lobe on image 37 is stable to slightly decreased in size. A higher punctate right frontal lesion on image number 43 is stable. A punctate high a left frontal lobe lesion on  image 49 is stable. No other new lesions are evident.  There is still some restricted diffusion associated with the posterior right frontal and parietal lesions on image 37. There is restricted diffusion associated with the new parietal lesion on image 37.  Diffuse periventricular T2 changes continue to progress within the white matter. Midline shift is stable at 2 mm right to left.  Flow is present in the major intracranial arteries. The globes and orbits are intact. The paranasal sinuses are clear. There is some fluid in left mastoid air cells without obstruction.  IMPRESSION: 1. Slight increase in size of the right cerebellar lesion which had previously decreased significantly. This is nonspecific. Recommend continued attention to this area. 2. Multiple other lesions are stable slightly decreased in size. 3. There is a new punctate lesion posteriorly in the right parietal lobe on image 37 of series 11. 4. Progressive diffuse white matter change. This is likely still related to post radiation changes. 5. Chronic left mastoid effusion. No obstructing nasopharyngeal lesion is evident.   Electronically Signed   By: San Morelle M.D.   On: 07-23-14 11:11   Nm Cardiac Muga Rest  07/30/2014   CLINICAL DATA:  Systolic chest heart failure. Evaluate cardiac function prior to chemotherapy.  EXAM: NUCLEAR MEDICINE CARDIAC BLOOD POOL IMAGING (MUGA)  TECHNIQUE: Cardiac multi-gated acquisition was performed at rest following intravenous injection of Tc-79m labeled red blood cells.  RADIOPHARMACEUTICALS:  Twenty-four mCiTc-67m in-vitro labeled red blood cells.  COMPARISON:  None.  FINDINGS: No focal wall motion abnormality of the left ventricle.  Calculated left ventricular ejection fraction equals 63%  IMPRESSION: Left ventricular ejection fraction equals 63%   Electronically Signed   By: Suzy Bouchard M.D.   On:  07/30/2014 13:31   CLINICAL DATA: Systolic chest heart failure. Evaluate cardiac function prior  to chemotherapy.  EXAM: NUCLEAR MEDICINE CARDIAC BLOOD POOL IMAGING (MUGA)  TECHNIQUE: Cardiac multi-gated acquisition was performed at rest following intravenous injection of Tc-45m labeled red blood cells.  RADIOPHARMACEUTICALS: Twenty-four mCiTc-82m in-vitro labeled red blood cells.  COMPARISON: None.  FINDINGS: No focal wall motion abnormality of the left ventricle.  Calculated left ventricular ejection fraction equals 63%  IMPRESSION: Left ventricular ejection fraction equals 63%    ASSESSMENT: 64 y.o. Carrie Berry, [place] woman status post right breast upper outer quadrant and right axillary lymph node biopsy 12/23/2012 for a clinical T3 N1 M1, stage IV invasive ductal carcinoma, grade 3, triple negative, with an MIB-1 of 86%.  (1) right liver lobe biopsy 01/21/2013 confirms metastatic adenocarcinoma; scans show involvement of the liver, lungs, and likely bone.  (2) enrolled in [facility] study [id number] (docetaxel + oral gamma secretase inibitor [id number]), received one cycle starting 02/04/2013  but withdrew because of poor tolerance despite treatment interruptions and decreased dosing of the oral component  (3)  chest CT in early November 2014 was compared with studies in August 2014 and showed interval progression of metastatic breast cancer, with an enlarging primary right breast mass, enlarging right axillary lymph nodes, enlarging pulmonary nodules and new/enlarging hepatic metastases.  (4) Abraxane started 03/19/2013, given day 1 and day 8 of each 21 day cycle, stopped with the day 1 cycle 3 dose (04/29/2013) because of local progression of disease  (5) cyclophosphamide and doxorubicin started 05/20/2013, given in dose dense fashion  with Neulasta support on day 2. Completed 4 cycles 07/03/2013, with the final cycle dose reduced 15% because of an episode of febrile neutropenia after cycle 3. Restaging studies 07/15/2013 showed partial response.  (6) started capecitabine  April 2015, 1.5 g by mouth twice a day, 7 days on 7 days off--discontinued October 2015 with progression  (7) s/p Right simple mastectomy 02/17/2014 to optimize local control, the pathology ([id number]) showing an invasive ductal carcinoma measuring 4.8 cm, grade 3, with close but negative margins and evidence of treatment response   (a) nodule on right mastectomy scar noted 04/03/2014, removed 04/14/2014  (b) new nodule noted on R chest scar 05/19/2014  (c) radiation to R chest wall completed 07/22/2014  (8) multiple brain metastases noted on brain MRI 02/26/2014--s/p whole brain irradiation completed 03/17/2014  (a) repeat brain MRI 05/12/2014 shows favorable response  (b) repeat brain MRI 07/15/2014 shows questionable growth/ stable disease  (9) protein-calorie malnutrition  (10) high fall risk  (11) advanced directives: not in place; husband is healthcare power of attorney  (12) unexplained paroxysmal knee pain: possible ruptured Baker's cyst, Left, 05/01/2014  (13) doxil started 05/28/2014, repeated every 28 days. Cycle 3 delayed 2 weeks because of decrease in ejection fraction. To be restarted on 08/06/14.    PLAN:      Laurie Panda, NP   08/06/2014 10:35 AM                                                                                                                                                                                                                                                                                                                                                                                                                                                                                                                                                                                                                                                                                                                                                                                                                                                                                                                                                                                                                                                                                                                                                                                                                                                                                                                                                                                                                                                                                                                                                                                                                                                                                                                                                                    ` .  Patient ID: RENELLA STEIG, female   DOB: Dec 26, 1950, 64 y.o.   MRN: [id number] ID: Carrie Berry OB: 1950/11/15  MR#: [id number]  CSN#:[id number]  PCP: [name], MD GYN:   SU: [name] OTHER MD: Thea Silversmith, Earle Gell, Christene Slates, Heath Gold  CHIEF COMPLAINT:  Metastatic Breast Cancer CURRENT TREATMENT: Doxil  BREAST CANCER HISTORY: From the originally intake note:  [place] noted a mass in her right breast December of 2013, but did not think much of it. It did grow some lower the summer, but she was keeping her grandchild at that time and was too busy so she did not bring it to Dr. Bernell List attention until August. He set her up for bilateral mammography and ultrasonography at [facility] 12/18/2012 and this measured, on the right, a large irregular lobulated mass in the upper outer quadrant which by ultrasound measured 5.7 cm. The right axilla showed some lymph nodes with thickened cortices. In the left breast there was a  focal asymmetry at the depth, lateral to the nipple, and by ultrasound there was a 9 mm ill-defined hypoechoic lesion in this location. Biopsy of the left breast lesion showed only fibrocystic changes.  Biopsy of the right breast mass and right axillary adenopathy ([id number]) on 12/23/2012 showed both to be involved by invasive ductal carcinoma, grade 2, triple negative, with an MIB-1 of 86%.  MRI of the breast 12/27/2012 at [facility] imaging showed in the right breast a mass abutting the pectoralis muscle without enhancement of the muscle measuring 4.8 cm. The satellite nodule measuring approximately 3 mm was also noted superior to the mass and several abnormal and enlarged right axillary lymph nodes were noted, one of which appeared to be necrotic. The largest node measured 2.0 cm. Unfortunately, numerous bilateral pulmonary nodules were also noted.  The patient's subsequent history is as detailed below   INTERVAL HISTORY: Carrie Berry returns today for follow-up of her metastatic breast cancer, accompanied by her husband Carrie Berry. Today she is to restart cycle 3 of doxil pending the results of her MUGA. This test was performed because her last echo estimated her ejection fraction to only be 40-45%.   REVIEW OF SYSTEMS Carrie Berry denies fevers, chills, nausea, vomiting, or changes in bowel or bladder habits. Her appetite is healthy and she continues to gain weight. Her main complaint this week is pain and sensitivity to her back, shoulders, and chest, even when she is being touched gently. Sometimes this is a burning pain. She has had radiation, but not to any of the areas listed. She also has some skin break down to the top of her left and right auricle. She has not been wearing any O2 lately, so the tubing has not come into contact with these areas. The irritation makes it hard to sleep at night on a pillow. A detailed review of systems is otherwise stable.   PAST MEDICAL HISTORY: Past Medical History    Diagnosis Date  . Recurrent sinus infections   . Arthritis   . PONV (postoperative nausea and vomiting)   . GERD (gastroesophageal reflux disease)   . 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 Anxiety     no problems at present  . rt breast ca dx'd 12/2011    breast  . Breast cancer   . 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 Tubal ligation    .  Tubal ligation  1985  . Portacath placement N/A 01/10/2013    Procedure: ULTRASOUND GUIDED PORT-A-CATH INSERTION WITH FLUOROSCOPY;  Surgeon: [name], MD;  Location: [place];  Service: General;  Laterality: N/A;  . Esophagogastroduodenoscopy N/A 08/19/2013    Procedure: ESOPHAGOGASTRODUODENOSCOPY (EGD);  Surgeon: Garlan Fair, MD;  Location: [facility];  Service: Endoscopy;  Laterality: N/A;  . Total mastectomy Right 02/17/2014    Procedure: PALLIATIVE  RIGHT MASTECTOMY;  Surgeon: [name], MD;  Location: [place];  Service: General;  Laterality: Right;    FAMILY HISTORY Family History  Problem Relation Age of Onset  . Bladder Cancer Father   . Colon cancer Maternal Grandmother    the patient's parents are living, both in their 71s. The patient's father was diagnosed with bladder cancer the age of 76. The patient's mother's mother had some type of gastrointestinal cancer. The patient had one brother, no sisters. There is no history of breast or ovarian cancer in the family other than a cousin on the father's side who was diagnosed with breast cancer apparently before the age of 21.  GYNECOLOGIC HISTORY:   (Reviewed 10/13/2013) Menarche age 67, first live birth age 22, the patient is [place] P2. She went through menopause in 2008. She did not take hormone replacement. She took birth control for approximately 22 years remotely.  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 Her husband Yuliana Vandrunen Carrie Berry"), is vice Development worker, international aid for [employer]. The patient's daughter Carrie Berry is a stay-at-home mom in [place], the patient's son Carrie Berry unfortunately died in an automobile accident in December of 2011. The patient has 2 grandchildren. She attends to a local [employer].    ADVANCED DIRECTIVES: Not in place   HEALTH MAINTENANCE: (Updated 10/13/2013) History  Substance Use Topics  . Smoking status: Never Smoker   . Smokeless tobacco: Never Used  . Alcohol Use: No     Colonoscopy: 2005  PAP: Not on file  Bone density: Not on file  Lipid panel:  Not on file/Dr. Polite   Allergies  Allergen Reactions  . Ondansetron Hcl Other (See Comments)    HEADACHE, patient states she still takes   . Codeine Nausea And Vomiting    "violently ill"  . Penicillins Rash    Childhood reaction -    Current Outpatient Prescriptions  Medication Sig Dispense Refill  . acyclovir (ZOVIRAX) 400 MG tablet Take 1 tablet (400 mg total) by mouth 2 (two) times daily. 60 tablet 6  . cetirizine (ZYRTEC) 10 MG tablet Take 10 mg by mouth daily.    . Cholecalciferol (VITAMIN D PO) Take 800 Units by mouth daily.    Marland Kitchen docusate sodium 100 MG CAPS Take 100 mg by mouth daily. (Patient taking differently: Take 200 mg by mouth daily. ) 10 capsule 0  . fluconazole (DIFLUCAN) 100 MG tablet Take 100 mg by mouth daily.    Marland Kitchen gabapentin (NEURONTIN) 100 MG capsule Take 1 capsule (100 mg total) by mouth 3 (three) times daily. 90 capsule 2  . lidocaine-prilocaine (EMLA) cream Apply to affected area once 30 g 3  . methylphenidate (RITALIN) 5 MG tablet Take 1 tablet (5 mg total) by mouth 2 (two) times daily with breakfast and lunch. 60 tablet 0  . mirtazapine (REMERON) 30 MG tablet Take 1 tablet (30 mg total) by mouth at bedtime. 30 tablet 1  .  Multiple Vitamin (MULTIVITAMIN WITH  MINERALS) TABS tablet Take 1 tablet by mouth daily.    . Omega-3 Krill Oil 300 MG CAPS Take by mouth.    Marland Kitchen omeprazole (PRILOSEC) 20 MG capsule TAKE 1 CAPSULE (20 MG TOTAL) BY MOUTH 2 (TWO) TIMES DAILY BEFORE A MEAL. 60 capsule 6  . simethicone (MYLICON) 80 MG chewable tablet Chew 1 tablet (80 mg total) by mouth every 6 (six) hours as needed for flatulence. 30 tablet 0  . traZODone (DESYREL) 50 MG tablet TAKE 1 TO 2 TABLETS BY MOUTH AT BEDTIME FOR SLEEP 60 tablet 0  . traZODone (DESYREL) 50 MG tablet TAKE 1 TO 2 TABLETS BY MOUTH AT BEDTIME FOR SLEEP 60 tablet 0  . cyclobenzaprine (FLEXERIL) 5 MG tablet Take 1 tablet (5 mg total) by mouth 3 (three) times daily as needed for muscle spasms. 30 tablet 0  . diphenoxylate-atropine (LOMOTIL) 2.5-0.025 MG per tablet Take 1 tablet by mouth 4 (four) times daily as needed for diarrhea or loose stools. (Patient not taking: Reported on 07/30/2014) 30 tablet 0  . HYDROmorphone (DILAUDID) 2 MG tablet Take 0.5 tablets (1 mg total) by mouth every 4 (four) hours as needed. (Patient not taking: Reported on 07/30/2014) 10 tablet 0  . ondansetron (ZOFRAN) 8 MG tablet Take 1 tablet (8 mg total) by mouth 2 (two) times daily. Start the day after chemo for 2 days. Then take as needed for nausea or vomiting. (Patient not taking: Reported on 07/30/2014) 30 tablet 1  . prochlorperazine (COMPAZINE) 10 MG tablet TAKE 1 TABLET (10 MG TOTAL) BY MOUTH EVERY 6 (SIX) HOURS AS NEEDED (NAUSEA OR VOMITING). (Patient not taking: Reported on 07/30/2014) 30 tablet 1  . promethazine (PHENERGAN) 25 MG tablet Take 1 tablet (25 mg total) by mouth every 6 (six) hours as needed for nausea or vomiting. (Patient not taking: Reported on 07/30/2014) 15 tablet 0   No current facility-administered medications for this visit.    OBJECTIVE: Middle-aged white woman examined in a wheelchair Filed Vitals:   08/06/14 1014  BP: 121/78  Pulse: 100  Temp: 98.2 F (36.8 C)  Resp: 18     Body mass index is  23.81 kg/(m^2).    ECOG FS: 2 Filed Weights   08/06/14 1014  Weight: 134 lb 6.4 oz (60.963 kg)    Skin: skin break down most noticeable to the tip of left auricle.  Sclerae unicteric, pupils equal and reactive Oropharynx clear and moist-- no thrush No cervical or supraclavicular adenopathy Lungs no rales or rhonchi Heart regular rate and rhythm Abd soft, nontender, positive bowel sounds MSK no focal spinal tenderness, no upper extremity lymphedema Neuro: nonfocal, well oriented, appropriate affect Breasts: deferred. Right chest wall as pictured below   Image taken 06/30/14    Image taken 07/22/2014       LAB RESULTS:   Lab Results  Component Value Date   WBC 10.0 08/06/2014   NEUTROABS 7.7* 08/06/2014   HGB 12.7 08/06/2014   HCT 39.2 08/06/2014   MCV 102.1* 08/06/2014   PLT 199 08/06/2014      Chemistry      Component Value Date/Time   NA 141 08/06/2014 0932   NA 134* 05/20/2014 1114   K 3.6 08/06/2014 0932   K 3.7 05/20/2014 1114   CL 99 05/20/2014 1114   CO2 25 08/06/2014 0932   CO2 24 05/20/2014 1114   BUN 14.0 08/06/2014 0932   BUN 15 05/20/2014 1114   CREATININE 0.6 08/06/2014 0932  CREATININE 0.35* 05/20/2014 1114      Component Value Date/Time   CALCIUM 9.0 08/06/2014 0932   CALCIUM 8.4 05/20/2014 1114   ALKPHOS 234* 08/06/2014 0932   ALKPHOS 69 05/20/2014 1114   AST 210* 08/06/2014 0932   AST 30 05/20/2014 1114   ALT 409* 08/06/2014 0932   ALT 28 05/20/2014 1114   BILITOT 0.42 08/06/2014 0932   BILITOT 0.5 05/20/2014 1114        STUDIES: Mr Jeri Cos Wo Contrast  07/23/14   CLINICAL DATA:  Breast cancer with brain metastases. Status post radiation treatment. Whole brain radiation November 2015  EXAM: MRI HEAD WITHOUT AND WITH CONTRAST  TECHNIQUE: Multiplanar, multiecho pulse sequences of the brain and surrounding structures were obtained without and with intravenous contrast.  CONTRAST:  74mL MULTIHANCE GADOBENATE DIMEGLUMINE 529 MG/ML  IV SOLN  COMPARISON:  MRI brain 05/12/14 and 02/26/2014  FINDINGS: Multiple enhancing lesions are again noted. A 9.0 x 9.5 mm lesion in the posterior right cerebellum is slightly increased in size compared to the prior study. This lesion had decreased in size previously.  A lesion in the posterior right temporal lobe is stable compared to the prior study. The lesion deep to the angular gyrus in the right parietal lobe is stable in size. There is a new punctate lesion in the posterior right parietal lobe which is new on image 37 of series 11. A lesion in the posterior frontal lobe on image 37 is stable to slightly decreased in size. A higher punctate right frontal lesion on image number 43 is stable. A punctate high a left frontal lobe lesion on image 49 is stable. No other new lesions are evident.  There is still some restricted diffusion associated with the posterior right frontal and parietal lesions on image 37. There is restricted diffusion associated with the new parietal lesion on image 37.  Diffuse periventricular T2 changes continue to progress within the white matter. Midline shift is stable at 2 mm right to left.  Flow is present in the major intracranial arteries. The globes and orbits are intact. The paranasal sinuses are clear. There is some fluid in left mastoid air cells without obstruction.  IMPRESSION: 1. Slight increase in size of the right cerebellar lesion which had previously decreased significantly. This is nonspecific. Recommend continued attention to this area. 2. Multiple other lesions are stable slightly decreased in size. 3. There is a new punctate lesion posteriorly in the right parietal lobe on image 37 of series 11. 4. Progressive diffuse white matter change. This is likely still related to post radiation changes. 5. Chronic left mastoid effusion. No obstructing nasopharyngeal lesion is evident.   Electronically Signed   By: San Morelle M.D.   On: July 23, 2014 11:11   Nm  Cardiac Muga Rest  07/30/2014   CLINICAL DATA:  Systolic chest heart failure. Evaluate cardiac function prior to chemotherapy.  EXAM: NUCLEAR MEDICINE CARDIAC BLOOD POOL IMAGING (MUGA)  TECHNIQUE: Cardiac multi-gated acquisition was performed at rest following intravenous injection of Tc-24m labeled red blood cells.  RADIOPHARMACEUTICALS:  Twenty-four mCiTc-5m in-vitro labeled red blood cells.  COMPARISON:  None.  FINDINGS: No focal wall motion abnormality of the left ventricle.  Calculated left ventricular ejection fraction equals 63%  IMPRESSION: Left ventricular ejection fraction equals 63%   Electronically Signed   By: Suzy Bouchard M.D.   On: 07/30/2014 13:31   CLINICAL DATA: Systolic chest heart failure. Evaluate cardiac function prior to chemotherapy.  EXAM: NUCLEAR MEDICINE CARDIAC BLOOD  POOL IMAGING (MUGA)  TECHNIQUE: Cardiac multi-gated acquisition was performed at rest following intravenous injection of Tc-22m labeled red blood cells.  RADIOPHARMACEUTICALS: Twenty-four mCiTc-32m in-vitro labeled red blood cells.  COMPARISON: None.  FINDINGS: No focal wall motion abnormality of the left ventricle.  Calculated left ventricular ejection fraction equals 63%  IMPRESSION: Left ventricular ejection fraction equals 63%   ASSESSMENT: 64 y.o. Carrie Berry, [place] woman status post right breast upper outer quadrant and right axillary lymph node biopsy 12/23/2012 for a clinical T3 N1 M1, stage IV invasive ductal carcinoma, grade 3, triple negative, with an MIB-1 of 86%.  (1) right liver lobe biopsy 01/21/2013 confirms metastatic adenocarcinoma; scans show involvement of the liver, lungs, and likely bone.  (2) enrolled in [facility] study [id number] (docetaxel + oral gamma secretase inibitor [id number]), received one cycle starting 02/04/2013  but withdrew because of poor tolerance despite treatment interruptions and decreased dosing of the oral component  (3)  chest CT in early November  2014 was compared with studies in August 2014 and showed interval progression of metastatic breast cancer, with an enlarging primary right breast mass, enlarging right axillary lymph nodes, enlarging pulmonary nodules and new/enlarging hepatic metastases.  (4) Abraxane started 03/19/2013, given day 1 and day 8 of each 21 day cycle, stopped with the day 1 cycle 3 dose (04/29/2013) because of local progression of disease  (5) cyclophosphamide and doxorubicin started 05/20/2013, given in dose dense fashion  with Neulasta support on day 2. Completed 4 cycles 07/03/2013, with the final cycle dose reduced 15% because of an episode of febrile neutropenia after cycle 3. Restaging studies 07/15/2013 showed partial response.  (6) started capecitabine April 2015, 1.5 g by mouth twice a day, 7 days on 7 days off--discontinued October 2015 with progression  (7) s/p Right simple mastectomy 02/17/2014 to optimize local control, the pathology ([id number]) showing an invasive ductal carcinoma measuring 4.8 cm, grade 3, with close but negative margins and evidence of treatment response   (a) nodule on right mastectomy scar noted 04/03/2014, removed 04/14/2014  (b) new nodule noted on R chest scar 05/19/2014  (c) radiation to R chest wall completed 07/22/2014  (8) multiple brain metastases noted on brain MRI 02/26/2014--s/p whole brain irradiation completed 03/17/2014  (a) repeat brain MRI 05/12/2014 shows favorable response  (b) repeat brain MRI 07/15/2014 shows questionable growth/ stable disease  (9) protein-calorie malnutrition  (10) high fall risk  (11) advanced directives: not in place; husband is healthcare power of attorney  (12) unexplained paroxysmal knee pain: possible ruptured Baker's cyst, Left, 05/01/2014  (13) doxil started 05/28/2014, repeated every 28 days. Cycle 3 delayed 2 weeks because of decrease in ejection fraction. To be restarted on 08/06/14.    PLAN: The labs were reviewed in  detail and unfortunately, her AST and ALT have reached critical levels, and her alkaline phosphatase has almost doubled. Her bilirubin is normal at 0.42. I consulted with Dr. Hilario Quarry, and we agree that treatment should be held once again, due to these elevations. It is possible that her disease has progressed within the liver, causing this rise. I have placed orders for a liver MRI to be performed within the next week.   For her lower back spasms I have started her on 5mg  flexeril TID PRN.   Carrie Berry will return in the next 2 weeks to discuss the results of her liver MRI. If she is able to have this performed early next week, I will gladly see her within the remaining time later in  the week. If not, she is already scheduled for follow up on 4/7. Carrie Berry understands and agrees with this plan. She knows the goal of treatment in her case is control. She has been encouraged to call with any issues that might arise before her next visit her.  Laurie Panda, NP  08/06/2014 12:27 PM                                                                                                                                                                                                                                                                                                                                                                                                                                                                                                                                                                                                                                                                                                                                                                                                                                                                                                                                                                                                                                                                                                                                                                                                                                                                                                                                                                                                                                                                                                                                                                                                                                                                                                                                                                    `

## 2014-08-06 NOTE — Patient Instructions (Signed)

## 2014-08-14 ENCOUNTER — Ambulatory Visit (HOSPITAL_COMMUNITY)
Admission: RE | Admit: 2014-08-14 | Discharge: 2014-08-14 | Disposition: A | Discharge status: Home / Self Care | Payer: BC Managed Care – PPO | Source: Ambulatory Visit | Attending: Nurse Practitioner | Admitting: Nurse Practitioner

## 2014-08-14 DIAGNOSIS — C50919 Malignant neoplasm of unspecified site of unspecified female breast: Secondary | ICD-10-CM | POA: Insufficient documentation

## 2014-08-14 DIAGNOSIS — C787 Secondary malignant neoplasm of liver and intrahepatic bile duct: Secondary | ICD-10-CM | POA: Diagnosis not present

## 2014-08-14 MED ORDER — GADOBENATE DIMEGLUMINE 529 MG/ML IV SOLN
15.0000 mL | Freq: Once | INTRAVENOUS | Status: AC | PRN
  Administered 2014-08-14: 11 mL via INTRAVENOUS

## 2014-08-17 ENCOUNTER — Other Ambulatory Visit: Payer: Self-pay | Admitting: Nurse Practitioner

## 2014-08-20 ENCOUNTER — Ambulatory Visit (HOSPITAL_BASED_OUTPATIENT_CLINIC_OR_DEPARTMENT_OTHER): Payer: BC Managed Care – PPO

## 2014-08-20 ENCOUNTER — Telehealth: Payer: Self-pay | Admitting: *Deleted

## 2014-08-20 ENCOUNTER — Other Ambulatory Visit (HOSPITAL_BASED_OUTPATIENT_CLINIC_OR_DEPARTMENT_OTHER): Payer: BC Managed Care – PPO

## 2014-08-20 ENCOUNTER — Ambulatory Visit (HOSPITAL_BASED_OUTPATIENT_CLINIC_OR_DEPARTMENT_OTHER): Payer: BC Managed Care – PPO | Admitting: Nurse Practitioner

## 2014-08-20 ENCOUNTER — Other Ambulatory Visit: Payer: BC Managed Care – PPO

## 2014-08-20 ENCOUNTER — Other Ambulatory Visit: Payer: Self-pay | Admitting: *Deleted

## 2014-08-20 ENCOUNTER — Ambulatory Visit: Payer: BC Managed Care – PPO

## 2014-08-20 VITALS — BP 111/74 | HR 86 | Temp 98.2°F | Resp 18 | Ht 63.0 in | Wt 139.7 lb

## 2014-08-20 DIAGNOSIS — C7800 Secondary malignant neoplasm of unspecified lung: Secondary | ICD-10-CM

## 2014-08-20 DIAGNOSIS — E46 Unspecified protein-calorie malnutrition: Secondary | ICD-10-CM

## 2014-08-20 DIAGNOSIS — D0590 Unspecified type of carcinoma in situ of unspecified breast: Secondary | ICD-10-CM

## 2014-08-20 DIAGNOSIS — C50411 Malignant neoplasm of upper-outer quadrant of right female breast: Secondary | ICD-10-CM

## 2014-08-20 DIAGNOSIS — C787 Secondary malignant neoplasm of liver and intrahepatic bile duct: Secondary | ICD-10-CM | POA: Diagnosis not present

## 2014-08-20 DIAGNOSIS — C7931 Secondary malignant neoplasm of brain: Secondary | ICD-10-CM | POA: Diagnosis not present

## 2014-08-20 DIAGNOSIS — C50911 Malignant neoplasm of unspecified site of right female breast: Secondary | ICD-10-CM

## 2014-08-20 DIAGNOSIS — K5900 Constipation, unspecified: Secondary | ICD-10-CM

## 2014-08-20 DIAGNOSIS — C773 Secondary and unspecified malignant neoplasm of axilla and upper limb lymph nodes: Secondary | ICD-10-CM | POA: Diagnosis not present

## 2014-08-20 DIAGNOSIS — Z5111 Encounter for antineoplastic chemotherapy: Secondary | ICD-10-CM

## 2014-08-20 DIAGNOSIS — R627 Adult failure to thrive: Secondary | ICD-10-CM

## 2014-08-20 DIAGNOSIS — C50919 Malignant neoplasm of unspecified site of unspecified female breast: Secondary | ICD-10-CM

## 2014-08-20 DIAGNOSIS — C7801 Secondary malignant neoplasm of right lung: Secondary | ICD-10-CM

## 2014-08-20 DIAGNOSIS — Z95828 Presence of other vascular implants and grafts: Secondary | ICD-10-CM

## 2014-08-20 LAB — CBC WITH DIFFERENTIAL/PLATELET
BASO%: 0.3 % (ref 0.0–2.0)
Basophils Absolute: 0 10*3/uL (ref 0.0–0.1)
EOS%: 0.8 % (ref 0.0–7.0)
Eosinophils Absolute: 0.1 10*3/uL (ref 0.0–0.5)
HCT: 39 % (ref 34.8–46.6)
HGB: 12.5 g/dL (ref 11.6–15.9)
LYMPH%: 10.9 % — AB (ref 14.0–49.7)
MCH: 33 pg (ref 25.1–34.0)
MCHC: 32.1 g/dL (ref 31.5–36.0)
MCV: 102.9 fL — AB (ref 79.5–101.0)
MONO#: 1.1 10*3/uL — ABNORMAL HIGH (ref 0.1–0.9)
MONO%: 8.8 % (ref 0.0–14.0)
NEUT#: 10.1 10*3/uL — ABNORMAL HIGH (ref 1.5–6.5)
NEUT%: 79.2 % — ABNORMAL HIGH (ref 38.4–76.8)
PLATELETS: 235 10*3/uL (ref 145–400)
RBC: 3.79 10*6/uL (ref 3.70–5.45)
RDW: 15.7 % — ABNORMAL HIGH (ref 11.2–14.5)
WBC: 12.7 10*3/uL — ABNORMAL HIGH (ref 3.9–10.3)
lymph#: 1.4 10*3/uL (ref 0.9–3.3)

## 2014-08-20 LAB — COMPREHENSIVE METABOLIC PANEL (CC13)
ALT: 113 U/L — ABNORMAL HIGH (ref 0–55)
ANION GAP: 12 meq/L — AB (ref 3–11)
AST: 63 U/L — ABNORMAL HIGH (ref 5–34)
Albumin: 3 g/dL — ABNORMAL LOW (ref 3.5–5.0)
Alkaline Phosphatase: 329 U/L — ABNORMAL HIGH (ref 40–150)
BUN: 13.8 mg/dL (ref 7.0–26.0)
CO2: 26 meq/L (ref 22–29)
CREATININE: 0.6 mg/dL (ref 0.6–1.1)
Calcium: 8.8 mg/dL (ref 8.4–10.4)
Chloride: 104 mEq/L (ref 98–109)
EGFR: >90 mL/min/{1.73_m2} (ref >90)
Glucose: 92 mg/dl (ref 70–140)
POTASSIUM: 3.5 meq/L (ref 3.5–5.1)
Sodium: 141 mEq/L (ref 136–145)
Total Bilirubin: 0.42 mg/dL (ref 0.20–1.20)
Total Protein: 6.3 g/dL — ABNORMAL LOW (ref 6.4–8.3)

## 2014-08-20 MED ORDER — SODIUM CHLORIDE 0.9 % IV SOLN
Freq: Once | INTRAVENOUS | Status: AC
  Administered 2014-08-20: 12:00:00 via INTRAVENOUS

## 2014-08-20 MED ORDER — MIRTAZAPINE 30 MG PO TABS: 15.0000 mg | ORAL_TABLET | Freq: Every day | ORAL | Status: DC

## 2014-08-20 MED ORDER — SODIUM CHLORIDE 0.9 % IJ SOLN
10.0000 mL | INTRAMUSCULAR | Status: DC | PRN
  Administered 2014-08-20: 10 mL via INTRAVENOUS
  Filled 2014-08-20: qty 10

## 2014-08-20 MED ORDER — SODIUM CHLORIDE 0.9 % IV SOLN
Freq: Once | INTRAVENOUS | Status: AC
  Administered 2014-08-20: 12:00:00 via INTRAVENOUS
  Filled 2014-08-20: qty 4

## 2014-08-20 MED ORDER — SODIUM CHLORIDE 0.9 % IJ SOLN
10.0000 mL | INTRAMUSCULAR | Status: DC | PRN
  Administered 2014-08-20: 10 mL
  Filled 2014-08-20: qty 10

## 2014-08-20 MED ORDER — HEPARIN SOD (PORK) LOCK FLUSH 100 UNIT/ML IV SOLN
500.0000 [IU] | Freq: Once | INTRAVENOUS | Status: AC | PRN
  Administered 2014-08-20: 500 [IU]
  Filled 2014-08-20: qty 5

## 2014-08-20 MED ORDER — DOXORUBICIN HCL LIPOSOMAL CHEMO INJECTION 2 MG/ML
43.0000 mg/m2 | Freq: Once | INTRAVENOUS | Status: AC
  Administered 2014-08-20: 70 mg via INTRAVENOUS
  Filled 2014-08-20: qty 35

## 2014-08-20 MED ORDER — METHYLPHENIDATE HCL 5 MG PO TABS: 5.0000 mg | ORAL_TABLET | Freq: Two times a day (BID) | ORAL | Status: DC

## 2014-08-20 NOTE — Patient Instructions (Signed)
[facility] Discharge Instructions for Patients Receiving Chemotherapy  Today you received the following chemotherapy agents: Doxil  To help prevent nausea and vomiting after your treatment, we encourage you to take your nausea medication as directed.  If you develop nausea and vomiting that is not controlled by your nausea medication, call the clinic.   BELOW ARE SYMPTOMS THAT SHOULD BE REPORTED IMMEDIATELY:  *FEVER GREATER THAN 100.5 F  *CHILLS WITH OR WITHOUT FEVER  NAUSEA AND VOMITING THAT IS NOT CONTROLLED WITH YOUR NAUSEA MEDICATION  *UNUSUAL SHORTNESS OF BREATH  *UNUSUAL BRUISING OR BLEEDING  TENDERNESS IN MOUTH AND THROAT WITH OR WITHOUT PRESENCE OF ULCERS  *URINARY PROBLEMS  *BOWEL PROBLEMS  UNUSUAL RASH Items with * indicate a potential emergency and should be followed up as soon as possible.  Feel free to call the clinic you have any questions or concerns. The clinic phone number is [phone].  Please show the CHEMO ALERT CARD at check-in to the Emergency Department and triage nurse.   

## 2014-08-20 NOTE — Telephone Encounter (Signed)
Per staff message and POF I have scheduled appts. Advised scheduler of appts. JMW  

## 2014-08-20 NOTE — Patient Instructions (Signed)

## 2014-08-20 NOTE — Progress Notes (Signed)
. Patient ID: Carrie Berry, female   DOB: August 24, 1950, 64 y.o.   MRN: [id number] ID: Carrie Berry OB: 1951/02/09  MR#: [id number]  CSN#:[id number]  PCP: Carrie Hams, MD GYN:   SU: Carrie Berry OTHER MD: Carrie Berry, Carrie Berry, Carrie Berry, Carrie Berry  CHIEF COMPLAINT:  Metastatic Breast Cancer CURRENT TREATMENT: Doxil  BREAST CANCER HISTORY: From the originally intake note:  Carrie Berry noted a mass in her right breast December of 2013, but did not think much of it. It did grow some lower the summer, but she was keeping her grandchild at that time and was too busy so she did not bring it to Carrie Berry attention until August. He set her up for bilateral mammography and ultrasonography at [facility] 12/18/2012 and this measured, on the right, a large irregular lobulated mass in the upper outer quadrant which by ultrasound measured 5.7 cm. The right axilla showed some lymph nodes with thickened cortices. In the left breast there was a focal asymmetry at the depth, lateral to the nipple, and by ultrasound there was a 9 mm ill-defined hypoechoic lesion in this location. Biopsy of the left breast lesion showed only fibrocystic changes.  Biopsy of the right breast mass and right axillary adenopathy ([id number]) on 12/23/2012 showed both to be involved by invasive ductal carcinoma, grade 2, triple negative, with an MIB-1 of 86%.  MRI of the breast 12/27/2012 at [facility] imaging showed in the right breast a mass abutting the pectoralis muscle without enhancement of the muscle measuring 4.8 cm. The satellite nodule measuring approximately 3 mm was also noted superior to the mass and several abnormal and enlarged right axillary lymph nodes were noted, one of which appeared to be necrotic. The largest node measured 2.0 cm. Unfortunately, numerous bilateral pulmonary nodules were also noted.  The patient's subsequent history is as detailed below   INTERVAL HISTORY: Carrie Berry returns today  for follow-up of her metastatic breast cancer, accompanied by her husband Carrie Berry. Today is day 1 cycle 3 of liposomal doxorubicin, which she receives every 28 days. She is tolerating the treatment with no side effects that she is aware of and in particular she has had no mucositis, no palmar plantar erythrodysesthesia, and no significant problems with her counts.  REVIEW OF SYSTEMS Carrie Berry just returned from a week at the beach with her daughter and grandson. She enjoyed sitting in front of a baby window and watching the ocean in the birds. She was pleased that she could do that. Carrie Berry is not exercising and spending all his time with her. She is trying to get him out of the house more. She feels she can be more independent. She had a headache involving the right parietal area on a couple of occasions. This was intense but very brief and has not recurred in the last week to 10 days. She denies dizziness, visual changes, nausea or vomiting. She denies chest pain, pressure, shortness of breath, cough or pleurisy. There's been no change in bowel or bladder habits. She is concerned that she is now gaining much weight. A detailed review of systems was otherwise stable  PAST MEDICAL HISTORY: Past Medical History  Diagnosis Date  . Recurrent sinus infections   . Arthritis   . PONV (postoperative nausea and vomiting)   . GERD (gastroesophageal reflux disease)   . 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 Anxiety     no problems at present  . rt breast ca dx'd 12/2011  breast  . Breast cancer   . 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 Tubal ligation    . Tubal ligation  1985  . Portacath placement N/A 01/10/2013    Procedure: ULTRASOUND GUIDED PORT-A-CATH INSERTION WITH FLUOROSCOPY;  Surgeon: Carrie Hollingshead, MD;  Location: [place];  Service: General;  Laterality: N/A;  .  Esophagogastroduodenoscopy N/A 08/19/2013    Procedure: ESOPHAGOGASTRODUODENOSCOPY (EGD);  Surgeon: Carrie Fair, MD;  Location: [facility];  Service: Endoscopy;  Laterality: N/A;  . Total mastectomy Right 02/17/2014    Procedure: PALLIATIVE  RIGHT MASTECTOMY;  Surgeon: Carrie Confer, MD;  Location: [facility];  Service: General;  Laterality: Right;    FAMILY HISTORY Family History  Problem Relation Age of Onset  . Bladder Cancer Father   . Colon cancer Maternal Grandmother    the patient's parents are living, both in their 75s. The patient's father was diagnosed with bladder cancer the age of 39. The patient's mother's mother had some type of gastrointestinal cancer. The patient had one brother, no sisters. There is no history of breast or ovarian cancer in the family other than a cousin on the father's side who was diagnosed with breast cancer apparently before the age of 42.  GYNECOLOGIC HISTORY:   (Reviewed 10/13/2013) Menarche age 41, first live birth age 40, the patient is Carrie Berry P2. She went through menopause in 2008. She did not take hormone replacement. She took birth control for approximately 22 years remotely.  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 She's currently on disability. Her husband Carrie Berry"), is vice Development worker, international aid for [employer]. The patient's daughter Carrie Berry is a stay-at-home mom in [place], the patient's son Carrie Berry unfortunately died in an automobile accident in December of 2011. The patient has 2 grandchildren. She attends to a local [employer].    ADVANCED DIRECTIVES: Not in place   HEALTH MAINTENANCE: (Updated 10/13/2013) History  Substance Use Topics  . Smoking status: Never Smoker   . Smokeless tobacco: Never Used  . Alcohol Use: No     Colonoscopy: 2005  PAP: Not on file  Bone density: Not on file  Lipid  panel:  Not on file/Dr. Polite   Allergies  Allergen Reactions  . Ondansetron Hcl Other (See Comments)    HEADACHE, patient states she still takes   . Codeine Nausea And Vomiting    "violently ill"  . Penicillins Rash    Childhood reaction -    Current Outpatient Prescriptions  Medication Sig Dispense Refill  . acyclovir (ZOVIRAX) 400 MG tablet Take 1 tablet (400 mg total) by mouth 2 (two) times daily. 60 tablet 6  . cetirizine (ZYRTEC) 10 MG tablet Take 10 mg by mouth daily.    . Cholecalciferol (VITAMIN D PO) Take 800 Units by mouth daily.    . cyclobenzaprine (FLEXERIL) 5 MG tablet Take 1 tablet (5 mg total) by mouth 3 (three) times daily as needed for muscle spasms. 30 tablet 0  . diphenoxylate-atropine (LOMOTIL) 2.5-0.025 MG per tablet Take 1 tablet by mouth 4 (four) times daily as needed for diarrhea or loose stools. (Patient not taking: Reported on 07/30/2014) 30 tablet 0  . docusate sodium 100 MG CAPS Take 100 mg by mouth daily. (Patient taking differently: Take 200 mg by mouth daily. )  10 capsule 0  . fluconazole (DIFLUCAN) 100 MG tablet Take 100 mg by mouth daily.    Marland Kitchen gabapentin (NEURONTIN) 100 MG capsule Take 1 capsule (100 mg total) by mouth 3 (three) times daily. 90 capsule 2  . HYDROmorphone (DILAUDID) 2 MG tablet Take 0.5 tablets (1 mg total) by mouth every 4 (four) hours as needed. (Patient not taking: Reported on 07/30/2014) 10 tablet 0  . lidocaine-prilocaine (EMLA) cream Apply to affected area once 30 g 3  . methylphenidate (RITALIN) 5 MG tablet Take 1 tablet (5 mg total) by mouth 2 (two) times daily with breakfast and lunch. 60 tablet 0  . mirtazapine (REMERON) 30 MG tablet Take 1 tablet (30 mg total) by mouth at bedtime. 30 tablet 1  . Multiple Vitamin (MULTIVITAMIN WITH MINERALS) TABS tablet Take 1 tablet by mouth daily.    . Omega-3 Krill Oil 300 MG CAPS Take by mouth.    Marland Kitchen omeprazole (PRILOSEC) 20 MG capsule TAKE 1 CAPSULE (20 MG TOTAL) BY MOUTH 2 (TWO) TIMES DAILY  BEFORE A MEAL. 60 capsule 6  . ondansetron (ZOFRAN) 8 MG tablet Take 1 tablet (8 mg total) by mouth 2 (two) times daily. Start the day after chemo for 2 days. Then take as needed for nausea or vomiting. (Patient not taking: Reported on 07/30/2014) 30 tablet 1  . prochlorperazine (COMPAZINE) 10 MG tablet TAKE 1 TABLET (10 MG TOTAL) BY MOUTH EVERY 6 (SIX) HOURS AS NEEDED (NAUSEA OR VOMITING). (Patient not taking: Reported on 07/30/2014) 30 tablet 1  . promethazine (PHENERGAN) 25 MG tablet Take 1 tablet (25 mg total) by mouth every 6 (six) hours as needed for nausea or vomiting. (Patient not taking: Reported on 07/30/2014) 15 tablet 0  . simethicone (MYLICON) 80 MG chewable tablet Chew 1 tablet (80 mg total) by mouth every 6 (six) hours as needed for flatulence. 30 tablet 0  . traZODone (DESYREL) 50 MG tablet TAKE 1 TO 2 TABLETS BY MOUTH AT BEDTIME FOR SLEEP 60 tablet 0  . traZODone (DESYREL) 50 MG tablet TAKE 1 TO 2 TABLETS BY MOUTH AT BEDTIME FOR SLEEP 60 tablet 0   No current facility-administered medications for this visit.   Facility-Administered Medications Ordered in Other Visits  Medication Dose Route Frequency Provider Last Rate Last Dose  . sodium chloride 0.9 % injection 10 mL  10 mL Intravenous PRN Carrie Cruel, MD        OBJECTIVE: Middle-aged white woman in no acute distress Filed Vitals:   08/20/14 1044  BP: 111/74  Pulse: 86  Temp: 98.2 F (36.8 C)  Resp: 18     Body mass index is 24.75 kg/(m^2).    ECOG FS: 2 Filed Weights   08/20/14 1044  Weight: 139 lb 11.2 oz (63.368 kg)    Sclerae unicteric, pupils equal and reactive, EOMs intact  Oropharynx clear and moist-- no thrush or other lesions No cervical or supraclavicular adenopathy Lungs no rales or rhonchi Heart regular rate and rhythm Abd soft, distended, nontender, positive bowel sounds MSK no focal spinal tenderness, no upper extremity lymphedema Neuro: nonfocal, well oriented, appropriate affect Breasts:  Deferred   LAB RESULTS:   Lab Results  Component Value Date   WBC 10.0 08/06/2014   NEUTROABS 7.7* 08/06/2014   HGB 12.7 08/06/2014   HCT 39.2 08/06/2014   MCV 102.1* 08/06/2014   PLT 199 08/06/2014      Chemistry      Component Value Date/Time   NA 141 08/06/2014 0932  NA 134* 05/20/2014 1114   K 3.6 08/06/2014 0932   K 3.7 05/20/2014 1114   CL 99 05/20/2014 1114   CO2 25 08/06/2014 0932   CO2 24 05/20/2014 1114   BUN 14.0 08/06/2014 0932   BUN 15 05/20/2014 1114   CREATININE 0.6 08/06/2014 0932   CREATININE 0.35* 05/20/2014 1114      Component Value Date/Time   CALCIUM 9.0 08/06/2014 0932   CALCIUM 8.4 05/20/2014 1114   ALKPHOS 234* 08/06/2014 0932   ALKPHOS 69 05/20/2014 1114   AST 210* 08/06/2014 0932   AST 30 05/20/2014 1114   ALT 409* 08/06/2014 0932   ALT 28 05/20/2014 1114   BILITOT 0.42 08/06/2014 0932   BILITOT 0.5 05/20/2014 1114        STUDIES: Nm Cardiac Muga Rest  07/30/2014   CLINICAL DATA:  Systolic chest heart failure. Evaluate cardiac function prior to chemotherapy.  EXAM: NUCLEAR MEDICINE CARDIAC BLOOD POOL IMAGING (MUGA)  TECHNIQUE: Cardiac multi-gated acquisition was performed at rest following intravenous injection of Tc-70m labeled red blood cells.  RADIOPHARMACEUTICALS:  Twenty-four mCiTc-56m in-vitro labeled red blood cells.  COMPARISON:  None.  FINDINGS: No focal wall motion abnormality of the left ventricle.  Calculated left ventricular ejection fraction equals 63%  IMPRESSION: Left ventricular ejection fraction equals 63%   Electronically Signed   By: Suzy Bouchard M.D.   On: 07/30/2014 13:31   Mr Liver W Wo Contrast  08/14/2014   CLINICAL DATA:  64 year old female with metastatic breast cancer with elevated liver enzymes. An evaluate for liver metastases.  EXAM: MRI ABDOMEN WITHOUT AND WITH CONTRAST  TECHNIQUE: Multiplanar multisequence MR imaging of the abdomen was performed both before and after the administration of intravenous  contrast.  CONTRAST:  28mL MULTIHANCE GADOBENATE DIMEGLUMINE 529 MG/ML IV SOLN  COMPARISON:  MRI of the abdomen 06/03/2014.  FINDINGS: Lower chest: Poorly evaluated secondary to susceptibility artifact from gas in the lungs, however, there are at least 2 large nodules in the left lower lobe, measuring up to 1.6 cm in diameter (image 88 of series 1102), slightly larger than prior chest CT 05/21/2014. Status post right mastectomy.  Hepatobiliary: Multiple hepatic lesions are again noted, however, these generally appear decreased in size and number compared to the prior examination. The largest lesion seen on the prior study in segment 4A of the liver currently measures only 18 mm (image 34 of series 1102), previously 2.3 cm on 06/03/2014. Than new lesions seen on the prior examination in segment 8 of the liver has also decreased in size, currently measuring only 11 mm (image 40 of series 1102), previously 14 mm. Additionally, other lesions have resolved entirely, including a lesion that was previously noted in segment 4A of the liver adjacent to the largest lesion, which is no longer clearly identified (lesion seen on image 23 of series 1102 of prior examination from 06/03/2014). Several other lesions are also substantially smaller than the prior examination. No definite new hepatic lesions are noted. However, today's study does demonstrate increasing intrahepatic biliary ductal dilatation in the right lobe of the liver, which could be related to obstruction of the bile ducts at the level of the lesion in the central aspect of segment 8 (these image 40 of series 1102), despite its decreasing size. No extrahepatic biliary ductal dilatation. Common bile duct measures 3 mm in the porta hepatis. Small gallstones are noted dependently in the gallbladder. No current findings to suggest acute cholecystitis at this time. No ductal stones identified in the  common bile duct.  Pancreas: No pancreatic mass. No pancreatic ductal  dilatation. No pancreatic or peripancreatic inflammatory changes or fluid collections.  Spleen: Unremarkable.  Adrenals/Urinary Tract: Normal appearance of the kidneys and adrenal glands bilaterally.  Stomach/Bowel: The appearance of the stomach in visualize small bowel and colon are unremarkable.  Vascular/Lymphatic: No aneurysm identified in the visualized abdominal vasculature. No lymphadenopathy noted in the abdomen.  Other: No significant volume of ascites in the visualized abdomen.  Musculoskeletal: No aggressive osseous lesions noted in the visualized portions of the skeleton.  IMPRESSION: 1. Today's study demonstrates a significant positive response to therapy with decreased size and decreased number of numerous hepatic metastases, as discussed above. 2. There is increasing biliary ductal dilatation in the right lobe of the liver, which appears to be centralized around a regressing lesion in the central aspect of segment 8 of the liver, implying some degree of ductal stenosis/obstruction either related to the lesion itself, or perilesional edema/inflammatory changes. 3. Pulmonary nodules in the left lower lobe appear slightly larger than prior chest CT 05/21/2014, although direct comparison between MRI and CT is limited, particularly in the thorax. Correlation with repeat noncontrast chest CT is suggested in the near future if clinically appropriate to re-evaluate the pulmonary metastasis. 4. Cholelithiasis without evidence to suggest acute cholecystitis at this time.   Electronically Signed   By: Vinnie Langton M.D.   On: 08/14/2014 16:41   CLINICAL DATA: Systolic chest heart failure. Evaluate cardiac function prior to chemotherapy.  EXAM: NUCLEAR MEDICINE CARDIAC BLOOD POOL IMAGING (MUGA)  TECHNIQUE: Cardiac multi-gated acquisition was performed at rest following intravenous injection of Tc-49m labeled red blood cells.  RADIOPHARMACEUTICALS: Twenty-four mCiTc-20m in-vitro labeled  red blood cells.  COMPARISON: None.  FINDINGS: No focal wall motion abnormality of the left ventricle.  Calculated left ventricular ejection fraction equals 63%  IMPRESSION: Left ventricular ejection fraction equals 63%    ASSESSMENT: 64 y.o. Carrie Berry, [place] woman status post right breast upper outer quadrant and right axillary lymph node biopsy 12/23/2012 for a clinical T3 N1 M1, stage IV invasive ductal carcinoma, grade 3, triple negative, with an MIB-1 of 86%.  (1) right liver lobe biopsy 01/21/2013 confirms metastatic adenocarcinoma; scans show involvement of the liver, lungs, and likely bone.  (2) enrolled in [facility] study [id number] (docetaxel + oral gamma secretase inibitor [id number]), received one cycle starting 02/04/2013  but withdrew because of poor tolerance despite treatment interruptions and decreased dosing of the oral component  (3)  chest CT in early November 2014 was compared with studies in August 2014 and showed interval progression of metastatic breast cancer, with an enlarging primary right breast mass, enlarging right axillary lymph nodes, enlarging pulmonary nodules and new/enlarging hepatic metastases.  (4) Abraxane started 03/19/2013, given day 1 and day 8 of each 21 day cycle, stopped with the day 1 cycle 3 dose (04/29/2013) because of local progression of disease  (5) cyclophosphamide and doxorubicin started 05/20/2013, given in dose dense fashion  with Neulasta support on day 2. Completed 4 cycles 07/03/2013, with the final cycle dose reduced 15% because of an episode of febrile neutropenia after cycle 3. Restaging studies 07/15/2013 showed partial response.  (6) started capecitabine April 2015, 1.5 g by mouth twice a day, 7 days on 7 days off--discontinued October 2015 with progression  (7) s/p Right simple mastectomy 02/17/2014 to optimize local control, the pathology ([id number]) showing an invasive ductal carcinoma measuring 4.8 cm, grade 3, with close but  negative margins and evidence of  treatment response   (a) nodule on right mastectomy scar noted 04/03/2014, removed 04/14/2014  (b) new nodule noted on R chest scar 05/19/2014  (c) radiation to R chest wall completed 07/22/2014  (8) multiple brain metastases noted on brain MRI 02/26/2014--s/p whole brain irradiation completed 03/17/2014  (a) repeat brain MRI 05/12/2014 shows favorable response  (b) repeat brain MRI 07/15/2014 shows questionable growth/ stable disease  (9) protein-calorie malnutrition  (10) high fall risk  (11) advanced directives: not in place; husband is healthcare power of attorney  (12) unexplained paroxysmal knee pain: possible ruptured Baker's cyst, Left, 05/01/2014  (13) doxil started 05/28/2014, repeated every 28 days. Cycle 3 delayed 2 weeks because of decrease in ejection fraction, which proved spurious (MUGA results 63% EF). Doxil esumed 04//11/2014 with evidence of response   PLAN: I am delighted that Kynley got to go to the beach, spend time with her family, and watch the ocean in the birds. It's also good news that the Doxil seems to be working and her liver lesions are shrinking. Furthermore she is tolerating it without any obvious side effects  She is gaining a little bit more weight then we wanted so I am dropping the Remeron to 15 mg at bedtime.  Accordingly we are continuing therapy. There is some biliary obstruction on part of the liver which may be due to scar tissue or may be due to one of the tumors (although the tumor immediately adjacent to the area appears to be shrinking). It is not impossible that this may improve with further treatment.  She has had a couple of headaches involving the right parietal area, but they have not been persistent. She discussed possibly obtaining an MRI before her visit with Dr. Pablo Ledger next week but we decided to leave that decision up to Dr. Pablo Ledger.  Amayra will return every 4 weeks for treatment and a visit. She  will see me specifically June 29. Before that treatment she will have a repeat MRI of the liver  She has a good understanding of the overall plan. She agrees with it. She knows the goal of treatment in her case is control. She will call with any problems that may develop before her next visit here.      Carrie Cruel, MD   08/20/2014 10:48 AM                                                                                                                                                                                                                                                                                                                                                                                                                                                                                                                                                                                                                                                                                                                                                                                                                                                                                                                                                                                                                                                                                                                                                                                                                                                                                                                                                                                                                                                                                                                                                                                                                                                                                                                                                                    `

## 2014-08-20 NOTE — Progress Notes (Signed)
Ok to treat per [name] NP

## 2014-08-20 NOTE — Progress Notes (Signed)
Patient in for Port-A-Cath access and labs today. Patient's port accessed and flushed without any difficulty. No blood return noted with flush. Patient denied any pain or discomfort during or after flush. Patient's port left accessed and dressed with Opsite dressing for appointment in Infusion today. [name], NP and Rea [employer], LPN made aware of no blood return from port. Patient discharged from Flush Room and arrived for appointment with [name], NP at this time.

## 2014-08-24 ENCOUNTER — Telehealth: Payer: Self-pay | Admitting: *Deleted

## 2014-08-24 NOTE — Telephone Encounter (Signed)
Patient called asking if she is to keep the appointment tomorrow at 2:43m pm.  Noted CT is for restaging.  The April 2016 MRI is not a good correlation to monitor lungs.  This nurse called her instructing to keep the CT appointment for better comparison with CT done in January 2016 to restage.  Also informed to arrive at [facility] at 2:15 pm.

## 2014-08-25 ENCOUNTER — Encounter (HOSPITAL_COMMUNITY): Payer: Self-pay

## 2014-08-25 ENCOUNTER — Ambulatory Visit (HOSPITAL_COMMUNITY)
Admission: RE | Admit: 2014-08-25 | Discharge: 2014-08-25 | Disposition: A | Discharge status: Home / Self Care | Payer: BC Managed Care – PPO | Source: Ambulatory Visit | Attending: Oncology | Admitting: Oncology

## 2014-08-25 DIAGNOSIS — C50919 Malignant neoplasm of unspecified site of unspecified female breast: Secondary | ICD-10-CM | POA: Diagnosis not present

## 2014-08-25 MED ORDER — IOHEXOL 300 MG/ML  SOLN
80.0000 mL | Freq: Once | INTRAMUSCULAR | Status: AC | PRN
  Administered 2014-08-25: 80 mL via INTRAVENOUS

## 2014-08-26 ENCOUNTER — Inpatient Hospital Stay (HOSPITAL_COMMUNITY)
Admission: EM | Admit: 2014-08-26 | Discharge: 2014-08-28 | DRG: 300 | Disposition: A | Discharge status: Home / Self Care | Payer: BC Managed Care – PPO | Attending: Internal Medicine | Admitting: Internal Medicine

## 2014-08-26 DIAGNOSIS — M1990 Unspecified osteoarthritis, unspecified site: Secondary | ICD-10-CM | POA: Diagnosis present

## 2014-08-26 DIAGNOSIS — Z7952 Long term (current) use of systemic steroids: Secondary | ICD-10-CM

## 2014-08-26 DIAGNOSIS — K5900 Constipation, unspecified: Secondary | ICD-10-CM | POA: Diagnosis present

## 2014-08-26 DIAGNOSIS — I82409 Acute embolism and thrombosis of unspecified deep veins of unspecified lower extremity: Secondary | ICD-10-CM | POA: Diagnosis present

## 2014-08-26 DIAGNOSIS — I82419 Acute embolism and thrombosis of unspecified femoral vein: Secondary | ICD-10-CM | POA: Diagnosis not present

## 2014-08-26 DIAGNOSIS — K219 Gastro-esophageal reflux disease without esophagitis: Secondary | ICD-10-CM | POA: Diagnosis present

## 2014-08-26 DIAGNOSIS — I82429 Acute embolism and thrombosis of unspecified iliac vein: Secondary | ICD-10-CM | POA: Diagnosis present

## 2014-08-26 DIAGNOSIS — C50411 Malignant neoplasm of upper-outer quadrant of right female breast: Secondary | ICD-10-CM | POA: Diagnosis present

## 2014-08-26 DIAGNOSIS — F419 Anxiety disorder, unspecified: Secondary | ICD-10-CM | POA: Diagnosis present

## 2014-08-26 DIAGNOSIS — B370 Candidal stomatitis: Secondary | ICD-10-CM | POA: Diagnosis present

## 2014-08-26 DIAGNOSIS — R109 Unspecified abdominal pain: Secondary | ICD-10-CM | POA: Diagnosis not present

## 2014-08-26 DIAGNOSIS — Z853 Personal history of malignant neoplasm of breast: Secondary | ICD-10-CM

## 2014-08-26 DIAGNOSIS — E46 Unspecified protein-calorie malnutrition: Secondary | ICD-10-CM | POA: Diagnosis present

## 2014-08-26 DIAGNOSIS — B379 Candidiasis, unspecified: Secondary | ICD-10-CM | POA: Diagnosis present

## 2014-08-26 DIAGNOSIS — C7801 Secondary malignant neoplasm of right lung: Secondary | ICD-10-CM | POA: Diagnosis present

## 2014-08-26 DIAGNOSIS — C7800 Secondary malignant neoplasm of unspecified lung: Secondary | ICD-10-CM | POA: Diagnosis present

## 2014-08-26 DIAGNOSIS — Z880 Allergy status to penicillin: Secondary | ICD-10-CM

## 2014-08-26 DIAGNOSIS — R569 Unspecified convulsions: Secondary | ICD-10-CM | POA: Diagnosis present

## 2014-08-26 DIAGNOSIS — C7931 Secondary malignant neoplasm of brain: Secondary | ICD-10-CM | POA: Diagnosis present

## 2014-08-26 DIAGNOSIS — I82403 Acute embolism and thrombosis of unspecified deep veins of lower extremity, bilateral: Secondary | ICD-10-CM

## 2014-08-26 DIAGNOSIS — C7802 Secondary malignant neoplasm of left lung: Secondary | ICD-10-CM | POA: Diagnosis present

## 2014-08-26 DIAGNOSIS — Z923 Personal history of irradiation: Secondary | ICD-10-CM

## 2014-08-26 DIAGNOSIS — C787 Secondary malignant neoplasm of liver and intrahepatic bile duct: Secondary | ICD-10-CM | POA: Diagnosis present

## 2014-08-26 DIAGNOSIS — Z9011 Acquired absence of right breast and nipple: Secondary | ICD-10-CM | POA: Diagnosis present

## 2014-08-26 DIAGNOSIS — G40109 Localization-related (focal) (partial) symptomatic epilepsy and epileptic syndromes with simple partial seizures, not intractable, without status epilepticus: Secondary | ICD-10-CM

## 2014-08-26 DIAGNOSIS — Z885 Allergy status to narcotic agent status: Secondary | ICD-10-CM

## 2014-08-26 DIAGNOSIS — R52 Pain, unspecified: Secondary | ICD-10-CM

## 2014-08-26 DIAGNOSIS — C50911 Malignant neoplasm of unspecified site of right female breast: Secondary | ICD-10-CM | POA: Diagnosis present

## 2014-08-26 DIAGNOSIS — Z7401 Bed confinement status: Secondary | ICD-10-CM

## 2014-08-26 DIAGNOSIS — F840 Autistic disorder: Secondary | ICD-10-CM | POA: Diagnosis present

## 2014-08-26 DIAGNOSIS — R1010 Upper abdominal pain, unspecified: Secondary | ICD-10-CM

## 2014-08-27 ENCOUNTER — Encounter (HOSPITAL_COMMUNITY): Payer: Self-pay

## 2014-08-27 ENCOUNTER — Emergency Department (HOSPITAL_COMMUNITY): Payer: BC Managed Care – PPO

## 2014-08-27 ENCOUNTER — Other Ambulatory Visit: Payer: BC Managed Care – PPO

## 2014-08-27 ENCOUNTER — Ambulatory Visit: Payer: BC Managed Care – PPO | Admitting: Oncology

## 2014-08-27 ENCOUNTER — Telehealth: Payer: Self-pay | Admitting: Oncology

## 2014-08-27 DIAGNOSIS — Z7952 Long term (current) use of systemic steroids: Secondary | ICD-10-CM | POA: Diagnosis not present

## 2014-08-27 DIAGNOSIS — C787 Secondary malignant neoplasm of liver and intrahepatic bile duct: Secondary | ICD-10-CM | POA: Diagnosis present

## 2014-08-27 DIAGNOSIS — I82403 Acute embolism and thrombosis of unspecified deep veins of lower extremity, bilateral: Secondary | ICD-10-CM | POA: Diagnosis not present

## 2014-08-27 DIAGNOSIS — G40109 Localization-related (focal) (partial) symptomatic epilepsy and epileptic syndromes with simple partial seizures, not intractable, without status epilepticus: Secondary | ICD-10-CM

## 2014-08-27 DIAGNOSIS — C773 Secondary and unspecified malignant neoplasm of axilla and upper limb lymph nodes: Secondary | ICD-10-CM

## 2014-08-27 DIAGNOSIS — K831 Obstruction of bile duct: Secondary | ICD-10-CM

## 2014-08-27 DIAGNOSIS — K219 Gastro-esophageal reflux disease without esophagitis: Secondary | ICD-10-CM

## 2014-08-27 DIAGNOSIS — R569 Unspecified convulsions: Secondary | ICD-10-CM | POA: Diagnosis present

## 2014-08-27 DIAGNOSIS — B370 Candidal stomatitis: Secondary | ICD-10-CM

## 2014-08-27 DIAGNOSIS — C7802 Secondary malignant neoplasm of left lung: Secondary | ICD-10-CM | POA: Diagnosis present

## 2014-08-27 DIAGNOSIS — C50411 Malignant neoplasm of upper-outer quadrant of right female breast: Secondary | ICD-10-CM

## 2014-08-27 DIAGNOSIS — K5909 Other constipation: Secondary | ICD-10-CM

## 2014-08-27 DIAGNOSIS — Z885 Allergy status to narcotic agent status: Secondary | ICD-10-CM | POA: Diagnosis not present

## 2014-08-27 DIAGNOSIS — Z7401 Bed confinement status: Secondary | ICD-10-CM | POA: Diagnosis not present

## 2014-08-27 DIAGNOSIS — C50911 Malignant neoplasm of unspecified site of right female breast: Secondary | ICD-10-CM | POA: Diagnosis present

## 2014-08-27 DIAGNOSIS — F840 Autistic disorder: Secondary | ICD-10-CM | POA: Diagnosis present

## 2014-08-27 DIAGNOSIS — C7801 Secondary malignant neoplasm of right lung: Secondary | ICD-10-CM | POA: Diagnosis present

## 2014-08-27 DIAGNOSIS — C801 Malignant (primary) neoplasm, unspecified: Secondary | ICD-10-CM

## 2014-08-27 DIAGNOSIS — C7940 Secondary malignant neoplasm of unspecified part of nervous system: Secondary | ICD-10-CM

## 2014-08-27 DIAGNOSIS — K5900 Constipation, unspecified: Secondary | ICD-10-CM

## 2014-08-27 DIAGNOSIS — Z9011 Acquired absence of right breast and nipple: Secondary | ICD-10-CM | POA: Diagnosis present

## 2014-08-27 DIAGNOSIS — I82429 Acute embolism and thrombosis of unspecified iliac vein: Secondary | ICD-10-CM | POA: Diagnosis present

## 2014-08-27 DIAGNOSIS — C7800 Secondary malignant neoplasm of unspecified lung: Secondary | ICD-10-CM | POA: Diagnosis not present

## 2014-08-27 DIAGNOSIS — R109 Unspecified abdominal pain: Secondary | ICD-10-CM

## 2014-08-27 DIAGNOSIS — I82409 Acute embolism and thrombosis of unspecified deep veins of unspecified lower extremity: Secondary | ICD-10-CM | POA: Diagnosis present

## 2014-08-27 DIAGNOSIS — F419 Anxiety disorder, unspecified: Secondary | ICD-10-CM | POA: Diagnosis present

## 2014-08-27 DIAGNOSIS — B379 Candidiasis, unspecified: Secondary | ICD-10-CM | POA: Diagnosis present

## 2014-08-27 DIAGNOSIS — C7931 Secondary malignant neoplasm of brain: Secondary | ICD-10-CM

## 2014-08-27 DIAGNOSIS — Z923 Personal history of irradiation: Secondary | ICD-10-CM | POA: Diagnosis not present

## 2014-08-27 DIAGNOSIS — E46 Unspecified protein-calorie malnutrition: Secondary | ICD-10-CM | POA: Diagnosis present

## 2014-08-27 DIAGNOSIS — Z880 Allergy status to penicillin: Secondary | ICD-10-CM | POA: Diagnosis not present

## 2014-08-27 DIAGNOSIS — M1990 Unspecified osteoarthritis, unspecified site: Secondary | ICD-10-CM | POA: Diagnosis present

## 2014-08-27 DIAGNOSIS — I739 Peripheral vascular disease, unspecified: Secondary | ICD-10-CM

## 2014-08-27 DIAGNOSIS — I82419 Acute embolism and thrombosis of unspecified femoral vein: Secondary | ICD-10-CM | POA: Diagnosis present

## 2014-08-27 DIAGNOSIS — Z853 Personal history of malignant neoplasm of breast: Secondary | ICD-10-CM | POA: Diagnosis not present

## 2014-08-27 LAB — CBC WITH DIFFERENTIAL/PLATELET
BASOS PCT: 0 % (ref 0–1)
Basophils Absolute: 0 10*3/uL (ref 0.0–0.1)
Basophils Absolute: 0 10*3/uL (ref 0.0–0.1)
Basophils Relative: 0 % (ref 0–1)
EOS ABS: 0 10*3/uL (ref 0.0–0.7)
EOS PCT: 0 % (ref 0–5)
Eosinophils Absolute: 0 10*3/uL (ref 0.0–0.7)
Eosinophils Relative: 0 % (ref 0–5)
HEMATOCRIT: 35.7 % — AB (ref 36.0–46.0)
HEMATOCRIT: 40.3 % (ref 36.0–46.0)
HEMOGLOBIN: 11.5 g/dL — AB (ref 12.0–15.0)
HEMOGLOBIN: 13 g/dL (ref 12.0–15.0)
LYMPHS ABS: 1 10*3/uL (ref 0.7–4.0)
LYMPHS PCT: 11 % — AB (ref 12–46)
LYMPHS PCT: 12 % (ref 12–46)
Lymphs Abs: 1 10*3/uL (ref 0.7–4.0)
MCH: 32.5 pg (ref 26.0–34.0)
MCH: 32.7 pg (ref 26.0–34.0)
MCHC: 32.2 g/dL (ref 30.0–36.0)
MCHC: 32.3 g/dL (ref 30.0–36.0)
MCV: 100.8 fL — AB (ref 78.0–100.0)
MCV: 101.3 fL — ABNORMAL HIGH (ref 78.0–100.0)
MONOS PCT: 3 % (ref 3–12)
Monocytes Absolute: 0.2 10*3/uL (ref 0.1–1.0)
Monocytes Absolute: 0.2 10*3/uL (ref 0.1–1.0)
Monocytes Relative: 2 % — ABNORMAL LOW (ref 3–12)
NEUTROS ABS: 7.6 10*3/uL (ref 1.7–7.7)
NEUTROS ABS: 8.1 10*3/uL — AB (ref 1.7–7.7)
NEUTROS PCT: 87 % — AB (ref 43–77)
Neutrophils Relative %: 85 % — ABNORMAL HIGH (ref 43–77)
Platelets: 192 10*3/uL (ref 150–400)
Platelets: 249 10*3/uL (ref 150–400)
RBC: 3.54 MIL/uL — ABNORMAL LOW (ref 3.87–5.11)
RBC: 3.98 MIL/uL (ref 3.87–5.11)
RDW: 14.9 % (ref 11.5–15.5)
RDW: 15 % (ref 11.5–15.5)
WBC: 8.9 10*3/uL (ref 4.0–10.5)
WBC: 9.4 10*3/uL (ref 4.0–10.5)

## 2014-08-27 LAB — COMPREHENSIVE METABOLIC PANEL
ALBUMIN: 2.8 g/dL — AB (ref 3.5–5.2)
ALBUMIN: 3.5 g/dL (ref 3.5–5.2)
ALK PHOS: 347 U/L — AB (ref 39–117)
ALK PHOS: 383 U/L — AB (ref 39–117)
ALT: 145 U/L — ABNORMAL HIGH (ref 0–35)
ALT: 326 U/L — ABNORMAL HIGH (ref 0–35)
AST: 147 U/L — ABNORMAL HIGH (ref 0–37)
AST: 408 U/L — ABNORMAL HIGH (ref 0–37)
Anion gap: 12 (ref 5–15)
Anion gap: 6 (ref 5–15)
BILIRUBIN TOTAL: 1.9 mg/dL — AB (ref 0.3–1.2)
BUN: 14 mg/dL (ref 6–23)
BUN: 20 mg/dL (ref 6–23)
CHLORIDE: 104 mmol/L (ref 96–112)
CO2: 25 mmol/L (ref 19–32)
CO2: 28 mmol/L (ref 19–32)
CREATININE: 0.42 mg/dL — AB (ref 0.50–1.10)
Calcium: 8.6 mg/dL (ref 8.4–10.5)
Calcium: 9.1 mg/dL (ref 8.4–10.5)
Chloride: 103 mmol/L (ref 96–112)
Creatinine, Ser: 0.35 mg/dL — ABNORMAL LOW (ref 0.50–1.10)
GFR calc Af Amer: >90 mL/min (ref >90)
GFR calc Af Amer: >90 mL/min (ref >90)
GFR calc non Af Amer: >90 mL/min (ref >90)
GFR calc non Af Amer: >90 mL/min (ref >90)
GLUCOSE: 133 mg/dL — AB (ref 70–99)
GLUCOSE: 85 mg/dL (ref 70–99)
POTASSIUM: 3.9 mmol/L (ref 3.5–5.1)
Potassium: 3.8 mmol/L (ref 3.5–5.1)
SODIUM: 138 mmol/L (ref 135–145)
Sodium: 140 mmol/L (ref 135–145)
Total Bilirubin: 0.9 mg/dL (ref 0.3–1.2)
Total Protein: 6 g/dL (ref 6.0–8.3)
Total Protein: 7 g/dL (ref 6.0–8.3)

## 2014-08-27 LAB — URINALYSIS, ROUTINE W REFLEX MICROSCOPIC
Bilirubin Urine: NEGATIVE
GLUCOSE, UA: NEGATIVE mg/dL
HGB URINE DIPSTICK: NEGATIVE
Ketones, ur: NEGATIVE mg/dL
LEUKOCYTES UA: NEGATIVE
Nitrite: NEGATIVE
PH: 6.5 (ref 5.0–8.0)
Protein, ur: NEGATIVE mg/dL
Specific Gravity, Urine: 1.023 (ref 1.005–1.030)
UROBILINOGEN UA: 0.2 mg/dL (ref 0.0–1.0)

## 2014-08-27 LAB — TROPONIN I: Troponin I: <0.03 ng/mL (ref <0.031)

## 2014-08-27 LAB — I-STAT TROPONIN, ED: TROPONIN I, POC: 0 ng/mL (ref 0.00–0.08)

## 2014-08-27 LAB — PROTIME-INR
INR: 0.92 (ref 0.00–1.49)
Prothrombin Time: 12.4 seconds (ref 11.6–15.2)

## 2014-08-27 LAB — LIPASE, BLOOD: Lipase: 44 U/L (ref 11–59)

## 2014-08-27 MED ORDER — MAGNESIUM HYDROXIDE 400 MG/5ML PO SUSP: 15.0000 mL | Freq: Every day | ORAL | Status: DC | PRN

## 2014-08-27 MED ORDER — IOHEXOL 300 MG/ML  SOLN
50.0000 mL | Freq: Once | INTRAMUSCULAR | Status: AC | PRN
  Administered 2014-08-27: 50 mL via ORAL

## 2014-08-27 MED ORDER — ONDANSETRON HCL 4 MG/2ML IJ SOLN: 4.0000 mg | Freq: Four times a day (QID) | INTRAMUSCULAR | Status: DC | PRN

## 2014-08-27 MED ORDER — HEPARIN (PORCINE) IN NACL 100-0.45 UNIT/ML-% IJ SOLN
750.0000 [IU]/h | INTRAMUSCULAR | Status: DC
  Administered 2014-08-27: 750 [IU]/h via INTRAVENOUS
  Filled 2014-08-27: qty 250

## 2014-08-27 MED ORDER — LEVETIRACETAM 500 MG PO TABS
1000.0000 mg | ORAL_TABLET | Freq: Two times a day (BID) | ORAL | Status: DC
  Administered 2014-08-27 – 2014-08-28 (×3): 1000 mg via ORAL
  Filled 2014-08-27 (×3): qty 2

## 2014-08-27 MED ORDER — ACETAMINOPHEN 650 MG RE SUPP: 650.0000 mg | Freq: Four times a day (QID) | RECTAL | Status: DC | PRN

## 2014-08-27 MED ORDER — FENTANYL CITRATE 0.05 MG/ML IJ SOLN
100.0000 ug | Freq: Once | INTRAMUSCULAR | Status: AC
  Administered 2014-08-27: 100 ug via INTRAVENOUS
  Filled 2014-08-27: qty 2

## 2014-08-27 MED ORDER — SENNOSIDES-DOCUSATE SODIUM 8.6-50 MG PO TABS
1.0000 | ORAL_TABLET | Freq: Two times a day (BID) | ORAL | Status: DC
  Administered 2014-08-27 – 2014-08-28 (×3): 1 via ORAL
  Filled 2014-08-27 (×3): qty 1

## 2014-08-27 MED ORDER — ACYCLOVIR 400 MG PO TABS
400.0000 mg | ORAL_TABLET | Freq: Two times a day (BID) | ORAL | Status: DC
  Administered 2014-08-27 – 2014-08-28 (×3): 400 mg via ORAL
  Filled 2014-08-27 (×3): qty 1

## 2014-08-27 MED ORDER — LORATADINE 10 MG PO TABS
10.0000 mg | ORAL_TABLET | Freq: Every day | ORAL | Status: DC
  Administered 2014-08-27 – 2014-08-28 (×2): 10 mg via ORAL
  Filled 2014-08-27 (×3): qty 1

## 2014-08-27 MED ORDER — DEXAMETHASONE 2 MG PO TABS
2.0000 mg | ORAL_TABLET | Freq: Every day | ORAL | Status: DC
  Administered 2014-08-27 – 2014-08-28 (×2): 2 mg via ORAL
  Filled 2014-08-27 (×2): qty 1

## 2014-08-27 MED ORDER — FENTANYL CITRATE 0.05 MG/ML IJ SOLN: 100.0000 ug | INTRAMUSCULAR | Status: DC | PRN

## 2014-08-27 MED ORDER — ENOXAPARIN SODIUM 80 MG/0.8ML ~~LOC~~ SOLN: 65.0000 mg | Freq: Two times a day (BID) | SUBCUTANEOUS | Status: DC

## 2014-08-27 MED ORDER — TRAZODONE HCL 50 MG PO TABS: 50.0000 mg | ORAL_TABLET | Freq: Every evening | ORAL | Status: DC | PRN

## 2014-08-27 MED ORDER — ONDANSETRON HCL 4 MG/2ML IJ SOLN
4.0000 mg | Freq: Once | INTRAMUSCULAR | Status: AC
  Administered 2014-08-27: 4 mg via INTRAVENOUS
  Filled 2014-08-27: qty 2

## 2014-08-27 MED ORDER — POLYETHYLENE GLYCOL 3350 17 G PO PACK
17.0000 g | PACK | Freq: Every day | ORAL | Status: DC
  Administered 2014-08-27 – 2014-08-28 (×2): 17 g via ORAL
  Filled 2014-08-27 (×2): qty 1

## 2014-08-27 MED ORDER — GABAPENTIN 100 MG PO CAPS
100.0000 mg | ORAL_CAPSULE | Freq: Three times a day (TID) | ORAL | Status: DC
  Administered 2014-08-27 – 2014-08-28 (×4): 100 mg via ORAL
  Filled 2014-08-27 (×5): qty 1

## 2014-08-27 MED ORDER — SODIUM CHLORIDE 0.9 % IJ SOLN: 3.0000 mL | Freq: Two times a day (BID) | INTRAMUSCULAR | Status: DC

## 2014-08-27 MED ORDER — ENOXAPARIN SODIUM 100 MG/ML ~~LOC~~ SOLN
95.0000 mg | SUBCUTANEOUS | Status: DC
  Administered 2014-08-27 – 2014-08-28 (×2): 95 mg via SUBCUTANEOUS
  Filled 2014-08-27 (×2): qty 1

## 2014-08-27 MED ORDER — HEPARIN BOLUS VIA INFUSION
3000.0000 [IU] | Freq: Once | INTRAVENOUS | Status: AC
  Administered 2014-08-27: 3000 [IU] via INTRAVENOUS
  Filled 2014-08-27: qty 3000

## 2014-08-27 MED ORDER — HEPARIN (PORCINE) IN NACL 100-0.45 UNIT/ML-% IJ SOLN
1100.0000 [IU]/h | INTRAMUSCULAR | Status: DC
  Filled 2014-08-27: qty 250

## 2014-08-27 MED ORDER — ONDANSETRON HCL 4 MG PO TABS: 4.0000 mg | ORAL_TABLET | Freq: Four times a day (QID) | ORAL | Status: DC | PRN

## 2014-08-27 MED ORDER — TRAZODONE HCL 50 MG PO TABS
100.0000 mg | ORAL_TABLET | Freq: Every evening | ORAL | Status: DC | PRN
  Administered 2014-08-27: 100 mg via ORAL
  Filled 2014-08-27: qty 2

## 2014-08-27 MED ORDER — HEPARIN (PORCINE) IN NACL 100-0.45 UNIT/ML-% IJ SOLN
12.0000 [IU]/kg/h | INTRAMUSCULAR | Status: DC
  Administered 2014-08-27: 12 [IU]/kg/h via INTRAVENOUS
  Filled 2014-08-27: qty 250

## 2014-08-27 MED ORDER — HYDROMORPHONE HCL 1 MG/ML IJ SOLN: 0.5000 mg | INTRAMUSCULAR | Status: DC | PRN

## 2014-08-27 MED ORDER — SODIUM CHLORIDE 0.9 % IV SOLN
1000.0000 mL | Freq: Once | INTRAVENOUS | Status: AC
  Administered 2014-08-27: 1000 mL via INTRAVENOUS

## 2014-08-27 MED ORDER — FLUCONAZOLE 100 MG PO TABS
100.0000 mg | ORAL_TABLET | Freq: Every day | ORAL | Status: DC
  Administered 2014-08-27 – 2014-08-28 (×2): 100 mg via ORAL
  Filled 2014-08-27 (×2): qty 1

## 2014-08-27 MED ORDER — ENOXAPARIN SODIUM 80 MG/0.8ML ~~LOC~~ SOLN
65.0000 mg | Freq: Once | SUBCUTANEOUS | Status: DC
  Filled 2014-08-27: qty 0.8

## 2014-08-27 MED ORDER — HYDROMORPHONE HCL 2 MG PO TABS: 1.0000 mg | ORAL_TABLET | ORAL | Status: DC | PRN

## 2014-08-27 MED ORDER — METHYLPHENIDATE HCL 5 MG PO TABS
5.0000 mg | ORAL_TABLET | Freq: Two times a day (BID) | ORAL | Status: DC
  Administered 2014-08-27 – 2014-08-28 (×3): 5 mg via ORAL
  Filled 2014-08-27 (×3): qty 1

## 2014-08-27 MED ORDER — PANTOPRAZOLE SODIUM 40 MG PO TBEC
40.0000 mg | DELAYED_RELEASE_TABLET | Freq: Every day | ORAL | Status: DC
  Administered 2014-08-27 – 2014-08-28 (×2): 40 mg via ORAL
  Filled 2014-08-27 (×2): qty 1

## 2014-08-27 MED ORDER — IOHEXOL 300 MG/ML  SOLN
100.0000 mL | Freq: Once | INTRAMUSCULAR | Status: AC | PRN
  Administered 2014-08-27: 100 mL via INTRAVENOUS

## 2014-08-27 MED ORDER — ACETAMINOPHEN 325 MG PO TABS
650.0000 mg | ORAL_TABLET | Freq: Four times a day (QID) | ORAL | Status: DC | PRN
  Administered 2014-08-27: 650 mg via ORAL
  Filled 2014-08-27: qty 2

## 2014-08-27 MED ORDER — SODIUM CHLORIDE 0.9 % IV SOLN
INTRAVENOUS | Status: DC
  Administered 2014-08-27 – 2014-08-28 (×2): via INTRAVENOUS

## 2014-08-27 MED ORDER — MIRTAZAPINE 15 MG PO TABS
15.0000 mg | ORAL_TABLET | Freq: Every day | ORAL | Status: DC
Start: 2014-08-27 — End: 2014-08-28
  Administered 2014-08-27: 15 mg via ORAL
  Filled 2014-08-27: qty 1

## 2014-08-27 NOTE — ED Notes (Signed)
Patient states abdominal pain that started yesterday. Patient states she took 2 dilaudid at home yesterday with relief, but tonight when she had pain she was unable to control it.

## 2014-08-27 NOTE — Consult Note (Signed)
[facility]  [phone] [phone]     ID: Carrie Berry DOB: 05-Jul-1950  MR#: [id number]  XLK#:[id number]  Patient Care Team: Seward Carol, MD as PCP - General (Internal Medicine) [name], MD as Consulting Physician (General Surgery) Thea Silversmith, MD as Consulting Physician (Radiation Oncology) PCP: [name], MD OTHER MD: Earle Gell MD  CHIEF COMPLAINT: stage IV breast cancer  CURRENT TREATMENT: doxil   BREAST CANCER HISTORY: From the originally intake note:  Carrie Berry noted a mass in her right breast December of 2013, but did not think much of it. It did grow some lower the summer, but she was keeping her grandchild at that time and was too busy so she did not bring it to Dr. Bernell List attention until August. He set her up for bilateral mammography and ultrasonography at [facility] 12/18/2012 and this measured, on the right, a large irregular lobulated mass in the upper outer quadrant which by ultrasound measured 5.7 cm. The right axilla showed some lymph nodes with thickened cortices. In the left breast there was a focal asymmetry at the depth, lateral to the nipple, and by ultrasound there was a 9 mm ill-defined hypoechoic lesion in this location. Biopsy of the left breast lesion showed only fibrocystic changes.  Biopsy of the right breast mass and right axillary adenopathy ([id number]) on 12/23/2012 showed both to be involved by invasive ductal carcinoma, grade 2, triple negative, with an MIB-1 of 86%.  MRI of the breast 12/27/2012 at [facility] imaging showed in the right breast a mass abutting the pectoralis muscle without enhancement of the muscle measuring 4.8 cm. The satellite nodule measuring approximately 3 mm was also noted superior to the mass and several abnormal and enlarged right axillary lymph nodes were noted, one of which appeared to be necrotic. The largest node measured 2.0 cm. Unfortunately, numerous bilateral pulmonary  nodules were also noted.  The patient's subsequent history is as detailed below   INTERVAL HISTORY: Carrie Berry had acute abdomina pain yesterday not relieved by dilaudid. She presented to the ED where CT abd/pelvis showed no significant change from CT chest 08/25/2014 (there is obstruction of right intrahepatic bile ducts, likely the source of the pain). Incidental DVTs in the common femoral and external iliac vv noted. She was admited for anticoagulation, pain control, and further evaluation  REVIEW OF SYSTEMS: She feels very tired this morning but pain is much better controlled. She had a similar pain 2-3 days ago but it was transient and relieved with hydromorphone. She has been somewhat constipated; has a nl BM 48 hrs ago and a small one this AM, neither hard. With the pain she had nausea and vomiting. No fever, no blood in stool or vomitus. A detailed ROS was otherwise stable  PAST MEDICAL HISTORY: Past Medical History  Diagnosis Date  . Recurrent sinus infections   . Arthritis   . PONV (postoperative nausea and vomiting)   . GERD (gastroesophageal reflux disease)   . 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 Anxiety     no problems at present  . rt breast ca dx'd 12/2011    breast  . Breast cancer   . 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 Tubal ligation    . Tubal ligation  1985  . Portacath placement N/A 01/10/2013    Procedure: ULTRASOUND GUIDED PORT-A-CATH INSERTION WITH FLUOROSCOPY;  Surgeon:  [name], MD;  Location: [facility];  Service: General;  Laterality: N/A;  . Esophagogastroduodenoscopy N/A 08/19/2013    Procedure: ESOPHAGOGASTRODUODENOSCOPY (EGD);  Surgeon: Garlan Fair, MD;  Location: [facility];  Service: Endoscopy;  Laterality: N/A;  . Total mastectomy Right 02/17/2014    Procedure: PALLIATIVE  RIGHT MASTECTOMY;  Surgeon: [name],  MD;  Location: [facility];  Service: General;  Laterality: Right;    FAMILY HISTORY Family History  Problem Relation Age of Onset  . Bladder Cancer Father   . Colon cancer Maternal Grandmother   the patient's parents are living, both in their 80s. The patient's father was diagnosed with bladder cancer the age of 33. The patient's mother's mother had some type of gastrointestinal cancer. The patient had one brother, no sisters. There is no history of breast or ovarian cancer in the family other than a cousin on the father's side who was diagnosed with breast cancer apparently before the age of 54.  GYNECOLOGIC HISTORY:  No LMP recorded. Patient is postmenopausal. Menarche age 83, first live birth age 47, the patient is [place] P2. She went through menopause in 2008. She did not take hormone replacement. She took birth control pills remotely w/o complications . 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 She's currently on disability. Her husband Kenise Barraco [name]"), is vice Development worker, international aid for [employer]. The patient's daughter [name] is a stay-at-home mom in [place], the patient's son Dailey Alberson unfortunately died in an automobile accident in December of 2011. The patient has 2 grandchildren. She attends to a local [employer].     ADVANCED DIRECTIVES: not in place; the patient wishes to be a full code; her husband [name] is her HCPOA (updated 08/27/2014)   HEALTH MAINTENANCE: History  Substance Use Topics  . Smoking status: Never Smoker   . Smokeless tobacco: Never Used  . Alcohol Use: No      Allergies  Allergen Reactions  . Codeine Nausea And Vomiting    "violently ill"  . Penicillins Rash    Childhood reaction -    Current Facility-Administered Medications  Medication Dose Route Frequency Provider Last Rate Last Dose  . acetaminophen (TYLENOL) tablet 650 mg  650  mg Oral Q6H PRN Lavina Hamman, MD       Or  . acetaminophen (TYLENOL) suppository 650 mg  650 mg Rectal Q6H PRN Lavina Hamman, MD      . acyclovir (ZOVIRAX) tablet 400 mg  400 mg Oral BID Lavina Hamman, MD      . dexamethasone (DECADRON) tablet 2 mg  2 mg Oral Daily Lavina Hamman, MD      . fluconazole (DIFLUCAN) tablet 100 mg  100 mg Oral Daily Lavina Hamman, MD      . gabapentin (NEURONTIN) capsule 100 mg  100 mg Oral TID Lavina Hamman, MD      . heparin ADULT infusion 100 units/mL (25000 units/250 mL)  1,100 Units/hr Intravenous Continuous Randa Spike, RPH 11 mL/hr at 08/27/14 0712 1,100 Units/hr at 08/27/14 3329  . HYDROmorphone (DILAUDID) injection 0.5 mg  0.5 mg Intravenous Q4H PRN Lavina Hamman, MD      . HYDROmorphone (DILAUDID) tablet 1 mg  1 mg Oral Q4H PRN Lavina Hamman, MD      . levETIRAcetam (KEPPRA) tablet 1,000 mg  1,000 mg Oral Q12H Lavina Hamman, MD      .  magnesium hydroxide (MILK OF MAGNESIA) suspension 15 mL  15 mL Oral Daily PRN Lavina Hamman, MD      . methylphenidate (RITALIN) tablet 5 mg  5 mg Oral BID WC Lavina Hamman, MD      . mirtazapine (REMERON) tablet 15 mg  15 mg Oral QHS Lavina Hamman, MD      . ondansetron [facility]) tablet 4 mg  4 mg Oral Q6H PRN Lavina Hamman, MD       Or  . ondansetron [facility]) injection 4 mg  4 mg Intravenous Q6H PRN Lavina Hamman, MD      . pantoprazole (PROTONIX) EC tablet 40 mg  40 mg Oral Daily Lavina Hamman, MD      . polyethylene glycol (MIRALAX / GLYCOLAX) packet 17 g  17 g Oral Daily Lavina Hamman, MD      . senna-docusate (Senokot-S) tablet 1 tablet  1 tablet Oral BID Lavina Hamman, MD      . sodium chloride 0.9 % injection 3 mL  3 mL Intravenous Q12H Lavina Hamman, MD      . traZODone (DESYREL) tablet 50 mg  50 mg Oral QHS PRN Lavina Hamman, MD        OBJECTIVE: midle aged White woman examined in bed Filed Vitals:   08/27/14 0628  BP: 142/74  Pulse: 74  Temp: 98.6 F (37 C)  Resp: 17     Body mass  index is 23.87 kg/(m^2).    ECOG FS:2 - Symptomatic, <50% confined to bed  Ocular: Sclerae unicteric, EOMs intact Ear-nose-throat: Oropharynx clear and moist Lymphatic: No cervical or supraclavicular adenopathy Lungs no rales or rhonchi, auscultated anterolaterally Heart regular rate and rhythm,  Abd distended, hepatomegaly palpable beyond midline, +BS MSK no focal spinal tenderness, minimal bilateral ankle edema Neuro: non-focal, well-oriented, positive affect Breasts: Right breast s/p mastectomy and radiation; no evidence of active recurrence locally  LAB RESULTS:  CMP     Component Value Date/Time   NA 138 08/27/2014 0840   NA 141 08/20/2014 1010   K 3.8 08/27/2014 0840   K 3.5 08/20/2014 1010   CL 104 08/27/2014 0840   CO2 28 08/27/2014 0840   CO2 26 08/20/2014 1010   GLUCOSE 85 08/27/2014 0840   GLUCOSE 92 08/20/2014 1010   BUN 14 08/27/2014 0840   BUN 13.8 08/20/2014 1010   CREATININE 0.35* 08/27/2014 0840   CREATININE 0.6 08/20/2014 1010   CALCIUM 8.6 08/27/2014 0840   CALCIUM 8.8 08/20/2014 1010   PROT 6.0 08/27/2014 0840   PROT 6.3* 08/20/2014 1010   ALBUMIN 2.8* 08/27/2014 0840   ALBUMIN 3.0* 08/20/2014 1010   AST 408* 08/27/2014 0840   AST 63* 08/20/2014 1010   ALT 326* 08/27/2014 0840   ALT 113* 08/20/2014 1010   ALKPHOS 347* 08/27/2014 0840   ALKPHOS 329* 08/20/2014 1010   BILITOT 1.9* 08/27/2014 0840   BILITOT 0.42 08/20/2014 1010   GFRNONAA >90 08/27/2014 0840   GFRAA >90 08/27/2014 0840    INo results found for: SPEP, UPEP  Lab Results  Component Value Date   WBC 8.9 08/27/2014   NEUTROABS 7.6 08/27/2014   HGB 11.5* 08/27/2014   HCT 35.7* 08/27/2014   MCV 100.8* 08/27/2014   PLT 192 08/27/2014    @LASTCHEMISTRY @  No results found for: LABCA2  No components found for: LABCA125  No results for input(s): INR in the last 168 hours.  Urinalysis    Component Value Date/Time  COLORURINE YELLOW 08/27/2014 [place]  08/27/2014 0238   LABSPEC 1.023 08/27/2014 0238   LABSPEC 1.005 06/08/2014 1514   PHURINE 6.5 08/27/2014 0238   GLUCOSEU NEGATIVE 08/27/2014 0238   GLUCOSEU Negative 06/08/2014 1514   HGBUR NEGATIVE 08/27/2014 0238   BILIRUBINUR NEGATIVE 08/27/2014 0238   KETONESUR NEGATIVE 08/27/2014 0238   PROTEINUR NEGATIVE 08/27/2014 0238   UROBILINOGEN 0.2 08/27/2014 0238   UROBILINOGEN 0.2 06/08/2014 1514   NITRITE NEGATIVE 08/27/2014 0238   NITRITE Negative 06/08/2014 1514   LEUKOCYTESUR NEGATIVE 08/27/2014 0238    STUDIES: Dg Chest 2 View  08/27/2014   CLINICAL DATA:  Abdominal pain.  Breast cancer with metastases  EXAM: CHEST  2 VIEW  COMPARISON:  08/25/2014  FINDINGS: Right IJ porta catheter, tip at the SVC level.  Generous heart size but no cardiomegaly when accounting for lower mediastinal fat. There is a small left pleural effusion.  No edema, definitive pneumonia, or pneumothorax.  There are multiple pulmonary metastases, most not visible on this study. Two subtle nodules at the left apex do correlate with metastatic deposits on 08/25/2014.  IMPRESSION: 1. New small left pleural effusion. 2. Pulmonary metastatic disease.   Electronically Signed   By: Monte Fantasia M.D.   On: 08/27/2014 03:44   Ct Chest W Contrast  08/25/2014   CLINICAL DATA:  Followup metastatic right breast carcinoma.  EXAM: CT CHEST WITH CONTRAST  TECHNIQUE: Multidetector CT imaging of the chest was performed during intravenous contrast administration.  CONTRAST:  41mL OMNIPAQUE IOHEXOL 300 MG/ML  SOLN  COMPARISON:  Chest CT on 05/21/2014  FINDINGS: Mediastinum/Lymph Nodes: No mediastinal or hilar lymphadenopathy identified. Prior right mastectomy again noted. Right axillary lymphadenopathy shows minimal decrease since previous study. Index lymph node in on image 18/series 2 now measures 1.4 x 1.7 cm compared to 1.8 x 2.3 cm. Other index node currently measures 2.0 x 2.1 cm on image 13 compared to 2.1 x 2.2 cm previously.   Lungs/Pleura: Bilateral pulmonary metastases are seen with some showing mild progression in size since previous exam. Two index nodules in the medial left lower lobe currently measure 1.4 x 1.6 cm compared to 0.7 x 1.1 cm previously, and 1.4 x 1.5 cm compared to 1.1 x 1.3 cm previously. Other pulmonary metastases remain stable. No evidence of pulmonary consolidation or pleural effusion.  Musculoskeletal/Soft Tissues: No suspicious bone lesions or other significant chest wall abnormality.  Upper Abdomen: Both adrenal glands remain normal in appearance. An ill-defined hypovascular mass in the central right hepatic lobe is seen which is increased in size and causes peripheral dilatation of intrahepatic bile ducts. This measures approximately 2.5 x 3.0 cm on image 52/series 2 compared to 1.0 cm previously. Other smaller liver metastases show mild decrease in size since prior exam.  IMPRESSION: Mild progression of bilateral pulmonary metastases since prior study.  Slight decrease in right axillary lymphadenopathy compared to prior exam.  Mixed response of liver metastases. While some appear decreased in size, a 3 cm ill-defined lesion in the central right hepatic lobe has increased and is now causing dilatation of peripheral intrahepatic bile ducts.   Electronically Signed   By: Earle Gell M.D.   On: 08/25/2014 17:09   Nm Cardiac Muga Rest  07/30/2014   CLINICAL DATA:  Systolic chest heart failure. Evaluate cardiac function prior to chemotherapy.  EXAM: NUCLEAR MEDICINE CARDIAC BLOOD POOL IMAGING (MUGA)  TECHNIQUE: Cardiac multi-gated acquisition was performed at rest following intravenous injection of Tc-43m labeled red blood  cells.  RADIOPHARMACEUTICALS:  Twenty-four mCiTc-66m in-vitro labeled red blood cells.  COMPARISON:  None.  FINDINGS: No focal wall motion abnormality of the left ventricle.  Calculated left ventricular ejection fraction equals 63%  IMPRESSION: Left ventricular ejection fraction equals 63%    Electronically Signed   By: Suzy Bouchard M.D.   On: 07/30/2014 13:31   Ct Abdomen Pelvis W Contrast  08/27/2014   CLINICAL DATA:  Abdominal pain for 2 days. History of breast cancer with liver and lung metastases. Undergoing chemotherapy  EXAM: CT ABDOMEN AND PELVIS WITH CONTRAST  TECHNIQUE: Multidetector CT imaging of the abdomen and pelvis was performed using the standard protocol following bolus administration of intravenous contrast.  CONTRAST:  117mL OMNIPAQUE IOHEXOL 300 MG/ML  SOLN  COMPARISON:  08/14/2014  FINDINGS: BODY WALL: Right mastectomy, partly visible.  LOWER CHEST: No contributory findings.  ABDOMEN/PELVIS:  Liver and biliary: Hepatic metastatic disease as recently characterized by MRI. As noted on chest CT 08/25/2014 1 of the metastases in the central right liver causes anterior and posterior segment right liver ductal obstruction. There is also chronic attenuation of a portal branch into the upper right liver. Cholelithiasis. There is no gallbladder distention to suggest obstruction. Prominent enhancement of the gallbladder mucosa is indeterminate.  Pancreas: Unremarkable.  Spleen: Unremarkable.  Adrenals: Unremarkable.  Kidneys and ureters: No hydronephrosis or stone.  Bladder: Unremarkable.  Reproductive: No pathologic findings.  Bowel: Formed stool throughout the colon. No bowel obstruction or inflammatory wall thickening. Negative appendix.  Retroperitoneum: No mass or adenopathy.  Peritoneum: No ascites or pneumoperitoneum.  Vascular: Bilateral common femoral and external iliac DVT, nonobstructive  OSSEOUS: No acute abnormalities.  IMPRESSION: 1. Bilateral DVT with common femoral and external iliac clot. 2. Known malignant obstruction of the right intrahepatic biliary tree. The same metastasis causes chronic portal branch occlusion or stenosis. 3. Cholelithiasis. The gallbladder mucosa is hyperenhancing, there is no distention to suggest obstruction/acute cholecystitis. 4. Probable  constipation.   Electronically Signed   By: Monte Fantasia M.D.   On: 08/27/2014 04:54   Mr Liver W Wo Contrast  08/14/2014   CLINICAL DATA:  64 year old female with metastatic breast cancer with elevated liver enzymes. An evaluate for liver metastases.  EXAM: MRI ABDOMEN WITHOUT AND WITH CONTRAST  TECHNIQUE: Multiplanar multisequence MR imaging of the abdomen was performed both before and after the administration of intravenous contrast.  CONTRAST:  54mL MULTIHANCE GADOBENATE DIMEGLUMINE 529 MG/ML IV SOLN  COMPARISON:  MRI of the abdomen 06/03/2014.  FINDINGS: Lower chest: Poorly evaluated secondary to susceptibility artifact from gas in the lungs, however, there are at least 2 large nodules in the left lower lobe, measuring up to 1.6 cm in diameter (image 88 of series 1102), slightly larger than prior chest CT 05/21/2014. Status post right mastectomy.  Hepatobiliary: Multiple hepatic lesions are again noted, however, these generally appear decreased in size and number compared to the prior examination. The largest lesion seen on the prior study in segment 4A of the liver currently measures only 18 mm (image 34 of series 1102), previously 2.3 cm on 06/03/2014. Than new lesions seen on the prior examination in segment 8 of the liver has also decreased in size, currently measuring only 11 mm (image 40 of series 1102), previously 14 mm. Additionally, other lesions have resolved entirely, including a lesion that was previously noted in segment 4A of the liver adjacent to the largest lesion, which is no longer clearly identified (lesion seen on image 23 of series 1102 of  prior examination from 06/03/2014). Several other lesions are also substantially smaller than the prior examination. No definite new hepatic lesions are noted. However, today's study does demonstrate increasing intrahepatic biliary ductal dilatation in the right lobe of the liver, which could be related to obstruction of the bile ducts at the level  of the lesion in the central aspect of segment 8 (these image 40 of series 1102), despite its decreasing size. No extrahepatic biliary ductal dilatation. Common bile duct measures 3 mm in the porta hepatis. Small gallstones are noted dependently in the gallbladder. No current findings to suggest acute cholecystitis at this time. No ductal stones identified in the common bile duct.  Pancreas: No pancreatic mass. No pancreatic ductal dilatation. No pancreatic or peripancreatic inflammatory changes or fluid collections.  Spleen: Unremarkable.  Adrenals/Urinary Tract: Normal appearance of the kidneys and adrenal glands bilaterally.  Stomach/Bowel: The appearance of the stomach in visualize small bowel and colon are unremarkable.  Vascular/Lymphatic: No aneurysm identified in the visualized abdominal vasculature. No lymphadenopathy noted in the abdomen.  Other: No significant volume of ascites in the visualized abdomen.  Musculoskeletal: No aggressive osseous lesions noted in the visualized portions of the skeleton.  IMPRESSION: 1. Today's study demonstrates a significant positive response to therapy with decreased size and decreased number of numerous hepatic metastases, as discussed above. 2. There is increasing biliary ductal dilatation in the right lobe of the liver, which appears to be centralized around a regressing lesion in the central aspect of segment 8 of the liver, implying some degree of ductal stenosis/obstruction either related to the lesion itself, or perilesional edema/inflammatory changes. 3. Pulmonary nodules in the left lower lobe appear slightly larger than prior chest CT 05/21/2014, although direct comparison between MRI and CT is limited, particularly in the thorax. Correlation with repeat noncontrast chest CT is suggested in the near future if clinically appropriate to re-evaluate the pulmonary metastasis. 4. Cholelithiasis without evidence to suggest acute cholecystitis at this time.    Electronically Signed   By: Vinnie Langton M.D.   On: 08/14/2014 16:41    ASSESSMENT: 64 y.o. Carrie Berry, [place] woman status post right breast upper outer quadrant and right axillary lymph node biopsy 12/23/2012 for a clinical T3 N1 M1, stage IV invasive ductal carcinoma, grade 3, triple negative, with an MIB-1 of 86%.  (1) right liver lobe biopsy 01/21/2013 confirms metastatic adenocarcinoma; scans show involvement of the liver, lungs, and likely bone.  (2) enrolled in [facility] study [id number] (docetaxel + oral gamma secretase inibitor [id number]), received one cycle starting 02/04/2013 but withdrew because of poor tolerance despite treatment interruptions and decreased dosing of the oral component  (3) chest CT in early November 2014 was compared with studies in August 2014 and showed interval progression of metastatic breast cancer, with an enlarging primary right breast mass, enlarging right axillary lymph nodes, enlarging pulmonary nodules and new/enlarging hepatic metastases.  (4) Abraxane started 03/19/2013, given day 1 and day 8 of each 21 day cycle, stopped with the day 1 cycle 3 dose (04/29/2013) because of local progression of disease  (5) cyclophosphamide and doxorubicin started 05/20/2013, given in dose dense fashion with Neulasta support on day 2. Completed 4 cycles 07/03/2013, with the final cycle dose reduced 15% because of an episode of febrile neutropenia after cycle 3. Restaging studies 07/15/2013 showed partial response.  (6) started capecitabine April 2015, 1.5 g by mouth twice a day, 7 days on 7 days off--discontinued October 2015 with progression  (7) s/p Right simple mastectomy  02/17/2014 to optimize local control, the pathology ([facility] [id number]) showing an invasive ductal carcinoma measuring 4.8 cm, grade 3, with close but negative margins and evidence of treatment response  (a) nodule on right mastectomy scar noted 04/03/2014, removed 04/14/2014 (b) new  nodule noted on R chest scar 05/19/2014 (c) radiation to R chest wall completed 07/22/2014  (8) multiple brain metastases noted on brain MRI 02/26/2014--s/p whole brain irradiation completed 03/17/2014 (a) repeat brain MRI 05/12/2014 shows favorable response (b) repeat brain MRI 07/15/2014 shows questionable growth/ stable disease  (9) protein-calorie malnutrition  (10) high fall risk  (11) advanced directives: not in place; husband is healthcare power of attorney  (12) unexplained paroxysmal knee pain: possible ruptured Baker's cyst, Left, 05/01/2014  (13) doxil started 05/28/2014, repeated every 28 days. Cycle 3 delayed 2 weeks because of decrease in ejection fraction, which proved spurious (MUGA results 63% EF). 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 For her peripheral disease she is receiving liposomal doxorubicin, with good tolerance and some evidence of response (mixed). Her next dose is scheduled for 09/17/2014  I believe the abdominal pain is going to be related to the area of biliary obstruction in the Right liver. I do not know if a brief course of palliative radiation would be possible for pain relief--will ask Dr Thea Silversmith to consult. In the meantime she has done well with hydromorphone and she and her husband [name] are very adept at titrating the dose to her pain. Constipation has been the major issue there.  As far as the DVTs are concerned, I would recommend lovenox at 1.5 mg/kg/day. The patient tells me she can self-administer it--she would have to be instructed.   In short, suggest: (a) increase hydromorphone to 4 mg tablets, 1-2 po Q 3h PRN (b) optimize bowel prophylaxis (miralax, stool softeners, laxative of choice PRN) (c) lovenox for DVTs--  pt will need instruction on self-administration and SW or discharge coordinator will have to make sure patient's pharmacy will be able to provide this on discharge and pt's insurance will cover it (d) will consult radiation oncology as discussed  I will be out of town until 08/31/2014. Please consult my partners for any additional concerns  I greatly appreciate your help to this patient!  Chauncey Cruel, MD   08/27/2014 9:36 AM Medical Oncology and Hematology [facility] [address] [place], [place] [address] Tel. [phone]    Fax. [phone]

## 2014-08-27 NOTE — Progress Notes (Signed)
ANTICOAGULATION CONSULT NOTE - Initial Consult  Pharmacy Consult for Lovenox Indication: DVT  Allergies  Allergen Reactions  . Codeine Nausea And Vomiting    "violently ill"  . Penicillins Rash    Childhood reaction -    Patient Measurements: Height: 5\' 4"  (162.6 cm) Weight: 139 lb 1.8 oz (63.1 kg) IBW/kg (Calculated) : 54.7 Heparin Dosing Weight: actual weight  Vital Signs: Temp: 98.6 F (37 C) (04/14 0628) Temp Source: Oral (04/14 0628) BP: 142/74 mmHg (04/14 0628) Pulse Rate: 74 (04/14 0628)  Labs:  Recent Labs  08/27/14 0021 08/27/14 0840  HGB 13.0 11.5*  HCT 40.3 35.7*  PLT 249 192  CREATININE 0.42* 0.35*  TROPONINI <0.03 <0.03    Estimated Creatinine Clearance: 63.8 mL/min (by C-G formula based on Cr of 0.35).   Medical History: Past Medical History  Diagnosis Date  . Recurrent sinus infections   . Arthritis   . PONV (postoperative nausea and vomiting)   . GERD (gastroesophageal reflux disease)   . 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 Anxiety     no problems at present  . rt breast ca dx'd 12/2011    breast  . Breast cancer   . 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  CT reveals bilateral DVT.  PMH includes metastatic breast cancer, arthritis, anxiety.  No history of anticoagulants prior to admission.  Heparin IV started in ED per MD dosing without bolus.  Upon transfer to floor, pharmacy was consulted to dose Heparin.  Pharmacy is now consulted to change from IV Heparin to Lovenox injections.  Events: 4/14 0520 Heparin started at 750 units/hr (12 units/kg/hr) 4/14 0710 Heparin bolus and increase infusion to 1100 units/hr (17.5 units/kg/hr) 4/14 1000 Heparin D/C.  Start daily Lovenox injections at 1100.   Baseline coags not available (heparin already infusing)  CBC: Hgb 13, Plt 249  Renal: SCr 0.35 with CrCl ~ 64 ml/min  AST/ALT  elevated (408/326)   Goal of Therapy:  Heparin level 0.3-0.7 units/ml Monitor platelets by anticoagulation protocol: Yes   Plan:   Lovenox 95 mg SQ once daily   Use 1.5 mg/kg once daily dosing for discharge per Dr. [name]  Begin 1 hour after heparin infusion is stopped.  Follow up CBC and renal function  Gretta Arab PharmD, BCPS Pager [phone] 08/27/2014 10:18 AM

## 2014-08-27 NOTE — Progress Notes (Signed)
ANTICOAGULATION CONSULT NOTE - Initial Consult  Pharmacy Consult for Heparin Indication: DVT  Allergies  Allergen Reactions  . Ondansetron Hcl Other (See Comments)    HEADACHE, patient states she still takes   . Codeine Nausea And Vomiting    "violently ill"  . Penicillins Rash    Childhood reaction -    Patient Measurements: Height: 5\' 4"  (162.6 cm) Weight: 139 lb 1.8 oz (63.1 kg) IBW/kg (Calculated) : 54.7 Heparin Dosing Weight: actual weight  Vital Signs: Temp: 98.6 F (37 C) (04/14 0628) Temp Source: Oral (04/14 0628) BP: 142/74 mmHg (04/14 0628) Pulse Rate: 74 (04/14 0628)  Labs:  Recent Labs  08/27/14 0021  HGB 13.0  HCT 40.3  PLT 249  CREATININE 0.42*  TROPONINI <0.03    Estimated Creatinine Clearance: 63.8 mL/min (by C-G formula based on Cr of 0.42).   Medical History: Past Medical History  Diagnosis Date  . Recurrent sinus infections   . Arthritis   . PONV (postoperative nausea and vomiting)   . GERD (gastroesophageal reflux disease)   . 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 Anxiety     no problems at present  . rt breast ca dx'd 12/2011    breast  . Breast cancer   . 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 heparin 750 Units/hr (08/27/14 3428)    Assessment: 85 yoF presents to ED on 4/14 with abdominal pain.  CT reveals bilateral DVT.  PMH includes metastatic breast cancer, arthritis, anxiety.  No history of anticoagulants prior to admission.  Heparin IV started in ED per MD dosing at 12 units/kg/hr without bolus.  Upon transfer to floor, pharmacy was consulted to dose Heparin.  Events: 4/14 0520 Heparin started at 750 units/hr (12 units/kg/hr) 4/14 0710 Heparin bolus and increase infusion to 1100 units/hr (17.5 units/kg/hr)   Baseline coags not available (heparin already infusing)  CBC: Hgb 13, Plt 249  Renal: WNL   Goal of Therapy:  Heparin level  0.3-0.7 units/ml Monitor platelets by anticoagulation protocol: Yes   Plan:   Give heparin 3000 units bolus IV x 1  Increase to heparin IV infusion at 1100 units/hr  Heparin level 6 hours after rate change.  Daily heparin level and CBC  Continue to monitor H&H and platelets  Gretta Arab PharmD, BCPS Pager [phone] 08/27/2014 7:30 AM

## 2014-08-27 NOTE — Telephone Encounter (Signed)
returned call and lvm confirming appt.... °

## 2014-08-27 NOTE — ED Provider Notes (Signed)
CSN: [id number]     Arrival date & time 08/26/14  2352 History   First MD Initiated Contact with Patient 08/27/14 0122     Chief Complaint  Patient presents with  . Abdominal Pain     (Consider location/radiation/quality/duration/timing/severity/associated sxs/prior Treatment) Patient is a 64 y.o. female presenting with abdominal pain. The history is provided by the patient.  Abdominal Pain Pain location:  RUQ Pain severity:  Severe Onset quality:  Gradual Timing:  Intermittent Progression:  Worsening Context: not trauma   Relieved by:  Nothing Worsened by:  Nothing tried Ineffective treatments: dilaudid. Associated symptoms: nausea and vomiting   Associated symptoms: no anorexia, no diarrhea and no fever   Risk factors: no alcohol abuse     Past Medical History  Diagnosis Date  . Recurrent sinus infections   . Arthritis   . PONV (postoperative nausea and vomiting)   . GERD (gastroesophageal reflux disease)   . 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 Anxiety     no problems at present  . rt breast ca dx'd 12/2011    breast  . Breast cancer   . 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 Tubal ligation    . Tubal ligation  1985  . Portacath placement N/A 01/10/2013    Procedure: ULTRASOUND GUIDED PORT-A-CATH INSERTION WITH FLUOROSCOPY;  Surgeon: [name], MD;  Location: [facility];  Service: General;  Laterality: N/A;  . Esophagogastroduodenoscopy N/A 08/19/2013    Procedure: ESOPHAGOGASTRODUODENOSCOPY (EGD);  Surgeon: Garlan Fair, MD;  Location: [facility];  Service: Endoscopy;  Laterality: N/A;  . Total mastectomy Right 02/17/2014    Procedure: PALLIATIVE  RIGHT MASTECTOMY;  Surgeon: [name], MD;  Location: [place];  Service: General;  Laterality: Right;   Family History  Problem Relation Age of Onset  . Bladder Cancer Father   . Colon cancer Maternal  Grandmother    History  Substance Use Topics  . Smoking status: Never Smoker   . Smokeless tobacco: Never Used  . Alcohol Use: No   OB History    No data available     Review of Systems  Constitutional: Negative for fever.  Gastrointestinal: Positive for nausea, vomiting and abdominal pain. Negative for diarrhea and anorexia.  All other systems reviewed and are negative.     Allergies  Ondansetron hcl; Codeine; and Penicillins  Home Medications   Prior to Admission medications   Medication Sig Start Date End Date Taking? Authorizing Provider  acyclovir (ZOVIRAX) 400 MG tablet Take 1 tablet (400 mg total) by mouth 2 (two) times daily. 07/22/14  Yes Chauncey Cruel, MD  cetirizine (ZYRTEC) 10 MG tablet Take 10 mg by mouth daily.   Yes Historical Provider, MD  Cholecalciferol (VITAMIN D PO) Take 800 Units by mouth daily.   Yes Historical Provider, MD  dexamethasone (DECADRON) 2 MG tablet Take 1 tablet by mouth daily. 08/02/14  Yes Historical Provider, MD  diphenoxylate-atropine (LOMOTIL) 2.5-0.025 MG per tablet Take 1 tablet by mouth 4 (four) times daily as needed for diarrhea or loose stools. 07/22/14  Yes Chauncey Cruel, MD  docusate sodium 100 MG CAPS Take 100 mg by mouth daily. Patient taking differently: Take 200 mg by mouth daily.  03/10/14  Yes Robbie Lis, MD  fluconazole (DIFLUCAN) 100 MG tablet Take 100 mg by mouth daily.   Yes Historical Provider, MD  gabapentin (NEURONTIN) 100 MG  capsule Take 1 capsule (100 mg total) by mouth 3 (three) times daily. 06/30/14  Yes Laurie Panda, NP  HYDROmorphone (DILAUDID) 2 MG tablet Take 0.5 tablets (1 mg total) by mouth every 4 (four) hours as needed. 06/23/14  Yes Chauncey Cruel, MD  levETIRAcetam (KEPPRA) 1000 MG tablet Take 1 tablet by mouth every 12 (twelve) hours. 08/10/14  Yes Historical Provider, MD  lidocaine-prilocaine (EMLA) cream Apply to affected area once 05/19/14  Yes Chauncey Cruel, MD  methylphenidate (RITALIN)  5 MG tablet Take 1 tablet (5 mg total) by mouth 2 (two) times daily with breakfast and lunch. 08/20/14  Yes Chauncey Cruel, MD  mirtazapine (REMERON) 30 MG tablet Take 0.5 tablets (15 mg total) by mouth at bedtime. 08/20/14  Yes Chauncey Cruel, MD  Multiple Vitamin (MULTIVITAMIN WITH MINERALS) TABS tablet Take 1 tablet by mouth daily.   Yes Historical Provider, MD  omeprazole (PRILOSEC) 20 MG capsule TAKE 1 CAPSULE (20 MG TOTAL) BY MOUTH 2 (TWO) TIMES DAILY BEFORE A MEAL. 07/21/14  Yes Chauncey Cruel, MD  prochlorperazine (COMPAZINE) 10 MG tablet TAKE 1 TABLET (10 MG TOTAL) BY MOUTH EVERY 6 (SIX) HOURS AS NEEDED (NAUSEA OR VOMITING). 06/09/14  Yes Chauncey Cruel, MD  RA KRILL OIL PO Take 1 capsule by mouth daily.   Yes Historical Provider, MD  simethicone (MYLICON) 80 MG chewable tablet Chew 1 tablet (80 mg total) by mouth every 6 (six) hours as needed for flatulence. 06/30/14  Yes Laurie Panda, NP  traZODone (DESYREL) 50 MG tablet TAKE 1 TO 2 TABLETS BY MOUTH AT BEDTIME FOR SLEEP 07/29/14  Yes Chauncey Cruel, MD  cyclobenzaprine (FLEXERIL) 5 MG tablet Take 1 tablet by mouth 3 (three) times daily as needed. Muscle spasms 08/06/14   Historical Provider, MD  ondansetron (ZOFRAN) 8 MG tablet Take 1 tablet (8 mg total) by mouth 2 (two) times daily. Start the day after chemo for 2 days. Then take as needed for nausea or vomiting. Patient not taking: Reported on 07/30/2014 05/19/14   Chauncey Cruel, MD  promethazine (PHENERGAN) 25 MG tablet Take 1 tablet (25 mg total) by mouth every 6 (six) hours as needed for nausea or vomiting. Patient not taking: Reported on 07/30/2014 05/20/14   Kristen N Ward, DO   BP 112/52 mmHg  Pulse 73  Temp(Src) 98.1 F (36.7 C) (Oral)  Resp 18  SpO2 92% Physical Exam  Constitutional: She is oriented to person, place, and time. She appears well-developed and well-nourished. No distress.  HENT:  Head: Normocephalic and atraumatic.  Mouth/Throat: Oropharynx is clear  and moist.  Eyes: Conjunctivae and EOM are normal. Pupils are equal, round, and reactive to light.  Neck: Normal range of motion. Neck supple.  Cardiovascular: Normal rate, regular rhythm and intact distal pulses.   Pulmonary/Chest: Effort normal. No respiratory distress. She has no wheezes. She has no rales.  Abdominal: Soft. Bowel sounds are normal. There is generalized tenderness. There is no rebound, no guarding, no tenderness at McBurney's point and negative Murphy's sign.  Musculoskeletal: Normal range of motion.  Neurological: She is alert and oriented to person, place, and time.  Skin: Skin is warm and dry.  Psychiatric: She has a normal mood and affect.    ED Course  Procedures (including critical care time) Labs Review Labs Reviewed  CBC WITH DIFFERENTIAL/PLATELET - Abnormal; Notable for the following:    MCV 101.3 (*)    Neutrophils Relative % 87 (*)  Neutro Abs 8.1 (*)    Lymphocytes Relative 11 (*)    Monocytes Relative 2 (*)    All other components within normal limits  COMPREHENSIVE METABOLIC PANEL - Abnormal; Notable for the following:    Glucose, Bld 133 (*)    Creatinine, Ser 0.42 (*)    AST 147 (*)    ALT 145 (*)    Alkaline Phosphatase 383 (*)    All other components within normal limits  LIPASE, BLOOD  TROPONIN I  URINALYSIS, ROUTINE W REFLEX MICROSCOPIC  I-STAT TROPOININ, ED    Imaging Review Dg Chest 2 View  08/27/2014   CLINICAL DATA:  Abdominal pain.  Breast cancer with metastases  EXAM: CHEST  2 VIEW  COMPARISON:  08/25/2014  FINDINGS: Right IJ porta catheter, tip at the SVC level.  Generous heart size but no cardiomegaly when accounting for lower mediastinal fat. There is a small left pleural effusion.  No edema, definitive pneumonia, or pneumothorax.  There are multiple pulmonary metastases, most not visible on this study. Two subtle nodules at the left apex do correlate with metastatic deposits on 08/25/2014.  IMPRESSION: 1. New small left pleural  effusion. 2. Pulmonary metastatic disease.   Electronically Signed   By: Monte Fantasia M.D.   On: 08/27/2014 03:44   Ct Chest W Contrast  08/25/2014   CLINICAL DATA:  Followup metastatic right breast carcinoma.  EXAM: CT CHEST WITH CONTRAST  TECHNIQUE: Multidetector CT imaging of the chest was performed during intravenous contrast administration.  CONTRAST:  25mL OMNIPAQUE IOHEXOL 300 MG/ML  SOLN  COMPARISON:  Chest CT on 05/21/2014  FINDINGS: Mediastinum/Lymph Nodes: No mediastinal or hilar lymphadenopathy identified. Prior right mastectomy again noted. Right axillary lymphadenopathy shows minimal decrease since previous study. Index lymph node in on image 18/series 2 now measures 1.4 x 1.7 cm compared to 1.8 x 2.3 cm. Other index node currently measures 2.0 x 2.1 cm on image 13 compared to 2.1 x 2.2 cm previously.  Lungs/Pleura: Bilateral pulmonary metastases are seen with some showing mild progression in size since previous exam. Two index nodules in the medial left lower lobe currently measure 1.4 x 1.6 cm compared to 0.7 x 1.1 cm previously, and 1.4 x 1.5 cm compared to 1.1 x 1.3 cm previously. Other pulmonary metastases remain stable. No evidence of pulmonary consolidation or pleural effusion.  Musculoskeletal/Soft Tissues: No suspicious bone lesions or other significant chest wall abnormality.  Upper Abdomen: Both adrenal glands remain normal in appearance. An ill-defined hypovascular mass in the central right hepatic lobe is seen which is increased in size and causes peripheral dilatation of intrahepatic bile ducts. This measures approximately 2.5 x 3.0 cm on image 52/series 2 compared to 1.0 cm previously. Other smaller liver metastases show mild decrease in size since prior exam.  IMPRESSION: Mild progression of bilateral pulmonary metastases since prior study.  Slight decrease in right axillary lymphadenopathy compared to prior exam.  Mixed response of liver metastases. While some appear decreased in  size, a 3 cm ill-defined lesion in the central right hepatic lobe has increased and is now causing dilatation of peripheral intrahepatic bile ducts.   Electronically Signed   By: Earle Gell M.D.   On: 08/25/2014 17:09   Ct Abdomen Pelvis W Contrast  08/27/2014   CLINICAL DATA:  Abdominal pain for 2 days. History of breast cancer with liver and lung metastases. Undergoing chemotherapy  EXAM: CT ABDOMEN AND PELVIS WITH CONTRAST  TECHNIQUE: Multidetector CT imaging of the abdomen and  pelvis was performed using the standard protocol following bolus administration of intravenous contrast.  CONTRAST:  173mL OMNIPAQUE IOHEXOL 300 MG/ML  SOLN  COMPARISON:  08/14/2014  FINDINGS: BODY WALL: Right mastectomy, partly visible.  LOWER CHEST: No contributory findings.  ABDOMEN/PELVIS:  Liver and biliary: Hepatic metastatic disease as recently characterized by MRI. As noted on chest CT 08/25/2014 1 of the metastases in the central right liver causes anterior and posterior segment right liver ductal obstruction. There is also chronic attenuation of a portal branch into the upper right liver. Cholelithiasis. There is no gallbladder distention to suggest obstruction. Prominent enhancement of the gallbladder mucosa is indeterminate.  Pancreas: Unremarkable.  Spleen: Unremarkable.  Adrenals: Unremarkable.  Kidneys and ureters: No hydronephrosis or stone.  Bladder: Unremarkable.  Reproductive: No pathologic findings.  Bowel: Formed stool throughout the colon. No bowel obstruction or inflammatory wall thickening. Negative appendix.  Retroperitoneum: No mass or adenopathy.  Peritoneum: No ascites or pneumoperitoneum.  Vascular: Bilateral common femoral and external iliac DVT, nonobstructive  OSSEOUS: No acute abnormalities.  IMPRESSION: 1. Bilateral DVT with common femoral and external iliac clot. 2. Known malignant obstruction of the right intrahepatic biliary tree. The same metastasis causes chronic portal branch occlusion or  stenosis. 3. Cholelithiasis. The gallbladder mucosa is hyperenhancing, there is no distention to suggest obstruction/acute cholecystitis. 4. Probable constipation.   Electronically Signed   By: Monte Fantasia M.D.   On: 08/27/2014 04:54     EKG Interpretation   Date/Time:  Thursday Leonila Speranza 14 2016 02:20:28 EDT Ventricular Rate:  76 PR Interval:  170 QRS Duration: 68 QT Interval:  424 QTC Calculation: 477 R Axis:   43 Text Interpretation:  Normal sinus rhythm with sinus arrhythmia Confirmed  by [facility]  MD, Maurie Olesen ([id number]) on 08/27/2014 3:01:20 AM      MDM   Final diagnoses:  Pain  DVT (deep venous thrombosis), bilateral  Pain of upper abdomen    Medications  fentaNYL (SUBLIMAZE) injection 100 mcg (not administered)  heparin ADULT infusion 100 units/mL (25000 units/250 mL) (12 Units/kg/hr  63.4 kg Intravenous New Bag/Given 08/27/14 0523)  0.9 %  sodium chloride infusion (1,000 mLs Intravenous New Bag/Given 08/27/14 0024)  ondansetron (ZOFRAN) injection 4 mg (4 mg Intravenous Given 08/27/14 0024)  fentaNYL (SUBLIMAZE) injection 100 mcg (100 mcg Intravenous Given 08/27/14 0046)  iohexol (OMNIPAQUE) 300 MG/ML solution 50 mL (50 mLs Oral Contrast Given 08/27/14 0237)  iohexol (OMNIPAQUE) 300 MG/ML solution 100 mL (100 mLs Intravenous Contrast Given 08/27/14 0347)   Results for orders placed or performed during the hospital encounter of 08/26/14  CBC with Differential  Result Value Ref Range   WBC 9.4 4.0 - 10.5 K/uL   RBC 3.98 3.87 - 5.11 MIL/uL   Hemoglobin 13.0 12.0 - 15.0 g/dL   HCT 40.3 36.0 - 46.0 %   MCV 101.3 (H) 78.0 - 100.0 fL   MCH 32.7 26.0 - 34.0 pg   MCHC 32.3 30.0 - 36.0 g/dL   RDW 15.0 11.5 - 15.5 %   Platelets 249 150 - 400 K/uL   Neutrophils Relative % 87 (H) 43 - 77 %   Neutro Abs 8.1 (H) 1.7 - 7.7 K/uL   Lymphocytes Relative 11 (L) 12 - 46 %   Lymphs Abs 1.0 0.7 - 4.0 K/uL   Monocytes Relative 2 (L) 3 - 12 %   Monocytes Absolute 0.2 0.1 - 1.0 K/uL    Eosinophils Relative 0 0 - 5 %   Eosinophils Absolute 0.0 0.0 - 0.7  K/uL   Basophils Relative 0 0 - 1 %   Basophils Absolute 0.0 0.0 - 0.1 K/uL  Comprehensive metabolic panel  Result Value Ref Range   Sodium 140 135 - 145 mmol/L   Potassium 3.9 3.5 - 5.1 mmol/L   Chloride 103 96 - 112 mmol/L   CO2 25 19 - 32 mmol/L   Glucose, Bld 133 (H) 70 - 99 mg/dL   BUN 20 6 - 23 mg/dL   Creatinine, Ser 0.42 (L) 0.50 - 1.10 mg/dL   Calcium 9.1 8.4 - 10.5 mg/dL   Total Protein 7.0 6.0 - 8.3 g/dL   Albumin 3.5 3.5 - 5.2 g/dL   AST 147 (H) 0 - 37 U/L   ALT 145 (H) 0 - 35 U/L   Alkaline Phosphatase 383 (H) 39 - 117 U/L   Total Bilirubin 0.9 0.3 - 1.2 mg/dL   GFR calc non Af Amer >90 >90 mL/min   GFR calc Af Amer >90 >90 mL/min   Anion gap 12 5 - 15  Lipase, blood  Result Value Ref Range   Lipase 44 11 - 59 U/L  Troponin I  (only if pt is 62 y.o. or older and pain is above umbilicus)  Result Value Ref Range   Troponin I <0.03 <0.031 ng/mL  Urinalysis, Routine w reflex microscopic  Result Value Ref Range   Color, Urine YELLOW YELLOW   APPearance CLEAR CLEAR   Specific Gravity, Urine 1.023 1.005 - 1.030   pH 6.5 5.0 - 8.0   Glucose, UA NEGATIVE NEGATIVE mg/dL   Hgb urine dipstick NEGATIVE NEGATIVE   Bilirubin Urine NEGATIVE NEGATIVE   Ketones, ur NEGATIVE NEGATIVE mg/dL   Protein, ur NEGATIVE NEGATIVE mg/dL   Urobilinogen, UA 0.2 0.0 - 1.0 mg/dL   Nitrite NEGATIVE NEGATIVE   Leukocytes, UA NEGATIVE NEGATIVE  I-stat troponin, ED (only if pt is 62 y.o. or older & pain is above umbilicus) - do not order at [facility]  Result Value Ref Range   Troponin i, poc 0.00 0.00 - 0.08 ng/mL   Comment 3           Dg Chest 2 View  08/27/2014   CLINICAL DATA:  Abdominal pain.  Breast cancer with metastases  EXAM: CHEST  2 VIEW  COMPARISON:  08/25/2014  FINDINGS: Right IJ porta catheter, tip at the SVC level.  Generous heart size but no cardiomegaly when accounting for lower mediastinal fat. There is a small  left pleural effusion.  No edema, definitive pneumonia, or pneumothorax.  There are multiple pulmonary metastases, most not visible on this study. Two subtle nodules at the left apex do correlate with metastatic deposits on 08/25/2014.  IMPRESSION: 1. New small left pleural effusion. 2. Pulmonary metastatic disease.   Electronically Signed   By: Monte Fantasia M.D.   On: 08/27/2014 03:44   Ct Chest W Contrast  08/25/2014   CLINICAL DATA:  Followup metastatic right breast carcinoma.  EXAM: CT CHEST WITH CONTRAST  TECHNIQUE: Multidetector CT imaging of the chest was performed during intravenous contrast administration.  CONTRAST:  45mL OMNIPAQUE IOHEXOL 300 MG/ML  SOLN  COMPARISON:  Chest CT on 05/21/2014  FINDINGS: Mediastinum/Lymph Nodes: No mediastinal or hilar lymphadenopathy identified. Prior right mastectomy again noted. Right axillary lymphadenopathy shows minimal decrease since previous study. Index lymph node in on image 18/series 2 now measures 1.4 x 1.7 cm compared to 1.8 x 2.3 cm. Other index node currently measures 2.0 x 2.1 cm on image 13 compared to  2.1 x 2.2 cm previously.  Lungs/Pleura: Bilateral pulmonary metastases are seen with some showing mild progression in size since previous exam. Two index nodules in the medial left lower lobe currently measure 1.4 x 1.6 cm compared to 0.7 x 1.1 cm previously, and 1.4 x 1.5 cm compared to 1.1 x 1.3 cm previously. Other pulmonary metastases remain stable. No evidence of pulmonary consolidation or pleural effusion.  Musculoskeletal/Soft Tissues: No suspicious bone lesions or other significant chest wall abnormality.  Upper Abdomen: Both adrenal glands remain normal in appearance. An ill-defined hypovascular mass in the central right hepatic lobe is seen which is increased in size and causes peripheral dilatation of intrahepatic bile ducts. This measures approximately 2.5 x 3.0 cm on image 52/series 2 compared to 1.0 cm previously. Other smaller liver  metastases show mild decrease in size since prior exam.  IMPRESSION: Mild progression of bilateral pulmonary metastases since prior study.  Slight decrease in right axillary lymphadenopathy compared to prior exam.  Mixed response of liver metastases. While some appear decreased in size, a 3 cm ill-defined lesion in the central right hepatic lobe has increased and is now causing dilatation of peripheral intrahepatic bile ducts.   Electronically Signed   By: Earle Gell M.D.   On: 08/25/2014 17:09   Nm Cardiac Muga Rest  07/30/2014   CLINICAL DATA:  Systolic chest heart failure. Evaluate cardiac function prior to chemotherapy.  EXAM: NUCLEAR MEDICINE CARDIAC BLOOD POOL IMAGING (MUGA)  TECHNIQUE: Cardiac multi-gated acquisition was performed at rest following intravenous injection of Tc-86m labeled red blood cells.  RADIOPHARMACEUTICALS:  Twenty-four mCiTc-54m in-vitro labeled red blood cells.  COMPARISON:  None.  FINDINGS: No focal wall motion abnormality of the left ventricle.  Calculated left ventricular ejection fraction equals 63%  IMPRESSION: Left ventricular ejection fraction equals 63%   Electronically Signed   By: Suzy Bouchard M.D.   On: 07/30/2014 13:31   Ct Abdomen Pelvis W Contrast  08/27/2014   CLINICAL DATA:  Abdominal pain for 2 days. History of breast cancer with liver and lung metastases. Undergoing chemotherapy  EXAM: CT ABDOMEN AND PELVIS WITH CONTRAST  TECHNIQUE: Multidetector CT imaging of the abdomen and pelvis was performed using the standard protocol following bolus administration of intravenous contrast.  CONTRAST:  194mL OMNIPAQUE IOHEXOL 300 MG/ML  SOLN  COMPARISON:  08/14/2014  FINDINGS: BODY WALL: Right mastectomy, partly visible.  LOWER CHEST: No contributory findings.  ABDOMEN/PELVIS:  Liver and biliary: Hepatic metastatic disease as recently characterized by MRI. As noted on chest CT 08/25/2014 1 of the metastases in the central right liver causes anterior and posterior segment  right liver ductal obstruction. There is also chronic attenuation of a portal branch into the upper right liver. Cholelithiasis. There is no gallbladder distention to suggest obstruction. Prominent enhancement of the gallbladder mucosa is indeterminate.  Pancreas: Unremarkable.  Spleen: Unremarkable.  Adrenals: Unremarkable.  Kidneys and ureters: No hydronephrosis or stone.  Bladder: Unremarkable.  Reproductive: No pathologic findings.  Bowel: Formed stool throughout the colon. No bowel obstruction or inflammatory wall thickening. Negative appendix.  Retroperitoneum: No mass or adenopathy.  Peritoneum: No ascites or pneumoperitoneum.  Vascular: Bilateral common femoral and external iliac DVT, nonobstructive  OSSEOUS: No acute abnormalities.  IMPRESSION: 1. Bilateral DVT with common femoral and external iliac clot. 2. Known malignant obstruction of the right intrahepatic biliary tree. The same metastasis causes chronic portal branch occlusion or stenosis. 3. Cholelithiasis. The gallbladder mucosa is hyperenhancing, there is no distention to suggest obstruction/acute cholecystitis. 4.  Probable constipation.   Electronically Signed   By: Monte Fantasia M.D.   On: 08/27/2014 04:54   Mr Liver W Wo Contrast  08/14/2014   CLINICAL DATA:  64 year old female with metastatic breast cancer with elevated liver enzymes. An evaluate for liver metastases.  EXAM: MRI ABDOMEN WITHOUT AND WITH CONTRAST  TECHNIQUE: Multiplanar multisequence MR imaging of the abdomen was performed both before and after the administration of intravenous contrast.  CONTRAST:  36mL MULTIHANCE GADOBENATE DIMEGLUMINE 529 MG/ML IV SOLN  COMPARISON:  MRI of the abdomen 06/03/2014.  FINDINGS: Lower chest: Poorly evaluated secondary to susceptibility artifact from gas in the lungs, however, there are at least 2 large nodules in the left lower lobe, measuring up to 1.6 cm in diameter (image 88 of series 1102), slightly larger than prior chest CT 05/21/2014.  Status post right mastectomy.  Hepatobiliary: Multiple hepatic lesions are again noted, however, these generally appear decreased in size and number compared to the prior examination. The largest lesion seen on the prior study in segment 4A of the liver currently measures only 18 mm (image 34 of series 1102), previously 2.3 cm on 06/03/2014. Than new lesions seen on the prior examination in segment 8 of the liver has also decreased in size, currently measuring only 11 mm (image 40 of series 1102), previously 14 mm. Additionally, other lesions have resolved entirely, including a lesion that was previously noted in segment 4A of the liver adjacent to the largest lesion, which is no longer clearly identified (lesion seen on image 23 of series 1102 of prior examination from 06/03/2014). Several other lesions are also substantially smaller than the prior examination. No definite new hepatic lesions are noted. However, today's study does demonstrate increasing intrahepatic biliary ductal dilatation in the right lobe of the liver, which could be related to obstruction of the bile ducts at the level of the lesion in the central aspect of segment 8 (these image 40 of series 1102), despite its decreasing size. No extrahepatic biliary ductal dilatation. Common bile duct measures 3 mm in the porta hepatis. Small gallstones are noted dependently in the gallbladder. No current findings to suggest acute cholecystitis at this time. No ductal stones identified in the common bile duct.  Pancreas: No pancreatic mass. No pancreatic ductal dilatation. No pancreatic or peripancreatic inflammatory changes or fluid collections.  Spleen: Unremarkable.  Adrenals/Urinary Tract: Normal appearance of the kidneys and adrenal glands bilaterally.  Stomach/Bowel: The appearance of the stomach in visualize small bowel and colon are unremarkable.  Vascular/Lymphatic: No aneurysm identified in the visualized abdominal vasculature. No lymphadenopathy  noted in the abdomen.  Other: No significant volume of ascites in the visualized abdomen.  Musculoskeletal: No aggressive osseous lesions noted in the visualized portions of the skeleton.  IMPRESSION: 1. Today's study demonstrates a significant positive response to therapy with decreased size and decreased number of numerous hepatic metastases, as discussed above. 2. There is increasing biliary ductal dilatation in the right lobe of the liver, which appears to be centralized around a regressing lesion in the central aspect of segment 8 of the liver, implying some degree of ductal stenosis/obstruction either related to the lesion itself, or perilesional edema/inflammatory changes. 3. Pulmonary nodules in the left lower lobe appear slightly larger than prior chest CT 05/21/2014, although direct comparison between MRI and CT is limited, particularly in the thorax. Correlation with repeat noncontrast chest CT is suggested in the near future if clinically appropriate to re-evaluate the pulmonary metastasis. 4. Cholelithiasis without evidence to  suggest acute cholecystitis at this time.   Electronically Signed   By: Vinnie Langton M.D.   On: 08/14/2014 16:41    Admit to tele per patel    Gurtaj Ruz, MD 08/27/14 [phone]

## 2014-08-27 NOTE — Care Management Note (Addendum)
    Page 1 of 1   08/28/2014     1:03:40 PM CARE MANAGEMENT NOTE 08/28/2014  Patient:  Carrie Berry, Carrie Berry   Account Number:  [id number]  Date Initiated:  08/27/2014  Documentation initiated by:  Dessa Phi  Subjective/Objective Assessment:   64 y/o f admitted w/Bilateral DVT.     Action/Plan:   From home.   Anticipated DC Date:  08/28/2014   Anticipated DC Plan:  [place]  CM consult      Choice offered to / List presented to:             Status of service:  Completed, signed off Medicare Important Message given?   (If response is "NO", the following Medicare IM given date fields will be blank) Date Medicare IM given:   Medicare IM given by:   Date Additional Medicare IM given:   Additional Medicare IM given by:    Discharge Disposition:  HOME/SELF CARE  Per UR Regulation:  Reviewed for med. necessity/level of care/duration of stay  If discussed at [place] of Stay Meetings, dates discussed:    Comments:  08/28/14 Dessa Phi RN BSN NCM [phone] No d/c needs.  08/27/14 Dessa Phi RN BSN NCM [phone] Monitor progress for d/c needs.

## 2014-08-27 NOTE — Progress Notes (Signed)
Patient ID: Carrie Berry, female   DOB: 03/09/1951, 64 y.o.   MRN: [id number] [facility] PROGRESS NOTE  PASCALE MAVES OJJ:[id number] DOB: [birth year] DOA: 08/26/2014 PCP: [name], MD  Brief narrative:     Addendum to admission note done 08/27/2014. 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 She presented to [facility] ED with abdominal pain in upper abdominal region, associated nausea and vomiting for past few days prior to this admission. She also had leg swelling which she recently noted. CXR on admission showed pulmonary metastatic disease. CT abdomen showed bilateral DVT in common femoral and external iliac veins. LE doppler confirmed this finding. She was started on Lovenox for DVT treatment.   Assessment/Plan:    Principal Problem: Abdominal pain, nausea and vomiting - Possibly from recent chemotherapy 08/20/2014. CT abd on admission showed DVT in femoral and iliac vein. She also has malignant obstruction of the right intrahepatic biliary tree. That could be the cause of abd pain as well. - Continue supportive care with IV fluids - Continue antiemetics as needed and analgesia as needed - Says she feels better this am  Active Problems:   DVT (deep venous thrombosis) - in femoral and iliac vein bilaterally - On Lovenox    Breast cancer of right female breast s/p palliative mastectomy 02/17/14, pulmonary , hepatic and brain mets  - On chemotherapy, last infusion 08/20/2014 - Under Dr. Virgie Dad care - Continue keppra, decadron    Thrush - Continue acyclovir and fluconazole     DVT Prophylaxis  - Lovenox subQ for DVT treatment.   Code Status: Full.  Family Communication:  plan of care discussed with the patient Disposition Plan:  monitor for bleed while on [facility] with Lovenox for DVT treatment.   IV access:  Peripheral IV  Procedures and diagnostic studies:    Dg Chest 2 View 08/27/2014   1. New small left pleural effusion. 2. Pulmonary metastatic disease.     Ct Chest W Contrast 08/25/2014   Mild progression of bilateral pulmonary metastases since prior study.  Slight decrease in right axillary lymphadenopathy compared to prior exam.  Mixed response of liver metastases. While some appear decreased in size, a 3 cm ill-defined lesion in the central right hepatic lobe has increased and is now causing dilatation of peripheral intrahepatic bile ducts.     Ct Abdomen Pelvis W Contrast 08/27/2014 1. Bilateral DVT with common femoral and external iliac clot. 2. Known malignant obstruction of the right intrahepatic biliary tree. The same metastasis causes chronic portal branch occlusion or stenosis. 3. Cholelithiasis. The gallbladder mucosa is hyperenhancing, there is no distention to suggest obstruction/acute cholecystitis. 4. Probable constipation.    Medical Consultants:  Oncology, Dr. [name]  Other Consultants:  None   IAnti-Infectives:   Acyclovir Fluconazole    [name], MD  [facility] Pager [phone]  Time spent in minutes: 25 minutes  If 7PM-7AM, please contact night-coverage [email or web address] Password [facility] 08/27/2014, 5:27 PM   LOS: 0 days    HPI/Subjective: No acute overnight events.  Objective: Filed Vitals:   08/27/14 0530 08/27/14 0600 08/27/14 0628 08/27/14 1416  BP: 149/79 122/71 142/74 113/69  Pulse: 74 77 74 87  Temp:   98.6 F (37 C) 97.7 F (36.5 C)  TempSrc:   Oral Oral  Resp:   17 18  Height:   5\' 4"  (1.626 m)   Weight:   63.1 kg (139 lb 1.8 oz)   SpO2: 96% 95% 99% 98%    Intake/Output Summary (Last 24 hours) at 08/27/14 1727 Last data filed at 08/27/14 1500  Gross per 24 hour  Intake    470 ml  Output    950 ml  Net   -480 ml    Exam:   General:  Pt is  alert, follows commands appropriately, not in acute distress  Cardiovascular: Regular rate and rhythm, S1/S2, no murmurs  Respiratory: Clear to auscultation bilaterally, no wheezing, no crackles, no rhonchi  Abdomen: Soft, non tender, non distended, bowel sounds present  Extremities: No edema, pulses DP and PT palpable bilaterally  Neuro: Grossly nonfocal  Data Reviewed: Basic Metabolic Panel:  Recent Labs Lab 08/27/14 0021 08/27/14 0840  NA 140 138  K 3.9 3.8  CL 103 104  CO2 25 28  GLUCOSE 133* 85  BUN 20 14  CREATININE 0.42* 0.35*  CALCIUM 9.1 8.6   Liver Function Tests:  Recent Labs Lab 08/27/14 0021 08/27/14 0840  AST 147* 408*  ALT 145* 326*  ALKPHOS 383* 347*  BILITOT 0.9 1.9*  PROT 7.0 6.0  ALBUMIN 3.5 2.8*    Recent Labs Lab 08/27/14 0021  LIPASE 44   No results for input(s): AMMONIA in the last 168 hours. CBC:  Recent Labs Lab 08/27/14 0021 08/27/14 0840  WBC 9.4 8.9  NEUTROABS 8.1* 7.6  HGB 13.0 11.5*  HCT 40.3 35.7*  MCV 101.3* 100.8*  PLT 249 192   Cardiac Enzymes:  Recent Labs Lab 08/27/14 0021 08/27/14 0840  TROPONINI <0.03 <0.03   BNP: Invalid input(s): POCBNP CBG: No results for input(s): GLUCAP in the last 168 hours.  No results found for this or any previous visit (from the past 240 hour(s)).   Scheduled Meds: . acyclovir  400 mg Oral BID  . dexamethasone  2 mg Oral Daily  . enoxaparin (LOVENOX) injection  95 mg Subcutaneous Q24H  . fluconazole  100 mg Oral Daily  . gabapentin  100 mg Oral TID  . levETIRAcetam  1,000 mg Oral Q12H  . methylphenidate  5 mg Oral BID WC  . mirtazapine  15 mg Oral QHS  . pantoprazole  40 mg Oral Daily  . polyethylene glycol  17 g Oral Daily  . senna-docusate  1 tablet Oral BID  . sodium chloride  3 mL Intravenous Q12H   Continuous Infusions: . sodium chloride 50 mL/hr at 08/27/14 1342

## 2014-08-27 NOTE — H&P (Signed)
[facility] History and Physical  Patient: Carrie Berry  MRN: [id number]  DOB: 16-Apr-1951  DOS: the patient was seen and examined on 08/27/2014 PCP: [name], MD  Chief Complaint: Abdominal pain  HPI: Carrie Berry is a 64 y.o. female with Past medical history of breast cancer with metastasis to liver lung as well as brain on chemotherapy, neuropathy, GERD. The patient is presenting with complaints of abdominal pain. Patient mentions the pain is located in the upper abdomen primarily underneath her breast bone. This is located on both side and it feels like a sharp pain. This pain has been present for last few weeks and has been progressively worsening. Patient also has episodes of nausea. She had some vomiting as well since last 2 days. She has been having regular daily bowel movement without any blood. She denies any burning urination. She complains of a chest pain located primarily underneath her breast bilaterally worsening with a deep breath. She denies any dizziness or lightheadedness. She denies any fatigue and tiredness or dehydration. She was [place] at the beach which was [place] 3-1/2 hour drive in which she had a break at one hour. She has noted some swelling of her legs when she was at the beach. She has received whole brain radiation 03/17/2014 for brain metastasis and recent MRI shows stable disease  The patient is coming from home. And at her baseline independent for most of her ADL.  Review of Systems: as mentioned in the history of present illness.  A comprehensive review of the other systems is negative.  Past Medical History  Diagnosis Date  . Recurrent sinus infections   . Arthritis   . PONV (postoperative nausea and vomiting)   . GERD (gastroesophageal reflux disease)   . 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 Anxiety     no problems at present  . rt breast ca dx'd 12/2011    breast  . Breast cancer   . Metastasis from  malignant tumor of breast 01/2013    to liver   Past Surgical History  Procedure Laterality Date  . Tubal ligation    . Tubal ligation  1985  . Portacath placement N/A 01/10/2013    Procedure: ULTRASOUND GUIDED PORT-A-CATH INSERTION WITH FLUOROSCOPY;  Surgeon: [name], MD;  Location: [place];  Service: General;  Laterality: N/A;  . Esophagogastroduodenoscopy N/A 08/19/2013    Procedure: ESOPHAGOGASTRODUODENOSCOPY (EGD);  Surgeon: Garlan Fair, MD;  Location: [facility];  Service: Endoscopy;  Laterality: N/A;  . Total mastectomy Right 02/17/2014    Procedure: PALLIATIVE  RIGHT MASTECTOMY;  Surgeon: [name], MD;  Location: [facility];  Service: General;  Laterality: Right;   Social History:  reports that she has never smoked. She has never used smokeless tobacco. She reports that she does not drink alcohol or use illicit drugs.  Allergies  Allergen Reactions  . Codeine Nausea And Vomiting    "violently ill"  . Penicillins Rash    Childhood reaction -    Family History  Problem Relation Age of Onset  . Bladder Cancer Father   . Colon cancer Maternal Grandmother     Prior to Admission medications   Medication Sig Start Date End Date Taking? Authorizing Provider  acyclovir (ZOVIRAX) 400 MG tablet Take 1 tablet (400 mg total) by mouth 2 (two) times daily. 07/22/14  Yes Chauncey Cruel, MD  cetirizine (ZYRTEC) 10 MG tablet Take 10 mg by mouth daily.   Yes Historical Provider, MD  Cholecalciferol (VITAMIN D PO) Take 800 Units by mouth daily.   Yes Historical Provider, MD  dexamethasone (DECADRON) 2 MG tablet Take 1 tablet by mouth daily. 08/02/14  Yes Historical Provider, MD  diphenoxylate-atropine (LOMOTIL) 2.5-0.025 MG per tablet Take 1 tablet by mouth 4 (four) times daily as needed for diarrhea or loose stools. 07/22/14  Yes Chauncey Cruel, MD  docusate sodium 100 MG CAPS Take 100 mg by mouth daily. Patient taking differently: Take 200 mg by mouth daily.  03/10/14  Yes  Robbie Lis, MD  fluconazole (DIFLUCAN) 100 MG tablet Take 100 mg by mouth daily.   Yes Historical Provider, MD  gabapentin (NEURONTIN) 100 MG capsule Take 1 capsule (100 mg total) by mouth 3 (three) times daily. 06/30/14  Yes Laurie Panda, NP  HYDROmorphone (DILAUDID) 2 MG tablet Take 0.5 tablets (1 mg total) by mouth every 4 (four) hours as needed. 06/23/14  Yes Chauncey Cruel, MD  levETIRAcetam (KEPPRA) 1000 MG tablet Take 1 tablet by mouth every 12 (twelve) hours. 08/10/14  Yes Historical Provider, MD  lidocaine-prilocaine (EMLA) cream Apply to affected area once 05/19/14  Yes Chauncey Cruel, MD  methylphenidate (RITALIN) 5 MG tablet Take 1 tablet (5 mg total) by mouth 2 (two) times daily with breakfast and lunch. 08/20/14  Yes Chauncey Cruel, MD  mirtazapine (REMERON) 30 MG tablet Take 0.5 tablets (15 mg total) by mouth at bedtime. 08/20/14  Yes Chauncey Cruel, MD  Multiple Vitamin (MULTIVITAMIN WITH MINERALS) TABS tablet Take 1 tablet by mouth daily.   Yes Historical Provider, MD  omeprazole (PRILOSEC) 20 MG capsule TAKE 1 CAPSULE (20 MG TOTAL) BY MOUTH 2 (TWO) TIMES DAILY BEFORE A MEAL. 07/21/14  Yes Chauncey Cruel, MD  prochlorperazine (COMPAZINE) 10 MG tablet TAKE 1 TABLET (10 MG TOTAL) BY MOUTH EVERY 6 (SIX) HOURS AS NEEDED (NAUSEA OR VOMITING). 06/09/14  Yes Chauncey Cruel, MD  RA KRILL OIL PO Take 1 capsule by mouth daily.   Yes Historical Provider, MD  simethicone (MYLICON) 80 MG chewable tablet Chew 1 tablet (80 mg total) by mouth every 6 (six) hours as needed for flatulence. 06/30/14  Yes Laurie Panda, NP  traZODone (DESYREL) 50 MG tablet TAKE 1 TO 2 TABLETS BY MOUTH AT BEDTIME FOR SLEEP 07/29/14  Yes Chauncey Cruel, MD  cyclobenzaprine (FLEXERIL) 5 MG tablet Take 1 tablet by mouth 3 (three) times daily as needed. Muscle spasms 08/06/14   Historical Provider, MD  ondansetron (ZOFRAN) 8 MG tablet Take 1 tablet (8 mg total) by mouth 2 (two) times daily. Start the day  after chemo for 2 days. Then take as needed for nausea or vomiting. Patient not taking: Reported on 07/30/2014 05/19/14   Chauncey Cruel, MD  promethazine (PHENERGAN) 25 MG tablet Take 1 tablet (25 mg total) by mouth every 6 (six) hours as needed for nausea or vomiting. Patient not taking: Reported on 07/30/2014 05/20/14   Delice Bison Ward, DO    Physical Exam: Filed Vitals:   08/27/14 0500 08/27/14 0530 08/27/14 0600 08/27/14 0628  BP: 112/52 149/79 122/71 142/74  Pulse: 73 74 77 74  Temp:    98.6 F (37 C)  TempSrc:    Oral  Resp:    17  Height:    5\' 4"  (1.626 m)  Weight:    63.1 kg (139 lb 1.8 oz)  SpO2: 92% 96% 95% 99%    General: Alert, Awake and Oriented to Time, Place and  Person. Appear in mild distress Eyes: PERRL ENT: Oral Mucosa clear moist. Neck: no JVD Cardiovascular: S1 and S2 Present, no Murmur, Peripheral Pulses Present Respiratory: Bilateral Air entry equal and Decreased,  Clear to Auscultation, onCrackles, no wheezes Abdomen: Bowel Sound present sluggish, Soft and diffusely upper abdomen tender Skin: no Rash Extremities: Bilateral Pedal edema, no calf tenderness Neurologic: Grossly no focal neuro deficit.  Labs on Admission:  CBC:  Recent Labs Lab 08/20/14 1010 08/27/14 0021  WBC 12.7* 9.4  NEUTROABS 10.1* 8.1*  HGB 12.5 13.0  HCT 39.0 40.3  MCV 102.9* 101.3*  PLT 235 249    CMP     Component Value Date/Time   NA 140 08/27/2014 0021   NA 141 08/20/2014 1010   K 3.9 08/27/2014 0021   K 3.5 08/20/2014 1010   CL 103 08/27/2014 0021   CO2 25 08/27/2014 0021   CO2 26 08/20/2014 1010   GLUCOSE 133* 08/27/2014 0021   GLUCOSE 92 08/20/2014 1010   BUN 20 08/27/2014 0021   BUN 13.8 08/20/2014 1010   CREATININE 0.42* 08/27/2014 0021   CREATININE 0.6 08/20/2014 1010   CALCIUM 9.1 08/27/2014 0021   CALCIUM 8.8 08/20/2014 1010   PROT 7.0 08/27/2014 0021   PROT 6.3* 08/20/2014 1010   ALBUMIN 3.5 08/27/2014 0021   ALBUMIN 3.0* 08/20/2014 1010   AST  147* 08/27/2014 0021   AST 63* 08/20/2014 1010   ALT 145* 08/27/2014 0021   ALT 113* 08/20/2014 1010   ALKPHOS 383* 08/27/2014 0021   ALKPHOS 329* 08/20/2014 1010   BILITOT 0.9 08/27/2014 0021   BILITOT 0.42 08/20/2014 1010   GFRNONAA >90 08/27/2014 0021   GFRAA >90 08/27/2014 0021     Recent Labs Lab 08/27/14 0021  LIPASE 44     Recent Labs Lab 08/27/14 0021  TROPONINI <0.03   BNP (last 3 results) No results for input(s): BNP in the last 8760 hours.  ProBNP (last 3 results) No results for input(s): PROBNP in the last 8760 hours.   Radiological Exams on Admission: Dg Chest 2 View  08/27/2014   CLINICAL DATA:  Abdominal pain.  Breast cancer with metastases  EXAM: CHEST  2 VIEW  COMPARISON:  08/25/2014  FINDINGS: Right IJ porta catheter, tip at the SVC level.  Generous heart size but no cardiomegaly when accounting for lower mediastinal fat. There is a small left pleural effusion.  No edema, definitive pneumonia, or pneumothorax.  There are multiple pulmonary metastases, most not visible on this study. Two subtle nodules at the left apex do correlate with metastatic deposits on 08/25/2014.  IMPRESSION: 1. New small left pleural effusion. 2. Pulmonary metastatic disease.   Electronically Signed   By: Monte Fantasia M.D.   On: 08/27/2014 03:44   Ct Chest W Contrast  08/25/2014   CLINICAL DATA:  Followup metastatic right breast carcinoma.  EXAM: CT CHEST WITH CONTRAST  TECHNIQUE: Multidetector CT imaging of the chest was performed during intravenous contrast administration.  CONTRAST:  41mL OMNIPAQUE IOHEXOL 300 MG/ML  SOLN  COMPARISON:  Chest CT on 05/21/2014  FINDINGS: Mediastinum/Lymph Nodes: No mediastinal or hilar lymphadenopathy identified. Prior right mastectomy again noted. Right axillary lymphadenopathy shows minimal decrease since previous study. Index lymph node in on image 18/series 2 now measures 1.4 x 1.7 cm compared to 1.8 x 2.3 cm. Other index node currently measures  2.0 x 2.1 cm on image 13 compared to 2.1 x 2.2 cm previously.  Lungs/Pleura: Bilateral pulmonary metastases are seen with some showing  mild progression in size since previous exam. Two index nodules in the medial left lower lobe currently measure 1.4 x 1.6 cm compared to 0.7 x 1.1 cm previously, and 1.4 x 1.5 cm compared to 1.1 x 1.3 cm previously. Other pulmonary metastases remain stable. No evidence of pulmonary consolidation or pleural effusion.  Musculoskeletal/Soft Tissues: No suspicious bone lesions or other significant chest wall abnormality.  Upper Abdomen: Both adrenal glands remain normal in appearance. An ill-defined hypovascular mass in the central right hepatic lobe is seen which is increased in size and causes peripheral dilatation of intrahepatic bile ducts. This measures approximately 2.5 x 3.0 cm on image 52/series 2 compared to 1.0 cm previously. Other smaller liver metastases show mild decrease in size since prior exam.  IMPRESSION: Mild progression of bilateral pulmonary metastases since prior study.  Slight decrease in right axillary lymphadenopathy compared to prior exam.  Mixed response of liver metastases. While some appear decreased in size, a 3 cm ill-defined lesion in the central right hepatic lobe has increased and is now causing dilatation of peripheral intrahepatic bile ducts.   Electronically Signed   By: Earle Gell M.D.   On: 08/25/2014 17:09   Ct Abdomen Pelvis W Contrast  08/27/2014   CLINICAL DATA:  Abdominal pain for 2 days. History of breast cancer with liver and lung metastases. Undergoing chemotherapy  EXAM: CT ABDOMEN AND PELVIS WITH CONTRAST  TECHNIQUE: Multidetector CT imaging of the abdomen and pelvis was performed using the standard protocol following bolus administration of intravenous contrast.  CONTRAST:  148mL OMNIPAQUE IOHEXOL 300 MG/ML  SOLN  COMPARISON:  08/14/2014  FINDINGS: BODY WALL: Right mastectomy, partly visible.  LOWER CHEST: No contributory findings.   ABDOMEN/PELVIS:  Liver and biliary: Hepatic metastatic disease as recently characterized by MRI. As noted on chest CT 08/25/2014 1 of the metastases in the central right liver causes anterior and posterior segment right liver ductal obstruction. There is also chronic attenuation of a portal branch into the upper right liver. Cholelithiasis. There is no gallbladder distention to suggest obstruction. Prominent enhancement of the gallbladder mucosa is indeterminate.  Pancreas: Unremarkable.  Spleen: Unremarkable.  Adrenals: Unremarkable.  Kidneys and ureters: No hydronephrosis or stone.  Bladder: Unremarkable.  Reproductive: No pathologic findings.  Bowel: Formed stool throughout the colon. No bowel obstruction or inflammatory wall thickening. Negative appendix.  Retroperitoneum: No mass or adenopathy.  Peritoneum: No ascites or pneumoperitoneum.  Vascular: Bilateral common femoral and external iliac DVT, nonobstructive  OSSEOUS: No acute abnormalities.  IMPRESSION: 1. Bilateral DVT with common femoral and external iliac clot. 2. Known malignant obstruction of the right intrahepatic biliary tree. The same metastasis causes chronic portal branch occlusion or stenosis. 3. Cholelithiasis. The gallbladder mucosa is hyperenhancing, there is no distention to suggest obstruction/acute cholecystitis. 4. Probable constipation.   Electronically Signed   By: Monte Fantasia M.D.   On: 08/27/2014 04:54   Assessment/Plan Principal Problem:   DVT (deep venous thrombosis) Active Problems:   Breast cancer of right female breast s/p palliative mastectomy 02/17/14   GERD (gastroesophageal reflux disease)   Focal motor seizure   Lung metastases   Liver metastases   Constipation   Thrush   1. DVT (deep venous thrombosis) The patient is presenting with complaints of abdominal pain. A CT scan of the abdomen was performed which shows cholelithiasis without any evidence of cholecystitis with malignant obstruction of right  intrahepatic biliary tree which is chronic. She also was found to have constipation. Incidentally she was found  to have bilateral DVT involving common few more and external iliac arteries. With such proximal DVTs the patient will be admitted in the hospital for further workup. She is currently on heparin infusion. We'll discuss with radiology prior to ordering CT scan of the chest to rule out PE. We will get lower extremity Doppler. Monitor on telemetry.  2. Metastatic breast cancer. Oncology will be consulted.  3. Brain metastasis. Continue Keppra as well as Decadron.  4. Liver metastasis with an increased LFT. Continue close monitoring and avoid hepatotoxic medications.  5. Chemoprophylaxis. Continue acyclovir as well as fluconazole.  6. Abdominal pain. Issues abdominal pain is likely secondary to chronic liver metastasis. Possibility of constipation causing pain cannot be ruled out as well. I would place her on bowel regimen and continue close monitoring continue home pain management with IV Dilaudid..  Advance goals of care discussion: Full code   Nutrition: Nothing by mouth  Family Communication: family was present at bedside, opportunity was given to ask question and all questions were answered satisfactorily at the time of interview. Disposition: Admitted as inpatient, telemetry unit.  Author: Berle Mull, MD [facility] Pager: [phone] 08/27/2014  If 7PM-7AM, please contact night-coverage [email or web address] Password [email or web address]

## 2014-08-27 NOTE — Progress Notes (Signed)
*  Preliminary Results* Bilateral lower extremity venous duplex completed. Bilateral lower extremities are positive for nonocclusive deep vein thrombosis of varying/indeterminate age involving bilateral saphenofemoral junctions, common femoral, femoral, and popliteal veins as well as the left posterior tibial and peroneal veins . There is no evidence of Baker's cyst bilaterally.  08/27/2014  Maudry Mayhew, RVT, RDCS, RDMS

## 2014-08-28 ENCOUNTER — Ambulatory Visit
Admission: RE | Admit: 2014-08-28 | Payer: BC Managed Care – PPO | Source: Ambulatory Visit | Admitting: Radiation Oncology

## 2014-08-28 LAB — CBC
HCT: 36.5 % (ref 36.0–46.0)
HEMOGLOBIN: 11.5 g/dL — AB (ref 12.0–15.0)
MCH: 32.5 pg (ref 26.0–34.0)
MCHC: 31.5 g/dL (ref 30.0–36.0)
MCV: 103.1 fL — AB (ref 78.0–100.0)
Platelets: 224 10*3/uL (ref 150–400)
RBC: 3.54 MIL/uL — ABNORMAL LOW (ref 3.87–5.11)
RDW: 15.1 % (ref 11.5–15.5)
WBC: 6.8 10*3/uL (ref 4.0–10.5)

## 2014-08-28 MED ORDER — SODIUM CHLORIDE 0.9 % IJ SOLN
10.0000 mL | INTRAMUSCULAR | Status: DC | PRN
  Administered 2014-08-28: 10 mL

## 2014-08-28 MED ORDER — ENOXAPARIN SODIUM 100 MG/ML ~~LOC~~ SOLN: 95.0000 mg | SUBCUTANEOUS | Status: DC

## 2014-08-28 MED ORDER — HEPARIN SOD (PORK) LOCK FLUSH 100 UNIT/ML IV SOLN
500.0000 [IU] | INTRAVENOUS | Status: AC | PRN
  Administered 2014-08-28: 500 [IU]

## 2014-08-28 NOTE — Discharge Summary (Addendum)
Physician Discharge Summary  Carrie Berry FYB:[id number] DOB: January 29, 1951 DOA: 08/26/2014  PCP: [name], MD  Admit date: 08/26/2014 Discharge date: 08/28/2014  Recommendations for Outpatient Follow-up:  1. Patient will continue taking Lovenox and discharge for newly diagnosed DVT.  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 She presented to [facility] ED with abdominal pain in upper abdominal region, associated nausea and vomiting for past few days prior to this admission. Patient also reported worsening leg swelling. On admission, chest x-ray showed pulmonary metastatic disease. CT abdomen revealed bilateral DVT in common femoral and external iliac veins. Lower extremity Doppler confirmed the findings. She was started on Lovenox for DVT.   Assessment/Plan:    Principal Problem: Abdominal pain, nausea and vomiting - Unclear ideology. She feels better this morning. Off note, she did have recent chemotherapy 08/20/2014 which could have contributed to abdominal pain, nausea and vomiting. - She was found to have DVT on this admission. Not sure if this could have contributed to the symptoms above. Anyway she feels better and has tolerated regular diet. - Continue antiemetics as per prior home regimen.   Possibly from recent chemotherapy 08/20/2014. CT abd on  admission showed DVT in femoral and iliac vein. She also has malignant obstruction of the right intrahepatic biliary tree. That could be the cause of abd pain as well. - Continue supportive care with IV fluids - Continue antiemetics as needed and analgesia as needed - Says she feels better this am  Active Problems:  DVT (deep venous thrombosis) - Patient found to have DVT in femoral and iliac veins bilaterally. Findings on CT scan as well as lower extremity Doppler. - Patient is on Lovenox for anticoagulation.   Breast cancer of right female breast s/p palliative mastectomy 02/17/14, pulmonary , hepatic and brain mets  - On chemotherapy, last infusion 08/20/2014 - Under Dr. Virgie Dad care - Continue Keppra and Decadron. - No reports of seizures.   Thrush - Continue acyclovir and fluconazole     DVT Prophylaxis  -- Patient is on Lovenox subcutaneous for DVT treatment.   Code Status: Full.    IV access:  Peripheral IV  Procedures and diagnostic studies:   Dg Chest 2 View 08/27/2014 1. New small left pleural effusion. 2. Pulmonary metastatic disease.   Ct Chest W Contrast 08/25/2014 Mild progression of bilateral pulmonary metastases since prior study. Slight decrease in right axillary lymphadenopathy compared to prior exam. Mixed response of liver metastases. While some appear decreased in size, a 3 cm ill-defined lesion in the central right hepatic lobe has increased and is now causing dilatation of peripheral intrahepatic bile ducts.   Ct Abdomen Pelvis W Contrast 08/27/2014 1. Bilateral DVT with common femoral and external iliac clot. 2. Known malignant obstruction of the right intrahepatic biliary tree. The same metastasis causes chronic portal branch occlusion or stenosis. 3. Cholelithiasis. The gallbladder mucosa is hyperenhancing, there  is no distention to suggest obstruction/acute cholecystitis. 4. Probable constipation.    Medical Consultants:   Oncology, Dr. [name]  Other Consultants:  None   IAnti-Infectives:   Acyclovir Fluconazole     Signed:  [name], MD  [facility] 08/28/2014, 10:24 AM  Pager #: [phone]  Time spent in minutes: less than 30 minutes   Discharge Exam: Filed Vitals:   08/28/14 0446  BP: 130/79  Pulse: 63  Temp: 97.6 F (36.4 C)  Resp: 16   Filed Vitals:   08/27/14 0628 08/27/14 1416 08/27/14 2021 08/28/14 0446  BP: 142/74 113/69 102/66 130/79  Pulse: 74 87 69 63  Temp: 98.6 F (37 C) 97.7 F (36.5 C) 98.4 F (36.9 C) 97.6 F (36.4 C)  TempSrc: Oral Oral Oral Oral  Resp: 17 18 17 16   Height: 5\' 4"  (1.626 m)     Weight: 63.1 kg (139 lb 1.8 oz)   62.9 kg (138 lb 10.7 oz)  SpO2: 99% 98% 96% 98%    General: Pt is alert, follows commands appropriately, not in acute distress Cardiovascular: Regular rate and rhythm, S1/S2 +, no murmurs Respiratory: Clear to auscultation bilaterally, no wheezing, no crackles, no rhonchi Abdominal: Soft, non tender, non distended, bowel sounds +, no guarding Extremities: no edema, no cyanosis, pulses palpable bilaterally DP and PT Neuro: Grossly nonfocal  Discharge Instructions  Discharge Instructions    Call MD for:  difficulty breathing, headache or visual disturbances    Complete by:  As directed      Call MD for:  persistant nausea and vomiting    Complete by:  As directed      Call MD for:  severe uncontrolled pain    Complete by:  As directed      Diet - low sodium heart healthy    Complete by:  As directed      Discharge instructions    Complete by:  As directed   1. Take Lovenox on discharge for DVT.     Increase activity slowly    Complete by:  As directed             Medication List    STOP taking these medications        ondansetron 8 MG tablet  Commonly known as:  ZOFRAN     promethazine 25 MG tablet  Commonly known as:  PHENERGAN      TAKE these medications        acyclovir 400 MG  tablet  Commonly known as:  ZOVIRAX  Take 1 tablet (400 mg total) by mouth 2 (two) times daily.     cetirizine 10 MG tablet  Commonly known as:  ZYRTEC  Take 10 mg by mouth daily.     cyclobenzaprine 5 MG tablet  Commonly known as:  FLEXERIL  Take 1 tablet by mouth 3 (three) times daily as needed. Muscle spasms     dexamethasone 2 MG tablet  Commonly known as:  DECADRON  Take 1 tablet by mouth daily.     diphenoxylate-atropine 2.5-0.025 MG per tablet  Commonly known as:  LOMOTIL  Take 1 tablet by mouth 4 (four) times daily as needed for diarrhea or loose stools.     DSS 100 MG Caps  Take 100 mg by mouth daily.     enoxaparin 100 MG/ML injection  Commonly known as:  LOVENOX  Inject 0.95 mLs (95 mg total) into the skin daily.     fluconazole 100 MG tablet  Commonly known as:  DIFLUCAN  Take 100 mg by mouth daily.     gabapentin 100 MG capsule  Commonly known as:  NEURONTIN  Take 1 capsule (100 mg total) by mouth 3 (three) times daily.     HYDROmorphone 2 MG tablet  Commonly known as:  DILAUDID  Take 0.5 tablets (1 mg total) by mouth every 4 (four) hours as needed.     levETIRAcetam 1000 MG tablet  Commonly known as:  KEPPRA  Take 1 tablet by mouth every 12 (twelve) hours.     lidocaine-prilocaine cream  Commonly known as:  EMLA  Apply to affected area once     methylphenidate 5 MG tablet  Commonly known as:  RITALIN  Take 1 tablet (5 mg total) by mouth 2 (two) times daily with breakfast and lunch.     mirtazapine 30 MG tablet  Commonly known as:  REMERON  Take 0.5 tablets (15 mg total) by mouth at bedtime.     multivitamin with minerals Tabs tablet  Take 1 tablet by mouth daily.     omeprazole 20 MG capsule  Commonly known as:  PRILOSEC  TAKE 1 CAPSULE (20 MG TOTAL) BY MOUTH 2 (TWO) TIMES DAILY BEFORE A MEAL.     prochlorperazine 10 MG tablet  Commonly known as:  COMPAZINE  TAKE 1 TABLET (10 MG TOTAL) BY MOUTH EVERY 6 (SIX) HOURS AS NEEDED (NAUSEA OR  VOMITING).     RA KRILL OIL PO  Take 1 capsule by mouth daily.     simethicone 80 MG chewable tablet  Commonly known as:  MYLICON  Chew 1 tablet (80 mg total) by mouth every 6 (six) hours as needed for flatulence.     traZODone 50 MG tablet  Commonly known as:  DESYREL  TAKE 1 TO 2 TABLETS BY MOUTH AT BEDTIME FOR SLEEP     VITAMIN D PO  Take 800 Units by mouth daily.           Follow-up Information    Follow up with POLITE,RONALD D, MD. Schedule an appointment as soon as possible for a visit in 1 week.   Specialty:  Internal Medicine   Why:  Follow up appt after recent hospitalization   Contact information:   [address] [place] [address] [phone]        The results of significant diagnostics from this hospitalization (including imaging, microbiology, ancillary and laboratory) are listed below for reference.    Significant Diagnostic Studies: Dg Chest 2 View  08/27/2014   CLINICAL DATA:  Abdominal pain.  Breast cancer with metastases  EXAM: CHEST  2 VIEW  COMPARISON:  08/25/2014  FINDINGS: Right IJ porta catheter, tip at the SVC level.  Generous heart size but no cardiomegaly when accounting for lower mediastinal fat. There is a small left pleural effusion.  No edema, definitive pneumonia, or pneumothorax.  There are multiple pulmonary metastases, most not visible on this study. Two subtle nodules at the left apex do correlate with metastatic deposits on 08/25/2014.  IMPRESSION: 1. New small left pleural effusion. 2. Pulmonary metastatic disease.   Electronically Signed   By: Monte Fantasia M.D.   On: 08/27/2014 03:44   Ct Chest W Contrast  08/25/2014   CLINICAL DATA:  Followup metastatic right breast carcinoma.  EXAM: CT CHEST WITH CONTRAST  TECHNIQUE: Multidetector CT imaging of the chest was performed during intravenous contrast administration.  CONTRAST:  60mL OMNIPAQUE IOHEXOL 300 MG/ML  SOLN  COMPARISON:  Chest CT on 05/21/2014  FINDINGS:  Mediastinum/Lymph Nodes: No mediastinal or hilar lymphadenopathy identified. Prior right mastectomy again noted. Right axillary lymphadenopathy shows minimal decrease since previous study. Index lymph node in on image 18/series 2 now measures 1.4 x 1.7 cm compared to 1.8 x 2.3 cm. Other index node currently measures 2.0 x 2.1 cm on image 13 compared to 2.1 x 2.2 cm previously.  Lungs/Pleura: Bilateral pulmonary metastases are seen with some showing mild progression in size since previous exam. Two index nodules in the medial left lower lobe currently measure 1.4 x 1.6 cm compared to 0.7 x 1.1 cm previously, and 1.4 x 1.5 cm compared to 1.1 x 1.3 cm previously. Other pulmonary metastases remain stable. No evidence of pulmonary consolidation or pleural effusion.  Musculoskeletal/Soft Tissues: No suspicious bone lesions or other significant chest wall abnormality.  Upper Abdomen: Both adrenal glands remain normal in appearance. An ill-defined hypovascular mass in the central right hepatic lobe is seen which is increased in size and causes peripheral dilatation of intrahepatic bile ducts. This measures approximately 2.5 x 3.0 cm on image 52/series 2 compared to 1.0 cm previously. Other smaller liver metastases show mild decrease in size since prior exam.  IMPRESSION: Mild progression of bilateral pulmonary metastases since prior study.  Slight decrease in right axillary lymphadenopathy compared to prior exam.  Mixed response of liver metastases. While some appear decreased in size, a 3 cm ill-defined lesion in the central right hepatic lobe has increased and is now causing dilatation of peripheral intrahepatic bile ducts.   Electronically Signed   By: Earle Gell M.D.   On: 08/25/2014 17:09   Nm Cardiac Muga Rest  07/30/2014   CLINICAL DATA:  Systolic chest heart failure. Evaluate cardiac function prior to chemotherapy.  EXAM: NUCLEAR MEDICINE CARDIAC BLOOD POOL IMAGING (MUGA)  TECHNIQUE: Cardiac multi-gated  acquisition was performed at rest following intravenous injection of Tc-47m labeled red blood cells.  RADIOPHARMACEUTICALS:  Twenty-four mCiTc-1m in-vitro labeled red blood cells.  COMPARISON:  None.  FINDINGS: No focal wall motion abnormality of the left ventricle.  Calculated left ventricular ejection fraction equals 63%  IMPRESSION: Left ventricular ejection fraction equals 63%   Electronically Signed   By: Suzy Bouchard M.D.   On: 07/30/2014 13:31   Ct Abdomen Pelvis W Contrast  08/27/2014   CLINICAL DATA:  Abdominal pain for 2 days. History of breast cancer with liver and lung metastases. Undergoing chemotherapy  EXAM: CT ABDOMEN AND PELVIS WITH CONTRAST  TECHNIQUE: Multidetector CT imaging of the abdomen and pelvis was performed using the standard protocol following bolus administration of intravenous contrast.  CONTRAST:  112mL OMNIPAQUE IOHEXOL 300 MG/ML  SOLN  COMPARISON:  08/14/2014  FINDINGS: BODY WALL: Right mastectomy, partly visible.  LOWER CHEST: No contributory findings.  ABDOMEN/PELVIS:  Liver and biliary: Hepatic metastatic disease as recently characterized by MRI. As noted on chest CT 08/25/2014 1 of the metastases in the central right liver causes anterior and posterior segment right liver ductal obstruction. There is also chronic attenuation of a portal branch into the upper right liver. Cholelithiasis. There is no gallbladder distention to suggest obstruction. Prominent enhancement of the gallbladder mucosa is indeterminate.  Pancreas: Unremarkable.  Spleen: Unremarkable.  Adrenals: Unremarkable.  Kidneys and ureters: No hydronephrosis or stone.  Bladder: Unremarkable.  Reproductive: No pathologic findings.  Bowel: Formed stool throughout the colon. No bowel obstruction or inflammatory wall thickening. Negative appendix.  Retroperitoneum: No mass or adenopathy.  Peritoneum: No ascites or pneumoperitoneum.  Vascular: Bilateral common femoral and external iliac DVT, nonobstructive   OSSEOUS: No acute abnormalities.  IMPRESSION: 1. Bilateral DVT with common femoral and external iliac clot. 2. Known malignant obstruction of the right intrahepatic biliary tree. The same metastasis causes chronic portal branch occlusion or stenosis. 3. Cholelithiasis. The gallbladder mucosa is hyperenhancing, there is no distention to suggest obstruction/acute cholecystitis. 4. Probable constipation.   Electronically Signed   By: Monte Fantasia M.D.   On: 08/27/2014 04:54   Mr Liver W Wo Contrast  08/14/2014   CLINICAL DATA:  64 year old female with metastatic breast cancer with elevated liver enzymes. An evaluate for liver metastases.  EXAM: MRI ABDOMEN WITHOUT AND WITH CONTRAST  TECHNIQUE: Multiplanar multisequence MR imaging of the abdomen was performed both before and after the administration of intravenous contrast.  CONTRAST:  5mL MULTIHANCE GADOBENATE DIMEGLUMINE 529 MG/ML IV SOLN  COMPARISON:  MRI of the abdomen 06/03/2014.  FINDINGS: Lower chest: Poorly evaluated secondary to susceptibility artifact from gas in the lungs, however, there are at least 2 large nodules in the left lower lobe, measuring up to 1.6 cm in diameter (image 88 of series 1102), slightly larger than prior chest CT 05/21/2014. Status post right mastectomy.  Hepatobiliary: Multiple hepatic lesions are again noted, however, these generally appear decreased in size and number compared to the prior examination. The largest lesion seen on the prior study in segment 4A of the liver currently measures only 18 mm (image 34 of series 1102), previously 2.3 cm on 06/03/2014. Than new lesions seen on the prior examination in segment 8 of the liver has also decreased in size, currently measuring only 11 mm (image 40 of series 1102), previously 14 mm. Additionally, other lesions have resolved entirely, including a lesion that was previously noted in segment 4A of the liver adjacent to the largest lesion, which is no longer clearly identified  (lesion seen on image 23 of series 1102 of prior examination from 06/03/2014). Several other lesions are also substantially smaller than the prior examination. No definite new hepatic lesions are noted. However, today's study does demonstrate increasing intrahepatic biliary ductal dilatation in the right lobe of the liver, which could be related to obstruction of the bile ducts at the level of the lesion in the central aspect of segment 8 (these image 40 of series 1102), despite its decreasing size. No extrahepatic biliary ductal dilatation. Common bile duct measures 3 mm in the porta hepatis. Small gallstones are noted dependently in the gallbladder. No current findings to suggest acute cholecystitis at this time. No ductal stones identified in the common bile duct.  Pancreas: No pancreatic mass. No pancreatic ductal dilatation. No pancreatic or peripancreatic inflammatory changes or fluid collections.  Spleen: Unremarkable.  Adrenals/Urinary Tract: Normal appearance of the kidneys and adrenal glands bilaterally.  Stomach/Bowel: The appearance of the stomach in visualize small bowel and colon are unremarkable.  Vascular/Lymphatic: No aneurysm identified in the visualized abdominal vasculature. No lymphadenopathy noted in the abdomen.  Other: No significant volume of ascites in the visualized abdomen.  Musculoskeletal: No aggressive osseous lesions noted in the visualized portions of the skeleton.  IMPRESSION: 1. Today's study demonstrates a significant positive response to therapy with decreased size and decreased number of numerous hepatic metastases, as discussed above. 2. There is increasing biliary ductal dilatation in the right lobe of the liver, which appears to be centralized around a regressing lesion in the central aspect of segment 8 of the liver, implying some degree of ductal stenosis/obstruction either related  to the lesion itself, or perilesional edema/inflammatory changes. 3. Pulmonary nodules in the  left lower lobe appear slightly larger than prior chest CT 05/21/2014, although direct comparison between MRI and CT is limited, particularly in the thorax. Correlation with repeat noncontrast chest CT is suggested in the near future if clinically appropriate to re-evaluate the pulmonary metastasis. 4. Cholelithiasis without evidence to suggest acute cholecystitis at this time.   Electronically Signed   By: Vinnie Langton M.D.   On: 08/14/2014 16:41    Microbiology: No results found for this or any previous visit (from the past 240 hour(s)).   Labs: Basic Metabolic Panel:  Recent Labs Lab 08/27/14 0021 08/27/14 0840  NA 140 138  K 3.9 3.8  CL 103 104  CO2 25 28  GLUCOSE 133* 85  BUN 20 14  CREATININE 0.42* 0.35*  CALCIUM 9.1 8.6   Liver Function Tests:  Recent Labs Lab 08/27/14 0021 08/27/14 0840  AST 147* 408*  ALT 145* 326*  ALKPHOS 383* 347*  BILITOT 0.9 1.9*  PROT 7.0 6.0  ALBUMIN 3.5 2.8*    Recent Labs Lab 08/27/14 0021  LIPASE 44   No results for input(s): AMMONIA in the last 168 hours. CBC:  Recent Labs Lab 08/27/14 0021 08/27/14 0840 08/28/14 0520  WBC 9.4 8.9 6.8  NEUTROABS 8.1* 7.6  --   HGB 13.0 11.5* 11.5*  HCT 40.3 35.7* 36.5  MCV 101.3* 100.8* 103.1*  PLT 249 192 224   Cardiac Enzymes:  Recent Labs Lab 08/27/14 0021 08/27/14 0840  TROPONINI <0.03 <0.03   BNP: BNP (last 3 results) No results for input(s): BNP in the last 8760 hours.  ProBNP (last 3 results) No results for input(s): PROBNP in the last 8760 hours.  CBG: No results for input(s): GLUCAP in the last 168 hours.  Time coordinating discharge: Over 30 minutes

## 2014-08-28 NOTE — Discharge Instructions (Signed)

## 2014-09-03 ENCOUNTER — Other Ambulatory Visit: Payer: Self-pay | Admitting: Nurse Practitioner

## 2014-09-04 ENCOUNTER — Encounter: Payer: Self-pay | Admitting: Radiation Oncology

## 2014-09-04 ENCOUNTER — Ambulatory Visit
Admission: RE | Admit: 2014-09-04 | Discharge: 2014-09-04 | Disposition: A | Discharge status: Home / Self Care | Payer: BC Managed Care – PPO | Source: Ambulatory Visit | Attending: Radiation Oncology | Admitting: Radiation Oncology

## 2014-09-04 VITALS — BP 113/66 | HR 93 | Temp 99.1°F | Resp 16

## 2014-09-04 DIAGNOSIS — C50911 Malignant neoplasm of unspecified site of right female breast: Secondary | ICD-10-CM

## 2014-09-04 NOTE — Progress Notes (Signed)
   Department of Radiation Oncology  Phone:  [phone] Fax:        [phone]   Name: Carrie Berry MRN: [id number]  DOB: June 05, 1950  Date: 09/04/2014  Follow Up Visit Note  Diagnosis: Breast cancer of right female breast s/p palliative mastectomy 02/17/14   Staging form: Breast, AJCC 7th Edition     Clinical: Stage IV (T2, N1, M1) - Unsigned       Staging comments: Staged at breast conference 01/01/13  Summary and Interval since last radiation: 6 weeks from radiation of the right chest wall to 20 Gy in 5 fractions completed 07/22/14  Interval History: Carrie Berry presents today for routine followup.  She is feeling well other than nausea and some constipation. She was hospitalized for abdominal pain and was incidentally found to have lower extremity blood clots so she is on anticoagulation. She continues to have nightly abdominal pain for which she takes a pain pill and feels better. She does not want to take a pain pill each night for fears of constipation. I reviewed the CT and MRI performed while she was an inpatient with radiology and she has som right hepatic duct dilatation without an obvious mass. Interventinal radiology and GI did not feel intervention was appropraite at this time given a lack of lesion as well as the relatively normal bilirubin. Her right axillary and SLCV lymph nodes are stable and not causing her any pain.  She is accompanied by her husband.   Physical Exam:  Filed Vitals:   09/04/14 1659  BP: 113/66  Pulse: 93  Temp: 99.1 F (37.3 C)  TempSrc: Oral  Resp: 86   Pleasant female in no distress. Treated are has responded well to treatment and is flat.  She has axillary and supraclavicular adenopathy which is stable.   IMPRESSION: Carrie Berry is a 64 y.o. female s/p palliative radiation to the right chest wall with good response  PLAN:  I spoke to [place] and her husband. We discussed her abdominal scans. There really isn't a good target in her liver for me to treat to relieve  her abdominal symptoms.  We discussed taking a stool softener and then scheduling her pain medication so she didn't wake up in pain. She has follow up scheduled with Dr. [name]. I will see her back prn.     Thea Silversmith, MD

## 2014-09-14 ENCOUNTER — Other Ambulatory Visit: Payer: Self-pay

## 2014-09-14 MED ORDER — TRAZODONE HCL 50 MG PO TABS: ORAL_TABLET | ORAL | Status: DC

## 2014-09-17 ENCOUNTER — Other Ambulatory Visit (HOSPITAL_BASED_OUTPATIENT_CLINIC_OR_DEPARTMENT_OTHER): Payer: BC Managed Care – PPO

## 2014-09-17 ENCOUNTER — Ambulatory Visit (HOSPITAL_BASED_OUTPATIENT_CLINIC_OR_DEPARTMENT_OTHER): Payer: BC Managed Care – PPO | Admitting: Nurse Practitioner

## 2014-09-17 ENCOUNTER — Other Ambulatory Visit: Payer: BC Managed Care – PPO

## 2014-09-17 ENCOUNTER — Encounter: Payer: Self-pay | Admitting: Nurse Practitioner

## 2014-09-17 ENCOUNTER — Ambulatory Visit (HOSPITAL_BASED_OUTPATIENT_CLINIC_OR_DEPARTMENT_OTHER): Payer: BC Managed Care – PPO

## 2014-09-17 ENCOUNTER — Ambulatory Visit: Payer: BC Managed Care – PPO

## 2014-09-17 VITALS — BP 110/71 | HR 96 | Temp 98.5°F | Resp 18 | Ht 64.0 in | Wt 139.5 lb

## 2014-09-17 DIAGNOSIS — C50911 Malignant neoplasm of unspecified site of right female breast: Secondary | ICD-10-CM

## 2014-09-17 DIAGNOSIS — C50411 Malignant neoplasm of upper-outer quadrant of right female breast: Secondary | ICD-10-CM | POA: Diagnosis not present

## 2014-09-17 DIAGNOSIS — C7931 Secondary malignant neoplasm of brain: Secondary | ICD-10-CM | POA: Diagnosis not present

## 2014-09-17 DIAGNOSIS — C787 Secondary malignant neoplasm of liver and intrahepatic bile duct: Secondary | ICD-10-CM

## 2014-09-17 DIAGNOSIS — C773 Secondary and unspecified malignant neoplasm of axilla and upper limb lymph nodes: Secondary | ICD-10-CM

## 2014-09-17 DIAGNOSIS — C50919 Malignant neoplasm of unspecified site of unspecified female breast: Secondary | ICD-10-CM

## 2014-09-17 DIAGNOSIS — I82403 Acute embolism and thrombosis of unspecified deep veins of lower extremity, bilateral: Secondary | ICD-10-CM

## 2014-09-17 DIAGNOSIS — C7801 Secondary malignant neoplasm of right lung: Secondary | ICD-10-CM

## 2014-09-17 DIAGNOSIS — C7800 Secondary malignant neoplasm of unspecified lung: Secondary | ICD-10-CM

## 2014-09-17 DIAGNOSIS — Z5111 Encounter for antineoplastic chemotherapy: Secondary | ICD-10-CM | POA: Diagnosis not present

## 2014-09-17 DIAGNOSIS — E46 Unspecified protein-calorie malnutrition: Secondary | ICD-10-CM

## 2014-09-17 DIAGNOSIS — Z95828 Presence of other vascular implants and grafts: Secondary | ICD-10-CM

## 2014-09-17 LAB — CBC WITH DIFFERENTIAL/PLATELET
BASO%: 0.6 % (ref 0.0–2.0)
Basophils Absolute: 0 10*3/uL (ref 0.0–0.1)
EOS%: 0.1 % (ref 0.0–7.0)
Eosinophils Absolute: 0 10*3/uL (ref 0.0–0.5)
HEMATOCRIT: 38.5 % (ref 34.8–46.6)
HGB: 12.7 g/dL (ref 11.6–15.9)
LYMPH%: 17.3 % (ref 14.0–49.7)
MCH: 32.5 pg (ref 25.1–34.0)
MCHC: 33 g/dL (ref 31.5–36.0)
MCV: 98.5 fL (ref 79.5–101.0)
MONO#: 1.2 10*3/uL — AB (ref 0.1–0.9)
MONO%: 17 % — ABNORMAL HIGH (ref 0.0–14.0)
NEUT%: 65 % (ref 38.4–76.8)
NEUTROS ABS: 4.6 10*3/uL (ref 1.5–6.5)
PLATELETS: 226 10*3/uL (ref 145–400)
RBC: 3.91 10*6/uL (ref 3.70–5.45)
RDW: 15.5 % — ABNORMAL HIGH (ref 11.2–14.5)
WBC: 7.1 10*3/uL (ref 3.9–10.3)
lymph#: 1.2 10*3/uL (ref 0.9–3.3)

## 2014-09-17 LAB — COMPREHENSIVE METABOLIC PANEL (CC13)
ALBUMIN: 3 g/dL — AB (ref 3.5–5.0)
ALK PHOS: 344 U/L — AB (ref 40–150)
ALT: 105 U/L — AB (ref 0–55)
AST: 69 U/L — AB (ref 5–34)
Anion Gap: 15 mEq/L — ABNORMAL HIGH (ref 3–11)
BUN: 12.1 mg/dL (ref 7.0–26.0)
CALCIUM: 9 mg/dL (ref 8.4–10.4)
CHLORIDE: 105 meq/L (ref 98–109)
CO2: 22 mEq/L (ref 22–29)
CREATININE: 0.6 mg/dL (ref 0.6–1.1)
EGFR: >90 mL/min/{1.73_m2} (ref >90)
GLUCOSE: 93 mg/dL (ref 70–140)
Potassium: 3.6 mEq/L (ref 3.5–5.1)
SODIUM: 142 meq/L (ref 136–145)
TOTAL PROTEIN: 6.3 g/dL — AB (ref 6.4–8.3)
Total Bilirubin: 0.39 mg/dL (ref 0.20–1.20)

## 2014-09-17 LAB — TECHNOLOGIST REVIEW

## 2014-09-17 MED ORDER — DOXORUBICIN HCL LIPOSOMAL CHEMO INJECTION 2 MG/ML
43.0000 mg/m2 | Freq: Once | INTRAVENOUS | Status: AC
  Administered 2014-09-17: 70 mg via INTRAVENOUS
  Filled 2014-09-17: qty 35

## 2014-09-17 MED ORDER — TRAZODONE HCL 50 MG PO TABS: ORAL_TABLET | ORAL | Status: DC

## 2014-09-17 MED ORDER — HEPARIN SOD (PORK) LOCK FLUSH 100 UNIT/ML IV SOLN
500.0000 [IU] | Freq: Once | INTRAVENOUS | Status: AC | PRN
  Administered 2014-09-17: 500 [IU]
  Filled 2014-09-17: qty 5

## 2014-09-17 MED ORDER — SODIUM CHLORIDE 0.9 % IJ SOLN
10.0000 mL | INTRAMUSCULAR | Status: DC | PRN
  Filled 2014-09-17: qty 10

## 2014-09-17 MED ORDER — SODIUM CHLORIDE 0.9 % IV SOLN
Freq: Once | INTRAVENOUS | Status: AC
  Administered 2014-09-17: 13:00:00 via INTRAVENOUS
  Filled 2014-09-17: qty 4

## 2014-09-17 MED ORDER — METHYLPHENIDATE HCL 5 MG PO TABS: 5.0000 mg | ORAL_TABLET | Freq: Two times a day (BID) | ORAL | Status: DC

## 2014-09-17 MED ORDER — SODIUM CHLORIDE 0.9 % IV SOLN
Freq: Once | INTRAVENOUS | Status: AC
  Administered 2014-09-17: 13:00:00 via INTRAVENOUS

## 2014-09-17 MED ORDER — SODIUM CHLORIDE 0.9 % IJ SOLN
10.0000 mL | INTRAMUSCULAR | Status: DC | PRN
  Administered 2014-09-17 (×2): 10 mL via INTRAVENOUS
  Filled 2014-09-17: qty 10

## 2014-09-17 NOTE — Patient Instructions (Signed)

## 2014-09-17 NOTE — Patient Instructions (Signed)
[facility] Discharge Instructions for Patients Receiving Chemotherapy  Today you received the following chemotherapy agents Doxil.  To help prevent nausea and vomiting after your treatment, we encourage you to take your nausea medication as directed.   If you develop nausea and vomiting that is not controlled by your nausea medication, call the clinic.   BELOW ARE SYMPTOMS THAT SHOULD BE REPORTED IMMEDIATELY:  *FEVER GREATER THAN 100.5 F  *CHILLS WITH OR WITHOUT FEVER  NAUSEA AND VOMITING THAT IS NOT CONTROLLED WITH YOUR NAUSEA MEDICATION  *UNUSUAL SHORTNESS OF BREATH  *UNUSUAL BRUISING OR BLEEDING  TENDERNESS IN MOUTH AND THROAT WITH OR WITHOUT PRESENCE OF ULCERS  *URINARY PROBLEMS  *BOWEL PROBLEMS  UNUSUAL RASH Items with * indicate a potential emergency and should be followed up as soon as possible.  Feel free to call the clinic you have any questions or concerns. The clinic phone number is [phone].

## 2014-09-17 NOTE — Progress Notes (Signed)
Ok to treat with elevated liver enzymes.

## 2014-09-17 NOTE — Progress Notes (Signed)
. Patient ID: Carrie Berry, female   DOB: 11/26/1950, 64 y.o.   MRN: [id number] ID: [name] OB: 1950/10/24  MR#: [id number]  CSN#:[id number]  PCP: [name], MD GYN:   SU: Carrie Berry OTHER MD: Thea Silversmith, Earle Gell, Christene Slates, Heath Gold  CHIEF COMPLAINT:  Metastatic Breast Cancer CURRENT TREATMENT: Doxil  BREAST CANCER HISTORY: From the originally intake note:  Claudina noted a mass in her right breast December of 2013, but did not think much of it. It did grow some lower the summer, but she was keeping her grandchild at that time and was too busy so she did not bring it to Dr. Bernell List attention until August. He set her up for bilateral mammography and ultrasonography at [facility] 12/18/2012 and this measured, on the right, a large irregular lobulated mass in the upper outer quadrant which by ultrasound measured 5.7 cm. The right axilla showed some lymph nodes with thickened cortices. In the left breast there was a focal asymmetry at the depth, lateral to the nipple, and by ultrasound there was a 9 mm ill-defined hypoechoic lesion in this location. Biopsy of the left breast lesion showed only fibrocystic changes.  Biopsy of the right breast mass and right axillary adenopathy ([id number]) on 12/23/2012 showed both to be involved by invasive ductal carcinoma, grade 2, triple negative, with an MIB-1 of 86%.  MRI of the breast 12/27/2012 at [facility] imaging showed in the right breast a mass abutting the pectoralis muscle without enhancement of the muscle measuring 4.8 cm. The satellite nodule measuring approximately 3 mm was also noted superior to the mass and several abnormal and enlarged right axillary lymph nodes were noted, one of which appeared to be necrotic. The largest node measured 2.0 cm. Unfortunately, numerous bilateral pulmonary nodules were also noted.  The patient's subsequent history is as detailed below   INTERVAL HISTORY: Carrie Berry returns today  for follow-up of her metastatic breast cancer, accompanied by her husband Carrie Berry. Today is day 1 cycle 4 of liposomal doxorubicin, which she receives every 28 days. She has admitted to the hospital on 4/14 and released the next day. She presented with acute right abdominal pain. The CT of the abdomen taken showed obstruction of the right intrahepatic bile ducts as the likely source of pain. Incidental DVTs were found in the common femoral and external iliac veins. She was discharged with lovenox injections, which she administers herself. She visited with Dr. Pablo Ledger last week to discuss if radiation could be helpful to the site causing her abdominal pain. Dr. Pablo Ledger believes that there is not a suitable target for her to treat, so this avenue will not be pursued.   REVIEW OF SYSTEMS Calina denies fevers, chills, nausea, or vomiting. She continues on dilaudid for pain, but does not even have to use this daily. Her abdominal pain can arrive as sharp attacks and then resolves just as quickly. She has had no issues with constipation since taking her stool softeners daily. She denies shortness of breath, chest pain, cough, or palpitations. Her appetite is good. She sleeps fairly well at night with trazodone. She has occasional headaches, but denies dizziness, vision changes, or weakness. A detailed review of systems is otherwise stable.   PAST MEDICAL HISTORY: Past Medical History  Diagnosis Date  . Recurrent sinus infections   . Arthritis   . PONV (postoperative nausea and vomiting)   . GERD (gastroesophageal reflux disease)   . 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  Anxiety     no problems at present  . rt breast ca dx'd 12/2011    breast  . Breast cancer   . 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 Radiation 07/16/14-07/22/14    Right chest wall nodule 20 Gy in 5 fractions    PAST SURGICAL HISTORY: Past Surgical History  Procedure Laterality Date  .  Tubal ligation    . Tubal ligation  1985  . Portacath placement N/A 01/10/2013    Procedure: ULTRASOUND GUIDED PORT-A-CATH INSERTION WITH FLUOROSCOPY;  Surgeon: [name], MD;  Location: [facility];  Service: General;  Laterality: N/A;  . Esophagogastroduodenoscopy N/A 08/19/2013    Procedure: ESOPHAGOGASTRODUODENOSCOPY (EGD);  Surgeon: Garlan Fair, MD;  Location: [facility];  Service: Endoscopy;  Laterality: N/A;  . Total mastectomy Right 02/17/2014    Procedure: PALLIATIVE  RIGHT MASTECTOMY;  Surgeon: Carrie Confer, MD;  Location: [facility];  Service: General;  Laterality: Right;    FAMILY HISTORY Family History  Problem Relation Age of Onset  . Bladder Cancer Father   . Colon cancer Maternal Grandmother    the patient's parents are living, both in their 35s. The patient's father was diagnosed with bladder cancer the age of 61. The patient's mother's mother had some type of gastrointestinal cancer. The patient had one brother, no sisters. There is no history of breast or ovarian cancer in the family other than a cousin on the father's side who was diagnosed with breast cancer apparently before the age of 47.  GYNECOLOGIC HISTORY:   (Reviewed 10/13/2013) Menarche age 62, first live birth age 76, the patient is [place] P2. She went through menopause in 2008. She did not take hormone replacement. She took birth control for approximately 22 years remotely.  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 She's currently on disability. Her husband Carrie Berry"), is vice Development worker, international aid for [employer]. The patient's daughter Carrie Berry is a stay-at-home mom in [place], the patient's son Carrie Berry unfortunately died in an automobile accident in December of 2011. The patient has 2 grandchildren. She attends to a local [employer].    ADVANCED DIRECTIVES: Not in  place   HEALTH MAINTENANCE: (Updated 10/13/2013) History  Substance Use Topics  . Smoking status: Never Smoker   . Smokeless tobacco: Never Used  . Alcohol Use: No     Colonoscopy: 2005  PAP: Not on file  Bone density: Not on file  Lipid panel:  Not on file/Dr. Polite   Allergies  Allergen Reactions  . Codeine Nausea And Vomiting    "violently ill"  . Penicillins Rash    Childhood reaction -    Current Outpatient Prescriptions  Medication Sig Dispense Refill  . acyclovir (ZOVIRAX) 400 MG tablet Take 1 tablet (400 mg total) by mouth 2 (two) times daily. 60 tablet 6  . cetirizine (ZYRTEC) 10 MG tablet Take 10 mg by mouth daily.    . Cholecalciferol (VITAMIN D PO) Take 800 Units by mouth daily.    . [employer] GAS RELIEF 80 MG chewable tablet CHEW 1 TABLET BY MOUTH EVERY 6 (SIX) HOURS AS NEEDED FOR FLATULENCE. 30 tablet 0  . cyclobenzaprine (FLEXERIL) 5 MG tablet Take 1 tablet by mouth 3 (three) times daily as needed. Muscle spasms  0  . diphenoxylate-atropine (LOMOTIL) 2.5-0.025 MG per tablet Take 1 tablet by mouth 4 (four) times daily  as needed for diarrhea or loose stools. 30 tablet 0  . docusate sodium 100 MG CAPS Take 100 mg by mouth daily. (Patient taking differently: Take 200 mg by mouth daily. ) 10 capsule 0  . enoxaparin (LOVENOX) 100 MG/ML injection Inject 0.95 mLs (95 mg total) into the skin daily. 30 Syringe 0  . fluconazole (DIFLUCAN) 100 MG tablet Take 100 mg by mouth daily.    Marland Kitchen gabapentin (NEURONTIN) 100 MG capsule Take 1 capsule (100 mg total) by mouth 3 (three) times daily. 90 capsule 2  . HYDROmorphone (DILAUDID) 2 MG tablet Take 0.5 tablets (1 mg total) by mouth every 4 (four) hours as needed. 10 tablet 0  . levETIRAcetam (KEPPRA) 1000 MG tablet Take 1 tablet by mouth every 12 (twelve) hours.  4  . lidocaine-prilocaine (EMLA) cream Apply to affected area once 30 g 3  . methylphenidate (RITALIN) 5 MG tablet Take 1 tablet (5 mg total) by mouth 2 (two) times daily with  breakfast and lunch. 60 tablet 0  . mirtazapine (REMERON) 30 MG tablet Take 0.5 tablets (15 mg total) by mouth at bedtime. 30 tablet 1  . Multiple Vitamin (MULTIVITAMIN WITH MINERALS) TABS tablet Take 1 tablet by mouth daily.    Marland Kitchen omeprazole (PRILOSEC) 20 MG capsule TAKE 1 CAPSULE (20 MG TOTAL) BY MOUTH 2 (TWO) TIMES DAILY BEFORE A MEAL. 60 capsule 6  . prochlorperazine (COMPAZINE) 10 MG tablet TAKE 1 TABLET (10 MG TOTAL) BY MOUTH EVERY 6 (SIX) HOURS AS NEEDED (NAUSEA OR VOMITING). 30 tablet 1  . RA KRILL OIL PO Take 1 capsule by mouth daily.    . traZODone (DESYREL) 50 MG tablet TAKE 1 TO 2 TABLETS BY MOUTH AT BEDTIME FOR SLEEP 60 tablet 0  . dexamethasone (DECADRON) 2 MG tablet Take 1 tablet by mouth daily.  4   No current facility-administered medications for this visit.   Facility-Administered Medications Ordered in Other Visits  Medication Dose Route Frequency Provider Last Rate Last Dose  . sodium chloride 0.9 % injection 10 mL  10 mL Intravenous PRN Chauncey Cruel, MD   10 mL at 09/17/14 1046    OBJECTIVE: Middle-aged white woman in no acute distress Filed Vitals:   09/17/14 1133  BP: 110/71  Pulse: 96  Temp: 98.5 F (36.9 C)  Resp: 18     Body mass index is 23.93 kg/(m^2).    ECOG FS: 2 Filed Weights   09/17/14 1133  Weight: 139 lb 8 oz (63.277 kg)   Skin: warm, dry, bruising to several injection sits along abdomen.  HEENT: sclerae anicteric, conjunctivae pink, oropharynx clear. No thrush or mucositis.  Lymph Nodes: No cervical or supraclavicular lymphadenopathy  Lungs: clear to auscultation bilaterally, no rales, wheezes, or rhonci  Heart: regular rate and rhythm  Abdomen: distended, soft, non tender, positive bowel sounds  Musculoskeletal: No focal spinal tenderness, no peripheral edema  Neuro: non focal, well oriented, positive affect  Breasts: deferred  LAB RESULTS:   Lab Results  Component Value Date   WBC 7.1 09/17/2014   NEUTROABS 4.6 09/17/2014   HGB  12.7 09/17/2014   HCT 38.5 09/17/2014   MCV 98.5 09/17/2014   PLT 226 09/17/2014      Chemistry      Component Value Date/Time   NA 142 09/17/2014 1025   NA 138 08/27/2014 0840   K 3.6 09/17/2014 1025   K 3.8 08/27/2014 0840   CL 104 08/27/2014 0840   CO2 22 09/17/2014 1025  CO2 28 08/27/2014 0840   BUN 12.1 09/17/2014 1025   BUN 14 08/27/2014 0840   CREATININE 0.6 09/17/2014 1025   CREATININE 0.35* 08/27/2014 0840      Component Value Date/Time   CALCIUM 9.0 09/17/2014 1025   CALCIUM 8.6 08/27/2014 0840   ALKPHOS 344* 09/17/2014 1025   ALKPHOS 347* 08/27/2014 0840   AST 69* 09/17/2014 1025   AST 408* 08/27/2014 0840   ALT 105* 09/17/2014 1025   ALT 326* 08/27/2014 0840   BILITOT 0.39 09/17/2014 1025   BILITOT 1.9* 08/27/2014 0840        STUDIES: Dg Chest 2 View  08/27/2014   CLINICAL DATA:  Abdominal pain.  Breast cancer with metastases  EXAM: CHEST  2 VIEW  COMPARISON:  08/25/2014  FINDINGS: Right IJ porta catheter, tip at the SVC level.  Generous heart size but no cardiomegaly when accounting for lower mediastinal fat. There is a small left pleural effusion.  No edema, definitive pneumonia, or pneumothorax.  There are multiple pulmonary metastases, most not visible on this study. Two subtle nodules at the left apex do correlate with metastatic deposits on 08/25/2014.  IMPRESSION: 1. New small left pleural effusion. 2. Pulmonary metastatic disease.   Electronically Signed   By: Monte Fantasia M.D.   On: 08/27/2014 03:44   Ct Chest W Contrast  08/25/2014   CLINICAL DATA:  Followup metastatic right breast carcinoma.  EXAM: CT CHEST WITH CONTRAST  TECHNIQUE: Multidetector CT imaging of the chest was performed during intravenous contrast administration.  CONTRAST:  2mL OMNIPAQUE IOHEXOL 300 MG/ML  SOLN  COMPARISON:  Chest CT on 05/21/2014  FINDINGS: Mediastinum/Lymph Nodes: No mediastinal or hilar lymphadenopathy identified. Prior right mastectomy again noted. Right  axillary lymphadenopathy shows minimal decrease since previous study. Index lymph node in on image 18/series 2 now measures 1.4 x 1.7 cm compared to 1.8 x 2.3 cm. Other index node currently measures 2.0 x 2.1 cm on image 13 compared to 2.1 x 2.2 cm previously.  Lungs/Pleura: Bilateral pulmonary metastases are seen with some showing mild progression in size since previous exam. Two index nodules in the medial left lower lobe currently measure 1.4 x 1.6 cm compared to 0.7 x 1.1 cm previously, and 1.4 x 1.5 cm compared to 1.1 x 1.3 cm previously. Other pulmonary metastases remain stable. No evidence of pulmonary consolidation or pleural effusion.  Musculoskeletal/Soft Tissues: No suspicious bone lesions or other significant chest wall abnormality.  Upper Abdomen: Both adrenal glands remain normal in appearance. An ill-defined hypovascular mass in the central right hepatic lobe is seen which is increased in size and causes peripheral dilatation of intrahepatic bile ducts. This measures approximately 2.5 x 3.0 cm on image 52/series 2 compared to 1.0 cm previously. Other smaller liver metastases show mild decrease in size since prior exam.  IMPRESSION: Mild progression of bilateral pulmonary metastases since prior study.  Slight decrease in right axillary lymphadenopathy compared to prior exam.  Mixed response of liver metastases. While some appear decreased in size, a 3 cm ill-defined lesion in the central right hepatic lobe has increased and is now causing dilatation of peripheral intrahepatic bile ducts.   Electronically Signed   By: Earle Gell M.D.   On: 08/25/2014 17:09   Ct Abdomen Pelvis W Contrast  08/27/2014   CLINICAL DATA:  Abdominal pain for 2 days. History of breast cancer with liver and lung metastases. Undergoing chemotherapy  EXAM: CT ABDOMEN AND PELVIS WITH CONTRAST  TECHNIQUE: Multidetector CT imaging of  the abdomen and pelvis was performed using the standard protocol following bolus administration  of intravenous contrast.  CONTRAST:  1100mL OMNIPAQUE IOHEXOL 300 MG/ML  SOLN  COMPARISON:  08/14/2014  FINDINGS: BODY WALL: Right mastectomy, partly visible.  LOWER CHEST: No contributory findings.  ABDOMEN/PELVIS:  Liver and biliary: Hepatic metastatic disease as recently characterized by MRI. As noted on chest CT 08/25/2014 1 of the metastases in the central right liver causes anterior and posterior segment right liver ductal obstruction. There is also chronic attenuation of a portal branch into the upper right liver. Cholelithiasis. There is no gallbladder distention to suggest obstruction. Prominent enhancement of the gallbladder mucosa is indeterminate.  Pancreas: Unremarkable.  Spleen: Unremarkable.  Adrenals: Unremarkable.  Kidneys and ureters: No hydronephrosis or stone.  Bladder: Unremarkable.  Reproductive: No pathologic findings.  Bowel: Formed stool throughout the colon. No bowel obstruction or inflammatory wall thickening. Negative appendix.  Retroperitoneum: No mass or adenopathy.  Peritoneum: No ascites or pneumoperitoneum.  Vascular: Bilateral common femoral and external iliac DVT, nonobstructive  OSSEOUS: No acute abnormalities.  IMPRESSION: 1. Bilateral DVT with common femoral and external iliac clot. 2. Known malignant obstruction of the right intrahepatic biliary tree. The same metastasis causes chronic portal branch occlusion or stenosis. 3. Cholelithiasis. The gallbladder mucosa is hyperenhancing, there is no distention to suggest obstruction/acute cholecystitis. 4. Probable constipation.   Electronically Signed   By: Monte Fantasia M.D.   On: 08/27/2014 04:54   CLINICAL DATA: Systolic chest heart failure. Evaluate cardiac function prior to chemotherapy.  EXAM: NUCLEAR MEDICINE CARDIAC BLOOD POOL IMAGING (MUGA)  TECHNIQUE: Cardiac multi-gated acquisition was performed at rest following intravenous injection of Tc-7m labeled red blood cells.  RADIOPHARMACEUTICALS: Twenty-four  mCiTc-33m in-vitro labeled red blood cells.  COMPARISON: None.  FINDINGS: No focal wall motion abnormality of the left ventricle.  Calculated left ventricular ejection fraction equals 63%  IMPRESSION: Left ventricular ejection fraction equals 63%    ASSESSMENT: 64 y.o. Carrie Berry, [place] woman status post right breast upper outer quadrant and right axillary lymph node biopsy 12/23/2012 for a clinical T3 N1 M1, stage IV invasive ductal carcinoma, grade 3, triple negative, with an MIB-1 of 86%.  (1) right liver lobe biopsy 01/21/2013 confirms metastatic adenocarcinoma; scans show involvement of the liver, lungs, and likely bone.  (2) enrolled in [facility] study [id number] (docetaxel + oral gamma secretase inibitor [id number]), received one cycle starting 02/04/2013  but withdrew because of poor tolerance despite treatment interruptions and decreased dosing of the oral component  (3)  chest CT in early November 2014 was compared with studies in August 2014 and showed interval progression of metastatic breast cancer, with an enlarging primary right breast mass, enlarging right axillary lymph nodes, enlarging pulmonary nodules and new/enlarging hepatic metastases.  (4) Abraxane started 03/19/2013, given day 1 and day 8 of each 21 day cycle, stopped with the day 1 cycle 3 dose (04/29/2013) because of local progression of disease  (5) cyclophosphamide and doxorubicin started 05/20/2013, given in dose dense fashion  with Neulasta support on day 2. Completed 4 cycles 07/03/2013, with the final cycle dose reduced 15% because of an episode of febrile neutropenia after cycle 3. Restaging studies 07/15/2013 showed partial response.  (6) started capecitabine April 2015, 1.5 g by mouth twice a day, 7 days on 7 days off--discontinued October 2015 with progression  (7) s/p Right simple mastectomy 02/17/2014 to optimize local control, the pathology ([id number]) showing an invasive ductal carcinoma measuring 4.8  cm, grade 3, with close but  negative margins and evidence of treatment response   (a) nodule on right mastectomy scar noted 04/03/2014, removed 04/14/2014  (b) new nodule noted on R chest scar 05/19/2014  (c) radiation to R chest wall completed 07/22/2014  (8) multiple brain metastases noted on brain MRI 02/26/2014--s/p whole brain irradiation completed 03/17/2014  (a) repeat brain MRI 05/12/2014 shows favorable response  (b) repeat brain MRI 07/15/2014 shows questionable growth/ stable disease  (9) protein-calorie malnutrition  (10) high fall risk  (11) advanced directives: not in place; husband is healthcare power of attorney  (12) unexplained paroxysmal knee pain: possible ruptured Baker's cyst, Left, 05/01/2014  (13) doxil started 05/28/2014, repeated every 28 days. Cycle 3 delayed 2 weeks because of decrease in ejection fraction, which proved spurious (MUGA results 63% EF). Doxil esumed 04//11/2014 with evidence of response   PLAN: Pailynn has recovered well since her admission. The labs were reviewed in detail and her bilirubin, AST, and ALT have all improved over the past 3 weeks. She will proceed with cycle 4 as planned today.   I have refilled her trazodone and ritalin as requested. She will continue her lovenox injections. She was educated to "pinch" a bit more of her stomach fat while administering the injection to reduce the bruising.  Lagina will return in 4 weeks for labs, a follow up visit, and cycle 5 of treatment. She understands and agrees with this plan. She knows the goal of treatment in her case is control. She has been encouraged to call with any issues that might arise before her next visit here.    Laurie Panda, NP   09/17/2014 12:21 PM                                                                                                                                                                                                                                                                                                                                                                                                                                                                                                                                                                                                                                                                                                                                                                                                                                                                                                                                                                                                                                                                                                                                                                                                                                                                                                                                                                                                                                                                                                                                                                                                                                                                                                                                                                    `

## 2014-09-18 ENCOUNTER — Other Ambulatory Visit: Payer: Self-pay | Admitting: *Deleted

## 2014-09-21 ENCOUNTER — Other Ambulatory Visit: Payer: Self-pay | Admitting: Oncology

## 2014-09-25 ENCOUNTER — Other Ambulatory Visit: Payer: Self-pay | Admitting: Nurse Practitioner

## 2014-10-08 ENCOUNTER — Other Ambulatory Visit: Payer: Self-pay | Admitting: Oncology

## 2014-10-11 ENCOUNTER — Other Ambulatory Visit: Payer: Self-pay | Admitting: Oncology

## 2014-10-12 ENCOUNTER — Encounter (HOSPITAL_COMMUNITY): Payer: Self-pay

## 2014-10-12 ENCOUNTER — Emergency Department (HOSPITAL_COMMUNITY)
Admission: EM | Admit: 2014-10-12 | Discharge: 2014-10-13 | Disposition: A | Discharge status: Home / Self Care | Payer: BC Managed Care – PPO | Attending: Emergency Medicine | Admitting: Emergency Medicine

## 2014-10-12 ENCOUNTER — Emergency Department (HOSPITAL_COMMUNITY): Payer: BC Managed Care – PPO

## 2014-10-12 DIAGNOSIS — Z8505 Personal history of malignant neoplasm of liver: Secondary | ICD-10-CM | POA: Diagnosis not present

## 2014-10-12 DIAGNOSIS — Z7952 Long term (current) use of systemic steroids: Secondary | ICD-10-CM | POA: Diagnosis not present

## 2014-10-12 DIAGNOSIS — Z880 Allergy status to penicillin: Secondary | ICD-10-CM | POA: Insufficient documentation

## 2014-10-12 DIAGNOSIS — F419 Anxiety disorder, unspecified: Secondary | ICD-10-CM | POA: Insufficient documentation

## 2014-10-12 DIAGNOSIS — Z9221 Personal history of antineoplastic chemotherapy: Secondary | ICD-10-CM | POA: Diagnosis not present

## 2014-10-12 DIAGNOSIS — Z8709 Personal history of other diseases of the respiratory system: Secondary | ICD-10-CM | POA: Insufficient documentation

## 2014-10-12 DIAGNOSIS — Z79899 Other long term (current) drug therapy: Secondary | ICD-10-CM | POA: Insufficient documentation

## 2014-10-12 DIAGNOSIS — Y998 Other external cause status: Secondary | ICD-10-CM | POA: Insufficient documentation

## 2014-10-12 DIAGNOSIS — S90121A Contusion of right lesser toe(s) without damage to nail, initial encounter: Secondary | ICD-10-CM | POA: Insufficient documentation

## 2014-10-12 DIAGNOSIS — W228XXA Striking against or struck by other objects, initial encounter: Secondary | ICD-10-CM | POA: Diagnosis not present

## 2014-10-12 DIAGNOSIS — Z853 Personal history of malignant neoplasm of breast: Secondary | ICD-10-CM | POA: Insufficient documentation

## 2014-10-12 DIAGNOSIS — S91114A Laceration without foreign body of right lesser toe(s) without damage to nail, initial encounter: Secondary | ICD-10-CM | POA: Insufficient documentation

## 2014-10-12 DIAGNOSIS — Z8739 Personal history of other diseases of the musculoskeletal system and connective tissue: Secondary | ICD-10-CM | POA: Diagnosis not present

## 2014-10-12 DIAGNOSIS — K219 Gastro-esophageal reflux disease without esophagitis: Secondary | ICD-10-CM | POA: Insufficient documentation

## 2014-10-12 DIAGNOSIS — Y9289 Other specified places as the place of occurrence of the external cause: Secondary | ICD-10-CM | POA: Diagnosis not present

## 2014-10-12 DIAGNOSIS — S99921A Unspecified injury of right foot, initial encounter: Secondary | ICD-10-CM | POA: Diagnosis present

## 2014-10-12 DIAGNOSIS — Z923 Personal history of irradiation: Secondary | ICD-10-CM | POA: Diagnosis not present

## 2014-10-12 DIAGNOSIS — Y9389 Activity, other specified: Secondary | ICD-10-CM | POA: Diagnosis not present

## 2014-10-12 DIAGNOSIS — S90122A Contusion of left lesser toe(s) without damage to nail, initial encounter: Secondary | ICD-10-CM

## 2014-10-12 NOTE — ED Notes (Signed)
Pt slammed her toe in the door on Saturday, her middle right toe is black underneath

## 2014-10-13 MED ORDER — CLINDAMYCIN HCL 150 MG PO CAPS
300.0000 mg | ORAL_CAPSULE | Freq: Four times a day (QID) | ORAL | Status: DC
Start: 2014-10-13 — End: 2014-11-10

## 2014-10-13 NOTE — ED Provider Notes (Signed)
CSN: [id number]     Arrival date & time 10/12/14  2234 History   First MD Initiated Contact with Patient 10/12/14 2348     Chief Complaint  Patient presents with  . Toe Injury     (Consider location/radiation/quality/duration/timing/severity/associated sxs/prior Treatment) HPI Comments: Patient with history of breast cancer on chemotherapy, history of DVT on Lovenox -- presents with complaint of bruising and bleeding to her right second toe. Patient states that she closed her toe in a door 2 days ago. She has had some oozing of blood from the toe. She has cleaned the toe with soap and water and applied bandaging. She cannot weight directly on the toe and walks on the side of her foot. No other injuries. Mother bleeding. Patient was concerned about the color of the toe and came to the emergency department for evaluation. She is on chronic pain medication at home. No fever, chills, nausea or vomiting. No purulent drainage from the toe.  The history is provided by the patient and medical records.    Past Medical History  Diagnosis Date  . Recurrent sinus infections   . Arthritis   . PONV (postoperative nausea and vomiting)   . GERD (gastroesophageal reflux disease)   . 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 Anxiety     no problems at present  . rt breast ca dx'd 12/2011    breast  . Breast cancer   . 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 Radiation 07/16/14-07/22/14    Right chest wall nodule 20 Gy in 5 fractions   Past Surgical History  Procedure Laterality Date  . Tubal ligation    . Tubal ligation  1985  . Portacath placement N/A 01/10/2013    Procedure: ULTRASOUND GUIDED PORT-A-CATH INSERTION WITH FLUOROSCOPY;  Surgeon: [name], MD;  Location: [facility];  Service: General;  Laterality: N/A;  . Esophagogastroduodenoscopy N/A 08/19/2013    Procedure: ESOPHAGOGASTRODUODENOSCOPY (EGD);  Surgeon: Garlan Fair, MD;  Location: [facility];  Service: Endoscopy;  Laterality: N/A;  . Total mastectomy Right 02/17/2014    Procedure: PALLIATIVE  RIGHT MASTECTOMY;  Surgeon: [name], MD;  Location: [facility];  Service: General;  Laterality: Right;   Family History  Problem Relation Age of Onset  . Bladder Cancer Father   . Colon cancer Maternal Grandmother    History  Substance Use Topics  . Smoking status: Never Smoker   . Smokeless tobacco: Never Used  . Alcohol Use: No   OB History    No data available     Review of Systems  Constitutional: Negative for activity change.  Musculoskeletal: Positive for arthralgias. Negative for back pain, joint swelling and neck pain.  Skin: Positive for color change and wound.  Neurological: Negative for weakness and numbness.    Allergies  Codeine and Penicillins  Home Medications   Prior to Admission medications   Medication Sig Start Date End Date Taking? Authorizing Provider  acyclovir (ZOVIRAX) 400 MG tablet Take 1 tablet (400 mg total) by mouth 2 (two) times daily. 07/22/14  Yes Chauncey Cruel, MD  cetirizine (ZYRTEC) 10 MG tablet Take 10 mg by mouth daily.   Yes Historical Provider, MD  Cholecalciferol (VITAMIN D PO) Take 800 Units by mouth daily.   Yes Historical Provider, MD  [facility] GAS RELIEF 80 MG chewable tablet CHEW 1 TABLET BY MOUTH EVERY 6 (SIX) HOURS AS NEEDED FOR FLATULENCE. 09/04/14  Yes Virgie Dad Magrinat,  MD  dexamethasone (DECADRON) 2 MG tablet Take 2 mg by mouth daily.  08/02/14  Yes Historical Provider, MD  diphenoxylate-atropine (LOMOTIL) 2.5-0.025 MG per tablet Take 1 tablet by mouth 4 (four) times daily as needed for diarrhea or loose stools. 07/22/14  Yes Chauncey Cruel, MD  docusate sodium 100 MG CAPS Take 100 mg by mouth daily. Patient taking differently: Take 200 mg by mouth daily.  03/10/14  Yes Robbie Lis, MD  enoxaparin (LOVENOX) 100 MG/ML injection INJECT 0.95MLS (95MG  TOTAL) INTO THE SKIN DAILY 09/21/14  Yes Chauncey Cruel, MD   fluconazole (DIFLUCAN) 100 MG tablet Take 100 mg by mouth daily.   Yes Historical Provider, MD  gabapentin (NEURONTIN) 100 MG capsule TAKE 1 CAPSULE BY MOUTH 3 (THREE) TIMES DAILY. 09/25/14  Yes Chauncey Cruel, MD  HYDROmorphone (DILAUDID) 2 MG tablet Take 0.5 tablets (1 mg total) by mouth every 4 (four) hours as needed. 06/23/14  Yes Chauncey Cruel, MD  levETIRAcetam (KEPPRA) 1000 MG tablet Take 1,000 mg by mouth every 12 (twelve) hours.  08/10/14  Yes Historical Provider, MD  lidocaine-prilocaine (EMLA) cream Apply to affected area once Patient taking differently: Apply 1 application topically daily as needed (prior to port being access). Apply to affected area once 05/19/14  Yes Chauncey Cruel, MD  methylphenidate (RITALIN) 5 MG tablet Take 1 tablet (5 mg total) by mouth 2 (two) times daily with breakfast and lunch. 09/17/14  Yes Laurie Panda, NP  mirtazapine (REMERON) 30 MG tablet Take 0.5 tablets (15 mg total) by mouth at bedtime. 08/20/14  Yes Chauncey Cruel, MD  Multiple Vitamin (MULTIVITAMIN WITH MINERALS) TABS tablet Take 1 tablet by mouth daily.   Yes Historical Provider, MD  omeprazole (PRILOSEC) 20 MG capsule TAKE 1 CAPSULE (20 MG TOTAL) BY MOUTH 2 (TWO) TIMES DAILY BEFORE A MEAL. 07/21/14  Yes Chauncey Cruel, MD  prochlorperazine (COMPAZINE) 10 MG tablet TAKE 1 TABLET (10 MG TOTAL) BY MOUTH EVERY 6 (SIX) HOURS AS NEEDED (NAUSEA OR VOMITING). 06/09/14  Yes Chauncey Cruel, MD  RA KRILL OIL PO Take 1 capsule by mouth daily.   Yes Historical Provider, MD  traZODone (DESYREL) 50 MG tablet TAKE 1 TO 2 TABLETS BY MOUTH AT BEDTIME FOR SLEEP 10/08/14  Yes Chauncey Cruel, MD  traZODone (DESYREL) 50 MG tablet TAKE 1 TO 2 TABLETS BY MOUTH AT BEDTIME FOR SLEEP Patient not taking: Reported on 10/12/2014 09/17/14   Laurie Panda, NP   BP 127/87 mmHg  Pulse 100  Temp(Src) 99.6 F (37.6 C) (Oral)  Resp 20  SpO2 96%   Physical Exam  Constitutional: She appears well-developed and  well-nourished.  HENT:  Head: Normocephalic and atraumatic.  Eyes: Pupils are equal, round, and reactive to light.  Neck: Normal range of motion. Neck supple.  Cardiovascular: Exam reveals no decreased pulses.   Musculoskeletal: She exhibits tenderness. She exhibits no edema.  Neurological: She is alert. No sensory deficit.  Motor, sensation, and vascular distal to the injury is fully intact.   Skin: Skin is warm and dry.  There are skin tears along the lateral aspects of the R 2nd toe with hematoma formation along the plantar surface at the MTP joint. No current bleeding or bruising. No purulent drainage or discharge. No cellulitic appearance of the toe.  Psychiatric: She has a normal mood and affect.  Nursing note and vitals reviewed.   ED Course  Procedures (including critical care time) Labs Review Labs Reviewed -  No data to display  Imaging Review Dg Toe 2nd Right  10/13/2014   CLINICAL DATA:  Stubbed right second toe, with bleeding and swelling. Associated discoloration. Initial encounter.  EXAM: RIGHT SECOND TOE  COMPARISON:  None.  FINDINGS: There is no evidence of fracture or dislocation. Soft tissue swelling is noted about the second digit. No soft tissue air is seen. Visualized joint spaces are preserved.  IMPRESSION: No evidence of fracture or dislocation.   Electronically Signed   By: Garald Balding M.D.   On: 10/13/2014 00:24     EKG Interpretation None       12:30 AM Patient seen and examined. Work-up initiated.   Vital signs reviewed and are as follows: BP 127/87 mmHg  Pulse 100  Temp(Src) 99.6 F (37.6 C) (Oral)  Resp 20  SpO2 96%  Will provide rx clindamycin given open wound on extremity in in immunocompromised patients undergoing chemotherapy. Patient given postop shoe as well as wound care prior to discharge. Counseled on wound care. Pt urged to return with worsening pain, worsening swelling, expanding area of redness or streaking up extremity, fever, or  any other concerns. Urged to take complete course of antibiotics as prescribed. Pt verbalizes understanding and agrees with plan.   MDM   Final diagnoses:  Hematoma of toe of left foot, initial encounter   Patient with open wound and hematoma of toe. X-ray is negative for fracture. Patient has follow-up in 3 days at her chemo session.   No dangerous or life-threatening conditions suspected or identified by history, physical exam, and by work-up. No indications for hospitalization identified.      Carlisle Cater, PA-C 10/13/14 [id number]  Pamella Pert, MD 10/13/14 [phone]

## 2014-10-13 NOTE — Discharge Instructions (Signed)
Please read and follow all provided instructions.  Your diagnoses today include:  1. Hematoma of toe of left foot, initial encounter    Tests performed today include:  An x-ray of the affected area - does NOT show any broken bones  Vital signs. See below for your results today.   Medications prescribed:   Clindamycin - antibiotic  You have been prescribed an antibiotic medicine: take the entire course of medicine even if you are feeling better. Stopping early can cause the antibiotic not to work.  Take any prescribed medications only as directed.  Home care instructions:   Follow any educational materials contained in this packet  Follow R.I.C.E. Protocol:  R - rest your injury   I  - use ice on injury without applying directly to skin  C - compress injury with bandage or splint  E - elevate the injury as much as possible  Follow-up instructions: Please follow-up with your primary care provider this week for wound recheck.   Return instructions:   Please return if your toes or feet are numb or tingling, appear gray or blue, or you have severe pain (also elevate the leg and loosen splint or wrap if you were given one)  Please return to the Emergency Department if you experience worsening symptoms.   Please return if you have any other emergent concerns.  Additional Information:  Your vital signs today were: BP 127/87 mmHg   Pulse 100   Temp(Src) 99.6 F (37.6 C) (Oral)   Resp 20   SpO2 96% If your blood pressure (BP) was elevated above 135/85 this visit, please have this repeated by your doctor within one month. --------------

## 2014-10-15 ENCOUNTER — Ambulatory Visit: Payer: BC Managed Care – PPO

## 2014-10-15 ENCOUNTER — Ambulatory Visit (HOSPITAL_BASED_OUTPATIENT_CLINIC_OR_DEPARTMENT_OTHER): Payer: BC Managed Care – PPO

## 2014-10-15 ENCOUNTER — Ambulatory Visit (HOSPITAL_BASED_OUTPATIENT_CLINIC_OR_DEPARTMENT_OTHER): Payer: BC Managed Care – PPO | Admitting: Nurse Practitioner

## 2014-10-15 ENCOUNTER — Other Ambulatory Visit (HOSPITAL_BASED_OUTPATIENT_CLINIC_OR_DEPARTMENT_OTHER): Payer: BC Managed Care – PPO

## 2014-10-15 ENCOUNTER — Encounter: Payer: Self-pay | Admitting: Nurse Practitioner

## 2014-10-15 ENCOUNTER — Telehealth: Payer: Self-pay | Admitting: Oncology

## 2014-10-15 VITALS — BP 118/76 | HR 77 | Temp 97.7°F | Resp 18 | Ht 64.0 in | Wt 143.1 lb

## 2014-10-15 DIAGNOSIS — C7801 Secondary malignant neoplasm of right lung: Secondary | ICD-10-CM

## 2014-10-15 DIAGNOSIS — C773 Secondary and unspecified malignant neoplasm of axilla and upper limb lymph nodes: Secondary | ICD-10-CM

## 2014-10-15 DIAGNOSIS — C50411 Malignant neoplasm of upper-outer quadrant of right female breast: Secondary | ICD-10-CM

## 2014-10-15 DIAGNOSIS — E46 Unspecified protein-calorie malnutrition: Secondary | ICD-10-CM

## 2014-10-15 DIAGNOSIS — R1310 Dysphagia, unspecified: Secondary | ICD-10-CM

## 2014-10-15 DIAGNOSIS — C787 Secondary malignant neoplasm of liver and intrahepatic bile duct: Secondary | ICD-10-CM | POA: Diagnosis not present

## 2014-10-15 DIAGNOSIS — C7931 Secondary malignant neoplasm of brain: Principal | ICD-10-CM

## 2014-10-15 DIAGNOSIS — C50911 Malignant neoplasm of unspecified site of right female breast: Secondary | ICD-10-CM

## 2014-10-15 DIAGNOSIS — Z95828 Presence of other vascular implants and grafts: Secondary | ICD-10-CM

## 2014-10-15 DIAGNOSIS — Z5111 Encounter for antineoplastic chemotherapy: Secondary | ICD-10-CM | POA: Diagnosis not present

## 2014-10-15 DIAGNOSIS — C7802 Secondary malignant neoplasm of left lung: Secondary | ICD-10-CM

## 2014-10-15 DIAGNOSIS — C50919 Malignant neoplasm of unspecified site of unspecified female breast: Secondary | ICD-10-CM

## 2014-10-15 LAB — CBC WITH DIFFERENTIAL/PLATELET
BASO%: 0.6 % (ref 0.0–2.0)
Basophils Absolute: 0 10*3/uL (ref 0.0–0.1)
EOS%: 0.3 % (ref 0.0–7.0)
Eosinophils Absolute: 0 10*3/uL (ref 0.0–0.5)
HCT: 39.8 % (ref 34.8–46.6)
HGB: 13.3 g/dL (ref 11.6–15.9)
LYMPH%: 16.2 % (ref 14.0–49.7)
MCH: 32.6 pg (ref 25.1–34.0)
MCHC: 33.4 g/dL (ref 31.5–36.0)
MCV: 97.5 fL (ref 79.5–101.0)
MONO#: 1 10*3/uL — AB (ref 0.1–0.9)
MONO%: 14.8 % — ABNORMAL HIGH (ref 0.0–14.0)
NEUT#: 4.5 10*3/uL (ref 1.5–6.5)
NEUT%: 68.1 % (ref 38.4–76.8)
PLATELETS: ADEQUATE 10*3/uL (ref 145–400)
RBC: 4.08 10*6/uL (ref 3.70–5.45)
RDW: 17.4 % — AB (ref 11.2–14.5)
WBC: 6.6 10*3/uL (ref 3.9–10.3)
lymph#: 1.1 10*3/uL (ref 0.9–3.3)
nRBC: 1 % — ABNORMAL HIGH (ref 0–0)

## 2014-10-15 LAB — COMPREHENSIVE METABOLIC PANEL (CC13)
ALK PHOS: 361 U/L — AB (ref 40–150)
ALT: 108 U/L — AB (ref 0–55)
AST: 73 U/L — ABNORMAL HIGH (ref 5–34)
Albumin: 3 g/dL — ABNORMAL LOW (ref 3.5–5.0)
Anion Gap: 10 mEq/L (ref 3–11)
BILIRUBIN TOTAL: 0.54 mg/dL (ref 0.20–1.20)
BUN: 10.6 mg/dL (ref 7.0–26.0)
CO2: 26 mEq/L (ref 22–29)
CREATININE: 0.7 mg/dL (ref 0.6–1.1)
Calcium: 9.1 mg/dL (ref 8.4–10.4)
Chloride: 104 mEq/L (ref 98–109)
GLUCOSE: 93 mg/dL (ref 70–140)
Potassium: 3.7 mEq/L (ref 3.5–5.1)
Sodium: 140 mEq/L (ref 136–145)
TOTAL PROTEIN: 6.4 g/dL (ref 6.4–8.3)

## 2014-10-15 LAB — TECHNOLOGIST REVIEW: Technologist Review: 1

## 2014-10-15 MED ORDER — ALTEPLASE 2 MG IJ SOLR
2.0000 mg | Freq: Once | INTRAMUSCULAR | Status: AC | PRN
  Administered 2014-10-15: 2 mg
  Filled 2014-10-15: qty 2

## 2014-10-15 MED ORDER — SODIUM CHLORIDE 0.9 % IJ SOLN
10.0000 mL | INTRAMUSCULAR | Status: DC | PRN
  Administered 2014-10-15: 10 mL via INTRAVENOUS
  Filled 2014-10-15: qty 10

## 2014-10-15 MED ORDER — HEPARIN SOD (PORK) LOCK FLUSH 100 UNIT/ML IV SOLN
500.0000 [IU] | Freq: Once | INTRAVENOUS | Status: AC | PRN
  Administered 2014-10-15: 500 [IU]
  Filled 2014-10-15: qty 5

## 2014-10-15 MED ORDER — SODIUM CHLORIDE 0.9 % IV SOLN
Freq: Once | INTRAVENOUS | Status: AC
  Administered 2014-10-15: 14:00:00 via INTRAVENOUS

## 2014-10-15 MED ORDER — SODIUM CHLORIDE 0.9 % IV SOLN
Freq: Once | INTRAVENOUS | Status: AC
  Administered 2014-10-15: 14:00:00 via INTRAVENOUS
  Filled 2014-10-15: qty 4

## 2014-10-15 MED ORDER — DOXORUBICIN HCL LIPOSOMAL CHEMO INJECTION 2 MG/ML
43.0000 mg/m2 | Freq: Once | INTRAVENOUS | Status: AC
  Administered 2014-10-15: 70 mg via INTRAVENOUS
  Filled 2014-10-15: qty 25

## 2014-10-15 MED ORDER — SODIUM CHLORIDE 0.9 % IJ SOLN
10.0000 mL | INTRAMUSCULAR | Status: DC | PRN
Start: 2014-10-15 — End: 2014-10-15
  Administered 2014-10-15: 10 mL
  Filled 2014-10-15: qty 10

## 2014-10-15 NOTE — Progress Notes (Signed)
Patient in for labs and [facility] access today. Patient's PAC accessed and flushed without any difficulty. No blood return noted with flush. Cindi Carbon, RN verified no blood return from [facility] After reclining Patient in chair and having Patient lean forward still no blood return noted. Patient's PAC covered with Opsite dressing. Cindi Carbon, RN administered Cathflo via [facility] and Patient discharged from the Flush Room. Infusion Room Charge Nurse Amy Horton, RN made aware of Cathflo administration.

## 2014-10-15 NOTE — Patient Instructions (Signed)

## 2014-10-15 NOTE — Progress Notes (Signed)
Per Dr. [name], okay to treat pt with ALT of 108.

## 2014-10-15 NOTE — Progress Notes (Signed)
. Patient ID: Carrie Berry, female   DOB: 11-25-1950, 64 y.o.   MRN: [id number] ID: Carrie Berry OB: 1951-01-30  MR#: [id number]  CSN#:[id number]  PCP: Carrie Hams, Carrie Berry GYN:   SU: Carrie Berry OTHER Carrie Berry: Carrie Berry, Carrie Berry, Carrie Berry, Carrie Berry  CHIEF COMPLAINT:  Metastatic Breast Cancer CURRENT TREATMENT: Doxil  BREAST CANCER HISTORY: From the originally intake note:  Carrie Berry noted a mass in her right breast December of 2013, but did not think much of it. It did grow some lower the summer, but she was keeping her grandchild at that time and was too busy so she did not bring it to [facility] attention until August. He set her up for bilateral mammography and ultrasonography at [facility] 12/18/2012 and this measured, on the right, a large irregular lobulated mass in the upper outer quadrant which by ultrasound measured 5.7 cm. The right axilla showed some lymph nodes with thickened cortices. In the left breast there was a focal asymmetry at the depth, lateral to the nipple, and by ultrasound there was a 9 mm ill-defined hypoechoic lesion in this location. Biopsy of the left breast lesion showed only fibrocystic changes.  Biopsy of the right breast mass and right axillary adenopathy ([id number]) on 12/23/2012 showed both to be involved by invasive ductal carcinoma, grade 2, triple negative, with an MIB-1 of 86%.  MRI of the breast 12/27/2012 at [facility] showed in the right breast a mass abutting the pectoralis muscle without enhancement of the muscle measuring 4.8 cm. The satellite nodule measuring approximately 3 mm was also noted superior to the mass and several abnormal and enlarged right axillary lymph nodes were noted, one of which appeared to be necrotic. The largest node measured 2.0 cm. Unfortunately, numerous bilateral pulmonary nodules were also noted.  The patient's subsequent history is as detailed below   INTERVAL HISTORY: Carrie Berry returns today  for follow-up of her metastatic breast cancer, accompanied by her husband Carrie Berry. Today is day 1 cycle 5 of liposomal doxorubicin, which she receives every 28 days. She tolerates this generally well with few complaints. She visited the ED on Monday of this week for a right toe injury. She got her 2nd toe caught in a screen door and yanked it out creating an open wound and hematoma. The Xray was negative for fracture. She was provided a prescription for clindamycin and a mini boot to wear. Surprisingly, this is not painful for her.  REVIEW OF SYSTEMS Carrie Berry denies fevers or chills. She has had some mild nausea that comes and goes. Recently she has had difficulty swallowing, with the food getting stuck in her esophagus about half way down. She has to take a break and wash the food down with plenty of water to be able to continue. She takes 1 colace daily and has good bowel movements with this. She uses gas-X PRN for comfort. She has had the return of her abdominal pain, but less severe. She uses dilaudid PRN for this pain. . She denies shortness of breath, chest pain, cough, or palpitations. Her appetite is good. She sleeps fairly well at night with trazodone. She has occasional headaches, but denies dizziness, vision changes, or weakness. A detailed review of systems is otherwise stable.   PAST MEDICAL HISTORY: Past Medical History  Diagnosis Date  . Recurrent sinus infections   . Arthritis   . PONV (postoperative nausea and vomiting)   . GERD (gastroesophageal reflux disease)   . Antineoplastic chemotherapy  induced pancytopenia 06/25/2013  . Anxiety     no problems at present  . rt breast ca dx'd 12/2011    breast  . Breast cancer   . 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 Radiation 07/16/14-07/22/14    Right chest wall nodule 20 Gy in 5 fractions    PAST SURGICAL HISTORY: Past Surgical History  Procedure Laterality Date  . Tubal ligation    . Tubal ligation  1985  .  Portacath placement N/A 01/10/2013    Procedure: ULTRASOUND GUIDED PORT-A-CATH INSERTION WITH FLUOROSCOPY;  Surgeon: Carrie Hollingshead, Carrie Berry;  Location: [facility];  Service: General;  Laterality: N/A;  . Esophagogastroduodenoscopy N/A 08/19/2013    Procedure: ESOPHAGOGASTRODUODENOSCOPY (EGD);  Surgeon: Carrie Fair, Carrie Berry;  Location: [facility];  Service: Endoscopy;  Laterality: N/A;  . Total mastectomy Right 02/17/2014    Procedure: PALLIATIVE  RIGHT MASTECTOMY;  Surgeon: Carrie Confer, Carrie Berry;  Location: [facility];  Service: General;  Laterality: Right;    FAMILY HISTORY Family History  Problem Relation Age of Onset  . Bladder Cancer Father   . Colon cancer Maternal Grandmother    the patient's parents are living, both in their 20s. The patient's father was diagnosed with bladder cancer the age of 72. The patient's mother's mother had some type of gastrointestinal cancer. The patient had one brother, no sisters. There is no history of breast or ovarian cancer in the family other than a cousin on the father's side who was diagnosed with breast cancer apparently before the age of 52.  GYNECOLOGIC HISTORY:   (Reviewed 10/13/2013) Menarche age 88, first live birth age 22, the patient is [facility] P2. She went through menopause in 2008. She did not take hormone replacement. She took birth control for approximately 22 years remotely.  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 She's currently on disability. Her husband Carrie Berry Carrie Berry"), is vice Development worker, international aid for [employer]. The patient's daughter Carrie Berry is a stay-at-home mom in [place], the patient's son Carrie Berry unfortunately died in an automobile accident in December of 2011. The patient has 2 grandchildren. She attends to a local [facility].    ADVANCED DIRECTIVES: Not in place   HEALTH MAINTENANCE: (Updated  10/13/2013) History  Substance Use Topics  . Smoking status: Never Smoker   . Smokeless tobacco: Never Used  . Alcohol Use: No     Colonoscopy: 2005  PAP: Not on file  Bone density: Not on file  Lipid panel:  Not on file/Dr. Polite   Allergies  Allergen Reactions  . Codeine Nausea And Vomiting    "violently ill"  . Penicillins Rash    Childhood reaction -    Current Outpatient Prescriptions  Medication Sig Dispense Refill  . acyclovir (ZOVIRAX) 400 MG tablet Take 1 tablet (400 mg total) by mouth 2 (two) times daily. 60 tablet 6  . cetirizine (ZYRTEC) 10 MG tablet Take 10 mg by mouth daily.    . Cholecalciferol (VITAMIN D PO) Take 800 Units by mouth daily.    . clindamycin (CLEOCIN) 150 MG capsule Take 2 capsules (300 mg total) by mouth every 6 (six) hours. 56 capsule 0  . [employer] GAS RELIEF 80 MG chewable tablet CHEW 1 TABLET BY MOUTH EVERY 6 (SIX) HOURS AS NEEDED FOR FLATULENCE. 30 tablet 0  . dexamethasone (DECADRON) 2 MG tablet Take 2 mg by mouth  daily.   4  . docusate sodium 100 MG CAPS Take 100 mg by mouth daily. (Patient taking differently: Take 200 mg by mouth daily. ) 10 capsule 0  . enoxaparin (LOVENOX) 100 MG/ML injection INJECT 0.95MLS (95MG  TOTAL) INTO THE SKIN DAILY 30 Syringe 0  . fluconazole (DIFLUCAN) 100 MG tablet Take 100 mg by mouth daily.    Marland Kitchen gabapentin (NEURONTIN) 100 MG capsule TAKE 1 CAPSULE BY MOUTH 3 (THREE) TIMES DAILY. 90 capsule 2  . HYDROmorphone (DILAUDID) 2 MG tablet Take 0.5 tablets (1 mg total) by mouth every 4 (four) hours as needed. 10 tablet 0  . levETIRAcetam (KEPPRA) 1000 MG tablet TAKE 1 TABLET (1,000 MG TOTAL) BY MOUTH EVERY 12 (TWELVE) HOURS. 60 tablet 4  . lidocaine-prilocaine (EMLA) cream Apply to affected area once (Patient taking differently: Apply 1 application topically daily as needed (prior to port being access). Apply to affected area once) 30 g 3  . methylphenidate (RITALIN) 5 MG tablet Take 1 tablet (5 mg total) by mouth 2 (two)  times daily with breakfast and lunch. 60 tablet 0  . mirtazapine (REMERON) 30 MG tablet Take 0.5 tablets (15 mg total) by mouth at bedtime. 30 tablet 1  . Multiple Vitamin (MULTIVITAMIN WITH MINERALS) TABS tablet Take 1 tablet by mouth daily.    Marland Kitchen omeprazole (PRILOSEC) 20 MG capsule TAKE 1 CAPSULE (20 MG TOTAL) BY MOUTH 2 (TWO) TIMES DAILY BEFORE A MEAL. 60 capsule 6  . prochlorperazine (COMPAZINE) 10 MG tablet TAKE 1 TABLET (10 MG TOTAL) BY MOUTH EVERY 6 (SIX) HOURS AS NEEDED (NAUSEA OR VOMITING). 30 tablet 1  . RA KRILL OIL PO Take 1 capsule by mouth daily.    . traZODone (DESYREL) 50 MG tablet TAKE 1 TO 2 TABLETS BY MOUTH AT BEDTIME FOR SLEEP 60 tablet 0  . diphenoxylate-atropine (LOMOTIL) 2.5-0.025 MG per tablet Take 1 tablet by mouth 4 (four) times daily as needed for diarrhea or loose stools. (Patient not taking: Reported on 10/15/2014) 30 tablet 0   No current facility-administered medications for this visit.   Facility-Administered Medications Ordered in Other Visits  Medication Dose Route Frequency Provider Last Rate Last Dose  . DOXOrubicin HCL LIPOSOMAL (DOXIL) 70 mg in dextrose 5 % 250 mL chemo infusion  43 mg/m2 (Treatment Plan Actual) Intravenous Once Chauncey Cruel, Carrie Berry      . heparin lock flush 100 unit/mL  500 Units Intracatheter Once PRN Chauncey Cruel, Carrie Berry      . ondansetron (ZOFRAN) 8 mg, dexamethasone (DECADRON) 10 mg in sodium chloride 0.9 % 50 mL IVPB   Intravenous Once Chauncey Cruel, Carrie Berry      . sodium chloride 0.9 % injection 10 mL  10 mL Intravenous PRN Laurie Panda, NP   10 mL at 10/15/14 1205  . sodium chloride 0.9 % injection 10 mL  10 mL Intracatheter PRN Chauncey Cruel, Carrie Berry        OBJECTIVE: Middle-aged white woman in no acute distress Filed Vitals:   10/15/14 1103  BP: 118/76  Pulse: 77  Temp: 97.7 F (36.5 C)  Resp: 18     Body mass index is 24.56 kg/(m^2).    ECOG FS: 2 Filed Weights   10/15/14 1103  Weight: 143 lb 2 oz (64.921 kg)   Skin:  warm, dry  HEENT: sclerae anicteric, conjunctivae pink, oropharynx clear. No thrush or mucositis.  Lymph Nodes: No cervical or supraclavicular lymphadenopathy  Lungs: clear to auscultation bilaterally, no rales, wheezes, or  rhonci  Heart: regular rate and rhythm  Abdomen: round, soft, non tender, positive bowel sounds  Musculoskeletal: No focal spinal tenderness, no peripheral edema, right foot in boot with toe stabilizer  Neuro: non focal, well oriented, positive affect  Breasts: deferred  LAB RESULTS:   Lab Results  Component Value Date   WBC 6.6 10/15/2014   NEUTROABS 4.5 10/15/2014   HGB 13.3 10/15/2014   HCT 39.8 10/15/2014   MCV 97.5 10/15/2014   PLT Clumped Platelets--Appears Adequate 10/15/2014      Chemistry      Component Value Date/Time   NA 140 10/15/2014 1208   NA 138 08/27/2014 0840   K 3.7 10/15/2014 1208   K 3.8 08/27/2014 0840   CL 104 08/27/2014 0840   CO2 26 10/15/2014 1208   CO2 28 08/27/2014 0840   BUN 10.6 10/15/2014 1208   BUN 14 08/27/2014 0840   CREATININE 0.7 10/15/2014 1208   CREATININE 0.35* 08/27/2014 0840      Component Value Date/Time   CALCIUM 9.1 10/15/2014 1208   CALCIUM 8.6 08/27/2014 0840   ALKPHOS 361* 10/15/2014 1208   ALKPHOS 347* 08/27/2014 0840   AST 73* 10/15/2014 1208   AST 408* 08/27/2014 0840   ALT 108* 10/15/2014 1208   ALT 326* 08/27/2014 0840   BILITOT 0.54 10/15/2014 1208   BILITOT 1.9* 08/27/2014 0840        STUDIES: Dg Toe 2nd Right  10/13/2014   CLINICAL DATA:  Stubbed right second toe, with bleeding and swelling. Associated discoloration. Initial encounter.  EXAM: RIGHT SECOND TOE  COMPARISON:  None.  FINDINGS: There is no evidence of fracture or dislocation. Soft tissue swelling is noted about the second digit. No soft tissue air is seen. Visualized joint spaces are preserved.  IMPRESSION: No evidence of fracture or dislocation.   Electronically Signed   By: Garald Balding M.D.   On: 10/13/2014 00:24    CLINICAL DATA: Systolic chest heart failure. Evaluate cardiac function prior to chemotherapy.  EXAM: NUCLEAR MEDICINE CARDIAC BLOOD POOL IMAGING (MUGA)  TECHNIQUE: Cardiac multi-gated acquisition was performed at rest following intravenous injection of Tc-53m labeled red blood cells.  RADIOPHARMACEUTICALS: Twenty-four mCiTc-47m in-vitro labeled red blood cells.  COMPARISON: None.  FINDINGS: No focal wall motion abnormality of the left ventricle.  Calculated left ventricular ejection fraction equals 63%  IMPRESSION: Left ventricular ejection fraction equals 63%    ASSESSMENT: 64 y.o. [name], [place] woman status post right breast upper outer quadrant and right axillary lymph node biopsy 12/23/2012 for a clinical T3 N1 M1, stage IV invasive ductal carcinoma, grade 3, triple negative, with an MIB-1 of 86%.  (1) right liver lobe biopsy 01/21/2013 confirms metastatic adenocarcinoma; scans show involvement of the liver, lungs, and likely bone.  (2) enrolled in [facility] study [id number] (docetaxel + oral gamma secretase inibitor [id number]), received one cycle starting 02/04/2013  but withdrew because of poor tolerance despite treatment interruptions and decreased dosing of the oral component  (3)  chest CT in early November 2014 was compared with studies in August 2014 and showed interval progression of metastatic breast cancer, with an enlarging primary right breast mass, enlarging right axillary lymph nodes, enlarging pulmonary nodules and new/enlarging hepatic metastases.  (4) Abraxane started 03/19/2013, given day 1 and day 8 of each 21 day cycle, stopped with the day 1 cycle 3 dose (04/29/2013) because of local progression of disease  (5) cyclophosphamide and doxorubicin started 05/20/2013, given in dose dense fashion  with Neulasta support on day  2. Completed 4 cycles 07/03/2013, with the final cycle dose reduced 15% because of an episode of febrile neutropenia after cycle 3.  Restaging studies 07/15/2013 showed partial response.  (6) started capecitabine April 2015, 1.5 g by mouth twice a day, 7 days on 7 days off--discontinued October 2015 with progression  (7) s/p Right simple mastectomy 02/17/2014 to optimize local control, the pathology ([facility] [id number]) showing an invasive ductal carcinoma measuring 4.8 cm, grade 3, with close but negative margins and evidence of treatment response   (a) nodule on right mastectomy scar noted 04/03/2014, removed 04/14/2014  (b) new nodule noted on R chest scar 05/19/2014  (c) radiation to R chest wall completed 07/22/2014  (8) multiple brain metastases noted on brain MRI 02/26/2014--s/p whole brain irradiation completed 03/17/2014  (a) repeat brain MRI 05/12/2014 shows favorable response  (b) repeat brain MRI 07/15/2014 shows questionable growth/ stable disease  (9) protein-calorie malnutrition  (10) high fall risk  (11) advanced directives: not in place; husband is healthcare power of attorney  (12) unexplained paroxysmal knee pain: possible ruptured Baker's cyst, Left, 05/01/2014  (13) doxil started 05/28/2014, repeated every 28 days. Cycle 3 delayed 2 weeks because of decrease in ejection fraction, which proved spurious (MUGA results 63% EF). Doxil esumed 04//11/2014 with evidence of response   PLAN: Darleene is feeling well today. The labs were reviewed in detail, with the usual elevations in her AST, ALT, and alkaline phosphatase. She will proceed with cycle 5 of doxil as planned today.  As for the difficulty swallowing, I suggested she chew her food thorough and to swallow smaller quantities at a time. She may be interested in visiting with an ENT who could perform an EGD in the future.  Cornelia would be due for cycle 6 in 4 weeks, but she plans to go to the beach that immediate weekend and does not want to risk feeling poorly. Keeping this in mind I have pushed back her treatment for an additional week and a half to allow her  this time off. When she returns she will have a repeat liver MRI and will discuss the results with Dr. [name].  She understands and agrees with this plan. She knows the goal of treatment in her case is control. She has been encouraged to call with any issues that might arise before her next visit here.    Laurie Panda, NP   10/15/2014 1:41 PM                                                                                                                                                                                                                                                                                                                                                                                                                                                                                                                                                                                                                                                                                                                                                                                                                                                                                                                                                                                                                                                                                                                                                                                                                                                                                                                                                                                                                                                                                                                                                                                                                                                                                                                                                                    `

## 2014-10-15 NOTE — Telephone Encounter (Signed)
Gave patient avs report and appointments for July. Flush appointments added and arrived - patient sent back to flush for lab draw prior to chemo. Patient/lab/inf aware.

## 2014-10-15 NOTE — Patient Instructions (Signed)
[facility] Discharge Instructions for Patients Receiving Chemotherapy  Today you received the following chemotherapy agents: Doxil  To help prevent nausea and vomiting after your treatment, we encourage you to take your nausea medication as directed.  If you develop nausea and vomiting that is not controlled by your nausea medication, call the clinic.   BELOW ARE SYMPTOMS THAT SHOULD BE REPORTED IMMEDIATELY:  *FEVER GREATER THAN 100.5 F  *CHILLS WITH OR WITHOUT FEVER  NAUSEA AND VOMITING THAT IS NOT CONTROLLED WITH YOUR NAUSEA MEDICATION  *UNUSUAL SHORTNESS OF BREATH  *UNUSUAL BRUISING OR BLEEDING  TENDERNESS IN MOUTH AND THROAT WITH OR WITHOUT PRESENCE OF ULCERS  *URINARY PROBLEMS  *BOWEL PROBLEMS  UNUSUAL RASH Items with * indicate a potential emergency and should be followed up as soon as possible.  Feel free to call the clinic you have any questions or concerns. The clinic phone number is [phone].  Please show the CHEMO ALERT CARD at check-in to the Emergency Department and triage nurse.   

## 2014-10-20 ENCOUNTER — Other Ambulatory Visit: Payer: Self-pay | Admitting: *Deleted

## 2014-10-21 ENCOUNTER — Other Ambulatory Visit: Payer: Self-pay | Admitting: *Deleted

## 2014-10-21 DIAGNOSIS — C7931 Secondary malignant neoplasm of brain: Principal | ICD-10-CM

## 2014-10-21 DIAGNOSIS — C787 Secondary malignant neoplasm of liver and intrahepatic bile duct: Secondary | ICD-10-CM

## 2014-10-21 DIAGNOSIS — C50911 Malignant neoplasm of unspecified site of right female breast: Secondary | ICD-10-CM

## 2014-10-21 MED ORDER — METHYLPHENIDATE HCL 5 MG PO TABS: 5.0000 mg | ORAL_TABLET | Freq: Two times a day (BID) | ORAL | Status: DC

## 2014-10-24 ENCOUNTER — Other Ambulatory Visit: Payer: Self-pay | Admitting: Oncology

## 2014-10-26 ENCOUNTER — Other Ambulatory Visit: Payer: Self-pay | Admitting: Radiation Therapy

## 2014-10-26 DIAGNOSIS — C7931 Secondary malignant neoplasm of brain: Secondary | ICD-10-CM

## 2014-10-30 ENCOUNTER — Other Ambulatory Visit: Payer: Self-pay | Admitting: Oncology

## 2014-10-30 ENCOUNTER — Ambulatory Visit
Admission: RE | Admit: 2014-10-30 | Discharge: 2014-10-30 | Disposition: A | Discharge status: Home / Self Care | Payer: BC Managed Care – PPO | Source: Ambulatory Visit | Attending: Radiation Oncology | Admitting: Radiation Oncology

## 2014-10-30 DIAGNOSIS — C7931 Secondary malignant neoplasm of brain: Secondary | ICD-10-CM

## 2014-10-30 MED ORDER — GADOBENATE DIMEGLUMINE 529 MG/ML IV SOLN
13.0000 mL | Freq: Once | INTRAVENOUS | Status: AC | PRN
  Administered 2014-10-30: 13 mL via INTRAVENOUS

## 2014-10-30 NOTE — Telephone Encounter (Signed)
Last OV 10/15/14.  Next OV 11/25/14.  Chart reviewed

## 2014-10-31 ENCOUNTER — Other Ambulatory Visit: Payer: Self-pay | Admitting: Oncology

## 2014-11-02 ENCOUNTER — Ambulatory Visit (HOSPITAL_COMMUNITY)
Admission: RE | Admit: 2014-11-02 | Discharge: 2014-11-02 | Disposition: A | Discharge status: Home / Self Care | Payer: BC Managed Care – PPO | Source: Ambulatory Visit | Attending: Nurse Practitioner | Admitting: Nurse Practitioner

## 2014-11-02 DIAGNOSIS — C50911 Malignant neoplasm of unspecified site of right female breast: Secondary | ICD-10-CM | POA: Diagnosis not present

## 2014-11-02 DIAGNOSIS — C787 Secondary malignant neoplasm of liver and intrahepatic bile duct: Secondary | ICD-10-CM | POA: Diagnosis not present

## 2014-11-02 MED ORDER — GADOBENATE DIMEGLUMINE 529 MG/ML IV SOLN
15.0000 mL | Freq: Once | INTRAVENOUS | Status: AC | PRN
  Administered 2014-11-02: 12 mL via INTRAVENOUS

## 2014-11-03 ENCOUNTER — Other Ambulatory Visit: Payer: Self-pay | Admitting: Oncology

## 2014-11-06 ENCOUNTER — Other Ambulatory Visit: Payer: Self-pay | Admitting: Oncology

## 2014-11-08 ENCOUNTER — Other Ambulatory Visit: Payer: Self-pay | Admitting: Oncology

## 2014-11-10 ENCOUNTER — Encounter (HOSPITAL_COMMUNITY): Payer: Self-pay | Admitting: *Deleted

## 2014-11-10 ENCOUNTER — Encounter: Payer: Self-pay | Admitting: Radiation Oncology

## 2014-11-10 ENCOUNTER — Inpatient Hospital Stay (HOSPITAL_COMMUNITY)
Admission: EM | Admit: 2014-11-10 | Discharge: 2014-11-14 | DRG: 372 | Disposition: A | Discharge status: Home Health | Payer: BC Managed Care – PPO | Attending: Internal Medicine | Admitting: Internal Medicine

## 2014-11-10 ENCOUNTER — Encounter (HOSPITAL_COMMUNITY): Payer: Self-pay | Admitting: Emergency Medicine

## 2014-11-10 ENCOUNTER — Emergency Department (HOSPITAL_COMMUNITY): Payer: BC Managed Care – PPO

## 2014-11-10 ENCOUNTER — Ambulatory Visit: Payer: BC Managed Care – PPO

## 2014-11-10 DIAGNOSIS — C7931 Secondary malignant neoplasm of brain: Secondary | ICD-10-CM | POA: Diagnosis not present

## 2014-11-10 DIAGNOSIS — R627 Adult failure to thrive: Secondary | ICD-10-CM

## 2014-11-10 DIAGNOSIS — Z800 Family history of malignant neoplasm of digestive organs: Secondary | ICD-10-CM

## 2014-11-10 DIAGNOSIS — Z923 Personal history of irradiation: Secondary | ICD-10-CM

## 2014-11-10 DIAGNOSIS — K219 Gastro-esophageal reflux disease without esophagitis: Secondary | ICD-10-CM | POA: Diagnosis present

## 2014-11-10 DIAGNOSIS — Z7901 Long term (current) use of anticoagulants: Secondary | ICD-10-CM

## 2014-11-10 DIAGNOSIS — Z8052 Family history of malignant neoplasm of bladder: Secondary | ICD-10-CM

## 2014-11-10 DIAGNOSIS — F419 Anxiety disorder, unspecified: Secondary | ICD-10-CM | POA: Diagnosis present

## 2014-11-10 DIAGNOSIS — Z853 Personal history of malignant neoplasm of breast: Secondary | ICD-10-CM

## 2014-11-10 DIAGNOSIS — C787 Secondary malignant neoplasm of liver and intrahepatic bile duct: Secondary | ICD-10-CM | POA: Diagnosis present

## 2014-11-10 DIAGNOSIS — E43 Unspecified severe protein-calorie malnutrition: Secondary | ICD-10-CM | POA: Diagnosis present

## 2014-11-10 DIAGNOSIS — R748 Abnormal levels of other serum enzymes: Secondary | ICD-10-CM | POA: Diagnosis not present

## 2014-11-10 DIAGNOSIS — Z79899 Other long term (current) drug therapy: Secondary | ICD-10-CM

## 2014-11-10 DIAGNOSIS — A047 Enterocolitis due to Clostridium difficile: Principal | ICD-10-CM | POA: Diagnosis present

## 2014-11-10 DIAGNOSIS — M1990 Unspecified osteoarthritis, unspecified site: Secondary | ICD-10-CM | POA: Diagnosis present

## 2014-11-10 DIAGNOSIS — C7800 Secondary malignant neoplasm of unspecified lung: Secondary | ICD-10-CM

## 2014-11-10 DIAGNOSIS — A0472 Enterocolitis due to Clostridium difficile, not specified as recurrent: Secondary | ICD-10-CM | POA: Diagnosis present

## 2014-11-10 DIAGNOSIS — C50919 Malignant neoplasm of unspecified site of unspecified female breast: Secondary | ICD-10-CM

## 2014-11-10 DIAGNOSIS — E860 Dehydration: Secondary | ICD-10-CM | POA: Diagnosis present

## 2014-11-10 DIAGNOSIS — Z885 Allergy status to narcotic agent status: Secondary | ICD-10-CM

## 2014-11-10 DIAGNOSIS — R531 Weakness: Secondary | ICD-10-CM

## 2014-11-10 DIAGNOSIS — Z79891 Long term (current) use of opiate analgesic: Secondary | ICD-10-CM | POA: Diagnosis not present

## 2014-11-10 DIAGNOSIS — C50411 Malignant neoplasm of upper-outer quadrant of right female breast: Secondary | ICD-10-CM

## 2014-11-10 DIAGNOSIS — R109 Unspecified abdominal pain: Secondary | ICD-10-CM

## 2014-11-10 DIAGNOSIS — D0590 Unspecified type of carcinoma in situ of unspecified breast: Secondary | ICD-10-CM

## 2014-11-10 DIAGNOSIS — Z880 Allergy status to penicillin: Secondary | ICD-10-CM

## 2014-11-10 DIAGNOSIS — Z7952 Long term (current) use of systemic steroids: Secondary | ICD-10-CM

## 2014-11-10 DIAGNOSIS — R5381 Other malaise: Secondary | ICD-10-CM

## 2014-11-10 DIAGNOSIS — K5900 Constipation, unspecified: Secondary | ICD-10-CM

## 2014-11-10 DIAGNOSIS — C50911 Malignant neoplasm of unspecified site of right female breast: Secondary | ICD-10-CM | POA: Diagnosis present

## 2014-11-10 DIAGNOSIS — A084 Viral intestinal infection, unspecified: Secondary | ICD-10-CM | POA: Diagnosis present

## 2014-11-10 DIAGNOSIS — C7801 Secondary malignant neoplasm of right lung: Secondary | ICD-10-CM

## 2014-11-10 HISTORY — DX: Localization-related (focal) (partial) symptomatic epilepsy and epileptic syndromes with simple partial seizures, not intractable, without status epilepticus: G40.109

## 2014-11-10 LAB — CBC WITH DIFFERENTIAL/PLATELET
BASOS ABS: 0 10*3/uL (ref 0.0–0.1)
BASOS PCT: 1 % (ref 0–1)
Eosinophils Absolute: 0 10*3/uL (ref 0.0–0.7)
Eosinophils Relative: 0 % (ref 0–5)
HCT: 37.8 % (ref 36.0–46.0)
Hemoglobin: 12.5 g/dL (ref 12.0–15.0)
Lymphocytes Relative: 16 % (ref 12–46)
Lymphs Abs: 1.1 10*3/uL (ref 0.7–4.0)
MCH: 33.2 pg (ref 26.0–34.0)
MCHC: 33.1 g/dL (ref 30.0–36.0)
MCV: 100.3 fL — AB (ref 78.0–100.0)
Monocytes Absolute: 1.3 10*3/uL — ABNORMAL HIGH (ref 0.1–1.0)
Monocytes Relative: 19 % — ABNORMAL HIGH (ref 3–12)
Neutro Abs: 4.5 10*3/uL (ref 1.7–7.7)
Neutrophils Relative %: 64 % (ref 43–77)
Platelets: 180 10*3/uL (ref 150–400)
RBC: 3.77 MIL/uL — ABNORMAL LOW (ref 3.87–5.11)
RDW: 19 % — AB (ref 11.5–15.5)
WBC: 7 10*3/uL (ref 4.0–10.5)

## 2014-11-10 LAB — COMPREHENSIVE METABOLIC PANEL
ALT: 126 U/L — AB (ref 14–54)
AST: 102 U/L — ABNORMAL HIGH (ref 15–41)
Albumin: 3.1 g/dL — ABNORMAL LOW (ref 3.5–5.0)
Alkaline Phosphatase: 373 U/L — ABNORMAL HIGH (ref 38–126)
Anion gap: 10 (ref 5–15)
BILIRUBIN TOTAL: 0.7 mg/dL (ref 0.3–1.2)
BUN: 13 mg/dL (ref 6–20)
CALCIUM: 8.8 mg/dL — AB (ref 8.9–10.3)
CO2: 25 mmol/L (ref 22–32)
Chloride: 105 mmol/L (ref 101–111)
Creatinine, Ser: 0.69 mg/dL (ref 0.44–1.00)
GLUCOSE: 74 mg/dL (ref 65–99)
Potassium: 3.6 mmol/L (ref 3.5–5.1)
SODIUM: 140 mmol/L (ref 135–145)
Total Protein: 6.1 g/dL — ABNORMAL LOW (ref 6.5–8.1)

## 2014-11-10 LAB — URINALYSIS, ROUTINE W REFLEX MICROSCOPIC
BILIRUBIN URINE: NEGATIVE
Glucose, UA: NEGATIVE mg/dL
HGB URINE DIPSTICK: NEGATIVE
KETONES UR: NEGATIVE mg/dL
Leukocytes, UA: NEGATIVE
Nitrite: NEGATIVE
Protein, ur: NEGATIVE mg/dL
Specific Gravity, Urine: >1.046 — ABNORMAL HIGH (ref 1.005–1.030)
UROBILINOGEN UA: 0.2 mg/dL (ref 0.0–1.0)
pH: 6.5 (ref 5.0–8.0)

## 2014-11-10 LAB — MAGNESIUM: MAGNESIUM: 1.9 mg/dL (ref 1.7–2.4)

## 2014-11-10 LAB — PHOSPHORUS: PHOSPHORUS: 3.2 mg/dL (ref 2.5–4.6)

## 2014-11-10 MED ORDER — SODIUM CHLORIDE 0.9 % IV BOLUS (SEPSIS)
1000.0000 mL | Freq: Once | INTRAVENOUS | Status: AC
  Administered 2014-11-10: 1000 mL via INTRAVENOUS

## 2014-11-10 MED ORDER — FLUCONAZOLE 100 MG PO TABS
100.0000 mg | ORAL_TABLET | Freq: Every day | ORAL | Status: DC
  Administered 2014-11-10 – 2014-11-14 (×5): 100 mg via ORAL
  Filled 2014-11-10 (×5): qty 1

## 2014-11-10 MED ORDER — DEXAMETHASONE 2 MG PO TABS
2.0000 mg | ORAL_TABLET | Freq: Every day | ORAL | Status: DC
  Administered 2014-11-10 – 2014-11-14 (×5): 2 mg via ORAL
  Filled 2014-11-10 (×6): qty 1

## 2014-11-10 MED ORDER — HYDROMORPHONE HCL 2 MG PO TABS
1.0000 mg | ORAL_TABLET | ORAL | Status: DC | PRN
  Administered 2014-11-11 – 2014-11-12 (×5): 1 mg via ORAL
  Filled 2014-11-10 (×5): qty 1

## 2014-11-10 MED ORDER — ONDANSETRON HCL 4 MG/2ML IJ SOLN
4.0000 mg | Freq: Four times a day (QID) | INTRAMUSCULAR | Status: DC | PRN
  Administered 2014-11-13 (×2): 4 mg via INTRAVENOUS
  Filled 2014-11-10 (×2): qty 2

## 2014-11-10 MED ORDER — ADULT MULTIVITAMIN W/MINERALS CH
1.0000 | ORAL_TABLET | Freq: Every day | ORAL | Status: DC
  Administered 2014-11-10 – 2014-11-14 (×5): 1 via ORAL
  Filled 2014-11-10 (×5): qty 1

## 2014-11-10 MED ORDER — ACYCLOVIR 400 MG PO TABS
400.0000 mg | ORAL_TABLET | Freq: Two times a day (BID) | ORAL | Status: DC
  Administered 2014-11-10 – 2014-11-14 (×8): 400 mg via ORAL
  Filled 2014-11-10 (×8): qty 1

## 2014-11-10 MED ORDER — LEVETIRACETAM 500 MG PO TABS
1000.0000 mg | ORAL_TABLET | Freq: Two times a day (BID) | ORAL | Status: DC
  Administered 2014-11-10 – 2014-11-14 (×8): 1000 mg via ORAL
  Filled 2014-11-10 (×9): qty 2

## 2014-11-10 MED ORDER — DOCUSATE SODIUM 100 MG PO CAPS
100.0000 mg | ORAL_CAPSULE | Freq: Every day | ORAL | Status: DC
  Administered 2014-11-11 – 2014-11-12 (×2): 100 mg via ORAL
  Filled 2014-11-10 (×3): qty 1

## 2014-11-10 MED ORDER — ONDANSETRON HCL 4 MG PO TABS
4.0000 mg | ORAL_TABLET | Freq: Four times a day (QID) | ORAL | Status: DC | PRN
  Administered 2014-11-12 – 2014-11-13 (×2): 4 mg via ORAL
  Filled 2014-11-10 (×2): qty 1

## 2014-11-10 MED ORDER — ONDANSETRON HCL 4 MG/2ML IJ SOLN
4.0000 mg | Freq: Once | INTRAMUSCULAR | Status: AC
  Administered 2014-11-10: 4 mg via INTRAVENOUS
  Filled 2014-11-10: qty 2

## 2014-11-10 MED ORDER — IOHEXOL 300 MG/ML  SOLN
50.0000 mL | Freq: Once | INTRAMUSCULAR | Status: AC | PRN
Start: 2014-11-10 — End: 2014-11-10
  Administered 2014-11-10: 50 mL via ORAL

## 2014-11-10 MED ORDER — IOHEXOL 300 MG/ML  SOLN
100.0000 mL | Freq: Once | INTRAMUSCULAR | Status: AC | PRN
  Administered 2014-11-10: 100 mL via INTRAVENOUS

## 2014-11-10 MED ORDER — PHENOL 1.4 % MT LIQD
1.0000 | Freq: Four times a day (QID) | OROMUCOSAL | Status: DC | PRN
  Filled 2014-11-10: qty 177

## 2014-11-10 MED ORDER — SODIUM CHLORIDE 0.9 % IJ SOLN: 3.0000 mL | Freq: Two times a day (BID) | INTRAMUSCULAR | Status: DC

## 2014-11-10 MED ORDER — TRAZODONE HCL 50 MG PO TABS
50.0000 mg | ORAL_TABLET | Freq: Every evening | ORAL | Status: DC | PRN
  Administered 2014-11-10 – 2014-11-13 (×3): 100 mg via ORAL
  Filled 2014-11-10 (×3): qty 2

## 2014-11-10 MED ORDER — ENOXAPARIN SODIUM 100 MG/ML ~~LOC~~ SOLN
95.0000 mg | SUBCUTANEOUS | Status: DC
  Administered 2014-11-10 – 2014-11-13 (×4): 95 mg via SUBCUTANEOUS
  Filled 2014-11-10 (×4): qty 1

## 2014-11-10 MED ORDER — GABAPENTIN 100 MG PO CAPS
100.0000 mg | ORAL_CAPSULE | Freq: Three times a day (TID) | ORAL | Status: DC
  Administered 2014-11-10 – 2014-11-14 (×12): 100 mg via ORAL
  Filled 2014-11-10 (×12): qty 1

## 2014-11-10 MED ORDER — KCL IN DEXTROSE-NACL 20-5-0.45 MEQ/L-%-% IV SOLN
INTRAVENOUS | Status: DC
  Administered 2014-11-10 – 2014-11-13 (×10): via INTRAVENOUS
  Administered 2014-11-14: 125 mL/h via INTRAVENOUS
  Administered 2014-11-14: 01:00:00 via INTRAVENOUS
  Filled 2014-11-10 (×14): qty 1000

## 2014-11-10 MED ORDER — SODIUM CHLORIDE 0.9 % IV BOLUS (SEPSIS): 500.0000 mL | Freq: Once | INTRAVENOUS | Status: DC

## 2014-11-10 MED ORDER — ACETAMINOPHEN 325 MG PO TABS
650.0000 mg | ORAL_TABLET | Freq: Once | ORAL | Status: AC
  Administered 2014-11-10: 650 mg via ORAL
  Filled 2014-11-10: qty 2

## 2014-11-10 MED ORDER — LORATADINE 10 MG PO TABS
10.0000 mg | ORAL_TABLET | Freq: Every day | ORAL | Status: DC
  Administered 2014-11-11 – 2014-11-14 (×4): 10 mg via ORAL
  Filled 2014-11-10 (×4): qty 1

## 2014-11-10 MED ORDER — HYDROMORPHONE HCL 1 MG/ML IJ SOLN
1.0000 mg | INTRAMUSCULAR | Status: DC | PRN
  Administered 2014-11-10 (×3): 1 mg via INTRAVENOUS
  Filled 2014-11-10 (×3): qty 1

## 2014-11-10 NOTE — Progress Notes (Signed)
Utilization Review completed.  Paislee Szatkowski RN CM  

## 2014-11-10 NOTE — Progress Notes (Signed)
Location/Histology of Brain Tumor: 2 mm lesion in the right thalamus  Patient presented with symptoms of:  headache  Past or anticipated interventions, if any, per neurosurgery: no  Past or anticipated interventions, if any, per medical oncology:  (1) right liver lobe biopsy 01/21/2013 confirms metastatic adenocarcinoma; scans show involvement of the liver, lungs, and likely bone.  (2) enrolled in [facility] study [id number] (docetaxel + oral gamma secretase inibitor [id number]), received one cycle starting 02/04/2013 but withdrew because of poor tolerance despite treatment interruptions and decreased dosing of the oral component  (3) chest CT in early November 2014 was compared with studies in August 2014 and showed interval progression of metastatic breast cancer, with an enlarging primary right breast mass, enlarging right axillary lymph nodes, enlarging pulmonary nodules and new/enlarging hepatic metastases.  (4) Abraxane started 03/19/2013, given day 1 and day 8 of each 21 day cycle, stopped with the day 1 cycle 3 dose (04/29/2013) because of local progression of disease  (5) cyclophosphamide and doxorubicin started 05/20/2013, given in dose dense fashion with Neulasta support on day 2. Completed 4 cycles 07/03/2013, with the final cycle dose reduced 15% because of an episode of febrile neutropenia after cycle 3. Restaging studies 07/15/2013 showed partial response.  (6) started capecitabine April 2015, 1.5 g by mouth twice a day, 7 days on 7 days off--discontinued October 2015 with progression  (7) s/p Right simple mastectomy 02/17/2014 to optimize local control, the pathology ([facility] [phone] [phone]) showing an invasive ductal carcinoma measuring 4.8 cm, grade 3, with close but negative margins and evidence of treatment response  (a) nodule on right mastectomy scar noted 04/03/2014, removed 04/14/2014 (b) new nodule noted on R chest scar 05/19/2014  Dose of Decadron, if  applicable: Decadron 2 mg daily  Recent neurologic symptoms, if any:   Seizures: no; taking Keppra 1000 mg bid  Headaches: yes  Nausea: no  Dizziness/ataxia: no  Difficulty with hand coordination: no  Focal numbness/weakness: no  Visual deficits/changes: no  Confusion/Memory deficits: no    SAFETY ISSUES: Prior radiation? Yes; radiation to R chest wall completed 07/22/2014 then, multiple brain metastases noted on brain MRI 02/26/2014--s/p whole brain irradiation completed 03/17/2014 (a) repeat brain MRI 05/12/2014 shows favorable response (b) repeat brain MRI 07/15/2014 shows questionable growth/ stable disease  Pacemaker/ICD? no Possible current pregnancy? no Is the patient on methotrexate? no  Additional Complaints / other details: 64 year old female. Married. Referred for consideration of SRS treatment.

## 2014-11-10 NOTE — Progress Notes (Signed)
COURTESY NOTE:  Demetress'S SCANS SHOW PROGRESSION, SO WE WILL CHANGE HER TREATMENT (WILL DISCUSS AS OUTPATIENT). HOWEVER THE DEGREE OF PROGRESSION DOES NOT EXPLAIN HER ACUTE SYMPTOMS. sHE MAY INDEED HAVE A VIRAL ILLNESS AS SHE BELIEVES (HER DAUGHTER WAS ILL AND VISITED). sUPPORTIVE THERAPY SHOULD RESOLVE THE ISSUE IF A VIRAL ILLNESS IS THE CASE  sHE IS SCHEDULED FOR SRS THERAPY TOMORROW-- I HAVE ALERTED dR MANNING OF HER ADMISSION  APPRECIATE YOUR CARE TO THIS PATIENT-- WILL FOLLOW WITH YOU

## 2014-11-10 NOTE — ED Notes (Signed)
Attempted 2 times to get urine from pt. Tried bedpan and female urinal. No luck. Pt states she feels like she needs to go but could not.

## 2014-11-10 NOTE — H&P (Signed)
[facility] History and Physical  [name] XHB:[id number] DOB: 15-Feb-1951 DOA: 11/10/2014  Referring physician: Dr. Colin Rhein PCP: [name], MD   Chief Complaint: weakness  HPI: Carrie Berry is a 64 y.o. female  Presenting with several days complaint of weakness. Reportedly has been getting worse. Patient has had 1 bout of emesis but denies any diarrhea. She was around sick contact her daughter that had diarrhea and emesis. It was suspected that she had a viral gastroenteritis. The patient denies any worsening abdominal discomfort. Much of the history is obtained by family members per patient preference.  We were consulted for further medical evaluation recommendations. Per my discussion with the ED physician currently he is suspecting a viral gastroenteritis and has not started antibiotics.   Review of Systems:  Unable to fully obtain given limited patient cooperation  Past Medical History  Diagnosis Date  . Recurrent sinus infections   . Arthritis   . PONV (postoperative nausea and vomiting)   . GERD (gastroesophageal reflux disease)   . 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 Anxiety     no problems at present  . Breast cancer   . 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 Radiation 07/16/14-07/22/14    Right chest wall nodule 20 Gy in 5 fractions  . Brain cancer     breast ca with brain mets   Past Surgical History  Procedure Laterality Date  . Tubal ligation  1985  . Portacath placement N/A 01/10/2013    Procedure: ULTRASOUND GUIDED PORT-A-CATH INSERTION WITH FLUOROSCOPY;  Surgeon: [name], MD;  Location: [place];  Service: General;  Laterality: N/A;  . Esophagogastroduodenoscopy N/A 08/19/2013    Procedure: ESOPHAGOGASTRODUODENOSCOPY (EGD);  Surgeon: Garlan Fair, MD;  Location: [facility];  Service: Endoscopy;  Laterality: N/A;  . Total mastectomy Right 02/17/2014    Procedure: PALLIATIVE  RIGHT  MASTECTOMY;  Surgeon: [name], MD;  Location: [place];  Service: General;  Laterality: Right;   Social History:  reports that she has never smoked. She has never used smokeless tobacco. She reports that she does not drink alcohol or use illicit drugs.  Allergies  Allergen Reactions  . Codeine Nausea And Vomiting    "violently ill"  . Penicillins Rash    Childhood reaction -    Family History  Problem Relation Age of Onset  . Bladder Cancer Father   . Colon cancer Maternal Grandmother     Prior to Admission medications   Medication Sig Start Date End Date Taking? Authorizing Provider  acyclovir (ZOVIRAX) 400 MG tablet Take 1 tablet (400 mg total) by mouth 2 (two) times daily. 07/22/14  Yes Chauncey Cruel, MD  Benzocaine (ORAL GEL ANESTHETIC MT) Use as directed 1 application in the mouth or throat 4 (four) times daily as needed (mouth pain).   Yes Historical Provider, MD  cetirizine (ZYRTEC) 10 MG tablet Take 10 mg by mouth daily.   Yes Historical Provider, MD  cholecalciferol (VITAMIN D) 1000 UNITS tablet Take 1,000 Units by mouth daily.   Yes Historical Provider, MD  [facility] GAS RELIEF 80 MG chewable tablet CHEW 1 TABLET BY MOUTH EVERY 6 (SIX) HOURS AS NEEDED FOR FLATULENCE. 09/04/14  Yes Chauncey Cruel, MD  dexamethasone (DECADRON) 2 MG tablet Take 2 mg by mouth daily.  08/02/14  Yes Historical Provider, MD  diphenoxylate-atropine (LOMOTIL) 2.5-0.025 MG per tablet Take 1 tablet by mouth 4 (four) times daily as needed  for diarrhea or loose stools. Patient taking differently: Take 1 tablet by mouth 4 (four) times daily as needed for diarrhea or loose stools.  07/22/14  Yes Chauncey Cruel, MD  docusate sodium 100 MG CAPS Take 100 mg by mouth daily. 03/10/14  Yes Robbie Lis, MD  enoxaparin (LOVENOX) 100 MG/ML injection INJECT 0.95MLS (95MG  TOTAL) INTO THE SKIN DAILY 10/26/14  Yes Chauncey Cruel, MD  fluconazole (DIFLUCAN) 100 MG tablet Take 100 mg by mouth daily.   Yes Historical  Provider, MD  gabapentin (NEURONTIN) 100 MG capsule TAKE 1 CAPSULE BY MOUTH 3 (THREE) TIMES DAILY. 09/25/14  Yes Chauncey Cruel, MD  HYDROmorphone (DILAUDID) 2 MG tablet Take 0.5 tablets (1 mg total) by mouth every 4 (four) hours as needed. 06/23/14  Yes Chauncey Cruel, MD  levETIRAcetam (KEPPRA) 1000 MG tablet TAKE 1 TABLET (1,000 MG TOTAL) BY MOUTH EVERY 12 (TWELVE) HOURS. 10/13/14  Yes Chauncey Cruel, MD  lidocaine-prilocaine (EMLA) cream Apply to affected area once Patient taking differently: Apply 1 application topically daily as needed (prior to port being access). Apply to affected area once 05/19/14  Yes Chauncey Cruel, MD  methylphenidate (RITALIN) 5 MG tablet Take 1 tablet (5 mg total) by mouth 2 (two) times daily with breakfast and lunch. 10/21/14  Yes Chauncey Cruel, MD  Multiple Vitamin (MULTIVITAMIN WITH MINERALS) TABS tablet Take 1 tablet by mouth daily.   Yes Historical Provider, MD  omeprazole (PRILOSEC) 20 MG capsule TAKE 1 CAPSULE (20 MG TOTAL) BY MOUTH 2 (TWO) TIMES DAILY BEFORE A MEAL. 07/21/14  Yes Chauncey Cruel, MD  Phenol (SORE THROAT SPRAY MT) Use as directed 4 sprays in the mouth or throat 4 (four) times daily as needed (mouth pain).   Yes Historical Provider, MD  prochlorperazine (COMPAZINE) 10 MG tablet TAKE 1 TABLET (10 MG TOTAL) BY MOUTH EVERY 6 (SIX) HOURS AS NEEDED (NAUSEA OR VOMITING). 06/09/14  Yes Chauncey Cruel, MD  RA KRILL OIL PO Take 1 capsule by mouth daily.   Yes Historical Provider, MD  traZODone (DESYREL) 50 MG tablet TAKE 1 TO 2 TABLETS BY MOUTH AT BEDTIME FOR SLEEP Patient taking differently: TAKE 2 TABLETS BY MOUTH AT BEDTIME FOR SLEEP 10/30/14  Yes Chauncey Cruel, MD  mirtazapine (REMERON) 30 MG tablet Take 0.5 tablets (15 mg total) by mouth at bedtime. Patient not taking: Reported on 11/10/2014 08/20/14   Chauncey Cruel, MD   Physical Exam: Filed Vitals:   11/10/14 0919 11/10/14 1146 11/10/14 1512 11/10/14 1526  BP: 114/67 110/67 113/69    Pulse: 102 111 121   Temp: 100.9 F (38.3 C)  99.3 F (37.4 C) 101.4 F (38.6 C)  TempSrc: Oral  Oral   Resp: 18 18 20    SpO2: 95% 100% 98%     Wt Readings from Last 3 Encounters:  11/02/14 58.968 kg (130 lb)  10/30/14 63.504 kg (140 lb)  10/15/14 64.921 kg (143 lb 2 oz)    General:  Pt in nad, alert and awake with limited responses to examiners questions Eyes: PERRL, normal lids, irises & conjunctiva ENT: grossly normal hearing, lips & tongue, dry mucous membranes Neck: no LAD, masses or thyromegaly Cardiovascular: RRR, no m/r/g. No LE edema. Respiratory: CTA bilaterally, no w/r/r. Normal respiratory effort. Abdomen: soft, nt, mild distension Skin: dry, warm Musculoskeletal: grossly normal tone BUE/BLE Psychiatric: grossly normal mood and affect, speech fluent and appropriate Neurologic: Patient has no facial asymmetry, answers questions appropriately  Labs on Admission:  Basic Metabolic Panel:  Recent Labs Lab 11/10/14 1152  NA 140  K 3.6  CL 105  CO2 25  GLUCOSE 74  BUN 13  CREATININE 0.69  CALCIUM 8.8*   Liver Function Tests:  Recent Labs Lab 11/10/14 1152  AST 102*  ALT 126*  ALKPHOS 373*  BILITOT 0.7  PROT 6.1*  ALBUMIN 3.1*   No results for input(s): LIPASE, AMYLASE in the last 168 hours. No results for input(s): AMMONIA in the last 168 hours. CBC:  Recent Labs Lab 11/10/14 1152  WBC 7.0  NEUTROABS 4.5  HGB 12.5  HCT 37.8  MCV 100.3*  PLT 180   Cardiac Enzymes: No results for input(s): CKTOTAL, CKMB, CKMBINDEX, TROPONINI in the last 168 hours.  BNP (last 3 results) No results for input(s): BNP in the last 8760 hours.  ProBNP (last 3 results) No results for input(s): PROBNP in the last 8760 hours.  CBG: No results for input(s): GLUCAP in the last 168 hours.  Radiological Exams on Admission: Dg Chest 2 View  11/10/2014   CLINICAL DATA:  Nausea and weakness  EXAM: CHEST - 2 VIEW  COMPARISON:  08/27/2014  FINDINGS:  Right-sided chest wall port is again noted. The cardiac shadow is stable. Persistent left basilar changes are seen laterally. No new focal infiltrate is seen. The known pulmonary nodules are not well visualized on this exam. This is predominately related to their size. No other focal abnormality is seen.  IMPRESSION: Stable left basilar changes.   Electronically Signed   By: Inez Catalina M.D.   On: 11/10/2014 11:17   Ct Head W Wo Contrast  11/10/2014   CLINICAL DATA:  Nausea, vomiting, and weakness beginning this morning. History of metastatic breast cancer.  EXAM: CT HEAD WITHOUT AND WITH CONTRAST  TECHNIQUE: Contiguous axial images were obtained from the base of the skull through the vertex without and with intravenous contrast  CONTRAST:  192mL OMNIPAQUE IOHEXOL 300 MG/ML  SOLN  COMPARISON:  Brain MRI 10/30/2014  FINDINGS: There is no evidence of acute large territory infarct, acute intracranial hemorrhage, midline shift, or extra-axial fluid collection. There is mild generalized cerebral atrophy. Extensive hypodensities in the cerebral white matter correspond to the T2 hyperintensities on recent MRI and are nonspecific but may reflect postradiation changes. 10 mm ring-enhancing lesion in the right cerebellum is unchanged, as is a 6 mm lesion in the right parietal lobe. Other, punctate enhancing lesions on the prior brain MRI are not well seen on this CT.  Orbits are unremarkable. Small bilateral mastoid effusions are noted. Visualized mastoid air cells are clear.  IMPRESSION: 1. No evidence of acute intracranial abnormality. 2. Unchanged, small right parietal and right cerebellar metastases. No new metastases or mass effect or identified.   Electronically Signed   By: Logan Bores   On: 11/10/2014 13:16   Ct Abdomen Pelvis W Contrast  11/10/2014   CLINICAL DATA:  Breast cancer diagnosed 2013 with metastases. Last chemotherapy earlier this month. Nausea, vomiting, weakness. Abdominal pain.  EXAM: CT ABDOMEN  AND PELVIS WITH CONTRAST  TECHNIQUE: Multidetector CT imaging of the abdomen and pelvis was performed using the standard protocol following bolus administration of intravenous contrast.  CONTRAST:  179mL OMNIPAQUE IOHEXOL 300 MG/ML  SOLN  COMPARISON:  MRI 11/02/2014.  CT 08/27/2014.  FINDINGS: Enlarging bilateral lower lobe pulmonary nodules. 11 mm right lower lobe pulmonary nodule on image 8. Adjacent nodules in the left lower lobe on image 11 each measure 14  mm. Trace right pleural effusion. Heart is normal size.  Small hypodensities scattered throughout the liver, best seen in the left hepatic lobe on image 28, 31, 34 and in the right hepatic lobe on image 32, 35. These are new or enlarging since prior study concerning for worsening metastases. Right hepatic biliary ductal dilatation is again noted, similar to prior study.  Pancreas, spleen, adrenals and kidneys are unremarkable.  Stomach, large and small bowel are unremarkable. Uterus, adnexae and urinary bladder unremarkable. Free fluid, free air or adenopathy. No acute bony abnormality.  IMPRESSION: Enlarging bilateral lower lobe pulmonary nodules compatible with worsening metastatic disease.  New or enlarging low-density lesions within the liver compatible with worsening metastatic disease.   Electronically Signed   By: Rolm Baptise M.D.   On: 11/10/2014 14:15    EKG: Independently reviewed. Rhythm with no ST elevations or depressions  Assessment/Plan Active Problems:   Generalized weakness -Most likely secondary to viral gastroenteritis and dehydration. - We'll obtain physical therapy evaluation next am    Viral gastroenteritis -We'll obtain GI pathogen panel but given history patient was exposed to sick contact with suspected viral gastroenteritis.  DVT -Continue Lovenox  Metastatic breast cancer -Patient was to get radiation therapy to metastatic brain lesion. We'll place radiation oncology consult   Code Status: full DVT Prophylaxis:  on Lovenox Family Communication: discussed with family at bedside Disposition Plan: Telemetry  Time spent: > 55 minutes  [name] [facility] Pager [phone]

## 2014-11-10 NOTE — ED Provider Notes (Signed)
CSN: [id number]     Arrival date & time 11/10/14  0919 History   First MD Initiated Contact with Patient 11/10/14 0932     Chief Complaint  Patient presents with  . Nausea  . Emesis  . Weakness     (Consider location/radiation/quality/duration/timing/severity/associated sxs/prior Treatment) Patient is a 64 y.o. female presenting with general illness.  Illness Location:  Generalized Quality:  Weakness Severity:  Moderate Onset quality:  Gradual Duration:  1 day Timing:  Constant Progression:  Worsening Chronicity:  Recurrent Context:  Ho st4 breast ca with liver and brain mets, currently on chemo Relieved by:  Nothing Worsened by:  Nothing Associated symptoms: abdominal pain (chronically, intermittent)   Associated symptoms: no chest pain, no cough, no fever, no nausea and no vomiting     Past Medical History  Diagnosis Date  . Recurrent sinus infections   . Arthritis   . PONV (postoperative nausea and vomiting)   . GERD (gastroesophageal reflux disease)   . 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 Anxiety     no problems at present  . Breast cancer   . 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 Radiation 07/16/14-07/22/14    Right chest wall nodule 20 Gy in 5 fractions  . Brain cancer     breast ca with brain mets   Past Surgical History  Procedure Laterality Date  . Tubal ligation  1985  . Portacath placement N/A 01/10/2013    Procedure: ULTRASOUND GUIDED PORT-A-CATH INSERTION WITH FLUOROSCOPY;  Surgeon: [name], MD;  Location: [place];  Service: General;  Laterality: N/A;  . Esophagogastroduodenoscopy N/A 08/19/2013    Procedure: ESOPHAGOGASTRODUODENOSCOPY (EGD);  Surgeon: Garlan Fair, MD;  Location: [facility];  Service: Endoscopy;  Laterality: N/A;  . Total mastectomy Right 02/17/2014    Procedure: PALLIATIVE  RIGHT MASTECTOMY;  Surgeon: [name], MD;  Location: [place];  Service: General;  Laterality:  Right;   Family History  Problem Relation Age of Onset  . Bladder Cancer Father   . Colon cancer Maternal Grandmother    History  Substance Use Topics  . Smoking status: Never Smoker   . Smokeless tobacco: Never Used  . Alcohol Use: No   OB History    No data available     Review of Systems  Constitutional: Negative for fever.  Respiratory: Negative for cough.   Cardiovascular: Negative for chest pain.  Gastrointestinal: Positive for abdominal pain (chronically, intermittent). Negative for nausea and vomiting.  All other systems reviewed and are negative.     Allergies  Codeine and Penicillins  Home Medications   Prior to Admission medications   Medication Sig Start Date End Date Taking? Authorizing Provider  acyclovir (ZOVIRAX) 400 MG tablet Take 1 tablet (400 mg total) by mouth 2 (two) times daily. 07/22/14  Yes Chauncey Cruel, MD  Benzocaine (ORAL GEL ANESTHETIC MT) Use as directed 1 application in the mouth or throat 4 (four) times daily as needed (mouth pain).   Yes Historical Provider, MD  cetirizine (ZYRTEC) 10 MG tablet Take 10 mg by mouth daily.   Yes Historical Provider, MD  cholecalciferol (VITAMIN D) 1000 UNITS tablet Take 1,000 Units by mouth daily.   Yes Historical Provider, MD  [employer] GAS RELIEF 80 MG chewable tablet CHEW 1 TABLET BY MOUTH EVERY 6 (SIX) HOURS AS NEEDED FOR FLATULENCE. 09/04/14  Yes Chauncey Cruel, MD  dexamethasone (DECADRON) 2 MG tablet Take 2 mg by  mouth daily.  08/02/14  Yes Historical Provider, MD  diphenoxylate-atropine (LOMOTIL) 2.5-0.025 MG per tablet Take 1 tablet by mouth 4 (four) times daily as needed for diarrhea or loose stools. Patient taking differently: Take 1 tablet by mouth 4 (four) times daily as needed for diarrhea or loose stools.  07/22/14  Yes Chauncey Cruel, MD  docusate sodium 100 MG CAPS Take 100 mg by mouth daily. 03/10/14  Yes Robbie Lis, MD  enoxaparin (LOVENOX) 100 MG/ML injection INJECT 0.95MLS (95MG  TOTAL)  INTO THE SKIN DAILY 10/26/14  Yes Chauncey Cruel, MD  fluconazole (DIFLUCAN) 100 MG tablet Take 100 mg by mouth daily.   Yes Historical Provider, MD  gabapentin (NEURONTIN) 100 MG capsule TAKE 1 CAPSULE BY MOUTH 3 (THREE) TIMES DAILY. 09/25/14  Yes Chauncey Cruel, MD  HYDROmorphone (DILAUDID) 2 MG tablet Take 0.5 tablets (1 mg total) by mouth every 4 (four) hours as needed. 06/23/14  Yes Chauncey Cruel, MD  levETIRAcetam (KEPPRA) 1000 MG tablet TAKE 1 TABLET (1,000 MG TOTAL) BY MOUTH EVERY 12 (TWELVE) HOURS. 10/13/14  Yes Chauncey Cruel, MD  lidocaine-prilocaine (EMLA) cream Apply to affected area once Patient taking differently: Apply 1 application topically daily as needed (prior to port being access). Apply to affected area once 05/19/14  Yes Chauncey Cruel, MD  methylphenidate (RITALIN) 5 MG tablet Take 1 tablet (5 mg total) by mouth 2 (two) times daily with breakfast and lunch. 10/21/14  Yes Chauncey Cruel, MD  Multiple Vitamin (MULTIVITAMIN WITH MINERALS) TABS tablet Take 1 tablet by mouth daily.   Yes Historical Provider, MD  omeprazole (PRILOSEC) 20 MG capsule TAKE 1 CAPSULE (20 MG TOTAL) BY MOUTH 2 (TWO) TIMES DAILY BEFORE A MEAL. 07/21/14  Yes Chauncey Cruel, MD  Phenol (SORE THROAT SPRAY MT) Use as directed 4 sprays in the mouth or throat 4 (four) times daily as needed (mouth pain).   Yes Historical Provider, MD  prochlorperazine (COMPAZINE) 10 MG tablet TAKE 1 TABLET (10 MG TOTAL) BY MOUTH EVERY 6 (SIX) HOURS AS NEEDED (NAUSEA OR VOMITING). 06/09/14  Yes Chauncey Cruel, MD  RA KRILL OIL PO Take 1 capsule by mouth daily.   Yes Historical Provider, MD  traZODone (DESYREL) 50 MG tablet TAKE 1 TO 2 TABLETS BY MOUTH AT BEDTIME FOR SLEEP Patient taking differently: TAKE 2 TABLETS BY MOUTH AT BEDTIME FOR SLEEP 10/30/14  Yes Chauncey Cruel, MD  mirtazapine (REMERON) 30 MG tablet Take 0.5 tablets (15 mg total) by mouth at bedtime. Patient not taking: Reported on 11/10/2014 08/20/14    Chauncey Cruel, MD   BP 108/64 mmHg  Pulse 76  Temp(Src) 99.1 F (37.3 C) (Oral)  Resp 18  Ht 5\' 3"  (1.6 m)  Wt 141 lb 8.6 oz (64.2 kg)  BMI 25.08 kg/m2  SpO2 98% Physical Exam  Constitutional: She is oriented to person, place, and time. She appears well-developed and well-nourished.  HENT:  Head: Normocephalic and atraumatic.  Right Ear: External ear normal.  Left Ear: External ear normal.  Eyes: Conjunctivae and EOM are normal. Pupils are equal, round, and reactive to light.  Neck: Normal range of motion. Neck supple.  Cardiovascular: Normal rate, regular rhythm, normal heart sounds and intact distal pulses.   Pulmonary/Chest: Effort normal and breath sounds normal.  Abdominal: Soft. Bowel sounds are normal. There is tenderness in the right upper quadrant.  Musculoskeletal: Normal range of motion.  Neurological: She is alert and oriented to person, place, and  time.  Skin: Skin is warm and dry.  Vitals reviewed.   ED Course  Procedures (including critical care time) Labs Review Labs Reviewed  CBC WITH DIFFERENTIAL/PLATELET - Abnormal; Notable for the following:    RBC 3.77 (*)    MCV 100.3 (*)    RDW 19.0 (*)    Monocytes Relative 19 (*)    Monocytes Absolute 1.3 (*)    All other components within normal limits  COMPREHENSIVE METABOLIC PANEL - Abnormal; Notable for the following:    Calcium 8.8 (*)    Total Protein 6.1 (*)    Albumin 3.1 (*)    AST 102 (*)    ALT 126 (*)    Alkaline Phosphatase 373 (*)    All other components within normal limits  URINALYSIS, ROUTINE W REFLEX MICROSCOPIC (NOT AT [facility]) - Abnormal; Notable for the following:    Color, Urine AMBER (*)    Specific Gravity, Urine >1.046 (*)    All other components within normal limits  CULTURE, BLOOD (ROUTINE X 2)  CULTURE, BLOOD (ROUTINE X 2)  PHOSPHORUS  MAGNESIUM  GI PATHOGEN PANEL BY PCR, STOOL  BASIC METABOLIC PANEL  CBC    Imaging Review Dg Chest 2 View  11/10/2014   CLINICAL DATA:   Nausea and weakness  EXAM: CHEST - 2 VIEW  COMPARISON:  08/27/2014  FINDINGS: Right-sided chest wall port is again noted. The cardiac shadow is stable. Persistent left basilar changes are seen laterally. No new focal infiltrate is seen. The known pulmonary nodules are not well visualized on this exam. This is predominately related to their size. No other focal abnormality is seen.  IMPRESSION: Stable left basilar changes.   Electronically Signed   By: Inez Catalina M.D.   On: 11/10/2014 11:17   Ct Head W Wo Contrast  11/10/2014   CLINICAL DATA:  Nausea, vomiting, and weakness beginning this morning. History of metastatic breast cancer.  EXAM: CT HEAD WITHOUT AND WITH CONTRAST  TECHNIQUE: Contiguous axial images were obtained from the base of the skull through the vertex without and with intravenous contrast  CONTRAST:  169mL OMNIPAQUE IOHEXOL 300 MG/ML  SOLN  COMPARISON:  Brain MRI 10/30/2014  FINDINGS: There is no evidence of acute large territory infarct, acute intracranial hemorrhage, midline shift, or extra-axial fluid collection. There is mild generalized cerebral atrophy. Extensive hypodensities in the cerebral white matter correspond to the T2 hyperintensities on recent MRI and are nonspecific but may reflect postradiation changes. 10 mm ring-enhancing lesion in the right cerebellum is unchanged, as is a 6 mm lesion in the right parietal lobe. Other, punctate enhancing lesions on the prior brain MRI are not well seen on this CT.  Orbits are unremarkable. Small bilateral mastoid effusions are noted. Visualized mastoid air cells are clear.  IMPRESSION: 1. No evidence of acute intracranial abnormality. 2. Unchanged, small right parietal and right cerebellar metastases. No new metastases or mass effect or identified.   Electronically Signed   By: Logan Bores   On: 11/10/2014 13:16   Ct Abdomen Pelvis W Contrast  11/10/2014   CLINICAL DATA:  Breast cancer diagnosed 2013 with metastases. Last chemotherapy  earlier this month. Nausea, vomiting, weakness. Abdominal pain.  EXAM: CT ABDOMEN AND PELVIS WITH CONTRAST  TECHNIQUE: Multidetector CT imaging of the abdomen and pelvis was performed using the standard protocol following bolus administration of intravenous contrast.  CONTRAST:  121mL OMNIPAQUE IOHEXOL 300 MG/ML  SOLN  COMPARISON:  MRI 11/02/2014.  CT 08/27/2014.  FINDINGS: Enlarging  bilateral lower lobe pulmonary nodules. 11 mm right lower lobe pulmonary nodule on image 8. Adjacent nodules in the left lower lobe on image 11 each measure 14 mm. Trace right pleural effusion. Heart is normal size.  Small hypodensities scattered throughout the liver, best seen in the left hepatic lobe on image 28, 31, 34 and in the right hepatic lobe on image 32, 35. These are new or enlarging since prior study concerning for worsening metastases. Right hepatic biliary ductal dilatation is again noted, similar to prior study.  Pancreas, spleen, adrenals and kidneys are unremarkable.  Stomach, large and small bowel are unremarkable. Uterus, adnexae and urinary bladder unremarkable. Free fluid, free air or adenopathy. No acute bony abnormality.  IMPRESSION: Enlarging bilateral lower lobe pulmonary nodules compatible with worsening metastatic disease.  New or enlarging low-density lesions within the liver compatible with worsening metastatic disease.   Electronically Signed   By: Rolm Baptise M.D.   On: 11/10/2014 14:15     EKG Interpretation   Date/Time:  Tuesday November 10 2014 10:32:29 EDT Ventricular Rate:  94 PR Interval:  156 QRS Duration: 75 QT Interval:  359 QTC Calculation: 449 R Axis:   27 Text Interpretation:  Sinus rhythm Low voltage, precordial leads RSR' in  V1 or V2, right VCD or RVH No significant change since last tracing  Confirmed by Debby Freiberg [phone]) on 11/10/2014 11:13:57 AM      MDM   Final diagnoses:  Abdominal pain, acute    64 y.o. female with pertinent PMH of st 4 breast ca with liver  and brain mets on chemo presents with fever, generalized weakness.  No clear etiology of fever per history or exam, however pt was exposed to gastroenteritis from family member.  Wu as above.  Admitted pt for dehydration.  I have reviewed all laboratory and imaging studies if ordered as above  1. Abdominal pain, acute   2. Breast cancer         Debby Freiberg, MD 11/11/14 [phone]

## 2014-11-10 NOTE — ED Notes (Signed)
Patient wants her port access to collect labs.

## 2014-11-10 NOTE — ED Notes (Signed)
Per EMS: pt brain and breast CA pt, c/o n/v weakness that started this morning. Pt took PO zofran before calling EMS pt feels a little better. Last chemo on 10/15/14

## 2014-11-10 NOTE — ED Notes (Signed)
Patient transported to CT 

## 2014-11-11 ENCOUNTER — Ambulatory Visit
Admit: 2014-11-11 | Discharge: 2014-11-11 | Disposition: A | Discharge status: Home / Self Care | Payer: BC Managed Care – PPO | Attending: Radiation Oncology | Admitting: Radiation Oncology

## 2014-11-11 ENCOUNTER — Ambulatory Visit: Payer: BC Managed Care – PPO

## 2014-11-11 ENCOUNTER — Ambulatory Visit: Payer: BC Managed Care – PPO | Admitting: Radiation Oncology

## 2014-11-11 ENCOUNTER — Ambulatory Visit: Admission: RE | Admit: 2014-11-11 | Payer: BC Managed Care – PPO | Source: Ambulatory Visit

## 2014-11-11 ENCOUNTER — Ambulatory Visit
Admission: RE | Admit: 2014-11-11 | Discharge: 2014-11-11 | Disposition: A | Discharge status: Home / Self Care | Payer: BC Managed Care – PPO | Source: Ambulatory Visit | Attending: Radiation Oncology | Admitting: Radiation Oncology

## 2014-11-11 DIAGNOSIS — C50919 Malignant neoplasm of unspecified site of unspecified female breast: Secondary | ICD-10-CM

## 2014-11-11 DIAGNOSIS — C7931 Secondary malignant neoplasm of brain: Principal | ICD-10-CM

## 2014-11-11 LAB — CBC
HCT: 30.8 % — ABNORMAL LOW (ref 36.0–46.0)
Hemoglobin: 10.3 g/dL — ABNORMAL LOW (ref 12.0–15.0)
MCH: 33.6 pg (ref 26.0–34.0)
MCHC: 33.4 g/dL (ref 30.0–36.0)
MCV: 100.3 fL — ABNORMAL HIGH (ref 78.0–100.0)
PLATELETS: 135 10*3/uL — AB (ref 150–400)
RBC: 3.07 MIL/uL — ABNORMAL LOW (ref 3.87–5.11)
RDW: 18.8 % — AB (ref 11.5–15.5)
WBC: 6.3 10*3/uL (ref 4.0–10.5)

## 2014-11-11 LAB — BASIC METABOLIC PANEL
Anion gap: 5 (ref 5–15)
BUN: 6 mg/dL (ref 6–20)
CALCIUM: 8.3 mg/dL — AB (ref 8.9–10.3)
CO2: 26 mmol/L (ref 22–32)
Chloride: 108 mmol/L (ref 101–111)
Creatinine, Ser: 0.47 mg/dL (ref 0.44–1.00)
GFR calc Af Amer: >60 mL/min (ref >60)
Glucose, Bld: 113 mg/dL — ABNORMAL HIGH (ref 65–99)
POTASSIUM: 4 mmol/L (ref 3.5–5.1)
SODIUM: 139 mmol/L (ref 135–145)

## 2014-11-11 MED ORDER — SODIUM CHLORIDE 0.9 % IJ SOLN: 10.0000 mL | Freq: Two times a day (BID) | INTRAMUSCULAR | Status: DC

## 2014-11-11 MED ORDER — SODIUM CHLORIDE 0.9 % IJ SOLN
10.0000 mL | INTRAMUSCULAR | Status: DC | PRN
  Administered 2014-11-14 (×2): 10 mL
  Filled 2014-11-11 (×2): qty 40

## 2014-11-11 MED ORDER — ALTEPLASE 2 MG IJ SOLR
2.0000 mg | Freq: Once | INTRAMUSCULAR | Status: AC
  Administered 2014-11-11: 2 mg
  Filled 2014-11-11: qty 2

## 2014-11-11 NOTE — Progress Notes (Signed)
  Radiation Oncology         [phone]) [phone] ________________________________  Name: Carrie Berry  MRN: [id number]  Date: 11/10/2014  DOB: Nov 10, 1950  Chart Note:  I reviewed this patient's most recent findings and wanted to take a minute to document my impression.  Hospital admission noted.  Planned radiation evaluation and possible treatment has been put on hold due to current admission.  We will follow her progress and arrange follow-up as an outpatient after recovery.  The lesion in the right thalamus seen on her recent scan is small and asymptomatic.  We have time to let the patient recover her performance status.  Her case was discussed with neurosurgery and medical oncology.  In all likelihood, we will pursue repeat brain MRI in 2 months and re-evaluate.  ________________________________  Sheral Apley. Tammi Klippel, M.D.

## 2014-11-11 NOTE — Care Management Note (Signed)
Case Management Note  Patient Details  Name: Carrie Berry MRN: [id number] Date of Birth: 11-Oct-1950  Subjective/Objective: Pt admitted with cco Generalized weakness                   Action/Plan:from home   Expected Discharge Date:   (unknown)               Expected Discharge Plan:  Home/Self Care  In-House Referral:     Discharge planning Services  CM Consult  Post Acute Care Choice:    Choice offered to:     DME Arranged:    DME Agency:     HH Arranged:    HH Agency:     Status of Service:  In process, will continue to follow  Medicare Important Message Given:    Date Medicare IM Given:    Medicare IM give by:    Date Additional Medicare IM Given:    Additional Medicare Important Message give by:     If discussed at [place] of Stay Meetings, dates discussed:    Additional CommentsPurcell Mouton, RN 11/11/2014, 3:49 PM

## 2014-11-11 NOTE — Evaluation (Signed)
Physical Therapy Evaluation Patient Details Name: Carrie Berry MRN: [id number] DOB: 1950-11-08 Today's Date: 11/11/2014   History of Present Illness  Presenting with several days complaint of weakness. Reportedly has been getting worse. Marland Kitchen She was around sick contact her daughter that had diarrhea and emesis. It was suspected that she had a viral gastroenteritis. PaTIENT HAS A HISTORY OF LUNG CANCER , BRAIN METS.  Clinical Impression  Patient had incontinence of loose BM, CNA in to assist with wash up. Patient is weak, difficulty with standing. Did not attempt ambulation. Patient will benefit from PT to address problems listed in note below.  Follow Up Recommendations Home health PT;Supervision/Assistance - 24 hour (patient reports planning trip saturday and will not be home)    Equipment Recommendations  None recommended by PT    Recommendations for Other Services       Precautions / Restrictions Precautions Precautions: Fall Precaution Comments: LOOSE BM      Mobility  Bed Mobility Overal bed mobility: Needs Assistance Bed Mobility: Supine to Sit;Sit to Supine     Supine to sit: Min guard Sit to supine: Min assist   General bed mobility comments: assist legs onto bed  Transfers Overall transfer level: Needs assistance   Transfers: Sit to/from Stand;Stand Pivot Transfers Sit to Stand: Mod assist Stand pivot transfers: Mod assist       General transfer comment: bearhug type stand mod assist to power up into standing , then pivot to  [facility] then back to bed,  Ambulation/Gait Ambulation/Gait assistance: Mod assist   Assistive device: 1 person support under arms  Gait Pattern/deviations: Step-to pattern     General Gait Details: bear hug, side steps along the bed.  Stairs            Wheelchair Mobility    Modified Rankin (Stroke Patients Only)       Balance Overall balance assessment: Needs assistance         Standing balance support: During  functional activity;Bilateral upper extremity supported Standing balance-Leahy Scale: Poor                               Pertinent Vitals/Pain Pain Assessment: No/denies pain    Home Living Family/patient expects to be discharged to:: Private residence Living Arrangements: Spouse/significant other Available Help at Discharge: Family Type of Home: House Home Access: Stairs to enter Entrance Stairs-Rails: Psychiatric nurse of Steps: 5 Home Layout: One level Home Equipment: Wheelchair - manual      Prior Function Level of Independence: Needs assistance   Gait / Transfers Assistance Needed: uses a RW,  ADL's / Homemaking Assistance Needed: husband assisting with bathing, dressing, meals, housework  Comments: assistance getting to bathroom     Hand Dominance        Extremity/Trunk Assessment   Upper Extremity Assessment: Generalized weakness           Lower Extremity Assessment: Generalized weakness      Cervical / Trunk Assessment: Normal  Communication   Communication: No difficulties  Cognition Arousal/Alertness: Awake/alert Behavior During Therapy: WFL for tasks assessed/performed Overall Cognitive Status: Within Functional Limits for tasks assessed                      General Comments      Exercises General Exercises - Lower Extremity Ankle Circles/Pumps: AROM;Both;10 reps;Seated Long Arc Quad: AROM;Both;10 reps;Seated Hip ABduction/ADduction: AROM;Both;10 reps;Seated Hip Flexion/Marching: AROM;Both;10 reps;Seated Heel  Raises: AROM;Both;10 reps;Seated      Assessment/Plan    PT Assessment Patient needs continued PT services  PT Diagnosis Difficulty walking   PT Problem List Decreased strength;Decreased activity tolerance;Decreased mobility  PT Treatment Interventions DME instruction;Gait training;Functional mobility training;Therapeutic activities;Therapeutic exercise;Patient/family education   PT Goals  (Current goals can be found in the Care Plan section) Acute Rehab PT Goals Patient Stated Goal: to go home tomorrow PT Goal Formulation: With patient Time For Goal Achievement: 11/25/14 Potential to Achieve Goals: Good    Frequency Min 3X/week   Barriers to discharge        Co-evaluation               End of Session   Activity Tolerance: Patient tolerated treatment well Patient left: in bed;with call bell/phone within reach Nurse Communication: Mobility status         Time: 1535-1605 PT Time Calculation (min) (ACUTE ONLY): 30 min   Charges:   PT Evaluation $Initial PT Evaluation Tier I: 1 Procedure PT Treatments $Therapeutic Activity: 8-22 mins   PT G Codes:        Claretha Cooper 11/11/2014, 4:24 PM Tresa Endo PT [phone]

## 2014-11-11 NOTE — Progress Notes (Signed)
[facility] PROGRESS NOTE  Carrie Berry CVE:[id number] DOB: Sep 01, 1950 DOA: 11/10/2014 PCP: [name], MD  Assessment/Plan: Active Problems:   Generalized weakness - Most likely secondary to dehydration and viral gastroenteritis.    Viral gastroenteritis - Patient states her condition is improved -Continue supportive therapy  Metastatic breast cancer -Patient to follow-up with oncology after discharge for further evaluation recommendations. Radiation oncologist notified of patient's in-house stay according to EMR   Code Status: Full Family Communication: No family at bedside Disposition Plan: With continued improvement most likely DC next a.m.   Consultants:  none  Procedures:  none  Antibiotics:  none   HPI/Subjective: Patient reports feeling better  Objective: Filed Vitals:   11/11/14 0533  BP: 108/64  Pulse: 76  Temp: 99.1 F (37.3 C)  Resp: 18    Intake/Output Summary (Last 24 hours) at 11/11/14 1435 Last data filed at 11/11/14 0845  Gross per 24 hour  Intake   1865 ml  Output   2000 ml  Net   -135 ml   Filed Weights   11/10/14 1646  Weight: 64.2 kg (141 lb 8.6 oz)    Exam:   General:  Patient in no acute distress, alert and awake  Cardiovascular: Regular rate and rhythm, no murmurs or rubs  Respiratory: Clear to auscultation bilaterally, no wheezes  Abdomen: Soft, nondistended, nontender  Musculoskeletal: No cyanosis or clubbing   Data Reviewed: Basic Metabolic Panel:  Recent Labs Lab 11/10/14 1152 11/10/14 1158 11/11/14 0655  NA 140  --  139  K 3.6  --  4.0  CL 105  --  108  CO2 25  --  26  GLUCOSE 74  --  113*  BUN 13  --  6  CREATININE 0.69  --  0.47  CALCIUM 8.8*  --  8.3*  MG  --  1.9  --   PHOS  --  3.2  --    Liver Function Tests:  Recent Labs Lab 11/10/14 1152  AST 102*  ALT 126*  ALKPHOS 373*  BILITOT 0.7  PROT 6.1*  ALBUMIN 3.1*   No results for input(s): LIPASE, AMYLASE in the last 168  hours. No results for input(s): AMMONIA in the last 168 hours. CBC:  Recent Labs Lab 11/10/14 1152 11/11/14 0655  WBC 7.0 6.3  NEUTROABS 4.5  --   HGB 12.5 10.3*  HCT 37.8 30.8*  MCV 100.3* 100.3*  PLT 180 135*   Cardiac Enzymes: No results for input(s): CKTOTAL, CKMB, CKMBINDEX, TROPONINI in the last 168 hours. BNP (last 3 results) No results for input(s): BNP in the last 8760 hours.  ProBNP (last 3 results) No results for input(s): PROBNP in the last 8760 hours.  CBG: No results for input(s): GLUCAP in the last 168 hours.  Recent Results (from the past 240 hour(s))  Culture, blood (routine x 2)     Status: None (Preliminary result)   Collection Time: 11/10/14  5:40 PM  Result Value Ref Range Status   Specimen Description BLOOD LEFT ARM  Final   Special Requests BOTTLES DRAWN AEROBIC ONLY 3CC  Final   Culture   Final    NO GROWTH < 24 HOURS Performed at [facility]    Report Status PENDING  Incomplete  Culture, blood (routine x 2)     Status: None (Preliminary result)   Collection Time: 11/10/14  6:20 PM  Result Value Ref Range Status   Specimen Description BLOOD RIGHT HAND  Final   Special Requests  BOTTLES DRAWN AEROBIC AND ANAEROBIC 5CC  Final   Culture   Final    NO GROWTH < 24 HOURS Performed at [facility]    Report Status PENDING  Incomplete     Studies: Dg Chest 2 View  11/10/2014   CLINICAL DATA:  Nausea and weakness  EXAM: CHEST - 2 VIEW  COMPARISON:  08/27/2014  FINDINGS: Right-sided chest wall port is again noted. The cardiac shadow is stable. Persistent left basilar changes are seen laterally. No new focal infiltrate is seen. The known pulmonary nodules are not well visualized on this exam. This is predominately related to their size. No other focal abnormality is seen.  IMPRESSION: Stable left basilar changes.   Electronically Signed   By: Inez Catalina M.D.   On: 11/10/2014 11:17   Ct Head W Wo Contrast  11/10/2014   CLINICAL DATA:   Nausea, vomiting, and weakness beginning this morning. History of metastatic breast cancer.  EXAM: CT HEAD WITHOUT AND WITH CONTRAST  TECHNIQUE: Contiguous axial images were obtained from the base of the skull through the vertex without and with intravenous contrast  CONTRAST:  153mL OMNIPAQUE IOHEXOL 300 MG/ML  SOLN  COMPARISON:  Brain MRI 10/30/2014  FINDINGS: There is no evidence of acute large territory infarct, acute intracranial hemorrhage, midline shift, or extra-axial fluid collection. There is mild generalized cerebral atrophy. Extensive hypodensities in the cerebral white matter correspond to the T2 hyperintensities on recent MRI and are nonspecific but may reflect postradiation changes. 10 mm ring-enhancing lesion in the right cerebellum is unchanged, as is a 6 mm lesion in the right parietal lobe. Other, punctate enhancing lesions on the prior brain MRI are not well seen on this CT.  Orbits are unremarkable. Small bilateral mastoid effusions are noted. Visualized mastoid air cells are clear.  IMPRESSION: 1. No evidence of acute intracranial abnormality. 2. Unchanged, small right parietal and right cerebellar metastases. No new metastases or mass effect or identified.   Electronically Signed   By: Logan Bores   On: 11/10/2014 13:16   Ct Abdomen Pelvis W Contrast  11/10/2014   CLINICAL DATA:  Breast cancer diagnosed 2013 with metastases. Last chemotherapy earlier this month. Nausea, vomiting, weakness. Abdominal pain.  EXAM: CT ABDOMEN AND PELVIS WITH CONTRAST  TECHNIQUE: Multidetector CT imaging of the abdomen and pelvis was performed using the standard protocol following bolus administration of intravenous contrast.  CONTRAST:  156mL OMNIPAQUE IOHEXOL 300 MG/ML  SOLN  COMPARISON:  MRI 11/02/2014.  CT 08/27/2014.  FINDINGS: Enlarging bilateral lower lobe pulmonary nodules. 11 mm right lower lobe pulmonary nodule on image 8. Adjacent nodules in the left lower lobe on image 11 each measure 14 mm. Trace  right pleural effusion. Heart is normal size.  Small hypodensities scattered throughout the liver, best seen in the left hepatic lobe on image 28, 31, 34 and in the right hepatic lobe on image 32, 35. These are new or enlarging since prior study concerning for worsening metastases. Right hepatic biliary ductal dilatation is again noted, similar to prior study.  Pancreas, spleen, adrenals and kidneys are unremarkable.  Stomach, large and small bowel are unremarkable. Uterus, adnexae and urinary bladder unremarkable. Free fluid, free air or adenopathy. No acute bony abnormality.  IMPRESSION: Enlarging bilateral lower lobe pulmonary nodules compatible with worsening metastatic disease.  New or enlarging low-density lesions within the liver compatible with worsening metastatic disease.   Electronically Signed   By: Rolm Baptise M.D.   On: 11/10/2014 14:15  Scheduled Meds: . acyclovir  400 mg Oral BID  . dexamethasone  2 mg Oral Daily  . docusate sodium  100 mg Oral Daily  . enoxaparin  95 mg Subcutaneous Q24H  . fluconazole  100 mg Oral Daily  . gabapentin  100 mg Oral TID  . levETIRAcetam  1,000 mg Oral BID  . loratadine  10 mg Oral Daily  . multivitamin with minerals  1 tablet Oral Daily  . sodium chloride  10-40 mL Intracatheter Q12H  . sodium chloride  3 mL Intravenous Q12H   Continuous Infusions: . dextrose 5 % and 0.45 % NaCl with KCl 20 mEq/L 125 mL/hr at 11/11/14 1104    Time spent: > 25 minutes    [name]  [facility] Pager [phone]. If 7PM-7AM, please contact night-coverage at [email or web address], password [facility] 11/11/2014, 2:35 PM  LOS: 1 day

## 2014-11-12 ENCOUNTER — Other Ambulatory Visit: Payer: BC Managed Care – PPO

## 2014-11-12 ENCOUNTER — Ambulatory Visit: Payer: BC Managed Care – PPO | Admitting: Oncology

## 2014-11-12 ENCOUNTER — Ambulatory Visit: Payer: BC Managed Care – PPO | Admitting: Radiation Oncology

## 2014-11-12 LAB — CLOSTRIDIUM DIFFICILE BY PCR: CDIFFPCR: POSITIVE — AB

## 2014-11-12 MED ORDER — METRONIDAZOLE 500 MG PO TABS
500.0000 mg | ORAL_TABLET | Freq: Three times a day (TID) | ORAL | Status: DC
  Administered 2014-11-12 – 2014-11-14 (×7): 500 mg via ORAL
  Filled 2014-11-12 (×7): qty 1

## 2014-11-12 NOTE — Progress Notes (Signed)
[facility] PROGRESS NOTE  Carrie Berry UYQ:[id number] DOB: 1951/04/06 DOA: 11/10/2014 PCP: Carrie Hams, MD   Brief Narrative Patient is a 64 year old with history of breast cancer who presented with generalized weakness and complains of diarrhea. Was recently exposed to her daughter who had a viral gastroenteritis. Initially was suspected she had a viral etiology. But upon further workup C. difficile came back positive.  Assessment/Plan: Active Problems:   Generalized weakness - Most likely secondary to dehydration and viral gastroenteritis. - Improving with improved oral intake and initial IV fluids.   C. Difficile - Most likely cause of diarrhea. Currently on Flagyl and improving. - We'll advance diet  Metastatic breast cancer -Patient to follow-up with oncology after discharge for further evaluation recommendations. Radiation oncologist notified of patient's in-house stay according to EMR   Code Status: Full Family Communication: No family at bedside Disposition Plan: With continued improvement most likely DC next a.m.   Consultants:  none  Procedures:  none  Antibiotics:  Flagyl  HPI/Subjective: Patient reports feeling better, still complaining of many bouts of diarrhea however. But it is slowing down reportedly  Objective: Filed Vitals:   11/12/14 1319  BP: 109/63  Pulse: 84  Temp: 99 F (37.2 C)  Resp: 16    Intake/Output Summary (Last 24 hours) at 11/12/14 1824 Last data filed at 11/12/14 1800  Gross per 24 hour  Intake   4860 ml  Output   2300 ml  Net   2560 ml   Filed Weights   11/10/14 1646  Weight: 64.2 kg (141 lb 8.6 oz)    Exam:   General:  Patient in no acute distress, alert and awake  Cardiovascular: Regular rate and rhythm, no murmurs or rubs  Respiratory: Clear to auscultation bilaterally, no wheezes  Abdomen: Soft, nondistended, nontender  Musculoskeletal: No cyanosis or clubbing   Data Reviewed: Basic  Metabolic Panel:  Recent Labs Lab 11/10/14 1152 11/10/14 1158 11/11/14 0655  NA 140  --  139  K 3.6  --  4.0  CL 105  --  108  CO2 25  --  26  GLUCOSE 74  --  113*  BUN 13  --  6  CREATININE 0.69  --  0.47  CALCIUM 8.8*  --  8.3*  MG  --  1.9  --   PHOS  --  3.2  --    Liver Function Tests:  Recent Labs Lab 11/10/14 1152  AST 102*  ALT 126*  ALKPHOS 373*  BILITOT 0.7  PROT 6.1*  ALBUMIN 3.1*   No results for input(s): LIPASE, AMYLASE in the last 168 hours. No results for input(s): AMMONIA in the last 168 hours. CBC:  Recent Labs Lab 11/10/14 1152 11/11/14 0655  WBC 7.0 6.3  NEUTROABS 4.5  --   HGB 12.5 10.3*  HCT 37.8 30.8*  MCV 100.3* 100.3*  PLT 180 135*   Cardiac Enzymes: No results for input(s): CKTOTAL, CKMB, CKMBINDEX, TROPONINI in the last 168 hours. BNP (last 3 results) No results for input(s): BNP in the last 8760 hours.  ProBNP (last 3 results) No results for input(s): PROBNP in the last 8760 hours.  CBG: No results for input(s): GLUCAP in the last 168 hours.  Recent Results (from the past 240 hour(s))  Culture, blood (routine x 2)     Status: None (Preliminary result)   Collection Time: 11/10/14  5:40 PM  Result Value Ref Range Status   Specimen Description BLOOD LEFT ARM  Final  Special Requests BOTTLES DRAWN AEROBIC ONLY 3CC  Final   Culture   Final    NO GROWTH 2 DAYS Performed at [facility]    Report Status PENDING  Incomplete  Culture, blood (routine x 2)     Status: None (Preliminary result)   Collection Time: 11/10/14  6:20 PM  Result Value Ref Range Status   Specimen Description BLOOD RIGHT HAND  Final   Special Requests BOTTLES DRAWN AEROBIC AND ANAEROBIC 5CC  Final   Culture   Final    NO GROWTH 2 DAYS Performed at [facility]    Report Status PENDING  Incomplete  Clostridium Difficile by PCR (not at [facility])     Status: Abnormal   Collection Time: 11/12/14  2:00 AM  Result Value Ref Range Status   C  difficile by pcr POSITIVE (A) NEGATIVE Final    Comment: CRITICAL RESULT CALLED TO, READ BACK BY AND VERIFIED WITH: DODSON,J RN [phone] COVINGTON,N      Studies: No results found.  Scheduled Meds: . acyclovir  400 mg Oral BID  . dexamethasone  2 mg Oral Daily  . docusate sodium  100 mg Oral Daily  . enoxaparin  95 mg Subcutaneous Q24H  . fluconazole  100 mg Oral Daily  . gabapentin  100 mg Oral TID  . levETIRAcetam  1,000 mg Oral BID  . loratadine  10 mg Oral Daily  . metroNIDAZOLE  500 mg Oral 3 times per day  . multivitamin with minerals  1 tablet Oral Daily  . sodium chloride  10-40 mL Intracatheter Q12H  . sodium chloride  3 mL Intravenous Q12H   Continuous Infusions: . dextrose 5 % and 0.45 % NaCl with KCl 20 mEq/L 125 mL/hr at 11/12/14 1800    Time spent: > 25 minutes    Carrie Berry  [facility] Pager [phone]. If 7PM-7AM, please contact night-coverage at [email or web address], password [facility] 11/12/2014, 6:24 PM  LOS: 2 days

## 2014-11-12 NOTE — Progress Notes (Signed)
Patient with several loose watery stools during the night. Sent stool for c diff and it resulted Positive. Paged NP Schorr with orders received to start flagyl.

## 2014-11-13 ENCOUNTER — Encounter (HOSPITAL_COMMUNITY): Payer: Self-pay | Admitting: Internal Medicine

## 2014-11-13 DIAGNOSIS — A0472 Enterocolitis due to Clostridium difficile, not specified as recurrent: Secondary | ICD-10-CM | POA: Diagnosis present

## 2014-11-13 DIAGNOSIS — E43 Unspecified severe protein-calorie malnutrition: Secondary | ICD-10-CM

## 2014-11-13 DIAGNOSIS — R748 Abnormal levels of other serum enzymes: Secondary | ICD-10-CM

## 2014-11-13 DIAGNOSIS — A047 Enterocolitis due to Clostridium difficile: Principal | ICD-10-CM

## 2014-11-13 DIAGNOSIS — R531 Weakness: Secondary | ICD-10-CM

## 2014-11-13 DIAGNOSIS — R5381 Other malaise: Secondary | ICD-10-CM

## 2014-11-13 DIAGNOSIS — C50911 Malignant neoplasm of unspecified site of right female breast: Secondary | ICD-10-CM

## 2014-11-13 DIAGNOSIS — C799 Secondary malignant neoplasm of unspecified site: Secondary | ICD-10-CM

## 2014-11-13 LAB — GI PATHOGEN PANEL BY PCR, STOOL
CAMPYLOBACTER BY PCR: NOT DETECTED
CRYPTOSPORIDIUM BY PCR: NOT DETECTED
E COLI (ETEC) LT/ST: NOT DETECTED
E COLI (STEC): NOT DETECTED
E COLI 0157 BY PCR: NOT DETECTED
G LAMBLIA BY PCR: NOT DETECTED
Norovirus GI/GII: NOT DETECTED
Rotavirus A by PCR: NOT DETECTED
SHIGELLA BY PCR: NOT DETECTED
Salmonella by PCR: NOT DETECTED

## 2014-11-13 MED ORDER — HYDROMORPHONE HCL 1 MG/ML IJ SOLN
1.0000 mg | Freq: Once | INTRAMUSCULAR | Status: AC
  Administered 2014-11-13: 1 mg via INTRAVENOUS
  Filled 2014-11-13: qty 1

## 2014-11-13 MED ORDER — HYDROMORPHONE HCL 2 MG PO TABS
2.0000 mg | ORAL_TABLET | ORAL | Status: DC | PRN
Start: 2014-11-13 — End: 2014-11-14
  Administered 2014-11-13 – 2014-11-14 (×3): 2 mg via ORAL
  Filled 2014-11-13 (×3): qty 1

## 2014-11-13 MED ORDER — HYDROMORPHONE HCL 2 MG PO TABS
1.0000 mg | ORAL_TABLET | Freq: Once | ORAL | Status: AC
  Administered 2014-11-13: 1 mg via ORAL
  Filled 2014-11-13: qty 1

## 2014-11-13 MED ORDER — HYDROMORPHONE HCL 2 MG PO TABS
1.0000 mg | ORAL_TABLET | ORAL | Status: DC | PRN
  Administered 2014-11-13: 1 mg via ORAL
  Filled 2014-11-13: qty 1

## 2014-11-13 NOTE — Progress Notes (Signed)
Physical Therapy Treatment Patient Details Name: Carrie Berry MRN: [id number] DOB: November 19, 1950 Today's Date: 11/13/2014    History of Present Illness Presenting with several days complaint of weakness. Reportedly has been getting worse. Marland Kitchen She was around sick contact her daughter that had diarrhea and emesis. It was suspected that she had a viral gastroenteritis. PaTIENT HAS A HISTORY OF LUNG CANCER , BRAIN METS.    PT Comments    Assisted pt OOB to BR to void then amb a limited distance in hallway with walker.  Assisted back to bed.   Follow Up Recommendations  Home health PT     Equipment Recommendations  Other (comment) (has all equipment)    Recommendations for Other Services       Precautions / Restrictions Precautions Precautions: Fall Precaution Comments: dizzy Restrictions Weight Bearing Restrictions: No    Mobility  Bed Mobility Overal bed mobility: Needs Assistance Bed Mobility: Supine to Sit;Sit to Supine     Supine to sit: Supervision;Min guard Sit to supine: Supervision;Min guard   General bed mobility comments: assist B LE up onto bed  Transfers Overall transfer level: Needs assistance Equipment used: None Transfers: Sit to/from Stand Sit to Stand: Min assist Stand pivot transfers: Min assist       General transfer comment: 25% VC's on proper tech and hand placement  Ambulation/Gait Ambulation/Gait assistance: Min assist Ambulation Distance (Feet): 32 Feet Assistive device: Rolling walker (2 wheeled) Gait Pattern/deviations: Step-to pattern;Step-through pattern;Staggering left;Staggering right Gait velocity: decreased   General Gait Details: amb from bed to BR 5 feet.  Amb from BR to chair 7 feet.  Amb from chair in hallway back around to bed another 12 feet.   X 3 sitting rest breaks needed.  Mild c/o dizziness but nothing near what it was before, stated pt.   Stairs            Wheelchair Mobility    Modified Rankin (Stroke Patients  Only)       Balance                                    Cognition                            Exercises      General Comments        Pertinent Vitals/Pain Pain Assessment: No/denies pain    Home Living                      Prior Function            PT Goals (current goals can now be found in the care plan section) Progress towards PT goals: Progressing toward goals    Frequency  Min 3X/week    PT Plan      Co-evaluation             End of Session Equipment Utilized During Treatment: Gait belt Activity Tolerance: Patient tolerated treatment well;Patient limited by fatigue Patient left: in bed;with call bell/phone within reach;with family/visitor present     Time: 1540-1600 PT Time Calculation (min) (ACUTE ONLY): 20 min  Charges:  $Gait Training: 8-22 mins                    G Codes:      Rica Koyanagi  PTA [facility]  Acute  Rehab  Pager      [phone]

## 2014-11-13 NOTE — Progress Notes (Signed)
Progress Note   Carrie Berry VHQ:[id number] DOB: [birth year] DOA: 11/10/2014 PCP: [name], MD   Brief Narrative:   Carrie Berry is an 64 y.o. female with a PMH of metastatic breast cancer who was admitted on 11/10/14 with chief complaint of weakness and vomiting, initially suspected to be a viral gastroenteritis. Further workup revealed that she was positive for C. difficile.  Assessment/Plan:   Principal Problem:   C. difficile colitis - Continue Flagyl.  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 - Oncologist aware of admission.    Physical deconditioning / generalized weakness - Continue physical therapy. Will need home health PT and 24 hour supervision at discharge.    Protein-calorie malnutrition, severe - Dietitian consultation requested.    Elevated liver enzymes - Likely from liver metastasis, which were observed on CT scan done 11/10/14.    DVT Prophylaxis - Continue Lovenox.  Family Communication: Husband at bedside. Disposition Plan: Home when stable, possibly 11/14/14 if symptomatically improved. Code Status:     Code Status Orders        Start     Ordered   11/10/14 1653  Full code   Continuous     11/10/14 1652        IV Access:    Peripheral IV   Procedures and diagnostic studies:   Dg Chest 2 View  11/10/2014   CLINICAL DATA:  Nausea and weakness  EXAM: CHEST - 2 VIEW  COMPARISON:  08/27/2014  FINDINGS: Right-sided chest wall port is again noted. The cardiac shadow is stable. Persistent left basilar changes are seen laterally. No new focal infiltrate is seen. The known pulmonary nodules are not well visualized on this exam. This is predominately related to their size. No other focal abnormality is seen.  IMPRESSION: Stable left basilar changes.   Electronically Signed   By: Inez Catalina M.D.   On: 11/10/2014 11:17   Ct Head W Wo  Contrast  11/10/2014   CLINICAL DATA:  Nausea, vomiting, and weakness beginning this morning. History of metastatic breast cancer.  EXAM: CT HEAD WITHOUT AND WITH CONTRAST  TECHNIQUE: Contiguous axial images were obtained from the base of the skull through the vertex without and with intravenous contrast  CONTRAST:  153mL OMNIPAQUE IOHEXOL 300 MG/ML  SOLN  COMPARISON:  Brain MRI 10/30/2014  FINDINGS: There is no evidence of acute large territory infarct, acute intracranial hemorrhage, midline shift, or extra-axial fluid collection. There is mild generalized cerebral atrophy. Extensive hypodensities in the cerebral white matter correspond to the T2 hyperintensities on recent MRI and are nonspecific but may reflect postradiation changes. 10 mm ring-enhancing lesion in the right cerebellum is unchanged, as is a 6 mm lesion in the right parietal lobe. Other, punctate enhancing lesions on the prior brain MRI are not well seen on this CT.  Orbits are unremarkable. Small bilateral mastoid effusions are noted. Visualized mastoid air cells are clear.  IMPRESSION: 1. No evidence of acute intracranial abnormality. 2. Unchanged, small right parietal and right cerebellar metastases. No new metastases or mass effect or identified.   Electronically Signed   By: Logan Bores   On: 11/10/2014 13:16   Ct Abdomen Pelvis W Contrast  11/10/2014   CLINICAL DATA:  Breast cancer diagnosed 2013 with metastases. Last chemotherapy earlier this month. Nausea, vomiting, weakness. Abdominal pain.  EXAM: CT ABDOMEN AND PELVIS WITH CONTRAST  TECHNIQUE:  Multidetector CT imaging of the abdomen and pelvis was performed using the standard protocol following bolus administration of intravenous contrast.  CONTRAST:  19mL OMNIPAQUE IOHEXOL 300 MG/ML  SOLN  COMPARISON:  MRI 11/02/2014.  CT 08/27/2014.  FINDINGS: Enlarging bilateral lower lobe pulmonary nodules. 11 mm right lower lobe pulmonary nodule on image 8. Adjacent nodules in the left lower  lobe on image 11 each measure 14 mm. Trace right pleural effusion. Heart is normal size.  Small hypodensities scattered throughout the liver, best seen in the left hepatic lobe on image 28, 31, 34 and in the right hepatic lobe on image 32, 35. These are new or enlarging since prior study concerning for worsening metastases. Right hepatic biliary ductal dilatation is again noted, similar to prior study.  Pancreas, spleen, adrenals and kidneys are unremarkable.  Stomach, large and small bowel are unremarkable. Uterus, adnexae and urinary bladder unremarkable. Free fluid, free air or adenopathy. No acute bony abnormality.  IMPRESSION: Enlarging bilateral lower lobe pulmonary nodules compatible with worsening metastatic disease.  New or enlarging low-density lesions within the liver compatible with worsening metastatic disease.   Electronically Signed   By: Rolm Baptise M.D.   On: 11/10/2014 14:15     Medical Consultants:    None.  Anti-Infectives:    Anti-infectives    Start     Dose/Rate Route Frequency Ordered Stop   11/12/14 0600  metroNIDAZOLE (FLAGYL) tablet 500 mg     500 mg Oral 3 times per day 11/12/14 0414     11/10/14 2200  acyclovir (ZOVIRAX) tablet 400 mg     400 mg Oral 2 times daily 11/10/14 1652     11/10/14 1800  fluconazole (DIFLUCAN) tablet 100 mg     100 mg Oral Daily 11/10/14 1652        Subjective:   Carrie Berry does not feel well today. She reports ongoing problems with epigastric abdominal pain, and poor appetite. Her bowels have moved once today, but 9 times yesterday.  Objective:    Filed Vitals:   11/12/14 1319 11/12/14 2252 11/13/14 0547 11/13/14 1355  BP: 109/63 116/65 101/68 109/64  Pulse: 84 72 86 88  Temp: 99 F (37.2 C) 98.5 F (36.9 C) 98.6 F (37 C) 98.9 F (37.2 C)  TempSrc: Oral Oral Oral Oral  Resp: 16 18 18 18   Height:      Weight:      SpO2: 91% 97% 100% 98%    Intake/Output Summary (Last 24 hours) at 11/13/14 1807 Last data filed  at 11/13/14 1700  Gross per 24 hour  Intake   3405 ml  Output   2100 ml  Net   1305 ml    Exam: Gen:  NAD Cardiovascular:  RRR, No M/R/G Respiratory:  Lungs CTAB Gastrointestinal:  Abdomen soft, NT/ND, + BS Extremities:  No C/E/C   Data Reviewed:    Labs: Basic Metabolic Panel:  Recent Labs Lab 11/10/14 1152 11/10/14 1158 11/11/14 0655  NA 140  --  139  K 3.6  --  4.0  CL 105  --  108  CO2 25  --  26  GLUCOSE 74  --  113*  BUN 13  --  6  CREATININE 0.69  --  0.47  CALCIUM 8.8*  --  8.3*  MG  --  1.9  --   PHOS  --  3.2  --    GFR Estimated Creatinine Clearance: 66.6 mL/min (by C-G formula based on Cr of 0.47).  Liver Function Tests:  Recent Labs Lab 11/10/14 1152  AST 102*  ALT 126*  ALKPHOS 373*  BILITOT 0.7  PROT 6.1*  ALBUMIN 3.1*   CBC:  Recent Labs Lab 11/10/14 1152 11/11/14 0655  WBC 7.0 6.3  NEUTROABS 4.5  --   HGB 12.5 10.3*  HCT 37.8 30.8*  MCV 100.3* 100.3*  PLT 180 135*   Microbiology Recent Results (from the past 240 hour(s))  Culture, blood (routine x 2)     Status: None (Preliminary result)   Collection Time: 11/10/14  5:40 PM  Result Value Ref Range Status   Specimen Description BLOOD LEFT ARM  Final   Special Requests BOTTLES DRAWN AEROBIC ONLY 3CC  Final   Culture   Final    NO GROWTH 3 DAYS Performed at [facility]    Report Status PENDING  Incomplete  Culture, blood (routine x 2)     Status: None (Preliminary result)   Collection Time: 11/10/14  6:20 PM  Result Value Ref Range Status   Specimen Description BLOOD RIGHT HAND  Final   Special Requests BOTTLES DRAWN AEROBIC AND ANAEROBIC 5CC  Final   Culture   Final    NO GROWTH 3 DAYS Performed at [facility]    Report Status PENDING  Incomplete  Clostridium Difficile by PCR (not at [facility])     Status: Abnormal   Collection Time: 11/12/14  2:00 AM  Result Value Ref Range Status   C difficile by pcr POSITIVE (A) NEGATIVE Final    Comment: CRITICAL  RESULT CALLED TO, READ BACK BY AND VERIFIED WITH: DODSON,J RN [id number] COVINGTON,N      Medications:   . acyclovir  400 mg Oral BID  . dexamethasone  2 mg Oral Daily  . docusate sodium  100 mg Oral Daily  . enoxaparin  95 mg Subcutaneous Q24H  . fluconazole  100 mg Oral Daily  . gabapentin  100 mg Oral TID  . levETIRAcetam  1,000 mg Oral BID  . loratadine  10 mg Oral Daily  . metroNIDAZOLE  500 mg Oral 3 times per day  . multivitamin with minerals  1 tablet Oral Daily  . sodium chloride  10-40 mL Intracatheter Q12H  . sodium chloride  3 mL Intravenous Q12H   Continuous Infusions: . dextrose 5 % and 0.45 % NaCl with KCl 20 mEq/L 125 mL/hr at 11/13/14 1700    Time spent: 25 minutes.   LOS: 3 days   [facility] Pager [phone]. If unable to reach me by pager, please call my cell phone at [phone].  *Please refer to [email or web address], password [email or web address] to get updated schedule on who will round on this patient, as hospitalists switch teams weekly. If 7PM-7AM, please contact night-coverage at [email or web address], password [email or web address] for any overnight needs.  11/13/2014, 6:07 PM

## 2014-11-13 NOTE — Progress Notes (Signed)
Pt selected [facility] for HHPT.  Referral given to in house rep. 

## 2014-11-14 LAB — CBC
HCT: 34.2 % — ABNORMAL LOW (ref 36.0–46.0)
Hemoglobin: 11.1 g/dL — ABNORMAL LOW (ref 12.0–15.0)
MCH: 31.9 pg (ref 26.0–34.0)
MCHC: 32.5 g/dL (ref 30.0–36.0)
MCV: 98.3 fL (ref 78.0–100.0)
Platelets: 184 10*3/uL (ref 150–400)
RBC: 3.48 MIL/uL — ABNORMAL LOW (ref 3.87–5.11)
RDW: 18.5 % — ABNORMAL HIGH (ref 11.5–15.5)
WBC: 7.6 10*3/uL (ref 4.0–10.5)

## 2014-11-14 LAB — BASIC METABOLIC PANEL
Anion gap: 7 (ref 5–15)
BUN: <5 mg/dL — ABNORMAL LOW (ref 6–20)
CO2: 26 mmol/L (ref 22–32)
Calcium: 8.6 mg/dL — ABNORMAL LOW (ref 8.9–10.3)
Chloride: 104 mmol/L (ref 101–111)
Creatinine, Ser: 0.53 mg/dL (ref 0.44–1.00)
GFR calc non Af Amer: >60 mL/min (ref >60)
GLUCOSE: 90 mg/dL (ref 65–99)
Potassium: 4.3 mmol/L (ref 3.5–5.1)
SODIUM: 137 mmol/L (ref 135–145)

## 2014-11-14 MED ORDER — METRONIDAZOLE 500 MG PO TABS: 500.0000 mg | ORAL_TABLET | Freq: Three times a day (TID) | ORAL | Status: DC

## 2014-11-14 MED ORDER — TRAZODONE HCL 50 MG PO TABS: ORAL_TABLET | ORAL | Status: DC

## 2014-11-14 MED ORDER — HEPARIN SOD (PORK) LOCK FLUSH 100 UNIT/ML IV SOLN
500.0000 [IU] | INTRAVENOUS | Status: AC | PRN
  Administered 2014-11-14: 500 [IU]

## 2014-11-14 MED ORDER — LIDOCAINE-PRILOCAINE 2.5-2.5 % EX CREA: 1.0000 "application " | TOPICAL_CREAM | Freq: Every day | CUTANEOUS | Status: DC | PRN

## 2014-11-14 NOTE — Progress Notes (Signed)
Pt d/c home with husband. Reviewed d/c instructions not questions at this time. Pt given phone number to [place] as she wants to contact them as she is leaving for the beach not going straight home.  Pt rolled to front entrance, met by family.  Nolon Nations, RN

## 2014-11-14 NOTE — Discharge Summary (Signed)
Physician Discharge Summary  Carrie Berry ZOX:[id number] DOB: 09-Jul-1950 DOA: 11/10/2014  PCP: [name], MD  Admit date: 11/10/2014 Discharge date: 11/14/2014   Recommendations for Outpatient Follow-Up:   1. Home health physical therapy set up at discharge. 2. PCP, please follow-up on final blood cultures which are not finalized at the time of this dictation.   Discharge Diagnosis:   Principal Problem:    C. 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    Discharge Condition: Improved.  Diet recommendation: Regular.  History of Present Illness:   Carrie Berry is an 64 y.o. female with a PMH of metastatic breast cancer who was admitted on 11/10/14 with chief complaint of weakness and vomiting, initially suspected to be a viral gastroenteritis. Further workup revealed that she was positive for C. difficile.  Hospital Course by Problem:   Principal Problem:  C. difficile colitis - Continue Flagyl for a total course of 2 weeks.  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 - Oncologist aware of admission.   Physical deconditioning / generalized weakness - Continue physical therapy. Home health PT arranged prior to discharge.   Protein-calorie malnutrition, severe - Appetite improved at discharge, will resume Remeron.   Elevated liver enzymes - Likely from liver metastasis, which were observed on CT scan done 11/10/14.    Medical Consultants:    None.   Discharge Exam:   Filed Vitals:   11/14/14 0522  BP: 115/75  Pulse:   Temp: 98.2 F (36.8 C)  Resp: 18   Filed Vitals:   11/13/14 0547 11/13/14 1355 11/13/14 2058 11/14/14 0522  BP:  101/68 109/64 103/57 115/75  Pulse: 86 88 75   Temp: 98.6 F (37 C) 98.9 F (37.2 C) 98.7 F (37.1 C) 98.2 F (36.8 C)  TempSrc: Oral Oral Oral Oral  Resp: 18 18 18 18   Height:      Weight:      SpO2: 100% 98% 98% 97%    Gen:  NAD Cardiovascular:  RRR, No M/R/G Respiratory: Lungs CTAB Gastrointestinal: Abdomen soft, NT/ND with normal active bowel sounds. Extremities: No C/E/C   The results of significant diagnostics from this hospitalization (including imaging, microbiology, ancillary and laboratory) are listed below for reference.     Procedures and Diagnostic Studies:   Dg Chest 2 View  11/10/2014   CLINICAL DATA:  Nausea and weakness  EXAM: CHEST - 2 VIEW  COMPARISON:  08/27/2014  FINDINGS: Right-sided chest wall port is again noted. The cardiac shadow is stable. Persistent left basilar changes are seen laterally. No new focal infiltrate is seen. The known pulmonary nodules are not well visualized on this exam. This is predominately related to their size. No other focal abnormality is seen.  IMPRESSION: Stable left basilar changes.   Electronically Signed   By: Inez Catalina M.D.   On: 11/10/2014 11:17   Ct Head W Wo Contrast  11/10/2014   CLINICAL DATA:  Nausea, vomiting, and weakness beginning this morning. History of metastatic breast cancer.  EXAM: CT HEAD WITHOUT AND WITH CONTRAST  TECHNIQUE: Contiguous axial images were obtained from the base of the skull through the vertex without and with intravenous contrast  CONTRAST:  134mL OMNIPAQUE IOHEXOL 300 MG/ML  SOLN  COMPARISON:  Brain MRI 10/30/2014  FINDINGS: There is no evidence of acute large territory infarct, acute intracranial hemorrhage, midline shift, or extra-axial fluid collection. There is mild generalized cerebral atrophy. Extensive hypodensities in the cerebral white matter correspond to the T2 hyperintensities on recent MRI and are nonspecific but may reflect postradiation changes. 10 mm ring-enhancing lesion in the  right cerebellum is unchanged, as is a 6 mm lesion in the right parietal lobe. Other, punctate enhancing lesions on the prior brain MRI are not well seen on this CT.  Orbits are unremarkable. Small bilateral mastoid effusions are noted. Visualized mastoid air cells are clear.  IMPRESSION: 1. No evidence of acute intracranial abnormality. 2. Unchanged, small right parietal and right cerebellar metastases. No new metastases or mass effect or identified.   Electronically Signed   By: Logan Bores   On: 11/10/2014 13:16   Mr Brain W Wo Contrast  10/30/2014   CLINICAL DATA:  Right breast cancer with brain metastases. Whole-brain radiation completed 03/2014. Ongoing chemotherapy.  EXAM: MRI HEAD WITHOUT AND WITH CONTRAST  TECHNIQUE: Multiplanar, multiecho pulse sequences of the brain and surrounding structures were obtained without and with intravenous contrast.  CONTRAST:  58mL MULTIHANCE GADOBENATE DIMEGLUMINE 529 MG/ML IV SOLN  COMPARISON:  07/20/2014  FINDINGS: There is no evidence of acute infarct, or extra-axial fluid collection. Thigh there is mild global cerebral atrophy. Patchy and confluent T2 hyperintensities throughout the cerebral white matter bilaterally have mildly progressed from the prior study and most likely reflect postradiation changes. Trace leftward midline shift is unchanged.  10 mm enhancing right cerebellar lesion (series 10, image 34), 2 mm left occipital lesion (series 10, image 56), 2 mm right occipital lesion near the occipital horn (series 10, image 71), 9 mm right temporal occipital lesion (series 10, image 70), 3 mm enhancing lesion posterior to the atrium of the right lateral ventricle (series 10, image 78), 6 mm right parietal lesion (series 10, image 89), 1 mm posterior right parietal lesion (series 10, image 90), 2 mm right frontoparietal lesion (series 10, image 95), 2 mm posterior right frontal lesion (series 10, image 113), 2 mm anterior right frontal lesion (series 10, image  115), and 2 mm posterior left frontal lesion (series 10, image 130) are all unchanged.  There is a 2 mm punctate focus of enhancement in the right thalamus which was not clearly present on the prior study (series 10, image 73 and series 11, image 22). There is a small amount of chronic blood products associated with a few of the lesions, most notably the 6 mm right parietal lesion.  Orbits are unremarkable. Minimal bilateral maxillary sinus mucosal thickening and moderate bilateral mastoid effusions are noted. Major intracranial vascular flow voids are preserved. Prominent fatty marrow throughout the skull is consistent with prior radiation therapy.  IMPRESSION: 1. New punctate 2 mm lesion in the right thalamus. 2. Unchanged appearance of other metastases. 3. Progressive white matter changes, most likely related to prior radiation therapy.   Electronically Signed   By: Logan Bores   On: 10/30/2014 15:35   Ct Abdomen Pelvis W Contrast  11/10/2014   CLINICAL DATA:  Breast cancer diagnosed 2013 with metastases. Last chemotherapy earlier this month. Nausea, vomiting, weakness. Abdominal pain.  EXAM: CT ABDOMEN AND PELVIS WITH CONTRAST  TECHNIQUE: Multidetector CT imaging of the abdomen and pelvis was performed using the standard protocol following bolus administration of intravenous contrast.  CONTRAST:  142mL OMNIPAQUE  IOHEXOL 300 MG/ML  SOLN  COMPARISON:  MRI 11/02/2014.  CT 08/27/2014.  FINDINGS: Enlarging bilateral lower lobe pulmonary nodules. 11 mm right lower lobe pulmonary nodule on image 8. Adjacent nodules in the left lower lobe on image 11 each measure 14 mm. Trace right pleural effusion. Heart is normal size.  Small hypodensities scattered throughout the liver, best seen in the left hepatic lobe on image 28, 31, 34 and in the right hepatic lobe on image 32, 35. These are new or enlarging since prior study concerning for worsening metastases. Right hepatic biliary ductal dilatation is again noted, similar  to prior study.  Pancreas, spleen, adrenals and kidneys are unremarkable.  Stomach, large and small bowel are unremarkable. Uterus, adnexae and urinary bladder unremarkable. Free fluid, free air or adenopathy. No acute bony abnormality.  IMPRESSION: Enlarging bilateral lower lobe pulmonary nodules compatible with worsening metastatic disease.  New or enlarging low-density lesions within the liver compatible with worsening metastatic disease.   Electronically Signed   By: Rolm Baptise M.D.   On: 11/10/2014 14:15   Mr Liver W Wo Contrast  11/02/2014   CLINICAL DATA:  Metastatic breast cancer with liver metastasis.  EXAM: MRI ABDOMEN WITHOUT AND WITH CONTRAST  TECHNIQUE: Multiplanar multisequence MR imaging of the abdomen was performed both before and after the administration of intravenous contrast.  CONTRAST:  73mL MULTIHANCE GADOBENATE DIMEGLUMINE 529 MG/ML IV SOLN  COMPARISON:  MRI abdomen 08/14/2014, PET-CT 01/07/2013. CT abdomen 08/27/2014  FINDINGS: Lower chest: There is a 1.5 cm nodule in the left lower lobe seen on coronal T2 weighted series 6 (image 7).  Hepatobiliary: In comparing the post-contrast T1 weighted series on today's MRI to MRI 08/14/2014 there are no new enhancing lesions. The subcapsular lesion in the anterior central left hepatic lobe (image segment 4A) measures 14 mm on image 34 of series 1101 compared to 17 mm. A cluster of enhancing lesion in the subcapsular right hepatic lobe measures 22 x 17 mm on image 34 of same series compared to 23 x 21 mm on prior. Lesion inferior right hepatic lobe on image 49 measures 6 mm compared to 6 mm.  There is again demonstrated intrahepatic biliary duct dilatation in the right hepatic lobe. There is a likely central obstructing lesion but no discrete measurable lesions identified.  The degree of biliary duct dilatation appears appears slightly increased. For example increase duct dilatation inferior right hepatic lobe on image 53 appears increased.  Central dilatation on image 37 in the right hepatic lobe appears increased.  Portal veins are patent.  Pancreas : No pancreatic lesion.  No pancreatic duct dilatation.  Spleen: Normal spleen.  Adrenals/urinary tract: Adrenal glands and kidneys are normal.  Stomach/Bowel: Stomach and limited of the small bowel is unremarkable  Vascular/Lymphatic: Abdominal aortic normal caliber. No retroperitoneal periportal lymphadenopathy.  Musculoskeletal: No aggressive osseous lesion  IMPRESSION: 1. No new hepatic lesions. 2. Enhancing hepatic metastasis in the right hepatic lobe appear are similar to comparison exam to mildly decreased. 3. The degree of biliary ductal dilatation in the right hepatic lobe appears slightly increased. Central obstructing lesion is not identified. 4. Consider FDG PET scan to evaluate for residual metabolic active disease. Central obstructing biliary lesion may also be identified. 5. Again demonstrated a right lower lobe nodule seen on the coronal imaging. This could also be evaluated by FDG PET scan.   Electronically Signed   By: Suzy Bouchard M.D.   On: 11/02/2014 17:31     Labs:  Basic Metabolic Panel:  Recent Labs Lab 11/10/14 1152 11/10/14 1158 11/11/14 0655 11/14/14 0550  NA 140  --  139 137  K 3.6  --  4.0 4.3  CL 105  --  108 104  CO2 25  --  26 26  GLUCOSE 74  --  113* 90  BUN 13  --  6 <5*  CREATININE 0.69  --  0.47 0.53  CALCIUM 8.8*  --  8.3* 8.6*  MG  --  1.9  --   --   PHOS  --  3.2  --   --    GFR Estimated Creatinine Clearance: 66.6 mL/min (by C-G formula based on Cr of 0.53). Liver Function Tests:  Recent Labs Lab 11/10/14 1152  AST 102*  ALT 126*  ALKPHOS 373*  BILITOT 0.7  PROT 6.1*  ALBUMIN 3.1*   CBC:  Recent Labs Lab 11/10/14 1152 11/11/14 0655 11/14/14 0550  WBC 7.0 6.3 7.6  NEUTROABS 4.5  --   --   HGB 12.5 10.3* 11.1*  HCT 37.8 30.8* 34.2*  MCV 100.3* 100.3* 98.3  PLT 180 135* 184   Cardiac Enzymes: No results for  input(s): CKTOTAL, CKMB, CKMBINDEX, TROPONINI in the last 168 hours. Microbiology Recent Results (from the past 240 hour(s))  Culture, blood (routine x 2)     Status: None (Preliminary result)   Collection Time: 11/10/14  5:40 PM  Result Value Ref Range Status   Specimen Description BLOOD LEFT ARM  Final   Special Requests BOTTLES DRAWN AEROBIC ONLY 3CC  Final   Culture   Final    NO GROWTH 4 DAYS Performed at [facility]    Report Status PENDING  Incomplete  Culture, blood (routine x 2)     Status: None (Preliminary result)   Collection Time: 11/10/14  6:20 PM  Result Value Ref Range Status   Specimen Description BLOOD RIGHT HAND  Final   Special Requests BOTTLES DRAWN AEROBIC AND ANAEROBIC 5CC  Final   Culture   Final    NO GROWTH 4 DAYS Performed at [facility]    Report Status PENDING  Incomplete  Clostridium Difficile by PCR (not at [facility])     Status: Abnormal   Collection Time: 11/12/14  2:00 AM  Result Value Ref Range Status   C difficile by pcr POSITIVE (A) NEGATIVE Final    Comment: CRITICAL RESULT CALLED TO, READ BACK BY AND VERIFIED WITH: DODSON,J RN [id number] COVINGTON,N      Discharge Instructions:   Discharge Instructions    Call MD for:  extreme fatigue    Complete by:  As directed      Call MD for:  persistant nausea and vomiting    Complete by:  As directed      Call MD for:  severe uncontrolled pain    Complete by:  As directed      Call MD for:  temperature >100.4    Complete by:  As directed      Diet general    Complete by:  As directed      Discharge instructions    Complete by:  As directed   You were cared for by Dr. Jacquelynn Cree  (a hospitalist) during your hospital stay. If you have any questions about your discharge medications or the care you received while you were in the hospital after you are discharged, you can call the unit and ask to speak with the hospitalist on call if the  hospitalist that took care of you is not  available. Once you are discharged, your primary care physician will handle any further medical issues. Please note that NO REFILLS for any discharge medications will be authorized once you are discharged, as it is imperative that you return to your primary care physician (or establish a relationship with a primary care physician if you do not have one) for your aftercare needs so that they can reassess your need for medications and monitor your lab values.  Any outstanding tests can be reviewed by your PCP at your follow up visit.  It is also important to review any medicine changes with your PCP.  Please bring these d/c instructions with you to your next visit so your physician can review these changes with you.  If you do not have a primary care physician, you can call [phone] for a physician referral.  It is highly recommended that you obtain a PCP for hospital follow up.     Face-to-face encounter (required for Medicare/Medicaid patients)    Complete by:  As directed   I RAMA,CHRISTINA certify that this patient is under my care and that I, or a nurse practitioner or physician's assistant working with me, had a face-to-face encounter that meets the physician face-to-face encounter requirements with this patient on 11/14/2014. The encounter with the patient was in whole, or in part for the following medical condition(s) which is the primary reason for home health care (List medical condition): Deconditioning.  The encounter with the patient was in whole, or in part, for the following medical condition, which is the primary reason for home health care:  C diff  I certify that, based on my findings, the following services are medically necessary home health services:  Physical therapy  Reason for Medically Necessary Home Health Services:  Therapy- Therapeutic Exercises to Increase Strength and Endurance  My clinical findings support the need for the above services:  Unable to leave home safely without  assistance and/or assistive device  Further, I certify that my clinical findings support that this patient is homebound due to:  Unable to leave home safely without assistance     Home Health    Complete by:  As directed   To provide the following care/treatments:  PT     Increase activity slowly    Complete by:  As directed             Medication List    STOP taking these medications        diphenoxylate-atropine 2.5-0.025 MG per tablet  Commonly known as:  LOMOTIL     DSS 100 MG Caps      TAKE these medications        acyclovir 400 MG tablet  Commonly known as:  ZOVIRAX  Take 1 tablet (400 mg total) by mouth 2 (two) times daily.     cetirizine 10 MG tablet  Commonly known as:  ZYRTEC  Take 10 mg by mouth daily.     cholecalciferol 1000 UNITS tablet  Commonly known as:  VITAMIN D  Take 1,000 Units by mouth daily.     [employer] GAS RELIEF 80 MG chewable tablet  Generic drug:  simethicone  CHEW 1 TABLET BY MOUTH EVERY 6 (SIX) HOURS AS NEEDED FOR FLATULENCE.     dexamethasone 2 MG tablet  Commonly known as:  DECADRON  Take 2 mg by mouth daily.     enoxaparin 100 MG/ML injection  Commonly known as:  LOVENOX  INJECT  0.95MLS (95MG  TOTAL) INTO THE SKIN DAILY     fluconazole 100 MG tablet  Commonly known as:  DIFLUCAN  Take 100 mg by mouth daily.     gabapentin 100 MG capsule  Commonly known as:  NEURONTIN  TAKE 1 CAPSULE BY MOUTH 3 (THREE) TIMES DAILY.     HYDROmorphone 2 MG tablet  Commonly known as:  DILAUDID  Take 0.5 tablets (1 mg total) by mouth every 4 (four) hours as needed.     levETIRAcetam 1000 MG tablet  Commonly known as:  KEPPRA  TAKE 1 TABLET (1,000 MG TOTAL) BY MOUTH EVERY 12 (TWELVE) HOURS.     lidocaine-prilocaine cream  Commonly known as:  EMLA  Apply 1 application topically daily as needed (prior to port being access). Apply to affected area once     methylphenidate 5 MG tablet  Commonly known as:  RITALIN  Take 1 tablet (5 mg total) by  mouth 2 (two) times daily with breakfast and lunch.     metroNIDAZOLE 500 MG tablet  Commonly known as:  FLAGYL  Take 1 tablet (500 mg total) by mouth every 8 (eight) hours.     mirtazapine 30 MG tablet  Commonly known as:  REMERON  Take 0.5 tablets (15 mg total) by mouth at bedtime.     multivitamin with minerals Tabs tablet  Take 1 tablet by mouth daily.     omeprazole 20 MG capsule  Commonly known as:  PRILOSEC  TAKE 1 CAPSULE (20 MG TOTAL) BY MOUTH 2 (TWO) TIMES DAILY BEFORE A MEAL.     ORAL GEL ANESTHETIC MT  Use as directed 1 application in the mouth or throat 4 (four) times daily as needed (mouth pain).     prochlorperazine 10 MG tablet  Commonly known as:  COMPAZINE  TAKE 1 TABLET (10 MG TOTAL) BY MOUTH EVERY 6 (SIX) HOURS AS NEEDED (NAUSEA OR VOMITING).     RA KRILL OIL PO  Take 1 capsule by mouth daily.     SORE THROAT SPRAY MT  Use as directed 4 sprays in the mouth or throat 4 (four) times daily as needed (mouth pain).     traZODone 50 MG tablet  Commonly known as:  DESYREL  TAKE 2 TABLETS BY MOUTH AT BEDTIME FOR SLEEP           Follow-up Information    Call [name], MD.   Specialty:  Internal Medicine   Why:  As needed   Contact information:   [address] [place] [address] [phone]       Follow up with Chauncey Cruel, MD.   Specialty:  Oncology   Why:  At your scheduled appt times noted below.   Contact information:   [place] [address] [phone]        Time coordinating discharge: 35 minutes.  Signed:  RAMA,CHRISTINA  Pager [phone] [facility] 11/14/2014, 3:47 PM

## 2014-11-15 LAB — CULTURE, BLOOD (ROUTINE X 2)
CULTURE: NO GROWTH
Culture: NO GROWTH

## 2014-11-17 ENCOUNTER — Other Ambulatory Visit: Payer: Self-pay | Admitting: Radiation Therapy

## 2014-11-17 DIAGNOSIS — C7931 Secondary malignant neoplasm of brain: Secondary | ICD-10-CM

## 2014-11-19 ENCOUNTER — Ambulatory Visit: Payer: BC Managed Care – PPO | Admitting: Radiation Oncology

## 2014-11-25 ENCOUNTER — Ambulatory Visit (HOSPITAL_BASED_OUTPATIENT_CLINIC_OR_DEPARTMENT_OTHER): Payer: BC Managed Care – PPO

## 2014-11-25 ENCOUNTER — Telehealth: Payer: Self-pay | Admitting: Oncology

## 2014-11-25 ENCOUNTER — Other Ambulatory Visit (HOSPITAL_BASED_OUTPATIENT_CLINIC_OR_DEPARTMENT_OTHER): Payer: BC Managed Care – PPO

## 2014-11-25 ENCOUNTER — Ambulatory Visit: Payer: BC Managed Care – PPO

## 2014-11-25 ENCOUNTER — Ambulatory Visit (HOSPITAL_BASED_OUTPATIENT_CLINIC_OR_DEPARTMENT_OTHER): Payer: BC Managed Care – PPO | Admitting: Oncology

## 2014-11-25 VITALS — BP 124/86 | HR 118 | Temp 99.1°F | Resp 18 | Ht 63.0 in | Wt 142.3 lb

## 2014-11-25 DIAGNOSIS — C50911 Malignant neoplasm of unspecified site of right female breast: Secondary | ICD-10-CM

## 2014-11-25 DIAGNOSIS — K1231 Oral mucositis (ulcerative) due to antineoplastic therapy: Secondary | ICD-10-CM

## 2014-11-25 DIAGNOSIS — Z5111 Encounter for antineoplastic chemotherapy: Secondary | ICD-10-CM

## 2014-11-25 DIAGNOSIS — C50919 Malignant neoplasm of unspecified site of unspecified female breast: Secondary | ICD-10-CM

## 2014-11-25 DIAGNOSIS — C773 Secondary and unspecified malignant neoplasm of axilla and upper limb lymph nodes: Secondary | ICD-10-CM

## 2014-11-25 DIAGNOSIS — C50411 Malignant neoplasm of upper-outer quadrant of right female breast: Secondary | ICD-10-CM

## 2014-11-25 DIAGNOSIS — C7931 Secondary malignant neoplasm of brain: Principal | ICD-10-CM

## 2014-11-25 DIAGNOSIS — C7800 Secondary malignant neoplasm of unspecified lung: Secondary | ICD-10-CM

## 2014-11-25 DIAGNOSIS — L271 Localized skin eruption due to drugs and medicaments taken internally: Secondary | ICD-10-CM

## 2014-11-25 DIAGNOSIS — C787 Secondary malignant neoplasm of liver and intrahepatic bile duct: Secondary | ICD-10-CM

## 2014-11-25 DIAGNOSIS — C7802 Secondary malignant neoplasm of left lung: Secondary | ICD-10-CM

## 2014-11-25 DIAGNOSIS — R197 Diarrhea, unspecified: Secondary | ICD-10-CM

## 2014-11-25 DIAGNOSIS — E46 Unspecified protein-calorie malnutrition: Secondary | ICD-10-CM

## 2014-11-25 DIAGNOSIS — Z171 Estrogen receptor negative status [ER-]: Secondary | ICD-10-CM

## 2014-11-25 DIAGNOSIS — C7801 Secondary malignant neoplasm of right lung: Secondary | ICD-10-CM | POA: Diagnosis not present

## 2014-11-25 DIAGNOSIS — A0472 Enterocolitis due to Clostridium difficile, not specified as recurrent: Secondary | ICD-10-CM

## 2014-11-25 DIAGNOSIS — Z95828 Presence of other vascular implants and grafts: Secondary | ICD-10-CM

## 2014-11-25 LAB — COMPREHENSIVE METABOLIC PANEL (CC13)
ALBUMIN: 2.8 g/dL — AB (ref 3.5–5.0)
ALT: 111 U/L — ABNORMAL HIGH (ref 0–55)
ANION GAP: 9 meq/L (ref 3–11)
AST: 140 U/L — AB (ref 5–34)
Alkaline Phosphatase: 551 U/L — ABNORMAL HIGH (ref 40–150)
BUN: 9.5 mg/dL (ref 7.0–26.0)
CO2: 24 mEq/L (ref 22–29)
CREATININE: 0.7 mg/dL (ref 0.6–1.1)
Calcium: 9.5 mg/dL (ref 8.4–10.4)
Chloride: 106 mEq/L (ref 98–109)
EGFR: >90 mL/min/{1.73_m2} (ref >90)
GLUCOSE: 136 mg/dL (ref 70–140)
POTASSIUM: 3.6 meq/L (ref 3.5–5.1)
Sodium: 139 mEq/L (ref 136–145)
Total Bilirubin: 0.58 mg/dL (ref 0.20–1.20)
Total Protein: 6.4 g/dL (ref 6.4–8.3)

## 2014-11-25 LAB — CBC WITH DIFFERENTIAL/PLATELET
BASO%: 0.2 % (ref 0.0–2.0)
Basophils Absolute: 0 10*3/uL (ref 0.0–0.1)
EOS%: 0.3 % (ref 0.0–7.0)
Eosinophils Absolute: 0 10*3/uL (ref 0.0–0.5)
HCT: 43 % (ref 34.8–46.6)
HEMOGLOBIN: 13.7 g/dL (ref 11.6–15.9)
LYMPH%: 8.9 % — ABNORMAL LOW (ref 14.0–49.7)
MCH: 33.1 pg (ref 25.1–34.0)
MCHC: 31.9 g/dL (ref 31.5–36.0)
MCV: 103.9 fL — ABNORMAL HIGH (ref 79.5–101.0)
MONO#: 1.3 10*3/uL — ABNORMAL HIGH (ref 0.1–0.9)
MONO%: 9.3 % (ref 0.0–14.0)
NEUT%: 81.3 % — AB (ref 38.4–76.8)
NEUTROS ABS: 11.7 10*3/uL — AB (ref 1.5–6.5)
Platelets: 345 10*3/uL (ref 145–400)
RBC: 4.14 10*6/uL (ref 3.70–5.45)
RDW: 19 % — AB (ref 11.2–14.5)
WBC: 14.4 10*3/uL — AB (ref 3.9–10.3)
lymph#: 1.3 10*3/uL (ref 0.9–3.3)
nRBC: 0 % (ref 0–0)

## 2014-11-25 MED ORDER — SODIUM CHLORIDE 0.9 % IJ SOLN
10.0000 mL | INTRAMUSCULAR | Status: DC | PRN
  Administered 2014-11-25: 10 mL via INTRAVENOUS
  Filled 2014-11-25: qty 10

## 2014-11-25 MED ORDER — METHYLPHENIDATE HCL 5 MG PO TABS: 5.0000 mg | ORAL_TABLET | Freq: Two times a day (BID) | ORAL | Status: DC

## 2014-11-25 MED ORDER — SODIUM CHLORIDE 0.9 % IJ SOLN
10.0000 mL | INTRAMUSCULAR | Status: DC | PRN
  Administered 2014-11-25: 10 mL
  Filled 2014-11-25: qty 10

## 2014-11-25 MED ORDER — SODIUM CHLORIDE 0.9 % IV SOLN
Freq: Once | INTRAVENOUS | Status: AC
  Administered 2014-11-25: 11:00:00 via INTRAVENOUS

## 2014-11-25 MED ORDER — HEPARIN SOD (PORK) LOCK FLUSH 100 UNIT/ML IV SOLN
500.0000 [IU] | Freq: Once | INTRAVENOUS | Status: AC | PRN
  Administered 2014-11-25: 500 [IU]
  Filled 2014-11-25: qty 5

## 2014-11-25 MED ORDER — HYDROMORPHONE HCL 4 MG/ML IJ SOLN
INTRAMUSCULAR | Status: AC
  Filled 2014-11-25: qty 1

## 2014-11-25 MED ORDER — HYDROMORPHONE HCL 2 MG PO TABS: 6.0000 mg | ORAL_TABLET | ORAL | Status: DC | PRN

## 2014-11-25 MED ORDER — DOXORUBICIN HCL LIPOSOMAL CHEMO INJECTION 2 MG/ML
43.0000 mg/m2 | Freq: Once | INTRAVENOUS | Status: AC
  Administered 2014-11-25: 70 mg via INTRAVENOUS
  Filled 2014-11-25: qty 35

## 2014-11-25 MED ORDER — HYDROMORPHONE HCL 1 MG/ML IJ SOLN
2.0000 mg | Freq: Once | INTRAMUSCULAR | Status: AC
  Administered 2014-11-25: 2 mg via INTRAVENOUS
  Filled 2014-11-25: qty 2

## 2014-11-25 MED ORDER — DEXAMETHASONE SODIUM PHOSPHATE 100 MG/10ML IJ SOLN
Freq: Once | INTRAMUSCULAR | Status: AC
  Administered 2014-11-25: 11:00:00 via INTRAVENOUS
  Filled 2014-11-25: qty 4

## 2014-11-25 NOTE — Patient Instructions (Signed)
[facility] Discharge Instructions for Patients Receiving Chemotherapy  Today you received the following chemotherapy agents Doxorubucin  To help prevent nausea and vomiting after your treatment, we encourage you to take your nausea medication     If you develop nausea and vomiting that is not controlled by your nausea medication, call the clinic.   BELOW ARE SYMPTOMS THAT SHOULD BE REPORTED IMMEDIATELY:  *FEVER GREATER THAN 100.5 F  *CHILLS WITH OR WITHOUT FEVER  NAUSEA AND VOMITING THAT IS NOT CONTROLLED WITH YOUR NAUSEA MEDICATION  *UNUSUAL SHORTNESS OF BREATH  *UNUSUAL BRUISING OR BLEEDING  TENDERNESS IN MOUTH AND THROAT WITH OR WITHOUT PRESENCE OF ULCERS  *URINARY PROBLEMS  *BOWEL PROBLEMS  UNUSUAL RASH Items with * indicate a potential emergency and should be followed up as soon as possible.  Feel free to call the clinic you have any questions or concerns. The clinic phone number is [phone].  Please show the [facility] at check-in to the Emergency Department and triage nurse.

## 2014-11-25 NOTE — Progress Notes (Signed)
Patient having increased pain when she arrives to infusion today.  Order for Dilaudid 2 mg IV, per Dr. [name].

## 2014-11-25 NOTE — Progress Notes (Signed)
Pt in for lab draw via [place]. Pt states she has been having a hard time with blood drawn from port. Pt was accessed without any difficulties. Flushes well with no blood return noted. Called and informed Erline Levine, Rn with Dr. Marjie Skiff. Pt to get labs drawn from peripherally today.

## 2014-11-25 NOTE — Telephone Encounter (Signed)
Gave avs & calendar for August, Sept, October

## 2014-11-25 NOTE — Patient Instructions (Signed)

## 2014-11-25 NOTE — Progress Notes (Signed)
. Patient ID: Carrie Berry, female   DOB: 1950/07/11, 64 y.o.   MRN: [id number] ID: Carrie Berry OB: 03-Nov-1950  MR#: [id number]  CSN#:[id number]  PCP: [name], MD GYN:   SU: Carrie Berry OTHER MD: Thea Silversmith, Earle Gell, Christene Slates, Heath Gold  CHIEF COMPLAINT:  Metastatic Breast Cancer CURRENT TREATMENT: Doxil  BREAST CANCER HISTORY: From the originally intake note:  Ciclaly noted a mass in her right breast December of 2013, but did not think much of it. It did grow some lower the summer, but she was keeping her grandchild at that time and was too busy so she did not bring it to Dr. Bernell List attention until August. He set her up for bilateral mammography and ultrasonography at [facility] 12/18/2012 and this measured, on the right, a large irregular lobulated mass in the upper outer quadrant which by ultrasound measured 5.7 cm. The right axilla showed some lymph nodes with thickened cortices. In the left breast there was a focal asymmetry at the depth, lateral to the nipple, and by ultrasound there was a 9 mm ill-defined hypoechoic lesion in this location. Biopsy of the left breast lesion showed only fibrocystic changes.  Biopsy of the right breast mass and right axillary adenopathy ([id number]) on 12/23/2012 showed both to be involved by invasive ductal carcinoma, grade 2, triple negative, with an MIB-1 of 86%.  MRI of the breast 12/27/2012 at [facility] imaging showed in the right breast a mass abutting the pectoralis muscle without enhancement of the muscle measuring 4.8 cm. The satellite nodule measuring approximately 3 mm was also noted superior to the mass and several abnormal and enlarged right axillary lymph nodes were noted, one of which appeared to be necrotic. The largest node measured 2.0 cm. Unfortunately, numerous bilateral pulmonary nodules were also noted.  The patient's subsequent history is as detailed below   INTERVAL HISTORY: Carrie Berry returns today  for follow-up of her metastatic breast cancer, accompanied by her husband Carrie Berry. Today is day 1 cycle 6 of liposomal doxorubicin, which she receives every 28 days. She has some palmar plantar erythrodysesthesia but "I can deal with that". She has had no symptoms suggestive of congestive heart failure. Since her last visit here she was hospitalized with C. difficile colitis. During that hospitalization CT scans were obtained which were compared to scans from April. There was some evidence of growth in her lung and liver lesions but she had just had an MRI of the liver, which in my opinion is more accurate, which showed stable disease in the liver. That is what were going with.  REVIEW OF SYSTEMS Carrie Berry did go to the beach but was not able to do very much there. She did enjoy it, just looking at the window, she says. She developed vertigo while at the beach but that has resolved. Continue symptom is pain chiefly across the midriff and involving both hips. She is taking dilated approximately 4 tablets daily (total of 8 mg) with poor control. She describes the pain as throbbing and aching and cramping as well. In addition to that she feels weak, depressed, and fatigued. Her appetite is poor. She has had no unusual headaches or visual changes. She denies nausea or vomiting. Denies constipation or any bladder issues. A detailed review of systems today was otherwise stable  PAST MEDICAL HISTORY: Past Medical History  Diagnosis Date  . Recurrent sinus infections   . Arthritis   . PONV (postoperative nausea and vomiting)   . GERD (  gastroesophageal reflux disease)   . 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 Anxiety     no problems at present  . Breast cancer   . 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 Radiation 07/16/14-07/22/14    Right chest wall nodule 20 Gy in 5 fractions  . Brain cancer     breast ca with brain mets  . 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 Tubal ligation  1985  . Portacath placement N/A 01/10/2013    Procedure: ULTRASOUND GUIDED PORT-A-CATH INSERTION WITH FLUOROSCOPY;  Surgeon: [name], MD;  Location: [place];  Service: General;  Laterality: N/A;  . Esophagogastroduodenoscopy N/A 08/19/2013    Procedure: ESOPHAGOGASTRODUODENOSCOPY (EGD);  Surgeon: Garlan Fair, MD;  Location: [facility];  Service: Endoscopy;  Laterality: N/A;  . Total mastectomy Right 02/17/2014    Procedure: PALLIATIVE  RIGHT MASTECTOMY;  Surgeon: Carrie Confer, MD;  Location: [place];  Service: General;  Laterality: Right;    FAMILY HISTORY Family History  Problem Relation Age of Onset  . Bladder Cancer Father   . Colon cancer Maternal Grandmother    the patient's parents are living, both in their 66s. The patient's father was diagnosed with bladder cancer the age of 1. The patient's mother's mother had some type of gastrointestinal cancer. The patient had one brother, no sisters. There is no history of breast or ovarian cancer in the family other than a cousin on the father's side who was diagnosed with breast cancer apparently before the age of 20.  GYNECOLOGIC HISTORY:   (Reviewed 10/13/2013) Menarche age 89, first live birth age 6, the patient is [place] P2. She went through menopause in 2008. She did not take hormone replacement. She took birth control for approximately 22 years remotely.  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 She's currently on disability. Her husband Carrie Berry"), is vice Development worker, international aid for [employer]. The patient's daughter Carrie Berry is a stay-at-home mom in [place], the patient's son Carrie Berry unfortunately died in an automobile accident in December of 2011. The patient has 2 grandchildren. She attends to a local [employer].    ADVANCED DIRECTIVES: Not in place   HEALTH MAINTENANCE: (Updated 10/13/2013) History  Substance Use Topics  . Smoking status: Never Smoker   . Smokeless tobacco: Never Used  . Alcohol Use: No     Colonoscopy: 2005  PAP: Not on file  Bone density: Not on file  Lipid panel:  Not on file/Dr. Polite   Allergies  Allergen Reactions  . Codeine Nausea And Vomiting    "violently ill"  . Penicillins Rash    Childhood reaction -    Current Outpatient Prescriptions  Medication Sig Dispense Refill  . acyclovir (ZOVIRAX) 400 MG tablet Take 1 tablet (400 mg total) by mouth 2 (two) times daily. 60 tablet 6  . Benzocaine (ORAL GEL ANESTHETIC MT) Use as directed 1 application in the mouth or throat 4 (four) times daily as needed (mouth pain).    . cetirizine (ZYRTEC) 10 MG tablet Take 10 mg by mouth daily.    . cholecalciferol (VITAMIN D) 1000 UNITS tablet Take 1,000 Units by mouth daily.    . [employer] GAS RELIEF 80 MG chewable tablet CHEW 1 TABLET BY MOUTH EVERY 6 (SIX) HOURS AS NEEDED  FOR FLATULENCE. 30 tablet 0  . dexamethasone (DECADRON) 2 MG tablet Take 2 mg by mouth daily.   4  . enoxaparin (LOVENOX) 100 MG/ML injection INJECT 0.95MLS (95MG  TOTAL) INTO THE SKIN DAILY 30 Syringe 0  . fluconazole (DIFLUCAN) 100 MG tablet Take 100 mg by mouth daily.    Marland Kitchen gabapentin (NEURONTIN) 100 MG capsule TAKE 1 CAPSULE BY MOUTH 3 (THREE) TIMES DAILY. 90 capsule 2  . HYDROmorphone (DILAUDID) 2 MG tablet Take 0.5 tablets (1 mg total) by mouth every 4 (four) hours as needed. 10 tablet 0  . levETIRAcetam (KEPPRA) 1000 MG tablet TAKE 1 TABLET (1,000 MG TOTAL) BY MOUTH EVERY 12 (TWELVE) HOURS. 60 tablet 4  . lidocaine-prilocaine (EMLA) cream Apply 1 application topically daily as needed (prior to port being access). Apply to affected area once 30 g 3  . methylphenidate (RITALIN) 5 MG tablet Take 1 tablet (5 mg total) by mouth 2 (two) times daily with breakfast and lunch. 60 tablet 0  . metroNIDAZOLE  (FLAGYL) 500 MG tablet Take 1 tablet (500 mg total) by mouth every 8 (eight) hours. 36 tablet 0  . mirtazapine (REMERON) 30 MG tablet Take 0.5 tablets (15 mg total) by mouth at bedtime. (Patient not taking: Reported on 11/10/2014) 30 tablet 1  . Multiple Vitamin (MULTIVITAMIN WITH MINERALS) TABS tablet Take 1 tablet by mouth daily.    Marland Kitchen omeprazole (PRILOSEC) 20 MG capsule TAKE 1 CAPSULE (20 MG TOTAL) BY MOUTH 2 (TWO) TIMES DAILY BEFORE A MEAL. 60 capsule 6  . Phenol (SORE THROAT SPRAY MT) Use as directed 4 sprays in the mouth or throat 4 (four) times daily as needed (mouth pain).    . prochlorperazine (COMPAZINE) 10 MG tablet TAKE 1 TABLET (10 MG TOTAL) BY MOUTH EVERY 6 (SIX) HOURS AS NEEDED (NAUSEA OR VOMITING). 30 tablet 1  . RA KRILL OIL PO Take 1 capsule by mouth daily.    . traZODone (DESYREL) 50 MG tablet TAKE 2 TABLETS BY MOUTH AT BEDTIME FOR SLEEP 60 tablet 0   No current facility-administered medications for this visit.   Facility-Administered Medications Ordered in Other Visits  Medication Dose Route Frequency Provider Last Rate Last Dose  . sodium chloride 0.9 % injection 10 mL  10 mL Intravenous PRN Chauncey Cruel, MD   10 mL at 11/25/14 8099    OBJECTIVE: Middle-aged white Berry examined in a wheelchair Filed Vitals:   11/25/14 0948  BP: 124/86  Pulse: 118  Temp: 99.1 F (37.3 C)  Resp: 18     Body mass index is 25.21 kg/(m^2).    ECOG FS: 2 Filed Weights   11/25/14 0948  Weight: 142 lb 4.8 oz (64.547 kg)   Sclerae unicteric, EOMs intact Oropharynx clear, no thrush or other lesions No cervical or supraclavicular adenopathy Lungs no rales or rhonchi Heart regular rate and rhythm Abd soft, slightly distended, nontender, positive bowel sounds MSK no focal spinal tenderness to palpation, despite discomfort reported in both hip areas, no upper extremity lymphedema Neuro: nonfocal and specifically normal knee extension and dorsiflexion bilaterally, with no sensory level.,  well oriented, appropriate affect Breasts:    LAB RESULTS:   Lab Results  Component Value Date   WBC 14.4* 11/25/2014   NEUTROABS 11.7* 11/25/2014   HGB 13.7 11/25/2014   HCT 43.0 11/25/2014   MCV 103.9* 11/25/2014   PLT 345 11/25/2014      Chemistry      Component Value Date/Time   NA 139 11/25/2014 0908  NA 137 11/14/2014 0550   K 3.6 11/25/2014 0908   K 4.3 11/14/2014 0550   CL 104 11/14/2014 0550   CO2 24 11/25/2014 0908   CO2 26 11/14/2014 0550   BUN 9.5 11/25/2014 0908   BUN <5* 11/14/2014 0550   CREATININE 0.7 11/25/2014 0908   CREATININE 0.53 11/14/2014 0550      Component Value Date/Time   CALCIUM 9.5 11/25/2014 0908   CALCIUM 8.6* 11/14/2014 0550   ALKPHOS 551* 11/25/2014 0908   ALKPHOS 373* 11/10/2014 1152   AST 140* 11/25/2014 0908   AST 102* 11/10/2014 1152   ALT 111* 11/25/2014 0908   ALT 126* 11/10/2014 1152   BILITOT 0.58 11/25/2014 0908   BILITOT 0.7 11/10/2014 1152        STUDIES: Dg Chest 2 View  11/10/2014   CLINICAL DATA:  Nausea and weakness  EXAM: CHEST - 2 VIEW  COMPARISON:  08/27/2014  FINDINGS: Right-sided chest wall port is again noted. The cardiac shadow is stable. Persistent left basilar changes are seen laterally. No new focal infiltrate is seen. The known pulmonary nodules are not well visualized on this exam. This is predominately related to their size. No other focal abnormality is seen.  IMPRESSION: Stable left basilar changes.   Electronically Signed   By: Inez Catalina M.D.   On: 11/10/2014 11:17   Ct Head W Wo Contrast  11/10/2014   CLINICAL DATA:  Nausea, vomiting, and weakness beginning this morning. History of metastatic breast cancer.  EXAM: CT HEAD WITHOUT AND WITH CONTRAST  TECHNIQUE: Contiguous axial images were obtained from the base of the skull through the vertex without and with intravenous contrast  CONTRAST:  16mL OMNIPAQUE IOHEXOL 300 MG/ML  SOLN  COMPARISON:  Brain MRI 10/30/2014  FINDINGS: There is no evidence  of acute large territory infarct, acute intracranial hemorrhage, midline shift, or extra-axial fluid collection. There is mild generalized cerebral atrophy. Extensive hypodensities in the cerebral white matter correspond to the T2 hyperintensities on recent MRI and are nonspecific but may reflect postradiation changes. 10 mm ring-enhancing lesion in the right cerebellum is unchanged, as is a 6 mm lesion in the right parietal lobe. Other, punctate enhancing lesions on the prior brain MRI are not well seen on this CT.  Orbits are unremarkable. Small bilateral mastoid effusions are noted. Visualized mastoid air cells are clear.  IMPRESSION: 1. No evidence of acute intracranial abnormality. 2. Unchanged, small right parietal and right cerebellar metastases. No new metastases or mass effect or identified.   Electronically Signed   By: Logan Bores   On: 11/10/2014 13:16   Mr Brain W Wo Contrast  10/30/2014   CLINICAL DATA:  Right breast cancer with brain metastases. Whole-brain radiation completed 03/2014. Ongoing chemotherapy.  EXAM: MRI HEAD WITHOUT AND WITH CONTRAST  TECHNIQUE: Multiplanar, multiecho pulse sequences of the brain and surrounding structures were obtained without and with intravenous contrast.  CONTRAST:  21mL MULTIHANCE GADOBENATE DIMEGLUMINE 529 MG/ML IV SOLN  COMPARISON:  07/20/2014  FINDINGS: There is no evidence of acute infarct, or extra-axial fluid collection. Thigh there is mild global cerebral atrophy. Patchy and confluent T2 hyperintensities throughout the cerebral white matter bilaterally have mildly progressed from the prior study and most likely reflect postradiation changes. Trace leftward midline shift is unchanged.  10 mm enhancing right cerebellar lesion (series 10, image 34), 2 mm left occipital lesion (series 10, image 56), 2 mm right occipital lesion near the occipital horn (series 10, image 71), 9 mm right  temporal occipital lesion (series 10, image 70), 3 mm enhancing lesion  posterior to the atrium of the right lateral ventricle (series 10, image 78), 6 mm right parietal lesion (series 10, image 89), 1 mm posterior right parietal lesion (series 10, image 90), 2 mm right frontoparietal lesion (series 10, image 95), 2 mm posterior right frontal lesion (series 10, image 113), 2 mm anterior right frontal lesion (series 10, image 115), and 2 mm posterior left frontal lesion (series 10, image 130) are all unchanged.  There is a 2 mm punctate focus of enhancement in the right thalamus which was not clearly present on the prior study (series 10, image 73 and series 11, image 22). There is a small amount of chronic blood products associated with a few of the lesions, most notably the 6 mm right parietal lesion.  Orbits are unremarkable. Minimal bilateral maxillary sinus mucosal thickening and moderate bilateral mastoid effusions are noted. Major intracranial vascular flow voids are preserved. Prominent fatty marrow throughout the skull is consistent with prior radiation therapy.  IMPRESSION: 1. New punctate 2 mm lesion in the right thalamus. 2. Unchanged appearance of other metastases. 3. Progressive white matter changes, most likely related to prior radiation therapy.   Electronically Signed   By: Logan Bores   On: 10/30/2014 15:35   Ct Abdomen Pelvis W Contrast  11/10/2014   CLINICAL DATA:  Breast cancer diagnosed 2013 with metastases. Last chemotherapy earlier this month. Nausea, vomiting, weakness. Abdominal pain.  EXAM: CT ABDOMEN AND PELVIS WITH CONTRAST  TECHNIQUE: Multidetector CT imaging of the abdomen and pelvis was performed using the standard protocol following bolus administration of intravenous contrast.  CONTRAST:  165mL OMNIPAQUE IOHEXOL 300 MG/ML  SOLN  COMPARISON:  MRI 11/02/2014.  CT 08/27/2014.  FINDINGS: Enlarging bilateral lower lobe pulmonary nodules. 11 mm right lower lobe pulmonary nodule on image 8. Adjacent nodules in the left lower lobe on image 11 each measure 14  mm. Trace right pleural effusion. Heart is normal size.  Small hypodensities scattered throughout the liver, best seen in the left hepatic lobe on image 28, 31, 34 and in the right hepatic lobe on image 32, 35. These are new or enlarging since prior study concerning for worsening metastases. Right hepatic biliary ductal dilatation is again noted, similar to prior study.  Pancreas, spleen, adrenals and kidneys are unremarkable.  Stomach, large and small bowel are unremarkable. Uterus, adnexae and urinary bladder unremarkable. Free fluid, free air or adenopathy. No acute bony abnormality.  IMPRESSION: Enlarging bilateral lower lobe pulmonary nodules compatible with worsening metastatic disease.  New or enlarging low-density lesions within the liver compatible with worsening metastatic disease.   Electronically Signed   By: Rolm Baptise M.D.   On: 11/10/2014 14:15   Mr Liver W Wo Contrast  11/02/2014   CLINICAL DATA:  Metastatic breast cancer with liver metastasis.  EXAM: MRI ABDOMEN WITHOUT AND WITH CONTRAST  TECHNIQUE: Multiplanar multisequence MR imaging of the abdomen was performed both before and after the administration of intravenous contrast.  CONTRAST:  51mL MULTIHANCE GADOBENATE DIMEGLUMINE 529 MG/ML IV SOLN  COMPARISON:  MRI abdomen 08/14/2014, PET-CT 01/07/2013. CT abdomen 08/27/2014  FINDINGS: Lower chest: There is a 1.5 cm nodule in the left lower lobe seen on coronal T2 weighted series 6 (image 7).  Hepatobiliary: In comparing the post-contrast T1 weighted series on today's MRI to MRI 08/14/2014 there are no new enhancing lesions. The subcapsular lesion in the anterior central left hepatic lobe (image segment 4A) measures  14 mm on image 34 of series 1101 compared to 17 mm. A cluster of enhancing lesion in the subcapsular right hepatic lobe measures 22 x 17 mm on image 34 of same series compared to 23 x 21 mm on prior. Lesion inferior right hepatic lobe on image 49 measures 6 mm compared to 6 mm.   There is again demonstrated intrahepatic biliary duct dilatation in the right hepatic lobe. There is a likely central obstructing lesion but no discrete measurable lesions identified.  The degree of biliary duct dilatation appears appears slightly increased. For example increase duct dilatation inferior right hepatic lobe on image 53 appears increased. Central dilatation on image 37 in the right hepatic lobe appears increased.  Portal veins are patent.  Pancreas : No pancreatic lesion.  No pancreatic duct dilatation.  Spleen: Normal spleen.  Adrenals/urinary tract: Adrenal glands and kidneys are normal.  Stomach/Bowel: Stomach and limited of the small bowel is unremarkable  Vascular/Lymphatic: Abdominal aortic normal caliber. No retroperitoneal periportal lymphadenopathy.  Musculoskeletal: No aggressive osseous lesion  IMPRESSION: 1. No new hepatic lesions. 2. Enhancing hepatic metastasis in the right hepatic lobe appear are similar to comparison exam to mildly decreased. 3. The degree of biliary ductal dilatation in the right hepatic lobe appears slightly increased. Central obstructing lesion is not identified. 4. Consider FDG PET scan to evaluate for residual metabolic active disease. Central obstructing biliary lesion may also be identified. 5. Again demonstrated a right lower lobe nodule seen on the coronal imaging. This could also be evaluated by FDG PET scan.   Electronically Signed   By: Suzy Bouchard M.D.   On: 11/02/2014 17:31   CLINICAL DATA: Systolic chest heart failure. Evaluate cardiac function prior to chemotherapy.  EXAM: NUCLEAR MEDICINE CARDIAC BLOOD POOL IMAGING (MUGA)  TECHNIQUE: Cardiac multi-gated acquisition was performed at rest following intravenous injection of Tc-68m labeled red blood cells.  RADIOPHARMACEUTICALS: Twenty-four mCiTc-22m in-vitro labeled red blood cells.  COMPARISON: None.  FINDINGS: No focal wall motion abnormality of the left  ventricle.  Calculated left ventricular ejection fraction equals 63%  IMPRESSION: Left ventricular ejection fraction equals 63%    ASSESSMENT: 64 y.o. Carrie Berry, Carrie Berry status post right breast upper outer quadrant and right axillary lymph node biopsy 12/23/2012 for a clinical T3 N1 M1, stage IV invasive ductal carcinoma, grade 3, triple negative, with an MIB-1 of 86%.  (1) right liver lobe biopsy 01/21/2013 confirms metastatic adenocarcinoma; scans show involvement of the liver, lungs, and likely bone.  (2) enrolled in [facility] study [id number] (docetaxel + oral gamma secretase inibitor [id number]), received one cycle starting 02/04/2013  but withdrew because of poor tolerance despite treatment interruptions and decreased dosing of the oral component  (3)  chest CT in early November 2014 was compared with studies in August 2014 and showed interval progression of metastatic breast cancer, with an enlarging primary right breast mass, enlarging right axillary lymph nodes, enlarging pulmonary nodules and new/enlarging hepatic metastases.  (4) Abraxane started 03/19/2013, given day 1 and day 8 of each 21 day cycle, stopped with the day 1 cycle 3 dose (04/29/2013) because of local progression of disease  (5) cyclophosphamide and doxorubicin started 05/20/2013, given in dose dense fashion  with Neulasta support on day 2. Completed 4 cycles 07/03/2013, with the final cycle dose reduced 15% because of an episode of febrile neutropenia after cycle 3. Restaging studies 07/15/2013 showed partial response.  (6) started capecitabine April 2015, 1.5 g by mouth twice a day, 7 days on 7 days  off--discontinued October 2015 with progression  (7) s/p Right simple mastectomy 02/17/2014 to optimize local control, the pathology ([id number]) showing an invasive ductal carcinoma measuring 4.8 cm, grade 3, with close but negative margins and evidence of treatment response   (a) nodule on right mastectomy scar noted  04/03/2014, removed 04/14/2014  (b) new nodule noted on R chest scar 05/19/2014  (c) radiation to R chest wall completed 07/22/2014  (8) multiple brain metastases noted on brain MRI 02/26/2014--s/p whole brain irradiation completed 03/17/2014  (a) repeat brain MRI 05/12/2014 shows favorable response  (b) repeat brain MRI 07/15/2014 shows questionable growth/ stable disease  (9) protein-calorie malnutrition  (10) high fall risk  (11) advanced directives: not in place; husband is healthcare power of attorney  (12) unexplained paroxysmal knee pain: possible ruptured Baker's cyst, Left, 05/01/2014  (13) doxil started 05/28/2014, repeated every 28 days. Cycle 3 delayed 2 weeks because of decrease in ejection fraction, which proved spurious (MUGA results 63% EF). Doxil esumed 04//11/2014 with evidence of response  (a) MRI of the liver sixth 20 2016 shows stable disease  (14) c difficile colitis 11/12/2014, responding to metronidazole   PLAN: Kyan is over the Clostridium difficile. We are going to check a stool just in case to confirm. She is having symptoms from the Doxil, including mucositis and some palmar or plantar erythrodysesthesia. I'm not sure were going to be able to continue this drug much longer but we will treat today and at least one more cycle. It has held her liver lesions under control.  She is already scheduled for brain MRI late August and to see Dr. Tammi Klippel shortly after that. She is going to see me again in September 7. At that time we will consider switching to CMF, assuming there has been no further progression in the brain.  In the meantime I am obtaining a bone scan to see if we can better evaluate the increased pain in her back. Exam today does not suggest cord compression. I have asked her to increase her dialogue it to 6 mg 4 times a day and she understands this may constipate her and she will take MiraLAX and Dr. Marianna Fuss as needed to control  Haddie has a good  understanding of this plan. She agrees with it. She knows to call for any problems that may develop before the next visit here. Chauncey Cruel, MD   11/25/2014 10:17 AM                                                                                                                                                                                                                                                                                                                                                                                                                                                                                                                                                                                                                                                                                                                                                                                                                                                                                                                                                                                                                                                                                                                                                                                                                                                                                                                                                                                                                                                                                                                                                                                                                                                                                                                                                                   `

## 2014-11-30 ENCOUNTER — Other Ambulatory Visit (HOSPITAL_COMMUNITY)
Admission: RE | Admit: 2014-11-30 | Discharge: 2014-11-30 | Disposition: A | Discharge status: Home / Self Care | Payer: BC Managed Care – PPO | Source: Ambulatory Visit | Attending: Oncology | Admitting: Oncology

## 2014-11-30 ENCOUNTER — Other Ambulatory Visit: Payer: Self-pay | Admitting: *Deleted

## 2014-11-30 ENCOUNTER — Other Ambulatory Visit: Payer: Self-pay | Admitting: Oncology

## 2014-11-30 ENCOUNTER — Encounter: Payer: Self-pay | Admitting: *Deleted

## 2014-11-30 DIAGNOSIS — C50919 Malignant neoplasm of unspecified site of unspecified female breast: Secondary | ICD-10-CM | POA: Diagnosis not present

## 2014-11-30 LAB — CLOSTRIDIUM DIFFICILE BY PCR: CDIFFPCR: POSITIVE — AB

## 2014-11-30 MED ORDER — VANCOMYCIN HCL 125 MG PO CAPS: 125.0000 mg | ORAL_CAPSULE | Freq: Four times a day (QID) | ORAL | Status: DC

## 2014-11-30 NOTE — Progress Notes (Unsigned)
Case C. difficile is still positive. I am going to switch her to vancomycin 125 mg by mouth 4 times a day for 10 days. She will let us know if she has any problems from this. Otherwise we will check another stool sample when she returns to see me 01/20/2015.

## 2014-11-30 NOTE — Progress Notes (Unsigned)
Critical results for Clostridium Difficile given to Hinda Lenis, RN

## 2014-12-04 ENCOUNTER — Encounter (HOSPITAL_COMMUNITY)
Admission: RE | Admit: 2014-12-04 | Discharge: 2014-12-04 | Disposition: A | Discharge status: Home / Self Care | Payer: BC Managed Care – PPO | Source: Ambulatory Visit | Attending: Oncology | Admitting: Oncology

## 2014-12-04 DIAGNOSIS — C7931 Secondary malignant neoplasm of brain: Secondary | ICD-10-CM

## 2014-12-04 DIAGNOSIS — C787 Secondary malignant neoplasm of liver and intrahepatic bile duct: Secondary | ICD-10-CM

## 2014-12-04 DIAGNOSIS — C50919 Malignant neoplasm of unspecified site of unspecified female breast: Secondary | ICD-10-CM | POA: Insufficient documentation

## 2014-12-04 DIAGNOSIS — C7800 Secondary malignant neoplasm of unspecified lung: Secondary | ICD-10-CM

## 2014-12-04 DIAGNOSIS — A0472 Enterocolitis due to Clostridium difficile, not specified as recurrent: Secondary | ICD-10-CM

## 2014-12-04 DIAGNOSIS — R197 Diarrhea, unspecified: Secondary | ICD-10-CM

## 2014-12-04 MED ORDER — TECHNETIUM TC 99M MEDRONATE IV KIT
27.4000 | PACK | Freq: Once | INTRAVENOUS | Status: AC | PRN
  Administered 2014-12-04: 27.4 via INTRAVENOUS

## 2014-12-15 ENCOUNTER — Telehealth: Payer: Self-pay | Admitting: *Deleted

## 2014-12-15 NOTE — Telephone Encounter (Signed)
"  Carrie Berry, I'm faxing [name]'s short term disability earnings form.  The front page doesn't have any dates.  I've highlighted four blank places that needs months and dates."  This voicemail retrieved from spouse in reference to this patient.  Called him requesting name and D.O.B.  Will notify staff.

## 2014-12-16 NOTE — Telephone Encounter (Signed)
Carrie Berry, can you locate this and help complete it? I'll be glad to sign it  Thanks!

## 2014-12-19 ENCOUNTER — Other Ambulatory Visit: Payer: Self-pay | Admitting: Oncology

## 2014-12-23 ENCOUNTER — Other Ambulatory Visit (HOSPITAL_BASED_OUTPATIENT_CLINIC_OR_DEPARTMENT_OTHER): Payer: BC Managed Care – PPO

## 2014-12-23 ENCOUNTER — Other Ambulatory Visit: Payer: Self-pay | Admitting: Nurse Practitioner

## 2014-12-23 ENCOUNTER — Telehealth: Payer: Self-pay | Admitting: Nurse Practitioner

## 2014-12-23 ENCOUNTER — Ambulatory Visit (HOSPITAL_BASED_OUTPATIENT_CLINIC_OR_DEPARTMENT_OTHER): Payer: BC Managed Care – PPO

## 2014-12-23 ENCOUNTER — Ambulatory Visit (HOSPITAL_BASED_OUTPATIENT_CLINIC_OR_DEPARTMENT_OTHER): Payer: BC Managed Care – PPO | Admitting: Nurse Practitioner

## 2014-12-23 ENCOUNTER — Other Ambulatory Visit: Payer: Self-pay | Admitting: *Deleted

## 2014-12-23 ENCOUNTER — Encounter: Payer: Self-pay | Admitting: Nurse Practitioner

## 2014-12-23 ENCOUNTER — Ambulatory Visit: Payer: BC Managed Care – PPO

## 2014-12-23 VITALS — BP 115/73 | HR 117 | Temp 99.5°F | Resp 18

## 2014-12-23 DIAGNOSIS — Z7901 Long term (current) use of anticoagulants: Secondary | ICD-10-CM

## 2014-12-23 DIAGNOSIS — Z95828 Presence of other vascular implants and grafts: Secondary | ICD-10-CM

## 2014-12-23 DIAGNOSIS — C7931 Secondary malignant neoplasm of brain: Secondary | ICD-10-CM

## 2014-12-23 DIAGNOSIS — A047 Enterocolitis due to Clostridium difficile: Secondary | ICD-10-CM | POA: Diagnosis not present

## 2014-12-23 DIAGNOSIS — C50411 Malignant neoplasm of upper-outer quadrant of right female breast: Secondary | ICD-10-CM

## 2014-12-23 DIAGNOSIS — R109 Unspecified abdominal pain: Secondary | ICD-10-CM

## 2014-12-23 DIAGNOSIS — M545 Low back pain, unspecified: Secondary | ICD-10-CM | POA: Insufficient documentation

## 2014-12-23 DIAGNOSIS — C50911 Malignant neoplasm of unspecified site of right female breast: Secondary | ICD-10-CM

## 2014-12-23 DIAGNOSIS — C7801 Secondary malignant neoplasm of right lung: Secondary | ICD-10-CM

## 2014-12-23 DIAGNOSIS — C50919 Malignant neoplasm of unspecified site of unspecified female breast: Secondary | ICD-10-CM

## 2014-12-23 DIAGNOSIS — C773 Secondary and unspecified malignant neoplasm of axilla and upper limb lymph nodes: Secondary | ICD-10-CM

## 2014-12-23 DIAGNOSIS — C7802 Secondary malignant neoplasm of left lung: Secondary | ICD-10-CM

## 2014-12-23 DIAGNOSIS — R740 Nonspecific elevation of levels of transaminase and lactic acid dehydrogenase [LDH]: Secondary | ICD-10-CM

## 2014-12-23 DIAGNOSIS — C787 Secondary malignant neoplasm of liver and intrahepatic bile duct: Secondary | ICD-10-CM

## 2014-12-23 DIAGNOSIS — R7401 Elevation of levels of liver transaminase levels: Secondary | ICD-10-CM

## 2014-12-23 DIAGNOSIS — E8809 Other disorders of plasma-protein metabolism, not elsewhere classified: Secondary | ICD-10-CM

## 2014-12-23 DIAGNOSIS — A0472 Enterocolitis due to Clostridium difficile, not specified as recurrent: Secondary | ICD-10-CM

## 2014-12-23 DIAGNOSIS — Z86718 Personal history of other venous thrombosis and embolism: Secondary | ICD-10-CM

## 2014-12-23 LAB — COMPREHENSIVE METABOLIC PANEL (CC13)
ALBUMIN: 2.7 g/dL — AB (ref 3.5–5.0)
ALT: 140 U/L — ABNORMAL HIGH (ref 0–55)
AST: 120 U/L — ABNORMAL HIGH (ref 5–34)
Alkaline Phosphatase: 838 U/L — ABNORMAL HIGH (ref 40–150)
Anion Gap: 10 mEq/L (ref 3–11)
BILIRUBIN TOTAL: 0.67 mg/dL (ref 0.20–1.20)
BUN: 13.3 mg/dL (ref 7.0–26.0)
CO2: 25 mEq/L (ref 22–29)
CREATININE: 0.7 mg/dL (ref 0.6–1.1)
Calcium: 9.7 mg/dL (ref 8.4–10.4)
Chloride: 105 mEq/L (ref 98–109)
Glucose: 109 mg/dl (ref 70–140)
Potassium: 3.9 mEq/L (ref 3.5–5.1)
Sodium: 140 mEq/L (ref 136–145)
Total Protein: 6.7 g/dL (ref 6.4–8.3)

## 2014-12-23 LAB — CBC WITH DIFFERENTIAL/PLATELET
BASO%: 0.7 % (ref 0.0–2.0)
Basophils Absolute: 0.1 10*3/uL (ref 0.0–0.1)
EOS%: 0.1 % (ref 0.0–7.0)
Eosinophils Absolute: 0 10*3/uL (ref 0.0–0.5)
HCT: 40.3 % (ref 34.8–46.6)
HGB: 13.3 g/dL (ref 11.6–15.9)
LYMPH%: 14.5 % (ref 14.0–49.7)
MCH: 33.7 pg (ref 25.1–34.0)
MCHC: 33 g/dL (ref 31.5–36.0)
MCV: 102 fL — AB (ref 79.5–101.0)
MONO#: 1.4 10*3/uL — ABNORMAL HIGH (ref 0.1–0.9)
MONO%: 14.4 % — AB (ref 0.0–14.0)
NEUT#: 6.6 10*3/uL — ABNORMAL HIGH (ref 1.5–6.5)
NEUT%: 70.3 % (ref 38.4–76.8)
PLATELETS: 234 10*3/uL (ref 145–400)
RBC: 3.95 10*6/uL (ref 3.70–5.45)
RDW: 18.1 % — ABNORMAL HIGH (ref 11.2–14.5)
WBC: 9.4 10*3/uL (ref 3.9–10.3)
lymph#: 1.4 10*3/uL (ref 0.9–3.3)

## 2014-12-23 LAB — TECHNOLOGIST REVIEW

## 2014-12-23 MED ORDER — SODIUM CHLORIDE 0.9 % IJ SOLN
10.0000 mL | INTRAMUSCULAR | Status: DC | PRN
  Administered 2014-12-23: 10 mL via INTRAVENOUS
  Filled 2014-12-23: qty 10

## 2014-12-23 MED ORDER — HEPARIN SOD (PORK) LOCK FLUSH 100 UNIT/ML IV SOLN
500.0000 [IU] | INTRAVENOUS | Status: AC | PRN
Start: 2014-12-23 — End: 2014-12-23
  Administered 2014-12-23: 500 [IU]
  Filled 2014-12-23: qty 5

## 2014-12-23 MED ORDER — TRAZODONE HCL 50 MG PO TABS: ORAL_TABLET | ORAL | Status: DC

## 2014-12-23 MED ORDER — SODIUM CHLORIDE 0.9 % IV SOLN
Freq: Once | INTRAVENOUS | Status: AC
  Administered 2014-12-23: 12:00:00 via INTRAVENOUS

## 2014-12-23 MED ORDER — HYDROMORPHONE HCL 1 MG/ML IJ SOLN
2.0000 mg | Freq: Once | INTRAMUSCULAR | Status: DC
  Filled 2014-12-23: qty 2

## 2014-12-23 MED ORDER — HYDROMORPHONE HCL 4 MG/ML IJ SOLN
INTRAMUSCULAR | Status: AC
  Filled 2014-12-23: qty 1

## 2014-12-23 MED ORDER — METHYLPHENIDATE HCL 5 MG PO TABS: 5.0000 mg | ORAL_TABLET | Freq: Two times a day (BID) | ORAL | Status: DC

## 2014-12-23 MED ORDER — SODIUM CHLORIDE 0.9 % IJ SOLN
10.0000 mL | INTRAMUSCULAR | Status: AC | PRN
  Administered 2014-12-23: 10 mL
  Filled 2014-12-23: qty 10

## 2014-12-23 MED ORDER — HYDROMORPHONE HCL 4 MG/ML IJ SOLN
2.0000 mg | Freq: Once | INTRAMUSCULAR | Status: AC
  Administered 2014-12-23: 2 mg via INTRAVENOUS

## 2014-12-23 MED ORDER — VANCOMYCIN HCL 125 MG PO CAPS: 125.0000 mg | ORAL_CAPSULE | Freq: Four times a day (QID) | ORAL | Status: DC

## 2014-12-23 MED ORDER — HYDROMORPHONE HCL 2 MG PO TABS: 6.0000 mg | ORAL_TABLET | ORAL | Status: DC | PRN

## 2014-12-23 NOTE — Assessment & Plan Note (Signed)
Patient received cycle 6 of her Doxil chemotherapy on 11/25/2014.  Patient presented to the [facility] today to receive her next cycle of Doxil chemotherapy.  Patient reports increased generalized abdominal discomfort and cramping.  She has also had a temperature to the maximum of 99.5 recently.  She denies any significant diarrhea.  She is also complaining of some nausea; but no vomiting.  Patient has a history of recent C. difficile.  She just completed vancomycin oral antibiotics for tx of c diff.   Blood counts remained stable this week.  However, alkaline phosphatase has increased from 551 up to 838.  Also, both AST and ALT remain elevated.  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  Patient has plans to return on 01/12/2015 for brain MRI.  Patient is scheduled to return for labs, visit, and her next cycle of chemotherapy on 01/20/2015.  Will also order a lumbar MRI for further evaluation of continued complaint of lumbar back pain.

## 2014-12-23 NOTE — Assessment & Plan Note (Signed)
Patient has a history of a DVT in the past; and continues to take Lovenox injections on a daily basis.

## 2014-12-23 NOTE — Assessment & Plan Note (Signed)
Albumen Has decreased from 2.8 down to 2.7.  Patient was encouraged to push protein in her diet is much as possible.

## 2014-12-23 NOTE — Patient Instructions (Signed)
   Return C. Diff kit to lab at [facility].  Start vancomycin.  Radiology will call with MRI appointment.      Dehydration, Adult Dehydration means your body does not have as much fluid as it needs. Your kidneys, brain, and heart will not work properly without the right amount of fluids and salt.  HOME CARE  Ask your doctor how to replace body fluid losses (rehydrate).  Drink enough fluids to keep your pee (urine) clear or pale yellow.  Drink small amounts of fluids often if you feel sick to your stomach (nauseous) or throw up (vomit).  Eat like you normally do.  Avoid:  Foods or drinks high in sugar.  Bubbly (carbonated) drinks.  Juice.  Very hot or cold fluids.  Drinks with caffeine.  Fatty, greasy foods.  Alcohol.  Tobacco.  Eating too much.  Gelatin desserts.  Wash your hands to avoid spreading germs (bacteria, viruses).  Only take medicine as told by your doctor.  Keep all doctor visits as told. GET HELP RIGHT AWAY IF:   You cannot drink something without throwing up.  You get worse even with treatment.  Your vomit has blood in it or looks greenish.  Your poop (stool) has blood in it or looks black and tarry.  You have not peed in 6 to 8 hours.  You pee a small amount of very dark pee.  You have a fever.  You pass out (faint).  You have belly (abdominal) pain that gets worse or stays in one spot (localizes).  You have a rash, stiff neck, or bad headache.  You get easily annoyed, sleepy, or are hard to wake up.  You feel weak, dizzy, or very thirsty. MAKE SURE YOU:   Understand these instructions.  Will watch your condition.  Will get help right away if you are not doing well or get worse. Document Released: 02/25/2009 Document Revised: 07/24/2011 Document Reviewed: 12/19/2010 [facility] Patient Information 2015 [place], [place]. This information is not intended to replace advice given to you by your health care provider. Make sure  you discuss any questions you have with your health care provider.

## 2014-12-23 NOTE — Assessment & Plan Note (Signed)
Alkaline phosphatase has increased from 551 and 838.  Will continue to monitor closely.

## 2014-12-23 NOTE — Progress Notes (Signed)
Pt presented with questions regarding bone scan results. She has not received results. Complains of ongoing back pain and lower extremity weakness. RN noted patient had difficulty transferring from wheelchair to recliner.  Pt also c/o abdominal pain consistent with prior pain that resulted in C. Diff diagnosis. Noted C Diff positive 3 weeks ago. Denies diarrhea.  RN notified Selena Lesser, NP of above. Cyndee evaluated patient and orders received to hold chemo today, give IV pain med and fluids. MRI and followup with patient's MD will be scheduled.

## 2014-12-23 NOTE — Assessment & Plan Note (Signed)
Patient reports increased generalized abdominal discomfort and cramping.  She has also had a temperature to the maximum of 99.5 recently.  She denies any significant diarrhea.  She is also complaining of some nausea; but no vomiting.  Patient has a history of recent C. difficile.  Patient states that she took Flagyl antibiotics for treatment of her C. difficile; and it was ineffective.  She just completed vancomycin oral antibiotics for tx of c diff.   Have ordered a repeat stool for testing of C. difficile.  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

## 2014-12-23 NOTE — Assessment & Plan Note (Signed)
AST elevated to 120 and ALT elevated 140.  Will continue to monitor closely.

## 2014-12-23 NOTE — Assessment & Plan Note (Signed)
Patient has been complaining of some chronic lumbar back pain for the past 4-5 weeks.  She denies any known injury or trauma to the area.  Patient denies any numbness or tingling to her lower extremities.  She also denies any new onset weakness.  She denies any urinary or bowel incontinence.  Patient underwent a whole-body bone scan on 12/04/2014 for further evaluation of her lumbar pain.  Bone scan revealed:  Increased activity noted over the L1 region. This is suspicious for active process such as compression fracture or metastatic disease. No abnormality noted in this region on prior CT of 11/10/2014. Gadolinium-enhanced MRI of the lumbar spine can be obtained to further evaluate  Reviewed these findings with both patient and her husband today.  The plan is to obtain a lumbar MRI as soon as possible for further evaluation and management.  Confirmed that patient does have a medication at home to take on an as-needed basis.

## 2014-12-23 NOTE — Telephone Encounter (Signed)
Called and left a message with a new appointment °

## 2014-12-23 NOTE — Patient Instructions (Signed)

## 2014-12-23 NOTE — Progress Notes (Signed)
SYMPTOM MANAGEMENT CLINIC   HPI: Carrie Berry 64 y.o. female diagnosed with breast cancer; with metastasis to the brain, liver, and lungs.  Currently undergoing Doxil chemotherapy regimen.  Patient presented to the [facility] today to receive her next cycle of Doxil chemotherapy.  However, patient is complaining of increased generalized abdominal discomfort and cramping for the past week or so.  She denies any actual diarrhea.  She also denies any nausea or vomiting.  She reports having a fever to a maximal of 90.5 recently.  Patient has been diagnosed within the past 6-8 weeks with C. difficile.  She initially tried Flagyl antibiotic; but was then given vancomycin oral antibiotic for treatment of her C. difficile.  Most recent stool for C. difficile was positive.  3 weeks ago.  Patient has been complaining of some chronic lumbar back pain for the past 4-5 weeks.  She denies any known injury or trauma to the area.  Patient denies any numbness or tingling to her lower extremities.  She also denies any new onset weakness.  She denies any urinary or bowel incontinence.  Patient underwent a whole-body bone scan on 12/04/2014 for further evaluation of her lumbar pain.  Bone scan revealed:  Increased activity noted over the L1 region. This is suspicious for active process such as compression fracture or metastatic disease. No abnormality noted in this region on prior CT of 11/10/2014. Gadolinium-enhanced MRI of the lumbar spine can be obtained to further evaluate   HPI  ROS  Past Medical History  Diagnosis Date  . Recurrent sinus infections   . Arthritis   . PONV (postoperative nausea and vomiting)   . GERD (gastroesophageal reflux disease)   . 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 Anxiety     no problems at present  . Breast cancer   . 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 Radiation 07/16/14-07/22/14    Right chest wall nodule  20 Gy in 5 fractions  . Brain cancer     breast ca with brain mets  . 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 Tubal ligation  1985  . Portacath placement N/A 01/10/2013    Procedure: ULTRASOUND GUIDED PORT-A-CATH INSERTION WITH FLUOROSCOPY;  Surgeon: [name], MD;  Location: [facility];  Service: General;  Laterality: N/A;  . Esophagogastroduodenoscopy N/A 08/19/2013    Procedure: ESOPHAGOGASTRODUODENOSCOPY (EGD);  Surgeon: Garlan Fair, MD;  Location: [facility];  Service: Endoscopy;  Laterality: N/A;  . Total mastectomy Right 02/17/2014    Procedure: PALLIATIVE  RIGHT MASTECTOMY;  Surgeon: [name], MD;  Location: [facility];  Service: General;  Laterality: Right;    has Breast cancer of right female breast s/p palliative mastectomy 02/17/14; GERD (gastroesophageal reflux disease); Epiphora; Rash, skin; Insomnia; Primary cancer of right breast with metastasis to other site; Physical deconditioning; Diarrhea; Lung metastases; Liver metastases; Constipation; Breast cancer metastasized to brain; FTT (failure to thrive) in adult; Weakness; Protein-calorie malnutrition, severe; Thrush; Skin breakdown; Elevated liver enzymes; Back spasm; DVT (deep venous thrombosis); Difficulty swallowing; Generalized weakness; C. difficile colitis; Clostridium difficile colitis; Lumbar pain; Hyperphosphatemia; Transaminitis; Hypoalbuminemia; and Long term current use of anticoagulant therapy on her problem list.    is allergic to codeine and penicillins.    Medication List       This list is accurate as of: 12/23/14  5:56 PM.  Always use your most recent med list.  acyclovir 400 MG tablet  Commonly known as:  ZOVIRAX  Take 1 tablet (400 mg total) by mouth 2 (two) times daily.     acyclovir 400 MG tablet  Commonly known as:  ZOVIRAX  TAKE 1 TABLET (400 MG TOTAL) BY MOUTH 5 (FIVE) TIMES DAILY.     cetirizine 10 MG tablet  Commonly  known as:  ZYRTEC  Take 10 mg by mouth daily.     cholecalciferol 1000 UNITS tablet  Commonly known as:  VITAMIN D  Take 1,000 Units by mouth daily.     [employer] GAS RELIEF 80 MG chewable tablet  Generic drug:  simethicone  CHEW 1 TABLET BY MOUTH EVERY 6 (SIX) HOURS AS NEEDED FOR FLATULENCE.     dexamethasone 2 MG tablet  Commonly known as:  DECADRON  Take 2 mg by mouth daily.     enoxaparin 100 MG/ML injection  Commonly known as:  LOVENOX  INJECT 0.95MLS (95MG TOTAL) INTO THE SKIN DAILY     fluconazole 100 MG tablet  Commonly known as:  DIFLUCAN  Take 100 mg by mouth daily.     gabapentin 100 MG capsule  Commonly known as:  NEURONTIN  TAKE 1 CAPSULE BY MOUTH 3 (THREE) TIMES DAILY.     HYDROmorphone 2 MG tablet  Commonly known as:  DILAUDID  Take 3 tablets (6 mg total) by mouth every 4 (four) hours as needed for severe pain.     levETIRAcetam 1000 MG tablet  Commonly known as:  KEPPRA  TAKE 1 TABLET (1,000 MG TOTAL) BY MOUTH EVERY 12 (TWELVE) HOURS.     lidocaine-prilocaine cream  Commonly known as:  EMLA  Apply 1 application topically daily as needed (prior to port being access). Apply to affected area once     methylphenidate 5 MG tablet  Commonly known as:  RITALIN  Take 1 tablet (5 mg total) by mouth 2 (two) times daily with breakfast and lunch.     metroNIDAZOLE 500 MG tablet  Commonly known as:  FLAGYL  Take 1 tablet (500 mg total) by mouth every 8 (eight) hours.     mirtazapine 30 MG tablet  Commonly known as:  REMERON  Take 0.5 tablets (15 mg total) by mouth at bedtime.     multivitamin with minerals Tabs tablet  Take 1 tablet by mouth daily.     omeprazole 20 MG capsule  Commonly known as:  PRILOSEC  TAKE 1 CAPSULE (20 MG TOTAL) BY MOUTH 2 (TWO) TIMES DAILY BEFORE A MEAL.     ORAL GEL ANESTHETIC MT  Use as directed 1 application in the mouth or throat 4 (four) times daily as needed (mouth pain).     prochlorperazine 10 MG tablet  Commonly known as:   COMPAZINE  TAKE 1 TABLET (10 MG TOTAL) BY MOUTH EVERY 6 (SIX) HOURS AS NEEDED (NAUSEA OR VOMITING).     RA KRILL OIL PO  Take 1 capsule by mouth daily.     SORE THROAT SPRAY MT  Use as directed 4 sprays in the mouth or throat 4 (four) times daily as needed (mouth pain).     traZODone 50 MG tablet  Commonly known as:  DESYREL  TAKE 2 TABLETS BY MOUTH AT BEDTIME FOR SLEEP     vancomycin 125 MG capsule  Commonly known as:  VANCOCIN  Take 1 capsule (125 mg total) by mouth 4 (four) times daily.         PHYSICAL EXAMINATION  Oncology Vitals 12/23/2014 11/25/2014 11/14/2014  11/13/2014 11/13/2014 11/13/2014 11/12/2014  Height - 160 cm - - - - -  Weight - 64.547 kg - - - - -  Weight (lbs) - 142 lbs 5 oz - - - - -  BMI (kg/m2) - 25.21 kg/m2 - - - - -  Temp 99.5 99.1 98.2 98.7 98.9 98.6 98.5  Pulse 117 118 - 75 88 86 72  Resp _0 SpO2 98 98 97 98 98 100 97  BSA (m2) - 1.69 m2 - - - - -   BP Readings from Last 3 Encounters:  12/23/14 115/73  11/25/14 124/86  11/14/14 115/75    Physical Exam  Constitutional: She is oriented to person, place, and time. Vital signs are normal. She appears malnourished and dehydrated. She appears unhealthy. She appears cachectic.  Patient appears fatigued, weak, frail, and chronically ill.  HENT:  Head: Normocephalic and atraumatic.  Mouth/Throat: Oropharynx is clear and moist.  Eyes: Conjunctivae and EOM are normal. Pupils are equal, round, and reactive to light. Right eye exhibits no discharge. Left eye exhibits no discharge. No scleral icterus.  Neck: Normal range of motion.  Pulmonary/Chest: Effort normal. No respiratory distress.  Musculoskeletal: Normal range of motion.  Neurological: She is alert and oriented to person, place, and time. Gait normal.  Skin: Skin is warm and dry. There is pallor.  Psychiatric: Affect normal.  Nursing note and vitals reviewed.   LABORATORY DATA:. Appointment on 12/23/2014  Component Date Value Ref  Range Status  . WBC 12/23/2014 9.4  3.9 - 10.3 10e3/uL Final  . NEUT# 12/23/2014 6.6* 1.5 - 6.5 10e3/uL Final  . HGB 12/23/2014 13.3  11.6 - 15.9 g/dL Final  . HCT 12/23/2014 40.3  34.8 - 46.6 % Final  . Platelets 12/23/2014 234  145 - 400 10e3/uL Final  . MCV 12/23/2014 102.0* 79.5 - 101.0 fL Final  . MCH 12/23/2014 33.7  25.1 - 34.0 pg Final  . MCHC 12/23/2014 33.0  31.5 - 36.0 g/dL Final  . RBC 12/23/2014 3.95  3.70 - 5.45 10e6/uL Final  . RDW 12/23/2014 18.1* 11.2 - 14.5 % Final  . lymph# 12/23/2014 1.4  0.9 - 3.3 10e3/uL Final  . MONO# 12/23/2014 1.4* 0.1 - 0.9 10e3/uL Final  . Eosinophils Absolute 12/23/2014 0.0  0.0 - 0.5 10e3/uL Final  . Basophils Absolute 12/23/2014 0.1  0.0 - 0.1 10e3/uL Final  . NEUT% 12/23/2014 70.3  38.4 - 76.8 % Final  . LYMPH% 12/23/2014 14.5  14.0 - 49.7 % Final  . MONO% 12/23/2014 14.4* 0.0 - 14.0 % Final  . EOS% 12/23/2014 0.1  0.0 - 7.0 % Final  . BASO% 12/23/2014 0.7  0.0 - 2.0 % Final  . Sodium 12/23/2014 140  136 - 145 mEq/L Final  . Potassium 12/23/2014 3.9  3.5 - 5.1 mEq/L Final  . Chloride 12/23/2014 105  98 - 109 mEq/L Final  . CO2 12/23/2014 25  22 - 29 mEq/L Final  . Glucose 12/23/2014 109  70 - 140 mg/dl Final  . BUN 12/23/2014 13.3  7.0 - 26.0 mg/dL Final  . Creatinine 12/23/2014 0.7  0.6 - 1.1 mg/dL Final  . Total Bilirubin 12/23/2014 0.67  0.20 - 1.20 mg/dL Final  . Alkaline Phosphatase 12/23/2014 838* 40 - 150 U/L Final  . AST 12/23/2014 120* 5 - 34 U/L Final  . ALT 12/23/2014 140* 0 - 55 U/L Final  . Total Protein 12/23/2014 6.7  6.4 - 8.3 g/dL Final  .  Albumin 12/23/2014 2.7* 3.5 - 5.0 g/dL Final  . Calcium 12/23/2014 9.7  8.4 - 10.4 mg/dL Final  . Anion Gap 12/23/2014 10  3 - 11 mEq/L Final  . EGFR 12/23/2014 >90  >90 ml/min/1.73 m2 Final   eGFR is calculated using the CKD-EPI Creatinine Equation (2009)  . Technologist Review 12/23/2014 Metas and Myelocytes present, rare Nrbc   Final     RADIOGRAPHIC STUDIES: No results  found.  ASSESSMENT/PLAN:    Breast cancer of right female breast s/p palliative mastectomy 02/17/14 Patient received cycle 6 of her Doxil chemotherapy on 11/25/2014.  Patient presented to the [facility] today to receive her next cycle of Doxil chemotherapy.  Patient reports increased generalized abdominal discomfort and cramping.  She has also had a temperature to the maximum of 99.5 recently.  She denies any significant diarrhea.  She is also complaining of some nausea; but no vomiting.  Patient has a history of recent C. difficile.  She just completed vancomycin oral antibiotics for tx of c diff.   Blood counts remained stable this week.  However, alkaline phosphatase has increased from 551 up to 838.  Also, both AST and ALT remain elevated.  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  Patient has plans to return on 01/12/2015 for brain MRI.  Patient is scheduled to return for labs, visit, and her next cycle of chemotherapy on 01/20/2015.  Will also order a lumbar MRI for further evaluation of continued complaint of lumbar back pain.  Clostridium difficile colitis Patient reports increased generalized abdominal discomfort and cramping.  She has also had a temperature to the maximum of 99.5 recently.  She denies any significant diarrhea.  She is also complaining of some nausea; but no vomiting.  Patient has a history of recent C. difficile.  Patient states that she took Flagyl antibiotics for treatment of her C. difficile; and it was ineffective.  She just completed vancomycin oral antibiotics for tx of c diff.   Have ordered a repeat stool for testing of C. difficile.  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     Hyperphosphatemia Alkaline phosphatase has increased from 551 and 838.  Will continue to monitor  closely.  Hypoalbuminemia Albumen Has decreased from 2.8 down to 2.7.  Patient was encouraged to push protein in her diet is much as possible.  Long term current use of anticoagulant therapy Patient has a history of a DVT in the past; and continues to take Lovenox injections on a daily basis.  Lumbar pain Patient has been complaining of some chronic lumbar back pain for the past 4-5 weeks.  She denies any known injury or trauma to the area.  Patient denies any numbness or tingling to her lower extremities.  She also denies any new onset weakness.  She denies any urinary or bowel incontinence.  Patient underwent a whole-body bone scan on 12/04/2014 for further evaluation of her lumbar pain.  Bone scan revealed:  Increased activity noted over the L1 region. This is suspicious for active process such as compression fracture or metastatic disease. No abnormality noted in this region on prior CT of 11/10/2014. Gadolinium-enhanced MRI of the lumbar spine can be obtained to further evaluate  Reviewed these findings with both patient and her husband today.  The plan is to obtain a lumbar MRI as soon as possible for further evaluation and management.  Confirmed that  patient does have a medication at home to take on an as-needed basis.   Transaminitis AST elevated to 120 and ALT elevated 140.  Will continue to monitor closely.  Patient stated understanding of all instructions; and was in agreement with this plan of care. The patient knows to call the clinic with any problems, questions or concerns.   Review/collaboration with Dr.  Alen Blew (on call  MD) regarding all aspects of patient's visit today.   Total time spent with patient was  40 minutes;  with greater than  50 percent of that time spent in face to face counseling regarding patient's symptoms,  and coordination of care and follow up.  Disclaimer: This note was dictated with voice recognition software. Similar sounding words can  inadvertently be transcribed and may not be corrected upon review.   Drue Second, NP 12/23/2014

## 2014-12-23 NOTE — Progress Notes (Signed)
Pt inquired about scan results done 12/04/14. Informed pt Dr.Magrinat was out of the office this week. In-basket sent to [facility], RN about pt inquire. Pt also requested refill on Trazadone, Ritalin and Dilaudid. Called and informed Natro, LPN with [name], NP. Pt denies any other questions or concerns at this time.

## 2014-12-24 ENCOUNTER — Other Ambulatory Visit: Payer: Self-pay | Admitting: Oncology

## 2014-12-25 ENCOUNTER — Telehealth: Payer: Self-pay | Admitting: *Deleted

## 2014-12-25 ENCOUNTER — Other Ambulatory Visit (HOSPITAL_COMMUNITY)
Admission: RE | Admit: 2014-12-25 | Discharge: 2014-12-25 | Disposition: A | Discharge status: Home / Self Care | Payer: BC Managed Care – PPO | Source: Ambulatory Visit | Attending: Oncology | Admitting: Oncology

## 2014-12-25 NOTE — Telephone Encounter (Signed)
TC from pt's husband. He states that his wife is experiencing an increase in pain-specifically to the right lower rib-the last rib. She states it is painful to take a deep breath but she is not SOB. Denies fever. She is taking her dilaudid 2mg  tabs-2 tabs every 4 hours and finds that it takes bit longer to relief the pain. Pt. was her 2 days ago to see Selena Lesser, NP for abdominal pain and low grade fever. Discussed situation with Selena Lesser, NP. She is agreeable to see and evaluate pt today.  Return call to husband and offered visit in [facility] today. He spoke with his wife and she would prefer to stay home and manage pain with current meds. She is scheduled for lumbar spine MRI next week. Advised husband that if pt symptoms escalate and pain is not controlled with current meds or other symptoms develop to take pt to [facility] ED. He verbalized understanding. Both he and his wife acknowledge symptoms to be aware of: increase , uncontrolled pain, nausea, vomiting, decreased fluid intake, jaundice, shortness of breath.   Selena Lesser, NP made aware of pt's decision to stay at home at this time.

## 2014-12-27 ENCOUNTER — Inpatient Hospital Stay (HOSPITAL_COMMUNITY)
Admission: EM | Admit: 2014-12-27 | Discharge: 2015-01-01 | DRG: 872 | Disposition: A | Discharge status: Home Health | Payer: BC Managed Care – PPO | Attending: Family Medicine | Admitting: Family Medicine

## 2014-12-27 ENCOUNTER — Emergency Department (HOSPITAL_COMMUNITY): Payer: BC Managed Care – PPO

## 2014-12-27 ENCOUNTER — Inpatient Hospital Stay (HOSPITAL_COMMUNITY): Payer: BC Managed Care – PPO

## 2014-12-27 ENCOUNTER — Encounter (HOSPITAL_COMMUNITY): Payer: Self-pay | Admitting: Emergency Medicine

## 2014-12-27 DIAGNOSIS — R1013 Epigastric pain: Secondary | ICD-10-CM

## 2014-12-27 DIAGNOSIS — M1990 Unspecified osteoarthritis, unspecified site: Secondary | ICD-10-CM | POA: Diagnosis present

## 2014-12-27 DIAGNOSIS — C7800 Secondary malignant neoplasm of unspecified lung: Secondary | ICD-10-CM | POA: Diagnosis present

## 2014-12-27 DIAGNOSIS — C787 Secondary malignant neoplasm of liver and intrahepatic bile duct: Secondary | ICD-10-CM | POA: Diagnosis present

## 2014-12-27 DIAGNOSIS — C7931 Secondary malignant neoplasm of brain: Secondary | ICD-10-CM | POA: Diagnosis present

## 2014-12-27 DIAGNOSIS — A047 Enterocolitis due to Clostridium difficile: Secondary | ICD-10-CM | POA: Diagnosis present

## 2014-12-27 DIAGNOSIS — R1010 Upper abdominal pain, unspecified: Secondary | ICD-10-CM | POA: Diagnosis not present

## 2014-12-27 DIAGNOSIS — I5022 Chronic systolic (congestive) heart failure: Secondary | ICD-10-CM | POA: Diagnosis present

## 2014-12-27 DIAGNOSIS — K5900 Constipation, unspecified: Secondary | ICD-10-CM | POA: Diagnosis not present

## 2014-12-27 DIAGNOSIS — E876 Hypokalemia: Secondary | ICD-10-CM | POA: Diagnosis present

## 2014-12-27 DIAGNOSIS — A4151 Sepsis due to Escherichia coli [E. coli]: Principal | ICD-10-CM | POA: Diagnosis present

## 2014-12-27 DIAGNOSIS — R1011 Right upper quadrant pain: Secondary | ICD-10-CM

## 2014-12-27 DIAGNOSIS — N12 Tubulo-interstitial nephritis, not specified as acute or chronic: Secondary | ICD-10-CM | POA: Diagnosis present

## 2014-12-27 DIAGNOSIS — Z8052 Family history of malignant neoplasm of bladder: Secondary | ICD-10-CM

## 2014-12-27 DIAGNOSIS — Z7952 Long term (current) use of systemic steroids: Secondary | ICD-10-CM

## 2014-12-27 DIAGNOSIS — M4856XA Collapsed vertebra, not elsewhere classified, lumbar region, initial encounter for fracture: Secondary | ICD-10-CM | POA: Diagnosis not present

## 2014-12-27 DIAGNOSIS — C7951 Secondary malignant neoplasm of bone: Secondary | ICD-10-CM | POA: Diagnosis present

## 2014-12-27 DIAGNOSIS — N390 Urinary tract infection, site not specified: Secondary | ICD-10-CM | POA: Diagnosis not present

## 2014-12-27 DIAGNOSIS — C50411 Malignant neoplasm of upper-outer quadrant of right female breast: Secondary | ICD-10-CM | POA: Diagnosis present

## 2014-12-27 DIAGNOSIS — A0472 Enterocolitis due to Clostridium difficile, not specified as recurrent: Secondary | ICD-10-CM | POA: Diagnosis present

## 2014-12-27 DIAGNOSIS — C50919 Malignant neoplasm of unspecified site of unspecified female breast: Secondary | ICD-10-CM | POA: Diagnosis present

## 2014-12-27 DIAGNOSIS — B9689 Other specified bacterial agents as the cause of diseases classified elsewhere: Secondary | ICD-10-CM | POA: Diagnosis not present

## 2014-12-27 DIAGNOSIS — Z885 Allergy status to narcotic agent status: Secondary | ICD-10-CM

## 2014-12-27 DIAGNOSIS — Z79899 Other long term (current) drug therapy: Secondary | ICD-10-CM

## 2014-12-27 DIAGNOSIS — F419 Anxiety disorder, unspecified: Secondary | ICD-10-CM | POA: Diagnosis present

## 2014-12-27 DIAGNOSIS — R748 Abnormal levels of other serum enzymes: Secondary | ICD-10-CM | POA: Diagnosis present

## 2014-12-27 DIAGNOSIS — R627 Adult failure to thrive: Secondary | ICD-10-CM | POA: Diagnosis present

## 2014-12-27 DIAGNOSIS — I82409 Acute embolism and thrombosis of unspecified deep veins of unspecified lower extremity: Secondary | ICD-10-CM | POA: Diagnosis present

## 2014-12-27 DIAGNOSIS — R339 Retention of urine, unspecified: Secondary | ICD-10-CM | POA: Diagnosis present

## 2014-12-27 DIAGNOSIS — Z7901 Long term (current) use of anticoagulants: Secondary | ICD-10-CM

## 2014-12-27 DIAGNOSIS — C50911 Malignant neoplasm of unspecified site of right female breast: Secondary | ICD-10-CM | POA: Diagnosis not present

## 2014-12-27 DIAGNOSIS — A419 Sepsis, unspecified organism: Secondary | ICD-10-CM | POA: Diagnosis present

## 2014-12-27 DIAGNOSIS — I82403 Acute embolism and thrombosis of unspecified deep veins of lower extremity, bilateral: Secondary | ICD-10-CM | POA: Diagnosis not present

## 2014-12-27 DIAGNOSIS — K219 Gastro-esophageal reflux disease without esophagitis: Secondary | ICD-10-CM | POA: Diagnosis present

## 2014-12-27 DIAGNOSIS — Z800 Family history of malignant neoplasm of digestive organs: Secondary | ICD-10-CM | POA: Diagnosis not present

## 2014-12-27 DIAGNOSIS — I959 Hypotension, unspecified: Secondary | ICD-10-CM | POA: Diagnosis present

## 2014-12-27 DIAGNOSIS — R160 Hepatomegaly, not elsewhere classified: Secondary | ICD-10-CM | POA: Diagnosis present

## 2014-12-27 DIAGNOSIS — Z6826 Body mass index (BMI) 26.0-26.9, adult: Secondary | ICD-10-CM

## 2014-12-27 DIAGNOSIS — M8458XA Pathological fracture in neoplastic disease, other specified site, initial encounter for fracture: Secondary | ICD-10-CM | POA: Diagnosis present

## 2014-12-27 DIAGNOSIS — Z86718 Personal history of other venous thrombosis and embolism: Secondary | ICD-10-CM | POA: Diagnosis not present

## 2014-12-27 DIAGNOSIS — R109 Unspecified abdominal pain: Secondary | ICD-10-CM | POA: Diagnosis not present

## 2014-12-27 DIAGNOSIS — Z79891 Long term (current) use of opiate analgesic: Secondary | ICD-10-CM

## 2014-12-27 DIAGNOSIS — Z880 Allergy status to penicillin: Secondary | ICD-10-CM

## 2014-12-27 DIAGNOSIS — E43 Unspecified severe protein-calorie malnutrition: Secondary | ICD-10-CM | POA: Diagnosis present

## 2014-12-27 LAB — URINE MICROSCOPIC-ADD ON

## 2014-12-27 LAB — COMPREHENSIVE METABOLIC PANEL
ALK PHOS: 622 U/L — AB (ref 38–126)
ALT: 100 U/L — ABNORMAL HIGH (ref 14–54)
ANION GAP: 8 (ref 5–15)
AST: 113 U/L — AB (ref 15–41)
Albumin: 2.5 g/dL — ABNORMAL LOW (ref 3.5–5.0)
BILIRUBIN TOTAL: 1 mg/dL (ref 0.3–1.2)
BUN: 10 mg/dL (ref 6–20)
CALCIUM: 8.6 mg/dL — AB (ref 8.9–10.3)
CHLORIDE: 101 mmol/L (ref 101–111)
CO2: 27 mmol/L (ref 22–32)
CREATININE: 0.53 mg/dL (ref 0.44–1.00)
GFR calc Af Amer: >60 mL/min (ref >60)
GFR calc non Af Amer: >60 mL/min (ref >60)
GLUCOSE: 102 mg/dL — AB (ref 65–99)
POTASSIUM: 4 mmol/L (ref 3.5–5.1)
Sodium: 136 mmol/L (ref 135–145)
Total Protein: 5.8 g/dL — ABNORMAL LOW (ref 6.5–8.1)

## 2014-12-27 LAB — LIPASE, BLOOD: Lipase: 43 U/L (ref 22–51)

## 2014-12-27 LAB — URINALYSIS, ROUTINE W REFLEX MICROSCOPIC
Bilirubin Urine: NEGATIVE
GLUCOSE, UA: NEGATIVE mg/dL
KETONES UR: NEGATIVE mg/dL
NITRITE: POSITIVE — AB
PH: 6 (ref 5.0–8.0)
Protein, ur: NEGATIVE mg/dL
SPECIFIC GRAVITY, URINE: 1.035 — AB (ref 1.005–1.030)
Urobilinogen, UA: 0.2 mg/dL (ref 0.0–1.0)

## 2014-12-27 LAB — CBC
HEMATOCRIT: 36 % (ref 36.0–46.0)
Hemoglobin: 11.5 g/dL — ABNORMAL LOW (ref 12.0–15.0)
MCH: 32.5 pg (ref 26.0–34.0)
MCHC: 31.9 g/dL (ref 30.0–36.0)
MCV: 101.7 fL — ABNORMAL HIGH (ref 78.0–100.0)
PLATELETS: 160 10*3/uL (ref 150–400)
RBC: 3.54 MIL/uL — ABNORMAL LOW (ref 3.87–5.11)
RDW: 18.3 % — ABNORMAL HIGH (ref 11.5–15.5)
WBC: 14 10*3/uL — AB (ref 4.0–10.5)

## 2014-12-27 LAB — I-STAT TROPONIN, ED: Troponin i, poc: 0.01 ng/mL (ref 0.00–0.08)

## 2014-12-27 LAB — MAGNESIUM: Magnesium: 1.8 mg/dL (ref 1.7–2.4)

## 2014-12-27 LAB — PROCALCITONIN: Procalcitonin: 0.42 ng/mL

## 2014-12-27 LAB — MRSA PCR SCREENING: MRSA BY PCR: NEGATIVE

## 2014-12-27 LAB — I-STAT CG4 LACTIC ACID, ED: Lactic Acid, Venous: 1.07 mmol/L (ref 0.5–2.0)

## 2014-12-27 MED ORDER — CHLORHEXIDINE GLUCONATE 0.12 % MT SOLN
15.0000 mL | Freq: Two times a day (BID) | OROMUCOSAL | Status: DC
  Administered 2014-12-27 – 2014-12-31 (×9): 15 mL via OROMUCOSAL
  Filled 2014-12-27 (×9): qty 15

## 2014-12-27 MED ORDER — MIRTAZAPINE 15 MG PO TABS
15.0000 mg | ORAL_TABLET | Freq: Every day | ORAL | Status: DC
  Administered 2014-12-27 – 2014-12-31 (×5): 15 mg via ORAL
  Filled 2014-12-27 (×5): qty 1

## 2014-12-27 MED ORDER — RISAQUAD PO CAPS
1.0000 | ORAL_CAPSULE | Freq: Every day | ORAL | Status: DC
  Administered 2014-12-28 – 2015-01-01 (×5): 1 via ORAL
  Filled 2014-12-27 (×7): qty 1

## 2014-12-27 MED ORDER — SODIUM CHLORIDE 0.9 % IV SOLN
Freq: Once | INTRAVENOUS | Status: AC
  Administered 2014-12-27: 13:00:00 via INTRAVENOUS

## 2014-12-27 MED ORDER — SODIUM CHLORIDE 0.9 % IV BOLUS (SEPSIS): 1000.0000 mL | Freq: Once | INTRAVENOUS | Status: DC

## 2014-12-27 MED ORDER — DEXAMETHASONE 2 MG PO TABS
2.0000 mg | ORAL_TABLET | Freq: Every day | ORAL | Status: DC
  Administered 2014-12-28 – 2015-01-01 (×5): 2 mg via ORAL
  Filled 2014-12-27 (×5): qty 1

## 2014-12-27 MED ORDER — ONDANSETRON HCL 4 MG/2ML IJ SOLN
4.0000 mg | Freq: Once | INTRAMUSCULAR | Status: AC
  Administered 2014-12-27: 4 mg via INTRAVENOUS
  Filled 2014-12-27: qty 2

## 2014-12-27 MED ORDER — ACYCLOVIR 400 MG PO TABS
400.0000 mg | ORAL_TABLET | Freq: Two times a day (BID) | ORAL | Status: DC
  Administered 2014-12-27 – 2015-01-01 (×10): 400 mg via ORAL
  Filled 2014-12-27 (×12): qty 1

## 2014-12-27 MED ORDER — SODIUM CHLORIDE 0.9 % IV SOLN
INTRAVENOUS | Status: DC
  Administered 2014-12-27: 19:00:00 via INTRAVENOUS
  Administered 2014-12-28: 125 mL via INTRAVENOUS
  Administered 2014-12-29 – 2014-12-31 (×4): via INTRAVENOUS

## 2014-12-27 MED ORDER — ADULT MULTIVITAMIN W/MINERALS CH
1.0000 | ORAL_TABLET | Freq: Every day | ORAL | Status: DC
  Administered 2014-12-28 – 2015-01-01 (×4): 1 via ORAL
  Filled 2014-12-27 (×5): qty 1

## 2014-12-27 MED ORDER — HYDROMORPHONE HCL 2 MG/ML IJ SOLN
4.0000 mg | Freq: Once | INTRAMUSCULAR | Status: AC
  Administered 2014-12-27: 4 mg via INTRAVENOUS
  Filled 2014-12-27: qty 2

## 2014-12-27 MED ORDER — RA KRILL OIL PO CAPS: 1.0000 | ORAL_CAPSULE | Freq: Every day | ORAL | Status: DC

## 2014-12-27 MED ORDER — LEVETIRACETAM 500 MG PO TABS
1000.0000 mg | ORAL_TABLET | Freq: Two times a day (BID) | ORAL | Status: DC
  Administered 2014-12-27 – 2015-01-01 (×10): 1000 mg via ORAL
  Filled 2014-12-27 (×10): qty 2

## 2014-12-27 MED ORDER — FLUCONAZOLE 100 MG PO TABS
100.0000 mg | ORAL_TABLET | Freq: Every day | ORAL | Status: DC
  Administered 2014-12-27 – 2015-01-01 (×6): 100 mg via ORAL
  Filled 2014-12-27 (×6): qty 1

## 2014-12-27 MED ORDER — FENTANYL CITRATE (PF) 100 MCG/2ML IJ SOLN
50.0000 ug | Freq: Once | INTRAMUSCULAR | Status: AC
  Administered 2014-12-27: 50 ug via INTRAVENOUS
  Filled 2014-12-27: qty 2

## 2014-12-27 MED ORDER — PHENOL 1.4 % MT LIQD: 1.0000 | Freq: Four times a day (QID) | OROMUCOSAL | Status: DC | PRN

## 2014-12-27 MED ORDER — SODIUM CHLORIDE 0.9 % IV BOLUS (SEPSIS)
1000.0000 mL | Freq: Once | INTRAVENOUS | Status: AC
  Administered 2014-12-27: 1000 mL via INTRAVENOUS

## 2014-12-27 MED ORDER — SODIUM CHLORIDE 0.9 % IJ SOLN
3.0000 mL | Freq: Two times a day (BID) | INTRAMUSCULAR | Status: DC
  Administered 2014-12-27 – 2014-12-29 (×5): 3 mL via INTRAVENOUS

## 2014-12-27 MED ORDER — ACETAMINOPHEN 325 MG PO TABS
650.0000 mg | ORAL_TABLET | Freq: Four times a day (QID) | ORAL | Status: DC | PRN
Start: 2014-12-27 — End: 2015-01-01
  Administered 2014-12-27: 650 mg via ORAL
  Filled 2014-12-27: qty 2

## 2014-12-27 MED ORDER — SODIUM CHLORIDE 0.9 % IV SOLN: 1500.0000 mg | Freq: Once | INTRAVENOUS | Status: DC

## 2014-12-27 MED ORDER — VANCOMYCIN HCL 10 G IV SOLR
1500.0000 mg | Freq: Once | INTRAVENOUS | Status: AC
  Administered 2014-12-27: 1500 mg via INTRAVENOUS
  Filled 2014-12-27: qty 1500

## 2014-12-27 MED ORDER — DEXTROSE 5 % IV SOLN
1.0000 g | Freq: Three times a day (TID) | INTRAVENOUS | Status: DC
  Administered 2014-12-27 – 2014-12-31 (×11): 1 g via INTRAVENOUS
  Filled 2014-12-27 (×12): qty 1

## 2014-12-27 MED ORDER — PANTOPRAZOLE SODIUM 40 MG IV SOLR
40.0000 mg | Freq: Two times a day (BID) | INTRAVENOUS | Status: DC
  Administered 2014-12-27 – 2014-12-29 (×4): 40 mg via INTRAVENOUS
  Filled 2014-12-27 (×4): qty 40

## 2014-12-27 MED ORDER — ENOXAPARIN SODIUM 100 MG/ML ~~LOC~~ SOLN
1.5000 mg/kg | Freq: Every day | SUBCUTANEOUS | Status: DC
  Administered 2014-12-27 – 2014-12-31 (×5): 100 mg via SUBCUTANEOUS
  Filled 2014-12-27 (×7): qty 1

## 2014-12-27 MED ORDER — VITAMIN D3 25 MCG (1000 UNIT) PO TABS
1000.0000 [IU] | ORAL_TABLET | Freq: Every day | ORAL | Status: DC
  Administered 2014-12-28 – 2015-01-01 (×4): 1000 [IU] via ORAL
  Filled 2014-12-27 (×5): qty 1

## 2014-12-27 MED ORDER — GABAPENTIN 100 MG PO CAPS
100.0000 mg | ORAL_CAPSULE | Freq: Three times a day (TID) | ORAL | Status: DC
  Administered 2014-12-27 – 2015-01-01 (×14): 100 mg via ORAL
  Filled 2014-12-27 (×16): qty 1

## 2014-12-27 MED ORDER — DEXTROSE 5 % IV SOLN
2.0000 g | INTRAVENOUS | Status: AC
  Administered 2014-12-27: 2 g via INTRAVENOUS
  Filled 2014-12-27: qty 2

## 2014-12-27 MED ORDER — HYDROMORPHONE HCL 2 MG/ML IJ SOLN
2.0000 mg | INTRAMUSCULAR | Status: DC | PRN
  Administered 2014-12-27 – 2014-12-30 (×10): 2 mg via INTRAVENOUS
  Filled 2014-12-27 (×12): qty 1

## 2014-12-27 MED ORDER — CETYLPYRIDINIUM CHLORIDE 0.05 % MT LIQD
7.0000 mL | Freq: Two times a day (BID) | OROMUCOSAL | Status: DC
  Administered 2014-12-28 – 2014-12-31 (×6): 7 mL via OROMUCOSAL

## 2014-12-27 MED ORDER — ONDANSETRON HCL 4 MG PO TABS: 4.0000 mg | ORAL_TABLET | Freq: Four times a day (QID) | ORAL | Status: DC | PRN

## 2014-12-27 MED ORDER — ONDANSETRON HCL 4 MG/2ML IJ SOLN
4.0000 mg | Freq: Four times a day (QID) | INTRAMUSCULAR | Status: DC | PRN
  Administered 2014-12-30: 4 mg via INTRAVENOUS
  Filled 2014-12-27: qty 2

## 2014-12-27 MED ORDER — VANCOMYCIN HCL IN DEXTROSE 750-5 MG/150ML-% IV SOLN
750.0000 mg | Freq: Two times a day (BID) | INTRAVENOUS | Status: DC
  Administered 2014-12-28 – 2014-12-30 (×5): 750 mg via INTRAVENOUS
  Filled 2014-12-27 (×7): qty 150

## 2014-12-27 MED ORDER — IOHEXOL 300 MG/ML  SOLN
100.0000 mL | Freq: Once | INTRAMUSCULAR | Status: AC | PRN
  Administered 2014-12-27: 100 mL via INTRAVENOUS

## 2014-12-27 MED ORDER — ACETAMINOPHEN 650 MG RE SUPP: 650.0000 mg | Freq: Four times a day (QID) | RECTAL | Status: DC | PRN

## 2014-12-27 MED ORDER — METHYLPHENIDATE HCL 5 MG PO TABS
5.0000 mg | ORAL_TABLET | Freq: Two times a day (BID) | ORAL | Status: DC
Start: 2014-12-28 — End: 2015-01-01
  Administered 2014-12-28 – 2015-01-01 (×8): 5 mg via ORAL
  Filled 2014-12-27 (×9): qty 1

## 2014-12-27 NOTE — Progress Notes (Signed)
ANTIBIOTIC CONSULT NOTE - INITIAL  Pharmacy Consult for Vancomycin / Ceftazidime Indication: Sepsis   Allergies  Allergen Reactions  . Codeine Nausea And Vomiting    "violently ill"  . Penicillins Rash    Childhood reaction -    Patient Measurements: Height: 5\' 4"  (162.6 cm) Weight: 145 lb (65.772 kg) IBW/kg (Calculated) : 54.7 Adjusted Body Weight:   Vital Signs: Temp: 100.8 F (38.2 C) (08/14 1253) Temp Source: Oral (08/14 1253) BP: 107/65 mmHg (08/14 1253) Pulse Rate: 121 (08/14 1253) Intake/Output from previous day:   Intake/Output from this shift:    Labs: No results for input(s): WBC, HGB, PLT, LABCREA, CREATININE in the last 72 hours. Estimated Creatinine Clearance: 68.9 mL/min (by C-G formula based on Cr of 0.7). No results for input(s): VANCOTROUGH, VANCOPEAK, VANCORANDOM, GENTTROUGH, GENTPEAK, GENTRANDOM, TOBRATROUGH, TOBRAPEAK, TOBRARND, AMIKACINPEAK, AMIKACINTROU, AMIKACIN in the last 72 hours.   Microbiology: Recent Results (from the past 720 hour(s))  Clostridium Difficile by PCR (not at [facility])     Status: Abnormal   Collection Time: 11/30/14  9:24 AM  Result Value Ref Range Status   Toxigenic C Difficile by pcr POSITIVE (A) NEGATIVE Final    Comment: CRITICAL RESULT CALLED TO, READ BACK BY AND VERIFIED WITH: J. JENKINS MT AT 1030 ON 07.18.16 BY SHUEA   TECHNOLOGIST REVIEW     Status: None   Collection Time: 12/23/14  9:40 AM  Result Value Ref Range Status   Technologist Review Metas and Myelocytes present, rare Nrbc  Final    Medical History: Past Medical History  Diagnosis Date  . Recurrent sinus infections   . Arthritis   . PONV (postoperative nausea and vomiting)   . GERD (gastroesophageal reflux disease)   . 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 Anxiety     no problems at present  . Breast cancer   . 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 Radiation 07/16/14-07/22/14    Right chest wall  nodule 20 Gy in 5 fractions  . Brain cancer     breast ca with brain mets  . 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  Chemotherapy due on 8/10 held for elevated LFTs and alk phos and concern for possible recurrent clostridium difficile infection.  Pharmacy consulted to start vancomycin and ceftazidime for sepsis. Noted allergy to penicillins (rash) but pt has tolerated cephalosporins in the past.   Anti-infectives 8/14 >> Vancomycin  >> 8/14 >> Ceftazidime >>    Vitals/Labs WBC: Elevated 14.0K Tm24h: 100.4 Lactic acid: 1.07 SCr: 0.53, CrCl 69 ml/min CG (N83 using rounded SCr 0.8)  Cultures 8/14 bloodx2: ordered 8/14 urine: ordered   Goal of Therapy:  Vancomycin trough level 15-20 mcg/ml  Eradication of infection  Plan:  Ceftazidime 2g x1, then 1g IV q8h Vancomycin 1500mg  IV x 1, then 750mg  IV q12h F/u renal fxn, cultures, clinical course, VT at [facility] as warranted  Ralene Bathe, PharmD, BCPS 12/27/2014, 2:27 PM  Pager: [phone]

## 2014-12-27 NOTE — ED Notes (Signed)
Dr. Thomasene Lot notified by this RN that we are unable to get another IV access site, or to get another set of blood cultures.  Ok given to begin antibiotics.  3rd liter of NS was verbally cancelled by Dr. Thomasene Lot as well.

## 2014-12-27 NOTE — ED Provider Notes (Signed)
CSN: [id number]     Arrival date & time 12/27/14  1147 History   First MD Initiated Contact with Patient 12/27/14 1257     Chief Complaint  Patient presents with  . Abdominal Pain     (Consider location/radiation/quality/duration/timing/severity/associated sxs/prior Treatment) HPI  Patient is a 64 year old female with past medical history significant for breast cancer currently on chemotherapy. She was unable to take chemotherapy on Thursday because of slight fever. She's been feeling worse and worse with increasing fever. She has abdominal pain foul-smelling urine and fever today.  Past Medical History  Diagnosis Date  . Recurrent sinus infections   . Arthritis   . PONV (postoperative nausea and vomiting)   . GERD (gastroesophageal reflux disease)   . 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 Anxiety     no problems at present  . Breast cancer   . 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 Radiation 07/16/14-07/22/14    Right chest wall nodule 20 Gy in 5 fractions  . Brain cancer     breast ca with brain mets  . 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 Tubal ligation  1985  . Portacath placement N/A 01/10/2013    Procedure: ULTRASOUND GUIDED PORT-A-CATH INSERTION WITH FLUOROSCOPY;  Surgeon: [name], MD;  Location: [facility];  Service: General;  Laterality: N/A;  . Esophagogastroduodenoscopy N/A 08/19/2013    Procedure: ESOPHAGOGASTRODUODENOSCOPY (EGD);  Surgeon: Garlan Fair, MD;  Location: [facility];  Service: Endoscopy;  Laterality: N/A;  . Total mastectomy Right 02/17/2014    Procedure: PALLIATIVE  RIGHT MASTECTOMY;  Surgeon: [name], MD;  Location: [place];  Service: General;  Laterality: Right;   Family History  Problem Relation Age of Onset  . Bladder Cancer Father   . Colon cancer Maternal Grandmother    Social History  Substance Use Topics  . Smoking status: Never  Smoker   . Smokeless tobacco: Never Used  . Alcohol Use: No   OB History    No data available     Review of Systems  Constitutional: Positive for fever and fatigue. Negative for activity change.  HENT: Negative for congestion and drooling.   Eyes: Negative for discharge.  Respiratory: Negative for cough and chest tightness.   Cardiovascular: Negative for chest pain.  Gastrointestinal: Positive for nausea, abdominal pain and abdominal distention.  Genitourinary: Negative for dysuria and difficulty urinating.  Musculoskeletal: Negative for joint swelling.  Skin: Negative for rash.  Allergic/Immunologic: Positive for immunocompromised state.  Neurological: Negative for seizures and speech difficulty.  Psychiatric/Behavioral: Negative for behavioral problems and agitation.      Allergies  Codeine and Penicillins  Home Medications   Prior to Admission medications   Medication Sig Start Date End Date Taking? Authorizing Provider  acyclovir (ZOVIRAX) 400 MG tablet Take 1 tablet (400 mg total) by mouth 2 (two) times daily. 07/22/14  Yes Chauncey Cruel, MD  Benzocaine (ORAL GEL ANESTHETIC MT) Use as directed 1 application in the mouth or throat 4 (four) times daily as needed (mouth pain).   Yes Historical Provider, MD  cetirizine (ZYRTEC) 10 MG tablet Take 10 mg by mouth daily as needed for allergies.    Yes Historical Provider, MD  cholecalciferol (VITAMIN D) 1000 UNITS tablet Take 1,000 Units by mouth daily.   Yes Historical Provider, MD  [employer] GAS RELIEF 80 MG chewable tablet CHEW 1 TABLET BY MOUTH EVERY 6 (  SIX) HOURS AS NEEDED FOR FLATULENCE. 09/04/14  Yes Chauncey Cruel, MD  dexamethasone (DECADRON) 2 MG tablet Take 2 mg by mouth daily.  08/02/14  Yes Historical Provider, MD  enoxaparin (LOVENOX) 100 MG/ML injection INJECT 0.95MLS (95MG  TOTAL) INTO THE SKIN DAILY 11/30/14  Yes Chauncey Cruel, MD  fluconazole (DIFLUCAN) 100 MG tablet Take 100 mg by mouth daily.   Yes Historical  Provider, MD  gabapentin (NEURONTIN) 100 MG capsule TAKE 1 CAPSULE BY MOUTH 3 (THREE) TIMES DAILY. 12/24/14  Yes Chauncey Cruel, MD  HYDROmorphone (DILAUDID) 2 MG tablet Take 3 tablets (6 mg total) by mouth every 4 (four) hours as needed for severe pain. 12/23/14  Yes Laurie Panda, NP  levETIRAcetam (KEPPRA) 1000 MG tablet TAKE 1 TABLET (1,000 MG TOTAL) BY MOUTH EVERY 12 (TWELVE) HOURS. 10/13/14  Yes Chauncey Cruel, MD  lidocaine-prilocaine (EMLA) cream Apply 1 application topically daily as needed (prior to port being access). Apply to affected area once 11/14/14  Yes Venetia Maxon Rama, MD  methylphenidate (RITALIN) 5 MG tablet Take 1 tablet (5 mg total) by mouth 2 (two) times daily with breakfast and lunch. 12/23/14  Yes Laurie Panda, NP  mirtazapine (REMERON) 30 MG tablet Take 0.5 tablets (15 mg total) by mouth at bedtime. 08/20/14  Yes Chauncey Cruel, MD  Multiple Vitamin (MULTIVITAMIN WITH MINERALS) TABS tablet Take 1 tablet by mouth daily.   Yes Historical Provider, MD  omeprazole (PRILOSEC) 20 MG capsule TAKE 1 CAPSULE (20 MG TOTAL) BY MOUTH 2 (TWO) TIMES DAILY BEFORE A MEAL. 07/21/14  Yes Chauncey Cruel, MD  Phenol (SORE THROAT SPRAY MT) Use as directed 4 sprays in the mouth or throat 4 (four) times daily as needed (mouth pain).   Yes Historical Provider, MD  Probiotic Product (PROBIOTIC PO) Take 1 capsule by mouth daily.   Yes Historical Provider, MD  prochlorperazine (COMPAZINE) 10 MG tablet TAKE 1 TABLET (10 MG TOTAL) BY MOUTH EVERY 6 (SIX) HOURS AS NEEDED (NAUSEA OR VOMITING). 06/09/14  Yes Chauncey Cruel, MD  RA KRILL OIL PO Take 1 capsule by mouth daily.   Yes Historical Provider, MD  traZODone (DESYREL) 50 MG tablet TAKE 2 TABLETS BY MOUTH AT BEDTIME FOR SLEEP 12/23/14  Yes Laurie Panda, NP  vancomycin (VANCOCIN) 125 MG capsule Take 1 capsule (125 mg total) by mouth 4 (four) times daily. Patient taking differently: Take 125 mg by mouth 4 (four) times daily. Started  12-23-14 for 10 days 01-02-15 12/23/14  Yes Susanne Borders, NP  acyclovir (ZOVIRAX) 400 MG tablet TAKE 1 TABLET (400 MG TOTAL) BY MOUTH 5 (FIVE) TIMES DAILY. Patient not taking: Reported on 12/27/2014 12/21/14   Chauncey Cruel, MD  metroNIDAZOLE (FLAGYL) 500 MG tablet Take 1 tablet (500 mg total) by mouth every 8 (eight) hours. Patient not taking: Reported on 12/27/2014 11/14/14   Venetia Maxon Rama, MD   BP 105/73 mmHg  Pulse 117  Temp(Src) 100.1 F (37.8 C) (Oral)  Resp 20  Ht 5\' 4"  (1.626 m)  Wt 145 lb (65.772 kg)  BMI 24.88 kg/m2  SpO2 97% Physical Exam  Constitutional: She is oriented to person, place, and time. She appears well-developed and well-nourished.  HENT:  Head: Normocephalic and atraumatic.  Eyes: Conjunctivae are normal. Right eye exhibits no discharge.  Neck: Neck supple.  Cardiovascular: Normal rate, regular rhythm and normal heart sounds.   No murmur heard. Pulmonary/Chest: Effort normal and breath sounds normal. She has no wheezes.  She has no rales.  Abdominal: Soft. She exhibits no distension. There is no tenderness.  Musculoskeletal: Normal range of motion. She exhibits no edema.  Neurological: She is oriented to person, place, and time. No cranial nerve deficit.  Skin: Skin is warm and dry. No rash noted. She is not diaphoretic.  Psychiatric: She has a normal mood and affect. Her behavior is normal.  Nursing note and vitals reviewed.   ED Course  Procedures (including critical care time) Labs Review Labs Reviewed  COMPREHENSIVE METABOLIC PANEL - Abnormal; Notable for the following:    Glucose, Bld 102 (*)    Calcium 8.6 (*)    Total Protein 5.8 (*)    Albumin 2.5 (*)    AST 113 (*)    ALT 100 (*)    Alkaline Phosphatase 622 (*)    All other components within normal limits  CBC - Abnormal; Notable for the following:    WBC 14.0 (*)    RBC 3.54 (*)    Hemoglobin 11.5 (*)    MCV 101.7 (*)    RDW 18.3 (*)    All other components within normal limits   URINE CULTURE  CULTURE, BLOOD (ROUTINE X 2)  CULTURE, BLOOD (ROUTINE X 2)  LIPASE, BLOOD  URINALYSIS, ROUTINE W REFLEX MICROSCOPIC (NOT AT [facility])  PROTIME-INR  I-STAT TROPOININ, ED  I-STAT CG4 LACTIC ACID, ED    Imaging Review Dg Chest 2 View  12/27/2014   CLINICAL DATA:  Shortness of breath.  EXAM: CHEST  2 VIEW  COMPARISON:  November 10, 2014.  FINDINGS: Stable cardiomegaly. Right internal jugular Port-A-Cath is unchanged in position with distal tip overlying expected position of the SVC. No pneumothorax or pleural effusion is noted. Two nodules are noted in the left upper lobe concerning for metastatic disease. Stable blunting of left costophrenic sulcus is noted consistent with small effusion or scarring. Small nodule is noted in right upper lobe.  IMPRESSION: Bilateral pulmonary nodules are noted concerning for metastatic disease. Stable blunting of left costophrenic sulcus consistent with scarring or small pleural effusion.   Electronically Signed   By: Marijo Conception, M.D.   On: 12/27/2014 14:25   I, [place], personally reviewed and evaluated these images and lab results as part of my medical decision-making.   EKG Interpretation   Date/Time:  Sunday December 27 2014 12:58:04 EDT Ventricular Rate:  123 PR Interval:  104 QRS Duration: 75 QT Interval:  433 QTC Calculation: 619 R Axis:   49 Text Interpretation:  Age not entered, assumed to be  64 years old for  purpose of ECG interpretation Sinus tachycardia Probable left atrial  enlargement Borderline low voltage, extremity leads Consider RVH or PMI w/  secondary repol abnrm Prolonged QT interval Sinus node reentrant  tachycardia no acute ischemia Confirmed by Gerald Leitz ([id number]) on  12/27/2014 2:31:29 PM      MDM   Final diagnoses:  None   patient is a 64 year old female on chemotherapy for metastatic breast cancer presenting today with fever. Patient has been febrile for the last 3 days. It is getting worse.  She's been unable to tolerate anything by mouth. She has severe abdominal pain. We will initiate could sepsis given her tachycardia and fever. We'll get blood cultures will treat for neutropenic fever including ceftaz and vancomycin. We will get CT of her abdomen given the amount of pain she is having on palpation.  Shann Lewellyn Julio Alm, MD 12/27/14 1432

## 2014-12-27 NOTE — Progress Notes (Signed)
PHARMACIST - PHYSICIAN ORDER COMMUNICATION  CONCERNING: P&T Medication Policy on Herbal Medications  DESCRIPTION:  This patient's order for:  Krill oil caps  has been noted.  This product(s) is classified as an "herbal" or natural product. Due to a lack of definitive safety studies or FDA approval, nonstandard manufacturing practices, plus the potential risk of unknown drug-drug interactions while on inpatient medications, the Pharmacy and Therapeutics Committee does not permit the use of "herbal" or natural products of this type within [facility].   ACTION TAKEN: The pharmacy department is unable to verify this order at this time and your patient has been informed of this safety policy. Please reevaluate patient's clinical condition at discharge and address if the herbal or natural product(s) should be resumed at that time.   Dorrene German 12/27/2014 6:49 PM

## 2014-12-27 NOTE — ED Notes (Signed)
Both bags of NS began infusing at 1250 per V.O. From Dr. Thomasene Lot

## 2014-12-27 NOTE — ED Notes (Signed)
RN Apolonio Schneiders B going to attempt to start another IV and get labs when patient returns from radiology

## 2014-12-27 NOTE — ED Notes (Signed)
US Abdomen clicked off at [id number] in error.

## 2014-12-27 NOTE — ED Notes (Signed)
I attempted x2 to opbtain blood- unsuccessful x2. RN Apolonio Schneiders B notified

## 2014-12-27 NOTE — ED Notes (Signed)
Patient transported to X-ray 

## 2014-12-27 NOTE — Progress Notes (Signed)
ANTICOAGULATION CONSULT NOTE - Initial Consult  Pharmacy Consult for Lovenox Indication: VTE treatment  Allergies  Allergen Reactions  . Codeine Nausea And Vomiting    "violently ill"  . Penicillins Rash    Childhood reaction -    Patient Measurements: Height: 5\' 4"  (162.6 cm) Weight: 145 lb (65.772 kg) IBW/kg (Calculated) : 54.7 Heparin Dosing Weight:   Vital Signs: Temp: 100.1 F (37.8 C) (08/14 1453) Temp Source: Oral (08/14 1453) BP: 122/69 mmHg (08/14 1635) Pulse Rate: 122 (08/14 1635)  Labs:  Recent Labs  12/27/14 1330  HGB 11.5*  HCT 36.0  PLT 160  CREATININE 0.53    Estimated Creatinine Clearance: 68.9 mL/min (by C-G formula based on Cr of 0.53).   Medical History: Past Medical History  Diagnosis Date  . Recurrent sinus infections   . Arthritis   . PONV (postoperative nausea and vomiting)   . GERD (gastroesophageal reflux disease)   . 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 Anxiety     no problems at present  . Breast cancer   . 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 Radiation 07/16/14-07/22/14    Right chest wall nodule 20 Gy in 5 fractions  . Brain cancer     breast ca with brain mets  . 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  Pharmacy consulted to resume lovenox dosing inpatient.    Home dose lovenox = 95mg  (~1.5mg /kg/day), last dose 8/13 @ 1200.  No issues with renal fxn.  Hgb sl low, plts WNL.   Goal of Therapy:  Anti-Xa level 0.6-1 units/ml 4hrs after LMWH dose given Monitor platelets by anticoagulation protocol: Yes   Plan:  Resume lovenox 1.5mg /kg/day = 100mg  (sl higher than home dose due to increase in weight) F/u CBC, renal fxn, s/sxs bleedings.   Ralene Bathe,  PharmD, BCPS 12/27/2014, 6:11 PM  Pager: [phone]

## 2014-12-27 NOTE — ED Notes (Signed)
Bed: WA20 Expected date: 12/27/14 Expected time: 11:34 AM Means of arrival: Ambulance Comments: abd pain x 2 days

## 2014-12-27 NOTE — ED Notes (Signed)
Dr. Warner Mccreedy

## 2014-12-27 NOTE — H&P (Addendum)
[facility] History and Physical  Carrie Berry:[id number] DOB: [birth year] DOA: 12/27/2014  Referring physician: Dr. Sandi Mariscal PCP: [name], MD   Primary oncologist: Dr. [name]  Chief Complaint:  Epigastric pain with generalized weakness worsened since 2 days  HPI:  64 year old female with history of metastatic breast cancer to the brain, lung, liver, metastatic lumbar spine fracture currently on Doxil chemotherapy (held last chemotherapy on 8/10 due to low-grade fever post and, on prolonged treatment for suspected C. difficile (symptoms of abdominal discomfort with cramping for several weeks), GERD, lower extremity DVT on Lovenox, severe protein calorie malnutrition, anxiety, focal motor seizure presented to the ED with severe epigastric pain and generalized weakness. Patient reports that since November of last year she has been having off and on crampy abdominal pain. Since the past 10-12 weeks she has been off and on treated for C. difficile initially with a course of Flagyl followed by 2 week course of vancomycin with persistent abdominal pain symptoms. Her staples were then positive for C. difficile and then was again placed on vancomycin which was stopped. During her last visit 4 days ago she was again restarted on oral vancomycin as her abdominal pain was persistent. Patient reports diffuse abdominal pain which is sharp and crampy in nature primarily involving the epigastric area. Denies radiation of the pain. She takes Dilaudid at home which has not been effective to control the pain. For the last 2 days the pain has been really severe. -Patient denies any fevers or chills, headaches, blurred vision, difficulty swallowing, nausea or vomiting. She denies any dysuria but reports history of urgency and her urine to be cloudy. Patient denies headache, dizziness, fever, chills, nausea , vomiting, chest pain, palpitations, SOB, bowel symptoms. Reports poor appetite.   Course in  the ED Patient was febrile with temperature of 100.33F, tachycardic up to 124, normal blood pressure with O2 sat of 88% on room air. Blood work showed WBC of 14 K, hemoglobin of 11.5, platelets of 160, normal chemistry with negative lactate. LFTs showed transaminitis which is chronic (AST 113, ALT 100, alkaline phosphatase of 622, normal bilirubin, albumin of 2.5). Code sepsis was initiated and patient received 2 L IV normal saline bolus. Blood cultures ordered. A Foley catheter was placed in for suspected urinary retention and UA sent suggestive of UTI. CT scan of the abdomen and pelvis was done which showed progressive pulmonary and hepatic metastasis since last imaging 2 months back. Also showed urinary bladder thickening compatible with UTI. Also showed acute to subacute L2 compression fracture and nonocclusive bilateral pelvic and femoral vein thrombus. Patient given a dose of IV vancomycin and Fortaz empirically and hospitalists admission requested to step down monitoring.  Review of Systems:  Constitutional: Denies fever, chills, diaphoresis, appetite change and fatigue.  HEENT: Denies visual or hearing symptoms, ear pain, congestion, sore throat, mouth sores, trouble swallowing, neck pain or stiffness Respiratory: Denies SOB, DOE, cough, chest tightness,  and wheezing.   Cardiovascular: Denies chest pain, palpitations and leg swelling.  Gastrointestinal: Abdominal pain and distention, Denies nausea, vomiting,  diarrhea, constipation, blood in stool  Genitourinary: urgency, frequency, denies dysuria, hematuria, flank pain and difficulty urinating.  Endocrine: Denies: hot or cold intolerance, sweats, polyuria, polydipsia. Musculoskeletal: Denies myalgias, back pain+, denies joint pain or swelling Skin: Denies pallor, rash and wound.  Neurological: Generalized weakness, Denies dizziness, seizures, syncope, light-headedness, numbness and headaches.  Hematological: Denies adenopathy.   Psychiatric/Behavioral: Denies confusion, difficulty sleeping   Past Medical History  Diagnosis  Date  . Recurrent sinus infections   . Arthritis   . PONV (postoperative nausea and vomiting)   . GERD (gastroesophageal reflux disease)   . 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 Anxiety     no problems at present  . Breast cancer   . 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 Radiation 07/16/14-07/22/14    Right chest wall nodule 20 Gy in 5 fractions  . Brain cancer     breast ca with brain mets  . 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 Tubal ligation  1985  . Portacath placement N/A 01/10/2013    Procedure: ULTRASOUND GUIDED PORT-A-CATH INSERTION WITH FLUOROSCOPY;  Surgeon: [name], MD;  Location: [place];  Service: General;  Laterality: N/A;  . Esophagogastroduodenoscopy N/A 08/19/2013    Procedure: ESOPHAGOGASTRODUODENOSCOPY (EGD);  Surgeon: Garlan Fair, MD;  Location: [facility];  Service: Endoscopy;  Laterality: N/A;  . Total mastectomy Right 02/17/2014    Procedure: PALLIATIVE  RIGHT MASTECTOMY;  Surgeon: [name], MD;  Location: [place];  Service: General;  Laterality: Right;   Social History:  reports that she has never smoked. She has never used smokeless tobacco. She reports that she does not drink alcohol or use illicit drugs.  Allergies  Allergen Reactions  . Codeine Nausea And Vomiting    "violently ill"  . Penicillins Rash    Childhood reaction -    Family History  Problem Relation Age of Onset  . Bladder Cancer Father   . Colon cancer Maternal Grandmother     Prior to Admission medications   Medication Sig Start Date End Date Taking? Authorizing Provider  acyclovir (ZOVIRAX) 400 MG tablet Take 1 tablet (400 mg total) by mouth 2 (two) times daily. 07/22/14  Yes Chauncey Cruel, MD  Benzocaine (ORAL GEL ANESTHETIC MT) Use as directed 1 application in the  mouth or throat 4 (four) times daily as needed (mouth pain).   Yes Historical Provider, MD  cetirizine (ZYRTEC) 10 MG tablet Take 10 mg by mouth daily as needed for allergies.    Yes Historical Provider, MD  cholecalciferol (VITAMIN D) 1000 UNITS tablet Take 1,000 Units by mouth daily.   Yes Historical Provider, MD  [employer] GAS RELIEF 80 MG chewable tablet CHEW 1 TABLET BY MOUTH EVERY 6 (SIX) HOURS AS NEEDED FOR FLATULENCE. 09/04/14  Yes Chauncey Cruel, MD  dexamethasone (DECADRON) 2 MG tablet Take 2 mg by mouth daily.  08/02/14  Yes Historical Provider, MD  enoxaparin (LOVENOX) 100 MG/ML injection INJECT 0.95MLS (95MG  TOTAL) INTO THE SKIN DAILY 11/30/14  Yes Chauncey Cruel, MD  fluconazole (DIFLUCAN) 100 MG tablet Take 100 mg by mouth daily.   Yes Historical Provider, MD  gabapentin (NEURONTIN) 100 MG capsule TAKE 1 CAPSULE BY MOUTH 3 (THREE) TIMES DAILY. 12/24/14  Yes Chauncey Cruel, MD  HYDROmorphone (DILAUDID) 2 MG tablet Take 3 tablets (6 mg total) by mouth every 4 (four) hours as needed for severe pain. 12/23/14  Yes Laurie Panda, NP  levETIRAcetam (KEPPRA) 1000 MG tablet TAKE 1 TABLET (1,000 MG TOTAL) BY MOUTH EVERY 12 (TWELVE) HOURS. 10/13/14  Yes Chauncey Cruel, MD  lidocaine-prilocaine (EMLA) cream Apply 1 application topically daily as needed (prior to port being access). Apply to affected area once 11/14/14  Yes Venetia Maxon Rama, MD  methylphenidate (RITALIN) 5 MG tablet Take 1 tablet (5 mg total) by  mouth 2 (two) times daily with breakfast and lunch. 12/23/14  Yes Laurie Panda, NP  mirtazapine (REMERON) 30 MG tablet Take 0.5 tablets (15 mg total) by mouth at bedtime. 08/20/14  Yes Chauncey Cruel, MD  Multiple Vitamin (MULTIVITAMIN WITH MINERALS) TABS tablet Take 1 tablet by mouth daily.   Yes Historical Provider, MD  omeprazole (PRILOSEC) 20 MG capsule TAKE 1 CAPSULE (20 MG TOTAL) BY MOUTH 2 (TWO) TIMES DAILY BEFORE A MEAL. 07/21/14  Yes Chauncey Cruel, MD  Phenol (SORE THROAT  SPRAY MT) Use as directed 4 sprays in the mouth or throat 4 (four) times daily as needed (mouth pain).   Yes Historical Provider, MD  Probiotic Product (PROBIOTIC PO) Take 1 capsule by mouth daily.   Yes Historical Provider, MD  prochlorperazine (COMPAZINE) 10 MG tablet TAKE 1 TABLET (10 MG TOTAL) BY MOUTH EVERY 6 (SIX) HOURS AS NEEDED (NAUSEA OR VOMITING). 06/09/14  Yes Chauncey Cruel, MD  RA KRILL OIL PO Take 1 capsule by mouth daily.   Yes Historical Provider, MD  traZODone (DESYREL) 50 MG tablet TAKE 2 TABLETS BY MOUTH AT BEDTIME FOR SLEEP 12/23/14  Yes Laurie Panda, NP  vancomycin (VANCOCIN) 125 MG capsule Take 1 capsule (125 mg total) by mouth 4 (four) times daily. Patient taking differently: Take 125 mg by mouth 4 (four) times daily. Started 12-23-14 for 10 days 01-02-15 12/23/14  Yes Susanne Borders, NP  acyclovir (ZOVIRAX) 400 MG tablet TAKE 1 TABLET (400 MG TOTAL) BY MOUTH 5 (FIVE) TIMES DAILY. Patient not taking: Reported on 12/27/2014 12/21/14   Chauncey Cruel, MD  metroNIDAZOLE (FLAGYL) 500 MG tablet Take 1 tablet (500 mg total) by mouth every 8 (eight) hours. Patient not taking: Reported on 12/27/2014 11/14/14   Venetia Maxon Rama, MD     Physical Exam:  Filed Vitals:   12/27/14 1325 12/27/14 1357 12/27/14 1453 12/27/14 1635  BP:  105/73 101/59 122/69  Pulse:  117 124 122  Temp:  100.1 F (37.8 C) 100.1 F (37.8 C)   TempSrc:  Oral Oral   Resp:  20 20 20   Height: 5\' 4"  (1.626 m)     Weight: 65.772 kg (145 lb)     SpO2:  97% 98% 99%    Constitutional: Vital signs reviewed.  Elderly female lying in bed in distress with pain HEENT: no pallor, no icterus, dry oral mucosa, no cervical lymphadenopathy Cardiovascular: S1 and S2 tachycardic, no murmurs rub or gallop Chest: CTAB, no wheezes, rales, or rhonchi, Port-A-Cath site appears clean Abdominal: Soft. Mild distention, tender to palpation diffusely more pronounced over epigastric and right upper quadrant area, bowel sounds  present Ext: warm, no edema Neurological: Alert and oriented, nonfocal  Labs on Admission:  Basic Metabolic Panel:  Recent Labs Lab 12/23/14 0941 12/27/14 1330  NA 140 136  K 3.9 4.0  CL  --  101  CO2 25 27  GLUCOSE 109 102*  BUN 13.3 10  CREATININE 0.7 0.53  CALCIUM 9.7 8.6*   Liver Function Tests:  Recent Labs Lab 12/23/14 0941 12/27/14 1330  AST 120* 113*  ALT 140* 100*  ALKPHOS 838* 622*  BILITOT 0.67 1.0  PROT 6.7 5.8*  ALBUMIN 2.7* 2.5*    Recent Labs Lab 12/27/14 1330  LIPASE 43   No results for input(s): AMMONIA in the last 168 hours. CBC:  Recent Labs Lab 12/23/14 0940 12/27/14 1330  WBC 9.4 14.0*  NEUTROABS 6.6*  --   HGB 13.3 11.5*  HCT 40.3 36.0  MCV 102.0* 101.7*  PLT 234 160   Cardiac Enzymes: No results for input(s): CKTOTAL, CKMB, CKMBINDEX, TROPONINI in the last 168 hours. BNP: Invalid input(s): POCBNP CBG: No results for input(s): GLUCAP in the last 168 hours.  Radiological Exams on Admission: Dg Chest 2 View  12/27/2014   CLINICAL DATA:  Shortness of breath.  EXAM: CHEST  2 VIEW  COMPARISON:  November 10, 2014.  FINDINGS: Stable cardiomegaly. Right internal jugular Port-A-Cath is unchanged in position with distal tip overlying expected position of the SVC. No pneumothorax or pleural effusion is noted. Two nodules are noted in the left upper lobe concerning for metastatic disease. Stable blunting of left costophrenic sulcus is noted consistent with small effusion or scarring. Small nodule is noted in right upper lobe.  IMPRESSION: Bilateral pulmonary nodules are noted concerning for metastatic disease. Stable blunting of left costophrenic sulcus consistent with scarring or small pleural effusion.   Electronically Signed   By: Marijo Conception, M.D.   On: 12/27/2014 14:25   Ct Abdomen Pelvis W Contrast  12/27/2014   CLINICAL DATA:  64 year old female on chemotherapy for metastatic breast cancer. Fever, abdominal pain, foul smelling urine.  Initial encounter.  EXAM: CT ABDOMEN AND PELVIS WITH CONTRAST  TECHNIQUE: Multidetector CT imaging of the abdomen and pelvis was performed using the standard protocol following bolus administration of intravenous contrast.  CONTRAST:  141mL OMNIPAQUE IOHEXOL 300 MG/ML  SOLN  COMPARISON:  CT Abdomen and Pelvis 11/10/2014 and earlier  FINDINGS: Lower lobe lung metastases appear mildly progressed since June, now all ranging from 12-18 mm diameter (11-14 mm previously). Superimposed pulmonary atelectasis. No pericardial or pleural effusion.  There is a new mild superior endplate fracture of the L2 vertebral body. No overt metastatic disease identified in that vertebra. No destructive osseous lesion identified elsewhere.  Negative uterus and adnexa. No pelvic free fluid. Decompressed distal colon. There is mild bladder wall thickening and hyper enhancement compared to the prior study (series 2, image 82). No gas within the bladder. There is mild perivesical stranding.  Decompressed sigmoid colon. Negative left colon and distal transverse colon. Retained stool from the cecum to the proximal transverse colon. Negative terminal ileum. Appendix appears to remain normal. No dilated small bowel. Decompressed stomach and duodenum.  Numerous subcutaneous nodules in the ventral abdominal wall, likely related to subcutaneous injections. Progressed hypodense liver lesions, increased in size and number since June. Confluent right lobe involvement. There may be some associated right lobe biliary dilatation, uncertain. Trace perihepatic free fluid. The portal venous system remains patent.  Negative gallbladder, spleen, pancreas and adrenal glands. Renal enhancement and contrast excretion within normal limits. Small retroperitoneal lymph nodes are stable.  There is evidence of nonocclusive thrombus in both external iliac veins and both common femoral veins, appears more extensive on the left.  IMPRESSION: 1. Progressed pulmonary and  hepatic metastatic disease since June. 2. Nonocclusive bilateral pelvic and femoral vein thrombus (nonocclusive DVTs). 3. Mild urinary bladder wall thickening and stranding compatible with urinary tract infection. 4. Acute to subacute L2 compression fracture which could be related to osteoporosis or metastatic disease.   Electronically Signed   By: Genevie Ann M.D.   On: 12/27/2014 16:22    EKG: Independently reviewed, sinus tachycardia at 123, prolonged QTC of 619  Assessment/Plan  Principal Problem:   Sepsis Secondary to possibly secondary to UTI. Admit to stepdown. Received IV normal saline bolus in the ED. Will place her on maintenance normal saline  at 125 mL per hour. Continue empiric vancomycin and Fortaz. Follow blood culture and urine culture. Check calcitonin level. -Continue fluconazole. Patient also on oral vancomycin for C. Difficile. Stool for C. difficile was positive on 6/30. Does not have any diarrhea. Patient has been treated with antibiotics off and on for almost 10-12 weeks. I will hold off on antibiotics for C. difficile for now.  Active Problems:  metastatic right  breast cancer Has metastasis to the brain, lungs, liver and lumbar vertebrae. s/p palliative mastectomy 02/17/14 and Currently on Doxil chemotherapy. Last chemotherapy on 8/10 was held due to low-grade fever. I will add her oncologist as consult. Continue supportive care for now including adequate pain control, IV fluids, antiemetics and PPI.  Severe abdominal pain No signs of bowel obstruction or inflammation on CT scan. Appears to be related to metastasis to the liver and lung bases. Will place on IV PPI. We'll also place her on IV Dilaudid 2 mg every 2 hours. Supportive care with antiemetics. Lipase normal. I will check ultrasound of her abdomen to rule out possible acute cholecystitis.     GERD (gastroesophageal reflux disease) IV PPI     FTT (failure to thrive) in adult, severe protein calorie  malnutrition PT and nutrition consulted  Prolonged QTC Hold all QT prolonging agents. (Remeron, , trazodone, Compazine, cetirizine)  Check magnesium level. Monitor on telemetry. Repeat EKG in a.m.    DVT (deep venous thrombosis) On therapeutic Lovenox which I will continue.    Clostridium difficile colitis Patient has been treated for C. difficile off-and-on for the past 10-12 weeks. C. difficile last checked on 6/30 was positive. Was restarted on oral vancomycin a few days ago given persistent abdominal pain. She does not have any diarrhea symptoms. I will hold off on treating for C. difficile at this time.    Diet: Clear liquid  DVT prophylaxis: One therapeutic sq lovenox   Code Status: full code Family communication: discussed with patient and her husband at bedside Disposition Plan: Admit to stepdown  Louellen Molder [facility] Pager [phone]  Total time spent on admission :70 minutes  If 7PM-7AM, please contact night-coverage [email or web address] Password [facility] 12/27/2014, 5:18 PM

## 2014-12-27 NOTE — ED Notes (Signed)
Pt has mutiple red/purple bruises to Bilateral upper extremities.  Some are still tears and various states of healing.  Bandages in place and intact and dry.

## 2014-12-27 NOTE — ED Notes (Signed)
Both bags of IV fluid began at 1250 by V.O. From Dr. Gaylyn Cheers

## 2014-12-27 NOTE — ED Notes (Signed)
Pt placed on bedpan, however unable to void.

## 2014-12-27 NOTE — ED Notes (Signed)
R asided breast CA pt with extensive mets to brain, liver etc.  Last BM yesterday.  Urinating normally, but has been a bit dark.  Appetite normal.  Right sided pain tender upon palpation with some distention.  Sinus tach 115-120  Temp 100 temporal  CBG 111 95% RA  124/72BP

## 2014-12-28 DIAGNOSIS — C50411 Malignant neoplasm of upper-outer quadrant of right female breast: Secondary | ICD-10-CM

## 2014-12-28 DIAGNOSIS — M4856XA Collapsed vertebra, not elsewhere classified, lumbar region, initial encounter for fracture: Secondary | ICD-10-CM

## 2014-12-28 DIAGNOSIS — B9689 Other specified bacterial agents as the cause of diseases classified elsewhere: Secondary | ICD-10-CM

## 2014-12-28 DIAGNOSIS — R748 Abnormal levels of other serum enzymes: Secondary | ICD-10-CM

## 2014-12-28 DIAGNOSIS — A419 Sepsis, unspecified organism: Secondary | ICD-10-CM

## 2014-12-28 DIAGNOSIS — C773 Secondary and unspecified malignant neoplasm of axilla and upper limb lymph nodes: Secondary | ICD-10-CM

## 2014-12-28 DIAGNOSIS — C7931 Secondary malignant neoplasm of brain: Secondary | ICD-10-CM

## 2014-12-28 DIAGNOSIS — C7801 Secondary malignant neoplasm of right lung: Secondary | ICD-10-CM

## 2014-12-28 DIAGNOSIS — C787 Secondary malignant neoplasm of liver and intrahepatic bile duct: Secondary | ICD-10-CM

## 2014-12-28 DIAGNOSIS — R109 Unspecified abdominal pain: Secondary | ICD-10-CM

## 2014-12-28 DIAGNOSIS — C7802 Secondary malignant neoplasm of left lung: Secondary | ICD-10-CM

## 2014-12-28 LAB — COMPREHENSIVE METABOLIC PANEL
ALBUMIN: 2.1 g/dL — AB (ref 3.5–5.0)
ALT: 89 U/L — AB (ref 14–54)
AST: 103 U/L — AB (ref 15–41)
Alkaline Phosphatase: 503 U/L — ABNORMAL HIGH (ref 38–126)
Anion gap: 8 (ref 5–15)
BILIRUBIN TOTAL: 1.3 mg/dL — AB (ref 0.3–1.2)
BUN: 6 mg/dL (ref 6–20)
CO2: 21 mmol/L — ABNORMAL LOW (ref 22–32)
CREATININE: 0.41 mg/dL — AB (ref 0.44–1.00)
Calcium: 7.9 mg/dL — ABNORMAL LOW (ref 8.9–10.3)
Chloride: 111 mmol/L (ref 101–111)
GFR calc Af Amer: >60 mL/min (ref >60)
GLUCOSE: 76 mg/dL (ref 65–99)
POTASSIUM: 3.7 mmol/L (ref 3.5–5.1)
Sodium: 140 mmol/L (ref 135–145)
TOTAL PROTEIN: 5.1 g/dL — AB (ref 6.5–8.1)

## 2014-12-28 LAB — CBC WITH DIFFERENTIAL/PLATELET
BASOS ABS: 0 10*3/uL (ref 0.0–0.1)
BASOS PCT: 0 % (ref 0–1)
Eosinophils Absolute: 0 10*3/uL (ref 0.0–0.7)
Eosinophils Relative: 0 % (ref 0–5)
HEMATOCRIT: 34 % — AB (ref 36.0–46.0)
HEMOGLOBIN: 10.6 g/dL — AB (ref 12.0–15.0)
LYMPHS PCT: 3 % — AB (ref 12–46)
Lymphs Abs: 0.7 10*3/uL (ref 0.7–4.0)
MCH: 32.1 pg (ref 26.0–34.0)
MCHC: 31.2 g/dL (ref 30.0–36.0)
MCV: 103 fL — AB (ref 78.0–100.0)
Monocytes Absolute: 1.9 10*3/uL — ABNORMAL HIGH (ref 0.1–1.0)
Monocytes Relative: 9 % (ref 3–12)
NEUTROS ABS: 19.1 10*3/uL — AB (ref 1.7–7.7)
NEUTROS PCT: 88 % — AB (ref 43–77)
Platelets: 133 10*3/uL — ABNORMAL LOW (ref 150–400)
RBC: 3.3 MIL/uL — AB (ref 3.87–5.11)
RDW: 18.6 % — AB (ref 11.5–15.5)
WBC: 21.7 10*3/uL — AB (ref 4.0–10.5)

## 2014-12-28 MED ORDER — SODIUM CHLORIDE 0.9 % IV BOLUS (SEPSIS)
1000.0000 mL | Freq: Once | INTRAVENOUS | Status: AC
  Administered 2014-12-28: 1000 mL via INTRAVENOUS

## 2014-12-28 MED ORDER — VANCOMYCIN 50 MG/ML ORAL SOLUTION
125.0000 mg | Freq: Four times a day (QID) | ORAL | Status: DC
Start: 2014-12-28 — End: 2015-01-01
  Administered 2014-12-28 – 2015-01-01 (×17): 125 mg via ORAL
  Filled 2014-12-28 (×24): qty 2.5

## 2014-12-28 NOTE — Progress Notes (Signed)
Initial Nutrition Assessment  DOCUMENTATION CODES:   Not applicable  INTERVENTION:  - Will monitor GOC and needs based on this (diet advancement versus nutrition support versus no nutritional route)  NUTRITION DIAGNOSIS:   Inadequate oral intake related to inability to eat as evidenced by NPO status.  GOAL:   Patient will meet greater than or equal to 90% of their needs  MONITOR:   Diet advancement, Weight trends, Labs, I & O's  REASON FOR ASSESSMENT:   Consult Assessment of nutrition requirement/status  ASSESSMENT:   64 year old female with history of metastatic breast cancer to the brain, lung, liver, metastatic lumbar spine fracture currently on Doxil chemotherapy (held last chemotherapy on 8/10 due to low-grade fever post and, on prolonged treatment for suspected C. difficile (symptoms of abdominal discomfort with cramping for several weeks), GERD, lower extremity DVT on Lovenox, severe protein calorie malnutrition, anxiety, focal motor seizure presented to the ED with severe epigastric pain and generalized weakness. Patient reports that since November of last year she has been having off and on crampy abdominal pain. Since the past 10-12 weeks she has been off and on treated for C. difficile initially with a course of Flagyl followed by 2 week course of vancomycin with persistent abdominal pain symptoms. Her staples were then positive for C. difficile and then was again placed on vancomycin which was stopped. During her last visit 4 days ago she was again restarted on oral vancomycin as her abdominal pain was persistent.  Pt seen for consult. BMI indicates overweight status. Pt has been NPO since admission.   Spoke with RN prior to talking to pt's husband. RN reports that pt has been taking Remeron for appetite stimulation. Pt took PO medications without difficulty swallowing this AM. At this time, no plan for nutrition support and it is felt that diet advancement is unlikely if  pt remains full code due to concern for aspiration; will continue to monitor [place] and potential route of nutrition.  Pt's husband provides information as pt is resting with eyes closed but does shake head in agreement throughout conversation. Pt has had a poor appetite for a few months but it has become worse over the past week and she would only eat a few bites at infrequent intervals. Remeron was prescribed for appetite stimulation as well as control of depression.   Pt has been experiencing taste alteration but liked coleslaw, [employer], and a few other items that still tasted good to her. Taste alterations fluctuated dependent on treatment cycles.   She was not having chewing or swallowing difficulty PTA and husband reports that pt may have coughed 1-2 times with intakes in the past week due to food "going down the wrong pipe."   Pt unable to meet needs currently. Did not perform physical assessment as pt and husband report that pt is in all-over pain. Medications reviewed. Labs reviewed; creatinine low, Ca: 7.9 mg/dL, LFTs elevated.    Diet Order:    NPO  Skin:  Reviewed, no issues  Last BM:  8/14  Height:   Ht Readings from Last 1 Encounters:  12/27/14 5\' 4"  (1.626 m)    Weight:   Wt Readings from Last 1 Encounters:  12/28/14 151 lb 10.8 oz (68.8 kg)    Ideal Body Weight:  54.54 kg (kg)  BMI:  Body mass index is 26.02 kg/(m^2).  Estimated Nutritional Needs:   Kcal:  2050-2250  Protein:  80-90 grams  Fluid:  2.2-2.5 L/day  EDUCATION NEEDS:  No education needs identified at this time     Jarome Matin, [place], [facility] Inpatient Clinical Dietitian Pager # [phone] After hours/weekend pager # [phone]

## 2014-12-28 NOTE — Progress Notes (Signed)
Order for a liter normal saline bolus received for SBP in 70s

## 2014-12-28 NOTE — Progress Notes (Signed)
Carrie Berry   DOB:Apr 26, 1951   DX#:[id number]   ZJQ#:[id number]  Subjective: Carrie Berry is alert and able to give me a good account of her recent history. She still has significant abdominal pain. She also has back pain, which does not radiate. There may have been falls at home though neither she or [name] were specific regarding that-- Ct shows a (new?) Fx at L2. No diarrhe despite Hx c diff (still positive when last checked 07/16). Wants something to eat. [name] and caregiver in room.   Objective: middle aged White woman examined in bed Filed Vitals:   12/28/14 2020  BP:   Pulse:   Temp: 98.3 F (36.8 C)  Resp:     Body mass index is 26.02 kg/(m^2).  Intake/Output Summary (Last 24 hours) at 12/28/14 2045 Last data filed at 12/28/14 1900  Gross per 24 hour  Intake 3965.5 ml  Output   1345 ml  Net 2620.5 ml    CBG (last 3)  No results for input(s): GLUCAP in the last 72 hours.   Labs:  Lab Results  Component Value Date   WBC 21.7* 12/28/2014   HGB 10.6* 12/28/2014   HCT 34.0* 12/28/2014   MCV 103.0* 12/28/2014   PLT 133* 12/28/2014   NEUTROABS 19.1* 12/28/2014    @LASTCHEMISTRY @  Urine Studies No results for input(s): UHGB, CRYS in the last 72 hours.  Invalid input(s): UACOL, UAPR, USPG, UPH, UTP, UGL, UKET, UBIL, UNIT, UROB, ULEU, UEPI, UWBC, URBC, UBAC, CAST, [place], [place]  Basic Metabolic Panel:  Recent Labs Lab 12/23/14 0941 12/27/14 1330 12/27/14 1708 12/28/14 0437  NA 140 136  --  140  K 3.9 4.0  --  3.7  CL  --  101  --  111  CO2 25 27  --  21*  GLUCOSE 109 102*  --  76  BUN 13.3 10  --  6  CREATININE 0.7 0.53  --  0.41*  CALCIUM 9.7 8.6*  --  7.9*  MG  --   --  1.8  --    GFR Estimated Creatinine Clearance: 70.3 mL/min (by C-G formula based on Cr of 0.41). Liver Function Tests:  Recent Labs Lab 12/23/14 0941 12/27/14 1330 12/28/14 0437  AST 120* 113* 103*  ALT 140* 100* 89*  ALKPHOS 838* 622* 503*  BILITOT 0.67 1.0 1.3*  PROT 6.7 5.8* 5.1*   ALBUMIN 2.7* 2.5* 2.1*    Recent Labs Lab 12/27/14 1330  LIPASE 43   No results for input(s): AMMONIA in the last 168 hours. Coagulation profile No results for input(s): INR, PROTIME in the last 168 hours.  CBC:  Recent Labs Lab 12/23/14 0940 12/27/14 1330 12/28/14 0437  WBC 9.4 14.0* 21.7*  NEUTROABS 6.6*  --  19.1*  HGB 13.3 11.5* 10.6*  HCT 40.3 36.0 34.0*  MCV 102.0* 101.7* 103.0*  PLT 234 160 133*   Cardiac Enzymes: No results for input(s): CKTOTAL, CKMB, CKMBINDEX, TROPONINI in the last 168 hours. BNP: Invalid input(s): POCBNP CBG: No results for input(s): GLUCAP in the last 168 hours. D-Dimer No results for input(s): DDIMER in the last 72 hours. Hgb A1c No results for input(s): HGBA1C in the last 72 hours. Lipid Profile No results for input(s): CHOL, HDL, LDLCALC, TRIG, CHOLHDL, LDLDIRECT in the last 72 hours. Thyroid function studies No results for input(s): TSH, T4TOTAL, T3FREE, THYROIDAB in the last 72 hours.  Invalid input(s): FREET3 Anemia work up No results for input(s): VITAMINB12, FOLATE, FERRITIN, TIBC, IRON, RETICCTPCT in the last  72 hours. Microbiology Recent Results (from the past 240 hour(s))  TECHNOLOGIST REVIEW     Status: None   Collection Time: 12/23/14  9:40 AM  Result Value Ref Range Status   Technologist Review Metas and Myelocytes present, rare Nrbc  Final  Blood Culture (routine x 2)     Status: None (Preliminary result)   Collection Time: 12/27/14  1:30 PM  Result Value Ref Range Status   Specimen Description PORTA CATH  Final   Special Requests BOTTLES DRAWN AEROBIC AND ANAEROBIC 6CC  Final   Culture   Final    NO GROWTH < 24 HOURS Performed at [facility]    Report Status PENDING  Incomplete  Urine culture     Status: None (Preliminary result)   Collection Time: 12/27/14  4:45 PM  Result Value Ref Range Status   Specimen Description URINE, CATHETERIZED  Final   Special Requests NONE  Final   Culture   Final     TOO YOUNG TO READ Performed at [facility]    Report Status PENDING  Incomplete  MRSA PCR Screening     Status: None   Collection Time: 12/27/14  6:40 PM  Result Value Ref Range Status   MRSA by PCR NEGATIVE NEGATIVE Final    Comment:        The GeneXpert MRSA Assay (FDA approved for NASAL specimens only), is one component of a comprehensive MRSA colonization surveillance program. It is not intended to diagnose MRSA infection nor to guide or monitor treatment for MRSA infections.       Studies:  Dg Chest 2 View  12/27/2014   CLINICAL DATA:  Shortness of breath.  EXAM: CHEST  2 VIEW  COMPARISON:  November 10, 2014.  FINDINGS: Stable cardiomegaly. Right internal jugular Port-A-Cath is unchanged in position with distal tip overlying expected position of the SVC. No pneumothorax or pleural effusion is noted. Two nodules are noted in the left upper lobe concerning for metastatic disease. Stable blunting of left costophrenic sulcus is noted consistent with small effusion or scarring. Small nodule is noted in right upper lobe.  IMPRESSION: Bilateral pulmonary nodules are noted concerning for metastatic disease. Stable blunting of left costophrenic sulcus consistent with scarring or small pleural effusion.   Electronically Signed   By: Marijo Conception, M.D.   On: 12/27/2014 14:25   Ct Abdomen Pelvis W Contrast  12/27/2014   CLINICAL DATA:  64 year old female on chemotherapy for metastatic breast cancer. Fever, abdominal pain, foul smelling urine. Initial encounter.  EXAM: CT ABDOMEN AND PELVIS WITH CONTRAST  TECHNIQUE: Multidetector CT imaging of the abdomen and pelvis was performed using the standard protocol following bolus administration of intravenous contrast.  CONTRAST:  116mL OMNIPAQUE IOHEXOL 300 MG/ML  SOLN  COMPARISON:  CT Abdomen and Pelvis 11/10/2014 and earlier  FINDINGS: Lower lobe lung metastases appear mildly progressed since June, now all ranging from 12-18 mm diameter (11-14  mm previously). Superimposed pulmonary atelectasis. No pericardial or pleural effusion.  There is a new mild superior endplate fracture of the L2 vertebral body. No overt metastatic disease identified in that vertebra. No destructive osseous lesion identified elsewhere.  Negative uterus and adnexa. No pelvic free fluid. Decompressed distal colon. There is mild bladder wall thickening and hyper enhancement compared to the prior study (series 2, image 82). No gas within the bladder. There is mild perivesical stranding.  Decompressed sigmoid colon. Negative left colon and distal transverse colon. Retained stool from the cecum to  the proximal transverse colon. Negative terminal ileum. Appendix appears to remain normal. No dilated small bowel. Decompressed stomach and duodenum.  Numerous subcutaneous nodules in the ventral abdominal wall, likely related to subcutaneous injections. Progressed hypodense liver lesions, increased in size and number since June. Confluent right lobe involvement. There may be some associated right lobe biliary dilatation, uncertain. Trace perihepatic free fluid. The portal venous system remains patent.  Negative gallbladder, spleen, pancreas and adrenal glands. Renal enhancement and contrast excretion within normal limits. Small retroperitoneal lymph nodes are stable.  There is evidence of nonocclusive thrombus in both external iliac veins and both common femoral veins, appears more extensive on the left.  IMPRESSION: 1. Progressed pulmonary and hepatic metastatic disease since June. 2. Nonocclusive bilateral pelvic and femoral vein thrombus (nonocclusive DVTs). 3. Mild urinary bladder wall thickening and stranding compatible with urinary tract infection. 4. Acute to subacute L2 compression fracture which could be related to osteoporosis or metastatic disease.   Electronically Signed   By: Genevie Ann M.D.   On: 12/27/2014 16:22   US Abdomen Limited Ruq  12/27/2014   CLINICAL DATA:  Abdominal  pain x2 days, elevated LFTs  EXAM: US ABDOMEN LIMITED - RIGHT UPPER QUADRANT  COMPARISON:  CT abdomen pelvis dated 12/27/2014.  FINDINGS: Gallbladder:  Contracted gallbladder. Secondary gallbladder wall thickening. No gallstones or pericholecystic fluid. Negative sonographic Murphy's sign.  Common bile duct:  Diameter: 2 mm.  Liver:  Heterogeneous appearance of the liver. Numerous ill-defined hypoechoic metastases are better visualized on recent CT abdomen pelvis.  IMPRESSION: Contracted gallbladder without associated sonographic findings to suggest acute cholecystitis.  Numerous ill-defined hypoechoic metastases, better evaluated on CT.   Electronically Signed   By: Julian Hy M.D.   On: 12/27/2014 18:02    Assessment: 64 y.o. [name], [place] woman status post right breast upper outer quadrant and right axillary lymph node biopsy 12/23/2012 for a clinical T3 N1 M1, stage IV invasive ductal carcinoma, grade 3, triple negative, with an MIB-1 of 86%.  (1) right liver lobe biopsy 01/21/2013 confirms metastatic adenocarcinoma; scans show involvement of the liver, lungs, and likely bone.  (2) enrolled in [facility] study [id number] (docetaxel + oral gamma secretase inibitor [id number]), received one cycle starting 02/04/2013 but withdrew because of poor tolerance despite treatment interruptions and decreased dosing of the oral component  (3) chest CT in early November 2014 was compared with studies in August 2014 and showed interval progression of metastatic breast cancer, with an enlarging primary right breast mass, enlarging right axillary lymph nodes, enlarging pulmonary nodules and new/enlarging hepatic metastases.  (4) Abraxane started 03/19/2013, given day 1 and day 8 of each 21 day cycle, stopped with the day 1 cycle 3 dose (04/29/2013) because of local progression of disease  (5) cyclophosphamide and doxorubicin started 05/20/2013, given in dose dense fashion with Neulasta support on day 2. Completed 4  cycles 07/03/2013, with the final cycle dose reduced 15% because of an episode of febrile neutropenia after cycle 3. Restaging studies 07/15/2013 showed partial response.  (6) started capecitabine April 2015, 1.5 g by mouth twice a day, 7 days on 7 days off--discontinued October 2015 with progression  (7) s/p Right simple mastectomy 02/17/2014 to optimize local control, the pathology ([id number]) showing an invasive ductal carcinoma measuring 4.8 cm, grade 3, with close but negative margins and evidence of treatment response  (a) nodule on right mastectomy scar noted 04/03/2014, removed 04/14/2014 (b) new nodule noted on R chest scar 05/19/2014 (c) radiation to R  chest wall completed 07/22/2014  (8) multiple brain metastases noted on brain MRI 02/26/2014--s/p whole brain irradiation completed 03/17/2014 (a) repeat brain MRI 05/12/2014 shows favorable response (b) repeat brain MRI 07/15/2014 shows questionable growth/ stable disease  (9) protein-calorie malnutrition  (10) high fall risk  (11) advanced directives: not in place; husband is healthcare power of attorney  (12) unexplained paroxysmal knee pain: possible ruptured Baker's cyst, Left, 05/01/2014  (13) doxil started 05/28/2014, repeated every 28 days. Cycle 3 delayed 2 weeks because of decrease in ejection fraction, which proved spurious (MUGA results 63% EF). Doxil esumed 04//11/2014 with evidence of response (a) MRI of the liver sixth 20 2016 shows stable disease  (14) c difficile colitis 11/12/2014, responding to metronidazole initially but repeat positive 07/16, when switched to oral vanco at standard doses  (15) admitted w urosepsis 12/27/2014  (16) agrees to limited code 12/28/2014 (written)  .Plan: I discussed Aleya's situation extensively w her and Tanda Rockers. She understands her cancer has continued to grow despite her current chemo. That is the  reason for the worsening abdominal pain (though there is also a compression fracture at L2 which may be contributing). In this setting, the chance of improvement w further chemo is small. The chance of significant side effects due to chemo are high. Given this situation, most patients opt for a palliative/ comfort care.   That is not however Fianna's choice. She would like to try some other treatment. Her goal is to make it to the birth of her grandchild, due date mid October. We agreed that afetr her brain MRI 08/30, if there has not been CNS progression, we will find some other chemotherapy for her.  At the same time, we discussed the fact that CPR and intubation in this setting, while unlikely to be needed, would be futile--worse, they could be traumatic especially for [name] if her were to witness it. After much discussion Dymphna, with [name] in the room, agreed to a no CPR and no intubation order.  I will follow closely with you. Greatly appreciate your help to this family!   Chauncey Cruel, MD 12/28/2014  8:45 PM Medical Oncology and Hematology [facility] [address] [place], [place] [address] Tel. [phone]    Fax. [phone]

## 2014-12-28 NOTE — Progress Notes (Signed)
[facility]                                                                              Patient Demographics  Carrie Berry, is a 64 y.o. female, DOB - June 18, 1950, GYF:[id number]  Admit date - 12/27/2014   Admitting Physician Louellen Molder, MD  Outpatient Primary MD for the patient is [name], MD  LOS - 1   Chief Complaint  Patient presents with  . Abdominal Pain      HPI on 12/27/2014 by Dr. Flonnie Overman Dhungel 64 year old female with history of metastatic breast cancer to the brain, lung, liver, metastatic lumbar spine fracture currently on Doxil chemotherapy (held last chemotherapy on 8/10 due to low-grade fever post and, on prolonged treatment for suspected C. difficile (symptoms of abdominal discomfort with cramping for several weeks), GERD, lower extremity DVT on Lovenox, severe protein calorie malnutrition, anxiety, focal motor seizure presented to the ED with severe epigastric pain and generalized weakness. Patient reports that since November of last year she has been having off and on crampy abdominal pain. Since the past 10-12 weeks she has been off and on treated for C. difficile initially with a course of Flagyl followed by 2 week course of vancomycin with persistent abdominal pain symptoms. Her staples were then positive for C. difficile and then was again placed on vancomycin which was stopped. During her last visit 4 days ago she was again restarted on oral vancomycin as her abdominal pain was persistent. Patient reports diffuse abdominal pain which is sharp and crampy in nature primarily involving the epigastric area. Denies radiation of the pain. She takes Dilaudid at home which has not been effective to control the pain. For the last 2 days the pain has been really severe. -Patient denies any fevers or chills, headaches, blurred vision, difficulty swallowing, nausea or vomiting. She denies any dysuria but reports history of urgency and her urine to be cloudy. Patient  denies headache, dizziness, fever, chills, nausea , vomiting, chest pain, palpitations, SOB, bowel symptoms. Reports poor appetite.   Assessment & Plan   Sepsis secondary to possible UTI -Upon admission, patient was febrile with tachycardia as well as leukocytosis. She was mildly hypotensive.  -Patient recently had episode of C. difficile in June and July 2016 and was on vancomycin by mouth -Currently on broad-spectrum anabiotic with vancomycin and Fortaz -Continue IVF -Spoke with infectious disease, who recommended continuing vancomycin PO for possibility of C. difficile recurrent -Continue to monitor CBC -Blood and urine cultures pending  Metastatic breast cancer -Metastasis to brain, spine, lung, liver -Follows with Dr. [name], oncology  Chronic systolic heart failure -Echocardiogram 06/26/2014, EF 40-45% -Will monitor closely, patient received several liters of fluid (~3L) -Monitor daily weights, intake/output  GERD -Continue PPI  Severe abdominal pain -CT showed no signs of inflammation or bowel obstruction -Possibly secondary to metastasis -Continue PPI and pain control -Lipase was normal, LFTs trending downward -Abdominal ultrasound: Showed contracted gallbladder however no cholecystitis  History of DVT -Continue full dose Lovenox  Prolonged QTC -Likely secondary to trazodone, Remeron, Compazine, cetrizine (medications held) -Magnesium 1.8  Failure to thrive -PT and nutrition consulted  Recent history of C. difficile colitis -Plan as above  Goals of care -Had discussion with patient regarding her CODE STATUS however at this time she wishes to remain full code.  Code Status: Full  Family Communication: None at bedside  Disposition Plan: Admitted  Time Spent in minutes   30 minutes  Procedures  None  Consults   Dr. [name], oncology  DVT Prophylaxis  Lovenox  Lab Results  Component Value Date   PLT 133* 12/28/2014     Medications  Scheduled Meds: . acidophilus  1 capsule Oral Daily  . acyclovir  400 mg Oral BID  . antiseptic oral rinse  7 mL Mouth Rinse q12n4p  . cefTAZidime (FORTAZ)  IV  1 g Intravenous 3 times per day  . chlorhexidine  15 mL Mouth Rinse BID  . cholecalciferol  1,000 Units Oral Daily  . dexamethasone  2 mg Oral Daily  . enoxaparin (LOVENOX) injection  1.5 mg/kg Subcutaneous QPC supper  . fluconazole  100 mg Oral Daily  . gabapentin  100 mg Oral TID  . levETIRAcetam  1,000 mg Oral BID  . methylphenidate  5 mg Oral BID WC  . mirtazapine  15 mg Oral QHS  . multivitamin with minerals  1 tablet Oral Daily  . pantoprazole (PROTONIX) IV  40 mg Intravenous Q12H  . sodium chloride  1,000 mL Intravenous Once  . sodium chloride  3 mL Intravenous Q12H  . vancomycin  125 mg Oral QID  . vancomycin  750 mg Intravenous Q12H   Continuous Infusions: . sodium chloride 125 mL (12/28/14 0433)   PRN Meds:.acetaminophen **OR** acetaminophen, HYDROmorphone (DILAUDID) injection, ondansetron **OR** ondansetron (ZOFRAN) IV, phenol  Antibiotics    Anti-infectives    Start     Dose/Rate Route Frequency Ordered Stop   12/28/14 1100  vancomycin (VANCOCIN) 50 mg/mL oral solution 125 mg     125 mg Oral 4 times daily 12/28/14 1007     12/28/14 0600  vancomycin (VANCOCIN) IVPB 750 mg/150 ml premix     750 mg 150 mL/hr over 60 Minutes Intravenous Every 12 hours 12/27/14 1430     12/27/14 2200  cefTAZidime (FORTAZ) 1 g in dextrose 5 % 50 mL IVPB     1 g 100 mL/hr over 30 Minutes Intravenous 3 times per day 12/27/14 1430     12/27/14 2200  acyclovir (ZOVIRAX) tablet 400 mg     400 mg Oral 2 times daily 12/27/14 1844     12/27/14 1845  fluconazole (DIFLUCAN) tablet 100 mg     100 mg Oral Daily 12/27/14 1844     12/27/14 1345  vancomycin (VANCOCIN) 1,500 mg in sodium chloride 0.9 % 500 mL IVPB     1,500 mg 250 mL/hr over 120 Minutes Intravenous  Once 12/27/14 1341 12/27/14 1727   12/27/14 1345   cefTAZidime (FORTAZ) 2 g in dextrose 5 % 50 mL IVPB     2 g 100 mL/hr over 30 Minutes Intravenous NOW 12/27/14 1342 12/27/14 1448   12/27/14 1330  vancomycin (VANCOCIN) 1,500 mg in sodium chloride 0.9 % 500 mL IVPB  Status:  Discontinued     1,500 mg 250 mL/hr over 120 Minutes Intravenous  Once 12/27/14 1320 12/27/14 1324        Subjective:   [name] seen and examined today.  Patient states she is feeling pain, particularly in her abdomen/epigastric area.  Denies nausea/vomiting at this time.  Denies chest pain, shortness of breath.     Objective:   Filed Vitals:   12/28/14 0700 12/28/14 4259  12/28/14 0800 12/28/14 0900  BP:  126/54 101/59 96/46  Pulse: 119 123 124 124  Temp:   98.3 F (36.8 C)   TempSrc:   Oral   Resp: 14 12 22 9   Height:      Weight:      SpO2: 99% 100% 100% 95%    Wt Readings from Last 3 Encounters:  12/28/14 68.8 kg (151 lb 10.8 oz)  11/25/14 64.547 kg (142 lb 4.8 oz)  11/10/14 64.2 kg (141 lb 8.6 oz)     Intake/Output Summary (Last 24 hours) at 12/28/14 1050 Last data filed at 12/28/14 0815  Gross per 24 hour  Intake   2878 ml  Output   1070 ml  Net   1808 ml    Exam  General: Well developed, well nourished, no distress  HEENT: NCAT, mucous membranes moist.   Cardiovascular: S1 S2 auscultated, tahycardic.  Respiratory: Clear to auscultation bilaterally with equal chest rise  Abdomen: Soft, RUQ and Epigastric TTP,  Mildly distended, + bowel sounds  Extremities: warm dry without cyanosis clubbing or edema  Neuro: AAOx3, nonfocal  Psych: Appropriate  Data Review   Micro Results Recent Results (from the past 240 hour(s))  TECHNOLOGIST REVIEW     Status: None   Collection Time: 12/23/14  9:40 AM  Result Value Ref Range Status   Technologist Review Metas and Myelocytes present, rare Nrbc  Final  Urine culture     Status: None (Preliminary result)   Collection Time: 12/27/14  4:45 PM  Result Value Ref Range Status   Specimen  Description URINE, CATHETERIZED  Final   Special Requests NONE  Final   Culture   Final    TOO YOUNG TO READ Performed at [facility]    Report Status PENDING  Incomplete  MRSA PCR Screening     Status: None   Collection Time: 12/27/14  6:40 PM  Result Value Ref Range Status   MRSA by PCR NEGATIVE NEGATIVE Final    Comment:        The GeneXpert MRSA Assay (FDA approved for NASAL specimens only), is one component of a comprehensive MRSA colonization surveillance program. It is not intended to diagnose MRSA infection nor to guide or monitor treatment for MRSA infections.     Radiology Reports Dg Chest 2 View  12/27/2014   CLINICAL DATA:  Shortness of breath.  EXAM: CHEST  2 VIEW  COMPARISON:  November 10, 2014.  FINDINGS: Stable cardiomegaly. Right internal jugular Port-A-Cath is unchanged in position with distal tip overlying expected position of the SVC. No pneumothorax or pleural effusion is noted. Two nodules are noted in the left upper lobe concerning for metastatic disease. Stable blunting of left costophrenic sulcus is noted consistent with small effusion or scarring. Small nodule is noted in right upper lobe.  IMPRESSION: Bilateral pulmonary nodules are noted concerning for metastatic disease. Stable blunting of left costophrenic sulcus consistent with scarring or small pleural effusion.   Electronically Signed   By: Marijo Conception, M.D.   On: 12/27/2014 14:25   Nm Bone Scan Whole Body  12/04/2014   CLINICAL DATA:  Breast cancer.  EXAM: NUCLEAR MEDICINE WHOLE BODY BONE SCAN  TECHNIQUE: Whole body anterior and posterior images were obtained approximately 3 hours after intravenous injection of radiopharmaceutical.  RADIOPHARMACEUTICALS:  27.4 mCi Technetium-68m MDP IV  COMPARISON:  CT 11/10/2014 .  PET-CT 01/07/2013.  FINDINGS: Bilateral renal function and excretion is noted. Increased activity noted over the L1 area.  Further evaluation of the lumbar spine to evaluate for  compression fracture or metastatic disease can be performed with MRI. No abnormality noted in this region on prior CT of 11/10/2014. No other abnormal bony lesions are noted.  IMPRESSION: Increased activity noted over the L1 region. This is suspicious for active process such as compression fracture or metastatic disease. No abnormality noted in this region on prior CT of 11/10/2014. Gadolinium-enhanced MRI of the lumbar spine can be obtained to further evaluate.   Electronically Signed   By: Marcello Moores  Register   On: 12/04/2014 12:49   Ct Abdomen Pelvis W Contrast  12/27/2014   CLINICAL DATA:  64 year old female on chemotherapy for metastatic breast cancer. Fever, abdominal pain, foul smelling urine. Initial encounter.  EXAM: CT ABDOMEN AND PELVIS WITH CONTRAST  TECHNIQUE: Multidetector CT imaging of the abdomen and pelvis was performed using the standard protocol following bolus administration of intravenous contrast.  CONTRAST:  140mL OMNIPAQUE IOHEXOL 300 MG/ML  SOLN  COMPARISON:  CT Abdomen and Pelvis 11/10/2014 and earlier  FINDINGS: Lower lobe lung metastases appear mildly progressed since June, now all ranging from 12-18 mm diameter (11-14 mm previously). Superimposed pulmonary atelectasis. No pericardial or pleural effusion.  There is a new mild superior endplate fracture of the L2 vertebral body. No overt metastatic disease identified in that vertebra. No destructive osseous lesion identified elsewhere.  Negative uterus and adnexa. No pelvic free fluid. Decompressed distal colon. There is mild bladder wall thickening and hyper enhancement compared to the prior study (series 2, image 82). No gas within the bladder. There is mild perivesical stranding.  Decompressed sigmoid colon. Negative left colon and distal transverse colon. Retained stool from the cecum to the proximal transverse colon. Negative terminal ileum. Appendix appears to remain normal. No dilated small bowel. Decompressed stomach and duodenum.   Numerous subcutaneous nodules in the ventral abdominal wall, likely related to subcutaneous injections. Progressed hypodense liver lesions, increased in size and number since June. Confluent right lobe involvement. There may be some associated right lobe biliary dilatation, uncertain. Trace perihepatic free fluid. The portal venous system remains patent.  Negative gallbladder, spleen, pancreas and adrenal glands. Renal enhancement and contrast excretion within normal limits. Small retroperitoneal lymph nodes are stable.  There is evidence of nonocclusive thrombus in both external iliac veins and both common femoral veins, appears more extensive on the left.  IMPRESSION: 1. Progressed pulmonary and hepatic metastatic disease since June. 2. Nonocclusive bilateral pelvic and femoral vein thrombus (nonocclusive DVTs). 3. Mild urinary bladder wall thickening and stranding compatible with urinary tract infection. 4. Acute to subacute L2 compression fracture which could be related to osteoporosis or metastatic disease.   Electronically Signed   By: Genevie Ann M.D.   On: 12/27/2014 16:22   US Abdomen Limited Ruq  12/27/2014   CLINICAL DATA:  Abdominal pain x2 days, elevated LFTs  EXAM: US ABDOMEN LIMITED - RIGHT UPPER QUADRANT  COMPARISON:  CT abdomen pelvis dated 12/27/2014.  FINDINGS: Gallbladder:  Contracted gallbladder. Secondary gallbladder wall thickening. No gallstones or pericholecystic fluid. Negative sonographic Murphy's sign.  Common bile duct:  Diameter: 2 mm.  Liver:  Heterogeneous appearance of the liver. Numerous ill-defined hypoechoic metastases are better visualized on recent CT abdomen pelvis.  IMPRESSION: Contracted gallbladder without associated sonographic findings to suggest acute cholecystitis.  Numerous ill-defined hypoechoic metastases, better evaluated on CT.   Electronically Signed   By: Julian Hy M.D.   On: 12/27/2014 18:02    CBC  Recent Labs  Lab 12/23/14 0940 12/27/14 1330  12/28/14 0437  WBC 9.4 14.0* 21.7*  HGB 13.3 11.5* 10.6*  HCT 40.3 36.0 34.0*  PLT 234 160 133*  MCV 102.0* 101.7* 103.0*  MCH 33.7 32.5 32.1  MCHC 33.0 31.9 31.2  RDW 18.1* 18.3* 18.6*  LYMPHSABS 1.4  --  0.7  MONOABS 1.4*  --  1.9*  EOSABS 0.0  --  0.0  BASOSABS 0.1  --  0.0    Chemistries   Recent Labs Lab 12/23/14 0941 12/27/14 1330 12/27/14 1708 12/28/14 0437  NA 140 136  --  140  K 3.9 4.0  --  3.7  CL  --  101  --  111  CO2 25 27  --  21*  GLUCOSE 109 102*  --  76  BUN 13.3 10  --  6  CREATININE 0.7 0.53  --  0.41*  CALCIUM 9.7 8.6*  --  7.9*  MG  --   --  1.8  --   AST 120* 113*  --  103*  ALT 140* 100*  --  89*  ALKPHOS 838* 622*  --  503*  BILITOT 0.67 1.0  --  1.3*   ------------------------------------------------------------------------------------------------------------------ estimated creatinine clearance is 70.3 mL/min (by C-G formula based on Cr of 0.41). ------------------------------------------------------------------------------------------------------------------ No results for input(s): HGBA1C in the last 72 hours. ------------------------------------------------------------------------------------------------------------------ No results for input(s): CHOL, HDL, LDLCALC, TRIG, CHOLHDL, LDLDIRECT in the last 72 hours. ------------------------------------------------------------------------------------------------------------------ No results for input(s): TSH, T4TOTAL, T3FREE, THYROIDAB in the last 72 hours.  Invalid input(s): FREET3 ------------------------------------------------------------------------------------------------------------------ No results for input(s): VITAMINB12, FOLATE, FERRITIN, TIBC, IRON, RETICCTPCT in the last 72 hours.  Coagulation profile No results for input(s): INR, PROTIME in the last 168 hours.  No results for input(s): DDIMER in the last 72 hours.  Cardiac Enzymes No results for input(s): CKMB, TROPONINI,  MYOGLOBIN in the last 168 hours.  Invalid input(s): CK ------------------------------------------------------------------------------------------------------------------ Invalid input(s): POCBNP    Erlinda Solinger D.O. on 12/28/2014 at 10:50 AM  Between 7am to 7pm - Pager - [phone]  After 7pm go to [email or web address] - password [email or web address]  And look for the night coverage person covering for me after hours  [facility] Office  [phone]

## 2014-12-28 NOTE — Evaluation (Signed)
Physical Therapy Evaluation Patient Details Name: Carrie Berry MRN: [id number] DOB: [birth year] Today's Date: 12/28/2014   History of Present Illness  Pt admitted through ED with R side abdominal pain.  Pt with hx Breast CA with mets to liver and brain.  Clinical Impression  Pt admitted as above and presenting with generalized weakness, ltd endurance and balance deficits limiting functional mobility.  Pt plans to dc home with husband and could benefit from follow up HHPT dependent on acute stay progress.    Follow Up Recommendations Home health PT    Equipment Recommendations  None recommended by PT    Recommendations for Other Services OT consult     Precautions / Restrictions Precautions Precautions: Fall Precaution Comments: FRAGILE SKIN Restrictions Weight Bearing Restrictions: No      Mobility  Bed Mobility Overal bed mobility: Needs Assistance;+2 for physical assistance;+ 2 for safety/equipment Bed Mobility: Supine to Sit;Sit to Supine     Supine to sit: Mod assist;+2 for physical assistance;+2 for safety/equipment Sit to supine: Max assist;+2 for physical assistance;+2 for safety/equipment   General bed mobility comments: cues for sequence and assist to log roll to L, manage LEs over EOB and bring trunk to upright.  Pt sitting x 4 min but c/o increased "wooziness" and returned to supine max assist  Transfers                 General transfer comment: NT - pt "woozy" in sitting  Ambulation/Gait                Stairs            Wheelchair Mobility    Modified Rankin (Stroke Patients Only)       Balance Overall balance assessment: Needs assistance Sitting-balance support: Bilateral upper extremity supported;Feet supported Sitting balance-Leahy Scale: Poor   Postural control: Right lateral lean                                   Pertinent Vitals/Pain Pain Assessment: 0-10 Pain Score: 4  Pain Location: R abdomen Pain  Descriptors / Indicators: Aching Pain Intervention(s): Limited activity within patient's tolerance;Monitored during session;Premedicated before session    Home Living Family/patient expects to be discharged to:: Private residence Living Arrangements: Spouse/significant other Available Help at Discharge: Family Type of Home: House Home Access: Stairs to enter Entrance Stairs-Rails: Psychiatric nurse of Steps: 5 Home Layout: One level Home Equipment: Environmental consultant - 4 wheels;Cane - single point;Wheelchair - manual      Prior Function Level of Independence: Needs assistance   Gait / Transfers Assistance Needed: uses a RW,  ADL's / Homemaking Assistance Needed: husband assisting with bathing, dressing, meals, housework        Hand Dominance   Dominant Hand: Right    Extremity/Trunk Assessment   Upper Extremity Assessment: Generalized weakness           Lower Extremity Assessment: Generalized weakness         Communication   Communication: No difficulties  Cognition Arousal/Alertness: Awake/alert Behavior During Therapy: WFL for tasks assessed/performed Overall Cognitive Status: Within Functional Limits for tasks assessed                      General Comments      Exercises        Assessment/Plan    PT Assessment Patient needs continued PT services  PT Diagnosis Difficulty walking;Generalized  weakness   PT Problem List Decreased strength;Decreased range of motion;Decreased activity tolerance;Decreased mobility;Decreased knowledge of use of DME;Pain  PT Treatment Interventions DME instruction;Gait training;Stair training;Functional mobility training;Therapeutic activities;Therapeutic exercise;Patient/family education   PT Goals (Current goals can be found in the Care Plan section) Acute Rehab PT Goals Patient Stated Goal: Home with husband PT Goal Formulation: With patient Time For Goal Achievement: 01/11/15 Potential to Achieve Goals:  Fair    Frequency Min 3X/week   Barriers to discharge        Co-evaluation               End of Session Equipment Utilized During Treatment: Oxygen Activity Tolerance: Patient limited by fatigue;Other (comment) (dizziness) Patient left: in bed;with call bell/phone within reach;with family/visitor present Nurse Communication: Mobility status         Time: 6063-0160 PT Time Calculation (min) (ACUTE ONLY): 26 min   Charges:   PT Evaluation $Initial PT Evaluation Tier I: 1 Procedure PT Treatments $Therapeutic Activity: 8-22 mins   PT G Codes:        BRADSHAW,HUNTER 30-Dec-2014, 12:35 PM

## 2014-12-28 NOTE — Care Management Note (Signed)
Case Management Note  Patient Details  Name: SELAH KLANG MRN: [id number] Date of Birth: 11/18/1950  Subjective/Objective:          Breast Ca with metas.  Fever and sepsis          Action/Plan: Date:  December 28, 2014 U.R. performed for needs and level of care. Will continue to follow for Case Management needs.  Velva Harman, RN, BSN, [place]   [phone]  Expected Discharge Date:   (unknown)               Expected Discharge Plan:  Home/Self Care  In-House Referral:  NA  Discharge planning Services  CM Consult  Post Acute Care Choice:  NA Choice offered to:  NA  DME Arranged:    DME Agency:     HH Arranged:    HH Agency:     Status of Service:  In process, will continue to follow  Medicare Important Message Given:    Date Medicare IM Given:    Medicare IM give by:    Date Additional Medicare IM Given:    Additional Medicare Important Message give by:     If discussed at [place] of Stay Meetings, dates discussed:    Additional Comments:  Leeroy Cha, RN 12/28/2014, 11:38 AM

## 2014-12-29 ENCOUNTER — Ambulatory Visit: Payer: BC Managed Care – PPO | Admitting: Nurse Practitioner

## 2014-12-29 DIAGNOSIS — R1084 Generalized abdominal pain: Secondary | ICD-10-CM

## 2014-12-29 DIAGNOSIS — I82409 Acute embolism and thrombosis of unspecified deep veins of unspecified lower extremity: Secondary | ICD-10-CM

## 2014-12-29 LAB — C DIFFICILE QUICK SCREEN W PCR REFLEX
C DIFFICLE (CDIFF) ANTIGEN: POSITIVE — AB
C Diff toxin: NEGATIVE

## 2014-12-29 LAB — COMPREHENSIVE METABOLIC PANEL
ALBUMIN: 1.8 g/dL — AB (ref 3.5–5.0)
ALT: 83 U/L — ABNORMAL HIGH (ref 14–54)
ANION GAP: 4 — AB (ref 5–15)
AST: 98 U/L — ABNORMAL HIGH (ref 15–41)
Alkaline Phosphatase: 416 U/L — ABNORMAL HIGH (ref 38–126)
BILIRUBIN TOTAL: 0.5 mg/dL (ref 0.3–1.2)
BUN: 7 mg/dL (ref 6–20)
CHLORIDE: 111 mmol/L (ref 101–111)
CO2: 22 mmol/L (ref 22–32)
Calcium: 7.7 mg/dL — ABNORMAL LOW (ref 8.9–10.3)
Creatinine, Ser: 0.46 mg/dL (ref 0.44–1.00)
GFR calc Af Amer: >60 mL/min (ref >60)
Glucose, Bld: 168 mg/dL — ABNORMAL HIGH (ref 65–99)
POTASSIUM: 3.3 mmol/L — AB (ref 3.5–5.1)
Sodium: 137 mmol/L (ref 135–145)
TOTAL PROTEIN: 5.4 g/dL — AB (ref 6.5–8.1)

## 2014-12-29 LAB — CBC
HEMATOCRIT: 31.3 % — AB (ref 36.0–46.0)
Hemoglobin: 9.9 g/dL — ABNORMAL LOW (ref 12.0–15.0)
MCH: 32.1 pg (ref 26.0–34.0)
MCHC: 31.6 g/dL (ref 30.0–36.0)
MCV: 101.6 fL — AB (ref 78.0–100.0)
PLATELETS: 121 10*3/uL — AB (ref 150–400)
RBC: 3.08 MIL/uL — ABNORMAL LOW (ref 3.87–5.11)
RDW: 18.2 % — AB (ref 11.5–15.5)
WBC: 22.2 10*3/uL — ABNORMAL HIGH (ref 4.0–10.5)

## 2014-12-29 MED ORDER — PANTOPRAZOLE SODIUM 40 MG PO TBEC
40.0000 mg | DELAYED_RELEASE_TABLET | Freq: Two times a day (BID) | ORAL | Status: DC
  Administered 2014-12-29 – 2015-01-01 (×5): 40 mg via ORAL
  Filled 2014-12-29 (×9): qty 1

## 2014-12-29 MED ORDER — POTASSIUM CHLORIDE CRYS ER 20 MEQ PO TBCR
40.0000 meq | EXTENDED_RELEASE_TABLET | Freq: Once | ORAL | Status: AC
Start: 2014-12-29 — End: 2014-12-29
  Administered 2014-12-29: 40 meq via ORAL
  Filled 2014-12-29: qty 2

## 2014-12-29 NOTE — Progress Notes (Signed)
The patient is receiving Protonix by the intravenous route.  Based on criteria approved by the Pharmacy and [place], the medication is being converted to the equivalent oral dose form.  These criteria include: -No Active GI bleeding -Able to tolerate diet of full liquids (or better) or tube feeding OR able to tolerate other medications by the oral or enteral route  If you have any questions about this conversion, please contact the Pharmacy Department (ext [phone]).  Thank you.  Biagio Borg, [facility] 12/29/2014 3:13 PM

## 2014-12-29 NOTE — Progress Notes (Signed)
[facility]                                                                              Patient Demographics  Carrie Berry, is a 64 y.o. female, DOB - [birth year], SWN:[id number]  Admit date - 12/27/2014   Admitting Physician Cristal Ford, DO  Outpatient Primary MD for the patient is [name], MD  LOS - 2   Chief Complaint  Patient presents with  . 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 Patient found to have sepsis secondary to possible UTI. Blood and urine culture still pending. Currently on broad-spectrum antibiotics vancomycin and Fortaz. Vital signs have stabilized. Patient transferred to telemetry floor.  Assessment & Plan   Sepsis secondary to possible UTI -Upon admission, patient was febrile with tachycardia as well as leukocytosis. She was mildly hypotensive.  -Patient recently had episode of C. difficile in June and July 2016 and was on vancomycin by mouth -Currently on broad-spectrum anabiotic with vancomycin and Tressie Ellis -Spoke with infectious disease, who recommended continuing vancomycin PO for possibility of C. difficile recurrence -Continue to monitor CBC -Blood and urine cultures pending  Metastatic breast cancer -Metastasis to brain, spine, lung, liver -Follows with Dr. [name], oncology  Chronic systolic heart failure -Echocardiogram 06/26/2014, EF 40-45% -Monitor daily weights, intake/output  GERD -Continue PPI  Severe abdominal pain -CT showed no signs of inflammation or bowel obstruction -Possibly secondary to metastasis -Continue PPI and pain control -Lipase was normal, LFTs trending downward -Abdominal ultrasound: Showed contracted gallbladder however no cholecystitis  History of DVT -Continue full dose Lovenox  Prolonged QTC -Likely secondary to trazodone, Remeron, Compazine,  cetrizine (medications held) -Magnesium 1.8  Failure to thrive -PT and nutrition consulted  Recent history of C. difficile colitis -Plan as above  Goals of care -Continue to treat.  Not ready for palliative care consult. Currently partial code.  Code Status: Partial code, no CPR, no intubation  Family Communication: None at bedside  Disposition Plan: Admitted. Patient's vital signs have stabilized. We'll transfer her to telemetry floor.  Time Spent in minutes   30 minutes  Procedures  None  Consults   Dr. [name], oncology  DVT Prophylaxis  Lovenox  Lab Results  Component Value Date   PLT 121* 12/29/2014    Medications  Scheduled Meds: . acidophilus  1 capsule Oral Daily  . acyclovir  400 mg Oral BID  . antiseptic oral rinse  7 mL Mouth Rinse q12n4p  . cefTAZidime (FORTAZ)  IV  1 g Intravenous 3 times per day  . chlorhexidine  15 mL Mouth Rinse BID  . cholecalciferol  1,000 Units Oral Daily  . dexamethasone  2 mg Oral Daily  . enoxaparin (LOVENOX) injection  1.5 mg/kg Subcutaneous QPC supper  . fluconazole  100 mg Oral Daily  . gabapentin  100 mg Oral TID  . levETIRAcetam  1,000 mg Oral BID  . methylphenidate  5 mg Oral BID WC  . mirtazapine  15 mg Oral QHS  . multivitamin with minerals  1 tablet Oral Daily  .  pantoprazole (PROTONIX) IV  40 mg Intravenous Q12H  . sodium chloride  1,000 mL Intravenous Once  . sodium chloride  3 mL Intravenous Q12H  . vancomycin  125 mg Oral QID  . vancomycin  750 mg Intravenous Q12H   Continuous Infusions: . sodium chloride 125 mL/hr at 12/29/14 0700   PRN Meds:.acetaminophen **OR** acetaminophen, HYDROmorphone (DILAUDID) injection, ondansetron **OR** ondansetron (ZOFRAN) IV, phenol  Antibiotics    Anti-infectives    Start     Dose/Rate Route Frequency Ordered Stop   12/28/14 1100  vancomycin (VANCOCIN) 50 mg/mL oral solution 125 mg     125 mg Oral 4 times daily 12/28/14 1007     12/28/14 0600  vancomycin (VANCOCIN)  IVPB 750 mg/150 ml premix     750 mg 150 mL/hr over 60 Minutes Intravenous Every 12 hours 12/27/14 1430     12/27/14 2200  cefTAZidime (FORTAZ) 1 g in dextrose 5 % 50 mL IVPB     1 g 100 mL/hr over 30 Minutes Intravenous 3 times per day 12/27/14 1430     12/27/14 2200  acyclovir (ZOVIRAX) tablet 400 mg     400 mg Oral 2 times daily 12/27/14 1844     12/27/14 1845  fluconazole (DIFLUCAN) tablet 100 mg     100 mg Oral Daily 12/27/14 1844     12/27/14 1345  vancomycin (VANCOCIN) 1,500 mg in sodium chloride 0.9 % 500 mL IVPB     1,500 mg 250 mL/hr over 120 Minutes Intravenous  Once 12/27/14 1341 12/27/14 1727   12/27/14 1345  cefTAZidime (FORTAZ) 2 g in dextrose 5 % 50 mL IVPB     2 g 100 mL/hr over 30 Minutes Intravenous NOW 12/27/14 1342 12/27/14 1448   12/27/14 1330  vancomycin (VANCOCIN) 1,500 mg in sodium chloride 0.9 % 500 mL IVPB  Status:  Discontinued     1,500 mg 250 mL/hr over 120 Minutes Intravenous  Once 12/27/14 1320 12/27/14 1324        Subjective:   Carrie Berry seen and examined today.  Patient denies pain at this time. States she is feeling better.  Denies chest pain, shortness of breath, nausea or vomiting. Feels her abdominal pain is improved slightly.   Objective:   Filed Vitals:   12/29/14 0700 12/29/14 0800 12/29/14 0900 12/29/14 1030  BP: 149/63 129/61 109/65 109/59  Pulse: 89 78 91 100  Temp:  97.7 F (36.5 C)  98.7 F (37.1 C)  TempSrc:  Oral  Oral  Resp: 12 9 13 14   Height:      Weight:      SpO2: 100% 100% 100% 100%    Wt Readings from Last 3 Encounters:  12/28/14 68.8 kg (151 lb 10.8 oz)  11/25/14 64.547 kg (142 lb 4.8 oz)  11/10/14 64.2 kg (141 lb 8.6 oz)     Intake/Output Summary (Last 24 hours) at 12/29/14 1133 Last data filed at 12/29/14 0700  Gross per 24 hour  Intake   3135 ml  Output   1075 ml  Net   2060 ml    Exam  General: Well developed, well nourished, no distress  HEENT: NCAT, mucous membranes moist.    Cardiovascular: S1 S2 auscultated, RRR  Respiratory: Clear to auscultation   Abdomen: Soft, mild RUQ and Epigastric TTP,  Mildly distended, + bowel sounds  Extremities: warm dry without cyanosis clubbing or edema  Neuro: AAOx3, nonfocal  Psych: Appropriate mood and affect, pleasant  Data Review   Micro Results Recent  Results (from the past 240 hour(s))  TECHNOLOGIST REVIEW     Status: None   Collection Time: 12/23/14  9:40 AM  Result Value Ref Range Status   Technologist Review Metas and Myelocytes present, rare Nrbc  Final  Blood Culture (routine x 2)     Status: None (Preliminary result)   Collection Time: 12/27/14  1:30 PM  Result Value Ref Range Status   Specimen Description PORTA CATH  Final   Special Requests BOTTLES DRAWN AEROBIC AND ANAEROBIC 6CC  Final   Culture   Final    NO GROWTH < 24 HOURS Performed at [facility]    Report Status PENDING  Incomplete  Urine culture     Status: None (Preliminary result)   Collection Time: 12/27/14  4:45 PM  Result Value Ref Range Status   Specimen Description URINE, CATHETERIZED  Final   Special Requests NONE  Final   Culture   Final    TOO YOUNG TO READ Performed at [facility]    Report Status PENDING  Incomplete  MRSA PCR Screening     Status: None   Collection Time: 12/27/14  6:40 PM  Result Value Ref Range Status   MRSA by PCR NEGATIVE NEGATIVE Final    Comment:        The GeneXpert MRSA Assay (FDA approved for NASAL specimens only), is one component of a comprehensive MRSA colonization surveillance program. It is not intended to diagnose MRSA infection nor to guide or monitor treatment for MRSA infections.     Radiology Reports Dg Chest 2 View  12/27/2014   CLINICAL DATA:  Shortness of breath.  EXAM: CHEST  2 VIEW  COMPARISON:  November 10, 2014.  FINDINGS: Stable cardiomegaly. Right internal jugular Port-A-Cath is unchanged in position with distal tip overlying expected position of the SVC.  No pneumothorax or pleural effusion is noted. Two nodules are noted in the left upper lobe concerning for metastatic disease. Stable blunting of left costophrenic sulcus is noted consistent with small effusion or scarring. Small nodule is noted in right upper lobe.  IMPRESSION: Bilateral pulmonary nodules are noted concerning for metastatic disease. Stable blunting of left costophrenic sulcus consistent with scarring or small pleural effusion.   Electronically Signed   By: Marijo Conception, M.D.   On: 12/27/2014 14:25   Nm Bone Scan Whole Body  12/04/2014   CLINICAL DATA:  Breast cancer.  EXAM: NUCLEAR MEDICINE WHOLE BODY BONE SCAN  TECHNIQUE: Whole body anterior and posterior images were obtained approximately 3 hours after intravenous injection of radiopharmaceutical.  RADIOPHARMACEUTICALS:  27.4 mCi Technetium-67m MDP IV  COMPARISON:  CT 11/10/2014 .  PET-CT 01/07/2013.  FINDINGS: Bilateral renal function and excretion is noted. Increased activity noted over the L1 area. Further evaluation of the lumbar spine to evaluate for compression fracture or metastatic disease can be performed with MRI. No abnormality noted in this region on prior CT of 11/10/2014. No other abnormal bony lesions are noted.  IMPRESSION: Increased activity noted over the L1 region. This is suspicious for active process such as compression fracture or metastatic disease. No abnormality noted in this region on prior CT of 11/10/2014. Gadolinium-enhanced MRI of the lumbar spine can be obtained to further evaluate.   Electronically Signed   By: Marcello Moores  Register   On: 12/04/2014 12:49   Ct Abdomen Pelvis W Contrast  12/27/2014   CLINICAL DATA:  64 year old female on chemotherapy for metastatic breast cancer. Fever, abdominal pain, foul smelling urine. Initial  encounter.  EXAM: CT ABDOMEN AND PELVIS WITH CONTRAST  TECHNIQUE: Multidetector CT imaging of the abdomen and pelvis was performed using the standard protocol following bolus  administration of intravenous contrast.  CONTRAST:  171mL OMNIPAQUE IOHEXOL 300 MG/ML  SOLN  COMPARISON:  CT Abdomen and Pelvis 11/10/2014 and earlier  FINDINGS: Lower lobe lung metastases appear mildly progressed since June, now all ranging from 12-18 mm diameter (11-14 mm previously). Superimposed pulmonary atelectasis. No pericardial or pleural effusion.  There is a new mild superior endplate fracture of the L2 vertebral body. No overt metastatic disease identified in that vertebra. No destructive osseous lesion identified elsewhere.  Negative uterus and adnexa. No pelvic free fluid. Decompressed distal colon. There is mild bladder wall thickening and hyper enhancement compared to the prior study (series 2, image 82). No gas within the bladder. There is mild perivesical stranding.  Decompressed sigmoid colon. Negative left colon and distal transverse colon. Retained stool from the cecum to the proximal transverse colon. Negative terminal ileum. Appendix appears to remain normal. No dilated small bowel. Decompressed stomach and duodenum.  Numerous subcutaneous nodules in the ventral abdominal wall, likely related to subcutaneous injections. Progressed hypodense liver lesions, increased in size and number since June. Confluent right lobe involvement. There may be some associated right lobe biliary dilatation, uncertain. Trace perihepatic free fluid. The portal venous system remains patent.  Negative gallbladder, spleen, pancreas and adrenal glands. Renal enhancement and contrast excretion within normal limits. Small retroperitoneal lymph nodes are stable.  There is evidence of nonocclusive thrombus in both external iliac veins and both common femoral veins, appears more extensive on the left.  IMPRESSION: 1. Progressed pulmonary and hepatic metastatic disease since June. 2. Nonocclusive bilateral pelvic and femoral vein thrombus (nonocclusive DVTs). 3. Mild urinary bladder wall thickening and stranding compatible  with urinary tract infection. 4. Acute to subacute L2 compression fracture which could be related to osteoporosis or metastatic disease.   Electronically Signed   By: Genevie Ann M.D.   On: 12/27/2014 16:22   US Abdomen Limited Ruq  12/27/2014   CLINICAL DATA:  Abdominal pain x2 days, elevated LFTs  EXAM: US ABDOMEN LIMITED - RIGHT UPPER QUADRANT  COMPARISON:  CT abdomen pelvis dated 12/27/2014.  FINDINGS: Gallbladder:  Contracted gallbladder. Secondary gallbladder wall thickening. No gallstones or pericholecystic fluid. Negative sonographic Murphy's sign.  Common bile duct:  Diameter: 2 mm.  Liver:  Heterogeneous appearance of the liver. Numerous ill-defined hypoechoic metastases are better visualized on recent CT abdomen pelvis.  IMPRESSION: Contracted gallbladder without associated sonographic findings to suggest acute cholecystitis.  Numerous ill-defined hypoechoic metastases, better evaluated on CT.   Electronically Signed   By: Julian Hy M.D.   On: 12/27/2014 18:02    CBC  Recent Labs Lab 12/23/14 0940 12/27/14 1330 12/28/14 0437 12/29/14 0515  WBC 9.4 14.0* 21.7* 22.2*  HGB 13.3 11.5* 10.6* 9.9*  HCT 40.3 36.0 34.0* 31.3*  PLT 234 160 133* 121*  MCV 102.0* 101.7* 103.0* 101.6*  MCH 33.7 32.5 32.1 32.1  MCHC 33.0 31.9 31.2 31.6  RDW 18.1* 18.3* 18.6* 18.2*  LYMPHSABS 1.4  --  0.7  --   MONOABS 1.4*  --  1.9*  --   EOSABS 0.0  --  0.0  --   BASOSABS 0.1  --  0.0  --     Chemistries   Recent Labs Lab 12/23/14 0941 12/27/14 1330 12/27/14 1708 12/28/14 0437 12/29/14 0515  NA 140 136  --  140 137  K 3.9 4.0  --  3.7 3.3*  CL  --  101  --  111 111  CO2 25 27  --  21* 22  GLUCOSE 109 102*  --  76 168*  BUN 13.3 10  --  6 7  CREATININE 0.7 0.53  --  0.41* 0.46  CALCIUM 9.7 8.6*  --  7.9* 7.7*  MG  --   --  1.8  --   --   AST 120* 113*  --  103* 98*  ALT 140* 100*  --  89* 83*  ALKPHOS 838* 622*  --  503* 416*  BILITOT 0.67 1.0  --  1.3* 0.5    ------------------------------------------------------------------------------------------------------------------ estimated creatinine clearance is 70.3 mL/min (by C-G formula based on Cr of 0.46). ------------------------------------------------------------------------------------------------------------------ No results for input(s): HGBA1C in the last 72 hours. ------------------------------------------------------------------------------------------------------------------ No results for input(s): CHOL, HDL, LDLCALC, TRIG, CHOLHDL, LDLDIRECT in the last 72 hours. ------------------------------------------------------------------------------------------------------------------ No results for input(s): TSH, T4TOTAL, T3FREE, THYROIDAB in the last 72 hours.  Invalid input(s): FREET3 ------------------------------------------------------------------------------------------------------------------ No results for input(s): VITAMINB12, FOLATE, FERRITIN, TIBC, IRON, RETICCTPCT in the last 72 hours.  Coagulation profile No results for input(s): INR, PROTIME in the last 168 hours.  No results for input(s): DDIMER in the last 72 hours.  Cardiac Enzymes No results for input(s): CKMB, TROPONINI, MYOGLOBIN in the last 168 hours.  Invalid input(s): CK ------------------------------------------------------------------------------------------------------------------ Invalid input(s): POCBNP    Libi Corso D.O. on 12/29/2014 at 11:33 AM  Between 7am to 7pm - Pager - [phone]  After 7pm go to [email or web address] - password [email or web address]  And look for the night coverage person covering for me after hours  [facility] Office  [phone]

## 2014-12-29 NOTE — Progress Notes (Signed)
CRITICAL VALUE ALERT  Critical value received:  Positive C Dif Antigen  Date of notification:  12/29/14  Time of notification:  8719  Critical value read back:Yes.    Nurse who received alert:  Catie Conley Canal  MD notified (1st page):  Dr. Ree Kida  Time of first page:  70  MD notified (2nd page):  Time of second page:  Responding MD:  Dr. Ree Kida  Time MD responded:  [phone]

## 2014-12-30 ENCOUNTER — Encounter: Payer: Self-pay | Admitting: Oncology

## 2014-12-30 ENCOUNTER — Telehealth: Payer: Self-pay | Admitting: Oncology

## 2014-12-30 DIAGNOSIS — E43 Unspecified severe protein-calorie malnutrition: Secondary | ICD-10-CM

## 2014-12-30 DIAGNOSIS — C801 Malignant (primary) neoplasm, unspecified: Secondary | ICD-10-CM

## 2014-12-30 DIAGNOSIS — C50919 Malignant neoplasm of unspecified site of unspecified female breast: Secondary | ICD-10-CM

## 2014-12-30 DIAGNOSIS — I82403 Acute embolism and thrombosis of unspecified deep veins of lower extremity, bilateral: Secondary | ICD-10-CM

## 2014-12-30 DIAGNOSIS — C799 Secondary malignant neoplasm of unspecified site: Secondary | ICD-10-CM

## 2014-12-30 DIAGNOSIS — C50911 Malignant neoplasm of unspecified site of right female breast: Secondary | ICD-10-CM

## 2014-12-30 DIAGNOSIS — K219 Gastro-esophageal reflux disease without esophagitis: Secondary | ICD-10-CM

## 2014-12-30 LAB — COMPREHENSIVE METABOLIC PANEL
ALBUMIN: 2 g/dL — AB (ref 3.5–5.0)
ALT: 74 U/L — ABNORMAL HIGH (ref 14–54)
AST: 71 U/L — AB (ref 15–41)
Alkaline Phosphatase: 418 U/L — ABNORMAL HIGH (ref 38–126)
Anion gap: 8 (ref 5–15)
BUN: 7 mg/dL (ref 6–20)
CHLORIDE: 114 mmol/L — AB (ref 101–111)
CO2: 21 mmol/L — AB (ref 22–32)
Calcium: 8.2 mg/dL — ABNORMAL LOW (ref 8.9–10.3)
Creatinine, Ser: 0.38 mg/dL — ABNORMAL LOW (ref 0.44–1.00)
GFR calc Af Amer: >60 mL/min (ref >60)
GFR calc non Af Amer: >60 mL/min (ref >60)
GLUCOSE: 104 mg/dL — AB (ref 65–99)
POTASSIUM: 3.7 mmol/L (ref 3.5–5.1)
SODIUM: 143 mmol/L (ref 135–145)
Total Bilirubin: 0.4 mg/dL (ref 0.3–1.2)
Total Protein: 4.9 g/dL — ABNORMAL LOW (ref 6.5–8.1)

## 2014-12-30 LAB — CBC
HCT: 29.6 % — ABNORMAL LOW (ref 36.0–46.0)
Hemoglobin: 9.4 g/dL — ABNORMAL LOW (ref 12.0–15.0)
MCH: 32.1 pg (ref 26.0–34.0)
MCHC: 31.8 g/dL (ref 30.0–36.0)
MCV: 101 fL — ABNORMAL HIGH (ref 78.0–100.0)
PLATELETS: 132 10*3/uL — AB (ref 150–400)
RBC: 2.93 MIL/uL — ABNORMAL LOW (ref 3.87–5.11)
RDW: 17.9 % — AB (ref 11.5–15.5)
WBC: 16.9 10*3/uL — AB (ref 4.0–10.5)

## 2014-12-30 NOTE — Progress Notes (Signed)
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 Patient found to have sepsis secondary to possible UTI. Blood and urine culture still pending. Currently on broad-spectrum antibiotics vancomycin and Fortaz. Vital signs have stabilized. Patient transferred to telemetry floor.  Assessment & Plan   Sepsis secondary to possible UTI -Upon admission, patient was febrile with tachycardia as well as leukocytosis. She was mildly hypotensive.  -Patient recently had episode of C. difficile in June and July 2016 and was on vancomycin by mouth -Currently on broad-spectrum anabiotic with vancomycin and Tressie Ellis -Spoke with infectious disease, who recommended continuing vancomycin PO for possibility of C. difficile recurrence -Continue to monitor CBC -UC=E.coli, culture and sensitivities are pending  Metastatic breast cancer with poor response to chemo -Metastasis to brain, spine, lung, liver -Follows with Dr. [name], oncology -Patial code stauts  Chronic systolic heart failure -Echocardiogram 06/26/2014, EF 40-45% -Monitor daily weights, intake/output + 6 L so far so hold IV fluid/cut back rate to 75 cc an hour  GERD -Continue PPI  Severe abdominal pain -CT showed no signs of inflammation or bowel obstruction -Possibly secondary to L2 # with radiation -Continue PPI and pain control -Lipase was normal, LFTs trending downward -Abdominal ultrasound: Showed contracted gallbladder however no cholecystitis  History of DVT -Continue full dose Lovenox  Prolonged QTC -Likely secondary to trazodone, Remeron, Compazine, cetrizine (medications held) -Magnesium 1.8  Failure to thrive -PT and nutrition consulted  Recent history of C. difficile colitis -Plan as above  Goals of  care -Continue to treat.  Not ready for palliative care consult. Currently partial code.  Code Status: Partial code, no CPR, no intubation  Family Communication: Discussed with relative at the bedside and explained completely plan of care  Disposition Plan: Admitted. Patient's vital signs have stabilized.=  Time Spent in minutes   30 minutes  Procedures  None  Consults   Dr. [name], oncology  DVT Prophylaxis  Lovenox  Lab Results  Component Value Date   PLT 132* 12/30/2014    Medications  Scheduled Meds: . acidophilus  1 capsule Oral Daily  . acyclovir  400 mg Oral BID  . antiseptic oral rinse  7 mL Mouth Rinse q12n4p  . cefTAZidime (FORTAZ)  IV  1 g Intravenous 3 times per day  . chlorhexidine  15 mL Mouth Rinse BID  . cholecalciferol  1,000 Units Oral Daily  . dexamethasone  2 mg Oral Daily  . enoxaparin (LOVENOX) injection  1.5 mg/kg Subcutaneous QPC supper  . fluconazole  100 mg Oral Daily  . gabapentin  100 mg Oral TID  . levETIRAcetam  1,000 mg Oral BID  . methylphenidate  5 mg Oral BID WC  . mirtazapine  15 mg Oral QHS  . multivitamin with minerals  1 tablet Oral Daily  . pantoprazole  40 mg Oral BID AC  . sodium chloride  1,000 mL Intravenous Once  . sodium chloride  3 mL Intravenous Q12H  .  vancomycin  125 mg Oral QID   Continuous Infusions: . sodium chloride 125 mL/hr at 12/30/14 1333   PRN Meds:.acetaminophen **OR** acetaminophen, HYDROmorphone (DILAUDID) injection, ondansetron **OR** ondansetron (ZOFRAN) IV, phenol  Antibiotics    Anti-infectives    Start     Dose/Rate Route Frequency Ordered Stop   12/28/14 1100  vancomycin (VANCOCIN) 50 mg/mL oral solution 125 mg     125 mg Oral 4 times daily 12/28/14 1007     12/28/14 0600  vancomycin (VANCOCIN) IVPB 750 mg/150 ml premix  Status:  Discontinued     750 mg 150 mL/hr over 60 Minutes Intravenous Every 12 hours 12/27/14 1430 12/30/14 1140   12/27/14 2200  cefTAZidime (FORTAZ) 1 g in dextrose 5 %  50 mL IVPB     1 g 100 mL/hr over 30 Minutes Intravenous 3 times per day 12/27/14 1430     12/27/14 2200  acyclovir (ZOVIRAX) tablet 400 mg     400 mg Oral 2 times daily 12/27/14 1844     12/27/14 1845  fluconazole (DIFLUCAN) tablet 100 mg     100 mg Oral Daily 12/27/14 1844     12/27/14 1345  vancomycin (VANCOCIN) 1,500 mg in sodium chloride 0.9 % 500 mL IVPB     1,500 mg 250 mL/hr over 120 Minutes Intravenous  Once 12/27/14 1341 12/27/14 1727   12/27/14 1345  cefTAZidime (FORTAZ) 2 g in dextrose 5 % 50 mL IVPB     2 g 100 mL/hr over 30 Minutes Intravenous NOW 12/27/14 1342 12/27/14 1448   12/27/14 1330  vancomycin (VANCOCIN) 1,500 mg in sodium chloride 0.9 % 500 mL IVPB  Status:  Discontinued     1,500 mg 250 mL/hr over 120 Minutes Intravenous  Once 12/27/14 1320 12/27/14 1324        Subjective:   Still has some abdominal pain may be 7 on 10 Feels that it is "stretching of the liver" Many questions about chemotherapy and further plans Had 2 loose but formed stools today Tolerating some diet Interest in working with therapy No chest pain no shortness of breath    Objective:   Filed Vitals:   12/29/14 1402 12/29/14 2119 12/30/14 0539 12/30/14 1309  BP: 106/64 104/64 120/72 115/64  Pulse: 95 81 80 81  Temp: 98.3 F (36.8 C) 98.6 F (37 C) 98.6 F (37 C) 98.2 F (36.8 C)  TempSrc: Oral Oral Oral Oral  Resp: 16 16  18   Height:      Weight:      SpO2: 98% 99% 99% 98%    Wt Readings from Last 3 Encounters:  12/28/14 68.8 kg (151 lb 10.8 oz)  11/25/14 64.547 kg (142 lb 4.8 oz)  11/10/14 64.2 kg (141 lb 8.6 oz)     Intake/Output Summary (Last 24 hours) at 12/30/14 1431 Last data filed at 12/30/14 0740  Gross per 24 hour  Intake 3559.58 ml  Output   2500 ml  Net 1059.58 ml    Exam  General: Well developed, well nourished, no distress  HEENT: NCAT, mucous membranes moist.   Cardiovascular: S1 S2 auscultated, RRR  Respiratory: Clear to auscultation    Abdomen: Soft, mild RUQ and Epigastric TTP--tip of liver felt in midline,  Mildly distended, + bowel sounds  Data Review   Micro Results Recent Results (from the past 240 hour(s))  TECHNOLOGIST REVIEW     Status: None   Collection Time: 12/23/14  9:40 AM  Result Value Ref Range Status   Technologist  Review Metas and Myelocytes present, rare Nrbc  Final  Blood Culture (routine x 2)     Status: None (Preliminary result)   Collection Time: 12/27/14  1:30 PM  Result Value Ref Range Status   Specimen Description PORTA CATH  Final   Special Requests BOTTLES DRAWN AEROBIC AND ANAEROBIC [place]  Final   Culture   Final    NO GROWTH 3 DAYS Performed at [facility]    Report Status PENDING  Incomplete  Urine culture     Status: None   Collection Time: 12/27/14  4:45 PM  Result Value Ref Range Status   Specimen Description URINE, CATHETERIZED  Final   Special Requests NONE  Final   Culture   Final    >=100,000 COLONIES/mL ESCHERICHIA COLI Performed at [facility]    Report Status 12/30/2014 FINAL  Final  MRSA PCR Screening     Status: None   Collection Time: 12/27/14  6:40 PM  Result Value Ref Range Status   MRSA by PCR NEGATIVE NEGATIVE Final    Comment:        The GeneXpert MRSA Assay (FDA approved for NASAL specimens only), is one component of a comprehensive MRSA colonization surveillance program. It is not intended to diagnose MRSA infection nor to guide or monitor treatment for MRSA infections.   C difficile quick scan w PCR reflex     Status: Abnormal   Collection Time: 12/29/14 12:21 PM  Result Value Ref Range Status   C Diff antigen POSITIVE (A) NEGATIVE Final    Comment: CRITICAL RESULT CALLED TO, READ BACK BY AND VERIFIED WITH: [place] [id number] @ [id number] BY J SCOTTON    C Diff toxin NEGATIVE NEGATIVE Final   C Diff interpretation   Final    C. difficile present, but toxin not detected. This indicates colonization. In most cases, this does not  require treatment. If patient has signs and symptoms consistent with colitis, consider treatment.    Radiology Reports Dg Chest 2 View  12/27/2014   CLINICAL DATA:  Shortness of breath.  EXAM: CHEST  2 VIEW  COMPARISON:  November 10, 2014.  FINDINGS: Stable cardiomegaly. Right internal jugular Port-A-Cath is unchanged in position with distal tip overlying expected position of the SVC. No pneumothorax or pleural effusion is noted. Two nodules are noted in the left upper lobe concerning for metastatic disease. Stable blunting of left costophrenic sulcus is noted consistent with small effusion or scarring. Small nodule is noted in right upper lobe.  IMPRESSION: Bilateral pulmonary nodules are noted concerning for metastatic disease. Stable blunting of left costophrenic sulcus consistent with scarring or small pleural effusion.   Electronically Signed   By: Marijo Conception, M.D.   On: 12/27/2014 14:25   Nm Bone Scan Whole Body  12/04/2014   CLINICAL DATA:  Breast cancer.  EXAM: NUCLEAR MEDICINE WHOLE BODY BONE SCAN  TECHNIQUE: Whole body anterior and posterior images were obtained approximately 3 hours after intravenous injection of radiopharmaceutical.  RADIOPHARMACEUTICALS:  27.4 mCi Technetium-17m MDP IV  COMPARISON:  CT 11/10/2014 .  PET-CT 01/07/2013.  FINDINGS: Bilateral renal function and excretion is noted. Increased activity noted over the L1 area. Further evaluation of the lumbar spine to evaluate for compression fracture or metastatic disease can be performed with MRI. No abnormality noted in this region on prior CT of 11/10/2014. No other abnormal bony lesions are noted.  IMPRESSION: Increased activity noted over the L1 region. This is suspicious for active process such  as compression fracture or metastatic disease. No abnormality noted in this region on prior CT of 11/10/2014. Gadolinium-enhanced MRI of the lumbar spine can be obtained to further evaluate.   Electronically Signed   By: Marcello Moores  Register    On: 12/04/2014 12:49   Ct Abdomen Pelvis W Contrast  12/27/2014   CLINICAL DATA:  64 year old female on chemotherapy for metastatic breast cancer. Fever, abdominal pain, foul smelling urine. Initial encounter.  EXAM: CT ABDOMEN AND PELVIS WITH CONTRAST  TECHNIQUE: Multidetector CT imaging of the abdomen and pelvis was performed using the standard protocol following bolus administration of intravenous contrast.  CONTRAST:  171mL OMNIPAQUE IOHEXOL 300 MG/ML  SOLN  COMPARISON:  CT Abdomen and Pelvis 11/10/2014 and earlier  FINDINGS: Lower lobe lung metastases appear mildly progressed since June, now all ranging from 12-18 mm diameter (11-14 mm previously). Superimposed pulmonary atelectasis. No pericardial or pleural effusion.  There is a new mild superior endplate fracture of the L2 vertebral body. No overt metastatic disease identified in that vertebra. No destructive osseous lesion identified elsewhere.  Negative uterus and adnexa. No pelvic free fluid. Decompressed distal colon. There is mild bladder wall thickening and hyper enhancement compared to the prior study (series 2, image 82). No gas within the bladder. There is mild perivesical stranding.  Decompressed sigmoid colon. Negative left colon and distal transverse colon. Retained stool from the cecum to the proximal transverse colon. Negative terminal ileum. Appendix appears to remain normal. No dilated small bowel. Decompressed stomach and duodenum.  Numerous subcutaneous nodules in the ventral abdominal wall, likely related to subcutaneous injections. Progressed hypodense liver lesions, increased in size and number since June. Confluent right lobe involvement. There may be some associated right lobe biliary dilatation, uncertain. Trace perihepatic free fluid. The portal venous system remains patent.  Negative gallbladder, spleen, pancreas and adrenal glands. Renal enhancement and contrast excretion within normal limits. Small retroperitoneal lymph nodes  are stable.  There is evidence of nonocclusive thrombus in both external iliac veins and both common femoral veins, appears more extensive on the left.  IMPRESSION: 1. Progressed pulmonary and hepatic metastatic disease since June. 2. Nonocclusive bilateral pelvic and femoral vein thrombus (nonocclusive DVTs). 3. Mild urinary bladder wall thickening and stranding compatible with urinary tract infection. 4. Acute to subacute L2 compression fracture which could be related to osteoporosis or metastatic disease.   Electronically Signed   By: Genevie Ann M.D.   On: 12/27/2014 16:22   US Abdomen Limited Ruq  12/27/2014   CLINICAL DATA:  Abdominal pain x2 days, elevated LFTs  EXAM: US ABDOMEN LIMITED - RIGHT UPPER QUADRANT  COMPARISON:  CT abdomen pelvis dated 12/27/2014.  FINDINGS: Gallbladder:  Contracted gallbladder. Secondary gallbladder wall thickening. No gallstones or pericholecystic fluid. Negative sonographic Murphy's sign.  Common bile duct:  Diameter: 2 mm.  Liver:  Heterogeneous appearance of the liver. Numerous ill-defined hypoechoic metastases are better visualized on recent CT abdomen pelvis.  IMPRESSION: Contracted gallbladder without associated sonographic findings to suggest acute cholecystitis.  Numerous ill-defined hypoechoic metastases, better evaluated on CT.   Electronically Signed   By: Julian Hy M.D.   On: 12/27/2014 18:02    CBC  Recent Labs Lab 12/27/14 1330 12/28/14 0437 12/29/14 0515 12/30/14 0525  WBC 14.0* 21.7* 22.2* 16.9*  HGB 11.5* 10.6* 9.9* 9.4*  HCT 36.0 34.0* 31.3* 29.6*  PLT 160 133* 121* 132*  MCV 101.7* 103.0* 101.6* 101.0*  MCH 32.5 32.1 32.1 32.1  MCHC 31.9 31.2 31.6 31.8  RDW  18.3* 18.6* 18.2* 17.9*  LYMPHSABS  --  0.7  --   --   MONOABS  --  1.9*  --   --   EOSABS  --  0.0  --   --   BASOSABS  --  0.0  --   --     Chemistries   Recent Labs Lab 12/27/14 1330 12/27/14 1708 12/28/14 0437 12/29/14 0515 12/30/14 0525  NA 136  --  140 137 143    K 4.0  --  3.7 3.3* 3.7  CL 101  --  111 111 114*  CO2 27  --  21* 22 21*  GLUCOSE 102*  --  76 168* 104*  BUN 10  --  6 7 7   CREATININE 0.53  --  0.41* 0.46 0.38*  CALCIUM 8.6*  --  7.9* 7.7* 8.2*  MG  --  1.8  --   --   --   AST 113*  --  103* 98* 71*  ALT 100*  --  89* 83* 74*  ALKPHOS 622*  --  503* 416* 418*  BILITOT 1.0  --  1.3* 0.5 0.4   ------------------------------------------------------------------------------------------------------------------ estimated creatinine clearance is 70.3 mL/min (by C-G formula based on Cr of 0.38). ------------------------------------------------------------------------------------------------------------------ No results for input(s): HGBA1C in the last 72 hours. ------------------------------------------------------------------------------------------------------------------ No results for input(s): CHOL, HDL, LDLCALC, TRIG, CHOLHDL, LDLDIRECT in the last 72 hours. ------------------------------------------------------------------------------------------------------------------ No results for input(s): TSH, T4TOTAL, T3FREE, THYROIDAB in the last 72 hours.  Invalid input(s): FREET3 ------------------------------------------------------------------------------------------------------------------ No results for input(s): VITAMINB12, FOLATE, FERRITIN, TIBC, IRON, RETICCTPCT in the last 72 hours.  Coagulation profile No results for input(s): INR, PROTIME in the last 168 hours.  No results for input(s): DDIMER in the last 72 hours.  Cardiac Enzymes No results for input(s): CKMB, TROPONINI, MYOGLOBIN in the last 168 hours.  Invalid input(s): CK ------------------------------------------------------------------------------------------------------------------ Invalid input(s): POCBNP  JI just wanted to make sure you've did not have any other questions or underlying and I thought I had Fanny Skates, MD [facility] (P)  [phone]

## 2014-12-30 NOTE — Progress Notes (Signed)
ANTICOAGULATION CONSULT NOTE - Follow Up Consult  Pharmacy Consult for Lovenox Indication: VTE treatment  Allergies  Allergen Reactions  . Codeine Nausea And Vomiting    "violently ill"  . Penicillins Rash    Childhood reaction -    Patient Measurements: Height: 5\' 4"  (162.6 cm) Weight: 151 lb 10.8 oz (68.8 kg) IBW/kg (Calculated) : 54.7  Vital Signs: Temp: 98.6 F (37 C) (08/17 0539) Temp Source: Oral (08/17 0539) BP: 120/72 mmHg (08/17 0539) Pulse Rate: 80 (08/17 0539)  Labs:  Recent Labs  12/28/14 0437 12/29/14 0515 12/30/14 0525  HGB 10.6* 9.9* 9.4*  HCT 34.0* 31.3* 29.6*  PLT 133* 121* 132*  CREATININE 0.41* 0.46 0.38*    Estimated Creatinine Clearance: 70.3 mL/min (by C-G formula based on Cr of 0.38).   Medications:  Scheduled:  . acidophilus  1 capsule Oral Daily  . acyclovir  400 mg Oral BID  . antiseptic oral rinse  7 mL Mouth Rinse q12n4p  . cefTAZidime (FORTAZ)  IV  1 g Intravenous 3 times per day  . chlorhexidine  15 mL Mouth Rinse BID  . cholecalciferol  1,000 Units Oral Daily  . dexamethasone  2 mg Oral Daily  . enoxaparin (LOVENOX) injection  1.5 mg/kg Subcutaneous QPC supper  . fluconazole  100 mg Oral Daily  . gabapentin  100 mg Oral TID  . levETIRAcetam  1,000 mg Oral BID  . methylphenidate  5 mg Oral BID WC  . mirtazapine  15 mg Oral QHS  . multivitamin with minerals  1 tablet Oral Daily  . pantoprazole  40 mg Oral BID AC  . sodium chloride  1,000 mL Intravenous Once  . sodium chloride  3 mL Intravenous Q12H  . vancomycin  125 mg Oral QID  . vancomycin  750 mg Intravenous Q12H    Assessment: 18 yoF with history of metastatic breast cancer, recent hospitalization for C.difficile, GERD, and LE DVT on lovenox presents with abdominal pain and weakness for 2 days and admitted to SDU for sepsis possibly 2/2 UTI. Pharmacy consulted to resume lovenox dosing inpatient.    Home dose lovenox = 95mg  (~1.5mg /kg/day), last dose 8/13 @  1200.  Hgb sl lower from yesterday but not critical; Plt stable  CrCl wnl  Goal of Therapy:  Anti-Xa level 0.6-1 units/ml 4hrs after LMWH dose given Monitor platelets by anticoagulation protocol: Yes  Plan:   Continue Lovenox 1.5mg /kg/day = 100mg  (sl higher than home dose due to increase in weight)  F/u CBC, renal fxn, s/sxs bleedings.   Reuel Boom, PharmD, BCPS Pager: [phone] 12/30/2014, 9:46 AM

## 2014-12-30 NOTE — Progress Notes (Signed)
ANTIBIOTIC CONSULT NOTE - FOLLOW UP  Pharmacy Consult for vancomycin, ceftazidime Indication: sepsis 2/2 UTI  Allergies  Allergen Reactions  . Codeine Nausea And Vomiting    "violently ill"  . Penicillins Rash    Childhood reaction -    Patient Measurements: Height: 5\' 4"  (162.6 cm) Weight: 151 lb 10.8 oz (68.8 kg) IBW/kg (Calculated) : 54.7  Vital Signs: Temp: 98.6 F (37 C) (08/17 0539) Temp Source: Oral (08/17 0539) BP: 120/72 mmHg (08/17 0539) Pulse Rate: 80 (08/17 0539) Intake/Output from previous day: 08/16 0701 - 08/17 0700 In: 3559.6 [P.O.:120; I.V.:2989.6; IV Piggyback:450] Out: 900 [Urine:900] Intake/Output from this shift: Total I/O In: -  Out: 1600 [Urine:1600]  Labs:  Recent Labs  12/28/14 0437 12/29/14 0515 12/30/14 0525  WBC 21.7* 22.2* 16.9*  HGB 10.6* 9.9* 9.4*  PLT 133* 121* 132*  CREATININE 0.41* 0.46 0.38*   Estimated Creatinine Clearance: 70.3 mL/min (by C-G formula based on Cr of 0.38). No results for input(s): VANCOTROUGH, VANCOPEAK, VANCORANDOM, GENTTROUGH, GENTPEAK, GENTRANDOM, TOBRATROUGH, TOBRAPEAK, TOBRARND, AMIKACINPEAK, AMIKACINTROU, AMIKACIN in the last 72 hours.   Microbiology: Recent Results (from the past 720 hour(s))  TECHNOLOGIST REVIEW     Status: None   Collection Time: 12/23/14  9:40 AM  Result Value Ref Range Status   Technologist Review Metas and Myelocytes present, rare Nrbc  Final  Blood Culture (routine x 2)     Status: None (Preliminary result)   Collection Time: 12/27/14  1:30 PM  Result Value Ref Range Status   Specimen Description PORTA CATH  Final   Special Requests BOTTLES DRAWN AEROBIC AND ANAEROBIC [place]  Final   Culture   Final    NO GROWTH 2 DAYS Performed at [facility]    Report Status PENDING  Incomplete  Urine culture     Status: None   Collection Time: 12/27/14  4:45 PM  Result Value Ref Range Status   Specimen Description URINE, CATHETERIZED  Final   Special Requests NONE  Final   Culture   Final    >=100,000 COLONIES/mL ESCHERICHIA COLI Performed at [facility]    Report Status 12/30/2014 FINAL  Final  MRSA PCR Screening     Status: None   Collection Time: 12/27/14  6:40 PM  Result Value Ref Range Status   MRSA by PCR NEGATIVE NEGATIVE Final    Comment:        The GeneXpert MRSA Assay (FDA approved for NASAL specimens only), is one component of a comprehensive MRSA colonization surveillance program. It is not intended to diagnose MRSA infection nor to guide or monitor treatment for MRSA infections.   C difficile quick scan w PCR reflex     Status: Abnormal   Collection Time: 12/29/14 12:21 PM  Result Value Ref Range Status   C Diff antigen POSITIVE (A) NEGATIVE Final    Comment: CRITICAL RESULT CALLED TO, READ BACK BY AND VERIFIED WITH: [place] [id number] @ [id number] BY J SCOTTON    C Diff toxin NEGATIVE NEGATIVE Final   C Diff interpretation   Final    C. difficile present, but toxin not detected. This indicates colonization. In most cases, this does not require treatment. If patient has signs and symptoms consistent with colitis, consider treatment.    Anti-infectives    Start     Dose/Rate Route Frequency Ordered Stop   12/28/14 1100  vancomycin (VANCOCIN) 50 mg/mL oral solution 125 mg     125 mg Oral 4 times daily 12/28/14 1007  12/28/14 0600  vancomycin (VANCOCIN) IVPB 750 mg/150 ml premix     750 mg 150 mL/hr over 60 Minutes Intravenous Every 12 hours 12/27/14 1430     12/27/14 2200  cefTAZidime (FORTAZ) 1 g in dextrose 5 % 50 mL IVPB     1 g 100 mL/hr over 30 Minutes Intravenous 3 times per day 12/27/14 1430     12/27/14 2200  acyclovir (ZOVIRAX) tablet 400 mg     400 mg Oral 2 times daily 12/27/14 1844     12/27/14 1845  fluconazole (DIFLUCAN) tablet 100 mg     100 mg Oral Daily 12/27/14 1844     12/27/14 1345  vancomycin (VANCOCIN) 1,500 mg in sodium chloride 0.9 % 500 mL IVPB     1,500 mg 250 mL/hr over 120 Minutes  Intravenous  Once 12/27/14 1341 12/27/14 1727   12/27/14 1345  cefTAZidime (FORTAZ) 2 g in dextrose 5 % 50 mL IVPB     2 g 100 mL/hr over 30 Minutes Intravenous NOW 12/27/14 1342 12/27/14 1448   12/27/14 1330  vancomycin (VANCOCIN) 1,500 mg in sodium chloride 0.9 % 500 mL IVPB  Status:  Discontinued     1,500 mg 250 mL/hr over 120 Minutes Intravenous  Once 12/27/14 1320 12/27/14 1324      Assessment: 76 yoF with hx recent hospitalization in June '16 for clostridium difficile colitis and also metastatic breast cancer on chemotherapy and seizure prophylaxis presents with abdominal pain and multiple bruises on bilateral upper extremities. Chemotherapy due on 8/10 held for elevated LFTs and alk phos and concern for possible recurrent clostridium difficile infection. Pharmacy consulted to start vancomycin and ceftazidime for sepsis. Noted allergy to penicillins (rash) but pt has tolerated cephalosporins in the past.   Temp: afebrile since 8/16 WBC: elevated but trending down (steroids) Renal: SCr low but stable; CrCl 70 C  8/14 >> Vancomycin  >> 8/14 >> Ceftazidime >>  8/14 >> Acyclovir po as PTA >> 8/14 >> Fluconazole po >> 8/15 >> Po Vanc >>   8/14 blood x2: NGTD 8/14 urine: >100K Ecoli (ID pending) 8/14: MRSA PCR negative 8/17 Cdiff Ag+; tox - (colonization)   Goal of Therapy:  Vancomycin trough level 15-20 mcg/ml  Eradication of infection Appropriate antibiotic dosing for indication and renal function  Plan:  Day 4 antibiotics Continue ceftazidime 1 g IV q8 hr Continue vancomycin 750 mg IV q12 hr Check trough before 7th dose today if not discontinued, doubt this patient needs vancomycin based on culture results and improved clinical status  Follow clinical course, renal function, culture results as available  Follow for de-escalation of antibiotics and LOT   Reuel Boom, PharmD, BCPS Pager: [phone] 12/30/2014, 9:34 AM

## 2014-12-30 NOTE — Progress Notes (Signed)
Patient was originally scheduled for out patient MRI tomorrow @  1500. RN notifying the PCP if patient needs a new order.  Will continue to monitor.

## 2014-12-30 NOTE — Telephone Encounter (Signed)
I hade sent dr [name] a message about a follow up for this patient after her scans but she is in the hospital per dr [name] and we will arrange a visit at a later time,appointment cancelled for [facility] 8/19

## 2014-12-30 NOTE — Progress Notes (Signed)
Returned Carrie Berry's phone call in reference to itemized statements form 02/2014-12/2014. Advised that statements are ready for pick up at the front desk of [place]. Also explained that [facility] in [place] could provide the bills on the Radiation side.

## 2014-12-31 ENCOUNTER — Ambulatory Visit (HOSPITAL_COMMUNITY): Admission: RE | Admit: 2014-12-31 | Payer: BC Managed Care – PPO | Source: Ambulatory Visit

## 2014-12-31 DIAGNOSIS — C7800 Secondary malignant neoplasm of unspecified lung: Secondary | ICD-10-CM

## 2014-12-31 DIAGNOSIS — R627 Adult failure to thrive: Secondary | ICD-10-CM

## 2014-12-31 LAB — CBC WITH DIFFERENTIAL/PLATELET
BASOS ABS: 0 10*3/uL (ref 0.0–0.1)
BASOS PCT: 0 % (ref 0–1)
EOS ABS: 0 10*3/uL (ref 0.0–0.7)
Eosinophils Relative: 0 % (ref 0–5)
HCT: 30.9 % — ABNORMAL LOW (ref 36.0–46.0)
HEMOGLOBIN: 10 g/dL — AB (ref 12.0–15.0)
Lymphocytes Relative: 6 % — ABNORMAL LOW (ref 12–46)
Lymphs Abs: 0.9 10*3/uL (ref 0.7–4.0)
MCH: 33.2 pg (ref 26.0–34.0)
MCHC: 32.4 g/dL (ref 30.0–36.0)
MCV: 102.7 fL — ABNORMAL HIGH (ref 78.0–100.0)
Monocytes Absolute: 1.3 10*3/uL — ABNORMAL HIGH (ref 0.1–1.0)
Monocytes Relative: 10 % (ref 3–12)
NEUTROS PCT: 84 % — AB (ref 43–77)
Neutro Abs: 11.2 10*3/uL — ABNORMAL HIGH (ref 1.7–7.7)
Platelets: 136 10*3/uL — ABNORMAL LOW (ref 150–400)
RBC: 3.01 MIL/uL — AB (ref 3.87–5.11)
RDW: 18.3 % — ABNORMAL HIGH (ref 11.5–15.5)
WBC: 13.4 10*3/uL — AB (ref 4.0–10.5)

## 2014-12-31 LAB — URINE CULTURE: Culture: >=100000

## 2014-12-31 LAB — BASIC METABOLIC PANEL
Anion gap: 6 (ref 5–15)
BUN: 10 mg/dL (ref 6–20)
CHLORIDE: 111 mmol/L (ref 101–111)
CO2: 25 mmol/L (ref 22–32)
Calcium: 8.5 mg/dL — ABNORMAL LOW (ref 8.9–10.3)
Creatinine, Ser: 0.48 mg/dL (ref 0.44–1.00)
Glucose, Bld: 105 mg/dL — ABNORMAL HIGH (ref 65–99)
POTASSIUM: 3.2 mmol/L — AB (ref 3.5–5.1)
SODIUM: 142 mmol/L (ref 135–145)

## 2014-12-31 MED ORDER — ALTEPLASE 2 MG IJ SOLR
2.0000 mg | Freq: Once | INTRAMUSCULAR | Status: AC
  Administered 2014-12-31: 2 mg
  Filled 2014-12-31 (×2): qty 2

## 2014-12-31 MED ORDER — POTASSIUM CHLORIDE CRYS ER 20 MEQ PO TBCR
40.0000 meq | EXTENDED_RELEASE_TABLET | Freq: Every day | ORAL | Status: DC
  Administered 2014-12-31 – 2015-01-01 (×2): 40 meq via ORAL
  Filled 2014-12-31 (×2): qty 2

## 2014-12-31 MED ORDER — HYDROMORPHONE HCL 2 MG PO TABS
6.0000 mg | ORAL_TABLET | ORAL | Status: DC | PRN
  Administered 2014-12-31 – 2015-01-01 (×4): 6 mg via ORAL
  Filled 2014-12-31 (×4): qty 3

## 2014-12-31 MED ORDER — SODIUM CHLORIDE 0.9 % IJ SOLN
10.0000 mL | INTRAMUSCULAR | Status: DC | PRN
Start: 2014-12-31 — End: 2015-01-01
  Administered 2014-12-31: 20 mL
  Administered 2015-01-01: 10 mL
  Filled 2014-12-31 (×2): qty 40

## 2014-12-31 MED ORDER — SULFAMETHOXAZOLE-TRIMETHOPRIM 800-160 MG PO TABS
1.0000 | ORAL_TABLET | Freq: Two times a day (BID) | ORAL | Status: DC
  Administered 2014-12-31 – 2015-01-01 (×3): 1 via ORAL
  Filled 2014-12-31 (×3): qty 1

## 2014-12-31 NOTE — Progress Notes (Signed)
Patient is requesting Trazodone for sleep. PCP on call was paged.

## 2014-12-31 NOTE — Care Management Note (Signed)
Case Management Note  Patient Details  Name: Carrie Berry MRN: [id number] Date of Birth: [birth year]  Subjective/Objective:  [facility] chosen for HHPT. TC [facility] rep Kristen aware of [place] orders. Await d/c order.                  Action/Plan:dj/c home w/HHC.   Expected Discharge Date:   (unknown)               Expected Discharge Plan:  [place]  In-House Referral:  NA  Discharge planning Services  CM Consult  Post Acute Care Choice:  NA Choice offered to:  NA, Patient  DME Arranged:    DME Agency:     HH Arranged:  PT [place]:  [place]  Status of Service:  In process, will continue to follow  Medicare Important Message Given:    Date Medicare IM Given:    Medicare IM give by:    Date Additional Medicare IM Given:    Additional Medicare Important Message give by:     If discussed at [place] of Stay Meetings, dates discussed:    Additional Comments:  Dessa Phi, RN 12/31/2014, 1:09 PM

## 2014-12-31 NOTE — Progress Notes (Signed)
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  sepsis secondary to possible UTI. inititally on broad-spectrum antibiotics vancomycin and Fortaz.  Narrowed ebentually  Assessment & Plan   Sepsis secondary to possible UTI/Cdiff -Upon admission, patient was febrile with tachycardia as well as leukocytosis. She was mildly hypotensive.  -Patient recently had episode of C. difficile in June and July 2016 and was on vancomycin by mouth -Currently on broad-spectrum anabiotic with vancomycin and Fortaz-->PO Bactrim to complete 10 days pyelo Rx on 8.23.16 -Spoke with infectious disease, who recommended continuing vancomycin PO for possibility of C. difficile recurrence-needs 2 week overlap on Vancomycin ending on 01/17/15 -Continue to monitor CBC -UC=E.coli  Metastatic breast cancer with poor response to chemo -Metastasis to brain, spine, lung, liver -Follows with Dr. [name], oncology -Patial code status  Chronic systolic heart failure -Echocardiogram 06/26/2014, EF 40-45% -Monitor daily weights, intake/output + 6 L so far so hold IV fluid/cut back rate to 75 cc an hour  Mild hypokalemia Replace K of 3.2 with 20 meq Kdur  GERD -Continue PPI  Severe abdominal pain -CT showed no signs of inflammation or bowel obstruction -Possibly secondary to L2 # with radiation -Continue PPI and pain control -Lipase was normal, LFTs trending downward -Abdominal ultrasound: Showed contracted gallbladder however no cholecystitis  History of DVT -Continue full dose Lovenox lifelong given h/o Cancer  Prolonged QTC -Likely secondary to trazodone, Remeron, Compazine, cetrizine (medications held) -Magnesium 1.8  Failure to thrive -PT and nutrition consulted  Recent history of C. difficile colitis -Plan as  above  Goals of care -Continue to treat.  Not ready for palliative care consult. Currently partial code.  Code Status: Partial code, no CPR, no intubation  Family Communication: Discussed with relative at the bedside and explained completely plan of care  Disposition Plan: Admitted. Patient's vital signs have stabilized.--Likely d/c home with home health PT/OT in am  Time Spent in minutes   30 minutes  Procedures  None  Consults   Dr. [name], oncology  DVT Prophylaxis  Lovenox  Lab Results  Component Value Date   PLT 136* 12/31/2014    Medications  Scheduled Meds: . acidophilus  1 capsule Oral Daily  . acyclovir  400 mg Oral BID  . antiseptic oral rinse  7 mL Mouth Rinse q12n4p  . chlorhexidine  15 mL Mouth Rinse BID  . cholecalciferol  1,000 Units Oral Daily  . dexamethasone  2 mg Oral Daily  . enoxaparin (LOVENOX) injection  1.5 mg/kg Subcutaneous QPC supper  . fluconazole  100 mg Oral Daily  . gabapentin  100 mg Oral TID  . levETIRAcetam  1,000 mg Oral BID  . methylphenidate  5 mg Oral BID WC  . mirtazapine  15 mg Oral QHS  . multivitamin with minerals  1 tablet Oral Daily  . pantoprazole  40 mg Oral BID AC  . sodium chloride  1,000 mL Intravenous Once  . sodium chloride  3 mL Intravenous  Q12H  . vancomycin  125 mg Oral QID   Continuous Infusions: . sodium chloride 75 mL/hr at 12/31/14 0316   PRN Meds:.acetaminophen **OR** acetaminophen, HYDROmorphone, ondansetron **OR** ondansetron (ZOFRAN) IV, phenol, sodium chloride  Antibiotics     Subjective:   Better IV not working tol po well without n/v no cp No sob No fever nor chills Some loose stool  walked with therapy this am    Objective:   Filed Vitals:   12/30/14 0539 12/30/14 1309 12/30/14 2145 12/31/14 0558  BP: 120/72 115/64 122/69 129/72  Pulse: 80 81 71 69  Temp: 98.6 F (37 C) 98.2 F (36.8 C) 98.5 F (36.9 C) 98.1 F (36.7 C)  TempSrc: Oral Oral Oral Oral  Resp:  18 16 16     Height:      Weight:      SpO2: 99% 98% 97% 99%    Wt Readings from Last 3 Encounters:  12/28/14 68.8 kg (151 lb 10.8 oz)  11/25/14 64.547 kg (142 lb 4.8 oz)  11/10/14 64.2 kg (141 lb 8.6 oz)     Intake/Output Summary (Last 24 hours) at 12/31/14 1024 Last data filed at 12/31/14 7494  Gross per 24 hour  Intake 1446.25 ml  Output    300 ml  Net 1146.25 ml    Exam  General: Well developed, well nourished, no distress  HEENT: NCAT, mucous membranes moist.   Cardiovascular: S1 S2 auscultated, RRR  Respiratory: Clear to auscultation   Abdomen: Soft, mild RUQ and Epigastric TTP--tip of liver felt in midline,  Mildly distended, + bowel sounds  Data Review   Micro Results Recent Results (from the past 240 hour(s))  TECHNOLOGIST REVIEW     Status: None   Collection Time: 12/23/14  9:40 AM  Result Value Ref Range Status   Technologist Review Metas and Myelocytes present, rare Nrbc  Final  Blood Culture (routine x 2)     Status: None (Preliminary result)   Collection Time: 12/27/14  1:30 PM  Result Value Ref Range Status   Specimen Description PORTA CATH  Final   Special Requests BOTTLES DRAWN AEROBIC AND ANAEROBIC [place]  Final   Culture   Final    NO GROWTH 3 DAYS Performed at [facility]    Report Status PENDING  Incomplete  Urine culture     Status: None   Collection Time: 12/27/14  4:45 PM  Result Value Ref Range Status   Specimen Description URINE, CATHETERIZED  Final   Special Requests NONE  Final   Culture   Final    >=100,000 COLONIES/mL ESCHERICHIA COLI Performed at [facility]    Report Status 12/31/2014 FINAL  Final   Organism ID, Bacteria ESCHERICHIA COLI  Final      Susceptibility   Escherichia coli - MIC*    AMPICILLIN >=32 RESISTANT Resistant     CEFAZOLIN <=4 SENSITIVE Sensitive     CEFTRIAXONE <=1 SENSITIVE Sensitive     CIPROFLOXACIN <=0.25 SENSITIVE Sensitive     GENTAMICIN <=1 SENSITIVE Sensitive     IMIPENEM <=0.25 SENSITIVE  Sensitive     NITROFURANTOIN 128 RESISTANT Resistant     TRIMETH/SULFA <=20 SENSITIVE Sensitive     AMPICILLIN/SULBACTAM >=32 RESISTANT Resistant     PIP/TAZO <=4 SENSITIVE Sensitive     * >=100,000 COLONIES/mL ESCHERICHIA COLI  MRSA PCR Screening     Status: None   Collection Time: 12/27/14  6:40 PM  Result Value Ref Range Status   MRSA by PCR NEGATIVE  NEGATIVE Final    Comment:        The GeneXpert MRSA Assay (FDA approved for NASAL specimens only), is one component of a comprehensive MRSA colonization surveillance program. It is not intended to diagnose MRSA infection nor to guide or monitor treatment for MRSA infections.   C difficile quick scan w PCR reflex     Status: Abnormal   Collection Time: 12/29/14 12:21 PM  Result Value Ref Range Status   C Diff antigen POSITIVE (A) NEGATIVE Final    Comment: CRITICAL RESULT CALLED TO, READ BACK BY AND VERIFIED WITH: [place] [id number] @ 6073 BY J SCOTTON    C Diff toxin NEGATIVE NEGATIVE Final   C Diff interpretation   Final    C. difficile present, but toxin not detected. This indicates colonization. In most cases, this does not require treatment. If patient has signs and symptoms consistent with colitis, consider treatment.    Radiology Reports Dg Chest 2 View  12/27/2014   CLINICAL DATA:  Shortness of breath.  EXAM: CHEST  2 VIEW  COMPARISON:  November 10, 2014.  FINDINGS: Stable cardiomegaly. Right internal jugular Port-A-Cath is unchanged in position with distal tip overlying expected position of the SVC. No pneumothorax or pleural effusion is noted. Two nodules are noted in the left upper lobe concerning for metastatic disease. Stable blunting of left costophrenic sulcus is noted consistent with small effusion or scarring. Small nodule is noted in right upper lobe.  IMPRESSION: Bilateral pulmonary nodules are noted concerning for metastatic disease. Stable blunting of left costophrenic sulcus consistent with scarring or small  pleural effusion.   Electronically Signed   By: Marijo Conception, M.D.   On: 12/27/2014 14:25   Nm Bone Scan Whole Body  12/04/2014   CLINICAL DATA:  Breast cancer.  EXAM: NUCLEAR MEDICINE WHOLE BODY BONE SCAN  TECHNIQUE: Whole body anterior and posterior images were obtained approximately 3 hours after intravenous injection of radiopharmaceutical.  RADIOPHARMACEUTICALS:  27.4 mCi Technetium-43m MDP IV  COMPARISON:  CT 11/10/2014 .  PET-CT 01/07/2013.  FINDINGS: Bilateral renal function and excretion is noted. Increased activity noted over the L1 area. Further evaluation of the lumbar spine to evaluate for compression fracture or metastatic disease can be performed with MRI. No abnormality noted in this region on prior CT of 11/10/2014. No other abnormal bony lesions are noted.  IMPRESSION: Increased activity noted over the L1 region. This is suspicious for active process such as compression fracture or metastatic disease. No abnormality noted in this region on prior CT of 11/10/2014. Gadolinium-enhanced MRI of the lumbar spine can be obtained to further evaluate.   Electronically Signed   By: Marcello Moores  Register   On: 12/04/2014 12:49   Ct Abdomen Pelvis W Contrast  12/27/2014   CLINICAL DATA:  64 year old female on chemotherapy for metastatic breast cancer. Fever, abdominal pain, foul smelling urine. Initial encounter.  EXAM: CT ABDOMEN AND PELVIS WITH CONTRAST  TECHNIQUE: Multidetector CT imaging of the abdomen and pelvis was performed using the standard protocol following bolus administration of intravenous contrast.  CONTRAST:  122mL OMNIPAQUE IOHEXOL 300 MG/ML  SOLN  COMPARISON:  CT Abdomen and Pelvis 11/10/2014 and earlier  FINDINGS: Lower lobe lung metastases appear mildly progressed since June, now all ranging from 12-18 mm diameter (11-14 mm previously). Superimposed pulmonary atelectasis. No pericardial or pleural effusion.  There is a new mild superior endplate fracture of the L2 vertebral body. No  overt metastatic disease identified in that vertebra. No destructive  osseous lesion identified elsewhere.  Negative uterus and adnexa. No pelvic free fluid. Decompressed distal colon. There is mild bladder wall thickening and hyper enhancement compared to the prior study (series 2, image 82). No gas within the bladder. There is mild perivesical stranding.  Decompressed sigmoid colon. Negative left colon and distal transverse colon. Retained stool from the cecum to the proximal transverse colon. Negative terminal ileum. Appendix appears to remain normal. No dilated small bowel. Decompressed stomach and duodenum.  Numerous subcutaneous nodules in the ventral abdominal wall, likely related to subcutaneous injections. Progressed hypodense liver lesions, increased in size and number since June. Confluent right lobe involvement. There may be some associated right lobe biliary dilatation, uncertain. Trace perihepatic free fluid. The portal venous system remains patent.  Negative gallbladder, spleen, pancreas and adrenal glands. Renal enhancement and contrast excretion within normal limits. Small retroperitoneal lymph nodes are stable.  There is evidence of nonocclusive thrombus in both external iliac veins and both common femoral veins, appears more extensive on the left.  IMPRESSION: 1. Progressed pulmonary and hepatic metastatic disease since June. 2. Nonocclusive bilateral pelvic and femoral vein thrombus (nonocclusive DVTs). 3. Mild urinary bladder wall thickening and stranding compatible with urinary tract infection. 4. Acute to subacute L2 compression fracture which could be related to osteoporosis or metastatic disease.   Electronically Signed   By: Genevie Ann M.D.   On: 12/27/2014 16:22   US Abdomen Limited Ruq  12/27/2014   CLINICAL DATA:  Abdominal pain x2 days, elevated LFTs  EXAM: US ABDOMEN LIMITED - RIGHT UPPER QUADRANT  COMPARISON:  CT abdomen pelvis dated 12/27/2014.  FINDINGS: Gallbladder:  Contracted  gallbladder. Secondary gallbladder wall thickening. No gallstones or pericholecystic fluid. Negative sonographic Murphy's sign.  Common bile duct:  Diameter: 2 mm.  Liver:  Heterogeneous appearance of the liver. Numerous ill-defined hypoechoic metastases are better visualized on recent CT abdomen pelvis.  IMPRESSION: Contracted gallbladder without associated sonographic findings to suggest acute cholecystitis.  Numerous ill-defined hypoechoic metastases, better evaluated on CT.   Electronically Signed   By: Julian Hy M.D.   On: 12/27/2014 18:02    CBC  Recent Labs Lab 12/27/14 1330 12/28/14 0437 12/29/14 0515 12/30/14 0525 12/31/14 0940  WBC 14.0* 21.7* 22.2* 16.9* 13.4*  HGB 11.5* 10.6* 9.9* 9.4* 10.0*  HCT 36.0 34.0* 31.3* 29.6* 30.9*  PLT 160 133* 121* 132* 136*  MCV 101.7* 103.0* 101.6* 101.0* 102.7*  MCH 32.5 32.1 32.1 32.1 33.2  MCHC 31.9 31.2 31.6 31.8 32.4  RDW 18.3* 18.6* 18.2* 17.9* 18.3*  LYMPHSABS  --  0.7  --   --  0.9  MONOABS  --  1.9*  --   --  1.3*  EOSABS  --  0.0  --   --  0.0  BASOSABS  --  0.0  --   --  0.0    Chemistries   Recent Labs Lab 12/27/14 1330 12/27/14 1708 12/28/14 0437 12/29/14 0515 12/30/14 0525  NA 136  --  140 137 143  K 4.0  --  3.7 3.3* 3.7  CL 101  --  111 111 114*  CO2 27  --  21* 22 21*  GLUCOSE 102*  --  76 168* 104*  BUN 10  --  6 7 7   CREATININE 0.53  --  0.41* 0.46 0.38*  CALCIUM 8.6*  --  7.9* 7.7* 8.2*  MG  --  1.8  --   --   --   AST 113*  --  103* 98* 71*  ALT 100*  --  89* 83* 74*  ALKPHOS 622*  --  503* 416* 418*  BILITOT 1.0  --  1.3* 0.5 0.4   ------------------------------------------------------------------------------------------------------------------ estimated creatinine clearance is 70.3 mL/min (by C-G formula based on Cr of 0.38). ------------------------------------------------------------------------------------------------------------------ No results for input(s): HGBA1C in the last 72  hours. ------------------------------------------------------------------------------------------------------------------ No results for input(s): CHOL, HDL, LDLCALC, TRIG, CHOLHDL, LDLDIRECT in the last 72 hours. ------------------------------------------------------------------------------------------------------------------ No results for input(s): TSH, T4TOTAL, T3FREE, THYROIDAB in the last 72 hours.  Invalid input(s): FREET3 ------------------------------------------------------------------------------------------------------------------ No results for input(s): VITAMINB12, FOLATE, FERRITIN, TIBC, IRON, RETICCTPCT in the last 72 hours.  Coagulation profile No results for input(s): INR, PROTIME in the last 168 hours.  No results for input(s): DDIMER in the last 72 hours.  Cardiac Enzymes No results for input(s): CKMB, TROPONINI, MYOGLOBIN in the last 168 hours.  Invalid input(s): CK ------------------------------------------------------------------------------------------------------------------ Invalid input(s): POCBNP  Verneita Griffes, MD [facility] [phone]

## 2014-12-31 NOTE — Progress Notes (Signed)
Physical Therapy Treatment Patient Details Name: Carrie Berry MRN: [id number] DOB: 10/08/1950 Today's Date: 12/31/2014    History of Present Illness Pt admitted through ED with R side abdominal pain.  Pt with hx Breast CA with mets to liver and brain.    PT Comments    Pt feeling a little better.  Assisted OOB to [facility] then amb a limited distance due to MAX c/o fatigue and R LQ ABD pain.  Reported to RN.   Pt plans to D/C back home with spouse.   Follow Up Recommendations  Home health PT     Equipment Recommendations  None recommended by PT    Recommendations for Other Services       Precautions / Restrictions Precautions Precautions: Fall Precaution Comments: FRAGILE SKIN Restrictions Weight Bearing Restrictions: No    Mobility  Bed Mobility Overal bed mobility: Needs Assistance;+2 for physical assistance;+ 2 for safety/equipment Bed Mobility: Supine to Sit     Supine to sit: Mod assist;+2 for physical assistance;+2 for safety/equipment     General bed mobility comments: cues for sequence and assist to log roll to L, manage LEs over EOB and bring trunk to upright.   Transfers Overall transfer level: Needs assistance Equipment used: Rolling walker (2 wheeled) Transfers: Sit to/from Stand           General transfer comment: 25% VC's on proper tech and hand placement esp with stand to sit and turn completion to [facility].    Ambulation/Gait Ambulation/Gait assistance: Min assist;+2 physical assistance Ambulation Distance (Feet): 8 Feet Assistive device: Rolling walker (2 wheeled) Gait Pattern/deviations: Step-to pattern;Decreased step length - left;Decreased stance time - right;Shuffle;Trunk flexed Gait velocity: decreased   General Gait Details: limited amb distance due to c/o R LQ ABD pain and c/o head congestion.  Reported to Dietitian Rankin (Stroke Patients Only)       Balance                                     Cognition Arousal/Alertness: Awake/alert Behavior During Therapy: WFL for tasks assessed/performed Overall Cognitive Status: Within Functional Limits for tasks assessed                      Exercises      General Comments        Pertinent Vitals/Pain Pain Assessment: 0-10 Pain Score: 7  Pain Location: ABD "liver" R LQ Pain Descriptors / Indicators: Cramping Pain Intervention(s): Repositioned;Limited activity within patient's tolerance;Patient requesting pain meds-RN notified    Home Living                      Prior Function            PT Goals (current goals can now be found in the care plan section) Progress towards PT goals: Progressing toward goals    Frequency  Min 3X/week    PT Plan      Co-evaluation             End of Session Equipment Utilized During Treatment: Gait belt Activity Tolerance: Patient limited by fatigue Patient left: in chair;with call bell/phone within reach     Time: 0942-1008 PT Time Calculation (min) (ACUTE ONLY): 26 min  Charges:  $Gait Training: 8-22 mins $Therapeutic  Activity: 8-22 mins                    G Codes:      Rica Koyanagi  PTA [facility]  Acute  Rehab Pager      [phone]

## 2014-12-31 NOTE — Care Management Note (Signed)
Case Management Note  Patient Details  Name: Carrie Berry MRN: [id number] Date of Birth: Jun 10, 1950  Subjective/Objective:  PT-HH. Provided patient w/HHC agency list. She thinks [facility] came out to visit but not sure.She will confirm w/spouse.Await choice.  HH orders already placed.                Action/Plan:d/c home w/HHC.   Expected Discharge Date:   (unknown)               Expected Discharge Plan:  [place]  In-House Referral:  NA  Discharge planning Services  CM Consult  Post Acute Care Choice:  NA Choice offered to:  NA, Patient  DME Arranged:    DME Agency:     HH Arranged:    [place] Agency:     Status of Service:  In process, will continue to follow  Medicare Important Message Given:    Date Medicare IM Given:    Medicare IM give by:    Date Additional Medicare IM Given:    Additional Medicare Important Message give by:     If discussed at [place] of Stay Meetings, dates discussed:    Additional Comments:  Dessa Phi, RN 12/31/2014, 12:39 PM

## 2014-12-31 NOTE — Progress Notes (Signed)
Nutrition Follow-up  DOCUMENTATION CODES:   Not applicable  INTERVENTION:  - Will order [employer] Breakfast for pt once/day - RD will continue to monitor for needs  NUTRITION DIAGNOSIS:   Inadequate oral intake related to inability to eat as evidenced by NPO status. -resolving with diet advancement and improving appetite   GOAL:   Patient will meet greater than or equal to 90% of their needs -unmet on average  MONITOR:   PO intake, Supplement acceptance, Weight trends, Labs, I & O's  ASSESSMENT:   64 year old female with history of metastatic breast cancer to the brain, lung, liver, metastatic lumbar spine fracture currently on Doxil chemotherapy (held last chemotherapy on 8/10 due to low-grade fever post and, on prolonged treatment for suspected C. difficile (symptoms of abdominal discomfort with cramping for several weeks), GERD, lower extremity DVT on Lovenox, severe protein calorie malnutrition, anxiety, focal motor seizure presented to the ED with severe epigastric pain and generalized weakness. Patient reports that since November of last year she has been having off and on crampy abdominal pain. Since the past 10-12 weeks she has been off and on treated for C. difficile initially with a course of Flagyl followed by 2 week course of vancomycin with persistent abdominal pain symptoms. Her staples were then positive for C. difficile and then was again placed on vancomycin which was stopped. During her last visit 4 days ago she was again restarted on oral vancomycin as her abdominal pain was persistent.  8/18 Pt's diet advanced yesterday AM. Pt reports fair appetite which is beginning to improve. She states that she ate part of bagel with cream cheese and jelly, dry cereal, and juice for breakfast this AM. She states nausea yesterday which is resolved as of this AM. She states she has abdominal pain due to cancer/mets that is intermittently exacerbated by PO intakes. She is  interested in receiving [employer] for breakfast each day.   Not meeting needs currently but beginning to improve intakes. Medications reviewed; Remeron order in place. Labs reviewed; Cl: 114 mmol/L, creatinine low, LFTs elevated but trending dow, Ca: 8.2 mg/dL.  8/15 - NPO status. - RN reports that pt has been taking Remeron for appetite stimulation. - Remeron was prescribed for appetite stimulation as well as control of depression.  - Pt has had a poor appetite for a few months but it has become worse over the past week and she would only eat a few bites at infrequent intervals. - Pt has been experiencing taste alteration but liked coleslaw, [employer], and a few other items that still tasted good to her. Taste alterations fluctuated dependent on treatment cycles.  - She was not having chewing or swallowing difficulty PTA.  Diet Order:  Diet regular Room service appropriate?: Yes; Fluid consistency:: Thin  Skin:  Reviewed, no issues  Last BM:  8/17  Height:   Ht Readings from Last 1 Encounters:  12/27/14 5\' 4"  (1.626 m)    Weight:   Wt Readings from Last 1 Encounters:  12/28/14 151 lb 10.8 oz (68.8 kg)    Ideal Body Weight:  54.54 kg (kg)  BMI:  Body mass index is 26.02 kg/(m^2).  Estimated Nutritional Needs:   Kcal:  2050-2250  Protein:  80-90 grams  Fluid:  2.2-2.5 L/day  EDUCATION NEEDS:   No education needs identified at this time     Jarome Matin, RD, LDN Inpatient Clinical Dietitian Pager # [phone] After hours/weekend pager # [phone]

## 2015-01-01 ENCOUNTER — Ambulatory Visit: Payer: BC Managed Care – PPO | Admitting: Nurse Practitioner

## 2015-01-01 DIAGNOSIS — A047 Enterocolitis due to Clostridium difficile: Secondary | ICD-10-CM

## 2015-01-01 DIAGNOSIS — R1010 Upper abdominal pain, unspecified: Secondary | ICD-10-CM

## 2015-01-01 LAB — CBC
HEMATOCRIT: 35.3 % — AB (ref 36.0–46.0)
Hemoglobin: 11.2 g/dL — ABNORMAL LOW (ref 12.0–15.0)
MCH: 32.5 pg (ref 26.0–34.0)
MCHC: 31.7 g/dL (ref 30.0–36.0)
MCV: 102.3 fL — AB (ref 78.0–100.0)
PLATELETS: 156 10*3/uL (ref 150–400)
RBC: 3.45 MIL/uL — AB (ref 3.87–5.11)
RDW: 18.1 % — ABNORMAL HIGH (ref 11.5–15.5)
WBC: 12.9 10*3/uL — AB (ref 4.0–10.5)

## 2015-01-01 LAB — BASIC METABOLIC PANEL
ANION GAP: 9 (ref 5–15)
BUN: 7 mg/dL (ref 6–20)
CO2: 25 mmol/L (ref 22–32)
Calcium: 8.8 mg/dL — ABNORMAL LOW (ref 8.9–10.3)
Chloride: 107 mmol/L (ref 101–111)
Creatinine, Ser: 0.43 mg/dL — ABNORMAL LOW (ref 0.44–1.00)
GLUCOSE: 72 mg/dL (ref 65–99)
POTASSIUM: 3.8 mmol/L (ref 3.5–5.1)
Sodium: 141 mmol/L (ref 135–145)

## 2015-01-01 LAB — CULTURE, BLOOD (ROUTINE X 2): CULTURE: NO GROWTH

## 2015-01-01 MED ORDER — SULFAMETHOXAZOLE-TRIMETHOPRIM 800-160 MG PO TABS: 1.0000 | ORAL_TABLET | Freq: Two times a day (BID) | ORAL | Status: DC

## 2015-01-01 MED ORDER — HEPARIN SOD (PORK) LOCK FLUSH 100 UNIT/ML IV SOLN
500.0000 [IU] | INTRAVENOUS | Status: DC
  Filled 2015-01-01: qty 5

## 2015-01-01 MED ORDER — HEPARIN SOD (PORK) LOCK FLUSH 100 UNIT/ML IV SOLN
500.0000 [IU] | INTRAVENOUS | Status: DC | PRN
  Administered 2015-01-01: 500 [IU]
  Filled 2015-01-01 (×2): qty 5

## 2015-01-01 MED ORDER — VANCOMYCIN HCL 125 MG PO CAPS: 125.0000 mg | ORAL_CAPSULE | Freq: Four times a day (QID) | ORAL | Status: DC

## 2015-01-01 NOTE — Care Management Note (Signed)
Case Management Note  Patient Details  Name: KITTI MCCLISH MRN: [id number] Date of Birth: Aug 11, 1950  Subjective/Objective:                    Action/Plan:d/c home w/HHC.   Expected Discharge Date:   (unknown)               Expected Discharge Plan:  [place]  In-House Referral:  NA  Discharge planning Services  CM Consult  Post Acute Care Choice:  NA Choice offered to:  Patient  DME Arranged:    DME Agency:     HH Arranged:  PT [place]:  [place]  Status of Service:  Completed, signed off  Medicare Important Message Given:    Date Medicare IM Given:    Medicare IM give by:    Date Additional Medicare IM Given:    Additional Medicare Important Message give by:     If discussed at [place] of Stay Meetings, dates discussed:    Additional Comments:  Dessa Phi, RN 01/01/2015, 10:02 AM

## 2015-01-01 NOTE — Discharge Summary (Signed)
Physician Discharge Summary  Carrie Berry WSF:[id number] DOB: 1950/05/30 DOA: 12/27/2014  PCP: [name], MD  Admit date: 12/27/2014 Discharge date: 01/01/2015  Time spent: 45 minutes  Recommendations for Outpatient Follow-up:  1. Will need complete blood count as well as Chem-12 in about one week 2. Recommend completion of antibiotics as follows  Complete Bactrim DS 01/05/15  Extend overlapping C. difficile coverage with vancomycin by mouth 4 times a day till 01/17/15 3. Home health PT has been recommended for the patient 4. Pain management as per oncologist--patient should follow up for refills of Dilaudid 5. Goals of care discussions to continue as an outpatient via oncologist and primary care physician  Discharge Diagnoses:  Principal Problem:   Sepsis Active Problems:   Breast cancer of right female breast s/p palliative mastectomy 02/17/14   GERD (gastroesophageal reflux disease)   Primary cancer of right breast with metastasis to other site   Lung metastases   Liver metastases   Breast cancer metastasized to brain   FTT (failure to thrive) in adult   Protein-calorie malnutrition, severe   Elevated liver enzymes   DVT (deep venous thrombosis)   Clostridium difficile colitis   Abdominal pain   Discharge Condition: Fair  Diet recommendation: Liberalize diet as an outpatient secondary to protein energy malnutrition as per PCP/oncologist  Filed Weights   12/27/14 1325 12/27/14 1845 12/28/14 0400  Weight: 65.772 kg (145 lb) 66.8 kg (147 lb 4.3 oz) 68.8 kg (151 lb 10.8 oz)    History of present illness:  Sepsis secondary to Escherichia coli pyelonephritis with superinfection Cdiff -Upon admission, patient was febrile with tachycardia as well as leukocytosis. She was mildly hypotensive.  -Patient recently had episode of C. difficile in June and July 2016 and was on vancomycin by mouth -Initially placed on step down unit and vancomycin and Fortaz-->PO Bactrim to  complete 10 days pyelo Rx on 8.23.16 -Spoke with infectious disease, who recommended continuing vancomycin PO for possibility of C. difficile recurrence-needs 2 week overlap on Vancomycin ending on 01/17/15 -Continue to monitor CBC -UC=E.coli which is sensitive  Metastatic breast cancer with poor response to chemo -Metastasis to brain, spine, lung, liver -Follows with Dr. [name], oncology -Patial code status -Patient had a long discussion with oncologist this admission and is not ready for palliative care -Further discussions as an outpatient  Chronic systolic heart failure -Echocardiogram 06/26/2014, EF 40-45% -Monitor daily weights, intake/output + 6 L so far this admission however patient was not plethoric nor volume overloaded on exam on discharge  Mild hypokalemia Replace K of 3.2 with 20 meq Kdur Repeat labs as outpatient and stop supplementation of potassium on discharge as potassium was 3.8  GERD -Continue PPI carefully as an outpatient. I discontinued her omeprazole on discharge and she may be able to take comes as an outpatient  Severe abdominal pain -CT showed no signs of inflammation or bowel obstruction -Possibly secondary to L2 # with radiation -Continue PPI and pain control -Lipase was normal, LFTs trending downward -Abdominal ultrasound: Showed contracted gallbladder however no cholecystitis  -she probably has pain from hepatomegaly  History of DVT -Continue full dose Lovenox lifelong given h/o Cancer  Prolonged QTC -Likely secondary to trazodone, Remeron, Compazine, cetrizine (medications held) -Magnesium 1.8  Failure to thrive -PT and nutrition consulted  Recent history of C. difficile colitis -Plan as above  Consultations: Oncology  Exam: Filed Vitals:   01/01/15 0537  BP: 147/67  Pulse: 97  Temp: 97.7 F (36.5 C)  Resp: 18  General: Doing well no issues EOMI NCAT ambulatory  Cardiovascular: S1-S2 no murmur rub or gallop  Respiratory:  Clinically clear no added sound   abdomen soft nontender nondistended no rebound no guarding  Discharge Instructions   Discharge Instructions    Diet - low sodium heart healthy    Complete by:  As directed      Discharge instructions    Complete by:  As directed   Complete course of Bactrim DS on 01/05/15 Complete Vancomycin 01/17/15 DO NOT TAKE ACID REDUCER unless ABSOLUTELY NEEDED-omeprazole can cause Cdiff to worsen You will need labs in ~ 1 week at [facility] offic-We will arrange an appt I will forward your records to your oncologist as well Good luck and take care     Increase activity slowly    Complete by:  As directed           Current Discharge Medication List    START taking these medications   Details  sulfamethoxazole-trimethoprim (BACTRIM DS,SEPTRA DS) 800-160 MG per tablet Take 1 tablet by mouth every 12 (twelve) hours. Qty: 8 tablet, Refills: 0      CONTINUE these medications which have CHANGED   Details  vancomycin (VANCOCIN) 125 MG capsule Take 1 capsule (125 mg total) by mouth 4 (four) times daily. Qty: 70 capsule, Refills: 0   Associated Diagnoses: Breast cancer of upper-outer quadrant of right female breast      CONTINUE these medications which have NOT CHANGED   Details  acyclovir (ZOVIRAX) 400 MG tablet Take 1 tablet (400 mg total) by mouth 2 (two) times daily. Qty: 60 tablet, Refills: 6    Benzocaine (ORAL GEL ANESTHETIC MT) Use as directed 1 application in the mouth or throat 4 (four) times daily as needed (mouth pain).    cetirizine (ZYRTEC) 10 MG tablet Take 10 mg by mouth daily as needed for allergies.     cholecalciferol (VITAMIN D) 1000 UNITS tablet Take 1,000 Units by mouth daily.    [employer] GAS RELIEF 80 MG chewable tablet CHEW 1 TABLET BY MOUTH EVERY 6 (SIX) HOURS AS NEEDED FOR FLATULENCE. Qty: 30 tablet, Refills: 0    dexamethasone (DECADRON) 2 MG tablet Take 2 mg by mouth daily.  Refills: 4    enoxaparin (LOVENOX) 100 MG/ML injection INJECT  0.95MLS (95MG  TOTAL) INTO THE SKIN DAILY Qty: 30 Syringe, Refills: 3    fluconazole (DIFLUCAN) 100 MG tablet Take 100 mg by mouth daily.    gabapentin (NEURONTIN) 100 MG capsule TAKE 1 CAPSULE BY MOUTH 3 (THREE) TIMES DAILY. Qty: 90 capsule, Refills: 2    HYDROmorphone (DILAUDID) 2 MG tablet Take 3 tablets (6 mg total) by mouth every 4 (four) hours as needed for severe pain. Qty: 240 tablet, Refills: 0    levETIRAcetam (KEPPRA) 1000 MG tablet TAKE 1 TABLET (1,000 MG TOTAL) BY MOUTH EVERY 12 (TWELVE) HOURS. Qty: 60 tablet, Refills: 4    lidocaine-prilocaine (EMLA) cream Apply 1 application topically daily as needed (prior to port being access). Apply to affected area once Qty: 30 g, Refills: 3   Associated Diagnoses: Breast cancer metastasized to brain, right; Secondary malignant neoplasm of right lung; Liver metastases; Primary cancer of right breast with metastasis to other site; Breast cancer of upper-outer quadrant of right female breast; Malignant neoplasm metastatic to lung, unspecified laterality; Constipation, unspecified constipation type; FTT (failure to thrive) in adult; Breast cancer in situ, unspecified laterality    methylphenidate (RITALIN) 5 MG tablet Take 1 tablet (5 mg total) by mouth  2 (two) times daily with breakfast and lunch. Qty: 60 tablet, Refills: 0   Associated Diagnoses: Breast cancer metastasized to brain, right; Liver metastases; Primary cancer of right breast with metastasis to other site    mirtazapine (REMERON) 30 MG tablet Take 0.5 tablets (15 mg total) by mouth at bedtime. Qty: 30 tablet, Refills: 1   Associated Diagnoses: Breast cancer metastasized to brain, right; Breast cancer of upper-outer quadrant of right female breast; Liver metastases; Malignant neoplasm metastatic to lung, unspecified laterality; Primary cancer of right breast with metastasis to other site; Constipation, unspecified constipation type; FTT (failure to thrive) in adult; Secondary  malignant neoplasm of right lung; Breast cancer in situ, unspecified laterality    Multiple Vitamin (MULTIVITAMIN WITH MINERALS) TABS tablet Take 1 tablet by mouth daily.    Phenol (SORE THROAT SPRAY MT) Use as directed 4 sprays in the mouth or throat 4 (four) times daily as needed (mouth pain).    Probiotic Product (PROBIOTIC PO) Take 1 capsule by mouth daily.    prochlorperazine (COMPAZINE) 10 MG tablet TAKE 1 TABLET (10 MG TOTAL) BY MOUTH EVERY 6 (SIX) HOURS AS NEEDED (NAUSEA OR VOMITING). Qty: 30 tablet, Refills: 1   Associated Diagnoses: Malignant neoplasm of right breast    RA KRILL OIL PO Take 1 capsule by mouth daily.    traZODone (DESYREL) 50 MG tablet TAKE 2 TABLETS BY MOUTH AT BEDTIME FOR SLEEP Qty: 60 tablet, Refills: 0      STOP taking these medications     omeprazole (PRILOSEC) 20 MG capsule      metroNIDAZOLE (FLAGYL) 500 MG tablet        Allergies  Allergen Reactions  . Codeine Nausea And Vomiting    "violently ill"  . Penicillins Rash    Childhood reaction -   Follow-up Information    Follow up with [facility].   Why:  HH[facility]   Contact information:   [address] [place]  [address] [phone]        The results of significant diagnostics from this hospitalization (including imaging, microbiology, ancillary and laboratory) are listed below for reference.    Significant Diagnostic Studies: Dg Chest 2 View  12/27/2014   CLINICAL DATA:  Shortness of breath.  EXAM: CHEST  2 VIEW  COMPARISON:  November 10, 2014.  FINDINGS: Stable cardiomegaly. Right internal jugular Port-A-Cath is unchanged in position with distal tip overlying expected position of the SVC. No pneumothorax or pleural effusion is noted. Two nodules are noted in the left upper lobe concerning for metastatic disease. Stable blunting of left costophrenic sulcus is noted consistent with small effusion or scarring. Small nodule is noted in right upper lobe.  IMPRESSION:  Bilateral pulmonary nodules are noted concerning for metastatic disease. Stable blunting of left costophrenic sulcus consistent with scarring or small pleural effusion.   Electronically Signed   By: Marijo Conception, M.D.   On: 12/27/2014 14:25   Nm Bone Scan Whole Body  12/04/2014   CLINICAL DATA:  Breast cancer.  EXAM: NUCLEAR MEDICINE WHOLE BODY BONE SCAN  TECHNIQUE: Whole body anterior and posterior images were obtained approximately 3 hours after intravenous injection of radiopharmaceutical.  RADIOPHARMACEUTICALS:  27.4 mCi Technetium-110m MDP IV  COMPARISON:  CT 11/10/2014 .  PET-CT 01/07/2013.  FINDINGS: Bilateral renal function and excretion is noted. Increased activity noted over the L1 area. Further evaluation of the lumbar spine to evaluate for compression fracture or metastatic disease can be performed with MRI. No abnormality  noted in this region on prior CT of 11/10/2014. No other abnormal bony lesions are noted.  IMPRESSION: Increased activity noted over the L1 region. This is suspicious for active process such as compression fracture or metastatic disease. No abnormality noted in this region on prior CT of 11/10/2014. Gadolinium-enhanced MRI of the lumbar spine can be obtained to further evaluate.   Electronically Signed   By: Marcello Moores  Register   On: 12/04/2014 12:49   Ct Abdomen Pelvis W Contrast  12/27/2014   CLINICAL DATA:  64 year old female on chemotherapy for metastatic breast cancer. Fever, abdominal pain, foul smelling urine. Initial encounter.  EXAM: CT ABDOMEN AND PELVIS WITH CONTRAST  TECHNIQUE: Multidetector CT imaging of the abdomen and pelvis was performed using the standard protocol following bolus administration of intravenous contrast.  CONTRAST:  126mL OMNIPAQUE IOHEXOL 300 MG/ML  SOLN  COMPARISON:  CT Abdomen and Pelvis 11/10/2014 and earlier  FINDINGS: Lower lobe lung metastases appear mildly progressed since June, now all ranging from 12-18 mm diameter (11-14 mm previously).  Superimposed pulmonary atelectasis. No pericardial or pleural effusion.  There is a new mild superior endplate fracture of the L2 vertebral body. No overt metastatic disease identified in that vertebra. No destructive osseous lesion identified elsewhere.  Negative uterus and adnexa. No pelvic free fluid. Decompressed distal colon. There is mild bladder wall thickening and hyper enhancement compared to the prior study (series 2, image 82). No gas within the bladder. There is mild perivesical stranding.  Decompressed sigmoid colon. Negative left colon and distal transverse colon. Retained stool from the cecum to the proximal transverse colon. Negative terminal ileum. Appendix appears to remain normal. No dilated small bowel. Decompressed stomach and duodenum.  Numerous subcutaneous nodules in the ventral abdominal wall, likely related to subcutaneous injections. Progressed hypodense liver lesions, increased in size and number since June. Confluent right lobe involvement. There may be some associated right lobe biliary dilatation, uncertain. Trace perihepatic free fluid. The portal venous system remains patent.  Negative gallbladder, spleen, pancreas and adrenal glands. Renal enhancement and contrast excretion within normal limits. Small retroperitoneal lymph nodes are stable.  There is evidence of nonocclusive thrombus in both external iliac veins and both common femoral veins, appears more extensive on the left.  IMPRESSION: 1. Progressed pulmonary and hepatic metastatic disease since June. 2. Nonocclusive bilateral pelvic and femoral vein thrombus (nonocclusive DVTs). 3. Mild urinary bladder wall thickening and stranding compatible with urinary tract infection. 4. Acute to subacute L2 compression fracture which could be related to osteoporosis or metastatic disease.   Electronically Signed   By: Genevie Ann M.D.   On: 12/27/2014 16:22   US Abdomen Limited Ruq  12/27/2014   CLINICAL DATA:  Abdominal pain x2 days,  elevated LFTs  EXAM: US ABDOMEN LIMITED - RIGHT UPPER QUADRANT  COMPARISON:  CT abdomen pelvis dated 12/27/2014.  FINDINGS: Gallbladder:  Contracted gallbladder. Secondary gallbladder wall thickening. No gallstones or pericholecystic fluid. Negative sonographic Murphy's sign.  Common bile duct:  Diameter: 2 mm.  Liver:  Heterogeneous appearance of the liver. Numerous ill-defined hypoechoic metastases are better visualized on recent CT abdomen pelvis.  IMPRESSION: Contracted gallbladder without associated sonographic findings to suggest acute cholecystitis.  Numerous ill-defined hypoechoic metastases, better evaluated on CT.   Electronically Signed   By: Julian Hy M.D.   On: 12/27/2014 18:02    Microbiology: Recent Results (from the past 240 hour(s))  TECHNOLOGIST REVIEW     Status: None   Collection Time: 12/23/14  9:40 AM  Result Value Ref Range Status   Technologist Review Metas and Myelocytes present, rare Nrbc  Final  Blood Culture (routine x 2)     Status: None (Preliminary result)   Collection Time: 12/27/14  1:30 PM  Result Value Ref Range Status   Specimen Description PORTA CATH  Final   Special Requests BOTTLES DRAWN AEROBIC AND ANAEROBIC [place]  Final   Culture   Final    NO GROWTH 4 DAYS Performed at [facility]    Report Status PENDING  Incomplete  Urine culture     Status: None   Collection Time: 12/27/14  4:45 PM  Result Value Ref Range Status   Specimen Description URINE, CATHETERIZED  Final   Special Requests NONE  Final   Culture   Final    >=100,000 COLONIES/mL ESCHERICHIA COLI Performed at [facility]    Report Status 12/31/2014 FINAL  Final   Organism ID, Bacteria ESCHERICHIA COLI  Final      Susceptibility   Escherichia coli - MIC*    AMPICILLIN >=32 RESISTANT Resistant     CEFAZOLIN <=4 SENSITIVE Sensitive     CEFTRIAXONE <=1 SENSITIVE Sensitive     CIPROFLOXACIN <=0.25 SENSITIVE Sensitive     GENTAMICIN <=1 SENSITIVE Sensitive      IMIPENEM <=0.25 SENSITIVE Sensitive     NITROFURANTOIN 128 RESISTANT Resistant     TRIMETH/SULFA <=20 SENSITIVE Sensitive     AMPICILLIN/SULBACTAM >=32 RESISTANT Resistant     PIP/TAZO <=4 SENSITIVE Sensitive     * >=100,000 COLONIES/mL ESCHERICHIA COLI  MRSA PCR Screening     Status: None   Collection Time: 12/27/14  6:40 PM  Result Value Ref Range Status   MRSA by PCR NEGATIVE NEGATIVE Final    Comment:        The GeneXpert MRSA Assay (FDA approved for NASAL specimens only), is one component of a comprehensive MRSA colonization surveillance program. It is not intended to diagnose MRSA infection nor to guide or monitor treatment for MRSA infections.   C difficile quick scan w PCR reflex     Status: Abnormal   Collection Time: 12/29/14 12:21 PM  Result Value Ref Range Status   C Diff antigen POSITIVE (A) NEGATIVE Final    Comment: CRITICAL RESULT CALLED TO, READ BACK BY AND VERIFIED WITH: [place] [id number] @ [id number] BY J SCOTTON    C Diff toxin NEGATIVE NEGATIVE Final   C Diff interpretation   Final    C. difficile present, but toxin not detected. This indicates colonization. In most cases, this does not require treatment. If patient has signs and symptoms consistent with colitis, consider treatment.     Labs: Basic Metabolic Panel:  Recent Labs Lab 12/27/14 1708 12/28/14 0437 12/29/14 0515 12/30/14 0525 12/31/14 0940 01/01/15 0400  NA  --  140 137 143 142 141  K  --  3.7 3.3* 3.7 3.2* 3.8  CL  --  111 111 114* 111 107  CO2  --  21* 22 21* 25 25  GLUCOSE  --  76 168* 104* 105* 72  BUN  --  6 7 7 10 7   CREATININE  --  0.41* 0.46 0.38* 0.48 0.43*  CALCIUM  --  7.9* 7.7* 8.2* 8.5* 8.8*  MG 1.8  --   --   --   --   --    Liver Function Tests:  Recent Labs Lab 12/27/14 1330 12/28/14 0437 12/29/14 0515 12/30/14 0525  AST 113* 103* 98* 71*  ALT 100* 89* 83* 74*  ALKPHOS 622* 503* 416* 418*  BILITOT 1.0 1.3* 0.5 0.4  PROT 5.8* 5.1* 5.4* 4.9*  ALBUMIN  2.5* 2.1* 1.8* 2.0*    Recent Labs Lab 12/27/14 1330  LIPASE 43   No results for input(s): AMMONIA in the last 168 hours. CBC:  Recent Labs Lab 12/28/14 0437 12/29/14 0515 12/30/14 0525 12/31/14 0940 01/01/15 0400  WBC 21.7* 22.2* 16.9* 13.4* 12.9*  NEUTROABS 19.1*  --   --  11.2*  --   HGB 10.6* 9.9* 9.4* 10.0* 11.2*  HCT 34.0* 31.3* 29.6* 30.9* 35.3*  MCV 103.0* 101.6* 101.0* 102.7* 102.3*  PLT 133* 121* 132* 136* 156   Cardiac Enzymes: No results for input(s): CKTOTAL, CKMB, CKMBINDEX, TROPONINI in the last 168 hours. BNP: BNP (last 3 results) No results for input(s): BNP in the last 8760 hours.  ProBNP (last 3 results) No results for input(s): PROBNP in the last 8760 hours.  CBG: No results for input(s): GLUCAP in the last 168 hours.     SignedNita Sells  [facility] 01/01/2015, 8:40 AM

## 2015-01-02 ENCOUNTER — Other Ambulatory Visit: Payer: Self-pay | Admitting: Internal Medicine

## 2015-01-12 ENCOUNTER — Ambulatory Visit
Admission: RE | Admit: 2015-01-12 | Discharge: 2015-01-12 | Disposition: A | Discharge status: Home / Self Care | Payer: BC Managed Care – PPO | Source: Ambulatory Visit | Attending: Radiation Oncology | Admitting: Radiation Oncology

## 2015-01-12 DIAGNOSIS — C7931 Secondary malignant neoplasm of brain: Secondary | ICD-10-CM

## 2015-01-12 MED ORDER — GADOBENATE DIMEGLUMINE 529 MG/ML IV SOLN
13.0000 mL | Freq: Once | INTRAVENOUS | Status: AC | PRN
  Administered 2015-01-12: 13 mL via INTRAVENOUS

## 2015-01-13 ENCOUNTER — Encounter: Payer: Self-pay | Admitting: *Deleted

## 2015-01-13 ENCOUNTER — Ambulatory Visit
Admission: RE | Admit: 2015-01-13 | Discharge: 2015-01-13 | Disposition: A | Discharge status: Home / Self Care | Payer: BC Managed Care – PPO | Source: Ambulatory Visit | Attending: Radiation Oncology | Admitting: Radiation Oncology

## 2015-01-13 ENCOUNTER — Emergency Department (HOSPITAL_COMMUNITY)
Admission: EM | Admit: 2015-01-13 | Discharge: 2015-01-13 | Disposition: A | Discharge status: Home / Self Care | Payer: BC Managed Care – PPO | Source: Home / Self Care | Attending: Emergency Medicine | Admitting: Emergency Medicine

## 2015-01-13 ENCOUNTER — Other Ambulatory Visit: Payer: Self-pay | Admitting: Oncology

## 2015-01-13 ENCOUNTER — Encounter (HOSPITAL_COMMUNITY): Payer: Self-pay | Admitting: Emergency Medicine

## 2015-01-13 ENCOUNTER — Emergency Department (HOSPITAL_COMMUNITY): Payer: BC Managed Care – PPO

## 2015-01-13 DIAGNOSIS — C7931 Secondary malignant neoplasm of brain: Secondary | ICD-10-CM

## 2015-01-13 DIAGNOSIS — R531 Weakness: Secondary | ICD-10-CM

## 2015-01-13 DIAGNOSIS — R945 Abnormal results of liver function studies: Secondary | ICD-10-CM

## 2015-01-13 DIAGNOSIS — R627 Adult failure to thrive: Secondary | ICD-10-CM | POA: Diagnosis not present

## 2015-01-13 DIAGNOSIS — R7989 Other specified abnormal findings of blood chemistry: Secondary | ICD-10-CM

## 2015-01-13 DIAGNOSIS — C50919 Malignant neoplasm of unspecified site of unspecified female breast: Secondary | ICD-10-CM

## 2015-01-13 DIAGNOSIS — F419 Anxiety disorder, unspecified: Secondary | ICD-10-CM | POA: Insufficient documentation

## 2015-01-13 DIAGNOSIS — M1990 Unspecified osteoarthritis, unspecified site: Secondary | ICD-10-CM | POA: Insufficient documentation

## 2015-01-13 DIAGNOSIS — Z79899 Other long term (current) drug therapy: Secondary | ICD-10-CM | POA: Insufficient documentation

## 2015-01-13 DIAGNOSIS — Z7952 Long term (current) use of systemic steroids: Secondary | ICD-10-CM | POA: Insufficient documentation

## 2015-01-13 DIAGNOSIS — Z853 Personal history of malignant neoplasm of breast: Secondary | ICD-10-CM | POA: Insufficient documentation

## 2015-01-13 DIAGNOSIS — R748 Abnormal levels of other serum enzymes: Secondary | ICD-10-CM | POA: Insufficient documentation

## 2015-01-13 DIAGNOSIS — K219 Gastro-esophageal reflux disease without esophagitis: Secondary | ICD-10-CM | POA: Insufficient documentation

## 2015-01-13 DIAGNOSIS — Z880 Allergy status to penicillin: Secondary | ICD-10-CM | POA: Insufficient documentation

## 2015-01-13 DIAGNOSIS — C50911 Malignant neoplasm of unspecified site of right female breast: Secondary | ICD-10-CM

## 2015-01-13 DIAGNOSIS — Z862 Personal history of diseases of the blood and blood-forming organs and certain disorders involving the immune mechanism: Secondary | ICD-10-CM | POA: Insufficient documentation

## 2015-01-13 LAB — CBC WITH DIFFERENTIAL/PLATELET
BASOS ABS: 0 10*3/uL (ref 0.0–0.1)
Basophils Relative: 0 % (ref 0–1)
Eosinophils Absolute: 0 10*3/uL (ref 0.0–0.7)
Eosinophils Relative: 0 % (ref 0–5)
HCT: 37.9 % (ref 36.0–46.0)
HEMOGLOBIN: 12 g/dL (ref 12.0–15.0)
LYMPHS ABS: 0.5 10*3/uL — AB (ref 0.7–4.0)
LYMPHS PCT: 4 % — AB (ref 12–46)
MCH: 31.8 pg (ref 26.0–34.0)
MCHC: 31.7 g/dL (ref 30.0–36.0)
MCV: 100.5 fL — AB (ref 78.0–100.0)
Monocytes Absolute: 1 10*3/uL (ref 0.1–1.0)
Monocytes Relative: 7 % (ref 3–12)
NEUTROS ABS: 12.4 10*3/uL — AB (ref 1.7–7.7)
NEUTROS PCT: 89 % — AB (ref 43–77)
PLATELETS: 232 10*3/uL (ref 150–400)
RBC: 3.77 MIL/uL — AB (ref 3.87–5.11)
RDW: 17.4 % — ABNORMAL HIGH (ref 11.5–15.5)
WBC: 13.9 10*3/uL — AB (ref 4.0–10.5)

## 2015-01-13 LAB — COMPREHENSIVE METABOLIC PANEL
ALK PHOS: 876 U/L — AB (ref 38–126)
ALT: 119 U/L — AB (ref 14–54)
AST: 128 U/L — AB (ref 15–41)
Albumin: 2.6 g/dL — ABNORMAL LOW (ref 3.5–5.0)
Anion gap: 9 (ref 5–15)
BUN: 15 mg/dL (ref 6–20)
CALCIUM: 9 mg/dL (ref 8.9–10.3)
CHLORIDE: 102 mmol/L (ref 101–111)
CO2: 24 mmol/L (ref 22–32)
CREATININE: 0.47 mg/dL (ref 0.44–1.00)
Glucose, Bld: 113 mg/dL — ABNORMAL HIGH (ref 65–99)
Potassium: 4.2 mmol/L (ref 3.5–5.1)
Sodium: 135 mmol/L (ref 135–145)
Total Bilirubin: 0.9 mg/dL (ref 0.3–1.2)
Total Protein: 6.2 g/dL — ABNORMAL LOW (ref 6.5–8.1)

## 2015-01-13 LAB — LIPASE, BLOOD: LIPASE: 78 U/L — AB (ref 22–51)

## 2015-01-13 LAB — I-STAT TROPONIN, ED: TROPONIN I, POC: 0 ng/mL (ref 0.00–0.08)

## 2015-01-13 MED ORDER — SODIUM CHLORIDE 0.9 % IV BOLUS (SEPSIS)
500.0000 mL | Freq: Once | INTRAVENOUS | Status: AC
  Administered 2015-01-13: 500 mL via INTRAVENOUS

## 2015-01-13 NOTE — ED Provider Notes (Signed)
CSN: [id number]     Arrival date & time 01/13/15  1145 History   First MD Initiated Contact with Patient 01/13/15 1149     Chief Complaint  Patient presents with  . Abdominal Pain  . Dehydration     (Consider location/radiation/quality/duration/timing/severity/associated sxs/prior Treatment) HPI 64 year old female with metastatic breast cancer who was discharged 12 days ago after admission for sepsis. She has had continued weakness since discharge and has not been ambulatory. She has had some increased weakness today and epigastric discomfort. She ate breakfast this morning and has not had any vomiting. She has had some nausea. No fever or chills are noted. Past Medical History  Diagnosis Date  . Recurrent sinus infections   . Arthritis   . PONV (postoperative nausea and vomiting)   . GERD (gastroesophageal reflux disease)   . 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 Anxiety     no problems at present  . Breast cancer   . 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 Radiation 07/16/14-07/22/14    Right chest wall nodule 20 Gy in 5 fractions  . Brain cancer     breast ca with brain mets  . 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 Tubal ligation  1985  . Portacath placement N/A 01/10/2013    Procedure: ULTRASOUND GUIDED PORT-A-CATH INSERTION WITH FLUOROSCOPY;  Surgeon: [name], MD;  Location: [facility];  Service: General;  Laterality: N/A;  . Esophagogastroduodenoscopy N/A 08/19/2013    Procedure: ESOPHAGOGASTRODUODENOSCOPY (EGD);  Surgeon: Garlan Fair, MD;  Location: [facility];  Service: Endoscopy;  Laterality: N/A;  . Total mastectomy Right 02/17/2014    Procedure: PALLIATIVE  RIGHT MASTECTOMY;  Surgeon: [name], MD;  Location: [facility];  Service: General;  Laterality: Right;   Family History  Problem Relation Age of Onset  . Bladder Cancer Father   . Colon cancer Maternal  Grandmother    Social History  Substance Use Topics  . Smoking status: Never Smoker   . Smokeless tobacco: Never Used  . Alcohol Use: No   OB History    No data available     Review of Systems  All other systems reviewed and are negative.     Allergies  Codeine and Penicillins  Home Medications   Prior to Admission medications   Medication Sig Start Date End Date Taking? Authorizing Provider  acetaminophen (TYLENOL) 500 MG tablet Take 1,000 mg by mouth every 6 (six) hours as needed for mild pain, moderate pain or headache.   Yes Historical Provider, MD  acyclovir (ZOVIRAX) 400 MG tablet Take 1 tablet (400 mg total) by mouth 2 (two) times daily. 07/22/14  Yes Chauncey Cruel, MD  Benzocaine (ORAL GEL ANESTHETIC MT) Use as directed 1 application in the mouth or throat 4 (four) times daily as needed (mouth pain).   Yes Historical Provider, MD  cetirizine (ZYRTEC) 10 MG tablet Take 10 mg by mouth daily as needed for allergies.    Yes Historical Provider, MD  cholecalciferol (VITAMIN D) 1000 UNITS tablet Take 1,000 Units by mouth daily.   Yes Historical Provider, MD  [employer] GAS RELIEF 80 MG chewable tablet CHEW 1 TABLET BY MOUTH EVERY 6 (SIX) HOURS AS NEEDED FOR FLATULENCE. 09/04/14  Yes Chauncey Cruel, MD  dexamethasone (DECADRON) 2 MG tablet Take 2 mg by mouth daily.  08/02/14  Yes Historical Provider, MD  enoxaparin (LOVENOX) 100 MG/ML injection  INJECT 0.95MLS (95MG  TOTAL) INTO THE SKIN DAILY 11/30/14  Yes Chauncey Cruel, MD  fluconazole (DIFLUCAN) 100 MG tablet Take 100 mg by mouth daily.   Yes Historical Provider, MD  gabapentin (NEURONTIN) 100 MG capsule TAKE 1 CAPSULE BY MOUTH 3 (THREE) TIMES DAILY. 12/24/14  Yes Chauncey Cruel, MD  HYDROmorphone (DILAUDID) 2 MG tablet Take 3 tablets (6 mg total) by mouth every 4 (four) hours as needed for severe pain. 12/23/14  Yes Laurie Panda, NP  levETIRAcetam (KEPPRA) 1000 MG tablet TAKE 1 TABLET (1,000 MG TOTAL) BY MOUTH EVERY 12  (TWELVE) HOURS. 10/13/14  Yes Chauncey Cruel, MD  lidocaine-prilocaine (EMLA) cream Apply 1 application topically daily as needed (prior to port being access). Apply to affected area once 11/14/14  Yes Venetia Maxon Rama, MD  methylphenidate (RITALIN) 5 MG tablet Take 1 tablet (5 mg total) by mouth 2 (two) times daily with breakfast and lunch. 12/23/14  Yes Laurie Panda, NP  mirtazapine (REMERON) 30 MG tablet Take 0.5 tablets (15 mg total) by mouth at bedtime. Patient taking differently: Take 30 mg by mouth at bedtime.  08/20/14  Yes Chauncey Cruel, MD  Multiple Vitamin (MULTIVITAMIN WITH MINERALS) TABS tablet Take 1 tablet by mouth daily.   Yes Historical Provider, MD  omeprazole (PRILOSEC) 20 MG capsule Take 20 mg by mouth daily.  12/20/14  Yes Historical Provider, MD  Probiotic Product (PROBIOTIC PO) Take 1 capsule by mouth daily.   Yes Historical Provider, MD  prochlorperazine (COMPAZINE) 10 MG tablet TAKE 1 TABLET (10 MG TOTAL) BY MOUTH EVERY 6 (SIX) HOURS AS NEEDED (NAUSEA OR VOMITING). 06/09/14  Yes Chauncey Cruel, MD  RA KRILL OIL PO Take 1 capsule by mouth daily.   Yes Historical Provider, MD  traZODone (DESYREL) 50 MG tablet TAKE 2 TABLETS BY MOUTH AT BEDTIME FOR SLEEP Patient taking differently: Take 100 mg by mouth at bedtime as needed for sleep.  12/23/14  Yes Laurie Panda, NP  vancomycin (VANCOCIN) 125 MG capsule Take 1 capsule (125 mg total) by mouth 4 (four) times daily. 01/01/15 01/17/15 Yes Nita Sells, MD  sulfamethoxazole-trimethoprim (BACTRIM DS,SEPTRA DS) 800-160 MG per tablet Take 1 tablet by mouth every 12 (twelve) hours. Patient not taking: Reported on 01/13/2015 01/01/15   Nita Sells, MD   BP 112/78 mmHg  Pulse 83  Temp(Src) 97.2 F (36.2 C) (Rectal)  Resp 18  Ht 5\' 4"  (1.626 m)  Wt 145 lb (65.772 kg)  BMI 24.88 kg/m2  SpO2 95% Physical Exam  Constitutional: She is oriented to person, place, and time. She appears well-developed and  well-nourished.  Chronically ill-appearing  HENT:  Head: Normocephalic and atraumatic.  Right Ear: External ear normal.  Left Ear: External ear normal.  Nose: Nose normal.  Mucous membranes are dry  Eyes: Conjunctivae and EOM are normal. Pupils are equal, round, and reactive to light.  Neck: Normal range of motion. Neck supple.  Cardiovascular: Normal rate, regular rhythm, normal heart sounds and intact distal pulses.   Pulmonary/Chest: Effort normal and breath sounds normal.  Abdominal: Soft. Bowel sounds are normal. There is tenderness.  Mild epigastric tenderness to palpation  Musculoskeletal: Normal range of motion.  Neurological: She is alert and oriented to person, place, and time. She has normal reflexes.  Skin: Skin is warm and dry.  Psychiatric: She has a normal mood and affect. Her behavior is normal. Judgment and thought content normal.  Nursing note and vitals reviewed.   ED Course  Procedures (including critical care time) Labs Review Labs Reviewed  CBC WITH DIFFERENTIAL/PLATELET  COMPREHENSIVE METABOLIC PANEL  LIPASE, BLOOD  I-STAT TROPOININ, ED    Imaging Review Dg Chest 1 View  01/13/2015   CLINICAL DATA:  64 year old female with right side chest pain and fever since this morning. Current history of metastatic breast cancer. Subsequent encounter.  EXAM: CHEST  1 VIEW  COMPARISON:  12/27/2014 and earlier.  FINDINGS: Multiple bilateral lung nodules re- identified compatible with known pulmonary metastatic disease. These are largest at the left lung base as before. Suggestion of small left pleural effusion, or alternatively left pleural scarring. Stable cardiac size and mediastinal contours. Stable right chest porta cath. No pneumothorax or pulmonary edema. No consolidation.  IMPRESSION: No new cardiopulmonary abnormality. Pulmonary metastatic disease re- identified.   Electronically Signed   By: Genevie Ann M.D.   On: 01/13/2015 14:13   Mr Jeri Cos UY Contrast  01/12/2015    CLINICAL DATA:  Breast cancer, stage IV. Whole-brain radiation completed 03/2014. Continued surveillance.  EXAM: MRI HEAD WITHOUT AND WITH CONTRAST  TECHNIQUE: Multiplanar, multiecho pulse sequences of the brain and surrounding structures were obtained without and with intravenous contrast.  CONTRAST:  16mL MULTIHANCE GADOBENATE DIMEGLUMINE 529 MG/ML IV SOLN  COMPARISON:  MRI brain 10/30/2014  FINDINGS: Multiple intracranial metastatic deposits are redemonstrated. Within limits for assessment due to significant background post treatment leukoencephalopathy, no significant vasogenic edema.  - RIGHT cerebellum, 10 mm, image 39.  - LEFT occipital lobe, 2 mm,  image 65.  - RIGHT posterior temporal lobe, 9 mm, image 74.  - RIGHT thalamus, 2 mm, image 76.  - RIGHT occipital white matter, 3 mm, image 79.  - RIGHT periatrial white matter, 2 mm, image 83.  - RIGHT posterior centrum semiovale, 5 mm, image 94.  - RIGHT posterior frontal subcortical white matter, 3 mm, image 96.  -RIGHT superior frontal subcortical white matter, 2 mm, image 114.  -RIGHT superior frontal pole subcortical white matter, undetectable, image 120.  There are few lesions which were observed on the June scan, 1 mm in size, difficult to visualize on today's study.  Global atrophy. Severe white matter disease, stable. Tiny foci of chronic hemorrhage associated with treated lesions, stable. No osseous disease. No sinus air-fluid level. Negative orbits. Increased middle ear and mastoid fluid on the LEFT compared with priors, with suggestion of postcontrast enhancement. LEFT Otitis/mastoiditis not excluded. Correlate clinically. Major dural venous sinuses are patent.  IMPRESSION: At least 10 intracranial metastatic lesions as described, without significant progression from June. A few punctate 1 mm lesions are now difficult to visualize at all.  Specifically, the new lesion which was detected on the June scan, 2 mm in the RIGHT thalamus, has not shown  significant progression since that exam.   Electronically Signed   By: Staci Righter M.D.   On: 01/12/2015 13:33   I have personally reviewed and evaluated these images and lab results as part of my medical decision-making.   EKG Interpretation   Date/Time:  Wednesday January 13 2015 13:59:36 EDT Ventricular Rate:  84 PR Interval:  161 QRS Duration: 78 QT Interval:  404 QTC Calculation: 478 R Axis:   14 Text Interpretation:  Normal sinus rhythm T wave inversion Anterior leads  has changed from first prior ekg of 12/27/14 Confirmed by Kaelan Amble MD, Andee Poles  [phone]) on 01/13/2015 2:34:18 PM      MDM   Final diagnoses:  Weakness  Cancer of right breast metastatic to brain  Elevated LFTs  Elevated lipase   Discussed results with Dr.Magrinat and he will call in home palliative care consult.  Discussed results with patient and husband.  Lipase and lfts increased from prior- some pain likely from this with possible early pancretitis.  Husband reviewed mri with radiation oncologist.  He states Dr. Tammi Klippel, radiation oncologist, suggested discontinuing Keppra and neurontin which may contribute to her weakness but may not be necessary as she has not had a seizure and denies current neuropathic pain.  I advise consulting with Dr. [name] regarding stopping these meds.    Husband with multiple questions about patient's weakness- he requests that I discuss stopping some meds with Dr. [name].   We will stop gabapentin at this time. They have an appointment to follow-up with Dr. [name]. They did not want to increase the Decadron due to her skin friability.  [name], MD 01/13/15 [phone]

## 2015-01-13 NOTE — ED Notes (Signed)
Bed: VX48 Expected date:  Expected time:  Means of arrival:  Comments: EMS- 60s, abdominal pain/dehydration?

## 2015-01-13 NOTE — Progress Notes (Signed)
Per Dr. [name], I made a referral to Hospice and [facility]. Patient is currently admitted to [facility].

## 2015-01-13 NOTE — ED Notes (Signed)
Pt comes in today with EMS from home with a c/o abdominal pain and possible dehydration. Pt states that she has been feeling bad over the past several months but today she woke with right upper abdominal pain. Pt has had nausea and dry heaving with no vomiting. Pt denies any diarrhea. Pt was just released from the hospital and was admitted for dehydration. Pt does have breast cancer and last chemo treatment was in June of this year.

## 2015-01-13 NOTE — Discharge Instructions (Signed)
Stop gabapentin as discussed.  Push oral liquids.  Recheck with Dr. [name] as scheduled.

## 2015-01-14 ENCOUNTER — Other Ambulatory Visit: Payer: Self-pay | Admitting: Oncology

## 2015-01-14 ENCOUNTER — Telehealth: Payer: Self-pay | Admitting: Radiation Oncology

## 2015-01-14 NOTE — Telephone Encounter (Signed)
Returned message left by patient's husband. Patient scheduled for a follow up with Dr. Tammi Klippel today. Husband reports the patient is weak, dehydrated and has declined greatly. Reports he would like to cancel follow up appointment and may call 911. Encouraged he do so. Will touch base later today with patient's husband to determine patient status.

## 2015-01-14 NOTE — Progress Notes (Unsigned)
[name] called to report that Carrie Berry was more confused, eating less, and he was a very rough night last night.  They were seen in the emergency room within the last 2 days and then they also saw Dr. Tammi Klippel who reviewed her brain MRI which had found no significant change.   It could be that Carrie Berry is tolerating some of her medications poorly, or it could be that she is simply declining. I suggested to [name] that he give her Ritalin only with breakfast and not with lunch but also that he up her dexamethasone to 8 mg now and 8 mg tomorrow morning. He will call me in the morning. If they have another bout night likely 1 he had before she is going to need to be admitted with a view to either nursing home placement or eventual become place placement

## 2015-01-15 ENCOUNTER — Encounter (HOSPITAL_COMMUNITY): Payer: Self-pay | Admitting: *Deleted

## 2015-01-15 ENCOUNTER — Other Ambulatory Visit: Payer: Self-pay | Admitting: Oncology

## 2015-01-15 ENCOUNTER — Inpatient Hospital Stay (HOSPITAL_COMMUNITY)
Admission: AD | Admit: 2015-01-15 | Discharge: 2015-01-21 | DRG: 640 | Disposition: A | Discharge status: Hospice | Payer: BC Managed Care – PPO | Source: Ambulatory Visit | Attending: Internal Medicine | Admitting: Internal Medicine

## 2015-01-15 ENCOUNTER — Telehealth: Payer: Self-pay | Admitting: *Deleted

## 2015-01-15 DIAGNOSIS — Z880 Allergy status to penicillin: Secondary | ICD-10-CM | POA: Diagnosis not present

## 2015-01-15 DIAGNOSIS — Z79891 Long term (current) use of opiate analgesic: Secondary | ICD-10-CM | POA: Diagnosis not present

## 2015-01-15 DIAGNOSIS — Z800 Family history of malignant neoplasm of digestive organs: Secondary | ICD-10-CM | POA: Diagnosis not present

## 2015-01-15 DIAGNOSIS — E43 Unspecified severe protein-calorie malnutrition: Secondary | ICD-10-CM | POA: Diagnosis present

## 2015-01-15 DIAGNOSIS — D61810 Antineoplastic chemotherapy induced pancytopenia: Secondary | ICD-10-CM | POA: Diagnosis present

## 2015-01-15 DIAGNOSIS — M1990 Unspecified osteoarthritis, unspecified site: Secondary | ICD-10-CM | POA: Diagnosis present

## 2015-01-15 DIAGNOSIS — Z7189 Other specified counseling: Secondary | ICD-10-CM | POA: Diagnosis not present

## 2015-01-15 DIAGNOSIS — Z7951 Long term (current) use of inhaled steroids: Secondary | ICD-10-CM | POA: Diagnosis not present

## 2015-01-15 DIAGNOSIS — D630 Anemia in neoplastic disease: Secondary | ICD-10-CM | POA: Diagnosis present

## 2015-01-15 DIAGNOSIS — Z79899 Other long term (current) drug therapy: Secondary | ICD-10-CM | POA: Diagnosis not present

## 2015-01-15 DIAGNOSIS — Z86718 Personal history of other venous thrombosis and embolism: Secondary | ICD-10-CM

## 2015-01-15 DIAGNOSIS — A0472 Enterocolitis due to Clostridium difficile, not specified as recurrent: Secondary | ICD-10-CM | POA: Diagnosis present

## 2015-01-15 DIAGNOSIS — I5022 Chronic systolic (congestive) heart failure: Secondary | ICD-10-CM | POA: Diagnosis present

## 2015-01-15 DIAGNOSIS — R439 Unspecified disturbances of smell and taste: Secondary | ICD-10-CM | POA: Diagnosis not present

## 2015-01-15 DIAGNOSIS — R532 Functional quadriplegia: Secondary | ICD-10-CM | POA: Diagnosis present

## 2015-01-15 DIAGNOSIS — Z9011 Acquired absence of right breast and nipple: Secondary | ICD-10-CM | POA: Diagnosis present

## 2015-01-15 DIAGNOSIS — R509 Fever, unspecified: Secondary | ICD-10-CM

## 2015-01-15 DIAGNOSIS — K5900 Constipation, unspecified: Secondary | ICD-10-CM

## 2015-01-15 DIAGNOSIS — G9341 Metabolic encephalopathy: Secondary | ICD-10-CM | POA: Diagnosis present

## 2015-01-15 DIAGNOSIS — E860 Dehydration: Secondary | ICD-10-CM | POA: Diagnosis present

## 2015-01-15 DIAGNOSIS — C50919 Malignant neoplasm of unspecified site of unspecified female breast: Secondary | ICD-10-CM | POA: Diagnosis present

## 2015-01-15 DIAGNOSIS — C50411 Malignant neoplasm of upper-outer quadrant of right female breast: Secondary | ICD-10-CM | POA: Diagnosis present

## 2015-01-15 DIAGNOSIS — C7801 Secondary malignant neoplasm of right lung: Secondary | ICD-10-CM

## 2015-01-15 DIAGNOSIS — C7931 Secondary malignant neoplasm of brain: Secondary | ICD-10-CM | POA: Diagnosis not present

## 2015-01-15 DIAGNOSIS — T380X5A Adverse effect of glucocorticoids and synthetic analogues, initial encounter: Secondary | ICD-10-CM | POA: Diagnosis present

## 2015-01-15 DIAGNOSIS — Z8052 Family history of malignant neoplasm of bladder: Secondary | ICD-10-CM

## 2015-01-15 DIAGNOSIS — R627 Adult failure to thrive: Secondary | ICD-10-CM | POA: Diagnosis present

## 2015-01-15 DIAGNOSIS — R740 Nonspecific elevation of levels of transaminase and lactic acid dehydrogenase [LDH]: Secondary | ICD-10-CM | POA: Diagnosis present

## 2015-01-15 DIAGNOSIS — T451X5A Adverse effect of antineoplastic and immunosuppressive drugs, initial encounter: Secondary | ICD-10-CM | POA: Diagnosis present

## 2015-01-15 DIAGNOSIS — G893 Neoplasm related pain (acute) (chronic): Secondary | ICD-10-CM | POA: Diagnosis not present

## 2015-01-15 DIAGNOSIS — Z923 Personal history of irradiation: Secondary | ICD-10-CM

## 2015-01-15 DIAGNOSIS — A047 Enterocolitis due to Clostridium difficile: Secondary | ICD-10-CM | POA: Diagnosis present

## 2015-01-15 DIAGNOSIS — C7800 Secondary malignant neoplasm of unspecified lung: Secondary | ICD-10-CM | POA: Diagnosis present

## 2015-01-15 DIAGNOSIS — C7951 Secondary malignant neoplasm of bone: Secondary | ICD-10-CM | POA: Diagnosis present

## 2015-01-15 DIAGNOSIS — K219 Gastro-esophageal reflux disease without esophagitis: Secondary | ICD-10-CM | POA: Diagnosis present

## 2015-01-15 DIAGNOSIS — R630 Anorexia: Secondary | ICD-10-CM | POA: Diagnosis not present

## 2015-01-15 DIAGNOSIS — Z885 Allergy status to narcotic agent status: Secondary | ICD-10-CM | POA: Diagnosis not present

## 2015-01-15 DIAGNOSIS — C787 Secondary malignant neoplasm of liver and intrahepatic bile duct: Secondary | ICD-10-CM | POA: Diagnosis present

## 2015-01-15 DIAGNOSIS — Z515 Encounter for palliative care: Secondary | ICD-10-CM | POA: Diagnosis not present

## 2015-01-15 DIAGNOSIS — C50911 Malignant neoplasm of unspecified site of right female breast: Secondary | ICD-10-CM | POA: Diagnosis not present

## 2015-01-15 DIAGNOSIS — Z7901 Long term (current) use of anticoagulants: Secondary | ICD-10-CM | POA: Diagnosis not present

## 2015-01-15 DIAGNOSIS — Z6824 Body mass index (BMI) 24.0-24.9, adult: Secondary | ICD-10-CM | POA: Diagnosis not present

## 2015-01-15 DIAGNOSIS — R531 Weakness: Secondary | ICD-10-CM | POA: Diagnosis present

## 2015-01-15 DIAGNOSIS — Z7401 Bed confinement status: Secondary | ICD-10-CM | POA: Diagnosis not present

## 2015-01-15 DIAGNOSIS — B379 Candidiasis, unspecified: Secondary | ICD-10-CM | POA: Diagnosis present

## 2015-01-15 DIAGNOSIS — D0590 Unspecified type of carcinoma in situ of unspecified breast: Secondary | ICD-10-CM

## 2015-01-15 LAB — COMPREHENSIVE METABOLIC PANEL
ALBUMIN: 2.7 g/dL — AB (ref 3.5–5.0)
ALT: 120 U/L — ABNORMAL HIGH (ref 14–54)
AST: 119 U/L — AB (ref 15–41)
Alkaline Phosphatase: 940 U/L — ABNORMAL HIGH (ref 38–126)
Anion gap: 12 (ref 5–15)
BUN: 15 mg/dL (ref 6–20)
CHLORIDE: 101 mmol/L (ref 101–111)
CO2: 25 mmol/L (ref 22–32)
Calcium: 9.8 mg/dL (ref 8.9–10.3)
Creatinine, Ser: 0.53 mg/dL (ref 0.44–1.00)
GFR calc Af Amer: >60 mL/min (ref >60)
Glucose, Bld: 133 mg/dL — ABNORMAL HIGH (ref 65–99)
POTASSIUM: 4.2 mmol/L (ref 3.5–5.1)
SODIUM: 138 mmol/L (ref 135–145)
Total Bilirubin: 0.9 mg/dL (ref 0.3–1.2)
Total Protein: 6.8 g/dL (ref 6.5–8.1)

## 2015-01-15 LAB — CBC WITH DIFFERENTIAL/PLATELET
Basophils Absolute: 0 10*3/uL (ref 0.0–0.1)
Basophils Relative: 0 % (ref 0–1)
EOS PCT: 0 % (ref 0–5)
Eosinophils Absolute: 0 10*3/uL (ref 0.0–0.7)
HCT: 40 % (ref 36.0–46.0)
HEMOGLOBIN: 12.9 g/dL (ref 12.0–15.0)
LYMPHS ABS: 0.4 10*3/uL — AB (ref 0.7–4.0)
LYMPHS PCT: 2 % — AB (ref 12–46)
MCH: 32.2 pg (ref 26.0–34.0)
MCHC: 32.3 g/dL (ref 30.0–36.0)
MCV: 99.8 fL (ref 78.0–100.0)
MONOS PCT: 5 % (ref 3–12)
Monocytes Absolute: 1 10*3/uL (ref 0.1–1.0)
Neutro Abs: 21.7 10*3/uL — ABNORMAL HIGH (ref 1.7–7.7)
Neutrophils Relative %: 93 % — ABNORMAL HIGH (ref 43–77)
PLATELETS: 262 10*3/uL (ref 150–400)
RBC: 4.01 MIL/uL (ref 3.87–5.11)
RDW: 16.8 % — ABNORMAL HIGH (ref 11.5–15.5)
WBC: 23.2 10*3/uL — AB (ref 4.0–10.5)

## 2015-01-15 MED ORDER — SENNOSIDES-DOCUSATE SODIUM 8.6-50 MG PO TABS: 1.0000 | ORAL_TABLET | Freq: Every evening | ORAL | Status: DC | PRN

## 2015-01-15 MED ORDER — METHYLPHENIDATE HCL 5 MG PO TABS
5.0000 mg | ORAL_TABLET | Freq: Every day | ORAL | Status: DC
  Administered 2015-01-16 – 2015-01-21 (×6): 5 mg via ORAL
  Filled 2015-01-15 (×6): qty 1

## 2015-01-15 MED ORDER — VANCOMYCIN HCL 125 MG PO CAPS
125.0000 mg | ORAL_CAPSULE | Freq: Four times a day (QID) | ORAL | Status: DC
  Filled 2015-01-15 (×2): qty 1

## 2015-01-15 MED ORDER — HYDROMORPHONE HCL 4 MG PO TABS
6.0000 mg | ORAL_TABLET | ORAL | Status: DC | PRN
  Administered 2015-01-15 – 2015-01-16 (×2): 6 mg via ORAL
  Filled 2015-01-15 (×4): qty 1

## 2015-01-15 MED ORDER — MIRTAZAPINE 15 MG PO TABS
30.0000 mg | ORAL_TABLET | Freq: Every day | ORAL | Status: DC
  Administered 2015-01-15 – 2015-01-20 (×6): 30 mg via ORAL
  Filled 2015-01-15 (×6): qty 2

## 2015-01-15 MED ORDER — ACETAMINOPHEN 650 MG RE SUPP: 650.0000 mg | Freq: Four times a day (QID) | RECTAL | Status: DC | PRN

## 2015-01-15 MED ORDER — ONDANSETRON HCL 4 MG/2ML IJ SOLN: 4.0000 mg | Freq: Four times a day (QID) | INTRAMUSCULAR | Status: DC | PRN

## 2015-01-15 MED ORDER — DEXAMETHASONE 4 MG PO TABS
8.0000 mg | ORAL_TABLET | Freq: Two times a day (BID) | ORAL | Status: DC
  Administered 2015-01-15 – 2015-01-19 (×8): 8 mg via ORAL
  Filled 2015-01-15 (×10): qty 2

## 2015-01-15 MED ORDER — PANTOPRAZOLE SODIUM 40 MG PO TBEC: 40.0000 mg | DELAYED_RELEASE_TABLET | Freq: Every day | ORAL | Status: DC

## 2015-01-15 MED ORDER — ONDANSETRON HCL 4 MG PO TABS: 4.0000 mg | ORAL_TABLET | Freq: Four times a day (QID) | ORAL | Status: DC | PRN

## 2015-01-15 MED ORDER — SODIUM CHLORIDE 0.9 % IV SOLN
INTRAVENOUS | Status: DC
  Administered 2015-01-15 – 2015-01-20 (×8): via INTRAVENOUS

## 2015-01-15 MED ORDER — VITAMIN D 1000 UNITS PO TABS
1000.0000 [IU] | ORAL_TABLET | Freq: Every day | ORAL | Status: DC
  Administered 2015-01-15 – 2015-01-21 (×7): 1000 [IU] via ORAL
  Filled 2015-01-15 (×7): qty 1

## 2015-01-15 MED ORDER — ACETAMINOPHEN 325 MG PO TABS
650.0000 mg | ORAL_TABLET | Freq: Four times a day (QID) | ORAL | Status: DC | PRN
  Filled 2015-01-15: qty 2

## 2015-01-15 MED ORDER — PROBIOTIC PO CAPS: ORAL_CAPSULE | Freq: Every day | ORAL | Status: DC

## 2015-01-15 MED ORDER — SODIUM CHLORIDE 0.9 % IJ SOLN
10.0000 mL | Freq: Two times a day (BID) | INTRAMUSCULAR | Status: DC
  Administered 2015-01-16 – 2015-01-18 (×3): 10 mL

## 2015-01-15 MED ORDER — SACCHAROMYCES BOULARDII 250 MG PO CAPS
250.0000 mg | ORAL_CAPSULE | Freq: Every day | ORAL | Status: DC
  Administered 2015-01-15 – 2015-01-21 (×7): 250 mg via ORAL
  Filled 2015-01-15 (×7): qty 1

## 2015-01-15 MED ORDER — ENSURE ENLIVE PO LIQD
237.0000 mL | Freq: Two times a day (BID) | ORAL | Status: DC
  Administered 2015-01-16 – 2015-01-17 (×4): 237 mL via ORAL

## 2015-01-15 MED ORDER — VANCOMYCIN 50 MG/ML ORAL SOLUTION
125.0000 mg | Freq: Four times a day (QID) | ORAL | Status: DC
  Administered 2015-01-15 – 2015-01-16 (×3): 125 mg via ORAL
  Filled 2015-01-15 (×7): qty 2.5

## 2015-01-15 MED ORDER — ENOXAPARIN SODIUM 100 MG/ML ~~LOC~~ SOLN
95.0000 mg | SUBCUTANEOUS | Status: DC
  Administered 2015-01-15 – 2015-01-21 (×7): 95 mg via SUBCUTANEOUS
  Filled 2015-01-15 (×7): qty 1

## 2015-01-15 MED ORDER — SODIUM CHLORIDE 0.9 % IJ SOLN
10.0000 mL | INTRAMUSCULAR | Status: DC | PRN
  Administered 2015-01-15: 10 mL

## 2015-01-15 MED ORDER — LEVETIRACETAM 500 MG PO TABS
1000.0000 mg | ORAL_TABLET | Freq: Two times a day (BID) | ORAL | Status: DC
  Administered 2015-01-15 – 2015-01-21 (×12): 1000 mg via ORAL
  Filled 2015-01-15 (×12): qty 2

## 2015-01-15 MED ORDER — ACYCLOVIR 400 MG PO TABS
400.0000 mg | ORAL_TABLET | Freq: Two times a day (BID) | ORAL | Status: DC
  Administered 2015-01-15 – 2015-01-21 (×12): 400 mg via ORAL
  Filled 2015-01-15 (×12): qty 1

## 2015-01-15 MED ORDER — FLUCONAZOLE 100 MG PO TABS
100.0000 mg | ORAL_TABLET | Freq: Every day | ORAL | Status: DC
  Administered 2015-01-16 – 2015-01-19 (×4): 100 mg via ORAL
  Filled 2015-01-15 (×4): qty 1

## 2015-01-15 NOTE — Progress Notes (Signed)
Patient has multiple skin tears on Right and left arms. RN cleansed with saline and applied vaseline gauze or tegaderm.  Patient also has skin tear on right leg. braden interventions in place.  Carrie Berry. Brigitte Pulse, RN

## 2015-01-15 NOTE — Progress Notes (Unsigned)
Courtesy note:  Carrie Berry's husband Carrie Berry called me last night saying that Carrie Berry was more confused, staying up all night, unable to participate in her activities of daily living, and basically he did not think she could stay safely at home. I suggested he increase the Decadron from 2 mg daily to 8 mg twice a day and see if that helped. I spoke with Carrie Berry again this morning and he set the patient was really no better. She could not walk. She was not eating. We agreed that she would need to be admitted for further evaluation, stabilization, and likely placement.  I appreciate Dr. Jerilee Hoh' help and her care of this patient!  After setting of the admission I spoke with Carrie Berry again. I could not be more direct. I told him that I did not expect Carrie Berry to get any better but rather that she was going to continue to get worse. I did not think she has long to live, and I strongly suggested they accept full hospice at home and if not possible then [facility]. He understands in my opinion she should be a full DO NOT RESUSCITATE (she did agree to a limited code last admission, with no intubation or CPR).  Carrie Berry tells me they did meet with hospice today. They however do not wish to participate in full hospice but only in the "in home palliative care" portion.  I think it will be helpful again this admission to request a palliative care consult so that goals of treatment and future interventions can be again discussed.  I am copying below a summary of her prior evaluations and treatments:  64 y.o. Carrie Berry, [place] woman status post right breast upper outer quadrant and right axillary lymph node biopsy 12/23/2012 for a clinical T3 N1 M1, stage IV invasive ductal carcinoma, grade 3, triple negative, with an MIB-1 of 86%.  (1) right liver lobe biopsy 01/21/2013 confirms metastatic adenocarcinoma; scans show involvement of the liver, lungs, and likely bone.  (2) enrolled in [facility] study [id number] (docetaxel + oral gamma secretase inibitor  [id number]), received one cycle starting 02/04/2013 but withdrew because of poor tolerance despite treatment interruptions and decreased dosing of the oral component  (3) chest CT in early November 2014 was compared with studies in August 2014 and showed interval progression of metastatic breast cancer, with an enlarging primary right breast mass, enlarging right axillary lymph nodes, enlarging pulmonary nodules and new/enlarging hepatic metastases.  (4) Abraxane started 03/19/2013, given day 1 and day 8 of each 21 day cycle, stopped with the day 1 cycle 3 dose (04/29/2013) because of local progression of disease  (5) cyclophosphamide and doxorubicin started 05/20/2013, given in dose dense fashion with Neulasta support on day 2. Completed 4 cycles 07/03/2013, with the final cycle dose reduced 15% because of an episode of febrile neutropenia after cycle 3. Restaging studies 07/15/2013 showed partial response.  (6) started capecitabine April 2015, 1.5 g by mouth twice a day, 7 days on 7 days off--discontinued October 2015 with progression  (7) s/p Right simple mastectomy 02/17/2014 to optimize local control, the pathology ([facility] [id number]) showing an invasive ductal carcinoma measuring 4.8 cm, grade 3, with close but negative margins and evidence of treatment response  (a) nodule on right mastectomy scar noted 04/03/2014, removed 04/14/2014 (b) new nodule noted on R chest scar 05/19/2014 (c) radiation to R chest wall completed 07/22/2014  (8) multiple brain metastases noted on brain MRI 02/26/2014--s/p whole brain irradiation completed 03/17/2014 (a) repeat brain MRI 05/12/2014 shows favorable  response (b) repeat brain MRI 07/15/2014 shows questionable growth/ stable disease  (c) repeat brain MRI 01/12/2015 shows 10 intracranial metastatic lesions without significant progression compared to June 2016  (9) protein-calorie  malnutrition  (10) high fall risk  (11) advanced directives: not in place; husband is healthcare power of attorney  (a) patient agreed to a limited code (no CPR, no intubation) during her more recent admission  (12) unexplained paroxysmal knee pain: possible ruptured Baker's cyst, Left, 05/01/2014  (13) doxil started 05/28/2014, repeated every 28 days. Cycle 3 delayed 2 weeks because of decrease in ejection fraction, which proved spurious (MUGA results 63% EF). Doxil resumed 04//11/2014 with evidence of response. Doxil discontinued after 11/25/2014 dose, with evidence of progression (a) MRI of the liver 11/02/2014 shows stable disease  (b) CT scan of the abdomen and pelvis 12/27/2014 shows progressive disease in the liver and lungs  (14) c difficile colitis 11/12/2014, s/p metronidazole with recurrence, continuing oral vancomycin  (15) nonocclusive deep vein thromboses noted 12/27/2014 on CT scan of the abdomen and pelvis  (a) on Lovenox subcutaneously  (16) Terminal care: The patient is unlikely to derive any benefit from further chemotherapy, but is very likely to experience only toxicity and side effects which could shorten instead of lengthening her life. Accordingly at this point no further life prolonging treatments are planned. A comfort care only approach would be appropriate, if the patient and her husband were agreeable.

## 2015-01-15 NOTE — H&P (Signed)
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 [name] was requested today. Patient has been getting weaker at home, has not been eating or drinking much, is having difficulty getting out of bed and ambulating. Per husband, Carrie Berry, she has also been very confused. Dr. [name] recommended increasing decadron from 2 to 8 mg, but without improvement overnight, admission to the hospital was requested. No labs are available at this time. No fever,chills,cough, localized pain. No seizure-like activity. Dr. [name] believes she is terminal and there no further treatments available to her. They have discussed hospice on multiple occasions, but Carrie Berry has been very reluctant.   Allergies:   Allergies  Allergen Reactions  . Codeine Nausea And Vomiting    "violently ill"  . Penicillins Rash    Childhood reaction -      Past Medical History  Diagnosis Date  . Recurrent sinus infections   . Arthritis   . PONV (postoperative nausea and vomiting)   . GERD (gastroesophageal reflux disease)   . 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 Anxiety     no problems at present  . Breast cancer   . 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 Radiation 07/16/14-07/22/14    Right chest wall nodule 20 Gy in 5 fractions  . Brain cancer     breast ca with brain mets  . 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 Tubal ligation  1985  . Portacath placement N/A 01/10/2013    Procedure: ULTRASOUND GUIDED PORT-A-CATH INSERTION WITH FLUOROSCOPY;  Surgeon: [name], MD;  Location: [facility];  Service: General;  Laterality: N/A;  . Esophagogastroduodenoscopy N/A 08/19/2013    Procedure: ESOPHAGOGASTRODUODENOSCOPY (EGD);   Surgeon: Garlan Fair, MD;  Location: [facility];  Service: Endoscopy;  Laterality: N/A;  . Total mastectomy Right 02/17/2014    Procedure: PALLIATIVE  RIGHT MASTECTOMY;  Surgeon: [name], MD;  Location: [facility];  Service: General;  Laterality: Right;    Prior to Admission medications   Medication Sig Start Date End Date Taking? Authorizing Provider  acetaminophen (TYLENOL) 500 MG tablet Take 1,000 mg by mouth every 6 (six) hours as needed for mild pain, moderate pain or headache.   Yes Historical Provider, MD  acyclovir (ZOVIRAX) 400 MG tablet Take 1 tablet (400 mg total) by mouth 2 (two) times daily. 07/22/14  Yes Chauncey Cruel, MD  cholecalciferol (VITAMIN D) 1000 UNITS tablet Take 1,000 Units by mouth daily.   Yes Historical Provider, MD  [employer] GAS RELIEF 80 MG chewable tablet CHEW 1 TABLET BY MOUTH EVERY 6 (SIX) HOURS AS NEEDED FOR FLATULENCE. Patient taking differently: Chew 1 tablet by mouth every 6 hours as needed for flatulence. 09/04/14  Yes Chauncey Cruel, MD  dexamethasone (DECADRON) 4 MG tablet Take 8 mg by mouth 2 (two) times daily with a meal.   Yes Historical Provider, MD  enoxaparin (LOVENOX) 100 MG/ML injection INJECT 0.95MLS (95MG  TOTAL) INTO THE SKIN DAILY Patient taking differently: Inject 0.68mls (95mg  total) into the skin daily. 11/30/14  Yes Chauncey Cruel, MD  fluconazole (  DIFLUCAN) 100 MG tablet Take 100 mg by mouth daily.   Yes Historical Provider, MD  HYDROmorphone (DILAUDID) 2 MG tablet Take 3 tablets (6 mg total) by mouth every 4 (four) hours as needed for severe pain. 12/23/14  Yes Laurie Panda, NP  levETIRAcetam (KEPPRA) 1000 MG tablet TAKE 1 TABLET (1,000 MG TOTAL) BY MOUTH EVERY 12 (TWELVE) HOURS. Patient taking differently: Take 1 tablet (1000 mg total) by mouth every 12 hours. 10/13/14  Yes Chauncey Cruel, MD  lidocaine-prilocaine (EMLA) cream Apply 1 application topically daily as needed (prior to port being access). Apply to affected area  once 11/14/14  Yes Venetia Maxon Rama, MD  methylphenidate (RITALIN) 5 MG tablet Take 1 tablet (5 mg total) by mouth 2 (two) times daily with breakfast and lunch. Patient taking differently: Take 5 mg by mouth daily.  12/23/14  Yes Laurie Panda, NP  mirtazapine (REMERON) 30 MG tablet Take 0.5 tablets (15 mg total) by mouth at bedtime. Patient taking differently: Take 30 mg by mouth at bedtime.  08/20/14  Yes Chauncey Cruel, MD  Multiple Vitamin (MULTIVITAMIN WITH MINERALS) TABS tablet Take 1 tablet by mouth daily.   Yes Historical Provider, MD  omeprazole (PRILOSEC) 20 MG capsule Take 20 mg by mouth daily.  12/20/14  Yes Historical Provider, MD  Probiotic Product (PROBIOTIC PO) Take 1 capsule by mouth daily.   Yes Historical Provider, MD  prochlorperazine (COMPAZINE) 10 MG tablet TAKE 1 TABLET (10 MG TOTAL) BY MOUTH EVERY 6 (SIX) HOURS AS NEEDED (NAUSEA OR VOMITING). Patient taking differently: Take 1 tablet (10 mg total) by mouth every 6 hours as needed (nausea or vomiting). 06/09/14  Yes Chauncey Cruel, MD  RA KRILL OIL PO Take 1 capsule by mouth daily.   Yes Historical Provider, MD  traZODone (DESYREL) 50 MG tablet TAKE 2 TABLETS BY MOUTH AT BEDTIME FOR SLEEP Patient taking differently: Take 100 mg by mouth at bedtime as needed for sleep.  12/23/14  Yes Laurie Panda, NP  vancomycin (VANCOCIN) 125 MG capsule Take 1 capsule (125 mg total) by mouth 4 (four) times daily. Patient taking differently: Take 125 mg by mouth 4 (four) times daily. For 17.5 days. 01/01/15 01/17/15 Yes Nita Sells, MD  sulfamethoxazole-trimethoprim (BACTRIM DS,SEPTRA DS) 800-160 MG per tablet Take 1 tablet by mouth every 12 (twelve) hours. Patient not taking: Reported on 01/13/2015 01/01/15   Nita Sells, MD    Social History:  reports that she has never smoked. She has never used smokeless tobacco. She reports that she does not drink alcohol or use illicit drugs.  Family History  Problem Relation Age of  Onset  . Bladder Cancer Father   . Colon cancer Maternal Grandmother     Review of Systems:  Unable to obtain as patient is lying in bed and seems reluctant to engage in conversation.  Physical Exam: Blood pressure 133/85, pulse 94, temperature 97.9 F (36.6 C), temperature source Oral, resp. rate 16, height 5\' 4"  (1.626 m), weight 64.7 kg (142 lb 10.2 oz), SpO2 98 %. Gen: awake, flat affect HEENT: /AT/PERRL, wears corrective lenses, dry mucous membranes Neck: supple, no JVD, no LAD, no goiter CV: RRR no M/R/G Abd: S/NT/ND/+BS Ext: no C/C/E/+ pulses Neuro: grossly intact and non-focal  Labs on Admission:  No results found for this or any previous visit (from the past 48 hour(s)).  Radiological Exams on Admission: No results found.  Assessment/Plan Principal Problem:   FTT (failure to thrive) in adult Active  Problems:   Breast cancer of right female breast s/p palliative mastectomy 02/17/14   Breast cancer metastasized to brain   Protein-calorie malnutrition, severe   C. difficile colitis    FTT in adult/metastatic breast cancer/severe protein-caloric malnutrition -Unfortunately, she has very advanced breast cancer, -Per her oncologist no further treatment options are available. -Dr. [name] has strongly recommended hospice, but husband is reluctant. He says today they signed up "for the palliative care piece but not the hospice piece". -Will need repeat palliative care consult to further assess GOC and to help spouse become comfortable with this decision. -Will request nutrition evaluation for her severe malnutrition, -Start on IVF. -Check CMET and CBC and UA to see if we can identify any cause for her increased weakness over the past 48 hours.  C Diff colitis -Continue treatment course. -Continues to have loose stools, no actual diarrhea.   Time Spent on Admission: 75 minutes  HERNANDEZ Berry,Carrie [facility] Pager: [phone] 01/15/2015, 3:36  PM

## 2015-01-15 NOTE — Telephone Encounter (Signed)
Message from spouse [name].  "Calling back as Dr. [name] requested.  There is no visible change in [place].  She wants to lie in bed, eyes closed.  She gets up to the bathroom and that's all."

## 2015-01-16 ENCOUNTER — Inpatient Hospital Stay (HOSPITAL_COMMUNITY): Payer: BC Managed Care – PPO

## 2015-01-16 MED ORDER — METOCLOPRAMIDE HCL 10 MG PO TABS
5.0000 mg | ORAL_TABLET | Freq: Three times a day (TID) | ORAL | Status: DC
  Administered 2015-01-16 – 2015-01-21 (×16): 5 mg via ORAL
  Filled 2015-01-16 (×16): qty 1

## 2015-01-16 MED ORDER — HYDROMORPHONE HCL 2 MG PO TABS
6.0000 mg | ORAL_TABLET | ORAL | Status: DC | PRN
  Administered 2015-01-16 – 2015-01-20 (×7): 6 mg via ORAL
  Filled 2015-01-16 (×14): qty 1

## 2015-01-16 MED ORDER — VANCOMYCIN 50 MG/ML ORAL SOLUTION
250.0000 mg | Freq: Four times a day (QID) | ORAL | Status: DC
  Administered 2015-01-16 – 2015-01-21 (×20): 250 mg via ORAL
  Filled 2015-01-16 (×24): qty 5

## 2015-01-16 NOTE — Progress Notes (Signed)
Patient Demographics:    Carrie Berry, is a 64 y.o. female, DOB - [birth year], OVF:[id number]  Admit date - 01/15/2015   Admitting Physician Erline Hau, MD  Outpatient Primary MD for the patient is Carrie Hams, MD  LOS - 1   No chief complaint on file.       Subjective:    Carrie Berry today has, No headache, No chest pain, No abdominal pain - No Nausea, No new weakness tingling or numbness, No Cough - SOB.     Assessment  & Plan :     1. Stage IV breast cancer with metastasis to brain, spine, lung and liver. Patient seen by Dr. Jana Berry yesterday, he recommended palliative care. Patient agreeable to hospice but husband is reluctant. Palliative care to evaluate the patient. Long-term prognosis extremely poor.    2.C. difficile colitis. Leukocytosis worse but could be due to increased dose Decadron, for now vancomycin oral, patient has had history of C. difficile colitis, will monitor stool frequency and consistency. She is also noted to be a C. difficile colonizer per husband.    3. Leukocytosis. Question etiology, recently placed on high-dose Decadron, check chest x-ray with abdominal film, repeat UA, monitor temperature curve. Treat C. difficile as above.    4. Failure to thrive. Continue Remeron to increase appetite, echo drawn should aid with appetite as well. Placed on protein supplementation.    5. Chronic systolic heart failure EF 40-45%. Appears to be compensated monitor closely.   6. GERD. Hold PPI due to C. Difficile.    Code Status partial  Family Communication  : Husband  Disposition Plan  : To be decided  Consults  :  Palliative care  Procedures  :   DVT Prophylaxis  :  Lovenox   Lab Results  Component Value Date   PLT 262 01/15/2015    Inpatient  Medications  Scheduled Meds: . acyclovir  400 mg Oral BID  . cholecalciferol  1,000 Units Oral Daily  . dexamethasone  8 mg Oral BID WC  . enoxaparin  95 mg Subcutaneous Q24H  . feeding supplement (ENSURE ENLIVE)  237 mL Oral BID BM  . fluconazole  100 mg Oral Daily  . levETIRAcetam  1,000 mg Oral BID  . methylphenidate  5 mg Oral Daily  . mirtazapine  30 mg Oral QHS  . saccharomyces boulardii  250 mg Oral Daily  . sodium chloride  10-40 mL Intracatheter Q12H  . vancomycin  250 mg Oral 4 times per day   Continuous Infusions: . sodium chloride 75 mL/hr at 01/16/15 0608   PRN Meds:.acetaminophen **OR** [DISCONTINUED] acetaminophen, HYDROmorphone, ondansetron **OR** ondansetron (ZOFRAN) IV, sodium chloride  Antibiotics  :    Anti-infectives    Start     Dose/Rate Route Frequency Ordered Stop   01/16/15 1200  vancomycin (VANCOCIN) 50 mg/mL oral solution 250 mg     250 mg Oral 4 times per day 01/16/15 0726     01/16/15 1000  fluconazole (DIFLUCAN) tablet 100 mg     100 mg Oral Daily 01/15/15 1536     01/15/15 2200  acyclovir (ZOVIRAX) tablet 400 mg     400 mg Oral 2 times daily 01/15/15 1536     01/15/15 1800  vancomycin (VANCOCIN) 125 MG capsule 125 mg  Status:  Discontinued     125 mg Oral 4 times daily 01/15/15 1536 01/15/15 1542   01/15/15 1800  vancomycin (VANCOCIN) 50 mg/mL oral solution 125 mg  Status:  Discontinued     125 mg Oral 4 times per day 01/15/15 1542 01/16/15 0726        Objective:   Filed Vitals:   01/15/15 1451 01/15/15 2109 01/16/15 0526  BP: 133/85 132/82 140/79  Pulse: 94 78 71  Temp: 97.9 F (36.6 C) 97.4 F (36.3 C) 98 F (36.7 C)  TempSrc: Oral Oral Oral  Resp: 16 17 18   Height: 5\' 4"  (1.626 m)    Weight: 64.7 kg (142 lb 10.2 oz)    SpO2: 98% 99% 98%    Wt Readings from Last 3 Encounters:  01/15/15 64.7 kg (142 lb 10.2 oz)  01/13/15 65.772 kg (145 lb)  01/12/15 65.772 kg (145 lb)     Intake/Output Summary (Last 24 hours) at 01/16/15  1238 Last data filed at 01/16/15 0700  Gross per 24 hour  Intake 1208.75 ml  Output    200 ml  Net 1008.75 ml     Physical Exam  Awake Alert, Oriented X 3, No new F.N deficits, Normal affect [place].AT,PERRAL Supple Neck,No JVD, No cervical lymphadenopathy appriciated.  Symmetrical Chest wall movement, Good air movement bilaterally, CTAB RRR,No Gallops,Rubs or new Murmurs, No Parasternal Heave +ve B.Sounds, Abd Soft, No tenderness, No organomegaly appriciated, No rebound - guarding or rigidity. No Cyanosis, Clubbing or edema, No new Rash or bruise      Data Review:   Micro Results No results found for this or any previous visit (from the past 240 hour(s)).  Radiology Reports Dg Chest 1 View  01/13/2015   CLINICAL DATA:  64 year old female with right side chest pain and fever since this morning. Current history of metastatic breast cancer. Subsequent encounter.  EXAM: CHEST  1 VIEW  COMPARISON:  12/27/2014 and earlier.  FINDINGS: Multiple bilateral lung nodules re- identified compatible with known pulmonary metastatic disease. These are largest at the left lung base as before. Suggestion of small left pleural effusion, or alternatively left pleural scarring. Stable cardiac size and mediastinal contours. Stable right chest porta cath. No pneumothorax or pulmonary edema. No consolidation.  IMPRESSION: No new cardiopulmonary abnormality. Pulmonary metastatic disease re- identified.   Electronically Signed   By: Genevie Ann M.D.   On: 01/13/2015 14:13   Dg Chest 2 View  12/27/2014   CLINICAL DATA:  Shortness of breath.  EXAM: CHEST  2 VIEW  COMPARISON:  November 10, 2014.  FINDINGS: Stable cardiomegaly. Right internal jugular Port-A-Cath is unchanged in position with distal tip overlying expected position of the SVC. No pneumothorax or pleural effusion is noted. Two nodules are noted in the left upper lobe concerning for metastatic disease. Stable blunting of left costophrenic sulcus is noted consistent  with small effusion or scarring. Small nodule is noted in right upper lobe.  IMPRESSION: Bilateral pulmonary nodules are noted concerning for metastatic disease. Stable blunting of left costophrenic sulcus consistent with scarring or small pleural effusion.   Electronically Signed   By: Marijo Conception, M.D.   On: 12/27/2014 14:25   Mr Jeri Cos XB Contrast  01/12/2015   CLINICAL DATA:  Breast cancer, stage IV. Whole-brain radiation completed 03/2014. Continued surveillance.  EXAM: MRI HEAD WITHOUT AND WITH CONTRAST  TECHNIQUE: Multiplanar, multiecho pulse sequences of the brain and surrounding structures were  obtained without and with intravenous contrast.  CONTRAST:  43mL MULTIHANCE GADOBENATE DIMEGLUMINE 529 MG/ML IV SOLN  COMPARISON:  MRI brain 10/30/2014  FINDINGS: Multiple intracranial metastatic deposits are redemonstrated. Within limits for assessment due to significant background post treatment leukoencephalopathy, no significant vasogenic edema.  - RIGHT cerebellum, 10 mm, image 39.  - LEFT occipital lobe, 2 mm,  image 65.  - RIGHT posterior temporal lobe, 9 mm, image 74.  - RIGHT thalamus, 2 mm, image 76.  - RIGHT occipital white matter, 3 mm, image 79.  - RIGHT periatrial white matter, 2 mm, image 83.  - RIGHT posterior centrum semiovale, 5 mm, image 94.  - RIGHT posterior frontal subcortical white matter, 3 mm, image 96.  -RIGHT superior frontal subcortical white matter, 2 mm, image 114.  -RIGHT superior frontal pole subcortical white matter, undetectable, image 120.  There are few lesions which were observed on the June scan, 1 mm in size, difficult to visualize on today's study.  Global atrophy. Severe white matter disease, stable. Tiny foci of chronic hemorrhage associated with treated lesions, stable. No osseous disease. No sinus air-fluid level. Negative orbits. Increased middle ear and mastoid fluid on the LEFT compared with priors, with suggestion of postcontrast enhancement. LEFT  Otitis/mastoiditis not excluded. Correlate clinically. Major dural venous sinuses are patent.  IMPRESSION: At least 10 intracranial metastatic lesions as described, without significant progression from June. A few punctate 1 mm lesions are now difficult to visualize at all.  Specifically, the new lesion which was detected on the June scan, 2 mm in the RIGHT thalamus, has not shown significant progression since that exam.   Electronically Signed   By: Staci Righter M.D.   On: 01/12/2015 13:33   Ct Abdomen Pelvis W Contrast  12/27/2014   CLINICAL DATA:  64 year old female on chemotherapy for metastatic breast cancer. Fever, abdominal pain, foul smelling urine. Initial encounter.  EXAM: CT ABDOMEN AND PELVIS WITH CONTRAST  TECHNIQUE: Multidetector CT imaging of the abdomen and pelvis was performed using the standard protocol following bolus administration of intravenous contrast.  CONTRAST:  128mL OMNIPAQUE IOHEXOL 300 MG/ML  SOLN  COMPARISON:  CT Abdomen and Pelvis 11/10/2014 and earlier  FINDINGS: Lower lobe lung metastases appear mildly progressed since June, now all ranging from 12-18 mm diameter (11-14 mm previously). Superimposed pulmonary atelectasis. No pericardial or pleural effusion.  There is a new mild superior endplate fracture of the L2 vertebral body. No overt metastatic disease identified in that vertebra. No destructive osseous lesion identified elsewhere.  Negative uterus and adnexa. No pelvic free fluid. Decompressed distal colon. There is mild bladder wall thickening and hyper enhancement compared to the prior study (series 2, image 82). No gas within the bladder. There is mild perivesical stranding.  Decompressed sigmoid colon. Negative left colon and distal transverse colon. Retained stool from the cecum to the proximal transverse colon. Negative terminal ileum. Appendix appears to remain normal. No dilated small bowel. Decompressed stomach and duodenum.  Numerous subcutaneous nodules in the  ventral abdominal wall, likely related to subcutaneous injections. Progressed hypodense liver lesions, increased in size and number since June. Confluent right lobe involvement. There may be some associated right lobe biliary dilatation, uncertain. Trace perihepatic free fluid. The portal venous system remains patent.  Negative gallbladder, spleen, pancreas and adrenal glands. Renal enhancement and contrast excretion within normal limits. Small retroperitoneal lymph nodes are stable.  There is evidence of nonocclusive thrombus in both external iliac veins and both common femoral veins, appears more  extensive on the left.  IMPRESSION: 1. Progressed pulmonary and hepatic metastatic disease since June. 2. Nonocclusive bilateral pelvic and femoral vein thrombus (nonocclusive DVTs). 3. Mild urinary bladder wall thickening and stranding compatible with urinary tract infection. 4. Acute to subacute L2 compression fracture which could be related to osteoporosis or metastatic disease.   Electronically Signed   By: Genevie Ann M.D.   On: 12/27/2014 16:22   US Abdomen Limited Ruq  12/27/2014   CLINICAL DATA:  Abdominal pain x2 days, elevated LFTs  EXAM: US ABDOMEN LIMITED - RIGHT UPPER QUADRANT  COMPARISON:  CT abdomen pelvis dated 12/27/2014.  FINDINGS: Gallbladder:  Contracted gallbladder. Secondary gallbladder wall thickening. No gallstones or pericholecystic fluid. Negative sonographic Murphy's sign.  Common bile duct:  Diameter: 2 mm.  Liver:  Heterogeneous appearance of the liver. Numerous ill-defined hypoechoic metastases are better visualized on recent CT abdomen pelvis.  IMPRESSION: Contracted gallbladder without associated sonographic findings to suggest acute cholecystitis.  Numerous ill-defined hypoechoic metastases, better evaluated on CT.   Electronically Signed   By: Julian Hy M.D.   On: 12/27/2014 18:02     CBC  Recent Labs Lab 01/13/15 1344 01/15/15 1626  WBC 13.9* 23.2*  HGB 12.0 12.9  HCT  37.9 40.0  PLT 232 262  MCV 100.5* 99.8  MCH 31.8 32.2  MCHC 31.7 32.3  RDW 17.4* 16.8*  LYMPHSABS 0.5* 0.4*  MONOABS 1.0 1.0  EOSABS 0.0 0.0  BASOSABS 0.0 0.0    Chemistries   Recent Labs Lab 01/13/15 1344 01/15/15 1626  NA 135 138  K 4.2 4.2  CL 102 101  CO2 24 25  GLUCOSE 113* 133*  BUN 15 15  CREATININE 0.47 0.53  CALCIUM 9.0 9.8  AST 128* 119*  ALT 119* 120*  ALKPHOS 876* 940*  BILITOT 0.9 0.9   ------------------------------------------------------------------------------------------------------------------ estimated creatinine clearance is 63.8 mL/min (by C-G formula based on Cr of 0.53). ------------------------------------------------------------------------------------------------------------------ No results for input(s): HGBA1C in the last 72 hours. ------------------------------------------------------------------------------------------------------------------ No results for input(s): CHOL, HDL, LDLCALC, TRIG, CHOLHDL, LDLDIRECT in the last 72 hours. ------------------------------------------------------------------------------------------------------------------ No results for input(s): TSH, T4TOTAL, T3FREE, THYROIDAB in the last 72 hours.  Invalid input(s): FREET3 ------------------------------------------------------------------------------------------------------------------ No results for input(s): VITAMINB12, FOLATE, FERRITIN, TIBC, IRON, RETICCTPCT in the last 72 hours.  Coagulation profile No results for input(s): INR, PROTIME in the last 168 hours.  No results for input(s): DDIMER in the last 72 hours.  Cardiac Enzymes No results for input(s): CKMB, TROPONINI, MYOGLOBIN in the last 168 hours.  Invalid input(s): CK ------------------------------------------------------------------------------------------------------------------ Invalid input(s): POCBNP   Time Spent in minutes  35   SINGH,PRASHANT K M.D on 01/16/2015 at 12:38  PM  Between 7am to 7pm - Pager - [phone]  After 7pm go to [email or web address] - password [facility]  [facility] -  Office  [phone]

## 2015-01-16 NOTE — Consult Note (Addendum)
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 [name]. She has been getting weaker at home, has not been eating or drinking much, is having difficulty getting out of bed and has been very confused. Dr. [name] recommended increasing decadron from 2 to 8 mg, but this did not immediately improve her symptoms and she was admitted to the hospital. Dr. [name] has discussed hospice on multiple occasions, but her husband has been very reluctant.   I met with Carrie Berry and her husband today. They report that she has been feeling poorly and really hope for her to feel better.  Patient and her husband have a good understanding of her illness trajectory, however her husband does not seem to be accepting of the fact that she is reaching the end of her life. During discussion, he reports that one of the patient's main goals has been to live until the end of October when she has a grandchild due to be born. He also reports it is very difficult that she has cancer at such a young age. They also had the tragedy of having a 59 year old son killed around 5 years ago. Her husband reports is all these things together that make it very difficult for him to process the fact that she is reaching end of her life.  We briefly discussed her goals of being comfortable, seeing family, and being at home. I told him that we need to continue to work on a plan to make sure that these were at the center of everything we do. We also discussed the fact that if there is no further disease modifying  therapy available, that does not mean that there is not treatment is available to his wife. I explained that the focus of our treatment should be on maintaining her functional status and giving her the best quality of life possible as this is the best way to lengthen her life if further disease modifying therapy is unlikely to benefit her.  I intentionally did not delve further into discussion as my main purpose today was to sit down to help understand where [name] and her husband are at in terms of their understanding of her disease and plan moving forward.  I will plan to follow-up tomorrow to continue conversation.  Contacts/Participants in Discussion: Primary Decision Maker: The patient and her husband  HCPOA: None on chart  Code Status/Advance Care Planning:  Patient is a partial code. No plans for intubation or CPR  Symptom Management:   Pain: She reports that has relief of her pain from Dilaudid but lasts only around 3 hours. Increased frequency of medication to Dilaudid 6 mg by mouth every 3 hours when necessary. Poor appetite: She appears to have some parts of early satiety. Will plan for trial of Reglan 5 mg 3 times per day 30 minutes prior to meals.She is already on Remeron.   Additional Recommendations (Limitations, Scope, Preferences): Is difficult social situation based upon her husband's reluctance to engage in conversation regarding end-of-life care. He reports that he has been on board with having palliative medicine  involved. He remains very reluctant to even discuss the option of hospice with the benefits that it may provide   Psycho-social/Spiritual:   Support System: Family  Desire for further Chaplaincy support:no  Prognosis: < 6 months  Discharge Planning:  To be determined. Recommend home with hospice if patient and family accepting       Chief Complaint/History of Present Illness:  64 year old female with metastatic breast cancer admitted for acute  worsening of symptoms  Primary Diagnoses  Present on Admission:  . FTT (failure to thrive) in adult . Breast cancer of right female breast s/p palliative mastectomy 02/17/14 . Breast cancer metastasized to brain . Protein-calorie malnutrition, severe . C. difficile colitis  Palliative Review of Systems:  Endorses Pain, fatigue, anorexia, nausea. I have reviewed the medical record, interviewed the patient and family, and examined the patient. The following aspects are pertinent.  Past Medical History  Diagnosis Date  . Recurrent sinus infections   . Arthritis   . PONV (postoperative nausea and vomiting)   . GERD (gastroesophageal reflux disease)   . 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 Anxiety     no problems at present  . Breast cancer   . 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 Radiation 07/16/14-07/22/14    Right chest wall nodule 20 Gy in 5 fractions  . Brain cancer     breast ca with brain mets  . 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 Marital Status: Married    Spouse Name: N/A  . Number of Children: N/A  . Years of Education: N/A   Social History Main Topics  . Smoking status: Never Smoker   . Smokeless tobacco: Never Used  . Alcohol Use: No  . Drug Use: No  . Sexual Activity: Not Currently    Birth Control/ Protection: Post-menopausal   Other Topics Concern  . None   Social History Narrative   Family History  Problem Relation Age of Onset  . Bladder Cancer Father   . Colon cancer Maternal Grandmother    Scheduled Meds: . acyclovir  400 mg Oral BID  . cholecalciferol  1,000 Units Oral Daily  . dexamethasone  8 mg Oral BID WC  . enoxaparin  95 mg Subcutaneous Q24H  . feeding supplement (ENSURE ENLIVE)  237 mL Oral BID BM  . fluconazole  100 mg Oral Daily  . levETIRAcetam  1,000 mg Oral BID  . methylphenidate  5 mg Oral Daily  . metoCLOPramide  5 mg Oral TID AC  .  mirtazapine  30 mg Oral QHS  . saccharomyces boulardii  250 mg Oral Daily  . sodium chloride  10-40 mL Intracatheter Q12H  . vancomycin  250 mg Oral 4 times per day   Continuous Infusions: . sodium chloride 75 mL/hr at 01/16/15 1916   PRN Meds:.acetaminophen **OR** [DISCONTINUED] acetaminophen, HYDROmorphone, ondansetron **OR** ondansetron (ZOFRAN) IV, sodium chloride Medications Prior to Admission:  Prior to Admission medications   Medication Sig Start Date End Date Taking? Authorizing Provider  acetaminophen (TYLENOL) 500 MG tablet Take 1,000 mg by mouth every 6 (six) hours as needed for mild pain, moderate pain or headache.   Yes Historical Provider, MD  acyclovir (ZOVIRAX) 400 MG tablet Take 1 tablet (400 mg total) by mouth 2 (two) times daily. 07/22/14  Yes Chauncey Cruel, MD  cholecalciferol (VITAMIN D) 1000 UNITS tablet Take 1,000 Units by mouth daily.   Yes  Historical Provider, MD  [employer] GAS RELIEF 80 MG chewable tablet CHEW 1 TABLET BY MOUTH EVERY 6 (SIX) HOURS AS NEEDED FOR FLATULENCE. Patient taking differently: Chew 1 tablet by mouth every 6 hours as needed for flatulence. 09/04/14  Yes Chauncey Cruel, MD  dexamethasone (DECADRON) 4 MG tablet Take 8 mg by mouth 2 (two) times daily with a meal.   Yes Historical Provider, MD  enoxaparin (LOVENOX) 100 MG/ML injection INJECT 0.95MLS ($RemoveBeforeD'95MG'HKLUVAsnWhdNfU$  TOTAL) INTO THE SKIN DAILY Patient taking differently: Inject 0.81mls ($RemoveBeforeD'95mg'qNIqxrdLcbSVDj$  total) into the skin daily. 11/30/14  Yes Chauncey Cruel, MD  fluconazole (DIFLUCAN) 100 MG tablet Take 100 mg by mouth daily.   Yes Historical Provider, MD  HYDROmorphone (DILAUDID) 2 MG tablet Take 3 tablets (6 mg total) by mouth every 4 (four) hours as needed for severe pain. 12/23/14  Yes Laurie Panda, NP  levETIRAcetam (KEPPRA) 1000 MG tablet TAKE 1 TABLET (1,000 MG TOTAL) BY MOUTH EVERY 12 (TWELVE) HOURS. Patient taking differently: Take 1 tablet (1000 mg total) by mouth every 12 hours. 10/13/14  Yes Chauncey Cruel, MD  lidocaine-prilocaine (EMLA) cream Apply 1 application topically daily as needed (prior to port being access). Apply to affected area once 11/14/14  Yes Venetia Maxon Rama, MD  methylphenidate (RITALIN) 5 MG tablet Take 1 tablet (5 mg total) by mouth 2 (two) times daily with breakfast and lunch. Patient taking differently: Take 5 mg by mouth daily.  12/23/14  Yes Laurie Panda, NP  mirtazapine (REMERON) 30 MG tablet Take 0.5 tablets (15 mg total) by mouth at bedtime. Patient taking differently: Take 30 mg by mouth at bedtime.  08/20/14  Yes Chauncey Cruel, MD  Multiple Vitamin (MULTIVITAMIN WITH MINERALS) TABS tablet Take 1 tablet by mouth daily.   Yes Historical Provider, MD  omeprazole (PRILOSEC) 20 MG capsule Take 20 mg by mouth daily.  12/20/14  Yes Historical Provider, MD  Probiotic Product (PROBIOTIC PO) Take 1 capsule by mouth daily.   Yes Historical Provider, MD  prochlorperazine (COMPAZINE) 10 MG tablet TAKE 1 TABLET (10 MG TOTAL) BY MOUTH EVERY 6 (SIX) HOURS AS NEEDED (NAUSEA OR VOMITING). Patient taking differently: Take 1 tablet (10 mg total) by mouth every 6 hours as needed (nausea or vomiting). 06/09/14  Yes Chauncey Cruel, MD  RA KRILL OIL PO Take 1 capsule by mouth daily.   Yes Historical Provider, MD  traZODone (DESYREL) 50 MG tablet TAKE 2 TABLETS BY MOUTH AT BEDTIME FOR SLEEP Patient taking differently: Take 100 mg by mouth at bedtime as needed for sleep.  12/23/14  Yes Laurie Panda, NP  vancomycin (VANCOCIN) 125 MG capsule Take 1 capsule (125 mg total) by mouth 4 (four) times daily. Patient taking differently: Take 125 mg by mouth 4 (four) times daily. For 17.5 days. 01/01/15 01/17/15 Yes Nita Sells, MD  sulfamethoxazole-trimethoprim (BACTRIM DS,SEPTRA DS) 800-160 MG per tablet Take 1 tablet by mouth every 12 (twelve) hours. Patient not taking: Reported on 01/13/2015 01/01/15   Nita Sells, MD   Allergies  Allergen Reactions  . Codeine Nausea And  Vomiting    "violently ill"  . Penicillins Rash    Childhood reaction -   CBC:    Component Value Date/Time   WBC 23.2* 01/15/2015 1626   WBC 9.4 12/23/2014 0940   HGB 12.9 01/15/2015 1626   HGB 13.3 12/23/2014 0940   HCT 40.0 01/15/2015 1626   HCT 40.3 12/23/2014 0940   PLT 262 01/15/2015 1626  PLT 234 12/23/2014 0940   MCV 99.8 01/15/2015 1626   MCV 102.0* 12/23/2014 0940   NEUTROABS 21.7* 01/15/2015 1626   NEUTROABS 6.6* 12/23/2014 0940   LYMPHSABS 0.4* 01/15/2015 1626   LYMPHSABS 1.4 12/23/2014 0940   MONOABS 1.0 01/15/2015 1626   MONOABS 1.4* 12/23/2014 0940   EOSABS 0.0 01/15/2015 1626   EOSABS 0.0 12/23/2014 0940   BASOSABS 0.0 01/15/2015 1626   BASOSABS 0.1 12/23/2014 0940   Comprehensive Metabolic Panel:    Component Value Date/Time   NA 138 01/15/2015 1626   NA 140 12/23/2014 0941   K 4.2 01/15/2015 1626   K 3.9 12/23/2014 0941   CL 101 01/15/2015 1626   CO2 25 01/15/2015 1626   CO2 25 12/23/2014 0941   BUN 15 01/15/2015 1626   BUN 13.3 12/23/2014 0941   CREATININE 0.53 01/15/2015 1626   CREATININE 0.7 12/23/2014 0941   GLUCOSE 133* 01/15/2015 1626   GLUCOSE 109 12/23/2014 0941   CALCIUM 9.8 01/15/2015 1626   CALCIUM 9.7 12/23/2014 0941   AST 119* 01/15/2015 1626   AST 120* 12/23/2014 0941   ALT 120* 01/15/2015 1626   ALT 140* 12/23/2014 0941   ALKPHOS 940* 01/15/2015 1626   ALKPHOS 838* 12/23/2014 0941   BILITOT 0.9 01/15/2015 1626   BILITOT 0.67 12/23/2014 0941   PROT 6.8 01/15/2015 1626   PROT 6.7 12/23/2014 0941   ALBUMIN 2.7* 01/15/2015 1626   ALBUMIN 2.7* 12/23/2014 0941    Physical Exam: Vital Signs: BP 125/67 mmHg  Pulse 98  Temp(Src) 99.5 F (37.5 C) (Oral)  Resp 20  Ht $R'5\' 4"'vt$  (1.626 m)  Wt 64.7 kg (142 lb 10.2 oz)  BMI 24.47 kg/m2  SpO2 95% SpO2: SpO2: 95 % O2 Device: O2 Device: Not Delivered O2 Flow Rate:   Intake/output summary:  Intake/Output Summary (Last 24 hours) at 01/16/15 1948 Last data filed at 01/16/15 1800   Gross per 24 hour  Intake 2058.75 ml  Output    200 ml  Net 1858.75 ml   LBM: Last BM Date: 01/15/15 Baseline Weight: Weight: 64.7 kg (142 lb 10.2 oz) Most recent weight: Weight: 64.7 kg (142 lb 10.2 oz)  Exam Findings:  General: Cachectic, sleepy but awakens easily, in no acute distress.  HEENT: No bruits, no goiter, no JVD Heart: Regular rate and rhythm. No murmur appreciated. Lungs: air  air movement, clear Abdomen: Soft, nontender, nondistended, positive bowel sounds.  Ext: No significant edema Skin: Warm and dry Neuro: Grossly intact, nonfocal.         Palliative Performance Scale: 40              Additional Data Reviewed: Recent Labs     01/15/15  1626  WBC  23.2*  HGB  12.9  PLT  262  NA  138  BUN  15  CREATININE  0.53     Time In: 1145 Time Out: 1300 Time Total: 75 Greater than 50%  of this time was spent counseling and coordinating care related to the above assessment and plan.  Signed by: Micheline Rough, MD  Micheline Rough, MD  01/16/2015, 7:48 PM  Please contact Palliative Medicine Team phone at [phone] for questions and concerns.

## 2015-01-17 DIAGNOSIS — Z515 Encounter for palliative care: Secondary | ICD-10-CM | POA: Insufficient documentation

## 2015-01-17 DIAGNOSIS — Z7189 Other specified counseling: Secondary | ICD-10-CM | POA: Insufficient documentation

## 2015-01-17 LAB — CBC
HCT: 37 % (ref 36.0–46.0)
Hemoglobin: 11.7 g/dL — ABNORMAL LOW (ref 12.0–15.0)
MCH: 32.1 pg (ref 26.0–34.0)
MCHC: 31.6 g/dL (ref 30.0–36.0)
MCV: 101.4 fL — AB (ref 78.0–100.0)
PLATELETS: 190 10*3/uL (ref 150–400)
RBC: 3.65 MIL/uL — AB (ref 3.87–5.11)
RDW: 17 % — AB (ref 11.5–15.5)
WBC: 15.2 10*3/uL — AB (ref 4.0–10.5)

## 2015-01-17 LAB — URINALYSIS, ROUTINE W REFLEX MICROSCOPIC
BILIRUBIN URINE: NEGATIVE
GLUCOSE, UA: NEGATIVE mg/dL
Hgb urine dipstick: NEGATIVE
KETONES UR: NEGATIVE mg/dL
Leukocytes, UA: NEGATIVE
Nitrite: NEGATIVE
PH: 6 (ref 5.0–8.0)
Protein, ur: NEGATIVE mg/dL
SPECIFIC GRAVITY, URINE: 1.019 (ref 1.005–1.030)
Urobilinogen, UA: 0.2 mg/dL (ref 0.0–1.0)

## 2015-01-17 LAB — BASIC METABOLIC PANEL
ANION GAP: 7 (ref 5–15)
BUN: 20 mg/dL (ref 6–20)
CALCIUM: 9.2 mg/dL (ref 8.9–10.3)
CO2: 25 mmol/L (ref 22–32)
Chloride: 107 mmol/L (ref 101–111)
Creatinine, Ser: 0.5 mg/dL (ref 0.44–1.00)
GFR calc Af Amer: >60 mL/min (ref >60)
GLUCOSE: 109 mg/dL — AB (ref 65–99)
Potassium: 4.1 mmol/L (ref 3.5–5.1)
SODIUM: 139 mmol/L (ref 135–145)

## 2015-01-17 LAB — MAGNESIUM: MAGNESIUM: 1.9 mg/dL (ref 1.7–2.4)

## 2015-01-17 MED ORDER — DRONABINOL 2.5 MG PO CAPS
5.0000 mg | ORAL_CAPSULE | Freq: Two times a day (BID) | ORAL | Status: DC
  Administered 2015-01-17 – 2015-01-20 (×7): 5 mg via ORAL
  Filled 2015-01-17 (×7): qty 2

## 2015-01-17 MED ORDER — ENSURE ENLIVE PO LIQD
237.0000 mL | Freq: Three times a day (TID) | ORAL | Status: DC
  Administered 2015-01-17 – 2015-01-21 (×10): 237 mL via ORAL

## 2015-01-17 NOTE — Progress Notes (Signed)
Daily Progress Note   Patient Name: Carrie Berry       Date: 01/17/2015 DOB: [birth year]  Age: 64 y.o. 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 Jana Berry. She has been getting weaker at home, has not been eating or drinking much, is having difficulty getting out of bed and has been very confused. Dr. Jana Berry recommended increasing decadron from 2 to 8 mg, but this did not immediately improve her symptoms and she was admitted to the hospital. Dr. Jana Berry has discussed hospice on multiple occasions, but her husband has been very reluctant.    Interval Events: Carrie Berry reports that she is feeling better today. She states that her nausea is improved and her pain has been fairly well controlled. She still endorses she does not have much of an appetite.  Her husband is hopeful that she may benefit from a trial of dronabinol.  We again discussed her overall clinical course moving forward. We specifically talked about the fact that she has been told there is no further disease modifying therapy that'll be beneficial to her. I again emphasized that this does not mean that there is not treatment available. Husband seemed in agreement the plan of care moving forward should focus on helping Carrie Berry have as much functional status is possible through aggressive symptom management.  We talked about hospitalizations and how admissions are useful if coming to the hospital adds time and quality to her life. We also talked about the fact that being at home and staying at home with support of hospice would be another option that will allow her to focus on her goals spending time with family, maintaining her comfort and feeling as good as possible, and being  outside of the hospital.  He seemed to be much more open to the fact that if hospice was focused on helping his wife live better through good symptom management, this would be a good way to focus on her stated goals.  - Patient reports she symptomatically feels better today - Patient and her husband seemed much more open to considering hospice support on discharge. Will continue to follow-up with family tomorrow.   Length of Stay: 2 days  Current Medications: Scheduled Meds:  . acyclovir  400 mg Oral BID  . cholecalciferol  1,000 Units Oral Daily  . dexamethasone  8 mg Oral BID WC  . dronabinol  5 mg Oral BID AC  . enoxaparin  95 mg Subcutaneous Q24H  . feeding supplement (ENSURE ENLIVE)  237 mL Oral TID BM  . fluconazole  100 mg Oral Daily  . levETIRAcetam  1,000 mg Oral BID  . methylphenidate  5 mg Oral Daily  . metoCLOPramide  5 mg Oral TID AC  . mirtazapine  30 mg Oral QHS  . saccharomyces boulardii  250 mg Oral Daily  . sodium chloride  10-40 mL Intracatheter Q12H  . vancomycin  250 mg Oral 4 times per day    Continuous Infusions: . sodium chloride 75 mL/hr at 01/17/15 0925    PRN Meds: acetaminophen **OR** [DISCONTINUED] acetaminophen, HYDROmorphone, ondansetron **OR** ondansetron (ZOFRAN) IV, sodium chloride  Palliative Performance Scale: 30-40%     Vital Signs: BP 135/66 mmHg  Pulse 81  Temp(Src) 98.4 F (36.9 C) (Oral)  Resp 20  Ht 5\' 4"  (1.626 m)  Wt 64.7 kg (142 lb 10.2 oz)  BMI 24.47 kg/m2  SpO2 98% SpO2: SpO2: 98 % O2 Device: O2 Device: Not Delivered O2 Flow Rate:    Intake/output summary:  Intake/Output Summary (Last 24 hours) at 01/17/15 1911 Last data filed at 01/17/15 1848  Gross per 24 hour  Intake   2040 ml  Output   1150 ml  Net    890 ml   LBM:   Baseline Weight: Weight: 64.7 kg (142 lb 10.2 oz) Most recent weight: Weight: 64.7 kg (142 lb 10.2 oz)  Physical Exam: General: Cachectic, sleepy but awakens easily, in no acute distress.   HEENT: No bruits, no goiter, no JVD Heart: Regular rate and rhythm. No murmur appreciated. Lungs: air air movement, clear Abdomen: Soft, nontender, nondistended, positive bowel sounds.  Ext: No significant edema Skin: Warm and dry Neuro: Grossly intact, nonfocal.            Additional Data Reviewed: Recent Labs     01/15/15  1626  01/17/15  0500  WBC  23.2*  15.2*  HGB  12.9  11.7*  PLT  262  190  NA  138  139  BUN  15  20  CREATININE  0.53  0.50     Problem List:  Patient Active Problem List   Diagnosis Date Noted  . Sepsis 12/27/2014  . Abdominal pain 12/27/2014  . Lumbar pain 12/23/2014  . Hyperphosphatemia 12/23/2014  . Transaminitis 12/23/2014  . Hypoalbuminemia 12/23/2014  . Long term current use of anticoagulant therapy 12/23/2014  . Clostridium difficile colitis 11/25/2014  . C. difficile colitis 11/13/2014  . Generalized weakness 11/10/2014  . Difficulty swallowing 10/15/2014  . DVT (deep venous thrombosis) 08/27/2014  . Back spasm 08/06/2014  . Skin breakdown 07/30/2014  . Elevated liver enzymes 07/30/2014  . Thrush 05/28/2014  . Protein-calorie malnutrition, severe 03/20/2014  . Breast cancer metastasized to brain 03/19/2014  . FTT (failure to thrive) in adult 03/19/2014  . Weakness 03/19/2014  . Lung metastases 03/12/2014  . Liver metastases 03/12/2014  . Constipation 03/12/2014  . Physical deconditioning 03/07/2014  . Diarrhea 03/07/2014  . Primary cancer of right breast with metastasis to other site 01/26/2014  . Rash, skin 10/13/2013  . Insomnia 10/13/2013  . Epiphora 04/29/2013  . GERD (gastroesophageal reflux disease) 03/19/2013  . Breast cancer of right female breast s/p palliative mastectomy 02/17/14 12/25/2012     Palliative Care Assessment & Plan    Code Status:  Limited code  Goals of Care:  Patient is not a candidate for further disease modifying therapy. Hospice has been recommended several times to family but husband has  been resistant to considering this in the past. Will continue to work with family on best plan for discharge in order to focus on patient's goals of living as long as possible as well as possible, spending time with family, And being outside hospital.  Symptom Management:  Pain: She reports that has relief of her pain from Dilaudid but lasts only around 3 hours. Increased frequency of medication to Dilaudid 6 mg by mouth every 3 hours when necessary. Poor appetite: Continue Reglan 5 mg 3 times per day 30 minutes prior to meals.She is already on Remeron. Agree with plan for trial dronabinol.  Psycho-social/Spiritual:  Desire for further Chaplaincy support:yes   Prognosis: < 6 months Discharge Planning: To be determined. Family seems more open to considering home with hospice at this time.   Care plan  was discussed with patient and her husband  Thank you for allowing the Palliative Medicine Team to assist in the care of this patient.   Time In: 1120 Time Out: 1200 Total Time 40 Prolonged Time Billed  no     Greater than 50%  of this time was spent counseling and coordinating care related to the above assessment and plan.   Micheline Rough, MD  01/17/2015, 7:11 PM  Please contact Palliative Medicine Team phone at [phone] for questions and concerns.

## 2015-01-17 NOTE — Progress Notes (Signed)
PT Cancellation Note  Patient Details Name: JODILYN GIESE MRN: [id number] DOB: 1951-04-07   Cancelled Treatment:    Reason Eval/Treat Not Completed: Other (comment) (RN reports +2 transfers only at this time, has been to [facility]. notes reviewed re: Palliative  Care/Hospice. will check back 9/5.)   Claretha Cooper 01/17/2015, 3:20 PM

## 2015-01-17 NOTE — Progress Notes (Addendum)
Patient ID: Carrie Berry, female   DOB: 1951/04/14, 64 y.o.   MRN: [id number]  [facility] PROGRESS NOTE  Carrie Berry JSH:[id number] DOB: 09-10-1950 DOA: 01/15/2015 PCP: [name], MD   Brief narrative:    64 y/o woman with metastatic breast cancer to brain, liver and lung for whom direct admission by Dr. [name] was requested for evaluation of progressive weakness, poor oral intake, FTT, confusion at home.  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  - has been on oral vancomycin - per husband, pt has not had any diarrhea - WBC could be elevated from Decadron - WBC is trending down, will repeat CBC in AM  Failure to thrive with severe PCM - in the setting of chronic illness, metastatic breast cancer  - nutritionist consulted - add Marinol to see if this will help with appetite stimulant   Chronic systolic heart failure EF 40-45% - Appears to be compensated monitor closely  Transaminitis - likely from hepatic metastases  - no need for trending   Anemia of chronic disease, malignancy, macrocytic  - no signs of active bleeding   Functional quadriplegia - bed bound for now due to progressive nature of malignancy - attempt PT/OT if pt able to tolerate   DVT prophylaxis - Lovenox SQ  Code Status: Partial code, no CPR or intubation but OK to place on pressors  Family Communication:  plan of care discussed with the husband at bedside  Disposition Plan: To be determined    IV access:  Peripheral IV  Procedures and diagnostic studies:     Dg Abd Acute W/chest 01/16/2015  No acute abnormality.  Unremarkable bowel gas pattern.  Pulmonary nodules/metastases again noted.    Medical Consultants:  PCT  Other Consultants:  PT OT Nutritionist   IAnti-Infectives:   Oral vancomycin  Diflucan   Faye Ramsay, MD  [facility] Pager [phone]  If 7PM-7AM, please contact night-coverage [email or web address] Password [email or web address] 01/17/2015, 12:43 PM   LOS: 2 days   HPI/Subjective: No events overnight.   Objective: Filed Vitals:   01/16/15 0526 01/16/15 1300 01/16/15 2112 01/17/15 0638  BP: 140/79 125/67 152/69 134/70  Pulse: 71 98 97 69  Temp: 98 F (36.7 C) 99.5 F (37.5 C) 97.3 F (36.3 C) 97.9 F (36.6 C)  TempSrc: Oral Oral Oral Oral  Resp: 18 20 20 20   Height:      Weight:      SpO2: 98% 95% 98% 97%    Intake/Output Summary (Last 24 hours) at 01/17/15 1243 Last data filed at 01/17/15 0900  Gross per 24 hour  Intake   1920 ml  Output    400 ml  Net   1520 ml    Exam:   General:  Pt is somnolent, easy to awake, NAD, rail and weak   Cardiovascular: Regular rate and rhythm, no rubs, no gallops  Respiratory: Clear to auscultation bilaterally, no wheezing, no crackles, no rhonchi  Abdomen: Soft, non tender, non  distended, bowel sounds present, no guarding  Data Reviewed: Basic Metabolic Panel:  Recent Labs Lab 01/13/15 1344 01/15/15 1626 01/17/15 0500  NA 135 138 139  K 4.2 4.2 4.1  CL 102 101 107  CO2 24 25 25   GLUCOSE 113* 133* 109*  BUN 15 15 20   CREATININE 0.47 0.53 0.50  CALCIUM 9.0 9.8 9.2  MG  --   --  1.9   Liver Function Tests:  Recent Labs Lab 01/13/15 1344 01/15/15 1626  AST 128* 119*  ALT 119* 120*  ALKPHOS 876* 940*  BILITOT 0.9 0.9  PROT 6.2* 6.8  ALBUMIN 2.6* 2.7*    Recent Labs Lab 01/13/15 1344  LIPASE 78*   CBC:  Recent Labs Lab 01/13/15 1344 01/15/15 1626 01/17/15 0500  WBC 13.9* 23.2*  15.2*  NEUTROABS 12.4* 21.7*  --   HGB 12.0 12.9 11.7*  HCT 37.9 40.0 37.0  MCV 100.5* 99.8 101.4*  PLT 232 262 190   Scheduled Meds: . acyclovir  400 mg Oral BID  . cholecalciferol  1,000 Units Oral Daily  . dexamethasone  8 mg Oral BID WC  . dronabinol  5 mg Oral BID AC  . enoxaparin  95 mg Subcutaneous Q24H  . feeding supplement (ENSURE ENLIVE)  237 mL Oral BID BM  . fluconazole  100 mg Oral Daily  . levETIRAcetam  1,000 mg Oral BID  . methylphenidate  5 mg Oral Daily  . metoCLOPramide  5 mg Oral TID AC  . mirtazapine  30 mg Oral QHS  . saccharomyces boulardii  250 mg Oral Daily  . sodium chloride  10-40 mL Intracatheter Q12H  . vancomycin  250 mg Oral 4 times per day   Continuous Infusions: . sodium chloride 75 mL/hr at 01/17/15 [phone]

## 2015-01-17 NOTE — Care Management Note (Signed)
Case Management Note  Patient Details  Name: Carrie Berry MRN: [id number] Date of Birth: Apr 08, 1951  Subjective/Objective:     Metastatic breast cancer               Action/Plan: Home Hospice referral, weekday NCM will follow up to arrange Home Hospice with family on 01/18/2015. Pt active with [facility] for [facility], [place] Crow for private duty aide.   Expected Discharge Date:   (unknown)               Expected Discharge Plan:  Home w Hospice Care  In-House Referral:     Discharge planning Services  CM Consult  Status of Service:  In process, will continue to follow  Medicare Important Message Given:    Date Medicare IM Given:    Medicare IM give by:    Date Additional Medicare IM Given:    Additional Medicare Important Message give by:     If discussed at [place] of Stay Meetings, dates discussed:    Additional Comments:  Erenest Rasher, RN 01/17/2015, 6:32 PM

## 2015-01-17 NOTE — Progress Notes (Signed)
Initial Nutrition Assessment  DOCUMENTATION CODES:   Severe malnutrition in context of chronic illness  INTERVENTION:   Continue Ensure Enlive po TID, each supplement provides 350 kcal and 20 grams of protein Encourage PO intake RD to continue to monitor  NUTRITION DIAGNOSIS:   Malnutrition related to chronic illness as evidenced by energy intake < or equal to 75% for > or equal to 1 month, percent weight loss.  GOAL:   Patient will meet greater than or equal to 90% of their needs  MONITOR:   PO intake, Supplement acceptance, Labs, Weight trends, Skin, I & O's  REASON FOR ASSESSMENT:   Consult Assessment of nutrition requirement/status  ASSESSMENT:   64 y/o woman with metastatic breast cancer to brain, liver and lung for whom direct admission by Dr. [name] was requested for evaluation of progressive weakness, poor oral intake, FTT, confusion at home.  Pt in room sleeping. RD spoke with RN, pt has been refusing to eat. Per RN, pt's family has tried to get patient to eat but she won't. Pt has been drinking her Ensure supplements. Pt to start Marinol today. RD will monitor to see if the appetite stimulant helps her PO intake.  PO intake: 0-20%.  Per weight history, pt has lost 9 lb since 8/15 (6% weight loss x 2 weeks, significant for time frame).  Labs reviewed: Mg WNL  Diet Order:  Diet regular Room service appropriate?: Yes; Fluid consistency:: Thin  Skin:  Wound (see comment) (arm and leg wounds)  Last BM:  9/2  Height:   Ht Readings from Last 1 Encounters:  01/15/15 5\' 4"  (1.626 m)    Weight:   Wt Readings from Last 1 Encounters:  01/15/15 142 lb 10.2 oz (64.7 kg)    Ideal Body Weight:  54.54 kg  BMI:  Body mass index is 24.47 kg/(m^2).  Estimated Nutritional Needs:   Kcal:  1950-2150  Protein:  95-105g  Fluid:  2L/day  EDUCATION NEEDS:   No education needs identified at this time  Clayton Bibles, MS, RD, LDN Pager: [phone] After Hours  Pager: [phone]

## 2015-01-18 LAB — BASIC METABOLIC PANEL
Anion gap: 6 (ref 5–15)
BUN: 18 mg/dL (ref 6–20)
CALCIUM: 9.1 mg/dL (ref 8.9–10.3)
CO2: 27 mmol/L (ref 22–32)
CREATININE: 0.47 mg/dL (ref 0.44–1.00)
Chloride: 105 mmol/L (ref 101–111)
GFR calc non Af Amer: >60 mL/min (ref >60)
Glucose, Bld: 126 mg/dL — ABNORMAL HIGH (ref 65–99)
Potassium: 4 mmol/L (ref 3.5–5.1)
SODIUM: 138 mmol/L (ref 135–145)

## 2015-01-18 LAB — CBC
HCT: 37.9 % (ref 36.0–46.0)
Hemoglobin: 11.6 g/dL — ABNORMAL LOW (ref 12.0–15.0)
MCH: 31 pg (ref 26.0–34.0)
MCHC: 30.6 g/dL (ref 30.0–36.0)
MCV: 101.3 fL — ABNORMAL HIGH (ref 78.0–100.0)
Platelets: 205 10*3/uL (ref 150–400)
RBC: 3.74 MIL/uL — ABNORMAL LOW (ref 3.87–5.11)
RDW: 16.9 % — AB (ref 11.5–15.5)
WBC: 15.2 10*3/uL — ABNORMAL HIGH (ref 4.0–10.5)

## 2015-01-18 LAB — URINE CULTURE

## 2015-01-18 NOTE — Progress Notes (Signed)
I met briefly with Carrie Berry today. She reports symptomatically she is feeling better but is very tired and wants to rest.  Her husband was not present to continue conversation regarding care plan moving forward.  We'll follow-up tomorrow.  Micheline Rough, MD [place] Team [phone]

## 2015-01-18 NOTE — Progress Notes (Signed)
OT Cancellation Note  Patient Details Name: DIONNA WIEDEMANN MRN: [id number] DOB: 05-09-1951   Cancelled Treatment:    Reason Eval/Treat Not Completed: Fatigue/lethargy limiting ability to participate  Will recheck on pt next day.  OT did encourage and A pt with taking a few bites of lunch Aayden Cefalu, Thereasa Parkin 01/18/2015, 1:19 PM

## 2015-01-18 NOTE — Evaluation (Signed)
Physical Therapy Evaluation Patient Details Name: Carrie Berry MRN: [id number] DOB: 1951/04/05 Today's Date: 01/18/2015   History of Present Illness  64 y.o. female admitted with confusion, decreased p.o. intake, FTT.  Pt with hx Breast CA with mets to liver and brain.  Clinical Impression  Pt admitted with above diagnosis. Pt currently with functional limitations due to the deficits listed below (see PT Problem List). Pt is lethargic and mobility is significantly limited by abdominal pain. She was unable to tolerate sitting on edge of bed more than 20 seconds due to pain. Pt currently not tolerating transfers or ambulation.  Hospital bed and WC ramp would be needed for DC home. SNF recommended if family unable to provide 33* care. Pt will benefit from skilled PT to increase their independence and safety with mobility to allow discharge to the venue listed below.       Follow Up Recommendations Home health PT;Supervision/Assistance - 24 hour;SNF (home with 24* assist vs. SNF if family unable to manage pt's care)    Equipment Recommendations  Hospital bed;Other (comment) (WC ramp)    Recommendations for Other Services OT consult     Precautions / Restrictions Precautions Precautions: Fall Precaution Comments: FRAGILE SKIN Restrictions Weight Bearing Restrictions: No      Mobility  Bed Mobility Overal bed mobility: Needs Assistance;+2 for physical assistance;+ 2 for safety/equipment Bed Mobility: Rolling;Sidelying to Sit Rolling: Min assist Sidelying to sit: Max assist       General bed mobility comments: verbal cues for technique, max A to raise trunk into sitting (pt 50%)  Transfers                    Ambulation/Gait     Assistive device: Rolling walker (2 wheeled)       General Gait Details: NT-pt couldn't tolerate sitting  Stairs            Wheelchair Mobility    Modified Rankin (Stroke Patients Only)       Balance     Sitting  balance-Leahy Scale: Fair (pt sat briefly (20 seconds) on EOB, reported increased pain and stated she couldn't tolerate sitting, assisted back to supine)                                       Pertinent Vitals/Pain Pain Score: 6  Pain Location: abdomen Pain Descriptors / Indicators: Sore Pain Intervention(s): Premedicated before session;Monitored during session;Limited activity within patient's tolerance;Repositioned    Home Living Family/patient expects to be discharged to:: Private residence Living Arrangements: Spouse/significant other Available Help at Discharge: Family;Available 24 hours/day Type of Home: House Home Access: Stairs to enter Entrance Stairs-Rails: Psychiatric nurse of Steps: 5 Home Layout: One level Home Equipment: Walker - 4 wheels;Cane - single point;Wheelchair - [employer];Shower seat      Prior Function Level of Independence: Needs assistance   Gait / Transfers Assistance Needed: uses a RW,  ADL's / Homemaking Assistance Needed: husband assisting with sponge bathing, dressing, meals, housework        Hand Dominance   Dominant Hand: Right    Extremity/Trunk Assessment   Upper Extremity Assessment: Generalized weakness           Lower Extremity Assessment: Generalized weakness (limited assessment due to pain and lethargy)      Cervical / Trunk Assessment: Normal  Communication   Communication: No difficulties  Cognition Arousal/Alertness: Lethargic  Behavior During Therapy: Flat affect;WFL for tasks assessed/performed Overall Cognitive Status: No family/caregiver present to determine baseline cognitive functioning                      General Comments      Exercises        Assessment/Plan    PT Assessment Patient needs continued PT services  PT Diagnosis Generalized weakness;Acute pain;Difficulty walking   PT Problem List Decreased strength;Decreased activity  tolerance;Pain;Decreased mobility  PT Treatment Interventions DME instruction;Gait training;Functional mobility training;Therapeutic activities;Therapeutic exercise;Patient/family education   PT Goals (Current goals can be found in the Care Plan section) Acute Rehab PT Goals Patient Stated Goal: Home with husband, with hospice PT Goal Formulation: With patient Time For Goal Achievement: 01/11/15 Potential to Achieve Goals: Fair    Frequency Min 3X/week   Barriers to discharge        Co-evaluation               End of Session   Activity Tolerance: Patient limited by fatigue;Patient limited by pain Patient left: in bed;with call bell/phone within reach;with bed alarm set;with nursing/sitter in room Nurse Communication: Mobility status         Time: 8250-0370 PT Time Calculation (min) (ACUTE ONLY): 13 min   Charges:   PT Evaluation $Initial PT Evaluation Tier I: 1 Procedure     PT G CodesPhilomena Doheny 01/18/2015, 10:06 AM [phone]

## 2015-01-18 NOTE — Progress Notes (Signed)
Patient ID: SUDA FORBESS, female   DOB: [birth year], 64 y.o.   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 [name] was requested for evaluation of progressive weakness, poor oral intake, FTT, confusion at home.  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  - has been on oral vancomycin - per husband, pt has not had any diarrhea - WBC could be elevated from Decadron - WBC is trending down, will repeat CBC in AM  Failure to thrive with severe PCM - in the setting of chronic illness, metastatic breast cancer  - nutritionist consulted - added Marinol  Chronic systolic heart failure EF 40-45% - Appears to be compensated monitor closely  Transaminitis - likely from hepatic metastases  - no need for trending   Anemia of chronic disease, malignancy, macrocytic  - no signs of active bleeding   Functional quadriplegia - bed bound for now due to progressive nature of malignancy - attempt PT/OT if pt able to tolerate   DVT prophylaxis - Lovenox   Code Status: Partial code, no CPR or intubation but OK to place on pressors  Family Communication:  plan of care discussed  with the husband at bedside  Disposition Plan: To be determined   IV access:  Peripheral IV  Procedures and diagnostic studies:     Dg Abd Acute W/chest 01/16/2015  No acute abnormality.  Unremarkable bowel gas pattern.  Pulmonary nodules/metastases again noted.    Medical Consultants:  PCT  Other Consultants:  PT OT Nutritionist   IAnti-Infectives:   Oral vancomycin  Diflucan   Faye Ramsay, MD  [facility] Pager [phone]  If 7PM-7AM, please contact night-coverage [email or web address] Password [email or web address] 01/18/2015, 8:07 AM   LOS: 3 days   HPI/Subjective: No events overnight.   Objective: Filed Vitals:   01/17/15 0638 01/17/15 1500 01/17/15 2209 01/18/15 0451  BP: 134/70 135/66 125/85 131/63  Pulse: 69 81 64 72  Temp: 97.9 F (36.6 C) 98.4 F (36.9 C) 97.9 F (36.6 C) 99 F (37.2 C)  TempSrc: Oral Oral Oral Oral  Resp: 20 20 20 20   Height:      Weight:      SpO2: 97% 98% 96% 99%    Intake/Output Summary (Last 24 hours) at 01/18/15 0807 Last data filed at 01/18/15 0748  Gross per 24 hour  Intake   2040 ml  Output    750 ml  Net   1290 ml    Exam:   General:  Pt is somnolent, easy to awake, NAD, frail and weak   Cardiovascular: Regular rate and rhythm, no rubs, no gallops  Respiratory: Clear to auscultation bilaterally, no  wheezing, no crackles, no rhonchi  Abdomen: Soft, non tender, non distended, bowel sounds present, no guarding  Data Reviewed: Basic Metabolic Panel:  Recent Labs Lab 01/13/15 1344 01/15/15 1626 01/17/15 0500 01/18/15 0430  NA 135 138 139 138  K 4.2 4.2 4.1 4.0  CL 102 101 107 105  CO2 24 25 25 27   GLUCOSE 113* 133* 109* 126*  BUN 15 15 20 18   CREATININE 0.47 0.53 0.50 0.47  CALCIUM 9.0 9.8 9.2 9.1  MG  --   --  1.9  --    Liver Function Tests:  Recent Labs Lab 01/13/15 1344 01/15/15 1626  AST 128* 119*  ALT 119* 120*  ALKPHOS 876* 940*  BILITOT 0.9 0.9  PROT 6.2* 6.8  ALBUMIN 2.6* 2.7*    Recent Labs Lab  01/13/15 1344  LIPASE 78*   CBC:  Recent Labs Lab 01/13/15 1344 01/15/15 1626 01/17/15 0500 01/18/15 0430  WBC 13.9* 23.2* 15.2* 15.2*  NEUTROABS 12.4* 21.7*  --   --   HGB 12.0 12.9 11.7* 11.6*  HCT 37.9 40.0 37.0 37.9  MCV 100.5* 99.8 101.4* 101.3*  PLT 232 262 190 205   Scheduled Meds: . acyclovir  400 mg Oral BID  . cholecalciferol  1,000 Units Oral Daily  . dexamethasone  8 mg Oral BID WC  . dronabinol  5 mg Oral BID AC  . enoxaparin  95 mg Subcutaneous Q24H  . feeding supplement (ENSURE ENLIVE)  237 mL Oral TID BM  . fluconazole  100 mg Oral Daily  . levETIRAcetam  1,000 mg Oral BID  . methylphenidate  5 mg Oral Daily  . metoCLOPramide  5 mg Oral TID AC  . mirtazapine  30 mg Oral QHS  . saccharomyces boulardii  250 mg Oral Daily  . sodium chloride  10-40 mL Intracatheter Q12H  . vancomycin  250 mg Oral 4 times per day   Continuous Infusions: . sodium chloride 75 mL/hr at 01/17/15 2223

## 2015-01-19 DIAGNOSIS — C7802 Secondary malignant neoplasm of left lung: Secondary | ICD-10-CM

## 2015-01-19 DIAGNOSIS — C773 Secondary and unspecified malignant neoplasm of axilla and upper limb lymph nodes: Secondary | ICD-10-CM

## 2015-01-19 DIAGNOSIS — C787 Secondary malignant neoplasm of liver and intrahepatic bile duct: Secondary | ICD-10-CM

## 2015-01-19 DIAGNOSIS — R627 Adult failure to thrive: Principal | ICD-10-CM

## 2015-01-19 DIAGNOSIS — R531 Weakness: Secondary | ICD-10-CM

## 2015-01-19 DIAGNOSIS — G893 Neoplasm related pain (acute) (chronic): Secondary | ICD-10-CM

## 2015-01-19 DIAGNOSIS — C7931 Secondary malignant neoplasm of brain: Secondary | ICD-10-CM

## 2015-01-19 DIAGNOSIS — E46 Unspecified protein-calorie malnutrition: Secondary | ICD-10-CM

## 2015-01-19 DIAGNOSIS — K5900 Constipation, unspecified: Secondary | ICD-10-CM

## 2015-01-19 DIAGNOSIS — C50411 Malignant neoplasm of upper-outer quadrant of right female breast: Secondary | ICD-10-CM

## 2015-01-19 DIAGNOSIS — I82409 Acute embolism and thrombosis of unspecified deep veins of unspecified lower extremity: Secondary | ICD-10-CM

## 2015-01-19 DIAGNOSIS — R110 Nausea: Secondary | ICD-10-CM

## 2015-01-19 DIAGNOSIS — R439 Unspecified disturbances of smell and taste: Secondary | ICD-10-CM

## 2015-01-19 DIAGNOSIS — R630 Anorexia: Secondary | ICD-10-CM

## 2015-01-19 DIAGNOSIS — C7801 Secondary malignant neoplasm of right lung: Secondary | ICD-10-CM

## 2015-01-19 DIAGNOSIS — B370 Candidal stomatitis: Secondary | ICD-10-CM

## 2015-01-19 LAB — CBC
HEMATOCRIT: 36.3 % (ref 36.0–46.0)
HEMOGLOBIN: 11.3 g/dL — AB (ref 12.0–15.0)
MCH: 31.7 pg (ref 26.0–34.0)
MCHC: 31.1 g/dL (ref 30.0–36.0)
MCV: 101.7 fL — AB (ref 78.0–100.0)
Platelets: 161 10*3/uL (ref 150–400)
RBC: 3.57 MIL/uL — AB (ref 3.87–5.11)
RDW: 16.8 % — ABNORMAL HIGH (ref 11.5–15.5)
WBC: 13.2 10*3/uL — ABNORMAL HIGH (ref 4.0–10.5)

## 2015-01-19 LAB — BASIC METABOLIC PANEL
Anion gap: 8 (ref 5–15)
BUN: 18 mg/dL (ref 6–20)
CHLORIDE: 107 mmol/L (ref 101–111)
CO2: 26 mmol/L (ref 22–32)
Calcium: 9.2 mg/dL (ref 8.9–10.3)
Creatinine, Ser: 0.31 mg/dL — ABNORMAL LOW (ref 0.44–1.00)
GFR calc Af Amer: >60 mL/min (ref >60)
GFR calc non Af Amer: >60 mL/min (ref >60)
Glucose, Bld: 145 mg/dL — ABNORMAL HIGH (ref 65–99)
POTASSIUM: 4 mmol/L (ref 3.5–5.1)
SODIUM: 141 mmol/L (ref 135–145)

## 2015-01-19 LAB — URINE CULTURE

## 2015-01-19 MED ORDER — DOCUSATE SODIUM 100 MG PO CAPS
100.0000 mg | ORAL_CAPSULE | Freq: Every day | ORAL | Status: DC
  Administered 2015-01-19: 100 mg via ORAL
  Filled 2015-01-19: qty 1

## 2015-01-19 MED ORDER — POLYETHYLENE GLYCOL 3350 17 G PO PACK
17.0000 g | PACK | Freq: Every day | ORAL | Status: DC
  Administered 2015-01-19: 17 g via ORAL
  Filled 2015-01-19: qty 1

## 2015-01-19 MED ORDER — DEXAMETHASONE 4 MG PO TABS
4.0000 mg | ORAL_TABLET | Freq: Every day | ORAL | Status: DC
  Administered 2015-01-20 – 2015-01-21 (×2): 4 mg via ORAL
  Filled 2015-01-19 (×3): qty 1

## 2015-01-19 MED ORDER — FLUCONAZOLE 100 MG PO TABS
200.0000 mg | ORAL_TABLET | Freq: Every day | ORAL | Status: DC
  Administered 2015-01-20 – 2015-01-21 (×2): 200 mg via ORAL
  Filled 2015-01-19 (×2): qty 2

## 2015-01-19 MED ORDER — HYDROMORPHONE HCL 4 MG PO TABS
4.0000 mg | ORAL_TABLET | Freq: Four times a day (QID) | ORAL | Status: DC
  Administered 2015-01-19 – 2015-01-21 (×8): 4 mg via ORAL
  Filled 2015-01-19 (×9): qty 1

## 2015-01-19 NOTE — Progress Notes (Signed)
Nutrition Follow-up   DOCUMENTATION CODES:   Severe malnutrition in context of chronic illness  INTERVENTION:  - Continue Ensure Enlive TID - Encourage intakes as pt feels able - RD will continue to monitor for needs  NUTRITION DIAGNOSIS:   Malnutrition related to chronic illness as evidenced by energy intake < or equal to 75% for > or equal to 1 month, percent weight loss. 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 [name] was requested for evaluation of progressive weakness, poor oral intake, FTT, confusion at home.  9/6 Pt continues to refuse meals since assessment 9/4. Marinol order continues as well as order for Reglan. Pt states she has been having abdominal pain which notes indicate may be due to liver mets. Notes in chart that pt has indicated experiencing altered taste and dx of thrush.  Several visitors in pt's room talking with her at time of RD attempted visit. Did not feel it was necessary to disrupt at this time. Will continue to monitor [place]; Palliative care following.   Not meeting needs. Medications reviewed. Labs reviewed; creatinine low.   9/4 - Pt in room sleeping. RD spoke with RN, pt has been refusing to eat.  - Per RN, pt's family has tried to get patient to eat but she won't.  - Pt has been drinking her Ensure supplements. Pt to start Marinol today.  - PO intake: 0-20%. - Per weight history, pt has lost 9 lb since 8/15 (6% weight loss x 2 weeks, significant for time frame).   Diet Order:  Diet regular Room service appropriate?: Yes; Fluid consistency:: Thin  Skin:  Wound (see comment) (arm and leg wounds)  Last BM:  9/3  Height:   Ht Readings from Last 1 Encounters:  01/15/15 5\' 4"  (1.626 m)    Weight:    Wt Readings from Last 1 Encounters:  01/15/15 142 lb 10.2 oz (64.7 kg)    Ideal Body Weight:  54.54 kg  BMI:  Body mass index is 24.47 kg/(m^2).  Estimated Nutritional Needs:   Kcal:  1950-2150  Protein:  95-105g  Fluid:  2L/day  EDUCATION NEEDS:   No education needs identified at this time     Jarome Matin, RD, LDN Inpatient Clinical Dietitian Pager # [phone] After hours/weekend pager # [phone]

## 2015-01-19 NOTE — Progress Notes (Signed)
Physical Therapy Treatment Patient Details Name: Carrie Berry MRN: [id number] DOB: 1951-04-18 Today's Date: 01/19/2015    History of Present Illness 64 y.o. female admitted with confusion, decreased p.o. intake, FTT.  Pt with hx Breast CA with mets to liver and brain.    PT Comments    Patient  Participated in  Rolling only today, c/o fatigue and pain.  Follow Up Recommendations  Home health PT;Supervision/Assistance - 24 hour;SNF (depends on progress and prognosis.)     Equipment Recommendations  Hospital bed;Other (comment)    Recommendations for Other Services       Precautions / Restrictions Precautions Precautions: Fall Precaution Comments: FRAGILE SKIN    Mobility  Bed Mobility   Bed Mobility: Rolling Rolling: Min guard         General bed mobility comments: verbal cues for technique, patient declined to sit up at the edge of the bed, rolled several times for bed change and pericare.  Transfers                 General transfer comment: not tested  Ambulation/Gait                 Stairs            Wheelchair Mobility    Modified Rankin (Stroke Patients Only)       Balance                                    Cognition Arousal/Alertness: Awake/alert Behavior During Therapy: Flat affect;WFL for tasks assessed/performed Overall Cognitive Status:  (able to state needs )                      Exercises      General Comments        Pertinent Vitals/Pain Pain Assessment: Faces Faces Pain Scale: Hurts whole lot Pain Location: abdomen, skin Pain Descriptors / Indicators: Sore Pain Intervention(s): Limited activity within patient's tolerance;Monitored during session    Home Living                      Prior Function            PT Goals (current goals can now be found in the care plan section) Progress towards PT goals: Not progressing toward goals - comment (pain limiting)    Frequency  Min 2X/week    PT Plan Frequency needs to be updated    Co-evaluation             End of Session   Activity Tolerance: Patient limited by fatigue;Patient limited by pain Patient left: in bed;with call bell/phone within reach;with bed alarm set     Time: 1005-1020 PT Time Calculation (min) (ACUTE ONLY): 15 min  Charges:  $Therapeutic Activity: 8-22 mins                    G Codes:      Claretha Cooper 01/19/2015, 10:46 AM Carrie Berry PT [phone]

## 2015-01-19 NOTE — Plan of Care (Signed)
Problem: Phase I Progression Outcomes Goal: Voiding-avoid urinary catheter unless indicated Outcome: Not Progressing Foley cath placed for end of life care

## 2015-01-19 NOTE — Progress Notes (Signed)
OT Cancellation Note  Patient Details Name: Carrie Berry MRN: [id number] DOB: 1950-12-16   Cancelled Treatment:    Reason Eval/Treat Not Completed: Pain limiting ability to participate. OT checked by. PT assisted pt to roll but pt declining more OOB at this time due to pain and fatigue. Will try back another time.  [place], [place] 01/19/2015, 11:50 AM

## 2015-01-19 NOTE — Progress Notes (Signed)
Carrie Berry   DOB:1950/11/15   EN#:[id number]   TIR#:[id number]  Subjective: Carrie Berry says she is "better" but "does not feel well." The main problem is her "stomach"-- she has abdominal pain due to liver involvement of her cancer; this also explains her lack of appetite and (partly) altered taste. The altered taste is also due to thrush, itself due to steroids-- the decadron dose was increased to see if that helped the patient's FTT but it has had no effect. She has a h/a this AM, some nausea, blurred vision, no cough or SOB, does not recall BM in 24-48 hours. Husband not in room   Objective: middle aged White woman exmined in bed Filed Vitals:   01/19/15 0538  BP: 153/66  Pulse: 63  Temp: 97.9 F (36.6 C)  Resp: 20    Body mass index is 24.47 kg/(m^2).  Intake/Output Summary (Last 24 hours) at 01/19/15 0752 Last data filed at 01/19/15 0701  Gross per 24 hour  Intake   1320 ml  Output    700 ml  Net    620 ml     EOMs intact  Oropharynx shows thrush  No peripheral adenopathy  Lungs clear -- auscultated anterolaterally  Heart regular rate and rhythm  Abdomen firm, distended, +BS  Breast exam: deferred  CBG (last 3)  No results for input(s): GLUCAP in the last 72 hours.   Labs:  Lab Results  Component Value Date   WBC 13.2* 01/19/2015   HGB 11.3* 01/19/2015   HCT 36.3 01/19/2015   MCV 101.7* 01/19/2015   PLT 161 01/19/2015   NEUTROABS 21.7* 01/15/2015    @LASTCHEMISTRY @  Urine Studies No results for input(s): UHGB, CRYS in the last 72 hours.  Invalid input(s): UACOL, UAPR, USPG, UPH, UTP, UGL, UKET, UBIL, UNIT, UROB, [place], UEPI, UWBC, Duwayne Heck [place], [place]  Basic Metabolic Panel:  Recent Labs Lab 01/13/15 1344 01/15/15 1626 01/17/15 0500 01/18/15 0430 01/19/15 0448  NA 135 138 139 138 141  K 4.2 4.2 4.1 4.0 4.0  CL 102 101 107 105 107  CO2 24 25 25 27 26   GLUCOSE 113* 133* 109* 126* 145*  BUN 15 15 20 18 18   CREATININE 0.47 0.53 0.50 0.47 0.31*   CALCIUM 9.0 9.8 9.2 9.1 9.2  MG  --   --  1.9  --   --    GFR Estimated Creatinine Clearance: 63.8 mL/min (by C-G formula based on Cr of 0.31). Liver Function Tests:  Recent Labs Lab 01/13/15 1344 01/15/15 1626  AST 128* 119*  ALT 119* 120*  ALKPHOS 876* 940*  BILITOT 0.9 0.9  PROT 6.2* 6.8  ALBUMIN 2.6* 2.7*    Recent Labs Lab 01/13/15 1344  LIPASE 78*   No results for input(s): AMMONIA in the last 168 hours. Coagulation profile No results for input(s): INR, PROTIME in the last 168 hours.  CBC:  Recent Labs Lab 01/13/15 1344 01/15/15 1626 01/17/15 0500 01/18/15 0430 01/19/15 0448  WBC 13.9* 23.2* 15.2* 15.2* 13.2*  NEUTROABS 12.4* 21.7*  --   --   --   HGB 12.0 12.9 11.7* 11.6* 11.3*  HCT 37.9 40.0 37.0 37.9 36.3  MCV 100.5* 99.8 101.4* 101.3* 101.7*  PLT 232 262 190 205 161   Cardiac Enzymes: No results for input(s): CKTOTAL, CKMB, CKMBINDEX, TROPONINI in the last 168 hours. BNP: Invalid input(s): POCBNP CBG: No results for input(s): GLUCAP in the last 168 hours. D-Dimer No results for input(s): DDIMER in the last 72  hours. Hgb A1c No results for input(s): HGBA1C in the last 72 hours. Lipid Profile No results for input(s): CHOL, HDL, LDLCALC, TRIG, CHOLHDL, LDLDIRECT in the last 72 hours. Thyroid function studies No results for input(s): TSH, T4TOTAL, T3FREE, THYROIDAB in the last 72 hours.  Invalid input(s): FREET3 Anemia work up No results for input(s): VITAMINB12, FOLATE, FERRITIN, TIBC, IRON, RETICCTPCT in the last 72 hours. Microbiology Recent Results (from the past 240 hour(s))  Urine culture     Status: None   Collection Time: 01/16/15  5:57 AM  Result Value Ref Range Status   Specimen Description URINE, RANDOM  Final   Special Requests NONE  Final   Culture   Final    10,000 COLONIES/mL CITROBACTER FREUNDII Performed at [facility]    Report Status 01/18/2015 FINAL  Final   Organism ID, Bacteria CITROBACTER FREUNDII  Final       Susceptibility   Citrobacter freundii - MIC*    CEFAZOLIN >=64 RESISTANT Resistant     CEFTRIAXONE <=1 SENSITIVE Sensitive     CIPROFLOXACIN <=0.25 SENSITIVE Sensitive     GENTAMICIN <=1 SENSITIVE Sensitive     IMIPENEM <=0.25 SENSITIVE Sensitive     NITROFURANTOIN <=16 SENSITIVE Sensitive     TRIMETH/SULFA <=20 SENSITIVE Sensitive     PIP/TAZO <=4 SENSITIVE Sensitive     * 10,000 COLONIES/mL CITROBACTER FREUNDII  Urine culture     Status: None (Preliminary result)   Collection Time: 01/17/15  5:17 AM  Result Value Ref Range Status   Specimen Description URINE, RANDOM  Final   Special Requests NONE  Final   Culture   Final    CULTURE REINCUBATED FOR BETTER GROWTH Performed at [facility]    Report Status PENDING  Incomplete      Studies:  No results found.  Assessment: 64 y.o. Carrie Berry, [place] woman status post right breast upper outer quadrant and right axillary lymph node biopsy 12/23/2012 for a clinical T3 N1 M1, stage IV invasive ductal carcinoma, grade 3, triple negative, with an MIB-1 of 86%.  (1) right liver lobe biopsy 01/21/2013 confirms metastatic adenocarcinoma; scans show involvement of the liver, lungs, and likely bone.  (2) enrolled in [facility] study [id number] (docetaxel + oral gamma secretase inibitor [id number]), received one cycle starting 02/04/2013 but withdrew because of poor tolerance despite treatment interruptions and decreased dosing of the oral component  (3) chest CT in early November 2014 was compared with studies in August 2014 and showed interval progression of metastatic breast cancer, with an enlarging primary right breast mass, enlarging right axillary lymph nodes, enlarging pulmonary nodules and new/enlarging hepatic metastases.  (4) Abraxane started 03/19/2013, given day 1 and day 8 of each 21 day cycle, stopped with the day 1 cycle 3 dose (04/29/2013) because of local progression of disease  (5) cyclophosphamide and doxorubicin started  05/20/2013, given in dose dense fashion with Neulasta support on day 2. Completed 4 cycles 07/03/2013, with the final cycle dose reduced 15% because of an episode of febrile neutropenia after cycle 3. Restaging studies 07/15/2013 showed partial response.  (6) started capecitabine April 2015, 1.5 g by mouth twice a day, 7 days on 7 days off--discontinued October 2015 with progression  (7) s/p Right simple mastectomy 02/17/2014 to optimize local control, the pathology ([id number]) showing an invasive ductal carcinoma measuring 4.8 cm, grade 3, with close but negative margins and evidence of treatment response  (a) nodule on right mastectomy scar noted 04/03/2014, removed 04/14/2014 (b) new nodule noted  on R chest scar 05/19/2014 (c) radiation to R chest wall completed 07/22/2014  (8) multiple brain metastases noted on brain MRI 02/26/2014--s/p whole brain irradiation completed 03/17/2014 (a) repeat brain MRI 05/12/2014 shows favorable response (b) repeat brain MRI 07/15/2014 shows questionable growth/ stable disease (c) repeat brain MRI 01/12/2015 shows 10 intracranial metastatic lesions without significant progression compared to June 2016  (9) protein-calorie malnutrition  (10) high fall risk  (11) advanced directives: not in place; husband is healthcare power of attorney (a) patient agreed to a limited code (no CPR, no intubation) during her more recent admission  (12) unexplained paroxysmal knee pain: possible ruptured Baker's cyst, Left, 05/01/2014  (13) doxil started 05/28/2014, repeated every 28 days. Cycle 3 delayed 2 weeks because of decrease in ejection fraction, which proved spurious (MUGA results 63% EF). Doxil resumed 04//11/2014 with evidence of response. Doxil discontinued after 11/25/2014 dose, with evidence of progression (a) MRI of the liver 11/02/2014 shows stable  disease (b) CT scan of the abdomen and pelvis 12/27/2014 shows progressive disease in the liver and lungs  (14) c difficile colitis 11/12/2014, s/p metronidazole with recurrence, continuing oral vancomycin  (15) nonocclusive deep vein thromboses noted 12/27/2014 on CT scan of the abdomen and pelvis (a) on Lovenox subcutaneously  (16) Terminal care: The patient is unlikely to derive any benefit from further chemotherapy, but is very likely to experience only toxicity and side effects which could shorten instead of lengthening her life. Accordingly at this point no further life prolonging treatments are planned. A comfort care only approach would be appropriate, if the patient and her husband were agreeable.  (17) comfort care: issues include  (a) thrush: use diflucan until clear, drop steroid dose as steroids not effective  (b) pain: titrate pain med to baseline levels 4 or less  (c) constipation: secondary to narcotics; intensify bowel prophylaxis; repeat C difficile  (d) weakness: patient is agreeable to a foley  (18) d/c plans: greatly appreciate IN and Palliative Care's help to this family, esp. Patient's husband, in clarifying goals of care and hopefully providing them the support they will need so patient can return home  Plan: working on #17 above; hope to meet with husband sometime today (he can come by our office) and to help clarify goals of care and whether Gloriann an return home if sufficient help is available.  Greatly appreciate your help to this patient and her family!   Chauncey Cruel, MD 01/19/2015  7:52 AM Medical Oncology and Hematology [facility] [address] [place], [place] [address] Tel. [phone]    Fax. [phone]

## 2015-01-19 NOTE — Plan of Care (Signed)
Problem: Phase I Progression Outcomes Goal: Voiding-avoid urinary catheter unless indicated Outcome: Not Progressing EOL Care  Problem: Phase II Progression Outcomes Goal: Obtain order to discontinue catheter if appropriate Outcome: Not Progressing EOL CARe  Problem: Phase III Progression Outcomes Goal: Foley discontinued Outcome: Not Progressing EOL Care

## 2015-01-19 NOTE — Progress Notes (Signed)
Daily Progress Note   Patient Name: Carrie Berry       Date: 01/19/2015 DOB: December 13, 1950  Age: 64 y.o. MRN#: [id number] Attending Physician: Theodis Blaze, MD Primary Care Physician: [name], MD Admit Date: 01/15/2015  Reason for Consultation/Follow-up: Establishing goals of care  Subjective: 64 y/o woman with metastatic breast cancer to brain, liver and lung admitted at request of Dr. [name]. She has been getting weaker at home, has not been eating or drinking much, is having difficulty getting out of bed and has been very confused. Dr. [name] recommended increasing decadron from 2 to 8 mg, but this did not immediately improve Carrie symptoms and she was admitted to the hospital. Dr. [name] has discussed hospice on multiple occasions, but Carrie Berry has been very reluctant.    Interval Events: I met with Carrie Berry and Carrie Berry this evening. Carrie Berry reports that he feels she is doing a little bit better than whenever she was admitted. He is still frustrated with the fact that she does not have an appetite and has not been eating much. He encourages Carrie several times throughout the encounter to eat more of Carrie dinner despite Carrie saying she is hungry.  We again discussed Carrie overall clinical course moving forward. The patient and Carrie Berry still have a lot of angst regarding how to best care for Carrie at this stage in Carrie disease. Are in agreement that care plan moving forward should focus on helping Carrie Berry retain as much functional status as possible through aggressive symptom management.  Today, the Berry reports that he needs to keep working and is afraid he will not be able to care for his wife as he needs to at home. He mentions the possibility of hospice support and notes [place] during this. I tried to follow-up on these comments but he was not willing to discuss further at this time. We will need to continue to progress slowly through discussion of Carrie disease  course and plan of care moving forward as is very difficult for this family. I'm encouraged by the fact that now Carrie Berry is able to consider hospice as a viable option as I believe this is how she would best be served with Carrie care..  - Patient reports she symptomatically feels better today - Patient and Carrie Berry are still struggling with Carrie chronic illness and coming up with plan at discharge that best serves Carrie and Carrie family. Will continue to follow-up with family throughout admission.   Length of Stay: 4 days  Current Medications: Scheduled Meds:  . acyclovir  400 mg Oral BID  . cholecalciferol  1,000 Units Oral Daily  . dexamethasone  4 mg Oral Daily  . dronabinol  5 mg Oral BID AC  . enoxaparin  95 mg Subcutaneous Q24H  . feeding supplement (ENSURE ENLIVE)  237 mL Oral TID BM  . [START ON 01/20/2015] fluconazole  200 mg Oral Daily  . HYDROmorphone  4 mg Oral QID  . levETIRAcetam  1,000 mg Oral BID  . methylphenidate  5 mg Oral Daily  . metoCLOPramide  5 mg Oral TID AC  . mirtazapine  30 mg Oral QHS  . saccharomyces boulardii  250 mg Oral Daily  . sodium chloride  10-40 mL Intracatheter Q12H  . vancomycin  250 mg Oral 4 times per day    Continuous Infusions: . sodium chloride 75 mL/hr at 01/19/15 0851    PRN Meds: acetaminophen **OR** [DISCONTINUED] acetaminophen,  HYDROmorphone, ondansetron **OR** ondansetron (ZOFRAN) IV, sodium chloride  Palliative Performance Scale: 30-40%     Vital Signs: BP 140/78 mmHg  Pulse 74  Temp(Src) 98.1 F (36.7 C) (Oral)  Resp 18  Ht $R'5\' 4"'ST$  (1.626 m)  Wt 64.7 kg (142 lb 10.2 oz)  BMI 24.47 kg/m2  SpO2 100% SpO2: SpO2: 100 % O2 Device: O2 Device: Not Delivered O2 Flow Rate:    Intake/output summary:   Intake/Output Summary (Last 24 hours) at 01/19/15 1904 Last data filed at 01/19/15 1834  Gross per 24 hour  Intake    465 ml  Output   2300 ml  Net  -1835 ml   LBM:   Baseline Weight: Weight: 64.7 kg (142 lb 10.2  oz) Most recent weight: Weight: 64.7 kg (142 lb 10.2 oz)  Physical Exam: General: Cachectic, sleepy but awakens easily, in no acute distress.  HEENT: No bruits, no goiter, no JVD Heart: Regular rate and rhythm. No murmur appreciated. Lungs: air air movement, clear Abdomen: Soft, nontender, nondistended, positive bowel sounds.  Ext: No significant edema Skin: Warm and dry Neuro: Grossly intact, nonfocal.            Additional Data Reviewed: Recent Labs     01/18/15  0430  01/19/15  0448  WBC  15.2*  13.2*  HGB  11.6*  11.3*  PLT  205  161  NA  138  141  BUN  18  18  CREATININE  0.47  0.31*     Problem List:  Patient Active Problem List   Diagnosis Date Noted  . Palliative care encounter   . Goals of care, counseling/discussion   . Sepsis 12/27/2014  . Abdominal pain 12/27/2014  . Lumbar pain 12/23/2014  . Hyperphosphatemia 12/23/2014  . Transaminitis 12/23/2014  . Hypoalbuminemia 12/23/2014  . Long term current use of anticoagulant therapy 12/23/2014  . Clostridium difficile colitis 11/25/2014  . C. difficile colitis 11/13/2014  . Generalized weakness 11/10/2014  . Difficulty swallowing 10/15/2014  . DVT (deep venous thrombosis) 08/27/2014  . Back spasm 08/06/2014  . Skin breakdown 07/30/2014  . Elevated liver enzymes 07/30/2014  . Thrush 05/28/2014  . Protein-calorie malnutrition, severe 03/20/2014  . Breast cancer metastasized to brain 03/19/2014  . FTT (failure to thrive) in adult 03/19/2014  . Weakness 03/19/2014  . Lung metastases 03/12/2014  . Liver metastases 03/12/2014  . Constipation 03/12/2014  . Physical deconditioning 03/07/2014  . Diarrhea 03/07/2014  . Primary cancer of right breast with metastasis to other site 01/26/2014  . Rash, skin 10/13/2013  . Insomnia 10/13/2013  . Epiphora 04/29/2013  . GERD (gastroesophageal reflux disease) 03/19/2013  . Breast cancer of right female breast s/p palliative mastectomy 02/17/14 12/25/2012      Palliative Care Assessment & Plan    Code Status:  Limited code  Goals of Care:  Patient is not a candidate for further disease modifying therapy. Hospice has been recommended several times to family but Berry has been resistant to considering this in the past. Will continue to work with family on best plan for discharge in order to focus on patient's goals of living as long as possible as well as possible, spending time with family, and being outside hospital.  Symptom Management:  Pain: She reports that has relief of Carrie pain from Dilaudid but lasts only around 3 hours. Increased frequency of medication to Dilaudid 6 mg by mouth every 3 hours when necessary. She is also now scheduled Dilaudid 4 mg 4 times per  day. I discussed with Carrie that they will bring Carrie pain medications regularly but she will also need to ask for Carrie additional pain medication in order to ensure adequate pain control. She expressed understanding of this. Poor appetite: Continue Reglan 5 mg 3 times per day 30 minutes prior to meals.She is already on Remeron. Agree with plan for trial dronabinol.  Psycho-social/Spiritual:  Desire for further Chaplaincy support:yes   Prognosis: < 6 months Discharge Planning: To be determined. Family seems more open to considering home with hospice at this time.   Care plan was discussed with patient and Carrie Berry  Thank you for allowing the Palliative Medicine Team to assist in the care of this patient.   Time In: 1700 Time Out: 1730 Total Time 30 Prolonged Time Billed  no     Greater than 50%  of this time was spent counseling and coordinating care related to the above assessment and plan.   Micheline Rough, MD  01/19/2015, 7:04 PM  Please contact Palliative Medicine Team phone at [phone] for questions and concerns.

## 2015-01-19 NOTE — Progress Notes (Signed)
Patient ID: Carrie Berry, female   DOB: 1950/09/19, 64 y.o.   MRN: [id number]  [facility] PROGRESS NOTE  Carrie Berry KVQ:[id number] DOB: [birth year] DOA: 01/15/2015 PCP: [name], MD   Brief narrative:    64 y/o woman with metastatic breast cancer to brain, liver and lung for whom direct admission by Dr. [name] was requested for evaluation of progressive weakness, poor oral intake, FTT, confusion at home.  Assessment/Plan:    Acute encephalopathy - metabolic and secondary to FTT, dehydration - slowly improving, eating better but still not adequate  - Marinol added and seems to be doing better with this, we have also added Reglan - PCT consulted, husband is still onboard with continuing current care - I have discussed with him Dr. 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  - has been on oral vancomycin - pt had several episodes of diarrhea this AM - will stop colace and miralax  - WBC is trending down, will repeat CBC in AM  Failure to thrive with severe PCM - in the setting of chronic illness, metastatic breast cancer  - nutritionist consulted - added Marinol and Reglan - pt eating bit better in the past 24 hours   Chronic systolic heart failure EF 40-45% - Appears to be compensated monitor closely  Transaminitis - likely from hepatic metastases  - no need for trending   Anemia of chronic disease, malignancy, macrocytic  - no signs of active bleeding   Functional quadriplegia - bed bound for now due to progressive nature of malignancy - attempt PT/OT if pt able to tolerate   DVT prophylaxis  - Lovenox   Code Status: Partial code, no CPR or intubation but OK to place on pressors  Family Communication:  plan of care discussed with the husband at bedside  Disposition Plan: Home in 1-2 days, depending on level of alertness, husband would like to see his wife more awake, eating better but I have discussed with him this is difficult situation, she may not get much better. She has not gotten out of bed and will continue our efforts. When ready, will go home with husband.   IV access:  Peripheral IV  Procedures and diagnostic studies:     Dg Abd Acute W/chest 01/16/2015  No acute abnormality.  Unremarkable bowel gas pattern.  Pulmonary nodules/metastases again noted.    Medical Consultants:  PCT  Other Consultants:  PT OT Nutritionist   IAnti-Infectives:   Oral vancomycin  Diflucan   Faye Ramsay, MD  [facility] Pager [phone]  If 7PM-7AM, please contact night-coverage [email or web address] Password [email or web address] 01/19/2015, 4:03 PM   LOS: 4 days   HPI/Subjective: No events overnight. 4 episodes of diarrhea this AM  Objective: Filed Vitals:   01/18/15 1408 01/18/15 2103 01/19/15 0538 01/19/15 1445  BP: 117/82 121/84 153/66 140/78  Pulse: 70 66 63 74  Temp: 97.7 F (36.5 C) 97.8 F (36.6 C) 97.9 F (36.6 C) 98.1 F (36.7 C)  TempSrc: Oral Oral Axillary Oral  Resp: 20 20 20 18   Height:      Weight:      SpO2: 99% 97%  100% 100%    Intake/Output Summary (Last 24 hours) at 01/19/15 1603 Last data filed at 01/19/15 1446  Gross per 24 hour  Intake    630 ml  Output   1700 ml  Net  -1070 ml    Exam:   General:  Pt is somnolent, easy to awake, NAD, frail and weak   Cardiovascular: Regular rate and rhythm, no rubs, no gallops  Respiratory: Clear to auscultation bilaterally, no wheezing, no crackles, no rhonchi  Abdomen: Soft, non tender, non distended, bowel sounds present, no guarding  Data Reviewed: Basic Metabolic Panel:  Recent Labs Lab 01/13/15 1344  01/15/15 1626 01/17/15 0500 01/18/15 0430 01/19/15 0448  NA 135 138 139 138 141  K 4.2 4.2 4.1 4.0 4.0  CL 102 101 107 105 107  CO2 24 25 25 27 26   GLUCOSE 113* 133* 109* 126* 145*  BUN 15 15 20 18 18   CREATININE 0.47 0.53 0.50 0.47 0.31*  CALCIUM 9.0 9.8 9.2 9.1 9.2  MG  --   --  1.9  --   --    Liver Function Tests:  Recent Labs Lab 01/13/15 1344 01/15/15 1626  AST 128* 119*  ALT 119* 120*  ALKPHOS 876* 940*  BILITOT 0.9 0.9  PROT 6.2* 6.8  ALBUMIN 2.6* 2.7*    Recent Labs Lab 01/13/15 1344  LIPASE 78*   CBC:  Recent Labs Lab 01/13/15 1344 01/15/15 1626 01/17/15 0500 01/18/15 0430 01/19/15 0448  WBC 13.9* 23.2* 15.2* 15.2* 13.2*  NEUTROABS 12.4* 21.7*  --   --   --   HGB 12.0 12.9 11.7* 11.6* 11.3*  HCT 37.9 40.0 37.0 37.9 36.3  MCV 100.5* 99.8 101.4* 101.3* 101.7*  PLT 232 262 190 205 161   Scheduled Meds: . acyclovir  400 mg Oral BID  . cholecalciferol  1,000 Units Oral Daily  . dexamethasone  4 mg Oral Daily  . dronabinol  5 mg Oral BID AC  . enoxaparin  95 mg Subcutaneous Q24H  . feeding supplement (ENSURE ENLIVE)  237 mL Oral TID BM  . [START ON 01/20/2015] fluconazole  200 mg Oral Daily  . HYDROmorphone  4 mg Oral QID  . levETIRAcetam  1,000 mg Oral BID  . methylphenidate  5 mg Oral Daily  . metoCLOPramide  5 mg Oral TID AC  . mirtazapine  30 mg Oral QHS  . saccharomyces boulardii  250 mg Oral Daily  . sodium chloride  10-40 mL Intracatheter Q12H  . vancomycin  250 mg Oral 4 times per day   Continuous Infusions: . sodium chloride 75 mL/hr at 01/19/15 [phone]

## 2015-01-20 ENCOUNTER — Ambulatory Visit: Payer: BC Managed Care – PPO | Admitting: Oncology

## 2015-01-20 ENCOUNTER — Other Ambulatory Visit: Payer: BC Managed Care – PPO

## 2015-01-20 ENCOUNTER — Ambulatory Visit: Payer: BC Managed Care – PPO

## 2015-01-20 ENCOUNTER — Other Ambulatory Visit: Payer: Self-pay | Admitting: *Deleted

## 2015-01-20 DIAGNOSIS — Z515 Encounter for palliative care: Secondary | ICD-10-CM

## 2015-01-20 DIAGNOSIS — C50919 Malignant neoplasm of unspecified site of unspecified female breast: Secondary | ICD-10-CM

## 2015-01-20 MED ORDER — DRONABINOL 2.5 MG PO CAPS
5.0000 mg | ORAL_CAPSULE | Freq: Three times a day (TID) | ORAL | Status: DC
  Administered 2015-01-21 (×3): 5 mg via ORAL
  Filled 2015-01-20 (×3): qty 2

## 2015-01-20 NOTE — Progress Notes (Signed)
Met with the patient's husband outside the room. The patient's husband [name] states he is still convinced that if the patient were to be fed or even force fed or if she were kept hydrated, this would be beneficial to the patient.   Discussed for a long time about cancer related cachexia and eating/drinking patterns changing at end of life.  I have reiterated to Mr Deland that inpatient hospice appears to be the most appropriate discharge option. Mr Maheu is upset about Hospice not giving IVF. Discussed non benefit of artificial nutrition and hydration at this stage of her illness. Discussed comfort feeds and moistening her oral cavity and providing oral care instead.   I was joined by the patient's bedside RN in talking with Mr Appleby. He is at least willing to speak with [facility] liaison.  We will continue to follow Carrie Chance MD [facility] health palliative care [phone]

## 2015-01-20 NOTE — Evaluation (Signed)
Occupational Therapy Evaluation Patient Details Name: Carrie Berry MRN: [id number] DOB: 02/05/1951 Today's Date: 01/20/2015    History of Present Illness 64 y.o. female admitted with confusion, decreased p.o. intake, FTT.  Pt with hx Breast CA with mets to liver and brain.   Clinical Impression   Pt admitted with confusion. Pt currently with functional limitations due to the deficits listed below (see OT Problem List).  Pt will benefit from skilled OT to increase their safety and independence with ADL and functional mobility for ADL to facilitate discharge to venue listed below.      Follow Up Recommendations  Other (comment);Supervision/Assistance - 24 hour (hospice)    Equipment Recommendations  3 in 1 bedside comode    Recommendations for Other Services       Precautions / Restrictions Precautions Precautions: Fall Precaution Comments: FRAGILE SKIN      Mobility Bed Mobility Overal bed mobility: Needs Assistance Bed Mobility: Supine to Sit     Supine to sit: Mod assist        Transfers Overall transfer level: Needs assistance Equipment used: 2 person hand held assist Transfers: Sit to/from Stand;Stand Pivot Transfers Sit to Stand: +2 physical assistance;+2 safety/equipment;Max assist Stand pivot transfers: +2 physical assistance;+2 safety/equipment;Max assist                 ADL Overall ADL's : Needs assistance/impaired     Grooming: Minimal assistance;Sitting;Wash/dry face                                 General ADL Comments: Pt agreed to sitting EOB for bath then getting to chair.  Pt total A for bath but did mainting sitting EOB     Vision            Pertinent Vitals/Pain Pain Score: 6  Faces Pain Scale: Hurts even more Pain Location: skin Pain Descriptors / Indicators: Sore Pain Intervention(s): Monitored during session;Limited activity within patient's tolerance     Hand Dominance Right   Extremity/Trunk Assessment  Upper Extremity Assessment Upper Extremity Assessment: Generalized weakness           Communication Communication Communication: No difficulties   Cognition Arousal/Alertness: Awake/alert Behavior During Therapy: Flat affect;WFL for tasks assessed/performed                       General Comments   pt got up to chair or first time with OT and [place] expects to be discharged to:: Private residence Living Arrangements: Spouse/significant other Available Help at Discharge: Family;Available 24 hours/day Type of Home: House Home Access: Stairs to enter [employer] of Steps: 5 Entrance Stairs-Rails: Right;Left Home Layout: One level     Bathroom Shower/Tub: Occupational psychologist: Standard     Home Equipment: Environmental consultant - 4 wheels;Cane - single point;Wheelchair - [employer];Shower seat          Prior Functioning/Environment Level of Independence: Needs assistance  Gait / Transfers Assistance Needed: uses a RW, ADL's / Homemaking Assistance Needed: husband assisting with sponge bathing, dressing, meals, housework        OT Diagnosis: Generalized weakness;Acute pain   OT Problem List: Decreased strength;Decreased activity tolerance;Pain   OT Treatment/Interventions: Self-care/ADL training;DME and/or AE instruction;Patient/family education    OT Goals(Current goals can be found in the care plan section) Acute  Rehab OT Goals Patient Stated Goal: Home with husband, with hospice OT Goal Formulation: With patient Time For Goal Achievement: 02/03/15 Potential to Achieve Goals: Good  OT Frequency: Min 2X/week   Barriers to D/C:            Co-evaluation              End of Session Nurse Communication: Mobility status  Activity Tolerance: Patient tolerated treatment well Patient left: in chair   Time: 1225-1241 OT Time Calculation (min): 16 min Charges:  OT General Charges $OT  Visit: 1 Procedure OT Evaluation $Initial OT Evaluation Tier I: 1 Procedure G-Codes:    Carrie Berry February 17, 2015, 1:39 PM

## 2015-01-20 NOTE — Telephone Encounter (Signed)
Admitted since 01-15-2015.

## 2015-01-20 NOTE — Progress Notes (Signed)
   01/20/15 1600  Clinical Encounter Type  Visited With Patient and family together  Visit Type Initial;Spiritual support  Referral From Nurse  Consult/Referral To None  Spiritual Encounters  Spiritual Needs Emotional;Grief support  Stress Factors  Patient Stress Factors Exhausted  Family Stress Factors Exhausted;Loss;Loss of control    Counseling intern met with pt and pt family (mother, father, husband). Referral received from social worker reporting that the pt's husband needed emotional support surrounding the decision to move the pt to hospice care. Pt's husband expressed frustration surrounding the transition from maintaining her illness to end-of-life care and the decision to move to hospice. The husband reported feelings of guilt regarding not having the ability to care for her in the home as well as "unfulfilled dreams" that the couple had together, such as going on a cruise and building a garage. Additionally, the pt's husband discussed the death of his son approximately 5 years ago and the feelings of anger that resulted from his son's death and his wife's current situation. The pt's husband indicated that he feels very angry at God and expressed anger/frustration that things are unpredictable and outside of his control. Pt's husband acknowledged the need for hospice care and expressed some resignation to "the inevitable," although he "still [doesn't] like it." Pt's mother and father were also present and reported that they felt they realistically understood that the pt was "suffering" and that as a mother she couldn't bear to see her daughter suffer. The pt's mother also indicated that the pt told her yesterday 9/6 that she was "tired of fighting." Discussed the role of social, emotional, and spiritual support, physical and mental health, and coping skills with the pt's family including what they do to cope and process the pt's illness and end-of-life care. Counseling intern told family she  would follow up with them on Friday and provided a pamphlet with her contact information.   [place]

## 2015-01-20 NOTE — Progress Notes (Signed)
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 [name] was requested for evaluation of progressive weakness, poor oral intake, FTT, confusion at home.     Assessment/Plan:  Acute encephalopathy - metabolic and secondary to FTT, dehydration - Patient's mental status fluctuates related to pain medications. As per nursing, when alert she is quite coherent and then has periods of lethargy. - She is at risk of recurrent mental status changes related to brain metastases, pain medications.  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 He recommends either home with hospice (spouse states that he may not be able to handle), residential hospice versus SNF with hospice. As per oncology, spouse has been resistant/reluctant in the past 2 hospice. - Discussed extensively with patient's spouse and patient at bedside and then again with spouse in private and explained in detail regarding end-stage cancer diagnosis and likelihood of progressive decline and focus to be one of comfort. He eventually seems to be coming along and expressing openness to residential hospice. Case management and social worker consulted. Discussed with palliative care M.D. as well.  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 Spouse asking to increase dose if possible.  Chronic systolic heart failure EF 40-45% - Appears to be compensated monitor closely  Transaminitis - likely from hepatic metastases  - no need for trending   Anemia of chronic disease, malignancy, macrocytic  - no signs of active bleeding. Stable  Functional quadriplegia - bed bound for now due to progressive nature of malignancy - attempt PT/OT if pt able to tolerate   DVT prophylaxis -on full dose Lovenox PTA-continue Code Status: Partial code, no CPR or intubation but OK to place on pressors  Family Communication: plan of care discussed with the husband at bedside and then with him in private. Patient's pastor was also at bedside. Disposition Plan: Spouse seems to be agreeable for DC to [facility]- when bed available   Consultants:  Medical Oncology  Palliative care medicine  Procedures:  None  Antibiotics:  None   Subjective: Patient states that she feels "okay". Appetite better but still poor. As per spouse, intermittent drowsiness and hallucinations. As per nursing, and alert she is quite coherent. Periods of lethargy.  Objective: Filed Vitals:   01/19/15 2116 01/20/15 0439 01/20/15 0826 01/20/15 1303  BP: 124/69 131/76 147/70 119/72  Pulse: 79 84 76 100  Temp: 97.8 F (36.6 C) 97.1 F (36.2 C) 97.2 F (36.2 C) 97.9 F (36.6 C)  TempSrc: Oral Axillary Oral Oral  Resp: 16 16 16 18   Height:      Weight:   65.8 kg (145 lb 1 oz)   SpO2: 93% 95% 96%  96%    Intake/Output Summary (Last 24 hours) at 01/20/15 1531 Last data filed at 01/20/15 1309  Gross per 24 hour  Intake   2845 ml  Output   1575 ml  Net   1270 ml   Filed Weights   01/15/15 1451 01/20/15 0826  Weight: 64.7 kg (142 lb 10.2 oz) 65.8 kg (145 lb 1 oz)     Exam:  General  exam: Small built and cachectic chronically ill-looking middle-aged female lying comfortably propped up in bed. Respiratory system: Reduced breath sounds bilaterally. No increased work of breathing. Cardiovascular system: S1 & S2 heard, RRR. No JVD, murmurs, gallops, clicks or pedal edema. Gastrointestinal system: Abdomen is nondistended, soft and nontender. Normal bowel sounds heard. Central nervous system: Somnolent but easily arousable and oriented to self and place. No focal neurological deficits. Extremities: Symmetric 5 x 5 power.   Data Reviewed: Basic Metabolic Panel:  Recent Labs Lab 01/15/15 1626 01/17/15 0500 01/18/15 0430 01/19/15 0448  NA 138 139 138 141  K 4.2 4.1 4.0 4.0  CL 101 107 105 107  CO2 25 25 27 26   GLUCOSE 133* 109* 126* 145*  BUN 15 20 18 18   CREATININE 0.53 0.50 0.47 0.31*  CALCIUM 9.8 9.2 9.1 9.2  MG  --  1.9  --   --    Liver Function Tests:  Recent Labs Lab 01/15/15 1626  AST 119*  ALT 120*  ALKPHOS 940*  BILITOT 0.9  PROT 6.8  ALBUMIN 2.7*   No results for input(s): LIPASE, AMYLASE in the last 168 hours. No results for input(s): AMMONIA in the last 168 hours. CBC:  Recent Labs Lab 01/15/15 1626 01/17/15 0500 01/18/15 0430 01/19/15 0448  WBC 23.2* 15.2* 15.2* 13.2*  NEUTROABS 21.7*  --   --   --   HGB 12.9 11.7* 11.6* 11.3*  HCT 40.0 37.0 37.9 36.3  MCV 99.8 101.4* 101.3* 101.7*  PLT 262 190 205 161   Cardiac Enzymes: No results for input(s): CKTOTAL, CKMB, CKMBINDEX, TROPONINI in the last 168 hours. BNP (last 3 results) No results for input(s): PROBNP in the last 8760 hours. CBG: No results for input(s): GLUCAP in the last 168 hours.  Recent Results (from the past 240 hour(s))  Urine culture     Status: None   Collection Time: 01/16/15  5:57 AM  Result Value Ref Range Status   Specimen Description URINE, RANDOM  Final   Special Requests NONE  Final   Culture   Final    10,000 COLONIES/mL CITROBACTER  FREUNDII Performed at [facility]    Report Status 01/18/2015 FINAL  Final   Organism ID, Bacteria CITROBACTER FREUNDII  Final      Susceptibility   Citrobacter freundii - MIC*    CEFAZOLIN >=64 RESISTANT Resistant     CEFTRIAXONE <=1 SENSITIVE Sensitive     CIPROFLOXACIN <=0.25 SENSITIVE Sensitive     GENTAMICIN <=1 SENSITIVE Sensitive     IMIPENEM <=0.25 SENSITIVE Sensitive     NITROFURANTOIN <=16 SENSITIVE Sensitive     TRIMETH/SULFA <=20 SENSITIVE Sensitive     PIP/TAZO <=4 SENSITIVE Sensitive     * 10,000 COLONIES/mL CITROBACTER FREUNDII  Urine culture     Status: None   Collection Time: 01/17/15  5:17 AM  Result Value Ref Range Status   Specimen Description URINE, RANDOM  Final   Special Requests NONE  Final   Culture   Final    MULTIPLE SPECIES PRESENT, SUGGEST RECOLLECTION Performed at [facility]    Report Status 01/19/2015 FINAL  Final           Studies: No results found.      Scheduled Meds: . acyclovir  400 mg Oral BID  . cholecalciferol  1,000 Units Oral Daily  . dexamethasone  4 mg Oral Daily  . dronabinol  5 mg Oral BID AC  . enoxaparin  95 mg Subcutaneous Q24H  . feeding supplement (ENSURE ENLIVE)  237 mL Oral TID BM  . fluconazole  200 mg Oral Daily  . HYDROmorphone  4 mg Oral QID  . levETIRAcetam  1,000 mg Oral BID  . methylphenidate  5 mg Oral Daily  . metoCLOPramide  5 mg Oral TID AC  . mirtazapine  30 mg Oral QHS  . saccharomyces boulardii  250 mg Oral Daily  . sodium chloride  10-40 mL Intracatheter Q12H  . vancomycin  250 mg Oral 4 times per day   Continuous Infusions: . sodium chloride 75 mL/hr at 01/20/15 1210    Principal Problem:   FTT (failure to thrive) in adult Active Problems:   Breast cancer of right female breast s/p palliative mastectomy 02/17/14   Breast cancer metastasized to brain   Protein-calorie malnutrition, severe   C. difficile colitis   Palliative care encounter   Goals of care,  counseling/discussion    Time spent: 45 minutes. Greater than 50% of time was spent in discussing palliative care with patient's spouse.    Vernell Leep, MD, FACP, FHM. [facility] Pager [phone]  If 7PM-7AM, please contact night-coverage [email or web address] Password [email or web address] 01/20/2015, 3:31 PM    LOS: 5 days

## 2015-01-21 ENCOUNTER — Telehealth: Payer: Self-pay

## 2015-01-21 DIAGNOSIS — A047 Enterocolitis due to Clostridium difficile: Secondary | ICD-10-CM

## 2015-01-21 MED ORDER — DRONABINOL 5 MG PO CAPS: 5.0000 mg | ORAL_CAPSULE | Freq: Three times a day (TID) | ORAL | Status: AC

## 2015-01-21 MED ORDER — MIRTAZAPINE 30 MG PO TABS: 30.0000 mg | ORAL_TABLET | Freq: Every day | ORAL | Status: AC

## 2015-01-21 MED ORDER — VANCOMYCIN HCL 125 MG PO CAPS
125.0000 mg | ORAL_CAPSULE | Freq: Four times a day (QID) | ORAL | Status: AC
Start: 2015-01-21 — End: 2015-02-06

## 2015-01-21 MED ORDER — HEPARIN SOD (PORK) LOCK FLUSH 100 UNIT/ML IV SOLN
500.0000 [IU] | INTRAVENOUS | Status: AC | PRN
  Administered 2015-01-21: 500 [IU]

## 2015-01-21 MED ORDER — ENSURE ENLIVE PO LIQD: 237.0000 mL | Freq: Three times a day (TID) | ORAL | Status: AC

## 2015-01-21 MED ORDER — HYDROMORPHONE HCL 2 MG PO TABS: 6.0000 mg | ORAL_TABLET | ORAL | Status: AC | PRN

## 2015-01-21 MED ORDER — SODIUM CHLORIDE 0.9 % IV SOLN: 1000.0000 mL | INTRAVENOUS | Status: AC

## 2015-01-21 MED ORDER — METHYLPHENIDATE HCL 5 MG PO TABS: 5.0000 mg | ORAL_TABLET | Freq: Every day | ORAL | Status: DC

## 2015-01-21 MED ORDER — DEXAMETHASONE 4 MG PO TABS: 4.0000 mg | ORAL_TABLET | Freq: Every day | ORAL | Status: AC

## 2015-01-21 NOTE — Progress Notes (Signed)
Daily Progress Note   Patient Name: Carrie Berry       Date: 01/21/2015 DOB: December 08, 1950  Age: 64 y.o. MRN#: [id number] Attending Physician: Modena Jansky, MD Primary Care Physician: [name], MD Admit Date: 01/15/2015  Reason for Consultation/Follow-up: Establishing goals of care  Subjective: 64 y/o woman with metastatic breast cancer to brain, liver and lung admitted at request of Dr. [name]. She has been getting weaker at home, has not been eating or drinking much, is having difficulty getting out of bed and has been very confused. Dr. [name] recommended increasing decadron from 2 to 8 mg, but this did not immediately improve her symptoms and she was admitted to the hospital. Dr. [name] has discussed hospice on multiple occasions, but her husband has been very reluctant.    Interval Events: I met with Carrie Berry and her husband along with the patient's parents and [facility] liaison Stacie.   Plan is for the patient to go home with hospice support.  Discussed with patient and her husband in detail along with [facility] liaison. The patient's husband requests pain medication to be changed to PRN instead of scheduled, in his presence, the patient denies any pain.  Medical equipment such as a hospital be to be delivered to the patient's home soon.  Appreciate [facility] assistance in setting up home hospice arrangements. Anticipate continued hospice support at home will ensure trust building with the patient's husband so that we can continue the conversations about full scope of comfort measures and dnr dni etc.  [facility] has agreed to consider IVF on a PRN basis at home.       Length of Stay: 6 days  Current Medications: Scheduled Meds:  . acyclovir  400 mg Oral BID  . cholecalciferol  1,000 Units Oral Daily  . dexamethasone  4 mg Oral Daily  . dronabinol  5 mg Oral TID AC  . enoxaparin  95 mg Subcutaneous Q24H  . feeding supplement (ENSURE ENLIVE)  237 mL Oral TID BM  . fluconazole  200  mg Oral Daily  . HYDROmorphone  4 mg Oral QID  . levETIRAcetam  1,000 mg Oral BID  . methylphenidate  5 mg Oral Daily  . metoCLOPramide  5 mg Oral TID AC  . mirtazapine  30 mg Oral QHS  . saccharomyces boulardii  250 mg Oral Daily  . sodium chloride  10-40 mL Intracatheter Q12H  . vancomycin  250 mg Oral 4 times per day    Continuous Infusions: . sodium chloride 50 mL/hr at 01/20/15 1743    PRN Meds: acetaminophen **OR** [DISCONTINUED] acetaminophen, HYDROmorphone, ondansetron **OR** ondansetron (ZOFRAN) IV, sodium chloride  Palliative Performance Scale: 30%     Vital Signs: BP 121/82 mmHg  Pulse 79  Temp(Src) 98.5 F (36.9 C) (Oral)  Resp 18  Ht $R'5\' 4"'cr$  (1.626 m)  Wt 65.8 kg (145 lb 1 oz)  BMI 24.89 kg/m2  SpO2 95% SpO2: SpO2: 95 % O2 Device: O2 Device: Not Delivered O2 Flow Rate:    Intake/output summary:   Intake/Output Summary (Last 24 hours) at 01/21/15 1319 Last data filed at 01/21/15 1033  Gross per 24 hour  Intake 1602.92 ml  Output    625 ml  Net 977.92 ml   LBM:   Baseline Weight: Weight: 64.7 kg (142 lb 10.2 oz) Most recent weight: Weight: 65.8 kg (145 lb 1 oz)  Physical Exam: General: Cachectic,  in no acute distress.  Chest appears clear Abdomen: Soft,   Ext: No  significant edema Skin: Warm and dry Neuro: Grossly intact, nonfocal.            Additional Data Reviewed: Recent Labs     01/19/15  0448  WBC  13.2*  HGB  11.3*  PLT  161  NA  141  BUN  18  CREATININE  0.31*     Problem List:  Patient Active Problem List   Diagnosis Date Noted  . Palliative care encounter   . Goals of care, counseling/discussion   . Sepsis 12/27/2014  . Abdominal pain 12/27/2014  . Lumbar pain 12/23/2014  . Hyperphosphatemia 12/23/2014  . Transaminitis 12/23/2014  . Hypoalbuminemia 12/23/2014  . Long term current use of anticoagulant therapy 12/23/2014  . Clostridium difficile colitis 11/25/2014  . C. difficile colitis 11/13/2014  . Generalized  weakness 11/10/2014  . Difficulty swallowing 10/15/2014  . DVT (deep venous thrombosis) 08/27/2014  . Back spasm 08/06/2014  . Skin breakdown 07/30/2014  . Elevated liver enzymes 07/30/2014  . Thrush 05/28/2014  . Protein-calorie malnutrition, severe 03/20/2014  . Breast cancer metastasized to brain 03/19/2014  . FTT (failure to thrive) in adult 03/19/2014  . Weakness 03/19/2014  . Lung metastases 03/12/2014  . Liver metastases 03/12/2014  . Constipation 03/12/2014  . Physical deconditioning 03/07/2014  . Diarrhea 03/07/2014  . Primary cancer of right breast with metastasis to other site 01/26/2014  . Rash, skin 10/13/2013  . Insomnia 10/13/2013  . Epiphora 04/29/2013  . GERD (gastroesophageal reflux disease) 03/19/2013  . Breast cancer of right female breast s/p palliative mastectomy 02/17/14 12/25/2012     Palliative Care Assessment & Plan    Code Status:  Limited code  Goals of Care:  Patient's husband is accepting of hospice support, he is accepting of home with hospice D/C plan, how ever, he is insistent on the patient getting IVF at least on a prn basis if she is dehydrated.    Symptom Management: Pain:  Patient apparently has expressed to bedside nursing staff and also to hospice liaison Stacie that she always has at least a 4-7/10 pain all the time. How ever, the patient has been refusing to take her scheduled Dilaudid. The patient's husband is insisting that the pain medications be PRN instead of scheduled. Will discuss with Dr Algis Liming about pain regimen. It may be that the patient will have to try Dilaudid PO PRN or go to Oxycodone PO PRN to control her pain for now.   Psycho-social/Spiritual:  Desire for further Chaplaincy support:yes   Prognosis: weeks Discharge Planning: likely home with hospice.    Care plan was discussed with patient, her husband, Dr Algis Liming, patient's parents, patient's hospice liaison [place].   Thank you for allowing the Palliative  Medicine Team to assist in the care of this patient.   Time In: 1250 Time Out: 1315 Total Time 25 Prolonged Time Billed  no     Greater than 50%  of this time was spent counseling and coordinating care related to the above assessment and plan.   Carrie Chance, MD  01/21/2015, 1:19 PM  Please contact Palliative Medicine Team phone at [phone] for questions and concerns.  [phone]

## 2015-01-21 NOTE — Progress Notes (Signed)
Pt for discharge to home with hospice.   CSW received notification that pt needed ambulance transport home.   CSW spoke with pt husband who reports that equipment will be delivered this evening. CSW confirmed address with pt husband. Pt husband states the he will notify pt RN once equipment arrives to the pt home.   CSW notified RN who is agreeable to arranging ambulance transport for pt to home. RN to call [phone] option 1 for English and option 3 for non-emergency ambulance transport.   Pt address for transport:  [place] [id number]  Ambulance transport packet completed.   No further social work needs identified at this time.  CSW signing off.   [name], MSW, [place] [facility] [phone]

## 2015-01-21 NOTE — Progress Notes (Signed)
Addendum  Transport was here to pick up patient but patient did not have DO NOT RESUSCITATE or MOST form and they insisted on having one of these completed for discharge. Patient is a partial code. Discussed at length with palliative care M.D./director who guided this M.D. to complete the MOST form based on patient's documented wishes in [place].   Carrie Leep, MD, FACP, FHM. [facility] Pager [phone]  If 7PM-7AM, please contact night-coverage [email or web address] Password [facility] 01/21/2015, 6:46 PM

## 2015-01-21 NOTE — Discharge Summary (Signed)
Physician Discharge Summary  Carrie Berry XIP:[id number] DOB: 1950/10/13 DOA: 01/15/2015  PCP: [name], MD  Admit date: 01/15/2015 Discharge date: 01/21/2015  Time spent: Greater than 30 minutes  Recommendations for Outpatient Follow-up:  1. Home hospice will continue to follow patient at home 2. Home health services for when necessary IV fluids at home 3. Dr. [name], Oncology: will continue to be hospice M.D. as outpatient  Discharge Diagnoses:  Principal Problem:   FTT (failure to thrive) in adult Active Problems:   Breast cancer of right female breast s/p palliative mastectomy 02/17/14   Breast cancer metastasized to brain   Protein-calorie malnutrition, severe   C. difficile colitis   Palliative care encounter   Goals of care, counseling/discussion   Discharge Condition: Improved & Stable  Diet recommendation: Regular diet  Filed Weights   01/15/15 1451 01/20/15 0826  Weight: 64.7 kg (142 lb 10.2 oz) 65.8 kg (145 lb 1 oz)    History of present illness:  64 y/o woman with metastatic breast cancer to brain, liver and lung for whom direct admission by Dr. [name] was requested for evaluation of progressive weakness, poor oral intake, FTT, confusion at home.  Hospital Course:   Acute encephalopathy - metabolic and secondary to FTT, dehydration - Patient's mental status fluctuates related to pain medications. As per nursing, when alert she is quite coherent and then has periods of lethargy. - She is at risk of recurrent mental status changes related to brain metastases, pain medications.  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 He recommends either home with hospice (spouse states that he may  not be able to handle), residential hospice versus SNF with hospice. As per oncology, spouse has been resistant/reluctant in the past 2 hospice. - Discussed extensively with patient's spouse and patient at bedside on 9/7 and then again with spouse in private and explained in detail regarding end-stage cancer diagnosis and likelihood of progressive decline and focus to be one of comfort. He eventually seems to be coming along and expressing openness to residential hospice. Case management and social worker consulted. Discussed with palliative care M.D. as well. - Primary oncologist and palliative care follow-up appreciated. Patient and spouse at this time decline becoming place and would like to go home with hospice on when necessary IV fluids for dehydration. They also do not want scheduled pain medications and hence we'll continue prior home regimen of when necessary pain medications. - Patient will go home with Foley catheter for comfort and Port-A-Cath accessed for home IV fluids.  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 Improved. Defer decision to taper oral vancomycin dose to outpatient follow-up.  Failure to thrive with severe PCM - in the setting of chronic illness, metastatic breast cancer  - nutritionist consulted - Appetite better on Marinol. Did not see a role for Reglan and hence discontinued.  Chronic systolic heart failure EF 40-45% - Appears to be compensated monitor closely  Transaminitis - likely from hepatic metastases  - no need for trending   Anemia of chronic disease, malignancy, macrocytic  - no  signs of active bleeding. Stable  Functional quadriplegia - bed bound for now due to progressive nature of malignancy   Consultants:  Medical Oncology  Palliative care  medicine  Procedures:  None   Discharge Exam:  Complaints: Complained of 5-6/10 upper abdominal pain. Appetite continues to be poor but dislikes hospital food and was planning to ask spouse to bring food from outside. Diarrhea decreased. No other complaints reported.  Filed Vitals:   01/20/15 1303 01/20/15 2110 01/21/15 0536 01/21/15 1333  BP: 119/72 103/67 121/82 121/63  Pulse: 100 90 79 91  Temp: 97.9 F (36.6 C) 97.8 F (36.6 C) 98.5 F (36.9 C) 98.3 F (36.8 C)  TempSrc: Oral Oral Oral Oral  Resp: 18 18 18 16   Height:      Weight:      SpO2: 96% 93% 95% 99%    General exam: Small built and cachectic chronically ill-looking middle-aged female sitting up comfortably propped up in bed this morning. Respiratory system: Reduced breath sounds bilaterally. No increased work of breathing. Port-A-Cath right upper anterior chest. Cardiovascular system: S1 & S2 heard, RRR. No JVD, murmurs, gallops, clicks or pedal edema. Gastrointestinal system: Abdomen is nondistended, soft and nontender. Normal bowel sounds heard. Foley catheter in place. Central nervous system: Alert and oriented. No focal neurological deficits. Extremities: Symmetric 5 x 5 power.  Discharge Instructions      Discharge Instructions    Call MD for:  difficulty breathing, headache or visual disturbances    Complete by:  As directed      Call MD for:  extreme fatigue    Complete by:  As directed      Call MD for:  hives    Complete by:  As directed      Call MD for:  persistant dizziness or light-headedness    Complete by:  As directed      Call MD for:  persistant nausea and vomiting    Complete by:  As directed      Call MD for:  redness, tenderness, or signs of infection (pain, swelling, redness, odor or green/yellow discharge around incision site)    Complete by:  As directed      Call MD for:  severe uncontrolled pain    Complete by:  As directed      Call MD for:  temperature >100.4    Complete  by:  As directed      Diet general    Complete by:  As directed      Increase activity slowly    Complete by:  As directed             Medication List    STOP taking these medications        omeprazole 20 MG capsule  Commonly known as:  PRILOSEC     sulfamethoxazole-trimethoprim 800-160 MG per tablet  Commonly known as:  BACTRIM DS,SEPTRA DS     traZODone 50 MG tablet  Commonly known as:  DESYREL      TAKE these medications        acetaminophen 500 MG tablet  Commonly known as:  TYLENOL  Take 1,000 mg by mouth every 6 (six) hours as needed for mild pain, moderate pain or headache.     acyclovir 400 MG tablet  Commonly known as:  ZOVIRAX  Take 1 tablet (400 mg total) by mouth 2 (two) times daily.     cholecalciferol 1000 UNITS tablet  Commonly known as:  VITAMIN D  Take 1,000 Units by mouth daily.     [employer] GAS RELIEF 80 MG chewable tablet  Generic drug:  simethicone  CHEW 1 TABLET BY MOUTH EVERY 6 (SIX) HOURS AS NEEDED FOR FLATULENCE.     dexamethasone 4 MG tablet  Commonly known as:  DECADRON  Take 1 tablet (4 mg total) by mouth daily.     dronabinol 5 MG capsule  Commonly known as:  MARINOL  Take 1 capsule (5 mg total) by mouth 3 (three) times daily before meals.     enoxaparin 100 MG/ML injection  Commonly known as:  LOVENOX  INJECT 0.95MLS (95MG  TOTAL) INTO THE SKIN DAILY     feeding supplement (ENSURE ENLIVE) Liqd  Take 237 mLs by mouth 3 (three) times daily between meals.     fluconazole 100 MG tablet  Commonly known as:  DIFLUCAN  Take 100 mg by mouth daily.     HYDROmorphone 2 MG tablet  Commonly known as:  DILAUDID  Take 3 tablets (6 mg total) by mouth every 4 (four) hours as needed for severe pain.     levETIRAcetam 1000 MG tablet  Commonly known as:  KEPPRA  TAKE 1 TABLET (1,000 MG TOTAL) BY MOUTH EVERY 12 (TWELVE) HOURS.     lidocaine-prilocaine cream  Commonly known as:  EMLA  Apply 1 application topically daily as needed (prior to  port being access). Apply to affected area once     methylphenidate 5 MG tablet  Commonly known as:  RITALIN  Take 1 tablet (5 mg total) by mouth daily.     mirtazapine 30 MG tablet  Commonly known as:  REMERON  Take 1 tablet (30 mg total) by mouth at bedtime.     multivitamin with minerals Tabs tablet  Take 1 tablet by mouth daily.     PROBIOTIC PO  Take 1 capsule by mouth daily.     prochlorperazine 10 MG tablet  Commonly known as:  COMPAZINE  TAKE 1 TABLET (10 MG TOTAL) BY MOUTH EVERY 6 (SIX) HOURS AS NEEDED (NAUSEA OR VOMITING).     RA KRILL OIL PO  Take 1 capsule by mouth daily.     sodium chloride 0.9 % infusion  Inject 1,000 mLs into the vein continuous. At 75 mL per hour 12 hours daily when necessary for poor oral intake and dehydration.     vancomycin 125 MG capsule  Commonly known as:  VANCOCIN  Take 1 capsule (125 mg total) by mouth 4 (four) times daily.       Follow-up Information    Schedule an appointment as soon as possible for a visit with Chauncey Cruel, MD.   Specialty:  Oncology   Why:  As needed, If symptoms worsen   Contact information:   [place] [address] [phone]       Follow up with Home Hospice.   Why:  will continue to follow at home.       The results of significant diagnostics from this hospitalization (including imaging, microbiology, ancillary and laboratory) are listed below for reference.    Significant Diagnostic Studies: Dg Chest 1 View  01/13/2015   CLINICAL DATA:  64 year old female with right side chest pain and fever since this morning. Current history of metastatic breast cancer. Subsequent encounter.  EXAM: CHEST  1 VIEW  COMPARISON:  12/27/2014 and earlier.  FINDINGS: Multiple bilateral lung nodules re- identified compatible with known pulmonary metastatic disease. These are largest at the left lung base as  before. Suggestion of small left pleural effusion, or alternatively left pleural  scarring. Stable cardiac size and mediastinal contours. Stable right chest porta cath. No pneumothorax or pulmonary edema. No consolidation.  IMPRESSION: No new cardiopulmonary abnormality. Pulmonary metastatic disease re- identified.   Electronically Signed   By: Genevie Ann M.D.   On: 01/13/2015 14:13   Dg Chest 2 View  12/27/2014   CLINICAL DATA:  Shortness of breath.  EXAM: CHEST  2 VIEW  COMPARISON:  November 10, 2014.  FINDINGS: Stable cardiomegaly. Right internal jugular Port-A-Cath is unchanged in position with distal tip overlying expected position of the SVC. No pneumothorax or pleural effusion is noted. Two nodules are noted in the left upper lobe concerning for metastatic disease. Stable blunting of left costophrenic sulcus is noted consistent with small effusion or scarring. Small nodule is noted in right upper lobe.  IMPRESSION: Bilateral pulmonary nodules are noted concerning for metastatic disease. Stable blunting of left costophrenic sulcus consistent with scarring or small pleural effusion.   Electronically Signed   By: Marijo Conception, M.D.   On: 12/27/2014 14:25   Mr Jeri Cos UR Contrast  01/12/2015   CLINICAL DATA:  Breast cancer, stage IV. Whole-brain radiation completed 03/2014. Continued surveillance.  EXAM: MRI HEAD WITHOUT AND WITH CONTRAST  TECHNIQUE: Multiplanar, multiecho pulse sequences of the brain and surrounding structures were obtained without and with intravenous contrast.  CONTRAST:  29mL MULTIHANCE GADOBENATE DIMEGLUMINE 529 MG/ML IV SOLN  COMPARISON:  MRI brain 10/30/2014  FINDINGS: Multiple intracranial metastatic deposits are redemonstrated. Within limits for assessment due to significant background post treatment leukoencephalopathy, no significant vasogenic edema.  - RIGHT cerebellum, 10 mm, image 39.  - LEFT occipital lobe, 2 mm,  image 65.  - RIGHT posterior temporal lobe, 9 mm, image 74.  - RIGHT thalamus, 2 mm, image 76.  - RIGHT occipital white matter, 3 mm, image 79.  -  RIGHT periatrial white matter, 2 mm, image 83.  - RIGHT posterior centrum semiovale, 5 mm, image 94.  - RIGHT posterior frontal subcortical white matter, 3 mm, image 96.  -RIGHT superior frontal subcortical white matter, 2 mm, image 114.  -RIGHT superior frontal pole subcortical white matter, undetectable, image 120.  There are few lesions which were observed on the June scan, 1 mm in size, difficult to visualize on today's study.  Global atrophy. Severe white matter disease, stable. Tiny foci of chronic hemorrhage associated with treated lesions, stable. No osseous disease. No sinus air-fluid level. Negative orbits. Increased middle ear and mastoid fluid on the LEFT compared with priors, with suggestion of postcontrast enhancement. LEFT Otitis/mastoiditis not excluded. Correlate clinically. Major dural venous sinuses are patent.  IMPRESSION: At least 10 intracranial metastatic lesions as described, without significant progression from June. A few punctate 1 mm lesions are now difficult to visualize at all.  Specifically, the new lesion which was detected on the June scan, 2 mm in the RIGHT thalamus, has not shown significant progression since that exam.   Electronically Signed   By: Staci Righter M.D.   On: 01/12/2015 13:33   Ct Abdomen Pelvis W Contrast  12/27/2014   CLINICAL DATA:  64 year old female on chemotherapy for metastatic breast cancer. Fever, abdominal pain, foul smelling urine. Initial encounter.  EXAM: CT ABDOMEN AND PELVIS WITH CONTRAST  TECHNIQUE: Multidetector CT imaging of the abdomen and pelvis was performed using the standard protocol following bolus administration of intravenous contrast.  CONTRAST:  15mL OMNIPAQUE IOHEXOL 300 MG/ML  SOLN  COMPARISON:  CT Abdomen and Pelvis 11/10/2014 and earlier  FINDINGS: Lower lobe lung metastases appear mildly progressed since June, now all ranging from 12-18 mm diameter (11-14 mm previously). Superimposed pulmonary atelectasis. No pericardial or  pleural effusion.  There is a new mild superior endplate fracture of the L2 vertebral body. No overt metastatic disease identified in that vertebra. No destructive osseous lesion identified elsewhere.  Negative uterus and adnexa. No pelvic free fluid. Decompressed distal colon. There is mild bladder wall thickening and hyper enhancement compared to the prior study (series 2, image 82). No gas within the bladder. There is mild perivesical stranding.  Decompressed sigmoid colon. Negative left colon and distal transverse colon. Retained stool from the cecum to the proximal transverse colon. Negative terminal ileum. Appendix appears to remain normal. No dilated small bowel. Decompressed stomach and duodenum.  Numerous subcutaneous nodules in the ventral abdominal wall, likely related to subcutaneous injections. Progressed hypodense liver lesions, increased in size and number since June. Confluent right lobe involvement. There may be some associated right lobe biliary dilatation, uncertain. Trace perihepatic free fluid. The portal venous system remains patent.  Negative gallbladder, spleen, pancreas and adrenal glands. Renal enhancement and contrast excretion within normal limits. Small retroperitoneal lymph nodes are stable.  There is evidence of nonocclusive thrombus in both external iliac veins and both common femoral veins, appears more extensive on the left.  IMPRESSION: 1. Progressed pulmonary and hepatic metastatic disease since June. 2. Nonocclusive bilateral pelvic and femoral vein thrombus (nonocclusive DVTs). 3. Mild urinary bladder wall thickening and stranding compatible with urinary tract infection. 4. Acute to subacute L2 compression fracture which could be related to osteoporosis or metastatic disease.   Electronically Signed   By: Genevie Ann M.D.   On: 12/27/2014 16:22   Dg Abd Acute W/chest  01/16/2015   CLINICAL DATA:  Fever and abdominal pain. Patient with stage IV breast cancer.  EXAM: DG ABDOMEN  ACUTE W/ 1V CHEST  COMPARISON:  01/13/2015 and prior exams  FINDINGS: A right IJ Port-A-Cath with tip overlying the lower SVC again noted.  Bilateral pulmonary nodules are again identified.  Elevation of the right hemidiaphragm is again noted.  There is no evidence of airspace disease, pleural effusion or pneumothorax.  The bowel gas pattern is unremarkable.  There is no evidence of bowel obstruction or pneumoperitoneum.  No suspicious calcifications or acute bony abnormalities identified.  IMPRESSION: No acute abnormality.  Unremarkable bowel gas pattern.  Pulmonary nodules/metastases again noted.   Electronically Signed   By: Margarette Canada M.D.   On: 01/16/2015 14:29   US Abdomen Limited Ruq  12/27/2014   CLINICAL DATA:  Abdominal pain x2 days, elevated LFTs  EXAM: US ABDOMEN LIMITED - RIGHT UPPER QUADRANT  COMPARISON:  CT abdomen pelvis dated 12/27/2014.  FINDINGS: Gallbladder:  Contracted gallbladder. Secondary gallbladder wall thickening. No gallstones or pericholecystic fluid. Negative sonographic Murphy's sign.  Common bile duct:  Diameter: 2 mm.  Liver:  Heterogeneous appearance of the liver. Numerous ill-defined hypoechoic metastases are better visualized on recent CT abdomen pelvis.  IMPRESSION: Contracted gallbladder without associated sonographic findings to suggest acute cholecystitis.  Numerous ill-defined hypoechoic metastases, better evaluated on CT.   Electronically Signed   By: Julian Hy M.D.   On: 12/27/2014 18:02    Microbiology: Recent Results (from the past 240 hour(s))  Urine culture     Status: None   Collection Time: 01/16/15  5:57 AM  Result Value Ref Range Status   Specimen Description  URINE, RANDOM  Final   Special Requests NONE  Final   Culture   Final    10,000 COLONIES/mL CITROBACTER FREUNDII Performed at [facility]    Report Status 01/18/2015 FINAL  Final   Organism ID, Bacteria CITROBACTER FREUNDII  Final      Susceptibility   Citrobacter freundii -  MIC*    CEFAZOLIN >=64 RESISTANT Resistant     CEFTRIAXONE <=1 SENSITIVE Sensitive     CIPROFLOXACIN <=0.25 SENSITIVE Sensitive     GENTAMICIN <=1 SENSITIVE Sensitive     IMIPENEM <=0.25 SENSITIVE Sensitive     NITROFURANTOIN <=16 SENSITIVE Sensitive     TRIMETH/SULFA <=20 SENSITIVE Sensitive     PIP/TAZO <=4 SENSITIVE Sensitive     * 10,000 COLONIES/mL CITROBACTER FREUNDII  Urine culture     Status: None   Collection Time: 01/17/15  5:17 AM  Result Value Ref Range Status   Specimen Description URINE, RANDOM  Final   Special Requests NONE  Final   Culture   Final    MULTIPLE SPECIES PRESENT, SUGGEST RECOLLECTION Performed at [facility]    Report Status 01/19/2015 FINAL  Final     Labs: Basic Metabolic Panel:  Recent Labs Lab 01/15/15 1626 01/17/15 0500 01/18/15 0430 01/19/15 0448  NA 138 139 138 141  K 4.2 4.1 4.0 4.0  CL 101 107 105 107  CO2 25 25 27 26   GLUCOSE 133* 109* 126* 145*  BUN 15 20 18 18   CREATININE 0.53 0.50 0.47 0.31*  CALCIUM 9.8 9.2 9.1 9.2  MG  --  1.9  --   --    Liver Function Tests:  Recent Labs Lab 01/15/15 1626  AST 119*  ALT 120*  ALKPHOS 940*  BILITOT 0.9  PROT 6.8  ALBUMIN 2.7*   No results for input(s): LIPASE, AMYLASE in the last 168 hours. No results for input(s): AMMONIA in the last 168 hours. CBC:  Recent Labs Lab 01/15/15 1626 01/17/15 0500 01/18/15 0430 01/19/15 0448  WBC 23.2* 15.2* 15.2* 13.2*  NEUTROABS 21.7*  --   --   --   HGB 12.9 11.7* 11.6* 11.3*  HCT 40.0 37.0 37.9 36.3  MCV 99.8 101.4* 101.3* 101.7*  PLT 262 190 205 161   Cardiac Enzymes: No results for input(s): CKTOTAL, CKMB, CKMBINDEX, TROPONINI in the last 168 hours. BNP: BNP (last 3 results) No results for input(s): BNP in the last 8760 hours.  ProBNP (last 3 results) No results for input(s): PROBNP in the last 8760 hours.  CBG: No results for input(s): GLUCAP in the last 168 hours.   Discussed with Dr. [name], palliative care  M.D. and case management.   Signed:  Vernell Leep, MD, FACP, FHM. [facility] Pager [phone]  If 7PM-7AM, please contact night-coverage [email or web address] Password [email or web address] 01/21/2015, 2:05 PM

## 2015-01-21 NOTE — Progress Notes (Signed)
Spoke with Pt and husband at bedside concerning discharge plans.  Both selected [facility], referral was called to [place]. Plan to dc today.

## 2015-01-21 NOTE — Telephone Encounter (Signed)
Team B[facility] contact on 01/20/15 by Janett Billow, RN Elvina Sidle RN who needed to speak to the on call physician.  She spoke with Dr. Alvy Bimler and was given the  Contact information for the facility.

## 2015-01-21 NOTE — Discharge Instructions (Signed)
Failure to Thrive, Adult °Adult failure to thrive is a condition that some older people develop. People with this condition are able to do fewer and fewer activities over time. They may lose interest in being with friends or may not want to eat or drink. This is not a normal part of aging. Many things can cause this. Health problems, long-term disease, depression, bad eating habits, cognitive impairment, or disability may play a role in the development of this condition. Most of the time, it is important to treat whatever is causing failure to thrive. Sometimes, though, it might not be possible to treat the condition. The person could be nearing the end of life. Then, treatment could make the person suffer longer. °CAUSES °Sometimes, no specific cause can be found. Factors that have been linked to failure to thrive include: °· Diseases and medical conditions, such as: °¨ Cancer. °¨ Diabetes. °¨ Stomach and intestinal (gastrointestinal) problems. °¨ Lung disease. °¨ Liver disease. °¨ Kidney disease. °¨ Heart problems. °¨ Thyroid disease. °¨ Neurologic problems. °¨ Vitamin deficiencies. °· Disability. This may be the result of: °¨ A broken hip. °¨ A stroke. °¨ Very bad arthritis. °¨ Infections that last a long time. °¨ A long recovery from a surgery. °¨ Mental health issues. °¨ Medicines. °¨ Eating problems. °· Medicine for certain conditions. These conditions include: °¨ Parkinson's disease. °¨ Seizure disorder. °¨ Anxiety. °¨ Pain. °¨ High blood pressure. °¨ Depression. °¨ Infections. °SYMPTOMS °· Losing weight (more than 5% of total body weight). °· Getting more tired than usual after an activity. °· Having trouble getting up after sitting. °· Not being hungry or thirsty. °· Not getting out of bed. °· Not wanting to do usual activities. °· Being depressed. °· Getting infections often. °· Having bedsores. °· Taking a long time to recover after an injury or a surgery. °· Weakness. °DIAGNOSIS °A physical exam can help  a caregiver decide if someone has adult failure to thrive. This may include questions about the person's health presently and in the past. It also may include questions about behavior and mood, such as: °· Has activity changed? °· Does the person seem sad? °· Are eating habits different? °The caregiver may ask for a list of all medicines taken because certain medicines can lead to this condition. The list should include prescription and over-the-counter medicines. The caregiver will likely order some tests. These may include: °· Blood tests to check for infection, certain diseases, deficiencies, hormone levels, malnutrition, or dehydration. °· Urine tests to check for urinary tract infection or kidney failure. °· Imaging tests. Examples are an X-ray, a computed tomography (CT) scan, and magnetic resonance imaging (MRI). °· Hearing tests. °· Vision tests. °· Cognitive tests to check thinking ability. °· Activity tests to see if the person can do basic tasks like bathing and dressing. There also are tests to check if someone can shop, cook, or move around safely. °The caregiver will check if the person is eating enough healthy food. This may include: °· Checking weight. °· Having blood tested for cholesterol and protein levels. °· Seeing if anything else might be making it hard to eat (tooth problems, poorly fitted dentures, trouble swallowing). °The person may need to see a specialist to help with diagnosis or treatment. These specialists may include a speech therapist, physical therapist, occupational therapist, dietitian, or social worker.  °TREATMENT °Treatment for adult failure to thrive depends on the cause. Caregivers also must decide if a treatment has a good chance of working. It   often takes a team of caregivers to find the right treatment. Options may include: °· Treatments to cure a disease that can cause adult failure to thrive. °· Talk therapy or medicine to treat depression. °· A better diet. Eating more  often, adding nutritional supplements between meals, or taking vitamins may be suggested. Sometimes, medicine is prescribed to boost appetite. °· Medicine changes or stopping a medicine. °· Physical therapy. °· Moving to a place that offers more aid. °HOME CARE INSTRUCTIONS °What needs to be done at home varies from person to person. This will depend on what caused the condition and how it is treated. However, basic guidelines include: °· Taking any medicine prescribed by the caregiver. Following the directions carefully is important. °· Eating healthy foods. There should be enough calories in each meal. Ask the caregiver if vitamins or nutritional supplements should be taken between meals. Consider talking with a dietitian. °· Exercising. Strength training is important. A physical therapist can help set up an exercise program that fits the person. °· Making sure the person is safe at home. °· Talking with caregivers about what should be done if the person can no longer make decisions for himself or herself. °SEEK MEDICAL CARE IF: °· There are any questions about medicines. °· There are questions about the effects of treatment. °· The person is not able to eat well. °· The person is not able to move around. °· The person feels very sad or hopeless. °SEEK IMMEDIATE MEDICAL CARE IF:  °· The person has thoughts of ending his or her life. °· The person cannot eat or drink. °· The person does not get out of bed. °· Staying at home is no longer safe. °· The person has a fever. °Document Released: 07/24/2011 Document Reviewed: 07/24/2011 °[employer]® Patient Information ©2015 [employer]. This information is not intended to replace advice given to you by your health care provider. Make sure you discuss any questions you have with your health care provider. ° °

## 2015-01-21 NOTE — Progress Notes (Signed)
Pt discharge home with hospice in place arrange through [facility]. Transportation arranged  Via [employer]. MOST form filled out and given to crew. Pt discharge home with port accessed and foley as ordered.

## 2015-01-21 NOTE — Progress Notes (Signed)
Notified of family request for [facility] services at home after discharge. Chart and patient Information currently under review to confirm hospice eligibility.   Spoke with [name], at bedside to initiate education related to hospice philosophy, services and team approach to care. Family verbalized understanding of the information provided. Per discussion plan is for discharge to home by [facility] today.   Patient will need prescriptions for discharge comfort medications.   Per family request, please keep F/C in place at discharge.  DME needs discussed and family requested hospital bed and bedside table for delivery to the home today.  [facility] equipment manager Jewel Ysidro Evert notified and will contact [place] to arrange delivery to the home.  The home address has been verified and is correct in the chart; [name] family member to be contacted to arrange time of delivery.  Please make sure equipment is in the home prior to patient discharge.  Completed discharge summary will need to be faxed to [facility] at [phone] when final.  Please notify [facility] when patient is ready to leave unit at discharge-call [phone].   [facility] information and contact numbers have been given to [place] during visit.  Above information shared with Cookie, Therapist, sports.   Please call with any questions.  Annia Belt RN, [facility] Liaison  [phone]

## 2015-01-21 NOTE — Progress Notes (Signed)
COURTESY NOTE:  I met for about 40 minutes w patient's husband yesterday. [name] understand patient is terminal, incurable, and has a very short prognosis-- probably in the 2 week range. Lockie had hoped to live to see their first grandchild, due late October. Fritz Pickerel knows that is not likely to happen.  His feeling is if Bryana can receive IVF she would live longer, and might make it to the birth. That is why he is resistant to [facility], which otherwise he would agree to.  This suggests that if we could arrange for IVF (1 L NS daily, perhaps) at home or at [facility], that would facilitate discharge.  I do not know if [place] would be agreeable to that but I will check with them today   Appreciate your help to this family!

## 2015-01-25 ENCOUNTER — Other Ambulatory Visit: Payer: Self-pay | Admitting: *Deleted

## 2015-01-25 NOTE — Telephone Encounter (Signed)
This RN called pt's home and per CMA with pt- pt " is doing well "  CMA then stated " would you like to speak with Carrie Berry  Carrie Berry states she is good- appreciated call per this inquiry on " how are things now that you are home ".  This RN let Carrie Berry know staff at this office send miss her.  This RN then called Carrie Berry per his cell number- and per inquiry he states overall things are good " she's about the same "  Stated only issue was " she wanted her foley catheter out because she said it was uncomfortable - so removed it " - " but then we had to change the sheets a lot and then the diaper and so we had them put the catheter back in ".  Support given to Carrie Berry per above as well as encouragement to call if needed for any concerns.

## 2015-02-01 ENCOUNTER — Other Ambulatory Visit: Payer: Self-pay | Admitting: Nurse Practitioner

## 2015-02-01 ENCOUNTER — Telehealth: Payer: Self-pay | Admitting: *Deleted

## 2015-02-01 NOTE — Telephone Encounter (Signed)
Called pt to f/u about medication request for Trazodone that was sent to [place] through auto-refill from [facility]. Pt's husband, Legrand Como, confirmed that pt is no longer on this medication but does need a refill on Ritalin, so I will leave Rx for Ritalin in the Rx book at the desk for him to pickup. No further concerns.

## 2015-02-02 ENCOUNTER — Other Ambulatory Visit: Payer: Self-pay | Admitting: *Deleted

## 2015-02-02 DIAGNOSIS — C7931 Secondary malignant neoplasm of brain: Principal | ICD-10-CM

## 2015-02-02 DIAGNOSIS — C50911 Malignant neoplasm of unspecified site of right female breast: Secondary | ICD-10-CM

## 2015-02-02 DIAGNOSIS — C787 Secondary malignant neoplasm of liver and intrahepatic bile duct: Secondary | ICD-10-CM

## 2015-02-02 MED ORDER — METHYLPHENIDATE HCL 5 MG PO TABS: 5.0000 mg | ORAL_TABLET | Freq: Every day | ORAL | Status: AC

## 2015-02-05 ENCOUNTER — Other Ambulatory Visit: Payer: Self-pay | Admitting: Oncology

## 2015-02-09 ENCOUNTER — Other Ambulatory Visit: Payer: Self-pay | Admitting: Nurse Practitioner

## 2015-02-09 ENCOUNTER — Encounter: Payer: Self-pay | Admitting: Radiation Therapy

## 2015-02-09 NOTE — Progress Notes (Signed)
Discussed during 8/31 Conference. Decision was no SRS at that time due to no progression on Brain scan. Recommended to continue follow-up with another scan in 3 mo. Then pt admitted on 9/2 and discharged with Hospice care on 9/8. No MRI or follow-up visit planned for Dr. Tammi Klippel since now under Hospice care.   [facility] [place] Special Procedures Navigator  Rad Onc

## 2015-02-12 ENCOUNTER — Telehealth: Payer: Self-pay | Admitting: *Deleted

## 2015-02-12 NOTE — Telephone Encounter (Signed)
FYI Dr. [name]. Carrie James RN called reporting she is "on her way to see patient who is not doing well.  Dr. [name] gave orders at families request for 2L IVF PRN over the weekend. Awaiting birth of a grand baby.  However she has decreased urine output.  Yesterday's total 24 hr urine output = 3-400 ml.  Today's output = 2-300 ml, irregardless of the two to three 8 oz carnation instant breakfast she drinks daily.  Please let Dr. [name] know I will assess cardiac and pulmonary status.  If any cardiac or pulmonary congestion I will not hang the IVF.    Informed Carrie Berry Dr. [name] returns to office Monday.  Carrie Berry will F/U on Monday as needed.

## 2015-02-15 ENCOUNTER — Telehealth: Payer: Self-pay | Admitting: Oncology

## 2015-02-15 NOTE — Telephone Encounter (Signed)
Received death certificate 02/15/15 ° °

## 2015-02-16 ENCOUNTER — Other Ambulatory Visit: Payer: Self-pay | Admitting: Oncology

## 2015-02-16 NOTE — Telephone Encounter (Signed)
With chart review, H.I.M. Received death certificate [id number].

## 2015-02-17 ENCOUNTER — Ambulatory Visit: Payer: BC Managed Care – PPO

## 2015-02-17 ENCOUNTER — Other Ambulatory Visit: Payer: BC Managed Care – PPO

## 2015-03-16 DEATH — deceased

## 2015-06-23 ENCOUNTER — Encounter: Payer: Self-pay | Admitting: Radiation Therapy

## 2015-06-23 NOTE — Progress Notes (Signed)
   Clip from her obituary

## 2015-10-30 ENCOUNTER — Other Ambulatory Visit: Payer: Self-pay | Admitting: Nurse Practitioner

## 2016-03-13 IMAGING — CT CT HEAD W/O CM
2 series · 15 of 30 positions shown, 19 images · non-contrast
Comparison: MRI 07/15/2013

CLINICAL DATA: 63-year-old female with right mastectomy 1 week
prior.

EXAM:
CT HEAD WITHOUT CONTRAST
TECHNIQUE: Contiguous axial images were obtained from the base of the skull
through the vertex without intravenous contrast.

[Series 201: head w/o, idose (1) · axial · non-contrast · 0.49mm/px · z∈[+70,+195]mm · 13 of 31 slices shown, 17 images]
[im 3/31  brain]
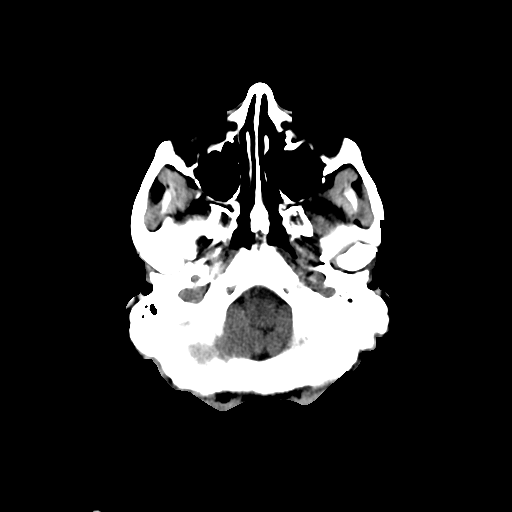
[im 3/31  bone]
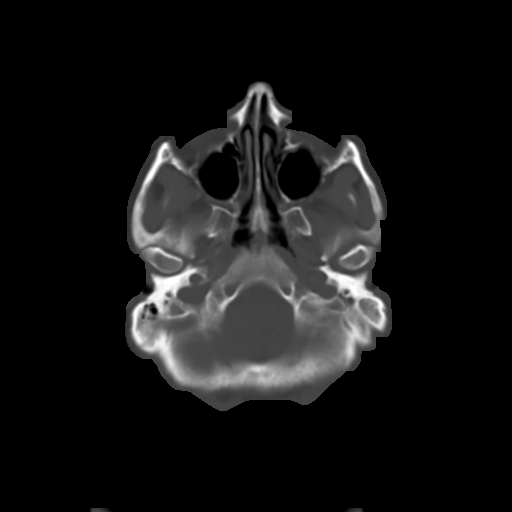
[im 5/31  brain]
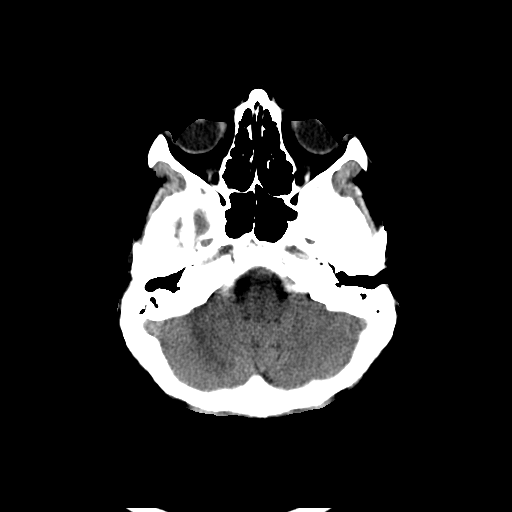
[im 7/31  brain]
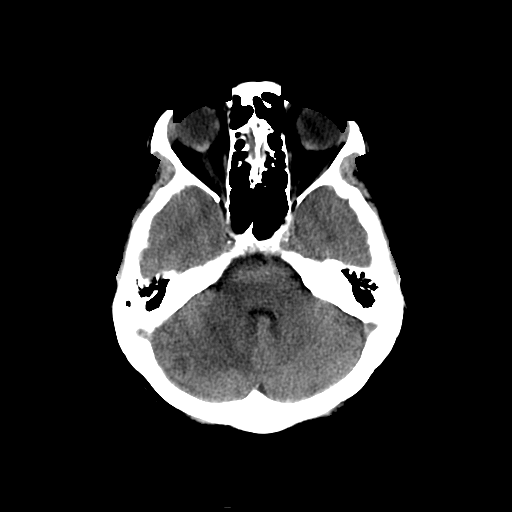
[im 9/31  brain]
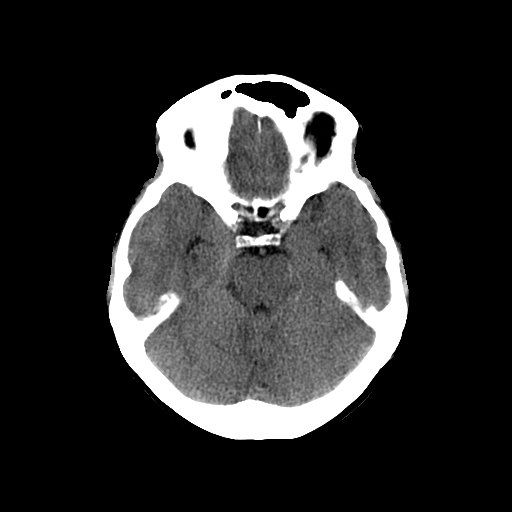
[im 11/31  brain]
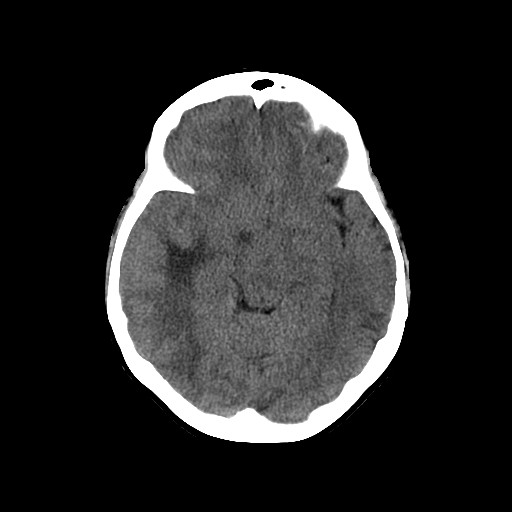
[im 11/31  bone]
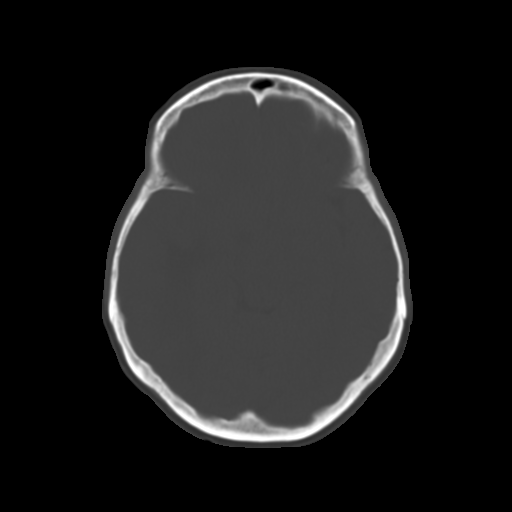
[im 13/31  brain]
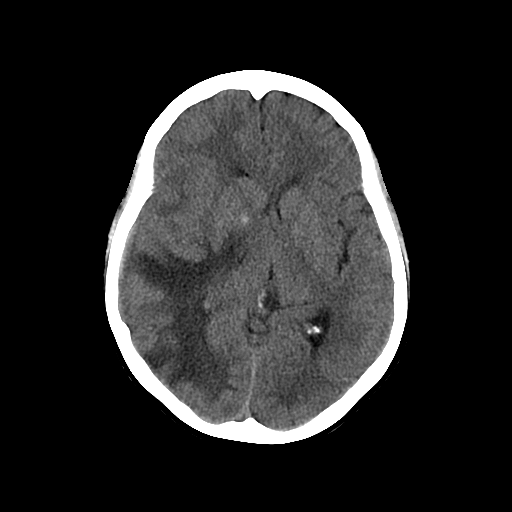
[im 16/31  brain]
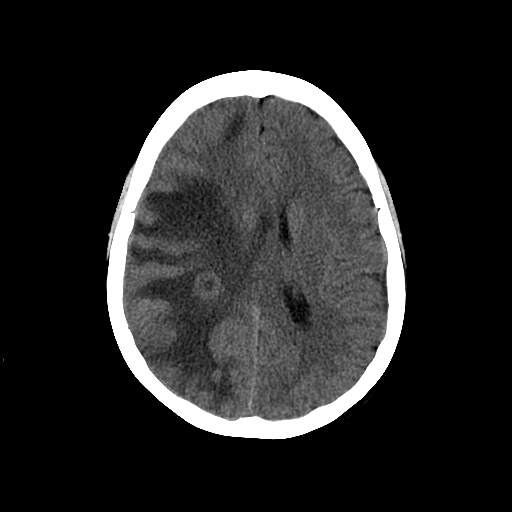
[im 18/31  brain]
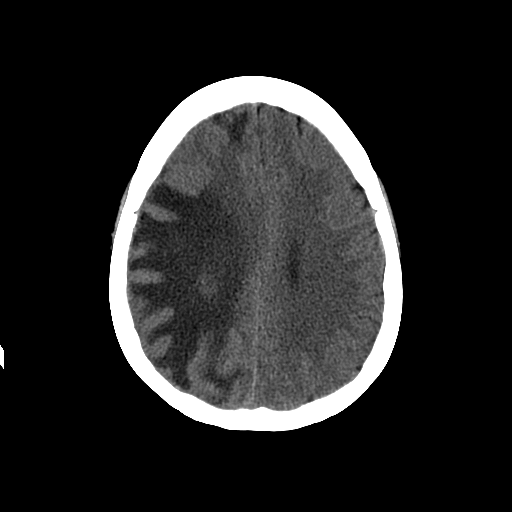
[im 20/31  brain]
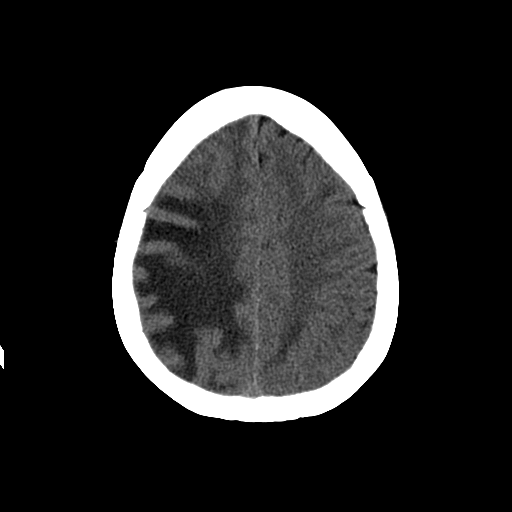
[im 20/31  bone]
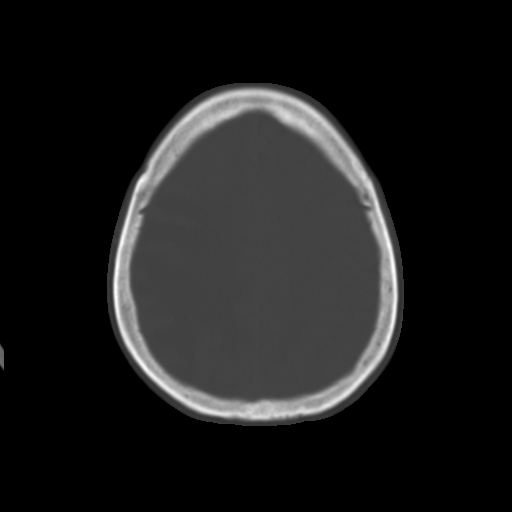
[im 22/31  brain]
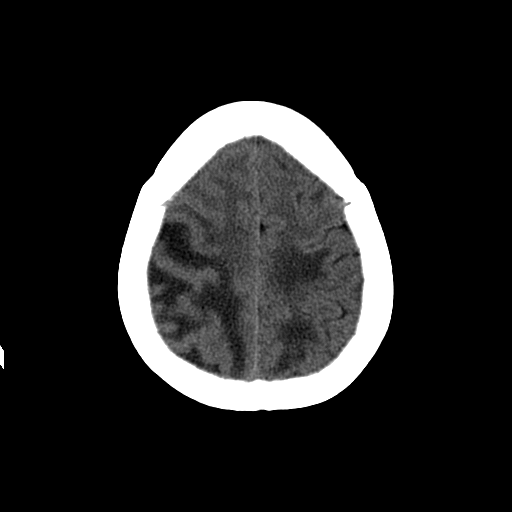
[im 24/31  brain]
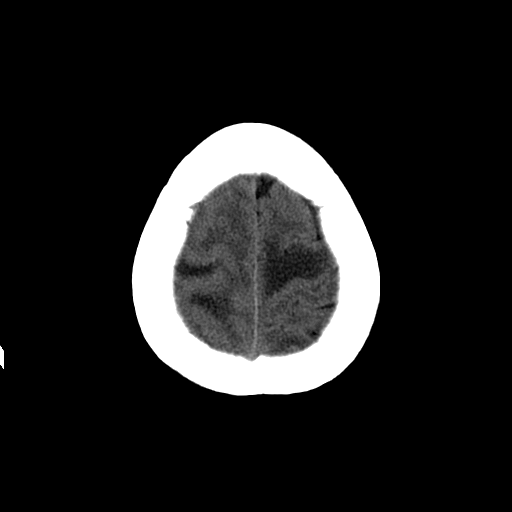
[im 26/31  brain]
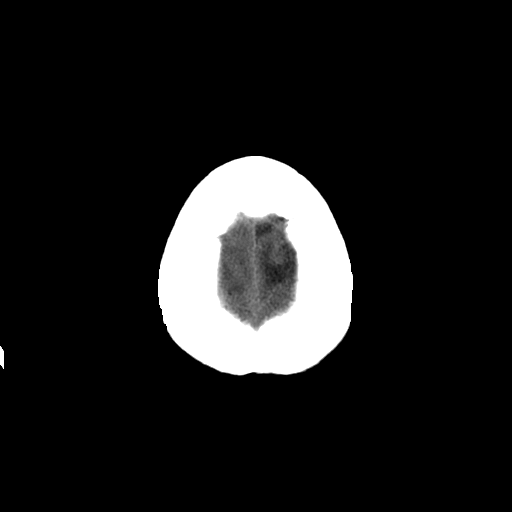
[im 28/31  brain]
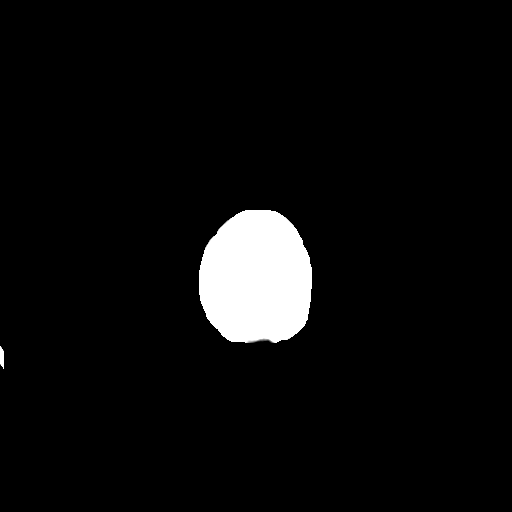
[im 28/31  bone]
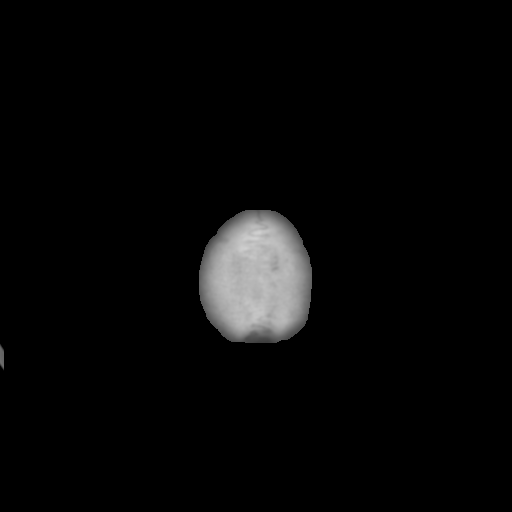

[Series 202: head w/o bone, idose (1) · axial · non-contrast · 0.49mm/px · z∈[+70,+90]mm · 2 of 31 slices shown]
[im 3/31  bone]
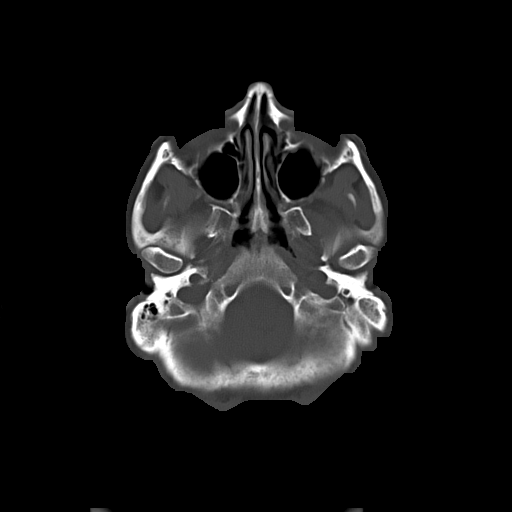
[im 7/31  bone]
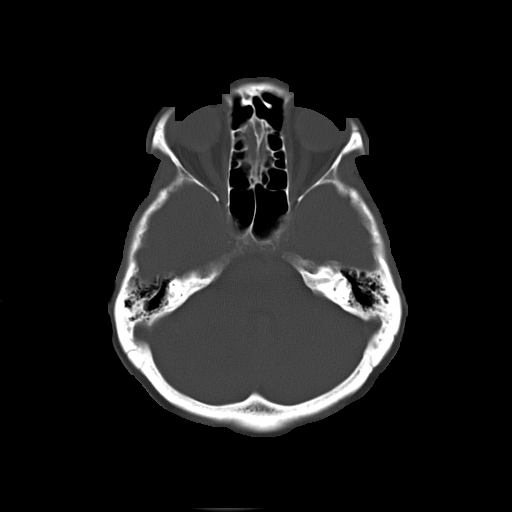

[15 of 30 positions shown; findings below may reference images not displayed]

FINDINGS: Unremarkable appearance of the calvarium with no displaced fracture
or aggressive lesion.

Trace fluid within the right mastoid air cells. Left mastoid air
cells are clear.

No significant paranasal sinus disease.

Unremarkable appearance of the bilateral orbits.

Geographic regions of hypodensity within the white matter the
bilateral hemispheres. On the right this is predominantly
temporoparietal, though it extends into the occipital lobe on the
right and there is a focal region in the right frontal white matter.
This edema contributes to mass effect and subtle midline shift
measuring approximately 11 mm at the foramen of [place].

There is a mixed density rounded lesion measuring 13 mm within the
right parietal lobe (image 17), as well as a smaller hyperdensity in
the right occipital lobe (image 16.)

To a lesser degree there is confluent hypodensity within the left
posterior frontal lobe white matter as well as the left high
parietal white matter.

Focal hyperdensity near the anterior limb of the right internal
capsule (image 13).

Heterogeneous focus in the right cerebellar hemisphere (image 7).
IMPRESSION: Evidence of multiple brain metastases of the supratentorial and
infratentorial brain, with associated vasogenic edema. The
right-sided edema is worse, and contributes to 1 cm of right to left
midline shift at the foramen of [place]. Further evaluation with
contrast MRI is recommended.

These results were called by telephone at the time of interpretation
on 02/25/2014 at [DATE] to Dr. GOUREN AONUMA , who verbally
acknowledged these results.

## 2016-03-14 IMAGING — MR MR HEAD WO/W CM
7 of 12 series · 22 of 48 positions shown · IV contrast (multihance)
Comparison: CT head without contrast 02/25/2014. MRI brain
07/15/2013.

CLINICAL DATA: Brain metastases.  Stage IV breast cancer.

EXAM:
MRI HEAD WITHOUT AND WITH CONTRAST
TECHNIQUE: Multiplanar, multiecho pulse sequences of the brain and surrounding
structures were obtained without and with intravenous contrast.
CONTRAST:  10mL MULTIHANCE GADOBENATE DIMEGLUMINE 529 MG/ML IV SOLN

[Series 2: FLAIR · sagittal · 3.0mm · 0.47mm/px · 3 of 38 slices shown (1 of 3)]
[im 1/38]
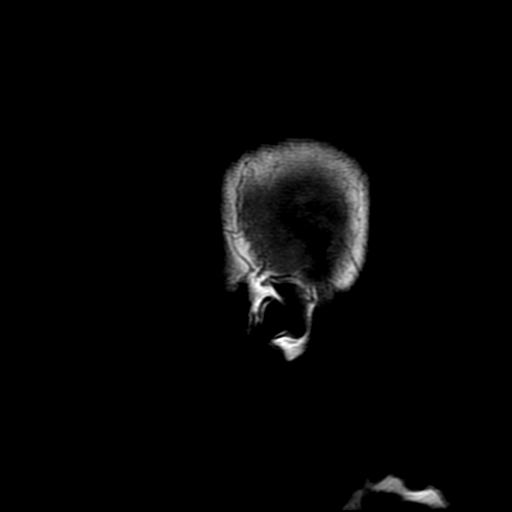
[im 19/38]
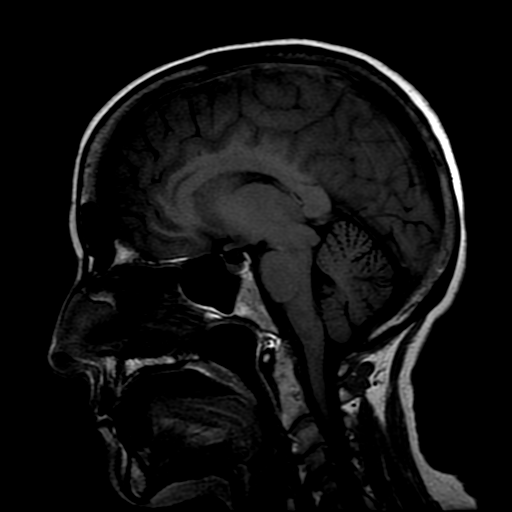
[im 38/38]
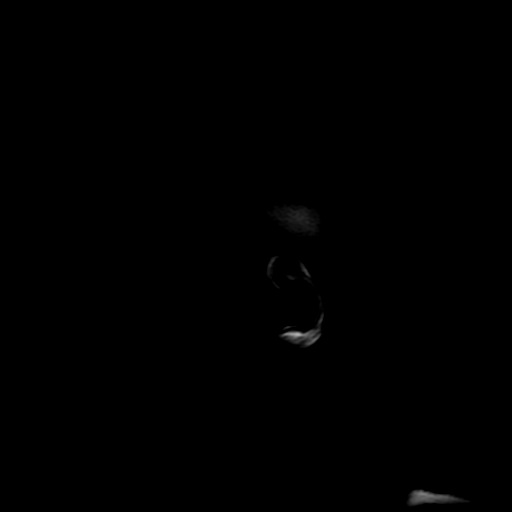

[Series 4: DWI · axial · 5.0mm · 1.09mm/px · z∈[-66,+88]mm · 4 of 58 slices shown (1 of 2)]
[im 1/58]
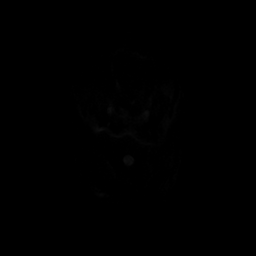
[im 20/58]
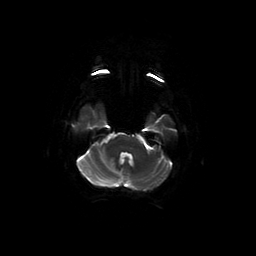
[im 39/58]
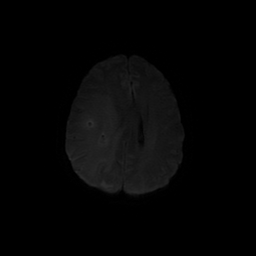
[im 58/58]
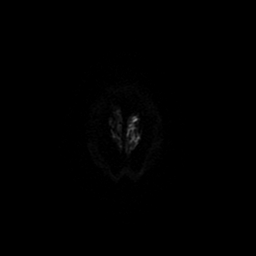

[Series 5: T2 · axial · 5.0mm · 0.43mm/px · z∈[-65,+85]mm · 2 of 26 slices shown (1 of 2)]
[im 1/26]
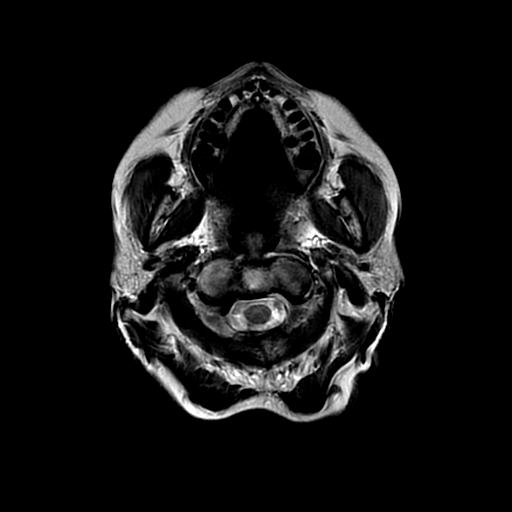
[im 26/26]
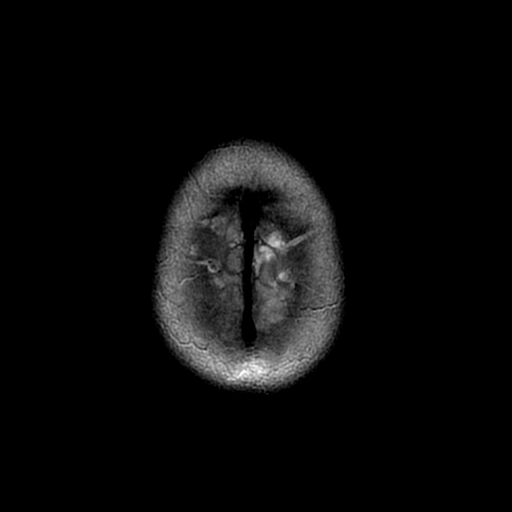

[Series 6: FLAIR · axial · 1.0mm · 0.47mm/px · z∈[-60,+96]mm · 6 of 79 slices shown (2 of 3)]
[im 1/79]
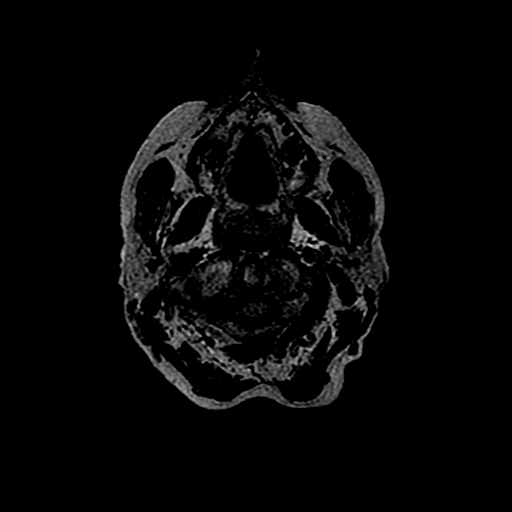
[im 16/79]
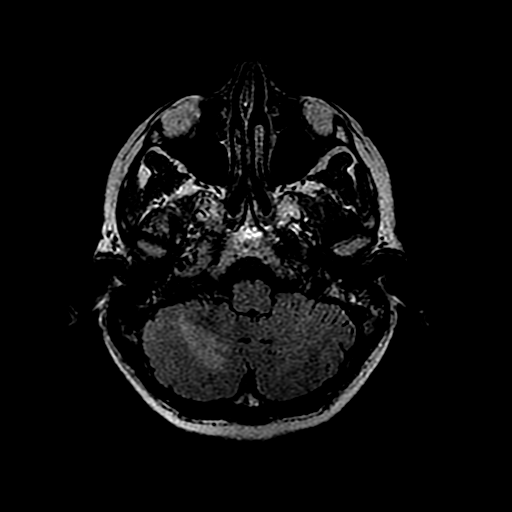
[im 32/79]
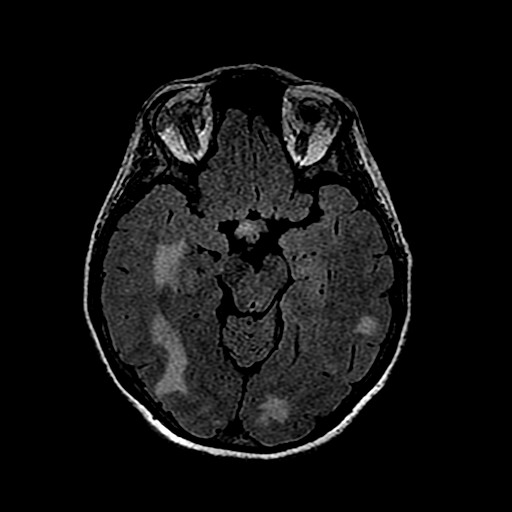
[im 47/79]
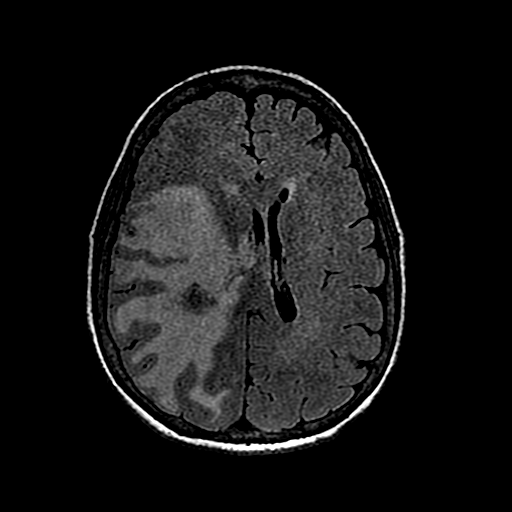
[im 63/79]
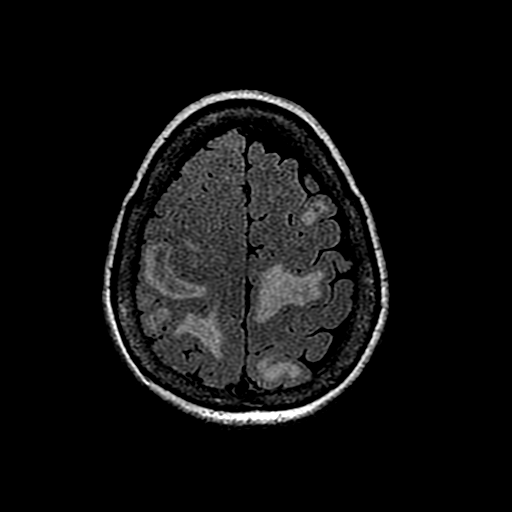
[im 79/79]
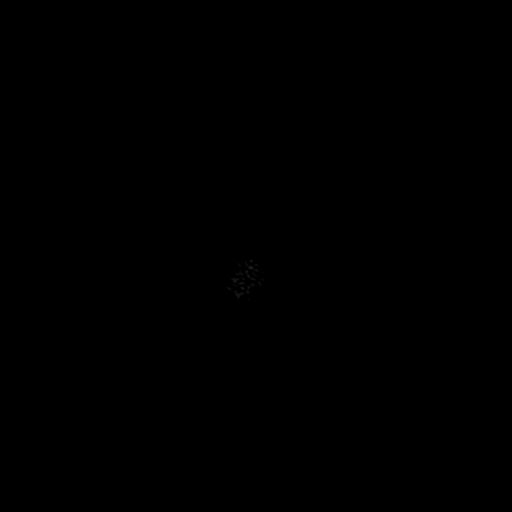

[Series 10: T2 · coronal · 5.0mm · 0.43mm/px · 2 of 29 slices shown (2 of 2)]
[im 1/29]
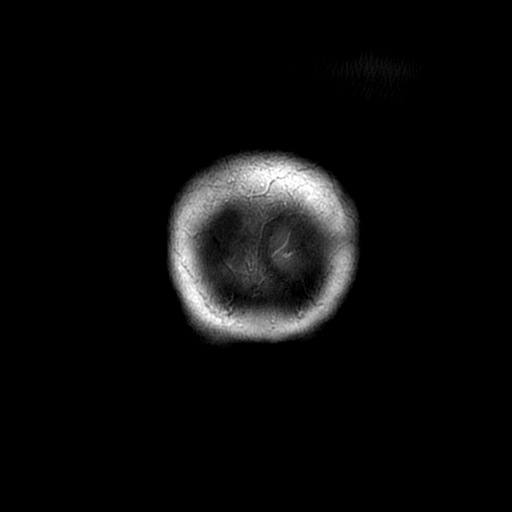
[im 29/29]
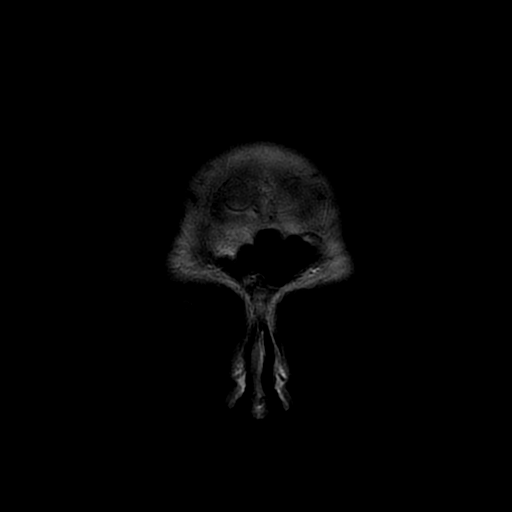

[Series 13: FLAIR · sagittal · 3.0mm · 0.47mm/px · 3 of 38 slices shown (3 of 3)]
[im 1/38]
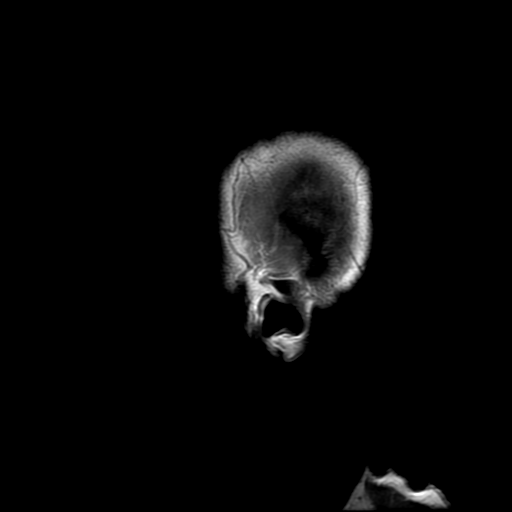
[im 19/38]
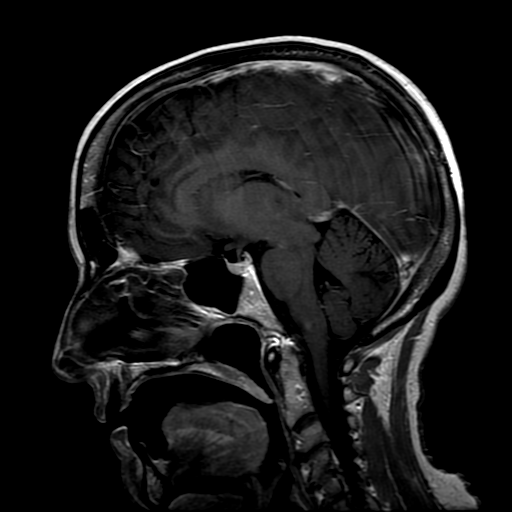
[im 38/38]
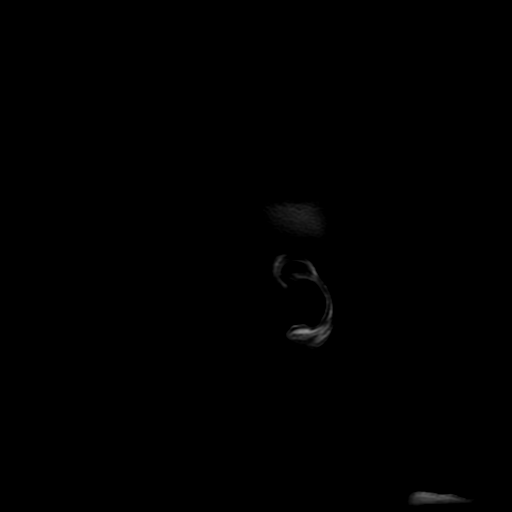

[Series 400: DWI · axial · 5.0mm · 1.09mm/px · z∈[-66,+88]mm · 2 of 29 slices shown (2 of 2)]
[im 1/29]
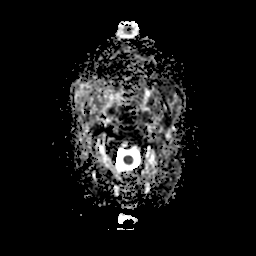
[im 29/29]
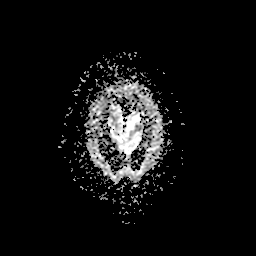

[22 of 48 positions shown; findings below may reference images not displayed]

FINDINGS: Multiple enhancing mass lesions are present bilaterally, more
prominent right than left. Peripheral restricted diffusion is
evident within multiple lesions.

A right cerebellar lesion measures 16 x 18 mm with significant
surrounding edema.

A 5 mm left temporal lobe lesion is present on image 11. Bilateral
occipital lobe lesions are present on image 12. The lesion on the
left measures 6.5 x 8.0 mm lesion on the right measures 3 mm. The
posterior right temporal lobe lesion on image 13 measures 15 x 12
mm. A posterior left frontal lobe lesion measures 18 x 21 x 18 mm. A
right parietal lesion on image 15 measures 6 mm. Additional lesions
in the right frontal and right parietal lobe are contained within an
an area of confluent T2 hyperintensity in the right frontal and
parietal lobe. A more anterior right frontal lobe lesion measures 6
mm on image 19. A high posterior left frontal lobe lesion measures
5.5 mm with surrounding edema. A more inferior left frontal lobe
lesion measures 5.5 mm on image 21. And 6.5 mm lesion is present in
the left parietal lobe. A peripheral smaller left parietal lesion is
present on image 19. A punctate lesion is present within the left
lentiform nucleus.

The vasogenic edema results in significant midline shift from the
left to the right measuring 10 mm at the foramen of [place]. The basal
cisterns are intact.

Flow is present in the major intracranial arteries. The globes and
orbits are intact. The paranasal sinuses are clear. There is some
fluid in the mastoid air cells bilaterally, left greater than right.
IMPRESSION: 1. Multiple enhancing mass lesions scattered throughout the brain.
The largest concentration is in the posterior right frontal and
right parietal lobe.
2. The largest lesion is in the posterior right frontal lobe,
measuring 18 x 21 x 18 mm.
3. A more lateral right occipital lesion measures 17 x 15 x 12 mm.
4. High posterior left frontal lobe measuring 5.5 mm.
5. 18 mm right cerebellar lesion.
6. Significant midline shift measures 5 mm at the foramen of [place].
